# Patient Record
Sex: Male | Born: 1940
Health system: Southern US, Community
[De-identification: ages and names within clinical notes are randomized; demographics above are authoritative.]

## PROBLEM LIST (undated history)

## (undated) ENCOUNTER — Emergency Department (HOSPITAL_COMMUNITY): Admission: EM | Payer: Medicare HMO | Source: Home / Self Care

## (undated) DIAGNOSIS — I2510 Atherosclerotic heart disease of native coronary artery without angina pectoris: Secondary | ICD-10-CM

## (undated) DIAGNOSIS — I4821 Permanent atrial fibrillation: Secondary | ICD-10-CM

## (undated) DIAGNOSIS — I428 Other cardiomyopathies: Secondary | ICD-10-CM

## (undated) DIAGNOSIS — F419 Anxiety disorder, unspecified: Secondary | ICD-10-CM

## (undated) DIAGNOSIS — I340 Nonrheumatic mitral (valve) insufficiency: Secondary | ICD-10-CM

## (undated) DIAGNOSIS — G4730 Sleep apnea, unspecified: Secondary | ICD-10-CM

## (undated) DIAGNOSIS — Z720 Tobacco use: Secondary | ICD-10-CM

## (undated) DIAGNOSIS — Z87442 Personal history of urinary calculi: Secondary | ICD-10-CM

## (undated) DIAGNOSIS — J45909 Unspecified asthma, uncomplicated: Secondary | ICD-10-CM

## (undated) DIAGNOSIS — R001 Bradycardia, unspecified: Secondary | ICD-10-CM

## (undated) DIAGNOSIS — Z8679 Personal history of other diseases of the circulatory system: Secondary | ICD-10-CM

## (undated) DIAGNOSIS — E079 Disorder of thyroid, unspecified: Secondary | ICD-10-CM

## (undated) DIAGNOSIS — N200 Calculus of kidney: Secondary | ICD-10-CM

## (undated) DIAGNOSIS — Z9889 Other specified postprocedural states: Secondary | ICD-10-CM

## (undated) DIAGNOSIS — R51 Headache: Secondary | ICD-10-CM

## (undated) DIAGNOSIS — I10 Essential (primary) hypertension: Secondary | ICD-10-CM

## (undated) DIAGNOSIS — Z86718 Personal history of other venous thrombosis and embolism: Secondary | ICD-10-CM

## (undated) DIAGNOSIS — I77819 Aortic ectasia, unspecified site: Secondary | ICD-10-CM

## (undated) DIAGNOSIS — Z8719 Personal history of other diseases of the digestive system: Secondary | ICD-10-CM

## (undated) DIAGNOSIS — I509 Heart failure, unspecified: Secondary | ICD-10-CM

## (undated) DIAGNOSIS — M1990 Unspecified osteoarthritis, unspecified site: Secondary | ICD-10-CM

## (undated) DIAGNOSIS — IMO0002 Reserved for concepts with insufficient information to code with codable children: Secondary | ICD-10-CM

## (undated) DIAGNOSIS — R519 Headache, unspecified: Secondary | ICD-10-CM

## (undated) DIAGNOSIS — I712 Thoracic aortic aneurysm, without rupture: Secondary | ICD-10-CM

## (undated) DIAGNOSIS — I7121 Aneurysm of the ascending aorta, without rupture: Secondary | ICD-10-CM

## (undated) DIAGNOSIS — I639 Cerebral infarction, unspecified: Secondary | ICD-10-CM

## (undated) DIAGNOSIS — G459 Transient cerebral ischemic attack, unspecified: Secondary | ICD-10-CM

## (undated) DIAGNOSIS — M797 Fibromyalgia: Secondary | ICD-10-CM

## (undated) DIAGNOSIS — J449 Chronic obstructive pulmonary disease, unspecified: Secondary | ICD-10-CM

## (undated) DIAGNOSIS — K219 Gastro-esophageal reflux disease without esophagitis: Secondary | ICD-10-CM

## (undated) DIAGNOSIS — I5022 Chronic systolic (congestive) heart failure: Secondary | ICD-10-CM

## (undated) DIAGNOSIS — I480 Paroxysmal atrial fibrillation: Secondary | ICD-10-CM

## (undated) HISTORY — DX: Other cardiomyopathies: I42.8

## (undated) HISTORY — DX: Personal history of other venous thrombosis and embolism: Z86.718

## (undated) HISTORY — DX: Thoracic aortic aneurysm, without rupture: I71.2

## (undated) HISTORY — DX: Unspecified asthma, uncomplicated: J45.909

## (undated) HISTORY — DX: Sleep apnea, unspecified: G47.30

## (undated) HISTORY — DX: Disorder of thyroid, unspecified: E07.9

## (undated) HISTORY — DX: Chronic obstructive pulmonary disease, unspecified: J44.9

## (undated) HISTORY — DX: Paroxysmal atrial fibrillation: I48.0

## (undated) HISTORY — DX: Reserved for concepts with insufficient information to code with codable children: IMO0002

## (undated) HISTORY — DX: Permanent atrial fibrillation: I48.21

## (undated) HISTORY — PX: COLONOSCOPY: SHX5424

## (undated) HISTORY — DX: Transient cerebral ischemic attack, unspecified: G45.9

## (undated) HISTORY — PX: ANKLE SURGERY: SHX546

## (undated) HISTORY — DX: Nonrheumatic mitral (valve) insufficiency: I34.0

## (undated) HISTORY — DX: Atherosclerotic heart disease of native coronary artery without angina pectoris: I25.10

## (undated) HISTORY — DX: Headache: R51

## (undated) HISTORY — DX: Aneurysm of the ascending aorta, without rupture: I71.21

## (undated) HISTORY — DX: Chronic systolic (congestive) heart failure: I50.22

## (undated) HISTORY — PX: KNEE SURGERY: SHX244

## (undated) HISTORY — PX: SINUS EXPLORATION: SHX5214

## (undated) HISTORY — DX: Bradycardia, unspecified: R00.1

## (undated) HISTORY — DX: Headache, unspecified: R51.9

## (undated) HISTORY — DX: Tobacco use: Z72.0

## (undated) HISTORY — DX: Aortic ectasia, unspecified site: I77.819

---

## 2004-12-01 ENCOUNTER — Ambulatory Visit: Payer: Self-pay | Admitting: Pain Medicine

## 2005-02-18 ENCOUNTER — Ambulatory Visit: Payer: Self-pay | Admitting: Pain Medicine

## 2005-03-10 ENCOUNTER — Inpatient Hospital Stay: Payer: Self-pay | Admitting: General Surgery

## 2005-04-13 ENCOUNTER — Ambulatory Visit: Payer: Self-pay | Admitting: Pain Medicine

## 2005-07-06 ENCOUNTER — Ambulatory Visit: Payer: Self-pay | Admitting: Pain Medicine

## 2005-09-28 ENCOUNTER — Ambulatory Visit: Payer: Self-pay | Admitting: Pain Medicine

## 2006-01-13 ENCOUNTER — Ambulatory Visit: Payer: Self-pay | Admitting: Pain Medicine

## 2007-04-13 ENCOUNTER — Ambulatory Visit: Payer: Self-pay | Admitting: Specialist

## 2009-10-26 ENCOUNTER — Emergency Department: Payer: Self-pay | Admitting: Emergency Medicine

## 2010-06-06 ENCOUNTER — Inpatient Hospital Stay: Payer: Self-pay | Admitting: Internal Medicine

## 2010-12-27 ENCOUNTER — Emergency Department: Payer: Self-pay | Admitting: Emergency Medicine

## 2011-01-02 ENCOUNTER — Inpatient Hospital Stay: Payer: Self-pay | Admitting: Internal Medicine

## 2011-01-03 ENCOUNTER — Encounter: Payer: Self-pay | Admitting: Cardiovascular Disease

## 2011-01-04 ENCOUNTER — Encounter: Payer: Self-pay | Admitting: Cardiovascular Disease

## 2011-01-04 DIAGNOSIS — R079 Chest pain, unspecified: Secondary | ICD-10-CM | POA: Insufficient documentation

## 2011-01-04 DIAGNOSIS — R0789 Other chest pain: Secondary | ICD-10-CM | POA: Insufficient documentation

## 2011-01-05 ENCOUNTER — Encounter: Payer: Self-pay | Admitting: Cardiovascular Disease

## 2011-01-07 ENCOUNTER — Ambulatory Visit: Admit: 2011-01-07 | Payer: Self-pay

## 2011-01-07 ENCOUNTER — Ambulatory Visit: Admit: 2011-01-07 | Payer: Self-pay | Admitting: Physician Assistant

## 2011-01-07 ENCOUNTER — Ambulatory Visit: Admit: 2011-01-07 | Payer: Self-pay | Admitting: Cardiovascular Disease

## 2011-01-14 ENCOUNTER — Ambulatory Visit: Admit: 2011-01-14 | Payer: Self-pay

## 2011-01-21 NOTE — Miscellaneous (Signed)
  Clinical Lists Changes  Problems: Added new problem of CHEST PAIN UNSPECIFIED (ICD-786.50) Orders: Added new Referral order of Treadmill (Treadmill) - Signed

## 2011-01-21 NOTE — Miscellaneous (Addendum)
Summary: Myoview order  Clinical Lists Changes  Orders: Added new Referral order of Nuclear Stress Test (Nuc Stress Test) - Signed  Appended Document: Myoview order pt cancelled myoview.

## 2011-02-07 ENCOUNTER — Emergency Department: Payer: Self-pay | Admitting: Emergency Medicine

## 2011-02-09 ENCOUNTER — Ambulatory Visit: Payer: Self-pay | Admitting: Emergency Medicine

## 2011-02-11 LAB — PATHOLOGY REPORT

## 2011-04-21 ENCOUNTER — Encounter: Payer: Self-pay | Admitting: Cardiovascular Disease

## 2011-04-21 ENCOUNTER — Ambulatory Visit: Payer: Self-pay | Admitting: Cardiovascular Disease

## 2011-05-05 ENCOUNTER — Ambulatory Visit (INDEPENDENT_AMBULATORY_CARE_PROVIDER_SITE_OTHER): Payer: Medicare Other | Admitting: Cardiovascular Disease

## 2011-05-05 ENCOUNTER — Encounter: Payer: Self-pay | Admitting: Cardiovascular Disease

## 2011-05-05 DIAGNOSIS — I2789 Other specified pulmonary heart diseases: Secondary | ICD-10-CM

## 2011-05-05 DIAGNOSIS — I272 Pulmonary hypertension, unspecified: Secondary | ICD-10-CM | POA: Insufficient documentation

## 2011-05-05 DIAGNOSIS — R079 Chest pain, unspecified: Secondary | ICD-10-CM

## 2011-05-05 DIAGNOSIS — I340 Nonrheumatic mitral (valve) insufficiency: Secondary | ICD-10-CM | POA: Insufficient documentation

## 2011-05-05 DIAGNOSIS — I059 Rheumatic mitral valve disease, unspecified: Secondary | ICD-10-CM

## 2011-05-05 DIAGNOSIS — R0602 Shortness of breath: Secondary | ICD-10-CM | POA: Insufficient documentation

## 2011-05-05 DIAGNOSIS — I341 Nonrheumatic mitral (valve) prolapse: Secondary | ICD-10-CM | POA: Insufficient documentation

## 2011-05-05 DIAGNOSIS — J449 Chronic obstructive pulmonary disease, unspecified: Secondary | ICD-10-CM

## 2011-05-05 MED ORDER — ISOSORBIDE MONONITRATE ER 30 MG PO TB24: 15.0000 mg | ORAL_TABLET | Freq: Every day | ORAL | Status: DC

## 2011-05-05 MED ORDER — FUROSEMIDE 20 MG PO TABS: 20.0000 mg | ORAL_TABLET | Freq: Two times a day (BID) | ORAL | Status: DC

## 2011-05-05 MED ORDER — METOPROLOL TARTRATE 12.5 MG HALF TABLET: 12.5000 mg | ORAL_TABLET | Freq: Two times a day (BID) | ORAL | Status: DC

## 2011-05-05 NOTE — Assessment & Plan Note (Addendum)
He has had several episodesof severe SOB consistent. These are concerning for flash pulmonary edema from his mitral regurg. He is not currently on lasix or any medical management. We will start lasix BID with metoprolol and low dose imdur. We have suggested we check a BMP next week.

## 2011-05-05 NOTE — Assessment & Plan Note (Signed)
His echo shows at least moderate pulmonary HTN, if not moderate to severely elevated RVSP. This could be from his COPD or underlying valve disease. He will start lasix as detailed.

## 2011-05-05 NOTE — Progress Notes (Signed)
Patient ID: Michael Delacruz, male    DOB: 08-12-1941, 70 y.o.   MRN: [id number]  HPI Comments: Michael Delacruz is a 70 year old gentleman with a reported history of rheumatic fever as a child in his late teenage years, a murmur for several decades, long smoking history for 50 years with underlying COPD who is not on medications currently, with worsening shortness of breath this year, severe mitral valve regurgitation, pulmonary hypertension on echocardiogram who presents for second opinion.  He states that he is scheduled for surgery in several days time at [facility]. He is very nervous about the procedure as the details of the surgery were very nonspecific. He was told by the surgical team that he was uncertain whether he minimal surgical approach could be used versus a sternotomy. He would like a more definitive answer for proceeding with surgery.  He's had several episodes of severe shortness of breath. One was in April and the other several weeks prior to that. He has had other episodes of more moderate shortness of breath. These episodes have been worse over the past year. He reports having very mild lower extremity edema. No significant cough. He stopped smoking some time ago.  He had a cardiac catheterization by Dr. Park Breed that showed severe mitral valve regurgitation, 30% mid and 30% proximal LAD disease, 50% mid circumflex disease ejection fraction 50%.  Echocardiogram read by myself detailed ejection fraction 55%, severe acentric mitral valve regurgitation with mitral valve prolapse, elevated right ventricular systolic pressures concerning for moderate pulmonary hypertension ( low tricuspid valve regurgitation jet was not well measured)  EKG shows normal sinus rhythm with rate 66 beats per minute with LVH     Review of Systems  Constitutional: Negative.   HENT: Negative.   Eyes: Negative.   Respiratory: Positive for shortness of breath.   Cardiovascular: Positive for leg swelling.    Gastrointestinal: Negative.   Musculoskeletal: Negative.   Skin: Negative.   Neurological: Negative.   Hematological: Negative.   Psychiatric/Behavioral: Negative.   All other systems reviewed and are negative.   BP 122/90  Pulse 63  Ht 6' (1.829 m)  Wt 206 lb (93.441 kg)  BMI 27.94 kg/m2   Physical Exam  Nursing note and vitals reviewed. Constitutional: He is oriented to person, place, and time. He appears well-developed and well-nourished.  HENT:  Head: Normocephalic.  Nose: Nose normal.  Mouth/Throat: Oropharynx is clear and moist.  Eyes: Conjunctivae are normal. Pupils are equal, round, and reactive to light.  Neck: Normal range of motion. Neck supple. No JVD present.  Cardiovascular: Normal rate, regular rhythm, S1 normal, S2 normal and intact distal pulses.  Exam reveals no gallop and no friction rub.   Murmur heard.  Systolic murmur is present with a grade of 3/6  Pulmonary/Chest: Effort normal and breath sounds normal. No respiratory distress. He has no wheezes. He has no rales. He exhibits no tenderness.  Abdominal: Soft. Bowel sounds are normal. He exhibits no distension. There is no tenderness.  Musculoskeletal: Normal range of motion. He exhibits no edema and no tenderness.  Lymphadenopathy:    He has no cervical adenopathy.  Neurological: He is alert and oriented to person, place, and time. Coordination normal.  Skin: Skin is warm and dry. No rash noted. No erythema.  Psychiatric: He has a normal mood and affect. His behavior is normal. Judgment and thought content normal.           Assessment and Plan

## 2011-05-05 NOTE — Assessment & Plan Note (Addendum)
He does have severe COPD, has smoked for 50 years. He is not currently on any inhalers though could benefit from additional workup.

## 2011-05-05 NOTE — Patient Instructions (Signed)
START Metoprolol 12.5 mg twice daily. START Lasix 20mg  twice daily. DECREASE Fluid intake. START eating potassium rich foods (Bananas, citrus fruits) START Isosorbide 30mg  1/2 tablet daily. Your physician recommends that you return for lab work in: 1 week (BMP) Your physician recommends that you schedule a follow-up appointment in: 2 weeks

## 2011-05-05 NOTE — Assessment & Plan Note (Signed)
Echo in January suggested severe MR. Cardiac cath by Dr. Park Breed suggest severe MR. I have suggested as he is reluctant at this time to proceed with surgery without knowing the pathology of the valve and which surgical approach will be used, that he have a TEE. This could be done at [facility] or [facility] I have asked him to talk with Dr. Silvestre Mesi. In the meantime, I will try to minimize his episodes of SOB by medical management.

## 2011-05-12 ENCOUNTER — Other Ambulatory Visit (INDEPENDENT_AMBULATORY_CARE_PROVIDER_SITE_OTHER): Payer: Medicare Other | Admitting: *Deleted

## 2011-05-12 DIAGNOSIS — R079 Chest pain, unspecified: Secondary | ICD-10-CM

## 2011-05-12 LAB — BASIC METABOLIC PANEL
BUN: 14 mg/dL (ref 6–23)
CO2: 21 mEq/L (ref 19–32)
Calcium: 9.8 mg/dL (ref 8.4–10.5)
Chloride: 104 mEq/L (ref 96–112)
Creat: 1.19 mg/dL (ref 0.40–1.50)
Glucose, Bld: 130 mg/dL — ABNORMAL HIGH (ref 70–99)
Potassium: 3.9 mEq/L (ref 3.5–5.3)
Sodium: 139 mEq/L (ref 135–145)

## 2011-05-18 ENCOUNTER — Encounter: Payer: Self-pay | Admitting: Cardiovascular Disease

## 2011-05-19 ENCOUNTER — Encounter: Payer: Self-pay | Admitting: Cardiovascular Disease

## 2011-05-19 ENCOUNTER — Ambulatory Visit (INDEPENDENT_AMBULATORY_CARE_PROVIDER_SITE_OTHER): Payer: Medicare Other | Admitting: Cardiovascular Disease

## 2011-05-19 DIAGNOSIS — R0602 Shortness of breath: Secondary | ICD-10-CM

## 2011-05-19 DIAGNOSIS — J449 Chronic obstructive pulmonary disease, unspecified: Secondary | ICD-10-CM

## 2011-05-19 DIAGNOSIS — I059 Rheumatic mitral valve disease, unspecified: Secondary | ICD-10-CM

## 2011-05-19 DIAGNOSIS — I272 Pulmonary hypertension, unspecified: Secondary | ICD-10-CM

## 2011-05-19 DIAGNOSIS — I2789 Other specified pulmonary heart diseases: Secondary | ICD-10-CM

## 2011-05-19 DIAGNOSIS — I340 Nonrheumatic mitral (valve) insufficiency: Secondary | ICD-10-CM

## 2011-05-19 DIAGNOSIS — R42 Dizziness and giddiness: Secondary | ICD-10-CM

## 2011-05-19 DIAGNOSIS — J4489 Other specified chronic obstructive pulmonary disease: Secondary | ICD-10-CM

## 2011-05-19 DIAGNOSIS — R079 Chest pain, unspecified: Secondary | ICD-10-CM

## 2011-05-19 DIAGNOSIS — I341 Nonrheumatic mitral (valve) prolapse: Secondary | ICD-10-CM

## 2011-05-19 NOTE — Patient Instructions (Signed)
You are doing well. Hold your isosorbide for now Monitor your weight. If you get to 195 pounds, call to check your kidney function Please call us if you have new issues that need to be addressed before your next appt.  We will call you for a follow up Appt. In two weeks

## 2011-05-19 NOTE — Progress Notes (Signed)
   Patient ID: Michael Delacruz, male    DOB: [birth year], 70 y.o.   MRN: [id number]  HPI Comments: Michael Delacruz is a 70 year old gentleman with a reported history of rheumatic fever as a child in his late teenage years, a murmur for several decades, long smoking history for 50 years with underlying COPD who is not on medications currently, with worsening shortness of breath this year, severe mitral valve regurgitation, pulmonary hypertension on echocardiogram who presents for Routine followup.  On his last clinic visit, we started medical management for his significant mitral valve regurgitation, COPD and pulmonary hypertension. He was having episodes of what sounded like flash pulmonary edema. Currently, after Lasix, metoprolol, Imdur, he feels better with no further episodes of significant shortness of breath. He reports walking on a regular basis, sometimes over 2 miles with no significant problems. He does have occasional episodes of profound fatigue and occasional dizziness in the morning after he takes his medications. Sometimes he takes Lasix b.i.d. And sometimes t.i.d. Depending on his breathing.   No significant cough. He stopped smoking some time ago.  He had a cardiac catheterization  that showed severe mitral valve regurgitation, 30% mid and 30% proximal LAD disease, 50% mid circumflex disease ejection fraction 50%.  Echocardiogram  with ejection fraction 55%, severe eccentric mitral valve regurgitation with mitral valve prolapse, elevated right ventricular systolic pressures concerning for moderate pulmonary hypertension ( low tricuspid valve regurgitation jet was not well measured)  Old EKG shows normal sinus rhythm with rate 66 beats per minute with LVH      Review of Systems  Constitutional: Positive for fatigue.  HENT: Negative.   Eyes: Negative.   Respiratory: Positive for shortness of breath.   Cardiovascular: Negative.   Gastrointestinal: Negative.   Musculoskeletal: Negative.    Skin: Negative.   Neurological: Positive for dizziness and light-headedness.  Hematological: Negative.   Psychiatric/Behavioral: Negative.   All other systems reviewed and are negative.    BP 98/68  Pulse 76  Ht 6\' 1"  (1.854 m)  Wt 199 lb (90.266 kg)  BMI 26.25 kg/m2   Physical Exam  Nursing note and vitals reviewed. Constitutional: He is oriented to person, place, and time. He appears well-developed and well-nourished.  HENT:  Head: Normocephalic.  Nose: Nose normal.  Mouth/Throat: Oropharynx is clear and moist.  Eyes: Conjunctivae are normal. Pupils are equal, round, and reactive to light.  Neck: Normal range of motion. Neck supple. No JVD present.  Cardiovascular: Normal rate, regular rhythm, S1 normal, S2 normal and intact distal pulses.  Exam reveals no gallop and no friction rub.   Murmur heard.  Crescendo systolic murmur is present  Pulmonary/Chest: Effort normal and breath sounds normal. No respiratory distress. He has no wheezes. He has no rales. He exhibits no tenderness.  Abdominal: Soft. Bowel sounds are normal. He exhibits no distension. There is no tenderness.  Musculoskeletal: Normal range of motion. He exhibits no edema and no tenderness.  Lymphadenopathy:    He has no cervical adenopathy.  Neurological: He is alert and oriented to person, place, and time. Coordination normal.  Skin: Skin is warm and dry. No rash noted. No erythema.  Psychiatric: He has a normal mood and affect. His behavior is normal. Judgment and thought content normal.           Assessment and Plan

## 2011-05-19 NOTE — Assessment & Plan Note (Signed)
He does have a long history of smoking. His breathing has improved with Lasix. We will hold on any further management of his COPD at this time.

## 2011-05-19 NOTE — Assessment & Plan Note (Signed)
Recent episodes of dizziness after taking his medications in the morning is likely secondary to hypotension. He will hold his isosorbide for now and contact us next week by phone to let us know if his symptoms have improved.

## 2011-05-19 NOTE — Assessment & Plan Note (Signed)
Symptoms of shortness of breath and edema have improved on Lasix and beta blocker. We have suggested he continue on b.i.d. Or t.i.d. Lasix depending on his breathing. He will watch his weight. If he drops his weight to 195 pounds, we will check a basic panel.

## 2011-05-19 NOTE — Assessment & Plan Note (Signed)
Etiology of pulmonary hypertension likely multifactorial. He has fluid overload from his valve regurgitation and underlying COPD.

## 2011-05-31 ENCOUNTER — Ambulatory Visit (INDEPENDENT_AMBULATORY_CARE_PROVIDER_SITE_OTHER): Payer: Medicare Other | Admitting: Cardiovascular Disease

## 2011-05-31 ENCOUNTER — Encounter: Payer: Self-pay | Admitting: Cardiovascular Disease

## 2011-05-31 DIAGNOSIS — I272 Pulmonary hypertension, unspecified: Secondary | ICD-10-CM

## 2011-05-31 DIAGNOSIS — I059 Rheumatic mitral valve disease, unspecified: Secondary | ICD-10-CM

## 2011-05-31 DIAGNOSIS — R0602 Shortness of breath: Secondary | ICD-10-CM

## 2011-05-31 DIAGNOSIS — I341 Nonrheumatic mitral (valve) prolapse: Secondary | ICD-10-CM

## 2011-05-31 DIAGNOSIS — I2789 Other specified pulmonary heart diseases: Secondary | ICD-10-CM

## 2011-05-31 DIAGNOSIS — J449 Chronic obstructive pulmonary disease, unspecified: Secondary | ICD-10-CM

## 2011-05-31 DIAGNOSIS — R079 Chest pain, unspecified: Secondary | ICD-10-CM

## 2011-05-31 DIAGNOSIS — R42 Dizziness and giddiness: Secondary | ICD-10-CM

## 2011-05-31 DIAGNOSIS — I340 Nonrheumatic mitral (valve) insufficiency: Secondary | ICD-10-CM

## 2011-05-31 LAB — BASIC METABOLIC PANEL
BUN: 13 mg/dL (ref 6–23)
CO2: 26 mEq/L (ref 19–32)
Calcium: 10 mg/dL (ref 8.4–10.5)
Chloride: 104 mEq/L (ref 96–112)
Creat: 1.1 mg/dL (ref 0.50–1.35)
Glucose, Bld: 102 mg/dL — ABNORMAL HIGH (ref 70–99)
Potassium: 4.2 mEq/L (ref 3.5–5.3)
Sodium: 142 mEq/L (ref 135–145)

## 2011-05-31 MED ORDER — ENALAPRIL MALEATE 5 MG PO TABS: 5.0000 mg | ORAL_TABLET | Freq: Every day | ORAL | Status: DC

## 2011-05-31 NOTE — Patient Instructions (Addendum)
You are doing well. Please start enalapril 2.5 daily, increase to 5 mg daily after two week.  Please call us if you have new issues that need to be addressed before your next appt.  We will call you for a follow up Appt. In 3 to 4 weeks

## 2011-05-31 NOTE — Assessment & Plan Note (Signed)
50 year smoking hx, currently not on inhalers.

## 2011-05-31 NOTE — Progress Notes (Signed)
   Patient ID: Michael Delacruz, male    DOB: Feb 01, 1941, 70 y.o.   MRN: [id number]  HPI Comments: [name] is a 70 year old gentleman with a reported history of rheumatic fever as a child in his late teenage years, a murmur for several decades, long smoking history for 50 years with underlying COPD, with worsening shortness of breath this year, severe mitral valve regurgitation, pulmonary hypertension on echocardiogram who presents for Routine followup.  When he first presented to the clinic, he was having episodes of what sounded like flash pulmonary edema. Currently, after Lasix, metoprolol, he feels better with no further episodes of significant shortness of breath. His blood pressure dropped on imdur and he had fatigue. He reports walking on a regular basis, sometimes over 3 miles with no significant problems.  Sometimes he takes Lasix b.i.d. And sometimes t.i.d. Depending on his breathing. He has rare episodes of chest pain that he believes in a hiatal hernia. He has been lifting weights and walking several times a week.  No significant cough. He stopped smoking some time ago.  He had a cardiac catheterization  that showed severe mitral valve regurgitation, 30% mid and 30% proximal LAD disease, 50% mid circumflex disease ejection fraction 50%.  Echocardiogram  with ejection fraction 55%, severe eccentric mitral valve regurgitation with mitral valve prolapse, elevated right ventricular systolic pressures concerning for moderate pulmonary hypertension (  tricuspid valve regurgitation jet was not well measured)  Old EKG shows normal sinus rhythm with rate 66 beats per minute with LVH      Review of Systems  Constitutional: Negative.   HENT: Negative.   Eyes: Negative.   Respiratory: Positive for shortness of breath.   Cardiovascular: Positive for chest pain.  Gastrointestinal: Negative.   Musculoskeletal: Negative.   Skin: Negative.   Neurological: Negative.   Hematological: Negative.     Psychiatric/Behavioral: Negative.   All other systems reviewed and are negative.    BP 119/76  Pulse 52  Ht 6\' 1"  (1.854 m)  Wt 201 lb (91.173 kg)  BMI 26.52 kg/m2   Physical Exam  Nursing note and vitals reviewed. Constitutional: He is oriented to person, place, and time. He appears well-developed and well-nourished.  HENT:  Head: Normocephalic.  Nose: Nose normal.  Mouth/Throat: Oropharynx is clear and moist.  Eyes: Conjunctivae are normal. Pupils are equal, round, and reactive to light.  Neck: Normal range of motion. Neck supple. No JVD present.  Cardiovascular: Normal rate, regular rhythm, S1 normal, S2 normal and intact distal pulses.  Exam reveals no gallop and no friction rub.   Murmur heard.  Crescendo systolic murmur is present with a grade of 3/6  Pulmonary/Chest: Effort normal and breath sounds normal. No respiratory distress. He has no wheezes. He has no rales. He exhibits no tenderness.  Abdominal: Soft. Bowel sounds are normal. He exhibits no distension. There is no tenderness.  Musculoskeletal: Normal range of motion. He exhibits no edema and no tenderness.  Lymphadenopathy:    He has no cervical adenopathy.  Neurological: He is alert and oriented to person, place, and time. Coordination normal.  Skin: Skin is warm and dry. No rash noted. No erythema.  Psychiatric: He has a normal mood and affect. His behavior is normal. Judgment and thought content normal.           Assessment and Plan

## 2011-05-31 NOTE — Assessment & Plan Note (Signed)
He is feeling much better on lasix and metoprolol. He is exercising on a regular basis. He has a script for enalapril from Dr. Welton Flakes. We have suggested that he could start this, with 2.5 mg for a week or two before titrating to 5 mg total.

## 2011-05-31 NOTE — Assessment & Plan Note (Signed)
Chest pain episodes are likely noncardiac as he has had a recent cardiac cath with noncritical disease. He reports having a hiatal hernia. Symptoms come on at rest.

## 2011-05-31 NOTE — Assessment & Plan Note (Signed)
SOB is likely multifactorial, including COPD and underlying mitral valve regurgitation. Symptoms are mild. He does not want inhalers.

## 2011-06-02 ENCOUNTER — Ambulatory Visit: Payer: Medicare Other | Admitting: Cardiovascular Disease

## 2011-06-03 ENCOUNTER — Encounter: Payer: Self-pay | Admitting: *Deleted

## 2011-06-07 ENCOUNTER — Encounter: Payer: Self-pay | Admitting: Cardiovascular Disease

## 2011-06-17 ENCOUNTER — Other Ambulatory Visit: Payer: Self-pay | Admitting: Cardiovascular Disease

## 2011-06-17 MED ORDER — FUROSEMIDE 20 MG PO TABS: 20.0000 mg | ORAL_TABLET | Freq: Two times a day (BID) | ORAL | Status: DC

## 2011-06-17 MED ORDER — METOPROLOL TARTRATE 12.5 MG HALF TABLET: 12.5000 mg | ORAL_TABLET | Freq: Two times a day (BID) | ORAL | Status: DC

## 2011-06-17 MED ORDER — ENALAPRIL MALEATE 5 MG PO TABS: 5.0000 mg | ORAL_TABLET | Freq: Every day | ORAL | Status: DC

## 2011-06-17 NOTE — Telephone Encounter (Signed)
Only one Metoprolol tablet left.

## 2011-06-21 ENCOUNTER — Ambulatory Visit: Payer: Medicare Other | Admitting: Cardiovascular Disease

## 2011-07-02 ENCOUNTER — Encounter: Payer: Self-pay | Admitting: Cardiovascular Disease

## 2011-07-02 ENCOUNTER — Ambulatory Visit (INDEPENDENT_AMBULATORY_CARE_PROVIDER_SITE_OTHER): Payer: Medicare Other | Admitting: Cardiovascular Disease

## 2011-07-02 DIAGNOSIS — R079 Chest pain, unspecified: Secondary | ICD-10-CM

## 2011-07-02 DIAGNOSIS — R0602 Shortness of breath: Secondary | ICD-10-CM

## 2011-07-02 DIAGNOSIS — I059 Rheumatic mitral valve disease, unspecified: Secondary | ICD-10-CM

## 2011-07-02 DIAGNOSIS — J449 Chronic obstructive pulmonary disease, unspecified: Secondary | ICD-10-CM

## 2011-07-02 DIAGNOSIS — J4489 Other specified chronic obstructive pulmonary disease: Secondary | ICD-10-CM

## 2011-07-02 DIAGNOSIS — I341 Nonrheumatic mitral (valve) prolapse: Secondary | ICD-10-CM

## 2011-07-02 DIAGNOSIS — I340 Nonrheumatic mitral (valve) insufficiency: Secondary | ICD-10-CM

## 2011-07-02 NOTE — Progress Notes (Signed)
Patient ID: Michael Delacruz, male    DOB: Aug 18, 1941, 70 y.o.   MRN: [id number]  HPI Comments: Michael Delacruz is a 70 year old gentleman with a reported history of rheumatic fever as a child in his late teenage years, a murmur for several decades, long smoking history for 50 years with underlying COPD, with worsening shortness of breath this year, severe mitral valve regurgitation, pulmonary hypertension on echocardiogram who presents for Routine followup.  He reports that he is doing well. He denies any significant shortness of breath. He has been walking up to 3-4 miles at a time typically in the nighttime. He walks at a moderate pace. He continues to have occasional chest pain radiating to his left arm which he has had for years. He attributes this to hiatal hernia. Symptoms are typically resolved after drinking water. Overall he is very happy with his decision to delay surgery.  He has decreased his Lasix to 40 mg in the morning only from b.i.d.   He had a cardiac catheterization  that showed severe mitral valve regurgitation, 30% mid and 30% proximal LAD disease, 50% mid circumflex disease ejection fraction 50%.  Echocardiogram  with ejection fraction 55%, severe eccentric mitral valve regurgitation with mitral valve prolapse, elevated right ventricular systolic pressures concerning for moderate pulmonary hypertension (  tricuspid valve regurgitation jet was not well measured)  Old EKG shows normal sinus rhythm with rate 66 beats per minute with LVH   Outpatient Encounter Prescriptions as of 07/02/2011  Medication Sig Dispense Refill  . ALPRAZolam (XANAX) 0.25 MG tablet Take 0.25 mg by mouth 2 (two) times daily.       [name] aspirin 81 MG EC tablet Take 81 mg by mouth daily.        . clobetasol (TEMOVATE) 0.05 % cream as needed.      . enalapril (VASOTEC) 5 MG tablet Take 1 tablet (5 mg total) by mouth daily.  30 tablet  6  . furosemide (LASIX) 20 MG tablet Take 40 mg by mouth daily.        .  metoprolol tartrate (LOPRESSOR) 12.5 mg TABS Take 12.5 mg by mouth at bedtime.        [name] omeprazole (PRILOSEC) 20 MG capsule Take 20 mg by mouth every other day.           Review of Systems  Constitutional: Negative.   HENT: Negative.   Eyes: Negative.   Respiratory: Negative.   Cardiovascular: Negative.   Gastrointestinal: Negative.   Musculoskeletal: Negative.   Skin: Negative.   Neurological: Negative.   Hematological: Negative.   Psychiatric/Behavioral: Negative.   All other systems reviewed and are negative.    BP 101/67  Pulse 57  Ht 6\' 1"  (1.854 m)  Wt 200 lb (90.719 kg)  BMI 26.39 kg/m2   Physical Exam  Nursing note and vitals reviewed. Constitutional: He is oriented to person, place, and time. He appears well-developed and well-nourished.  HENT:  Head: Normocephalic.  Nose: Nose normal.  Mouth/Throat: Oropharynx is clear and moist.  Eyes: Conjunctivae are normal. Pupils are equal, round, and reactive to light.  Neck: Normal range of motion. Neck supple. No JVD present.  Cardiovascular: Normal rate, regular rhythm, S1 normal, S2 normal and intact distal pulses.  Exam reveals no gallop and no friction rub.   Murmur heard.  Crescendo systolic murmur is present with a grade of 2/6  Pulmonary/Chest: Effort normal and breath sounds normal. No respiratory distress. He has no wheezes. He has no rales.  He exhibits no tenderness.  Abdominal: Soft. Bowel sounds are normal. He exhibits no distension. There is no tenderness.  Musculoskeletal: Normal range of motion. He exhibits no edema and no tenderness.  Lymphadenopathy:    He has no cervical adenopathy.  Neurological: He is alert and oriented to person, place, and time. Coordination normal.  Skin: Skin is warm and dry. No rash noted. No erythema.  Psychiatric: He has a normal mood and affect. His behavior is normal. Judgment and thought content normal.           Assessment and Plan

## 2011-07-02 NOTE — Patient Instructions (Signed)
You are doing well. No medication changes were made. Please call us if you have new issues that need to be addressed before your next appt.  We will call you for a follow up Appt. In 6 months  

## 2011-07-03 NOTE — Assessment & Plan Note (Signed)
Shortness of breath has improved with diuresis and with his regular exercise. He may have underlying COPD.  Symptoms are mild. He does not want inhalers.

## 2011-07-03 NOTE — Assessment & Plan Note (Signed)
He is feeling much better on lasix and metoprolol. He is exercising on a regular basis.

## 2011-07-03 NOTE — Assessment & Plan Note (Signed)
Chest pain episodes are  noncardiac as he has had a recent cardiac cath with noncritical disease. He reports having a hiatal hernia. Symptoms come on at rest. Relieved with drinking water.

## 2011-07-03 NOTE — Assessment & Plan Note (Signed)
50 year smoking hx, currently not on inhalers.  

## 2011-07-26 ENCOUNTER — Telehealth: Payer: Self-pay | Admitting: *Deleted

## 2011-07-27 ENCOUNTER — Ambulatory Visit (INDEPENDENT_AMBULATORY_CARE_PROVIDER_SITE_OTHER): Payer: Medicare Other | Admitting: *Deleted

## 2011-07-27 ENCOUNTER — Encounter: Payer: Self-pay | Admitting: *Deleted

## 2011-07-27 DIAGNOSIS — I341 Nonrheumatic mitral (valve) prolapse: Secondary | ICD-10-CM

## 2011-07-27 DIAGNOSIS — I059 Rheumatic mitral valve disease, unspecified: Secondary | ICD-10-CM

## 2011-07-27 DIAGNOSIS — R079 Chest pain, unspecified: Secondary | ICD-10-CM

## 2011-07-27 NOTE — Progress Notes (Signed)
Pt in today for EKG, please refer to phone note 07/26/11. Pt was concerned with "heart stopping," after listening with stethoscope at home and heart stopped for 2 sec. Explained to pt that this sounds like a sinus irregularity or could be PAC or PVC. Pt does have h/o MR. Pt has remained asymptomatic, just concerned with what his rhythm showed. Notified pt that his EKG shows NSR, unchanged from previous EKG. Advised he monitor frequency of episodes and call if he feels he wants a holter monitor. He states he does not at this time, but will call if symptoms occur or if "extra beats" occur more frequently.

## 2011-07-29 NOTE — Telephone Encounter (Signed)
Opened in error

## 2011-08-10 ENCOUNTER — Telehealth: Payer: Self-pay | Admitting: *Deleted

## 2011-08-10 ENCOUNTER — Encounter (INDEPENDENT_AMBULATORY_CARE_PROVIDER_SITE_OTHER): Payer: Medicare Other | Admitting: *Deleted

## 2011-08-10 DIAGNOSIS — R002 Palpitations: Secondary | ICD-10-CM

## 2011-08-10 DIAGNOSIS — I341 Nonrheumatic mitral (valve) prolapse: Secondary | ICD-10-CM

## 2011-08-10 DIAGNOSIS — R079 Chest pain, unspecified: Secondary | ICD-10-CM

## 2011-08-10 NOTE — Telephone Encounter (Signed)
Pt called stating this AM he felt a "punch in the chest, about 3 times," and at the time he was sitting at a desk at rest. Pt denies any changes in meds/activity, other than washing his car yesterday afternoon and became a "little stressed afterwards but that is it." Pt is asymptomatic at this time, other than a little tired. Pt in recently for nurse visit for feelings of irregular HR, ekg normal at the time. During visit Dr. Mariah Milling stated if symptoms continued we could order a holter monitor. Pt will come in today for 48 hr holter monitor, and in the meantime if develops worsening symptoms or feels unstable will call 911 or have someone take him to ER.

## 2011-08-13 ENCOUNTER — Telehealth: Payer: Self-pay | Admitting: *Deleted

## 2011-08-13 NOTE — Telephone Encounter (Signed)
Received Michael Delacruz's Holter report, showing multiple ectopic beats, SVT, 5 beat run of NSVT, via fax after Windell Moulding called with abnormal findings. Discussed with Dr. Mariah Milling. Per MD, will have Michael Delacruz incr metoprolol to 12.5 in AM as well as his normal dose of 12.5 in PM. Michael Delacruz's HR normally in low 60s high 50s, so not much incr. Michael Delacruz will try this throughout weekend, but if unsuccessful, will f/u with EP. I have already scheduled Michael Delacruz to see Dr. Ladona Ridgel 9/14 first available. Told Michael Delacruz if he needs to be seen sooner, we can schedule in [place]. Michael Delacruz ok with this. Also advised Michael Delacruz to cut out any stimulants, coffee/caffeine/stress if possible to see if symptoms improve. Michael Delacruz will call me back early next week with update.

## 2011-09-03 ENCOUNTER — Ambulatory Visit (INDEPENDENT_AMBULATORY_CARE_PROVIDER_SITE_OTHER): Payer: Medicare Other | Admitting: Internal Medicine

## 2011-09-03 ENCOUNTER — Encounter: Payer: Self-pay | Admitting: Internal Medicine

## 2011-09-03 DIAGNOSIS — I340 Nonrheumatic mitral (valve) insufficiency: Secondary | ICD-10-CM

## 2011-09-03 DIAGNOSIS — I341 Nonrheumatic mitral (valve) prolapse: Secondary | ICD-10-CM

## 2011-09-03 DIAGNOSIS — R002 Palpitations: Secondary | ICD-10-CM

## 2011-09-03 DIAGNOSIS — R0602 Shortness of breath: Secondary | ICD-10-CM

## 2011-09-03 DIAGNOSIS — I059 Rheumatic mitral valve disease, unspecified: Secondary | ICD-10-CM

## 2011-09-03 DIAGNOSIS — R42 Dizziness and giddiness: Secondary | ICD-10-CM

## 2011-09-03 NOTE — Progress Notes (Signed)
HPI [name] is referred today for followup. He is a pleasant 70 yo man with a h/o palpitations, mild CHF, pulmonary HTN, and mitral regurgitation. He is able to walk without stopping. He has mild palpitations and on cardiac monitoring, he has had NSVT and NSSVT. He has never had syncope. He describes sudden vision loss in his left eye which has partially improved though much of his central vision is lost.  Allergies  Allergen Reactions  . Atropine   . Combigan (Brimonidine Tartrate-Timolol)      Current Outpatient Prescriptions  Medication Sig Dispense Refill  . ALPRAZolam (XANAX) 0.25 MG tablet Take 0.25 mg by mouth 2 (two) times daily.       [name] aspirin 81 MG EC tablet Take 81 mg by mouth daily.        . clobetasol (TEMOVATE) 0.05 % cream as needed.      . enalapril (VASOTEC) 5 MG tablet Take 1 tablet (5 mg total) by mouth daily.  30 tablet  6  . furosemide (LASIX) 20 MG tablet Take 40 mg by mouth daily.       . metoprolol tartrate (LOPRESSOR) 12.5 mg TABS Take 12.5 mg by mouth at bedtime.        [name] omeprazole (PRILOSEC) 20 MG capsule Take 20 mg by mouth every other day.          Past Medical History  Diagnosis Date  . COPD (chronic obstructive pulmonary disease)   . History of blood clots     eye     ROS:   All systems reviewed and negative except as noted in the HPI.   Past Surgical History  Procedure Date  . Ankle surgery      History reviewed. No pertinent family history.   History   Social History  . Marital Status: Married    Spouse Name: N/A    Number of Children: N/A  . Years of Education: N/A   Occupational History  . Not on file.   Social History Main Topics  . Smoking status: Current Some Day Smoker -- 1.0 packs/day for 52 years    Types: Cigarettes  . Smokeless tobacco: Not on file  . Alcohol Use: No  . Drug Use: No  . Sexually Active: Not on file   Other Topics Concern  . Not on file   Social History Narrative  . No narrative on file      BP 118/74  Pulse 65  Ht 6\' 1"  (1.854 m)  Wt 195 lb (88.451 kg)  BMI 25.73 kg/m2  Physical Exam:  Well appearing NAD HEENT: Unremarkable Neck:  No JVD, no thyromegally Lymphatics:  No adenopathy Back:  No CVA tenderness Lungs:  Clear wit no wheezes. HEART:  Regular rate rhythm, 4/6 systolic murmur at left lower sternal border. Abd:  soft, positive bowel sounds, no organomegally, no rebound, no guarding Ext:  2 plus pulses, no edema, no cyanosis, no clubbing Skin:  No rashes no nodules Neuro:  CN II through XII intact, motor grossly intact  EKG NSR with LVH  Assess/Plan:

## 2011-09-03 NOTE — Assessment & Plan Note (Signed)
I discussed the etiology of his symptoms. I have recommended a period of watchful waiting.

## 2011-09-03 NOTE — Assessment & Plan Note (Signed)
His symptoms are mild but pulmonary HTN and sob suggest that he may well need surgery sooner than later. He notes that Dr. Mariah Milling has recommended a TEE and I would concur.

## 2011-10-26 ENCOUNTER — Other Ambulatory Visit (INDEPENDENT_AMBULATORY_CARE_PROVIDER_SITE_OTHER): Payer: Medicare Other | Admitting: *Deleted

## 2011-10-26 DIAGNOSIS — R079 Chest pain, unspecified: Secondary | ICD-10-CM

## 2011-10-26 DIAGNOSIS — I340 Nonrheumatic mitral (valve) insufficiency: Secondary | ICD-10-CM

## 2011-10-26 DIAGNOSIS — I059 Rheumatic mitral valve disease, unspecified: Secondary | ICD-10-CM

## 2011-10-28 ENCOUNTER — Encounter: Payer: Self-pay | Admitting: Cardiovascular Disease

## 2011-10-28 ENCOUNTER — Ambulatory Visit (INDEPENDENT_AMBULATORY_CARE_PROVIDER_SITE_OTHER): Payer: Medicare Other | Admitting: Cardiovascular Disease

## 2011-10-28 DIAGNOSIS — R0602 Shortness of breath: Secondary | ICD-10-CM

## 2011-10-28 DIAGNOSIS — I340 Nonrheumatic mitral (valve) insufficiency: Secondary | ICD-10-CM

## 2011-10-28 DIAGNOSIS — I059 Rheumatic mitral valve disease, unspecified: Secondary | ICD-10-CM

## 2011-10-28 DIAGNOSIS — R002 Palpitations: Secondary | ICD-10-CM

## 2011-10-28 DIAGNOSIS — J449 Chronic obstructive pulmonary disease, unspecified: Secondary | ICD-10-CM

## 2011-10-28 MED ORDER — FUROSEMIDE 40 MG PO TABS: 40.0000 mg | ORAL_TABLET | Freq: Every day | ORAL | Status: DC

## 2011-10-28 NOTE — Assessment & Plan Note (Signed)
He continues to smoke after he had stopped for a period of time. We have encouraged him to try hard for smoking cessation. His son smokes with him.

## 2011-10-28 NOTE — Assessment & Plan Note (Signed)
Symptoms are significantly improved on Lasix and beta blockers.

## 2011-10-28 NOTE — Progress Notes (Signed)
Patient ID: Michael Delacruz, male    DOB: 1941/11/07, 70 y.o.   MRN: [id number]  HPI Comments: Mr. Iiams is a 70 year old gentleman with a reported history of rheumatic fever as a child in his late teenage years, a murmur for several decades, long smoking history for 50 years with underlying COPD, with worsening shortness of breath this year, severe mitral valve regurgitation, pulmonary hypertension on echocardiogram who presents for Routine followup.  We have pushed aggressive diuretic regimen, beta blockers and he has been amazingly asymptomatic. He currently walks 6 miles per day and feels well with no complaints of shortness of breath or cough or lower extremity edema. He has started to smoke again.  Repeat echocardiogram shows mildly dilated left ventricle and left atrium, normal LV function estimated at 55-60%, moderate valve prolapse of the posterior leaflet with moderate to severe mitral valve regurgitation that is the centric and directed towards the septum, normal right ventricular systolic pressures  He had a cardiac catheterization  that showed severe mitral valve regurgitation, 30% mid and 30% proximal LAD disease, 50% mid circumflex disease ejection fraction 50%.  Old EKG shows normal sinus rhythm with rate 66 beats per minute with LVH   Outpatient Encounter Prescriptions as of 10/28/2011  Medication Sig Dispense Refill  . ALPRAZolam (XANAX) 0.25 MG tablet Take 0.25 mg by mouth 2 (two) times daily.       [name] aspirin 81 MG EC tablet Take 81 mg by mouth daily.        . clobetasol (TEMOVATE) 0.05 % cream as needed.      . enalapril (VASOTEC) 5 MG tablet Take 1 tablet (5 mg total) by mouth daily.  30 tablet  6  . furosemide (LASIX) 40 MG tablet Take 1 tablet (40 mg total) by mouth daily.  90 tablet  4  . metoprolol tartrate (LOPRESSOR) 12.5 mg TABS Take 12.5 mg by mouth 2 (two) times daily.       [name] omeprazole (PRILOSEC) 20 MG capsule Take 20 mg by mouth every other day.           Review of Systems  Constitutional: Negative.   HENT: Negative.   Eyes: Negative.   Respiratory: Negative.   Cardiovascular: Negative.   Gastrointestinal: Negative.   Musculoskeletal: Negative.   Skin: Negative.   Neurological: Negative.   Hematological: Negative.   Psychiatric/Behavioral: Negative.   All other systems reviewed and are negative.    BP 108/72  Pulse 62  Ht 6\' 1"  (1.854 m)  Wt 192 lb (87.091 kg)  BMI 25.33 kg/m2   Physical Exam  Nursing note and vitals reviewed. Constitutional: He is oriented to person, place, and time. He appears well-developed and well-nourished.  HENT:  Head: Normocephalic.  Nose: Nose normal.  Mouth/Throat: Oropharynx is clear and moist.  Eyes: Conjunctivae are normal. Pupils are equal, round, and reactive to light.  Neck: Normal range of motion. Neck supple. No JVD present.  Cardiovascular: Normal rate, regular rhythm, S1 normal, S2 normal and intact distal pulses.  Exam reveals no gallop and no friction rub.   Murmur heard.  Crescendo systolic murmur is present with a grade of 2/6  Pulmonary/Chest: Effort normal and breath sounds normal. No respiratory distress. He has no wheezes. He has no rales. He exhibits no tenderness.  Abdominal: Soft. Bowel sounds are normal. He exhibits no distension. There is no tenderness.  Musculoskeletal: Normal range of motion. He exhibits no edema and no tenderness.  Lymphadenopathy:    He has  no cervical adenopathy.  Neurological: He is alert and oriented to person, place, and time. Coordination normal.  Skin: Skin is warm and dry. No rash noted. No erythema.  Psychiatric: He has a normal mood and affect. His behavior is normal. Judgment and thought content normal.           Assessment and Plan

## 2011-10-28 NOTE — Assessment & Plan Note (Signed)
Heart palpitations, nonsustained VT have improved on beta blockers

## 2011-10-28 NOTE — Patient Instructions (Signed)
You are doing well. No medication changes were made.  Please call us if you have new issues that need to be addressed before your next appt.  The office will contact you for a follow up Appt. In 6 months We will need an echo in one year for mitral valve regurgitation

## 2011-10-28 NOTE — Assessment & Plan Note (Signed)
I suggested we continue aggressive medical management with repeat echocardiogram on an annual basis. We will be watching the left ventricular dilatation in size the left atrium, his ejection fraction and right ventricular systolic pressures.  We have mentioned to him that at any point, he could meet with cardiovascular surgery to discuss various treatment options for his mitral valve repair. He will call us in the beginning of the year to possibly set this up.

## 2011-11-22 ENCOUNTER — Other Ambulatory Visit: Payer: Self-pay | Admitting: Cardiovascular Disease

## 2012-01-22 ENCOUNTER — Other Ambulatory Visit: Payer: Self-pay | Admitting: Cardiovascular Disease

## 2012-04-26 ENCOUNTER — Ambulatory Visit: Payer: Medicare Other | Admitting: Cardiovascular Disease

## 2012-04-26 ENCOUNTER — Other Ambulatory Visit: Payer: Self-pay

## 2012-04-27 ENCOUNTER — Encounter: Payer: Self-pay | Admitting: Cardiovascular Disease

## 2012-04-27 ENCOUNTER — Ambulatory Visit (INDEPENDENT_AMBULATORY_CARE_PROVIDER_SITE_OTHER): Payer: Medicare Other | Admitting: Cardiovascular Disease

## 2012-04-27 VITALS — BP 116/73 | HR 61 | Ht 73.0 in | Wt 186.0 lb

## 2012-04-27 DIAGNOSIS — R079 Chest pain, unspecified: Secondary | ICD-10-CM

## 2012-04-27 DIAGNOSIS — F17200 Nicotine dependence, unspecified, uncomplicated: Secondary | ICD-10-CM

## 2012-04-27 DIAGNOSIS — I340 Nonrheumatic mitral (valve) insufficiency: Secondary | ICD-10-CM

## 2012-04-27 DIAGNOSIS — E785 Hyperlipidemia, unspecified: Secondary | ICD-10-CM

## 2012-04-27 DIAGNOSIS — R0602 Shortness of breath: Secondary | ICD-10-CM

## 2012-04-27 DIAGNOSIS — I2510 Atherosclerotic heart disease of native coronary artery without angina pectoris: Secondary | ICD-10-CM

## 2012-04-27 DIAGNOSIS — I059 Rheumatic mitral valve disease, unspecified: Secondary | ICD-10-CM

## 2012-04-27 MED ORDER — METOPROLOL TARTRATE 12.5 MG HALF TABLET: 12.5000 mg | ORAL_TABLET | Freq: Two times a day (BID) | ORAL | Status: DC

## 2012-04-27 MED ORDER — ENALAPRIL MALEATE 5 MG PO TABS: 5.0000 mg | ORAL_TABLET | Freq: Every day | ORAL | Status: DC

## 2012-04-27 MED ORDER — FUROSEMIDE 40 MG PO TABS: 40.0000 mg | ORAL_TABLET | Freq: Every day | ORAL | Status: DC

## 2012-04-27 NOTE — Assessment & Plan Note (Signed)
Appears relatively stable from symptomatic perspective. Repeat echocardiogram at the end of the year, prior to his next visit.

## 2012-04-27 NOTE — Patient Instructions (Signed)
You are doing well. No medication changes were made.  Please call us if you have new issues that need to be addressed before your next appt.  Your physician wants you to follow-up in: 6 months.  You will receive a reminder letter in the mail two months in advance. If you don't receive a letter, please call our office to schedule the follow-up appointment.   

## 2012-04-27 NOTE — Progress Notes (Signed)
Patient ID: Michael Delacruz, male    DOB: 1941/07/05, 71 y.o.   MRN: [id number]  HPI Comments: Mr. Baratta is a 71 year old gentleman with a reported history of rheumatic fever as a child in his late teenage years, a murmur for several decades, long smoking history for 50 years with underlying COPD, with worsening shortness of breath this year, severe mitral valve regurgitation, pulmonary hypertension on echocardiogram who presents for Routine followup.  He has continued on his diuretic regimen, beta blockers and he has been  relatively asymptomatic. He currently walks several miles per day and feels well with no complaints of shortness of breath or cough or lower extremity edema. He has started to smoke again and we continued to talk to him about this. He does have significant stress at home as both his son and daughter are living with him and his wife. They have one bathroom. He does report having mild shortness of breath in the morning which typically goes away as the day progresses. No difficulty lying flat, no orthopnea or PND.  Repeat echocardiogram 2012 shows mildly dilated left ventricle and left atrium, normal LV function estimated at 55-60%, moderate valve prolapse of the posterior leaflet with moderate to severe mitral valve regurgitation that is the centric and directed towards the septum, normal right ventricular systolic pressures  He had a cardiac catheterization  that showed severe mitral valve regurgitation, 30% mid and 30% proximal LAD disease, 50% mid circumflex disease ejection fraction 50%.  EKG shows normal sinus rhythm with rate 61 beats per minute with LVH and mild strain pattern   Outpatient Encounter Prescriptions as of 04/27/2012  Medication Sig Dispense Refill  . ALPRAZolam (XANAX) 0.25 MG tablet Take 0.25 mg by mouth 2 (two) times daily.       [name] aspirin 81 MG EC tablet Take 81 mg by mouth daily.        . clobetasol (TEMOVATE) 0.05 % cream as needed.      . enalapril  (VASOTEC) 5 MG tablet Take 1 tablet (5 mg total) by mouth daily.  90 tablet  3  . furosemide (LASIX) 40 MG tablet Take 1 tablet (40 mg total) by mouth daily.  90 tablet  3  . metoprolol tartrate (LOPRESSOR) 12.5 mg TABS Take 0.5 tablets (12.5 mg total) by mouth 2 (two) times daily.  180 tablet  3  . omeprazole (PRILOSEC) 20 MG capsule Take 20 mg by mouth every other day.        Review of Systems  Constitutional: Negative.   HENT: Negative.   Eyes: Negative.   Respiratory: Negative.   Cardiovascular: Negative.   Gastrointestinal: Negative.   Musculoskeletal: Negative.   Skin: Negative.   Neurological: Negative.   Hematological: Negative.   Psychiatric/Behavioral: The patient is nervous/anxious.   All other systems reviewed and are negative.    BP 116/73  Pulse 61  Ht 6\' 1"  (1.854 m)  Wt 186 lb (84.369 kg)  BMI 24.54 kg/m2  Physical Exam  Nursing note and vitals reviewed. Constitutional: He is oriented to person, place, and time. He appears well-developed and well-nourished.  HENT:  Head: Normocephalic.  Nose: Nose normal.  Mouth/Throat: Oropharynx is clear and moist.  Eyes: Conjunctivae are normal. Pupils are equal, round, and reactive to light.  Neck: Normal range of motion. Neck supple. No JVD present.  Cardiovascular: Normal rate, regular rhythm, S1 normal, S2 normal and intact distal pulses.  Exam reveals no gallop and no friction rub.   Murmur heard.  Crescendo systolic murmur is present with a grade of 2/6  Pulmonary/Chest: Effort normal and breath sounds normal. No respiratory distress. He has no wheezes. He has no rales. He exhibits no tenderness.  Abdominal: Soft. Bowel sounds are normal. He exhibits no distension. There is no tenderness.  Musculoskeletal: Normal range of motion. He exhibits no edema and no tenderness.  Lymphadenopathy:    He has no cervical adenopathy.  Neurological: He is alert and oriented to person, place, and time. Coordination normal.  Skin:  Skin is warm and dry. No rash noted. No erythema.  Psychiatric: He has a normal mood and affect. His behavior is normal. Judgment and thought content normal.           Assessment and Plan

## 2012-04-27 NOTE — Assessment & Plan Note (Signed)
We have encouraged him to continue to work on weaning his cigarettes and smoking cessation. He will continue to work on this and does not want any assistance with chantix.  

## 2012-04-27 NOTE — Assessment & Plan Note (Signed)
Mild coronary artery disease seen on previous cardiac catheterization. We will encourage smoking cessation and discuss cholesterol medication with him on his next visit.

## 2012-10-24 ENCOUNTER — Other Ambulatory Visit: Payer: Self-pay | Admitting: Cardiology

## 2012-10-24 DIAGNOSIS — I340 Nonrheumatic mitral (valve) insufficiency: Secondary | ICD-10-CM

## 2012-10-31 ENCOUNTER — Other Ambulatory Visit (INDEPENDENT_AMBULATORY_CARE_PROVIDER_SITE_OTHER): Payer: 59

## 2012-10-31 ENCOUNTER — Other Ambulatory Visit: Payer: Self-pay

## 2012-10-31 DIAGNOSIS — I340 Nonrheumatic mitral (valve) insufficiency: Secondary | ICD-10-CM

## 2012-10-31 DIAGNOSIS — I059 Rheumatic mitral valve disease, unspecified: Secondary | ICD-10-CM

## 2012-10-31 DIAGNOSIS — R0602 Shortness of breath: Secondary | ICD-10-CM

## 2012-11-02 ENCOUNTER — Ambulatory Visit (INDEPENDENT_AMBULATORY_CARE_PROVIDER_SITE_OTHER): Payer: Medicare Other | Admitting: Cardiovascular Disease

## 2012-11-02 ENCOUNTER — Encounter: Payer: Self-pay | Admitting: Cardiovascular Disease

## 2012-11-02 VITALS — BP 90/60 | HR 55 | Ht 73.0 in | Wt 186.5 lb

## 2012-11-02 DIAGNOSIS — R002 Palpitations: Secondary | ICD-10-CM

## 2012-11-02 DIAGNOSIS — Z639 Problem related to primary support group, unspecified: Secondary | ICD-10-CM

## 2012-11-02 DIAGNOSIS — I2510 Atherosclerotic heart disease of native coronary artery without angina pectoris: Secondary | ICD-10-CM

## 2012-11-02 DIAGNOSIS — M79609 Pain in unspecified limb: Secondary | ICD-10-CM

## 2012-11-02 DIAGNOSIS — I340 Nonrheumatic mitral (valve) insufficiency: Secondary | ICD-10-CM

## 2012-11-02 DIAGNOSIS — M79606 Pain in leg, unspecified: Secondary | ICD-10-CM | POA: Insufficient documentation

## 2012-11-02 DIAGNOSIS — I272 Pulmonary hypertension, unspecified: Secondary | ICD-10-CM

## 2012-11-02 DIAGNOSIS — R079 Chest pain, unspecified: Secondary | ICD-10-CM

## 2012-11-02 DIAGNOSIS — I739 Peripheral vascular disease, unspecified: Secondary | ICD-10-CM

## 2012-11-02 DIAGNOSIS — F439 Reaction to severe stress, unspecified: Secondary | ICD-10-CM

## 2012-11-02 DIAGNOSIS — I2789 Other specified pulmonary heart diseases: Secondary | ICD-10-CM

## 2012-11-02 DIAGNOSIS — I959 Hypotension, unspecified: Secondary | ICD-10-CM

## 2012-11-02 DIAGNOSIS — I059 Rheumatic mitral valve disease, unspecified: Secondary | ICD-10-CM

## 2012-11-02 NOTE — Assessment & Plan Note (Signed)
Right ventricular systolic pressures estimated at 50 mm mercury. We have suggested he decrease his fluid intake, take extra Lasix.

## 2012-11-02 NOTE — Progress Notes (Signed)
Patient ID: Michael Delacruz, male    DOB: [birth year], 71 y.o.   MRN: [id number]  HPI Comments: Mr. Boehle is a 71 year old gentleman with a reported history of rheumatic fever as a child in his late teenage years, a murmur for several decades, long smoking history for 50 years with underlying COPD, with worsening shortness of breath when he was first seen in clinic, severe mitral valve regurgitation on echocardiogram, moderate pulmonary hypertension on echocardiogram who presents for Routine followup.  He has continued on his diuretic regimen, beta blockers (in the Am secondary to bradycardia ) and he has been  relatively asymptomatic. He currently walks several miles per day and feels well with no complaints of shortness of breath or cough or lower extremity edema.He does have significant stress at home as both his son and daughter are living with him and his wife. They have one bathroom. He does report having mild shortness of breath in the morning which typically goes away as the day progresses. No difficulty lying flat, no orthopnea or PND.  Recent dramatic improvement in his weight and diet with drop of his cholesterol from 240 to 170  Repeat echocardiogram 2013 shows mildly dilated left ventricle at end systole, left ventricular size is less than 4 cm in diastole,   normal LV function estimated at >60%, moderate valve prolapse of the posterior leaflet with moderate to severe mitral valve regurgitation that is the centric and directed towards the septum, right ventricular systolic pressure estimated at 50 mm mercury  He had a cardiac catheterization  that showed severe mitral valve regurgitation, 30% mid and 30% proximal LAD disease, 50% mid circumflex disease ejection fraction 50%.  EKG shows normal sinus rhythm  with LVH and mild strain pattern, rate in the 50s   Outpatient Encounter Prescriptions as of 11/02/2012  Medication Sig Dispense Refill  . ALPRAZolam (XANAX) 0.25 MG tablet Take  0.25 mg by mouth 3 (three) times daily as needed.       [name] aspirin 81 MG EC tablet Take 81 mg by mouth daily.        . clobetasol (TEMOVATE) 0.05 % cream as needed.      . enalapril (VASOTEC) 5 MG tablet Take 1 tablet (5 mg total) by mouth daily.  90 tablet  3  . furosemide (LASIX) 40 MG tablet Take 1 tablet (40 mg total) by mouth daily.  90 tablet  3  . metoprolol tartrate (LOPRESSOR) 12.5 mg TABS Take 12.5 mg by mouth every morning.      [name] omeprazole (PRILOSEC) 20 MG capsule Take 20 mg by mouth every other day.         Review of Systems  Constitutional: Negative.   HENT: Negative.   Eyes: Negative.   Respiratory: Negative.   Cardiovascular: Negative.   Gastrointestinal: Negative.   Musculoskeletal: Negative.   Skin: Negative.   Neurological: Negative.   Hematological: Negative.   Psychiatric/Behavioral: The patient is nervous/anxious.   All other systems reviewed and are negative.    BP 90/60  Pulse 55  Ht 6\' 1"  (1.854 m)  Wt 186 lb 8 oz (84.596 kg)  BMI 24.61 kg/m2  Physical Exam  Nursing note and vitals reviewed. Constitutional: He is oriented to person, place, and time. He appears well-developed and well-nourished.  HENT:  Head: Normocephalic.  Nose: Nose normal.  Mouth/Throat: Oropharynx is clear and moist.  Eyes: Conjunctivae normal are normal. Pupils are equal, round, and reactive to light.  Neck: Normal range of motion.  Neck supple. No JVD present.  Cardiovascular: Normal rate, regular rhythm, S1 normal, S2 normal and intact distal pulses.  Exam reveals no gallop and no friction rub.   Murmur heard.  Crescendo systolic murmur is present with a grade of 2/6  Pulmonary/Chest: Effort normal and breath sounds normal. No respiratory distress. He has no wheezes. He has no rales. He exhibits no tenderness.  Abdominal: Soft. Bowel sounds are normal. He exhibits no distension. There is no tenderness.  Musculoskeletal: Normal range of motion. He exhibits no edema and no  tenderness.  Lymphadenopathy:    He has no cervical adenopathy.  Neurological: He is alert and oriented to person, place, and time. Coordination normal.  Skin: Skin is warm and dry. No rash noted. No erythema.  Psychiatric: He has a normal mood and affect. His behavior is normal. Judgment and thought content normal.           Assessment and Plan

## 2012-11-02 NOTE — Assessment & Plan Note (Signed)
Recent echocardiogram confirming moderate to severe/severe mitral valve regurgitation. No left ventricular end-diastolic dilation, ejection fraction greater than 60%, no symptoms of shortness of breath. He is walking up to 6 miles at a time. He does not want surgery at this time. We will repeat echocardiogram in one year.

## 2012-11-02 NOTE — Assessment & Plan Note (Signed)
He does have some atypical type leg pain. We will order ABIs. Unable to exclude claudication.

## 2012-11-02 NOTE — Assessment & Plan Note (Signed)
Blood pressure is running low today. He does report occasional dizzy episodes. We will hold his enalapril. If shortness of breath gets worse, we will restart enalapril

## 2012-11-02 NOTE — Assessment & Plan Note (Signed)
Currently with no symptoms of angina. No further workup at this time. Continue current medication regimen. 

## 2012-11-02 NOTE — Assessment & Plan Note (Signed)
We had a long discussion with him about his stress at home. Daughter and son are living with him causing significant stress.

## 2012-11-02 NOTE — Patient Instructions (Addendum)
You are doing well. No medication changes were made.  We will set you up for ABIs in [place] for your foot pain Repeat echo in one year, mitral valve regurgitation  Hold enalapril, blood pressure is low  Please call us if you have new issues that need to be addressed before your next appt.  Your physician wants you to follow-up in: 6 months.  You will receive a reminder letter in the mail two months in advance. If you don't receive a letter, please call our office to schedule the follow-up appointment.

## 2012-11-09 ENCOUNTER — Encounter (INDEPENDENT_AMBULATORY_CARE_PROVIDER_SITE_OTHER): Payer: 59

## 2012-11-09 DIAGNOSIS — I2510 Atherosclerotic heart disease of native coronary artery without angina pectoris: Secondary | ICD-10-CM

## 2012-11-09 DIAGNOSIS — I739 Peripheral vascular disease, unspecified: Secondary | ICD-10-CM

## 2013-02-13 ENCOUNTER — Telehealth: Payer: Self-pay

## 2013-02-13 NOTE — Telephone Encounter (Signed)
See below

## 2013-02-13 NOTE — Telephone Encounter (Signed)
Pt called and wanted Dr. Mariah Milling to call him, regarding a medication. He would not give any more information. I informed him Dr. Mariah Milling was not in the office, and he asked that Dr. Mariah Milling call him tomorrow before he starts seeing pts.

## 2013-02-16 NOTE — Telephone Encounter (Signed)
I tried to call that was unsuccessful and left phone messages Perhaps he can call and ask [place] his question about medications and I can answer it from out of town?

## 2013-02-19 NOTE — Telephone Encounter (Signed)
Pt says he was prescribed xanax 0.25 mg by PCP. Was recently told by PCP he needs to "cut back" on dose d/t it being a "controlled substance". Pt says he is out of med and is asking Dr. Mariah Milling for his help with this. I explained Dr. Mariah Milling does not usually prescribe these meds and may want to follow up with PC. Pt states, "Dr. Mariah Milling told me to call him if I needed anything". I told him I would pass this info along to Dr. Mariah Milling and will get back in touch with pt.

## 2013-02-27 NOTE — Telephone Encounter (Signed)
He is not on a very big dose, 0.25 As his primary care physician is making a recommendation, it is difficult for me to prescribe the medication and go above the care of his PMD.  Has he thought of changing primary care physicians? Several very good doctors around the area. I'm limited in my ability  to prescribe benzos.  We have done it in the past only as a bridge until the patient can get back to their primary care physician for a full prescription.

## 2013-02-28 NOTE — Telephone Encounter (Signed)
pts wife informed Understanding verb 

## 2013-04-23 ENCOUNTER — Encounter: Payer: Self-pay | Admitting: Cardiovascular Disease

## 2013-04-23 ENCOUNTER — Ambulatory Visit (INDEPENDENT_AMBULATORY_CARE_PROVIDER_SITE_OTHER): Payer: Medicare Other | Admitting: Cardiovascular Disease

## 2013-04-23 VITALS — BP 110/70 | HR 69 | Ht 66.0 in | Wt 181.0 lb

## 2013-04-23 DIAGNOSIS — R079 Chest pain, unspecified: Secondary | ICD-10-CM

## 2013-04-23 DIAGNOSIS — I059 Rheumatic mitral valve disease, unspecified: Secondary | ICD-10-CM

## 2013-04-23 DIAGNOSIS — I2789 Other specified pulmonary heart diseases: Secondary | ICD-10-CM

## 2013-04-23 DIAGNOSIS — R0602 Shortness of breath: Secondary | ICD-10-CM

## 2013-04-23 DIAGNOSIS — I272 Pulmonary hypertension, unspecified: Secondary | ICD-10-CM

## 2013-04-23 DIAGNOSIS — I340 Nonrheumatic mitral (valve) insufficiency: Secondary | ICD-10-CM

## 2013-04-23 DIAGNOSIS — I2510 Atherosclerotic heart disease of native coronary artery without angina pectoris: Secondary | ICD-10-CM

## 2013-04-23 DIAGNOSIS — E785 Hyperlipidemia, unspecified: Secondary | ICD-10-CM

## 2013-04-23 MED ORDER — DILTIAZEM HCL 30 MG PO TABS: 30.0000 mg | ORAL_TABLET | Freq: Four times a day (QID) | ORAL | Status: DC | PRN

## 2013-04-23 NOTE — Progress Notes (Signed)
Patient ID: Michael Delacruz, male    DOB: Aug 23, 1941, 72 y.o.   MRN: [id number]  HPI Comments: Michael Delacruz is a 72 year old gentleman with a reported history of rheumatic fever as a child in his late teenage years, a murmur for several decades, long smoking history for 50 years with underlying COPD, with worsening shortness of breath when he was first seen in clinic, severe mitral valve regurgitation on echocardiogram, moderate pulmonary hypertension on echocardiogram who presents for Routine followup.  He has continued on his diuretic regimen, beta blockers (in the Am secondary to bradycardia ) and he has been  relatively asymptomatic. He currently walks several miles per day and feels well with no complaints of shortness of breath or cough or lower extremity edema. Rare episodes of chest pain lasting several seconds. This has been going on for several years. He does report one episode of tachycardia several months ago lasting 1.5 hours. Resolved without intervention. Heart rate too fast to count. Also with episode of bronchitis, resolved with antibiotics. Pulse reports having several days of abdominal pain which also resolved.  He does report having mild shortness of breath in the morning which typically goes away as the day progresses. No difficulty lying flat, no orthopnea or PND.  Recent dramatic improvement in his weight and diet with drop of his cholesterol from 240 to 170. Cholesterol now back up to 260. He refused cholesterol pill  Repeat echocardiogram 2013 shows mildly dilated left ventricle at end systole, left ventricular size is less than 4 cm in diastole,   normal LV function estimated at >60%, moderate valve prolapse of the posterior leaflet with moderate to severe mitral valve regurgitation that is the centric and directed towards the septum, right ventricular systolic pressure estimated at 50 mm mercury  He had a cardiac catheterization  that showed severe mitral valve  regurgitation, 30% mid and 30% proximal LAD disease, 50% mid circumflex disease ejection fraction 50%.  EKG shows normal sinus rhythm  with rate 62 beats per minute with LVH and mild strain pattern   Outpatient Encounter Prescriptions as of 04/23/2013  Medication Sig Dispense Refill  . ALPRAZolam (XANAX) 0.25 MG tablet Take 0.25 mg by mouth 2 (two) times daily as needed.       . clobetasol (TEMOVATE) 0.05 % cream as needed.      . furosemide (LASIX) 40 MG tablet Take 1 tablet (40 mg total) by mouth daily.  90 tablet  3  . metoprolol tartrate (LOPRESSOR) 12.5 mg TABS Take 12.5 mg by mouth every morning.      . diltiazem (CARDIZEM) 30 MG tablet Take 1 tablet (30 mg total) by mouth 4 (four) times daily as needed.  90 tablet  3  . [DISCONTINUED] aspirin 81 MG EC tablet Take 81 mg by mouth daily.        . [DISCONTINUED] enalapril (VASOTEC) 5 MG tablet Take 1 tablet (5 mg total) by mouth daily.  90 tablet  3  . [DISCONTINUED] omeprazole (PRILOSEC) 20 MG capsule Take 20 mg by mouth every other day.        No facility-administered encounter medications on file as of 04/23/2013.    Review of Systems  Constitutional: Negative.   HENT: Negative.   Eyes: Negative.   Respiratory: Negative.   Cardiovascular: Negative.   Gastrointestinal: Negative.   Musculoskeletal: Negative.   Skin: Negative.   Neurological: Negative.   Psychiatric/Behavioral: The patient is nervous/anxious.   All other systems reviewed and are negative.  BP 110/70  Pulse 69  Ht 5\' 6"  (1.676 m)  Wt 181 lb (82.101 kg)  BMI 29.23 kg/m2  Physical Exam  Nursing note and vitals reviewed. Constitutional: He is oriented to person, place, and time. He appears well-developed and well-nourished.  HENT:  Head: Normocephalic.  Nose: Nose normal.  Mouth/Throat: Oropharynx is clear and moist.  Eyes: Conjunctivae are normal. Pupils are equal, round, and reactive to light.  Neck: Normal range of motion. Neck supple. No JVD present.   Cardiovascular: Normal rate, regular rhythm, S1 normal, S2 normal and intact distal pulses.  Exam reveals no gallop and no friction rub.   Murmur heard.  Crescendo systolic murmur is present with a grade of 2/6  Pulmonary/Chest: Effort normal and breath sounds normal. No respiratory distress. He has no wheezes. He has no rales. He exhibits no tenderness.  Abdominal: Soft. Bowel sounds are normal. He exhibits no distension. There is no tenderness.  Musculoskeletal: Normal range of motion. He exhibits no edema and no tenderness.  Lymphadenopathy:    He has no cervical adenopathy.  Neurological: He is alert and oriented to person, place, and time. Coordination normal.  Skin: Skin is warm and dry. No rash noted. No erythema.  Psychiatric: He has a normal mood and affect. His behavior is normal. Judgment and thought content normal.      Assessment and Plan

## 2013-04-23 NOTE — Assessment & Plan Note (Signed)
Last echocardiogram 6 months ago. Severe MR, dilated left atrium. Normal EF. Currently asymptomatic. He prefers to continue medical management

## 2013-04-23 NOTE — Assessment & Plan Note (Signed)
Currently with no symptoms of angina. No further workup at this time. Continue current medication regimen. 

## 2013-04-23 NOTE — Assessment & Plan Note (Signed)
Encouraged him to stay on his Lasix, take extra Lasix for any lower extremity edema or worsening shortness of breath.

## 2013-04-23 NOTE — Assessment & Plan Note (Signed)
Long smoking history and COPD causing mild symptoms

## 2013-04-23 NOTE — Patient Instructions (Addendum)
You are doing well. No medication changes were made.  Please take diltiazem with metoprolol as needed for tachycardia  Please call us if you have new issues that need to be addressed before your next appt.  Your physician wants you to follow-up in: 6 months.  You will receive a reminder letter in the mail two months in advance. If you don't receive a letter, please call our office to schedule the follow-up appointment.

## 2013-04-23 NOTE — Assessment & Plan Note (Signed)
Cholesterol seems to be higher with recent weight gain. He is reluctant to take cholesterol medication. We'll look for recheck in the next several months to determine if he needs cholesterol medication. My suspicion is that he will need a statin.

## 2013-05-25 ENCOUNTER — Other Ambulatory Visit: Payer: Self-pay | Admitting: Cardiovascular Disease

## 2013-05-25 NOTE — Telephone Encounter (Signed)
Refilled Metoprolol and Furosemide sent to [facility] pharmacy.

## 2014-06-27 ENCOUNTER — Other Ambulatory Visit: Payer: Self-pay | Admitting: Cardiovascular Disease

## 2014-12-04 ENCOUNTER — Emergency Department: Payer: Self-pay | Admitting: Emergency Medicine

## 2014-12-04 LAB — CBC
HCT: 44.7 % (ref 40.0–52.0)
HGB: 14.7 g/dL (ref 13.0–18.0)
MCH: 30.4 pg (ref 26.0–34.0)
MCHC: 32.9 g/dL (ref 32.0–36.0)
MCV: 92 fL (ref 80–100)
Platelet: 142 10*3/uL — ABNORMAL LOW (ref 150–440)
RBC: 4.83 10*6/uL (ref 4.40–5.90)
RDW: 13.5 % (ref 11.5–14.5)
WBC: 4.4 10*3/uL (ref 3.8–10.6)

## 2014-12-04 LAB — COMPREHENSIVE METABOLIC PANEL
Albumin: 3.3 g/dL — ABNORMAL LOW (ref 3.4–5.0)
Alkaline Phosphatase: 95 U/L
Anion Gap: 6 — ABNORMAL LOW (ref 7–16)
BUN: 6 mg/dL — ABNORMAL LOW (ref 7–18)
Bilirubin,Total: 0.5 mg/dL (ref 0.2–1.0)
Calcium, Total: 8.8 mg/dL (ref 8.5–10.1)
Chloride: 108 mmol/L — ABNORMAL HIGH (ref 98–107)
Co2: 27 mmol/L (ref 21–32)
Creatinine: 1.08 mg/dL (ref 0.60–1.30)
EGFR (African American): >60
EGFR (Non-African Amer.): >60
Glucose: 87 mg/dL (ref 65–99)
Osmolality: 278 (ref 275–301)
Potassium: 3.7 mmol/L (ref 3.5–5.1)
SGOT(AST): 19 U/L (ref 15–37)
SGPT (ALT): 15 U/L
Sodium: 141 mmol/L (ref 136–145)
Total Protein: 7.4 g/dL (ref 6.4–8.2)

## 2014-12-04 LAB — URINALYSIS, COMPLETE
Bilirubin,UR: NEGATIVE
Blood: NEGATIVE
Glucose,UR: NEGATIVE mg/dL (ref 0–75)
Nitrite: POSITIVE
Ph: 5 (ref 4.5–8.0)
Protein: 100
RBC,UR: 116 /HPF (ref 0–5)
Specific Gravity: 1.02 (ref 1.003–1.030)
Squamous Epithelial: NONE SEEN
Transitional Epi: 6
WBC UR: 5887 /HPF (ref 0–5)

## 2014-12-06 LAB — URINE CULTURE

## 2014-12-09 LAB — CULTURE, BLOOD (SINGLE)

## 2015-01-20 DIAGNOSIS — IMO0002 Reserved for concepts with insufficient information to code with codable children: Secondary | ICD-10-CM

## 2015-01-20 HISTORY — DX: Reserved for concepts with insufficient information to code with codable children: IMO0002

## 2015-01-24 ENCOUNTER — Emergency Department: Payer: Self-pay | Admitting: Emergency Medicine

## 2015-01-24 ENCOUNTER — Ambulatory Visit: Payer: Self-pay | Admitting: Registered Nurse

## 2015-01-24 LAB — COMPREHENSIVE METABOLIC PANEL
Albumin: 3.7 g/dL (ref 3.4–5.0)
Alkaline Phosphatase: 83 U/L (ref 46–116)
Anion Gap: 10 (ref 7–16)
BUN: 12 mg/dL (ref 7–18)
Bilirubin,Total: 0.4 mg/dL (ref 0.2–1.0)
Calcium, Total: 10.2 mg/dL — ABNORMAL HIGH (ref 8.5–10.1)
Chloride: 104 mmol/L (ref 98–107)
Co2: 28 mmol/L (ref 21–32)
Creatinine: 1.12 mg/dL (ref 0.60–1.30)
EGFR (African American): >60
EGFR (Non-African Amer.): >60
Glucose: 106 mg/dL — ABNORMAL HIGH (ref 65–99)
Osmolality: 283 (ref 275–301)
Potassium: 4.2 mmol/L (ref 3.5–5.1)
SGOT(AST): 13 U/L — ABNORMAL LOW (ref 15–37)
SGPT (ALT): 19 U/L (ref 14–63)
Sodium: 142 mmol/L (ref 136–145)
Total Protein: 7.6 g/dL (ref 6.4–8.2)

## 2015-01-24 LAB — URINALYSIS, COMPLETE
Bilirubin,UR: NEGATIVE
Glucose,UR: NEGATIVE
Ketone: NEGATIVE
Nitrite: NEGATIVE
Ph: 5.5 (ref 5.0–8.0)
Protein: NEGATIVE
Specific Gravity: 1.025 (ref 1.000–1.030)

## 2015-01-24 LAB — CBC WITH DIFFERENTIAL/PLATELET
Basophil #: 0 10*3/uL (ref 0.0–0.1)
Basophil %: 0.3 %
Eosinophil #: 0.2 10*3/uL (ref 0.0–0.7)
Eosinophil %: 2.5 %
HCT: 42.9 % (ref 40.0–52.0)
HGB: 14.6 g/dL (ref 13.0–18.0)
Lymphocyte #: 1.6 10*3/uL (ref 1.0–3.6)
Lymphocyte %: 23 %
MCH: 30.4 pg (ref 26.0–34.0)
MCHC: 34 g/dL (ref 32.0–36.0)
MCV: 89 fL (ref 80–100)
Monocyte #: 0.6 x10 3/mm (ref 0.2–1.0)
Monocyte %: 9 %
Neutrophil #: 4.5 10*3/uL (ref 1.4–6.5)
Neutrophil %: 65.2 %
Platelet: 150 10*3/uL (ref 150–440)
RBC: 4.81 10*6/uL (ref 4.40–5.90)
RDW: 14 % (ref 11.5–14.5)
WBC: 6.9 10*3/uL (ref 3.8–10.6)

## 2015-02-03 ENCOUNTER — Ambulatory Visit: Payer: Medicare Other | Admitting: Cardiovascular Disease

## 2015-02-05 ENCOUNTER — Encounter: Payer: Self-pay | Admitting: Cardiovascular Disease

## 2015-02-05 ENCOUNTER — Ambulatory Visit (INDEPENDENT_AMBULATORY_CARE_PROVIDER_SITE_OTHER): Payer: Medicare PPO | Admitting: Cardiovascular Disease

## 2015-02-05 VITALS — BP 120/72 | HR 71 | Ht 73.0 in | Wt 191.5 lb

## 2015-02-05 DIAGNOSIS — J449 Chronic obstructive pulmonary disease, unspecified: Secondary | ICD-10-CM

## 2015-02-05 DIAGNOSIS — I341 Nonrheumatic mitral (valve) prolapse: Secondary | ICD-10-CM

## 2015-02-05 DIAGNOSIS — I340 Nonrheumatic mitral (valve) insufficiency: Secondary | ICD-10-CM

## 2015-02-05 DIAGNOSIS — I2510 Atherosclerotic heart disease of native coronary artery without angina pectoris: Secondary | ICD-10-CM

## 2015-02-05 DIAGNOSIS — R0602 Shortness of breath: Secondary | ICD-10-CM

## 2015-02-05 MED ORDER — FUROSEMIDE 40 MG PO TABS: 40.0000 mg | ORAL_TABLET | Freq: Two times a day (BID) | ORAL | Status: DC | PRN

## 2015-02-05 NOTE — Progress Notes (Signed)
Patient ID: Michael Delacruz, male    DOB: 01/11/1941, 74 y.o.   MRN: [id number]  HPI Comments: Michael Delacruz is a 74 year old gentleman with a reported history of rheumatic fever as a child in his late teenage years, a murmur for several decades, long smoking history for 50 years with underlying COPD, with worsening shortness of breath when he was first seen in clinic, severe mitral valve regurgitation on echocardiogram, moderate pulmonary hypertension on echocardiogram who presents for Routine followup of his severe MR  In follow-up today, he reports that he has stopped all of his medications except for Lasix. He is been living with a elderly family member in [place], Currently reports having no significant symptoms of lower extremity edema, chest tightness. Does have occasional shortness of breath and cough. He has been skipping some of his Lasix doses. He is not tracking his weight. No recent echocardiogram. No significant tachycardia  Previously had bradycardia on metoprolol was taking 12.5 mg in the morning  EKG on today's visit shows normal sinus rhythm with rate 71 bpm, LVH with repolarization abnormality  Other past medical history previousdramatic improvement in his weight and diet with drop of his cholesterol from 240 to 170. Cholesterol now back up to 260. He refused cholesterol pill  Repeat echocardiogram 2013 shows mildly dilated left ventricle at end systole, left ventricular size is less than 4 cm in diastole,   normal LV function estimated at >60%, moderate valve prolapse of the posterior leaflet with moderate to severe mitral valve regurgitation that is the centric and directed towards the septum, right ventricular systolic pressure estimated at 50 mm mercury  He had a cardiac catheterization  that showed severe mitral valve regurgitation, 30% mid and 30% proximal LAD disease, 50% mid circumflex disease ejection fraction 50%.   Allergies  Allergen Reactions  .  Atropine   . Combigan [Brimonidine Tartrate-Timolol]     Outpatient Encounter Prescriptions as of 02/05/2015  Medication Sig  . furosemide (LASIX) 40 MG tablet Take 1 tablet (40 mg total) by mouth 2 (two) times daily as needed.  . [DISCONTINUED] furosemide (LASIX) 40 MG tablet TAKE 1 TABLET BY MOUTH EVERY DAY  . [DISCONTINUED] ALPRAZolam (XANAX) 0.25 MG tablet Take 0.25 mg by mouth 2 (two) times daily as needed.   . [DISCONTINUED] clobetasol (TEMOVATE) 0.05 % cream as needed.  . [DISCONTINUED] diltiazem (CARDIZEM) 30 MG tablet Take 1 tablet (30 mg total) by mouth 4 (four) times daily as needed. (Patient not taking: Reported on 02/05/2015)  . [DISCONTINUED] metoprolol tartrate (LOPRESSOR) 12.5 mg TABS Take 12.5 mg by mouth every morning.  . [DISCONTINUED] metoprolol tartrate (LOPRESSOR) 25 MG tablet TAKE 1/2 TABLET BY MOUTH TWICE A DAY (Patient not taking: Reported on 02/05/2015)    Past Medical History  Diagnosis Date  . COPD (chronic obstructive pulmonary disease)   . History of blood clots     eye   . Dilation of intestine 01/2015  . Asthma     Past Surgical History  Procedure Laterality Date  . Ankle surgery      Social History  reports that he has been smoking Cigarettes.  He has a 26 pack-year smoking history. He does not have any smokeless tobacco history on file. He reports that he does not drink alcohol or use illicit drugs.  Family History Family history is unknown by patient.  Review of Systems  Constitutional: Negative.   Respiratory: Positive for cough and shortness of breath.   Cardiovascular: Negative.  Gastrointestinal: Negative.   Musculoskeletal: Negative.   Neurological: Negative.   Psychiatric/Behavioral: Negative.   All other systems reviewed and are negative.   BP 120/72 mmHg  Pulse 71  Ht 6\' 1"  (1.854 m)  Wt 191 lb 8 oz (86.864 kg)  BMI 25.27 kg/m2  Physical Exam  Constitutional: He is oriented to person, place, and time. He appears  well-developed and well-nourished.  HENT:  Head: Normocephalic.  Nose: Nose normal.  Mouth/Throat: Oropharynx is clear and moist.  Eyes: Conjunctivae are normal. Pupils are equal, round, and reactive to light.  Neck: Normal range of motion. Neck supple. No JVD present.  Cardiovascular: Normal rate, regular rhythm, S1 normal, S2 normal and intact distal pulses.  Exam reveals no gallop and no friction rub.   Murmur heard.  Crescendo systolic murmur is present with a grade of 3/6  Pulmonary/Chest: Effort normal and breath sounds normal. No respiratory distress. He has no wheezes. He has no rales. He exhibits no tenderness.  Abdominal: Soft. Bowel sounds are normal. He exhibits no distension. There is no tenderness.  Musculoskeletal: Normal range of motion. He exhibits no edema or tenderness.  Lymphadenopathy:    He has no cervical adenopathy.  Neurological: He is alert and oriented to person, place, and time. Coordination normal.  Skin: Skin is warm and dry. No rash noted. No erythema.  Psychiatric: He has a normal mood and affect. His behavior is normal. Judgment and thought content normal.      Assessment and Plan   Nursing note and vitals reviewed.

## 2015-02-05 NOTE — Patient Instructions (Addendum)
You are doing well. No medication changes were made.  For shortness of breath or cough, take extra lasix after lunch  We will schedule an echocardiogram for mitral regurgitation  Please call us if you have new issues that need to be addressed before your next appt.  Your physician wants you to follow-up in: 6 months.  You will receive a reminder letter in the mail two months in advance. If you don't receive a letter, please call our office to schedule the follow-up appointment.

## 2015-02-05 NOTE — Assessment & Plan Note (Signed)
Severe MR in 2013. Recommended repeat echocardiogram. As he feels well, he states he is not particularly interested in surgical repair Recommended he be compliant with his Lasix, and take extra doses for cough and shortness of breath, PND

## 2015-02-05 NOTE — Assessment & Plan Note (Signed)
Recent shortness of breath symptoms likely from Lasix noncompliance. Recommended he take Lasix daily, extra doses after lunch until his cough and shortness of breath improves. Symptoms worse when supine

## 2015-02-05 NOTE — Assessment & Plan Note (Signed)
Long history of smoking. Mild chronic shortness of breath with exertion. Stable

## 2015-02-05 NOTE — Assessment & Plan Note (Signed)
Currently with no symptoms of angina. No further workup at this time. Continue current medication regimen. 

## 2015-02-18 ENCOUNTER — Other Ambulatory Visit (INDEPENDENT_AMBULATORY_CARE_PROVIDER_SITE_OTHER): Payer: Medicare PPO

## 2015-02-18 ENCOUNTER — Other Ambulatory Visit: Payer: Self-pay

## 2015-02-18 DIAGNOSIS — I341 Nonrheumatic mitral (valve) prolapse: Secondary | ICD-10-CM | POA: Diagnosis not present

## 2015-02-18 DIAGNOSIS — I340 Nonrheumatic mitral (valve) insufficiency: Secondary | ICD-10-CM | POA: Diagnosis not present

## 2015-02-18 DIAGNOSIS — I2510 Atherosclerotic heart disease of native coronary artery without angina pectoris: Secondary | ICD-10-CM | POA: Diagnosis not present

## 2015-02-18 DIAGNOSIS — R0602 Shortness of breath: Secondary | ICD-10-CM

## 2015-07-24 ENCOUNTER — Encounter: Payer: Self-pay | Admitting: Emergency Medicine

## 2015-07-24 ENCOUNTER — Emergency Department: Payer: Medicare HMO

## 2015-07-24 ENCOUNTER — Emergency Department
Admission: EM | Admit: 2015-07-24 | Discharge: 2015-07-24 | Disposition: A | Discharge status: Home / Self Care | Payer: Medicare HMO | Attending: Emergency Medicine | Admitting: Emergency Medicine

## 2015-07-24 DIAGNOSIS — J441 Chronic obstructive pulmonary disease with (acute) exacerbation: Secondary | ICD-10-CM | POA: Diagnosis not present

## 2015-07-24 DIAGNOSIS — Z720 Tobacco use: Secondary | ICD-10-CM | POA: Insufficient documentation

## 2015-07-24 DIAGNOSIS — R079 Chest pain, unspecified: Secondary | ICD-10-CM | POA: Insufficient documentation

## 2015-07-24 LAB — BASIC METABOLIC PANEL
Anion gap: 7 (ref 5–15)
BUN: 10 mg/dL (ref 6–20)
CO2: 25 mmol/L (ref 22–32)
Calcium: 10.2 mg/dL (ref 8.9–10.3)
Chloride: 108 mmol/L (ref 101–111)
Creatinine, Ser: 1.03 mg/dL (ref 0.61–1.24)
GFR calc Af Amer: >60 mL/min (ref >60)
GFR calc non Af Amer: >60 mL/min (ref >60)
Glucose, Bld: 129 mg/dL — ABNORMAL HIGH (ref 65–99)
Potassium: 3.5 mmol/L (ref 3.5–5.1)
Sodium: 140 mmol/L (ref 135–145)

## 2015-07-24 LAB — CBC
HCT: 41.4 % (ref 40.0–52.0)
Hemoglobin: 14.3 g/dL (ref 13.0–18.0)
MCH: 31.7 pg (ref 26.0–34.0)
MCHC: 34.6 g/dL (ref 32.0–36.0)
MCV: 91.5 fL (ref 80.0–100.0)
Platelets: 126 10*3/uL — ABNORMAL LOW (ref 150–440)
RBC: 4.52 MIL/uL (ref 4.40–5.90)
RDW: 13.2 % (ref 11.5–14.5)
WBC: 6.4 10*3/uL (ref 3.8–10.6)

## 2015-07-24 LAB — FIBRIN DERIVATIVES D-DIMER (ARMC ONLY): Fibrin derivatives D-dimer (ARMC): 309.94 (ref 0–499)

## 2015-07-24 LAB — TROPONIN I
Troponin I: <0.03 ng/mL (ref <0.031)
Troponin I: <0.03 ng/mL (ref <0.031)

## 2015-07-24 NOTE — ED Notes (Signed)
Second troponin sent to lab.

## 2015-07-24 NOTE — ED Provider Notes (Signed)
-----------------------------------------   10:19 AM on 07/24/2015 -----------------------------------------   Blood pressure 133/79, pulse 59, temperature 97.9 F (36.6 C), temperature source Oral, resp. rate 18, height 6\' 1"  (1.854 m), weight 196 lb (88.905 kg), SpO2 98 %.  Assuming care from Dr. ED physician.  In short, Fahd Galea is a 74 y.o. male with a chief complaint of Chest Pain .  Refer to the original H&P for additional details.  The current plan of care is to discharge patient home with follow-up to his cardiologist Dr. [name]. The patient's laboratory work was reviewed along with EKG and he remained pain-free during his stay here in emergency department. There was no abnormalities noted.   Daymon Larsen, MD 07/24/15 1021

## 2015-07-24 NOTE — ED Notes (Signed)
Patient present to ED via [facility] with c/o left chest pain radiating to neck and down left arm. Patient reports he was on the computer, leaned back on the chair to stretch and began to have chest pain. Reports felt nauseous for about 5-10 minutes, states hurts to take a deep breath in. EMS reports fire department bp 190/110, EMS gave 324 mg Aspirin. Patient reports is pain free at this time. Patient alert and oriented x 4, respirations even and unlabored.

## 2015-07-24 NOTE — Discharge Instructions (Signed)

## 2015-07-24 NOTE — ED Provider Notes (Signed)
[facility] Emergency Department Provider Note  Time seen: 6:33 AM  I have reviewed the triage vital signs and the nursing notes.   HISTORY  Chief Complaint Chest Pain    HPI Michael Delacruz is a 74 y.o. male with a past medical history of COPD, asthma, leaky mitral valve, presents the emergency department with sudden onset left-sided chest pain. According to the patient he was on his computer when he leaned back and stretched and took a deep breath in and experienced sudden sharp left-sided chest pain. He states the chest pain became a 10/10, worse with deep breath. Radiating somewhat to his left arm. He became nauseated. EMS arrived and brought the patient to the emergency department. EMS give the patient 324 mg of aspirin in route. Patient denies any chest pain upon arrival to the emergency department. Patient able to take deep breaths without any discomfort. Denies any recent leg pain or swelling. Denies cough/congestion, fever. Patient describes his chest pain at maximum 10/10, currently 0. It was worse with deep breathing.     Past Medical History  Diagnosis Date  . COPD (chronic obstructive pulmonary disease)   . History of blood clots     eye   . Dilation of intestine 01/2015  . Asthma   . Mitral valve disorder mitral valve leak    Patient Active Problem List   Diagnosis Date Noted  . Hypotension 11/02/2012  . Stress at home 11/02/2012  . Leg pain 11/02/2012  . Smoking 04/27/2012  . Hyperlipidemia 04/27/2012  . CAD (coronary artery disease) 04/27/2012  . Heart palpitations 09/03/2011  . Dizziness 05/19/2011  . SOB (shortness of breath) 05/05/2011  . MVP (mitral valve prolapse) 05/05/2011  . Mitral valve regurgitation 05/05/2011  . COPD (chronic obstructive pulmonary disease) 05/05/2011  . Pulmonary HTN 05/05/2011  . CHEST PAIN UNSPECIFIED 01/04/2011    Past Surgical History  Procedure Laterality Date  . Ankle surgery       Current Outpatient Rx  Name  Route  Sig  Dispense  Refill  . furosemide (LASIX) 40 MG tablet   Oral   Take 1 tablet (40 mg total) by mouth 2 (two) times daily as needed.   180 tablet   3     Allergies Atropine; Combigan; and Morphine and related  Family History  Problem Relation Age of Onset  . Family history unknown: Yes    Social History History  Substance Use Topics  . Smoking status: Current Every Day Smoker -- 0.50 packs/day for 52 years    Types: Cigarettes  . Smokeless tobacco: Not on file  . Alcohol Use: 0.5 oz/week    1 Standard drinks or equivalent per week    Review of Systems Constitutional: Negative for fever. Cardiovascular: Positive for chest pain. Respiratory: Positive for shortness of breath. Gastrointestinal: Negative for abdominal pain Neurological: Negative for headache 10-point ROS otherwise negative.  ____________________________________________   PHYSICAL EXAM:  VITAL SIGNS: ED Triage Vitals  Enc Vitals Group     BP 07/24/15 0614 145/80 mmHg     Pulse Rate 07/24/15 0614 60     Resp 07/24/15 0614 18     Temp 07/24/15 0614 97.9 F (36.6 C)     Temp Source 07/24/15 0614 Oral     SpO2 07/24/15 0614 97 %     Weight 07/24/15 0614 196 lb (88.905 kg)     Height 07/24/15 0614 6\' 1"  (1.854 m)     Head Cir --  Peak Flow --      Pain Score --      Pain Loc --      Pain Edu? --      Excl. in [place]? --     Constitutional: Alert and oriented. Well appearing and in no distress. Eyes: Normal exam ENT   Head: Normocephalic and atraumatic. Cardiovascular: Normal rate, regular rhythm. 2/6 systolic murmur Respiratory: Normal respiratory effort without tachypnea nor retractions. Breath sounds are clear and equal bilaterally. No wheezes/rales/rhonchi. No tenderness to chest wall palpation. Gastrointestinal: Soft and nontender. No distention.  There is no CVA tenderness. Musculoskeletal: Nontender with normal range of motion in all  extremities. No lower extremity tenderness or edema. Neurologic:  Normal speech and language. No gross focal neurologic deficits Skin:  Skin is warm, dry and intact.  Psychiatric: Mood and affect are normal. Speech and behavior are normal.   ____________________________________________    EKG  EKG reviewed and interpreted by myself shows sinus rhythm at 61 bpm, narrow QRS, normal axis, prolonged PR interval consistent with first-degree AV block, nonspecific ST changes are present. No ST elevations noted.  ____________________________________________    RADIOLOGY  Interstitial thickening, no acute disease.  ____________________________________________   INITIAL IMPRESSION / ASSESSMENT AND PLAN / ED COURSE  Pertinent labs & imaging results that were available during my care of the patient were reviewed by me and considered in my medical decision making (see chart for details).  Patient with acute onset of chest pain approximately one hour ago. 10/10 chest pain. It is now completely resolved. It was pleuritic initially, but again no pleuritic chest pain at this time. We will send labs including troponin, as well as a d-dimer. Obtain chest x-ray, and closely monitor in the emergency department. If the patient remains pain-free, I anticipate likely 2 sets of cardiac enzymes. The patient has cardiology follow-up with Dr.Golan.    Labs have resulted in largely normal range. Currently awaiting d-dimer result. Chest x-ray does not show any acute abnormality. We'll obtain a second cardiac enzyme at 9 AM, if normal the patient will follow-up with his cardiologist Dr. Candis Musa. The patient is agreeable to this plan.  ____________________________________________   FINAL CLINICAL IMPRESSION(S) / ED DIAGNOSES  Chest pain   Harvest Dark, MD 07/24/15 [phone]

## 2015-07-24 NOTE — ED Notes (Signed)
Patient transported to X-ray 

## 2015-07-24 NOTE — ED Notes (Addendum)
Dr. Paduchowski at bedside.  

## 2015-08-07 ENCOUNTER — Telehealth: Payer: Self-pay | Admitting: Cardiovascular Disease

## 2015-08-07 NOTE — Telephone Encounter (Signed)
Spoke with pt. He was in ED on July 24, 2015 with chest pain. Blood pressure reading below was at home prior to being transported to hospital. He has not checked since. Pt reports he has had a couple anxiety attacks since ED visit. Also a few brief "jolts in chest" which he states is normal for him. No pain like what took him to ED.  Pt aware of appt with Dr. [name] and plans on being at appt.  I told him to call if he has problems prior to this appt.

## 2015-08-07 NOTE — Telephone Encounter (Signed)
Patient in ed  Recently for chest pain .  Elevated bp 190/110.  Under stress and called ambulance due to chest pain L arm travelled to jaw shoulder and center of chest .  Moved appt up to Aug 29 at 8:40

## 2015-08-15 ENCOUNTER — Inpatient Hospital Stay
Admission: EM | Admit: 2015-08-15 | Discharge: 2015-08-18 | DRG: 308 | Disposition: A | Discharge status: Home / Self Care | Payer: Medicare HMO | Attending: Internal Medicine | Admitting: Internal Medicine

## 2015-08-15 ENCOUNTER — Encounter: Payer: Self-pay | Admitting: Emergency Medicine

## 2015-08-15 ENCOUNTER — Emergency Department: Payer: Medicare HMO

## 2015-08-15 DIAGNOSIS — I4891 Unspecified atrial fibrillation: Principal | ICD-10-CM | POA: Diagnosis present

## 2015-08-15 DIAGNOSIS — J432 Centrilobular emphysema: Secondary | ICD-10-CM | POA: Insufficient documentation

## 2015-08-15 DIAGNOSIS — I2510 Atherosclerotic heart disease of native coronary artery without angina pectoris: Secondary | ICD-10-CM | POA: Diagnosis present

## 2015-08-15 DIAGNOSIS — I272 Other secondary pulmonary hypertension: Secondary | ICD-10-CM | POA: Diagnosis present

## 2015-08-15 DIAGNOSIS — R739 Hyperglycemia, unspecified: Secondary | ICD-10-CM | POA: Diagnosis present

## 2015-08-15 DIAGNOSIS — J45909 Unspecified asthma, uncomplicated: Secondary | ICD-10-CM | POA: Diagnosis present

## 2015-08-15 DIAGNOSIS — R0602 Shortness of breath: Secondary | ICD-10-CM | POA: Diagnosis not present

## 2015-08-15 DIAGNOSIS — Z716 Tobacco abuse counseling: Secondary | ICD-10-CM | POA: Diagnosis present

## 2015-08-15 DIAGNOSIS — Z885 Allergy status to narcotic agent status: Secondary | ICD-10-CM | POA: Diagnosis not present

## 2015-08-15 DIAGNOSIS — Z8249 Family history of ischemic heart disease and other diseases of the circulatory system: Secondary | ICD-10-CM

## 2015-08-15 DIAGNOSIS — Z888 Allergy status to other drugs, medicaments and biological substances status: Secondary | ICD-10-CM

## 2015-08-15 DIAGNOSIS — J449 Chronic obstructive pulmonary disease, unspecified: Secondary | ICD-10-CM | POA: Diagnosis present

## 2015-08-15 DIAGNOSIS — E876 Hypokalemia: Secondary | ICD-10-CM | POA: Diagnosis present

## 2015-08-15 DIAGNOSIS — Z8673 Personal history of transient ischemic attack (TIA), and cerebral infarction without residual deficits: Secondary | ICD-10-CM

## 2015-08-15 DIAGNOSIS — F17210 Nicotine dependence, cigarettes, uncomplicated: Secondary | ICD-10-CM | POA: Diagnosis present

## 2015-08-15 DIAGNOSIS — R002 Palpitations: Secondary | ICD-10-CM | POA: Diagnosis present

## 2015-08-15 DIAGNOSIS — Z9889 Other specified postprocedural states: Secondary | ICD-10-CM | POA: Diagnosis not present

## 2015-08-15 DIAGNOSIS — R011 Cardiac murmur, unspecified: Secondary | ICD-10-CM | POA: Diagnosis present

## 2015-08-15 DIAGNOSIS — I5033 Acute on chronic diastolic (congestive) heart failure: Secondary | ICD-10-CM | POA: Diagnosis present

## 2015-08-15 DIAGNOSIS — I059 Rheumatic mitral valve disease, unspecified: Secondary | ICD-10-CM | POA: Diagnosis present

## 2015-08-15 DIAGNOSIS — I340 Nonrheumatic mitral (valve) insufficiency: Secondary | ICD-10-CM

## 2015-08-15 DIAGNOSIS — R55 Syncope and collapse: Secondary | ICD-10-CM

## 2015-08-15 HISTORY — DX: Personal history of other diseases of the circulatory system: Z86.79

## 2015-08-15 LAB — CBC WITH DIFFERENTIAL/PLATELET
Basophils Absolute: 0.1 10*3/uL (ref 0–0.1)
Basophils Relative: 1 %
Eosinophils Absolute: 0.1 10*3/uL (ref 0–0.7)
Eosinophils Relative: 1 %
HCT: 49 % (ref 40.0–52.0)
Hemoglobin: 16.8 g/dL (ref 13.0–18.0)
Lymphocytes Relative: 25 %
Lymphs Abs: 2.2 10*3/uL (ref 1.0–3.6)
MCH: 31.5 pg (ref 26.0–34.0)
MCHC: 34.2 g/dL (ref 32.0–36.0)
MCV: 92.1 fL (ref 80.0–100.0)
Monocytes Absolute: 0.7 10*3/uL (ref 0.2–1.0)
Monocytes Relative: 8 %
Neutro Abs: 5.9 10*3/uL (ref 1.4–6.5)
Neutrophils Relative %: 65 %
Platelets: 180 10*3/uL (ref 150–440)
RBC: 5.33 MIL/uL (ref 4.40–5.90)
RDW: 13.2 % (ref 11.5–14.5)
WBC: 9 10*3/uL (ref 3.8–10.6)

## 2015-08-15 LAB — BASIC METABOLIC PANEL
Anion gap: 11 (ref 5–15)
BUN: 19 mg/dL (ref 6–20)
CO2: 22 mmol/L (ref 22–32)
Calcium: 9.2 mg/dL (ref 8.9–10.3)
Chloride: 106 mmol/L (ref 101–111)
Creatinine, Ser: 1.43 mg/dL — ABNORMAL HIGH (ref 0.61–1.24)
GFR calc Af Amer: 55 mL/min — ABNORMAL LOW (ref >60)
GFR calc non Af Amer: 47 mL/min — ABNORMAL LOW (ref >60)
Glucose, Bld: 156 mg/dL — ABNORMAL HIGH (ref 65–99)
Potassium: 3.8 mmol/L (ref 3.5–5.1)
Sodium: 139 mmol/L (ref 135–145)

## 2015-08-15 LAB — MRSA PCR SCREENING: MRSA by PCR: NEGATIVE

## 2015-08-15 LAB — TSH
TSH: 4.63 u[IU]/mL — ABNORMAL HIGH (ref 0.350–4.500)
TSH: 3.078 u[IU]/mL (ref 0.350–4.500)

## 2015-08-15 LAB — APTT: aPTT: 34 seconds (ref 24–36)

## 2015-08-15 LAB — BRAIN NATRIURETIC PEPTIDE: B Natriuretic Peptide: 555 pg/mL — ABNORMAL HIGH (ref 0.0–100.0)

## 2015-08-15 LAB — TROPONIN I
Troponin I: <0.03 ng/mL (ref <0.031)
Troponin I: <0.03 ng/mL (ref <0.031)
Troponin I: <0.03 ng/mL (ref <0.031)

## 2015-08-15 LAB — PROTIME-INR
INR: 0.95
Prothrombin Time: 12.9 seconds (ref 11.4–15.0)

## 2015-08-15 MED ORDER — DILTIAZEM HCL 25 MG/5ML IV SOLN
20.0000 mg | Freq: Once | INTRAVENOUS | Status: AC
  Administered 2015-08-15: 20 mg via INTRAVENOUS

## 2015-08-15 MED ORDER — NICOTINE 21 MG/24HR TD PT24
21.0000 mg | MEDICATED_PATCH | Freq: Every day | TRANSDERMAL | Status: DC
  Filled 2015-08-15 (×4): qty 1

## 2015-08-15 MED ORDER — PNEUMOCOCCAL VAC POLYVALENT 25 MCG/0.5ML IJ INJ
0.5000 mL | INJECTION | INTRAMUSCULAR | Status: DC
  Filled 2015-08-15: qty 0.5

## 2015-08-15 MED ORDER — ONDANSETRON HCL 4 MG/2ML IJ SOLN: 4.0000 mg | Freq: Four times a day (QID) | INTRAMUSCULAR | Status: DC | PRN

## 2015-08-15 MED ORDER — ALUM & MAG HYDROXIDE-SIMETH 200-200-20 MG/5ML PO SUSP
30.0000 mL | Freq: Four times a day (QID) | ORAL | Status: DC | PRN
Start: 2015-08-15 — End: 2015-08-18
  Administered 2015-08-17 (×2): 30 mL via ORAL
  Filled 2015-08-15 (×2): qty 30

## 2015-08-15 MED ORDER — ASPIRIN 81 MG PO CHEW
324.0000 mg | CHEWABLE_TABLET | Freq: Once | ORAL | Status: AC
  Administered 2015-08-15: 324 mg via ORAL
  Filled 2015-08-15: qty 4

## 2015-08-15 MED ORDER — SENNOSIDES-DOCUSATE SODIUM 8.6-50 MG PO TABS: 1.0000 | ORAL_TABLET | Freq: Every evening | ORAL | Status: DC | PRN

## 2015-08-15 MED ORDER — METOPROLOL TARTRATE 25 MG PO TABS
12.5000 mg | ORAL_TABLET | Freq: Four times a day (QID) | ORAL | Status: DC
  Administered 2015-08-15 – 2015-08-16 (×6): 12.5 mg via ORAL
  Filled 2015-08-15 (×6): qty 1

## 2015-08-15 MED ORDER — HEPARIN (PORCINE) IN NACL 100-0.45 UNIT/ML-% IJ SOLN
1050.0000 [IU]/h | Freq: Once | INTRAMUSCULAR | Status: DC
  Filled 2015-08-15: qty 250

## 2015-08-15 MED ORDER — ACETAMINOPHEN 650 MG RE SUPP: 650.0000 mg | Freq: Four times a day (QID) | RECTAL | Status: DC | PRN

## 2015-08-15 MED ORDER — APIXABAN 5 MG PO TABS
5.0000 mg | ORAL_TABLET | Freq: Two times a day (BID) | ORAL | Status: DC
  Administered 2015-08-15 – 2015-08-18 (×6): 5 mg via ORAL
  Filled 2015-08-15 (×6): qty 1

## 2015-08-15 MED ORDER — DILTIAZEM HCL 100 MG IV SOLR
5.0000 mg/h | Freq: Once | INTRAVENOUS | Status: AC
  Administered 2015-08-15: 5 mg/h via INTRAVENOUS
  Filled 2015-08-15: qty 100

## 2015-08-15 MED ORDER — SODIUM CHLORIDE 0.9 % IJ SOLN
3.0000 mL | Freq: Two times a day (BID) | INTRAMUSCULAR | Status: DC
  Administered 2015-08-15 – 2015-08-17 (×4): 3 mL via INTRAVENOUS

## 2015-08-15 MED ORDER — DILTIAZEM HCL 100 MG IV SOLR
5.0000 mg/h | INTRAVENOUS | Status: DC
  Filled 2015-08-15: qty 100

## 2015-08-15 MED ORDER — DILTIAZEM HCL 25 MG/5ML IV SOLN
INTRAVENOUS | Status: AC
  Administered 2015-08-15: 20 mg via INTRAVENOUS
  Filled 2015-08-15: qty 5

## 2015-08-15 MED ORDER — FUROSEMIDE 10 MG/ML IJ SOLN
20.0000 mg | Freq: Every day | INTRAMUSCULAR | Status: DC
  Administered 2015-08-15 – 2015-08-18 (×2): 20 mg via INTRAVENOUS
  Filled 2015-08-15 (×4): qty 2

## 2015-08-15 MED ORDER — ONDANSETRON HCL 4 MG PO TABS: 4.0000 mg | ORAL_TABLET | Freq: Four times a day (QID) | ORAL | Status: DC | PRN

## 2015-08-15 MED ORDER — ACETAMINOPHEN 325 MG PO TABS: 650.0000 mg | ORAL_TABLET | Freq: Four times a day (QID) | ORAL | Status: DC | PRN

## 2015-08-15 MED ORDER — DILTIAZEM HCL 30 MG PO TABS
30.0000 mg | ORAL_TABLET | Freq: Once | ORAL | Status: AC
  Administered 2015-08-15: 30 mg via ORAL
  Filled 2015-08-15: qty 1

## 2015-08-15 NOTE — ED Notes (Signed)
Attempted to call report - ccu will call me back

## 2015-08-15 NOTE — Consult Note (Signed)
Cardiology Consultation Note  Patient ID: Michael Delacruz, MRN: [id number], DOB/AGE: [birth year] 74 y.o. Admit date: 08/15/2015   Date of Consult: 08/15/2015 Primary Physician: [name], MD Primary Cardiologist: Dr. [name], MD  Chief Complaint: Palpitations Reason for Consult: New onset Afib with RVR  HPI: 74 y.o. male with h/o rheumatic fever in his teenage years, severe mitral regurgitation, history of palpitations, NSVT and NSSVT, pulmonary HTN, COPD with long smoking history of 50 years, retinal arterial occlusion of the left eye, and asthma who presented to [facility] on 8/26 with at least 1 week of increased palpitations and was found to be in new onset Afib with RVR with heart rates into the 150s.  He was previously scheduled for for mitral valve repair back in 2012 at [facility]. Pre-op cardiac cath showed severe mitral regurgitation, 30% mid and 30% proximal LAD stenosis, 50% mid LCx stenosis, and EF 50%. He presented to [facility] for second option as he was extremely nervous about this procedure. It was suggested he have a TEE to further evaluate the pathology of the mitral valve at that time and his episodes of SOB were medically managed. Unfortunately, he never followed through with a TEE. He continued to feel better with medical therapy. Follow up TTE on 10/2012 showed EF 60-65%, ascending aorta was mildly dilated, severe mitral prolapse involving the posterior leaflet. Severe regurgitation directed anteriorly, LA was moderately to severely dilated, PASP was moderately increased at 50 mm Hg. He has been fairly acitve, able to walk several miles daily without issues. In 2014 he reported an episode of tachycardia that lasted 1.5 hours and resolved without intervention. Heart rate was too fast to count. He was having rare episodes of chest pains at that time as well that would last for a few seconds at a time, this had been going on for years. In follow up on 01/2015 he reported stopping all of his  medications except for his Lasix. No recent tachycardia or palpitations. Repeat echo in 02/2015 showed EF 60-65%, GR2DD, aortic root measured at 44 mm, severe mitral prolapse involving posterior leaflet, there was moderate to severe regurgitation directed eccentrically and anteriorly. Left atrium was severely dilated at 46 mm.  He was seen in the [facility] ED on 8/4 with complaints of 10/10 chest pain. EKG NSR, 1st degree AVB, nonspecific st/t changes. Troponin negative x 2, D-dimer negative, CXR non acute. He was discharged from the ED with planned outpatient follow up.   He returned to the [facility] ED on 8/26 with complaints of 1 week history of palpitations, worse on the evening of 8/25 that were described as "pounding." He was found to be in new onset Afib with RVR with heart rate in the 150s in the ED. He was also noted to be diaphoretic. No associated chest pain, nausea, vomiting, presyncope, or syncope. No recent illnesses, infections, or increased pain. When he would try and ambulate at home he would become quite SOB and note his HR would become quite tachycardic. He was started on Cardizem gtt at 5 mg/hr with improvement in HR to the low 100s to upper 90s. There is documented HR of 31 in Epic, though he was not on tele when this occurred, as I have reviewed tele to evaluate this and cannot find it. I suspect this is not his true ventricular rate when looking at telemetry heart rates and comparing them to documented pulse rates. When he is moved in the bed his HR will increase to the 120s. It  is Labs showed unremarkable CBC, SCr 1.43, K+ 3.8, BNP 555, troponin <0.03 x 1, TSH 4.630, CXR with COPD with mild cardiomegaly and mild pulmonary interstitial prominence which has improved from prior study.    Past Medical History  Diagnosis Date  . COPD (chronic obstructive pulmonary disease)   . History of blood clots     eye   . Dilation of intestine 01/2015  . Asthma   . Mitral regurgitation mitral valve leak     a. echo 02/2015: EF 60-65%, G2DD, dilated aortic root @ 44 mm, severe aortic prolapse involving posterior leaflet w/ mod to severe regurg directed eccentrically and anteriorly, LA dilated at 46 mm  . A-fib 07/2015    a. new onset during admission to [facility] 07/2015; b. on Eliquis  . History of rheumatic fever       Most Recent Cardiac Studies: Echo 02/2015  Study Conclusions  - Left ventricle: The cavity size was normal. Wall thickness was normal. Systolic function was normal. The estimated ejection fraction was in the range of 60% to 65%. Features are consistent with a pseudonormal left ventricular filling pattern, with concomitant abnormal relaxation and increased filling pressure (grade 2 diastolic dysfunction). - Aorta: Aortic root dimension: 44 mm (ED). - Aortic root: The aortic root was mildly dilated. - Mitral valve: Severe prolapse, involving the posterior leaflet. There was moderate to severe regurgitation directed eccentrically and anteriorly. Severe MR can not be excluded given the jet is very eccentric. - Left atrium: The atrium was severely dilated at 46 mm.  Echo 10/2012:  Study Conclusions  - Left ventricle: The cavity size was mildly dilated. Wall thickness was increased in a pattern of moderate LVH. Systolic function was normal. The estimated ejection fraction was in the range of 60% to 65%. Wall motion was normal; there were no regional wall motion abnormalities. - Aorta: Aortic root dimension: 32mm (ED). - Ascending aorta: The ascending aorta was mildly dilated. - Mitral valve: Severe prolapse, involving the posterior leaflet. Severe regurgitation directed anteriorly. - Left atrium: The atrium was moderately to severely dilated. - Pulmonary arteries: Systolic pressure was moderately increased, estimated to be 3mm Hg.  Cardiac cath 2012:    Right dominant system, ostial LM with minor irregs, prox LAD 30%, mid LAD 30%, mid LCx 50%,  mid RCA minor irregs, LVEF 50%, mitral valve with severe regurgitation.   Surgical History:  Past Surgical History  Procedure Laterality Date  . Ankle surgery       Home Meds: Prior to Admission medications   Medication Sig Start Date End Date Taking? Authorizing Provider  diphenhydrAMINE (BENADRYL) 25 MG tablet Take 50 mg by mouth every 6 (six) hours as needed for sleep.   Yes Historical Provider, MD  furosemide (LASIX) 40 MG tablet Take 1 tablet (40 mg total) by mouth 2 (two) times daily as needed. Patient taking differently: Take 40 mg by mouth 2 (two) times daily as needed.  02/05/15  Yes [name], MD    Inpatient Medications:  . apixaban  5 mg Oral BID  . nicotine  21 mg Transdermal Daily  . [START ON 08/16/2015] pneumococcal 23 valent vaccine  0.5 mL Intramuscular Tomorrow-1000  . sodium chloride  3 mL Intravenous Q12H      Allergies:  Allergies  Allergen Reactions  . Macrodantin [Nitrofurantoin Macrocrystal] Rash  . Morphine And Related Rash    Social History   Social History  . Marital Status: Married    Spouse Name: N/A  .  Number of Children: N/A  . Years of Education: N/A   Occupational History  . Not on file.   Social History Main Topics  . Smoking status: Current Every Day Smoker -- 0.50 packs/day for 52 years    Types: Cigarettes  . Smokeless tobacco: Not on file  . Alcohol Use: 0.5 oz/week    1 Standard drinks or equivalent per week  . Drug Use: No  . Sexual Activity: Not on file   Other Topics Concern  . Not on file   Social History Narrative     Family History  Problem Relation Age of Onset  . Family history unknown: Yes     Review of Systems: Review of Systems  Constitutional: Positive for malaise/fatigue. Negative for fever, chills, weight loss and diaphoresis.  HENT: Negative for congestion.   Eyes: Negative for discharge and redness.  Respiratory: Negative for cough, hemoptysis, sputum production, shortness of breath and  wheezing.   Cardiovascular: Positive for palpitations. Negative for chest pain, orthopnea, claudication, leg swelling and PND.  Gastrointestinal: Negative for heartburn, nausea and vomiting.  Musculoskeletal: Negative for myalgias and falls.  Skin: Negative for rash.  Neurological: Positive for weakness. Negative for sensory change, speech change and focal weakness.  Endo/Heme/Allergies: Does not bruise/bleed easily.  Psychiatric/Behavioral: The patient is not nervous/anxious.      Labs:  Recent Labs  08/15/15 1311  TROPONINI <0.03   Lab Results  Component Value Date   WBC 9.0 08/15/2015   HGB 16.8 08/15/2015   HCT 49.0 08/15/2015   MCV 92.1 08/15/2015   PLT 180 08/15/2015     Recent Labs Lab 08/15/15 1311  NA 139  K 3.8  CL 106  CO2 22  BUN 19  CREATININE 1.43*  CALCIUM 9.2  GLUCOSE 156*   No results found for: CHOL, HDL, LDLCALC, TRIG No results found for: DDIMER  Radiology/Studies:  Dg Chest 2 View  07/24/2015   CLINICAL DATA:  Left chest pain radiating to the neck and left arm.  EXAM: CHEST  2 VIEW  COMPARISON:  02/07/2011  FINDINGS: There is mild interstitial coarsening which could represent fibrosis or interstitial fluid. This is worsened from 02/07/2011. There is no alveolar opacity. There is no effusion. Heart size is upper normal an unchanged. Hilar and mediastinal contours are unremarkable.  IMPRESSION: Interstitial thickening due to fluid or fibrosis. No confluent airspace consolidation. No effusion.   Electronically Signed   By: Andreas Newport M.D.   On: 07/24/2015 06:49   Dg Chest Port 1 View  08/15/2015   CLINICAL DATA:  Tachycardia and shortness of breath onset 2 days ago, history COPD, coronary artery disease, and mitral regurgitation.  EXAM: PORTABLE CHEST - 1 VIEW  COMPARISON:  PA and lateral chest x-ray of July 24, 2015  FINDINGS: The lungs are well-expanded. There is no focal infiltrate. There is no pleural effusion. The interstitial markings are  coarse but stable. The cardiac silhouette is enlarged. The pulmonary vascularity is not engorged. There is no pleural effusion. External pacemaker defibrillator pads are present. A cardiac rhythm recording device is present to the right of the mid thoracic spine.  IMPRESSION: COPD with mild cardiomegaly and mild pulmonary interstitial prominence. The interstitial prominence has improved since the previous study. There is no alveolar pneumonia.   Electronically Signed   By: David  Martinique M.D.   On: 08/15/2015 14:03    EKG: Afib with RVR, 149 bpm, LVH, rare PVC, lateral st/t changes   Weights: [employer]  08/15/15 1307  Weight: 195 lb (88.451 kg)     Physical Exam: Blood pressure 123/108, pulse 71, temperature 98 F (36.7 C), resp. rate 20, height 6\' 1"  (1.854 m), weight 195 lb (88.451 kg), SpO2 93 %. Body mass index is 25.73 kg/(m^2). General: Well developed, well nourished, in no acute distress. Head: Normocephalic, atraumatic, sclera non-icteric, no xanthomas, nares are without discharge.  Neck: Negative for carotid bruits. JVD not elevated. Lungs: Clear bilaterally to auscultation without wheezes, rales, or rhonchi. Breathing is unlabored. Heart: Irregularly-irregular, tachycardic, with S1 S2. III/VI systolic murmurs at the apex. No rubs or gallops appreciated. Abdomen: Soft, non-tender, non-distended with normoactive bowel sounds. No hepatomegaly. No rebound/guarding. No obvious abdominal masses. Msk:  Strength and tone appear normal for age. Extremities: No clubbing or cyanosis. No edema.  Distal pedal pulses are 2+ and equal bilaterally. Neuro: Alert and oriented X 3. No facial asymmetry. No focal deficit. Moves all extremities spontaneously. Psych:  Responds to questions appropriately with a normal affect.    Assessment and Plan:  74 y.o. male with h/o rheumatic fever in his teenage years, severe mitral regurgitation, history of palpitations, NSVT and NSSVT, diastolic  CHF/pulmonary HTN, COPD with long smoking history of 50 years, retinal arterial occlusion of the left eye, and asthma who presented to [facility] on 8/26 with at least 1 week of increased palpitations and was found to be in new onset Afib with RVR with heart rates into the 150s.  1. New onset Afib with RVR: -Presented with heart rates in the 150s, improved with IV Cardizem gtt at 5 mg/hr -Suspect the bradycardic rates documented in vitals are not true ventricular rates, as upon reviewing telemetry he has not bradycardic episodes -Could likely discontinue pads -Add PO metoprolol 12.5 mg q 6 hours, with plan to possibly consolidate on 8/27 -Continue Cardizem gtt at this time with plans to transition to PO Cardizem on 8/27 -Agree with Eliquis 5 mg bid, risks of anticoagulation discussed with patient in detail  -CHADSVASc at least 4 (CHF, age x 1, stroke) giving him an estimated annual stroke risk of 4.0% -Recent echo 02/2015 as above, no need to repeat -Has a long history of tachy-palpitations -Last ischemic evaluation was in 2012 as part of his pre-op evaluation for possible mitral valve repair/replacement  -Could plan for outpatient nuclear stress testing  2. Severe mitral regurgitation: -Asymptomatic  -Would need TEE to evaluate pathology at later date to assess for possible surgery, along with updating right and left cardiac cath -Continue current medications  3. Diastolic CHF/pulmonary HTN: -Euvolemic on exam today -Add Lopressor as above -Continue home medications  4. COPD: -Per IM   Signed, Christell Faith, PA-C Pager: [phone] 08/15/2015, 4:57 PM

## 2015-08-15 NOTE — H&P (Addendum)
[facility] NAME: Michael Delacruz    MR#:  [id number]  DATE OF BIRTH:  1941/11/10  DATE OF ADMISSION:  08/15/2015  PRIMARY CARE PHYSICIAN: [name], MD   REQUESTING/REFERRING PHYSICIAN: Dr Jimmye Norman  CHIEF COMPLAINT:  Palpitations HISTORY OF PRESENT ILLNESS:  Michael Delacruz  is a 74 y.o. male with a known history of valvular disorder who presents with above complaint. Patient reports since Wednesday he's had palpitations. He presented to emergency room in A. fib with RVR. He received diltiazem and will be started on IV diltiazem drip. He reports that he has had a history of atrial fibrillation in the past. He is currently denying chest pain or shortness of breath.  PAST MEDICAL HISTORY:   Past Medical History  Diagnosis Date  . COPD (chronic obstructive pulmonary disease)   . History of blood clots     eye   . Dilation of intestine 01/2015  . Asthma   . Mitral valve disorder mitral valve leak    PAST SURGICAL HISTORY:   Past Surgical History  Procedure Laterality Date  . Ankle surgery      SOCIAL HISTORY:   Social History  Substance Use Topics  . Smoking status: Current Every Day Smoker -- 0.50 packs/day for 52 years    Types: Cigarettes  . Smokeless tobacco: Not on file  . Alcohol Use: 0.5 oz/week    1 Standard drinks or equivalent per week    FAMILY HISTORY:  CAD  DRUG ALLERGIES:   Allergies  Allergen Reactions  . Macrodantin [Nitrofurantoin Macrocrystal] Other (See Comments)    Unknown  . Morphine And Related Rash     REVIEW OF SYSTEMS:  CONSTITUTIONAL: No fever, fatigue or weakness.  EYES: No blurred or double vision.  EARS, NOSE, AND THROAT: No tinnitus or ear pain.  RESPIRATORY: No cough, shortness of breath, wheezing or hemoptysis.  CARDIOVASCULAR: No chest pain, orthopnea, edema.  positive palpitations  GASTROINTESTINAL: No nausea, vomiting, diarrhea or abdominal pain.   GENITOURINARY: No dysuria, hematuria.  ENDOCRINE: No polyuria, nocturia,  HEMATOLOGY: No anemia, easy bruising or bleeding SKIN: No rash or lesion. MUSCULOSKELETAL: No joint pain or arthritis.   NEUROLOGIC: No tingling, numbness, weakness.  PSYCHIATRY: No anxiety or depression.   MEDICATIONS AT HOME:   Prior to Admission medications   Medication Sig Start Date End Date Taking? Authorizing Provider  furosemide (LASIX) 40 MG tablet Take 1 tablet (40 mg total) by mouth 2 (two) times daily as needed. 02/05/15   [name], MD      VITAL SIGNS:  Blood pressure 121/88, pulse 49, temperature 98 F (36.7 C), resp. rate 21, height 6\' 1"  (1.854 m), weight 88.451 kg (195 lb), SpO2 95 %.  PHYSICAL EXAMINATION:  GENERAL:  74 y.o.-year-old patient lying in the bed with no acute distress.  EYES: Pupils equal, round, reactive to light and accommodation. No scleral icterus. Extraocular muscles intact.  HEENT: Head atraumatic, normocephalic. Oropharynx and nasopharynx clear.  NECK:  Supple, no jugular venous distention. No thyroid enlargement, no tenderness.  LUNGS: Normal breath sounds bilaterally, no wheezing, rales,rhonchi or crepitation. No use of accessory muscles of respiration.  CARDIOVASCULAR: irregular, irregular tachycardia with 3/6 systolic ejection murmur no rubs  or gallops.  ABDOMEN: Soft, nontender, nondistended. Bowel sounds present. No organomegaly or mass.  EXTREMITIES: No pedal edema, cyanosis, or clubbing.  NEUROLOGIC: Cranial nerves II through XII are grossly intact. No focal deficits. PSYCHIATRIC: The patient is alert  and oriented x 3.  SKIN: No obvious rash, lesion, or ulcer.   LABORATORY PANEL:   CBC  Recent Labs Lab 08/15/15 1311  WBC 9.0  HGB 16.8  HCT 49.0  PLT 180   ------------------------------------------------------------------------------------------------------------------  Chemistries   Recent Labs Lab 08/15/15 1311  NA 139  K 3.8  CL 106   CO2 22  GLUCOSE 156*  BUN 19  CREATININE 1.43*  CALCIUM 9.2   ------------------------------------------------------------------------------------------------------------------  Cardiac Enzymes  Recent Labs Lab 08/15/15 1311  TROPONINI <0.03   ------------------------------------------------------------------------------------------------------------------  RADIOLOGY:  Dg Chest Port 1 View  08/15/2015   CLINICAL DATA:  Tachycardia and shortness of breath onset 2 days ago, history COPD, coronary artery disease, and mitral regurgitation.  EXAM: PORTABLE CHEST - 1 VIEW  COMPARISON:  PA and lateral chest x-ray of July 24, 2015  FINDINGS: The lungs are well-expanded. There is no focal infiltrate. There is no pleural effusion. The interstitial markings are coarse but stable. The cardiac silhouette is enlarged. The pulmonary vascularity is not engorged. There is no pleural effusion. External pacemaker defibrillator pads are present. A cardiac rhythm recording device is present to the right of the mid thoracic spine.  IMPRESSION: COPD with mild cardiomegaly and mild pulmonary interstitial prominence. The interstitial prominence has improved since the previous study. There is no alveolar pneumonia.   Electronically Signed   By: David  Martinique M.D.   On: 08/15/2015 14:03    EKG:   atrial fibrillation heart rate 149  IMPRESSION AND PLAN:   THIS IS A 47-YEAR-OLD MALE WITH MITRAL VALVE DISORDER WHO PRESENTS WITH ATRIAL FIBRILLATION RVR.   1. Atrial fibrillation and RVR: Patient will be placed in Ceftin unit on diltiazem drip. Cardiology will be consulted. I will not order an echocardiogram until further evaluation by cardiology. I will order TSH. Chads score is 2. He would benefit from anticoagulation. However due to mitral valve disease I am reluctant to start anticoagulation until cartilages his patient consultation.   2. Mitral valve heart murmur: As per cardiology.  3. Elevated blood sugar:  Repeat BMP in a.m. if still elevated then check hemoglobin A1c to evaluate for diabetes.  4. Tobacco dependence: Patient was counseled for 3 minutes regarding stopping smoking. He does want a nicotine patch. Order has been placed   All the records are reviewed and case discussed with ED provider. Management plans discussed with the patient and he is in agreement.  CODE STATUS: FULL  CRITICAL CARE TOTAL TIME TAKING CARE OF THIS PATIENT: 55 minutes.    Uziel Covault M.D on 08/15/2015 at 2:37 PM  Between 7am to 6pm - Pager - [phone] After 6pm go to [email or web address] - password [email or web address] [facility]  Office  [phone]  CC: Primary care physician; [name], MD

## 2015-08-15 NOTE — ED Provider Notes (Signed)
[facility] Emergency Department Provider Note     Time seen: ----------------------------------------- 1:17 PM on 08/15/2015 -----------------------------------------    I have reviewed the triage vital signs and the nursing notes.   HISTORY  Chief Complaint Palpitations    HPI Michael Delacruz is a 74 y.o. male who presents ER for palpitations on and off for 2 days. Patient brought in diaphoretic noted to be mildly short of breath. She states he was seen here recently for chest pain, has not had a history of arrhythmia that he is aware of. He is brought in his heart rate very fast in the 150s, also complains of chest pain is like a shock that is intermittent.   Past Medical History  Diagnosis Date  . COPD (chronic obstructive pulmonary disease)   . History of blood clots     eye   . Dilation of intestine 01/2015  . Asthma   . Mitral valve disorder mitral valve leak    Patient Active Problem List   Diagnosis Date Noted  . Hypotension 11/02/2012  . Stress at home 11/02/2012  . Leg pain 11/02/2012  . Smoking 04/27/2012  . Hyperlipidemia 04/27/2012  . CAD (coronary artery disease) 04/27/2012  . Heart palpitations 09/03/2011  . Dizziness 05/19/2011  . SOB (shortness of breath) 05/05/2011  . MVP (mitral valve prolapse) 05/05/2011  . Mitral valve regurgitation 05/05/2011  . COPD (chronic obstructive pulmonary disease) 05/05/2011  . Pulmonary HTN 05/05/2011  . CHEST PAIN UNSPECIFIED 01/04/2011    Past Surgical History  Procedure Laterality Date  . Ankle surgery      Allergies Macrodantin and Morphine and related  Social History Social History  Substance Use Topics  . Smoking status: Current Every Day Smoker -- 0.50 packs/day for 52 years    Types: Cigarettes  . Smokeless tobacco: None  . Alcohol Use: 0.5 oz/week    1 Standard drinks or equivalent per week    Review of Systems Constitutional: Negative for fever. Eyes:  Negative for visual changes. ENT: Negative for sore throat. Cardiovascular: Positive for chest pain, palpitations Respiratory: Negative for shortness of breath. Gastrointestinal: Negative for abdominal pain, vomiting and diarrhea. Genitourinary: Negative for dysuria. Musculoskeletal: Negative for back pain. Skin: Positive for diaphoresis Neurological: Negative for headaches, focal weakness or numbness.  10-point ROS otherwise negative.  ____________________________________________   PHYSICAL EXAM:  VITAL SIGNS: ED Triage Vitals  Enc Vitals Group     BP 08/15/15 1307 115/72 mmHg     Pulse Rate 08/15/15 1307 150     Resp 08/15/15 1307 20     Temp 08/15/15 1307 98 F (36.7 C)     Temp src --      SpO2 08/15/15 1307 98 %     Weight 08/15/15 1307 195 lb (88.451 kg)     Height 08/15/15 1307 6\' 1"  (1.854 m)     Head Cir --      Peak Flow --      Pain Score 08/15/15 1309 3     Pain Loc --      Pain Edu? --      Excl. in [place]? --     Constitutional: Alert and oriented. Well appearing, mild distress Eyes: Conjunctivae are normal. PERRL. Normal extraocular movements. ENT   Head: Normocephalic and atraumatic.   Nose: No congestion/rhinnorhea.   Mouth/Throat: Mucous membranes are moist.   Neck: No stridor. Cardiovascular: Rapid rate, irregular rhythm.. Normal and symmetric distal pulses are present in all extremities. No murmurs,  rubs, or gallops. Respiratory: Normal respiratory effort without tachypnea nor retractions. Breath sounds are clear and equal bilaterally. No wheezes/rales/rhonchi. Gastrointestinal: Soft and nontender. No distention. No abdominal bruits.  Musculoskeletal: Nontender with normal range of motion in all extremities. No joint effusions.  No lower extremity tenderness nor edema. Neurologic:  Normal speech and language. No gross focal neurologic deficits are appreciated. Speech is normal. No gait instability. Skin:  Mild diaphoresis Psychiatric: Mood  and affect are normal. Speech and behavior are normal. Patient exhibits appropriate insight and judgment. ____________________________________________  EKG: Interpreted by me. Atrial fibrillation with a rapid ventricular response or rate of 149 bpm, and normal QRS with, normal QT interval. Nonspecific ST and T-wave changes.  ____________________________________________  ED COURSE:  Pertinent labs & imaging results that were available during my care of the patient were reviewed by me and considered in my medical decision making (see chart for details). Patient will need IV Cardizem for rate control., We'll check cardiac labs and reevaluate. ____________________________________________    LABS (pertinent positives/negatives)  Labs Reviewed  BASIC METABOLIC PANEL - Abnormal; Notable for the following:    Glucose, Bld 156 (*)    Creatinine, Ser 1.43 (*)    GFR calc non Af Amer 47 (*)    GFR calc Af Amer 55 (*)    All other components within normal limits  CBC WITH DIFFERENTIAL/PLATELET  TROPONIN I  BRAIN NATRIURETIC PEPTIDE    RADIOLOGY  Chest x-ray  IMPRESSION: COPD with mild cardiomegaly and mild pulmonary interstitial prominence. The interstitial prominence has improved since the previous study. There is no alveolar pneumonia. ____________________________________________  FINAL ASSESSMENT AND PLAN  Atrial fibrillation with a rapid ventricular response  Plan: Patient with labs and imaging as dictated above. Patient with new onset atrial fibrillation, currently mostly asymptomatic. Still in atrial fibrillation after IV and oral Cardizem. Will need admission and cardiology evaluation. He is currently a patient Dr. Minerva Areola, Algis Liming, MD   Earleen Newport, MD 08/15/15 [phone]

## 2015-08-15 NOTE — ED Notes (Signed)
Pt states palpations since Wednesday, states he called his cardioligist and was encouraged to come in but never did, arrives today feeling like his heart is racing, pt hr 140s-180s upon arrival, pt awake and alert, denies pain, Dr. Jimmye Norman at bedside, pt on code cart pads, pt states hx of palpatations

## 2015-08-15 NOTE — ED Notes (Signed)
Reports palpitations off and on for 2 days.  Clammy on arrival.  A&O

## 2015-08-15 NOTE — Progress Notes (Signed)
ANTICOAGULATION CONSULT NOTE - Initial Consult  Pharmacy Consult for Apixaban  Indication: atrial fibrillation   Allergies  Allergen Reactions  . Macrodantin [Nitrofurantoin Macrocrystal] Rash  . Morphine And Related Rash    Patient Measurements: Height: 6\' 1"  (185.4 cm) Weight: 195 lb (88.451 kg) IBW/kg (Calculated) : 79.9  Vital Signs: Temp: 98 F (36.7 C) (08/26 1307) BP: 123/108 mmHg (08/26 1530) Pulse Rate: 71 (08/26 1530)  Labs:  Recent Labs  08/15/15 1311  HGB 16.8  HCT 49.0  PLT 180  APTT 34  LABPROT 12.9  INR 0.95  CREATININE 1.43*  TROPONINI <0.03    Estimated Creatinine Clearance: 52.8 mL/min (by C-G formula based on Cr of 1.43).   Medical History: Past Medical History  Diagnosis Date  . COPD (chronic obstructive pulmonary disease)   . History of blood clots     eye   . Dilation of intestine 01/2015  . Asthma   . Mitral valve disorder mitral valve leak    Medications:  Scheduled:  . apixaban  5 mg Oral BID  . nicotine  21 mg Transdermal Daily  . [START ON 08/16/2015] pneumococcal 23 valent vaccine  0.5 mL Intramuscular Tomorrow-1000  . sodium chloride  3 mL Intravenous Q12H   Infusions:    Assessment: Pt is a 74 yo male admitted for palpitations and Afib w/ RVR. Pt is currently on diltiazem drip. Pt also has mitral valve disease.   Plan:  Spoke with Dr. Benjie Karvonen about the extent of the patient's valvular involvement, as apixaban is only approved for nonvalvular afib. Per Dr. Benjie Karvonen cardiology is consulted and will provide their input on anticoagulation.  As for now, continue apixaban 5mg  PO BID for anticoagulation.  Will continue to monitor.   Vena Rua 08/15/2015,4:32 PM

## 2015-08-16 ENCOUNTER — Inpatient Hospital Stay: Payer: Medicare HMO

## 2015-08-16 ENCOUNTER — Inpatient Hospital Stay: Admit: 2015-08-16 | Payer: Medicare HMO

## 2015-08-16 ENCOUNTER — Inpatient Hospital Stay
Admit: 2015-08-16 | Discharge: 2015-08-16 | Disposition: A | Discharge status: Home / Self Care | Payer: Medicare HMO | Attending: Internal Medicine | Admitting: Internal Medicine

## 2015-08-16 LAB — BASIC METABOLIC PANEL
Anion gap: 6 (ref 5–15)
BUN: 16 mg/dL (ref 6–20)
CO2: 22 mmol/L (ref 22–32)
Calcium: 7.8 mg/dL — ABNORMAL LOW (ref 8.9–10.3)
Chloride: 110 mmol/L (ref 101–111)
Creatinine, Ser: 1.06 mg/dL (ref 0.61–1.24)
GFR calc Af Amer: >60 mL/min (ref >60)
GFR calc non Af Amer: >60 mL/min (ref >60)
Glucose, Bld: 112 mg/dL — ABNORMAL HIGH (ref 65–99)
Potassium: 3 mmol/L — ABNORMAL LOW (ref 3.5–5.1)
Sodium: 138 mmol/L (ref 135–145)

## 2015-08-16 LAB — TROPONIN I: Troponin I: <0.03 ng/mL (ref <0.031)

## 2015-08-16 LAB — CBC
HCT: 42.2 % (ref 40.0–52.0)
Hemoglobin: 14.3 g/dL (ref 13.0–18.0)
MCH: 31.1 pg (ref 26.0–34.0)
MCHC: 33.9 g/dL (ref 32.0–36.0)
MCV: 91.8 fL (ref 80.0–100.0)
Platelets: 139 10*3/uL — ABNORMAL LOW (ref 150–440)
RBC: 4.59 MIL/uL (ref 4.40–5.90)
RDW: 13.1 % (ref 11.5–14.5)
WBC: 6.4 10*3/uL (ref 3.8–10.6)

## 2015-08-16 MED ORDER — SODIUM CHLORIDE 0.9 % IV BOLUS (SEPSIS)
1000.0000 mL | Freq: Once | INTRAVENOUS | Status: AC
  Administered 2015-08-16: 1000 mL via INTRAVENOUS

## 2015-08-16 MED ORDER — DILTIAZEM HCL 60 MG PO TABS
60.0000 mg | ORAL_TABLET | Freq: Three times a day (TID) | ORAL | Status: DC
  Administered 2015-08-16 – 2015-08-18 (×6): 60 mg via ORAL
  Filled 2015-08-16 (×7): qty 1

## 2015-08-16 MED ORDER — DILTIAZEM HCL 25 MG/5ML IV SOLN: 10.0000 mg | Freq: Four times a day (QID) | INTRAVENOUS | Status: DC | PRN

## 2015-08-16 MED ORDER — POTASSIUM CHLORIDE CRYS ER 20 MEQ PO TBCR: 40.0000 meq | EXTENDED_RELEASE_TABLET | Freq: Once | ORAL | Status: DC

## 2015-08-16 MED ORDER — POTASSIUM CHLORIDE CRYS ER 20 MEQ PO TBCR
40.0000 meq | EXTENDED_RELEASE_TABLET | Freq: Two times a day (BID) | ORAL | Status: AC
  Administered 2015-08-16 (×2): 40 meq via ORAL
  Filled 2015-08-16 (×2): qty 2

## 2015-08-16 NOTE — Progress Notes (Signed)
Spoke with Dr. Lavetta Nielsen about Pt's BP 89/67. An order for 1L NS bolus given. Will continue to monitor.

## 2015-08-16 NOTE — Progress Notes (Signed)
SUBJECTIVE: Still mildly SOB.     PHYSICAL EXAM Filed Vitals:   08/16/15 0700 08/16/15 0800 08/16/15 0900 08/16/15 1037  BP: 111/77 120/71 108/85 111/100  Pulse: 77 120 77 116  Temp:      TempSrc:      Resp: 25 18 16    Height:      Weight:      SpO2: 97% 93% 96%    General:  No acute distress Lungs:  Decreased breath sounds Heart: Irregular, 3/6 holosystolic murmur.   Abdomen:  Positive bowel sounds, no rebound no guarding Extremities:  No edema  Neuro:  Nonfocal  LABS: Lab Results  Component Value Date   TROPONINI <0.03 08/16/2015   Results for orders placed or performed during the hospital encounter of 08/15/15 (from the past 24 hour(s))  CBC with Differential     Status: None   Collection Time: 08/15/15  1:11 PM  Result Value Ref Range   WBC 9.0 3.8 - 10.6 K/uL   RBC 5.33 4.40 - 5.90 MIL/uL   Hemoglobin 16.8 13.0 - 18.0 g/dL   HCT 49.0 40.0 - 52.0 %   MCV 92.1 80.0 - 100.0 fL   MCH 31.5 26.0 - 34.0 pg   MCHC 34.2 32.0 - 36.0 g/dL   RDW 13.2 11.5 - 14.5 %   Platelets 180 150 - 440 K/uL   Neutrophils Relative % 65 %   Neutro Abs 5.9 1.4 - 6.5 K/uL   Lymphocytes Relative 25 %   Lymphs Abs 2.2 1.0 - 3.6 K/uL   Monocytes Relative 8 %   Monocytes Absolute 0.7 0.2 - 1.0 K/uL   Eosinophils Relative 1 %   Eosinophils Absolute 0.1 0 - 0.7 K/uL   Basophils Relative 1 %   Basophils Absolute 0.1 0 - 0.1 K/uL  Basic metabolic panel     Status: Abnormal   Collection Time: 08/15/15  1:11 PM  Result Value Ref Range   Sodium 139 135 - 145 mmol/L   Potassium 3.8 3.5 - 5.1 mmol/L   Chloride 106 101 - 111 mmol/L   CO2 22 22 - 32 mmol/L   Glucose, Bld 156 (H) 65 - 99 mg/dL   BUN 19 6 - 20 mg/dL   Creatinine, Ser 1.43 (H) 0.61 - 1.24 mg/dL   Calcium 9.2 8.9 - 10.3 mg/dL   GFR calc non Af Amer 47 (L) >60 mL/min   GFR calc Af Amer 55 (L) >60 mL/min   Anion gap 11 5 - 15  Brain natriuretic peptide     Status: Abnormal   Collection Time: 08/15/15  1:11 PM  Result Value  Ref Range   B Natriuretic Peptide 555.0 (H) 0.0 - 100.0 pg/mL  Troponin I     Status: None   Collection Time: 08/15/15  1:11 PM  Result Value Ref Range   Troponin I <0.03 <0.031 ng/mL  APTT     Status: None   Collection Time: 08/15/15  1:11 PM  Result Value Ref Range   aPTT 34 24 - 36 seconds  Protime-INR     Status: None   Collection Time: 08/15/15  1:11 PM  Result Value Ref Range   Prothrombin Time 12.9 11.4 - 15.0 seconds   INR 0.95   TSH     Status: Abnormal   Collection Time: 08/15/15  1:11 PM  Result Value Ref Range   TSH 4.630 (H) 0.350 - 4.500 uIU/mL  MRSA PCR Screening     Status: None  Collection Time: 08/15/15  4:07 PM  Result Value Ref Range   MRSA by PCR NEGATIVE NEGATIVE  Troponin I     Status: None   Collection Time: 08/15/15  6:19 PM  Result Value Ref Range   Troponin I <0.03 <0.031 ng/mL  TSH     Status: None   Collection Time: 08/15/15  6:19 PM  Result Value Ref Range   TSH 3.078 0.350 - 4.500 uIU/mL  Troponin I     Status: None   Collection Time: 08/15/15 10:58 PM  Result Value Ref Range   Troponin I <0.03 <0.031 ng/mL  Troponin I     Status: None   Collection Time: 08/16/15  4:09 AM  Result Value Ref Range   Troponin I <0.03 <0.031 ng/mL  Basic metabolic panel     Status: Abnormal   Collection Time: 08/16/15  4:09 AM  Result Value Ref Range   Sodium 138 135 - 145 mmol/L   Potassium 3.0 (L) 3.5 - 5.1 mmol/L   Chloride 110 101 - 111 mmol/L   CO2 22 22 - 32 mmol/L   Glucose, Bld 112 (H) 65 - 99 mg/dL   BUN 16 6 - 20 mg/dL   Creatinine, Ser 1.06 0.61 - 1.24 mg/dL   Calcium 7.8 (L) 8.9 - 10.3 mg/dL   GFR calc non Af Amer >60 >60 mL/min   GFR calc Af Amer >60 >60 mL/min   Anion gap 6 5 - 15  CBC     Status: Abnormal   Collection Time: 08/16/15  4:09 AM  Result Value Ref Range   WBC 6.4 3.8 - 10.6 K/uL   RBC 4.59 4.40 - 5.90 MIL/uL   Hemoglobin 14.3 13.0 - 18.0 g/dL   HCT 42.2 40.0 - 52.0 %   MCV 91.8 80.0 - 100.0 fL   MCH 31.1 26.0 - 34.0 pg    MCHC 33.9 32.0 - 36.0 g/dL   RDW 13.1 11.5 - 14.5 %   Platelets 139 (L) 150 - 440 K/uL    Intake/Output Summary (Last 24 hours) at 08/16/15 1253 Last data filed at 08/16/15 0700  Gross per 24 hour  Intake  51.97 ml  Output   1525 ml  Net -1473.03 ml     ASSESSMENT AND PLAN:  ATRIAL FIB WITH RVR:  On Eliquis.  He did not want to have the AM dose of Lasix.  His rate is OK but not as controlled as we would like.  He had low BP so med titration is difficult.  He will likely need TEE DCCV on Monday.  Continue current therapy for now.  I discusses afib and MR with him.  Of note this is valvular atrial fib with his history of RF.  However, the patient does not want to take warfarin and would likely not be compliant with warfarin follow up.  We will need to make it clear to him that NOACs are not approved for valvular atrial fib.  I will supplement the potassium.   MR:  This has been moderate to severe.  He will need to have ongoing discussion with Dr. [name] about mitral surgery.   TTE done and results pending.    DYSPNEA:   Better but not at baseline.      Minus Breeding 08/16/2015 12:53 PM

## 2015-08-16 NOTE — Progress Notes (Signed)
2nd skin assessment RN - Juliann Pulse

## 2015-08-16 NOTE — Progress Notes (Signed)
*  PRELIMINARY RESULTS* Echocardiogram 2D Echocardiogram has been performed.  Michael Delacruz 08/16/2015, 2:05 PM

## 2015-08-16 NOTE — Plan of Care (Signed)
Problem: Consults Goal: Tobacco Cessation referral if indicated Outcome: Completed/Met Date Met:  08/16/15 Pt refuses

## 2015-08-16 NOTE — Progress Notes (Signed)
Dr. [name] was in with Pt. Cardizem drip infusing at 5mg /hr. Verbal orders to titrate Cardizem by 1mg  and to hold if Pt's HR remains low. Will continue to monitor.

## 2015-08-16 NOTE — Progress Notes (Signed)
[place] at [facility]                                                                                                                                                                                            Patient Demographics   Michael Delacruz, is a 74 y.o. male, DOB - 1941/03/13, GGY:[id number]  Admit date - 08/15/2015   Admitting Physician Bettey Costa, MD  Outpatient Primary MD for the patient is [name], MD   LOS - 1  Subjective: Patient's heart rate continues to be very labile. Cardizem drip was on hold. He has a history of severe mitral valve disorder and was recommended to have a repair in 2012 and had seen a CT surgeon but refused intervention.     Review of Systems:   CONSTITUTIONAL: No documented fever. No fatigue, weakness. No weight gain, no weight loss.  EYES: No blurry or double vision.  ENT: No tinnitus. No postnasal drip. No redness of the oropharynx.  RESPIRATORY: No cough, no wheeze, no hemoptysis. No dyspnea.  CARDIOVASCULAR: No chest pain. No orthopnea. Positive palpitations. Positive syncope.  GASTROINTESTINAL: No nausea, no vomiting or diarrhea. No abdominal pain. No melena or hematochezia.  GENITOURINARY: No dysuria or hematuria.  ENDOCRINE: No polyuria or nocturia. No heat or cold intolerance.  HEMATOLOGY: No anemia. No bruising. No bleeding.  INTEGUMENTARY: No rashes. No lesions.  MUSCULOSKELETAL: No arthritis. No swelling. No gout.  NEUROLOGIC: No numbness, tingling, or ataxia. No seizure-type activity.  PSYCHIATRIC: No anxiety. No insomnia. No ADD.    Vitals:   Filed Vitals:   08/16/15 0700 08/16/15 0800 08/16/15 0900 08/16/15 1037  BP: 111/77 120/71 108/85 111/100  Pulse: 77 120 77 116  Temp:      TempSrc:      Resp: 25 18 16    Height:      Weight:      SpO2: 97% 93% 96%     Wt Readings from Last 3 Encounters:  08/15/15 81 kg (178 lb 9.2 oz)  07/24/15 88.905 kg (196 lb)  02/05/15 86.864 kg (191  lb 8 oz)     Intake/Output Summary (Last 24 hours) at 08/16/15 1324 Last data filed at 08/16/15 0700  Gross per 24 hour  Intake  51.97 ml  Output   1525 ml  Net -1473.03 ml    Physical Exam:   GENERAL: Pleasant-appearing in no apparent distress.  HEAD, EYES, EARS, NOSE AND THROAT: Atraumatic, normocephalic. Extraocular muscles are intact. Pupils equal and reactive to light. Sclerae anicteric. No conjunctival injection. No oro-pharyngeal erythema.  NECK: Supple. There is no jugular venous distention.  No bruits, no lymphadenopathy, no thyromegaly.  HEART: Regular rate and rhythm,. Positive holosystolic murmur at the apex no rubs, no clicks.  LUNGS: Clear to auscultation bilaterally. No rales or rhonchi. No wheezes.  ABDOMEN: Soft, flat, nontender, nondistended. Has good bowel sounds. No hepatosplenomegaly appreciated.  EXTREMITIES: No evidence of any cyanosis, clubbing, or peripheral edema.  +2 pedal and radial pulses bilaterally.  NEUROLOGIC: The patient is alert, awake, and oriented x3 with no focal motor or sensory deficits appreciated bilaterally.  SKIN: Moist and warm with no rashes appreciated.  Psych: Not anxious, depressed LN: No inguinal LN enlargement    Antibiotics   Anti-infectives    None      Medications   Scheduled Meds: . apixaban  5 mg Oral BID  . diltiazem  60 mg Oral 3 times per day  . furosemide  20 mg Intravenous Daily  . metoprolol tartrate  12.5 mg Oral QID  . nicotine  21 mg Transdermal Daily  . pneumococcal 23 valent vaccine  0.5 mL Intramuscular Tomorrow-1000  . sodium chloride  3 mL Intravenous Q12H   Continuous Infusions:  PRN Meds:.acetaminophen **OR** acetaminophen, alum & mag hydroxide-simeth, diltiazem, ondansetron **OR** ondansetron (ZOFRAN) IV, senna-docusate   Data Review:   Micro Results Recent Results (from the past 240 hour(s))  MRSA PCR Screening     Status: None   Collection Time: 08/15/15  4:07 PM  Result Value Ref Range  Status   MRSA by PCR NEGATIVE NEGATIVE Final    Comment:        The GeneXpert MRSA Assay (FDA approved for NASAL specimens only), is one component of a comprehensive MRSA colonization surveillance program. It is not intended to diagnose MRSA infection nor to guide or monitor treatment for MRSA infections.     Radiology Reports Dg Chest 2 View  07/24/2015   CLINICAL DATA:  Left chest pain radiating to the neck and left arm.  EXAM: CHEST  2 VIEW  COMPARISON:  02/07/2011  FINDINGS: There is mild interstitial coarsening which could represent fibrosis or interstitial fluid. This is worsened from 02/07/2011. There is no alveolar opacity. There is no effusion. Heart size is upper normal an unchanged. Hilar and mediastinal contours are unremarkable.  IMPRESSION: Interstitial thickening due to fluid or fibrosis. No confluent airspace consolidation. No effusion.   Electronically Signed   By: Andreas Newport M.D.   On: 07/24/2015 06:49   Dg Chest Port 1 View  08/15/2015   CLINICAL DATA:  Tachycardia and shortness of breath onset 2 days ago, history COPD, coronary artery disease, and mitral regurgitation.  EXAM: PORTABLE CHEST - 1 VIEW  COMPARISON:  PA and lateral chest x-ray of July 24, 2015  FINDINGS: The lungs are well-expanded. There is no focal infiltrate. There is no pleural effusion. The interstitial markings are coarse but stable. The cardiac silhouette is enlarged. The pulmonary vascularity is not engorged. There is no pleural effusion. External pacemaker defibrillator pads are present. A cardiac rhythm recording device is present to the right of the mid thoracic spine.  IMPRESSION: COPD with mild cardiomegaly and mild pulmonary interstitial prominence. The interstitial prominence has improved since the previous study. There is no alveolar pneumonia.   Electronically Signed   By: David  Martinique M.D.   On: 08/15/2015 14:03     CBC  Recent Labs Lab 08/15/15 1311 08/16/15 0409  WBC 9.0 6.4   HGB 16.8 14.3  HCT 49.0 42.2  PLT 180 139*  MCV 92.1 91.8  MCH 31.5  31.1  MCHC 34.2 33.9  RDW 13.2 13.1  LYMPHSABS 2.2  --   MONOABS 0.7  --   EOSABS 0.1  --   BASOSABS 0.1  --     Chemistries   Recent Labs Lab 08/15/15 1311 08/16/15 0409  NA 139 138  K 3.8 3.0*  CL 106 110  CO2 22 22  GLUCOSE 156* 112*  BUN 19 16  CREATININE 1.43* 1.06  CALCIUM 9.2 7.8*   ------------------------------------------------------------------------------------------------------------------ estimated creatinine clearance is 71.2 mL/min (by C-G formula based on Cr of 1.06). ------------------------------------------------------------------------------------------------------------------ No results for input(s): HGBA1C in the last 72 hours. ------------------------------------------------------------------------------------------------------------------ No results for input(s): CHOL, HDL, LDLCALC, TRIG, CHOLHDL, LDLDIRECT in the last 72 hours. ------------------------------------------------------------------------------------------------------------------  Recent Labs  08/15/15 1819  TSH 3.078   ------------------------------------------------------------------------------------------------------------------ No results for input(s): VITAMINB12, FOLATE, FERRITIN, TIBC, IRON, RETICCTPCT in the last 72 hours.  Coagulation profile  Recent Labs Lab 08/15/15 1311  INR 0.95    No results for input(s): DDIMER in the last 72 hours.  Cardiac Enzymes  Recent Labs Lab 08/15/15 1819 08/15/15 2258 08/16/15 0409  TROPONINI <0.03 <0.03 <0.03   ------------------------------------------------------------------------------------------------------------------ Invalid input(s): POCBNP    Assessment & Plan   1. Atrial fibrillation and RVR: Likely due to mitral valve regurg start on Cardizem orally. Continue metoprolol. Currently on Eliquis will await cardiology evaluation since this  likely valvular A. fib will need to discontinue this.   2. Mitral valve heart murmur: Await echo to evaluate severity   3. Elevated blood sugar: Likely reactive follow blood sugar  4. Tobacco dependence: Patient was recommended to stop  5. Hypokalemia replace potassium     Code Status Orders        Start     Ordered   08/15/15 1617  Full code   Continuous     08/15/15 1616     Past for her to telemetry floor      Consults  cardiac DVT Prophylaxis  Eliquis  Lab Results  Component Value Date   PLT 139* 08/16/2015     Time Spent in minutes  35  Greater than 50% of time spent in care coordination and counseling.   Dustin Flock M.D on 08/16/2015 at 1:24 PM  Between 7am to 6pm - Pager - [phone]  After 6pm go to [email or web address] - password [email or web address] [facility]   Office  [phone]

## 2015-08-17 LAB — BASIC METABOLIC PANEL WITH GFR
Anion gap: 6 (ref 5–15)
BUN: 16 mg/dL (ref 6–20)
CO2: 22 mmol/L (ref 22–32)
Calcium: 8.6 mg/dL — ABNORMAL LOW (ref 8.9–10.3)
Chloride: 112 mmol/L — ABNORMAL HIGH (ref 101–111)
Creatinine, Ser: 1.18 mg/dL (ref 0.61–1.24)
GFR calc Af Amer: >60 mL/min
GFR calc non Af Amer: 60 mL/min — ABNORMAL LOW
Glucose, Bld: 114 mg/dL — ABNORMAL HIGH (ref 65–99)
Potassium: 4.1 mmol/L (ref 3.5–5.1)
Sodium: 140 mmol/L (ref 135–145)

## 2015-08-17 MED ORDER — METOPROLOL TARTRATE 25 MG PO TABS
25.0000 mg | ORAL_TABLET | Freq: Once | ORAL | Status: AC
  Administered 2015-08-17: 25 mg via ORAL
  Filled 2015-08-17: qty 1

## 2015-08-17 NOTE — Plan of Care (Signed)
Problem: Phase II Progression Outcomes Goal: Anticoagulation Therapy per MD order Outcome: Completed/Met Date Met:  08/17/15 On Eliquis

## 2015-08-17 NOTE — Progress Notes (Addendum)
Patient complaining of chest discomfort stating his "chest and neck are tingling all over" his "hands are shaking uncontrollably" and "something is not right". Heart rate is jumping from the 90's to 130's, not sustaining, but patient is symptomatic. Called Dr. Percival Spanish and orders received. Will give 25mg  metoprolol once.

## 2015-08-17 NOTE — Progress Notes (Signed)
[place] at [facility]                                                                                                                                                                                            Patient Demographics   Michael Delacruz, is a 74 y.o. male, DOB - 1941-02-22, ZOX:[id number]  Admit date - 08/15/2015   Admitting Physician Bettey Costa, MD  Outpatient Primary MD for the patient is [name], MD   LOS - 2  Subjective: Heart rate improved, however overnight has had multiple pauses. I ambulated him in the hall heart rate stays below 110s with activity. Patient refused Coumadin therapy.   Review of Systems:   CONSTITUTIONAL: No documented fever. No fatigue, weakness. No weight gain, no weight loss.  EYES: No blurry or double vision.  ENT: No tinnitus. No postnasal drip. No redness of the oropharynx.  RESPIRATORY: No cough, no wheeze, no hemoptysis. No dyspnea.  CARDIOVASCULAR: No chest pain. No orthopnea. Positive palpitations. Positive syncope.  GASTROINTESTINAL: No nausea, no vomiting or diarrhea. No abdominal pain. No melena or hematochezia.  GENITOURINARY: No dysuria or hematuria.  ENDOCRINE: No polyuria or nocturia. No heat or cold intolerance.  HEMATOLOGY: No anemia. No bruising. No bleeding.  INTEGUMENTARY: No rashes. No lesions.  MUSCULOSKELETAL: No arthritis. No swelling. No gout.  NEUROLOGIC: No numbness, tingling, or ataxia. No seizure-type activity.  PSYCHIATRIC: No anxiety. No insomnia. No ADD.    Vitals:   Filed Vitals:   08/16/15 2013 08/17/15 0532 08/17/15 1126 08/17/15 1128  BP: 106/90 112/60 134/74 115/74  Pulse: 70 70 71 73  Temp: 97.8 F (36.6 C) 97.9 F (36.6 C) 98 F (36.7 C)   TempSrc: Oral  Oral   Resp: 18 21 20    Height:      Weight:      SpO2: 99% 98% 99% 99%    Wt Readings from Last 3 Encounters:  08/15/15 81 kg (178 lb 9.2 oz)  07/24/15 88.905 kg (196 lb)  02/05/15 86.864 kg (191  lb 8 oz)     Intake/Output Summary (Last 24 hours) at 08/17/15 1206 Last data filed at 08/17/15 1022  Gross per 24 hour  Intake    123 ml  Output   1125 ml  Net  -1002 ml    Physical Exam:   GENERAL: Pleasant-appearing in no apparent distress.  HEAD, EYES, EARS, NOSE AND THROAT: Atraumatic, normocephalic. Extraocular muscles are intact. Pupils equal and reactive to light. Sclerae anicteric. No conjunctival injection. No oro-pharyngeal erythema.  NECK: Supple. There is no jugular venous distention. No bruits, no lymphadenopathy,  no thyromegaly.  HEART: Regular rate and rhythm,. Positive holosystolic murmur at the apex no rubs, no clicks.  LUNGS: Clear to auscultation bilaterally. No rales or rhonchi. No wheezes.  ABDOMEN: Soft, flat, nontender, nondistended. Has good bowel sounds. No hepatosplenomegaly appreciated.  EXTREMITIES: No evidence of any cyanosis, clubbing, or peripheral edema.  +2 pedal and radial pulses bilaterally.  NEUROLOGIC: The patient is alert, awake, and oriented x3 with no focal motor or sensory deficits appreciated bilaterally.  SKIN: Moist and warm with no rashes appreciated.  Psych: Not anxious, depressed LN: No inguinal LN enlargement    Antibiotics   Anti-infectives    None      Medications   Scheduled Meds: . apixaban  5 mg Oral BID  . diltiazem  60 mg Oral 3 times per day  . furosemide  20 mg Intravenous Daily  . nicotine  21 mg Transdermal Daily  . pneumococcal 23 valent vaccine  0.5 mL Intramuscular Tomorrow-1000  . sodium chloride  3 mL Intravenous Q12H   Continuous Infusions:  PRN Meds:.acetaminophen **OR** acetaminophen, alum & mag hydroxide-simeth, diltiazem, ondansetron **OR** ondansetron (ZOFRAN) IV, senna-docusate   Data Review:   Micro Results Recent Results (from the past 240 hour(s))  MRSA PCR Screening     Status: None   Collection Time: 08/15/15  4:07 PM  Result Value Ref Range Status   MRSA by PCR NEGATIVE NEGATIVE Final     Comment:        The GeneXpert MRSA Assay (FDA approved for NASAL specimens only), is one component of a comprehensive MRSA colonization surveillance program. It is not intended to diagnose MRSA infection nor to guide or monitor treatment for MRSA infections.     Radiology Reports Dg Chest 2 View  07/24/2015   CLINICAL DATA:  Left chest pain radiating to the neck and left arm.  EXAM: CHEST  2 VIEW  COMPARISON:  02/07/2011  FINDINGS: There is mild interstitial coarsening which could represent fibrosis or interstitial fluid. This is worsened from 02/07/2011. There is no alveolar opacity. There is no effusion. Heart size is upper normal an unchanged. Hilar and mediastinal contours are unremarkable.  IMPRESSION: Interstitial thickening due to fluid or fibrosis. No confluent airspace consolidation. No effusion.   Electronically Signed   By: Andreas Newport M.D.   On: 07/24/2015 06:49   Ct Head Wo Contrast  08/16/2015   CLINICAL DATA:  74 year old male with dizziness today. Prior history of stroke.  EXAM: CT HEAD WITHOUT CONTRAST  TECHNIQUE: Contiguous axial images were obtained from the base of the skull through the vertex without intravenous contrast.  COMPARISON:  Head CT 06/06/2010.  Brain MRI 06/07/2010.  FINDINGS: Physiologic calcifications in the basal ganglia bilaterally. Patchy and confluent areas of decreased attenuation are noted throughout the deep and periventricular white matter of the cerebral hemispheres bilaterally, compatible with chronic microvascular ischemic disease. No acute intracranial abnormalities. Specifically, no evidence of acute intracranial hemorrhage, no definite findings of acute/subacute cerebral ischemia, no mass, mass effect, hydrocephalus or abnormal intra or extra-axial fluid collections. Visualized paranasal sinuses and mastoids are well pneumatized. No acute displaced skull fractures are identified.  IMPRESSION: 1. No acute intracranial abnormalities. 2. Mild  chronic microvascular ischemic changes in the cerebral white matter, as above.   Electronically Signed   By: Vinnie Langton M.D.   On: 08/16/2015 14:09   Dg Chest Port 1 View  08/15/2015   CLINICAL DATA:  Tachycardia and shortness of breath onset 2 days ago, history COPD, coronary  artery disease, and mitral regurgitation.  EXAM: PORTABLE CHEST - 1 VIEW  COMPARISON:  PA and lateral chest x-ray of July 24, 2015  FINDINGS: The lungs are well-expanded. There is no focal infiltrate. There is no pleural effusion. The interstitial markings are coarse but stable. The cardiac silhouette is enlarged. The pulmonary vascularity is not engorged. There is no pleural effusion. External pacemaker defibrillator pads are present. A cardiac rhythm recording device is present to the right of the mid thoracic spine.  IMPRESSION: COPD with mild cardiomegaly and mild pulmonary interstitial prominence. The interstitial prominence has improved since the previous study. There is no alveolar pneumonia.   Electronically Signed   By: David  Martinique M.D.   On: 08/15/2015 14:03     CBC  Recent Labs Lab 08/15/15 1311 08/16/15 0409  WBC 9.0 6.4  HGB 16.8 14.3  HCT 49.0 42.2  PLT 180 139*  MCV 92.1 91.8  MCH 31.5 31.1  MCHC 34.2 33.9  RDW 13.2 13.1  LYMPHSABS 2.2  --   MONOABS 0.7  --   EOSABS 0.1  --   BASOSABS 0.1  --     Chemistries   Recent Labs Lab 08/15/15 1311 08/16/15 0409 08/17/15 0453  NA 139 138 140  K 3.8 3.0* 4.1  CL 106 110 112*  CO2 22 22 22   GLUCOSE 156* 112* 114*  BUN 19 16 16   CREATININE 1.43* 1.06 1.18  CALCIUM 9.2 7.8* 8.6*   ------------------------------------------------------------------------------------------------------------------ estimated creatinine clearance is 64 mL/min (by C-G formula based on Cr of 1.18). ------------------------------------------------------------------------------------------------------------------ No results for input(s): HGBA1C in the last 72  hours. ------------------------------------------------------------------------------------------------------------------ No results for input(s): CHOL, HDL, LDLCALC, TRIG, CHOLHDL, LDLDIRECT in the last 72 hours. ------------------------------------------------------------------------------------------------------------------  Recent Labs  08/15/15 1819  TSH 3.078   ------------------------------------------------------------------------------------------------------------------ No results for input(s): VITAMINB12, FOLATE, FERRITIN, TIBC, IRON, RETICCTPCT in the last 72 hours.  Coagulation profile  Recent Labs Lab 08/15/15 1311  INR 0.95    No results for input(s): DDIMER in the last 72 hours.  Cardiac Enzymes  Recent Labs Lab 08/15/15 1819 08/15/15 2258 08/16/15 0409  TROPONINI <0.03 <0.03 <0.03   ------------------------------------------------------------------------------------------------------------------ Invalid input(s): POCBNP    Assessment & Plan   1. Atrial fibrillation and RVR:due to mitral valve regurg continue Cardizem , disc Continue metoprolo due to pauses l. Currently on Eliquis cardiology discussed this with the patient he is refusing Coumadin therapy.  2. Mitral valve heart murmur: Moderate to severe regurg patient willing to have reevaluate CT surgery  3. Elevated blood sugar: Likely reactive follow blood sugar  4. Tobacco dependence: Patient was recommended to stop  5. Hypokalemia replace potassium     Code Status Orders        Start     Ordered   08/15/15 1617  Full code   Continuous     08/15/15 1616     Past for her to telemetry floor      Consults  cardiac DVT Prophylaxis  Eliquis  Lab Results  Component Value Date   PLT 139* 08/16/2015     Time Spent in minutes  35  Greater than 50% of time spent in care coordination and counseling.   Dustin Flock M.D on 08/17/2015 at 12:06 PM  Between 7am to 6pm - Pager -  [phone]  After 6pm go to [email or web address] - password [email or web address] [facility]   Office  [phone]

## 2015-08-17 NOTE — Progress Notes (Signed)
    SUBJECTIVE: Still mildly SOB.     PHYSICAL EXAM Filed Vitals:   08/16/15 2013 08/17/15 0532 08/17/15 1126 08/17/15 1128  BP: 106/90 112/60 134/74 115/74  Pulse: 70 70 71 73  Temp: 97.8 F (36.6 C) 97.9 F (36.6 C) 98 F (36.7 C)   TempSrc: Oral  Oral   Resp: 18 21 20    Height:      Weight:      SpO2: 99% 98% 99% 99%   General:  No acute distress Lungs:  Decreased breath sounds Heart: Irregular, 3/6 holosystolic murmur.   Abdomen:  Positive bowel sounds, no rebound no guarding Extremities:  No edema  Neuro:  Nonfocal  LABS: Lab Results  Component Value Date   TROPONINI <0.03 08/16/2015   Results for orders placed or performed during the hospital encounter of 08/15/15 (from the past 24 hour(s))  Basic metabolic panel     Status: Abnormal   Collection Time: 08/17/15  4:53 AM  Result Value Ref Range   Sodium 140 135 - 145 mmol/L   Potassium 4.1 3.5 - 5.1 mmol/L   Chloride 112 (H) 101 - 111 mmol/L   CO2 22 22 - 32 mmol/L   Glucose, Bld 114 (H) 65 - 99 mg/dL   BUN 16 6 - 20 mg/dL   Creatinine, Ser 1.18 0.61 - 1.24 mg/dL   Calcium 8.6 (L) 8.9 - 10.3 mg/dL   GFR calc non Af Amer 60 (L) >60 mL/min   GFR calc Af Amer >60 >60 mL/min   Anion gap 6 5 - 15    Intake/Output Summary (Last 24 hours) at 08/17/15 1412 Last data filed at 08/17/15 1300  Gross per 24 hour  Intake    123 ml  Output   1125 ml  Net  -1002 ml     ASSESSMENT AND PLAN:  ATRIAL FIB WITH RVR:  On Eliquis.  He has been taking this since admission.  He has not wanted further Lasix since initial dose on admission.  Rate is slower and in fact the beta blocker has been discontinued.  f Lasix.  This is valvular atrial fib with his history of RF.  However, the patient does not want to take warfarin and would likely not be compliant with warfarin follow up.  I had a long discussion with the patient and his wife that this is an "off label" use of NOAC.  He will need TEE/DCCV.  I will make NPO after MN and  defer further plans to Dr. [name] in the AM.    MR:  This has been moderate to severe.  He will need to have ongoing discussion with Dr. [name] about mitral surgery.   Moderately severe MR. Unchanged.    DYSPNEA:   Better but still not at baseline.      Minus Breeding 08/17/2015 2:12 PM

## 2015-08-17 NOTE — Progress Notes (Signed)
Patient's heart rate is still jumping around going up to the 130's for a couple of seconds and coming back down to low 100's, after taking 25 of PO metoprolol. Patient can still feel it, but it has improved per the patient. He is not as nervous and shaky at this time. Will continue to monitor and pass on in report to night shift.

## 2015-08-18 ENCOUNTER — Inpatient Hospital Stay: Payer: Medicare HMO | Admitting: *Deleted

## 2015-08-18 ENCOUNTER — Telehealth: Payer: Self-pay

## 2015-08-18 ENCOUNTER — Encounter
Admission: EM | Disposition: A | Discharge status: Home / Self Care | Payer: Self-pay | Source: Home / Self Care | Attending: Internal Medicine

## 2015-08-18 ENCOUNTER — Ambulatory Visit: Payer: Medicare PPO | Admitting: Cardiovascular Disease

## 2015-08-18 ENCOUNTER — Encounter: Payer: Self-pay | Admitting: *Deleted

## 2015-08-18 ENCOUNTER — Inpatient Hospital Stay (HOSPITAL_COMMUNITY)
Admit: 2015-08-18 | Discharge: 2015-08-18 | Disposition: A | Discharge status: Home / Self Care | Payer: Medicare HMO | Attending: Physician Assistant | Admitting: Physician Assistant

## 2015-08-18 DIAGNOSIS — I4891 Unspecified atrial fibrillation: Secondary | ICD-10-CM

## 2015-08-18 HISTORY — PX: ELECTROPHYSIOLOGIC STUDY: SHX172A

## 2015-08-18 HISTORY — PX: TEE WITHOUT CARDIOVERSION: SHX5443

## 2015-08-18 SURGERY — ECHOCARDIOGRAM, TRANSESOPHAGEAL
Anesthesia: General

## 2015-08-18 MED ORDER — SODIUM CHLORIDE 0.9 % IJ SOLN: 3.0000 mL | INTRAMUSCULAR | Status: DC | PRN

## 2015-08-18 MED ORDER — FENTANYL CITRATE (PF) 100 MCG/2ML IJ SOLN
INTRAMUSCULAR | Status: DC | PRN
  Administered 2015-08-18: 50 ug via INTRAVENOUS

## 2015-08-18 MED ORDER — SODIUM CHLORIDE 0.9 % IV SOLN
INTRAVENOUS | Status: DC
  Administered 2015-08-18: 12:00:00 via INTRAVENOUS

## 2015-08-18 MED ORDER — MIDAZOLAM HCL 2 MG/2ML IJ SOLN
INTRAMUSCULAR | Status: DC | PRN
  Administered 2015-08-18: 1 mg via INTRAVENOUS

## 2015-08-18 MED ORDER — PROPOFOL 10 MG/ML IV BOLUS
INTRAVENOUS | Status: DC | PRN
  Administered 2015-08-18: 40 mg via INTRAVENOUS
  Administered 2015-08-18: 20 mg via INTRAVENOUS
  Administered 2015-08-18 (×2): 40 mg via INTRAVENOUS
  Administered 2015-08-18: 20 mg via INTRAVENOUS

## 2015-08-18 MED ORDER — BUTAMBEN-TETRACAINE-BENZOCAINE 2-2-14 % EX AERO
INHALATION_SPRAY | CUTANEOUS | Status: AC
  Filled 2015-08-18: qty 20

## 2015-08-18 MED ORDER — APIXABAN 5 MG PO TABS: 5.0000 mg | ORAL_TABLET | Freq: Two times a day (BID) | ORAL | Status: DC

## 2015-08-18 MED ORDER — LIDOCAINE VISCOUS 2 % MT SOLN
OROMUCOSAL | Status: AC
  Filled 2015-08-18: qty 15

## 2015-08-18 MED ORDER — SODIUM CHLORIDE 0.9 % IJ SOLN: 3.0000 mL | Freq: Two times a day (BID) | INTRAMUSCULAR | Status: DC

## 2015-08-18 MED ORDER — SODIUM CHLORIDE 0.9 % IV SOLN: 250.0000 mL | INTRAVENOUS | Status: DC

## 2015-08-18 MED ORDER — DILTIAZEM HCL ER COATED BEADS 120 MG PO CP24: 120.0000 mg | ORAL_CAPSULE | Freq: Every day | ORAL | Status: DC

## 2015-08-18 MED ORDER — PREDNISONE 20 MG PO TABS
40.0000 mg | ORAL_TABLET | Freq: Once | ORAL | Status: DC
  Filled 2015-08-18: qty 2

## 2015-08-18 NOTE — Telephone Encounter (Signed)
-----   Message from Blain Pais sent at 08/18/2015  8:46 AM EDT ----- Regarding: tcm/ph 09/12/2015 2:30 Christell Faith, PA

## 2015-08-18 NOTE — Anesthesia Preprocedure Evaluation (Addendum)
Anesthesia Evaluation  Patient identified by MRN, date of birth, ID band Patient awake    Reviewed: Allergy & Precautions, NPO status , Patient's Chart, lab work & pertinent test results  Airway Mallampati: II  TM Distance: >3 FB Neck ROM: Full    Dental  (+) Upper Dentures, Lower Dentures   Pulmonary shortness of breath and with exertion, asthma , COPD COPD inhaler, Current Smoker,  breath sounds clear to auscultation        Cardiovascular Exercise Tolerance: Good + angina at rest + CAD and + DOE + dysrhythmias Atrial Fibrillation + Valvular Problems/Murmurs MVP Rhythm:Irregular Rate:Tachycardia     Neuro/Psych    GI/Hepatic   Endo/Other    Renal/GU      Musculoskeletal   Abdominal (+)  Abdomen: soft.    Peds  Hematology   Anesthesia Other Findings   Reproductive/Obstetrics                            Anesthesia Physical Anesthesia Plan  ASA: III  Anesthesia Plan: General   Post-op Pain Management:    Induction: Intravenous  Airway Management Planned: Mask  Additional Equipment:   Intra-op Plan:   Post-operative Plan:   Informed Consent: I have reviewed the patients History and Physical, chart, labs and discussed the procedure including the risks, benefits and alternatives for the proposed anesthesia with the patient or authorized representative who has indicated his/her understanding and acceptance.     Plan Discussed with: CRNA  Anesthesia Plan Comments: (On elequis.)        Anesthesia Quick Evaluation

## 2015-08-18 NOTE — Discharge Instructions (Signed)

## 2015-08-18 NOTE — Progress Notes (Signed)
    Spoke with patient this morning prior to his TEE/DCCV. He had several questions regarding his mitral valve disease and the evaluation leading up to his impending repair in the future. Explained to him in detail the evaluation process. He is agreeable to move forward in the future. He also had concerns regarding our concern over his tobacco abuse. He reports it should be his business if he wants to smoke or not and not ours. He was upset we were giving him tobacco abuse counseling this morning. No further questions or concerns.    Christell Faith, PA-C 08/18/2015 1:12 PM

## 2015-08-18 NOTE — Progress Notes (Signed)
Patient: Michael Delacruz / Admit Date: 08/15/2015 / Date of Encounter: 08/18/2015, 8:11 AM   Subjective: Still SOB with ambulation this morning. He is for TEE/DCCV at noon today. No palpitations, chest pain, diaphoresis, presyncope, or syncope. Echo showed EF 60-65%, mild LVH, no RWMA, aortic root was mildly dilated, moderately calcified mitral annulus, moderate to severe mitral regurgitation, severely dilated LA.   Review of Systems: Review of Systems  Constitutional: Positive for malaise/fatigue. Negative for fever, chills, weight loss and diaphoresis.  HENT: Negative for congestion.   Eyes: Negative for discharge and redness.  Respiratory: Positive for shortness of breath. Negative for cough, hemoptysis, sputum production and wheezing.        SOB with ambulation  Cardiovascular: Negative for chest pain, palpitations, orthopnea, claudication, leg swelling and PND.  Gastrointestinal: Negative for heartburn, nausea, vomiting, blood in stool and melena.  Genitourinary: Negative for hematuria.  Musculoskeletal: Negative for myalgias and falls.  Skin: Negative for rash.  Neurological: Positive for weakness. Negative for sensory change, speech change and focal weakness.  Endo/Heme/Allergies: Does not bruise/bleed easily.  Psychiatric/Behavioral: Positive for substance abuse. Negative for depression and suicidal ideas. The patient is not nervous/anxious.        Ongoing tobacco abuse    Objective: Telemetry: Afib, rates in the 40's to 140's, rare ventricular bigeminy  Physical Exam: Blood pressure 113/68, pulse 54, temperature 98.3 F (36.8 C), temperature source Oral, resp. rate 18, height 6\' 1"  (1.854 m), weight 178 lb 9.2 oz (81 kg), SpO2 98 %. Body mass index is 23.56 kg/(m^2). General: Well developed, well nourished, in no acute distress. Head: Normocephalic, atraumatic, sclera non-icteric, no xanthomas, nares are without discharge. Neck: Negative for carotid bruits. JVP not  elevated. Lungs: Clear bilaterally to auscultation without wheezes, rales, or rhonchi. Breathing is unlabored. Heart: Irregularly-irregular, S1 S2, III/VI systolic murmurs at apex. No rubs, or gallops.  Abdomen: Soft, non-tender, non-distended with normoactive bowel sounds. No rebound/guarding. Extremities: No clubbing or cyanosis. No edema. Distal pedal pulses are 2+ and equal bilaterally. Neuro: Alert and oriented X 3. Moves all extremities spontaneously. Psych:  Responds to questions appropriately with a normal affect.   Intake/Output Summary (Last 24 hours) at 08/18/15 0811 Last data filed at 08/18/15 0755  Gross per 24 hour  Intake    483 ml  Output   1025 ml  Net   -542 ml    Inpatient Medications:  . apixaban  5 mg Oral BID  . diltiazem  60 mg Oral 3 times per day  . furosemide  20 mg Intravenous Daily  . nicotine  21 mg Transdermal Daily  . pneumococcal 23 valent vaccine  0.5 mL Intramuscular Tomorrow-1000  . sodium chloride  3 mL Intravenous Q12H   Infusions:    Labs:  Recent Labs  08/16/15 0409 08/17/15 0453  NA 138 140  K 3.0* 4.1  CL 110 112*  CO2 22 22  GLUCOSE 112* 114*  BUN 16 16  CREATININE 1.06 1.18  CALCIUM 7.8* 8.6*   No results for input(s): AST, ALT, ALKPHOS, BILITOT, PROT, ALBUMIN in the last 72 hours.  Recent Labs  08/15/15 1311 08/16/15 0409  WBC 9.0 6.4  NEUTROABS 5.9  --   HGB 16.8 14.3  HCT 49.0 42.2  MCV 92.1 91.8  PLT 180 139*    Recent Labs  08/15/15 1311 08/15/15 1819 08/15/15 2258 08/16/15 0409  TROPONINI <0.03 <0.03 <0.03 <0.03   Invalid input(s): POCBNP No results for input(s): HGBA1C in the  last 72 hours.   Weights: Filed Weights   08/15/15 1307 08/15/15 1625  Weight: 195 lb (88.451 kg) 178 lb 9.2 oz (81 kg)     Radiology/Studies:  Dg Chest 2 View  07/24/2015   CLINICAL DATA:  Left chest pain radiating to the neck and left arm.  EXAM: CHEST  2 VIEW  COMPARISON:  02/07/2011  FINDINGS: There is mild  interstitial coarsening which could represent fibrosis or interstitial fluid. This is worsened from 02/07/2011. There is no alveolar opacity. There is no effusion. Heart size is upper normal an unchanged. Hilar and mediastinal contours are unremarkable.  IMPRESSION: Interstitial thickening due to fluid or fibrosis. No confluent airspace consolidation. No effusion.   Electronically Signed   By: Andreas Newport M.D.   On: 07/24/2015 06:49   Ct Head Wo Contrast  08/16/2015   CLINICAL DATA:  74 year old male with dizziness today. Prior history of stroke.  EXAM: CT HEAD WITHOUT CONTRAST  TECHNIQUE: Contiguous axial images were obtained from the base of the skull through the vertex without intravenous contrast.  COMPARISON:  Head CT 06/06/2010.  Brain MRI 06/07/2010.  FINDINGS: Physiologic calcifications in the basal ganglia bilaterally. Patchy and confluent areas of decreased attenuation are noted throughout the deep and periventricular white matter of the cerebral hemispheres bilaterally, compatible with chronic microvascular ischemic disease. No acute intracranial abnormalities. Specifically, no evidence of acute intracranial hemorrhage, no definite findings of acute/subacute cerebral ischemia, no mass, mass effect, hydrocephalus or abnormal intra or extra-axial fluid collections. Visualized paranasal sinuses and mastoids are well pneumatized. No acute displaced skull fractures are identified.  IMPRESSION: 1. No acute intracranial abnormalities. 2. Mild chronic microvascular ischemic changes in the cerebral white matter, as above.   Electronically Signed   By: Vinnie Langton M.D.   On: 08/16/2015 14:09   Dg Chest Port 1 View  08/15/2015   CLINICAL DATA:  Tachycardia and shortness of breath onset 2 days ago, history COPD, coronary artery disease, and mitral regurgitation.  EXAM: PORTABLE CHEST - 1 VIEW  COMPARISON:  PA and lateral chest x-ray of July 24, 2015  FINDINGS: The lungs are well-expanded. There is  no focal infiltrate. There is no pleural effusion. The interstitial markings are coarse but stable. The cardiac silhouette is enlarged. The pulmonary vascularity is not engorged. There is no pleural effusion. External pacemaker defibrillator pads are present. A cardiac rhythm recording device is present to the right of the mid thoracic spine.  IMPRESSION: COPD with mild cardiomegaly and mild pulmonary interstitial prominence. The interstitial prominence has improved since the previous study. There is no alveolar pneumonia.   Electronically Signed   By: David  Martinique M.D.   On: 08/15/2015 14:03     Assessment and Plan  74 y.o. 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  1. New onset Afib with RVR: -Currently rate controlled in the 70's at rest, with minimal ambulation rates will become tachycardic into the 1-teens to 130's -Continued symptoms of dyspnea with exertion -He is for TEE/DCCV today -Continue diltiazem 60 mg q 8 hours -Continue metoprolol prn -Agree with Eliquis 5 mg bid, risks of anticoagulation discussed with  patient in detail, as he does not want to take Coumadin at all, off label usage with valvular Afib -CHADSVASc at least 4 (CHF, age x 1, stroke) giving him an estimated annual stroke risk of 4.0% -Echo as above -Has a long history of tachy-palpitations -Last ischemic evaluation was in 2012 as part of his pre-op evaluation for possible mitral valve repair/replacement  -Could plan for outpatient nuclear stress testing  2. Moderate to severe mitral regurgitation: -TEE today to assess valvular pathology  -Has been having  ongoing discussions with Dr. [name] regarding surgery -Will also need updating right and left cardiac cath when surgery is indicated -Continue current medications   Signed, Christell Faith, PA-C Pager: [phone] 08/18/2015, 8:11 AM

## 2015-08-18 NOTE — Care Management Note (Signed)
Case Management Note  Patient Details  Name: Michael Delacruz MRN: [id number] Date of Birth: 07-Jul-1941  Subjective/Objective:       No home health needs identified. Provided [name] with a 30 day free Eliquis coupon and explained to him how to use it.              Action/Plan:   Expected Discharge Date:                  Expected Discharge Plan:     In-House Referral:     Discharge planning Services     Post Acute Care Choice:    Choice offered to:     DME Arranged:    DME Agency:     HH Arranged:    [place] Agency:     Status of Service:     Medicare Important Message Given:  Yes-second notification given Date Medicare IM Given:    Medicare IM give by:    Date Additional Medicare IM Given:    Additional Medicare Important Message give by:     If discussed at [place] of Stay Meetings, dates discussed:    Additional Comments:  Magan Winnett A, RN 08/18/2015, 3:53 PM

## 2015-08-18 NOTE — Telephone Encounter (Signed)
Pt is still in the hospital. 

## 2015-08-18 NOTE — Anesthesia Postprocedure Evaluation (Signed)
  Anesthesia Post-op Note  Patient: Michael Delacruz  Procedure(s) Performed: Procedure(s): TRANSESOPHAGEAL ECHOCARDIOGRAM (TEE) (N/A) CARDIOVERSION (N/A)  Anesthesia type:General  Patient location: PACU  Post pain: Pain level controlled  Post assessment: Post-op Vital signs reviewed, Patient's Cardiovascular Status Stable, Respiratory Function Stable, Patent Airway and No signs of Nausea or vomiting  Post vital signs: Reviewed and stable  Last Vitals:  Filed Vitals:   08/18/15 1241  BP: 105/77  Pulse:   Temp: 37 C  Resp: 17    Level of consciousness: awake, alert  and patient cooperative  Complications: No apparent anesthesia complications

## 2015-08-18 NOTE — Telephone Encounter (Signed)
Dr. [name] asks that I make Christell Faith, PA aware, as Dr. [name] is in clinic this am.  Thurmond Butts will stop by pt's room and speak w/ him.

## 2015-08-18 NOTE — Telephone Encounter (Signed)
Pt states he is getting ready to get his heart shocked. States he would like to talk with Dr. [name] before this procedure. He would not tell me what it was regarding. States it is just between him and Dr. [name].

## 2015-08-18 NOTE — Care Management Important Message (Signed)
Important Message  Patient Details  Name: Kosta Schnitzler MRN: [id number] Date of Birth: 1941/04/30   Medicare Important Message Given:  Yes-second notification given    Juliann Pulse A Allmond 08/18/2015, 10:09 AM

## 2015-08-18 NOTE — Transfer of Care (Signed)
Immediate Anesthesia Transfer of Care Note  Patient: Michael Delacruz  Procedure(s) Performed: Procedure(s): TRANSESOPHAGEAL ECHOCARDIOGRAM (TEE) (N/A) CARDIOVERSION (N/A)  Patient Location: PACU  Anesthesia Type:General  Level of Consciousness: sedated  Airway & Oxygen Therapy: Patient Spontanous Breathing and Patient connected to nasal cannula oxygen  Post-op Assessment: Report given to RN and Post -op Vital signs reviewed and stable  Post vital signs: Reviewed and stable  Last Vitals:  Filed Vitals:   08/18/15 1241  BP: 105/77  Pulse:   Temp: 37 C  Resp: 17    Complications: No apparent anesthesia complications

## 2015-08-18 NOTE — CV Procedure (Signed)
Cardioversion note: A standard informed consent was obtained. Timeout was performed. The pads were placed in the anterior posterior fashion. The patient was given propofol by the anesthesia team. TEE was performed and showed no evidence of thrombus.  Successful cardioversion was performed with a 200 J. The patient converted to sinus rhythm. Pre-and post EKGs were reviewed. The patient tolerated the procedure with no immediate complications.  Recommendations: Continue anticoagulation. If the patient continues to be in sinus rhythm, he can be discharged home on diltiazem extended release 120 mg once daily and Eliquis 5 mg twice daily. He should follow-up with Dr.Gollan within 2 weeks. Recommend evaluation for mitral valve repair.

## 2015-08-18 NOTE — Progress Notes (Signed)
Pt. Discharged home with spouse IV removed pt. Tolerated well. Discharge teaching done with teach back

## 2015-08-18 NOTE — Progress Notes (Signed)
*  PRELIMINARY RESULTS* Echocardiogram 2D Echocardiogram has been performed.  Michael Delacruz 08/18/2015, 12:39 PM

## 2015-08-19 NOTE — Discharge Summary (Signed)
Michael Delacruz [place], 74 y.o., DOB 08/25/1941, MRN [id number]. Admission date: 08/15/2015 Discharge Date 08/19/2015 Primary MD [name], MD Admitting Physician Bettey Costa, MD  Admission Diagnosis  Atrial fibrillation with RVR [I48.91]  Discharge Diagnosis   Active Problems:   Atrial fibrillation   Atrial fibrillation with RVR   Centrilobular emphysema   Shortness of breath    Moderate to severe mitral regurg Nicotine addiction        Michael Delacruz is a 74 y.o. male with a known history of valvular disorder who presents with palpitations and shortness of breath.[name]  He presented to emergency room in [place] with RVR. He started on IV Cardizem drip. Patient has a history of mitral valve regurg that he was supposed to have replaced but he refused that in 2012. Patient had a echo repeated which showed moderate to severe mitral regurg. He was seen by cardiology. They recommended doing Coumadin for anticoagulation however patient refused Coumadin so he was treated with Eliquis. And explained that this is not optimal and nonlabaed use. He he understands this and still wants this to be used. His heart rate is improved. Patient underwent a TEE with cardioversion he underwent in a sinus rhythm. He was recommended to continue diltiazem and Eliquis.          Consults  cardiology  Significant Tests:  See full reports for all details    Dg Chest 2 View  07/24/2015   CLINICAL DATA:  Left chest pain radiating to the neck and left arm.  EXAM: CHEST  2 VIEW  COMPARISON:  02/07/2011  FINDINGS: There is mild interstitial coarsening which could represent fibrosis or interstitial fluid. This is worsened from 02/07/2011. There is no alveolar opacity. There is no effusion. Heart size is upper normal an unchanged. Hilar and mediastinal contours are unremarkable.  IMPRESSION: Interstitial thickening due to fluid or fibrosis. No confluent airspace consolidation. No effusion.    Electronically Signed   By: Andreas Newport M.D.   On: 07/24/2015 06:49   Ct Head Wo Contrast  08/16/2015   CLINICAL DATA:  74 year old male with dizziness today. Prior history of stroke.  EXAM: CT HEAD WITHOUT CONTRAST  TECHNIQUE: Contiguous axial images were obtained from the base of the skull through the vertex without intravenous contrast.  COMPARISON:  Head CT 06/06/2010.  Brain MRI 06/07/2010.  FINDINGS: Physiologic calcifications in the basal ganglia bilaterally. Patchy and confluent areas of decreased attenuation are noted throughout the deep and periventricular white matter of the cerebral hemispheres bilaterally, compatible with chronic microvascular ischemic disease. No acute intracranial abnormalities. Specifically, no evidence of acute intracranial hemorrhage, no definite findings of acute/subacute cerebral ischemia, no mass, mass effect, hydrocephalus or abnormal intra or extra-axial fluid collections. Visualized paranasal sinuses and mastoids are well pneumatized. No acute displaced skull fractures are identified.  IMPRESSION: 1. No acute intracranial abnormalities. 2. Mild chronic microvascular ischemic changes in the cerebral white matter, as above.   Electronically Signed   By: Vinnie Langton M.D.   On: 08/16/2015 14:09   Dg Chest Michael 1 View  08/15/2015   CLINICAL DATA:  Tachycardia and shortness of breath onset 2 days ago, history COPD, coronary artery disease, and mitral regurgitation.  EXAM: PORTABLE CHEST - 1 VIEW  COMPARISON:  PA and lateral chest x-ray of July 24, 2015  FINDINGS: The lungs are well-expanded. There is no focal infiltrate. There is no pleural effusion. The interstitial markings are coarse but stable. The cardiac silhouette is enlarged. The  pulmonary vascularity is not engorged. There is no pleural effusion. External pacemaker defibrillator pads are present. A cardiac rhythm recording device is present to the right of the mid thoracic spine.  IMPRESSION: COPD with  mild cardiomegaly and mild pulmonary interstitial prominence. The interstitial prominence has improved since the previous study. There is no alveolar pneumonia.   Electronically Signed   By: David  Martinique M.D.   On: 08/15/2015 14:03       Today   Subjective:   Michael Delacruz  patient is very nervous about his TEE later today continues to have some shortness of breath with activity heart rate better  Objective:   Blood pressure 127/83, pulse 84, temperature 98.6 F (37 C), temperature source Oral, resp. rate 17, height 6\' 1"  (1.854 m), weight 81 kg (178 lb 9.2 oz), SpO2 97 %.  .  Intake/Output Summary (Last 24 hours) at 08/19/15 1245 Last data filed at 08/18/15 1400  Gross per 24 hour  Intake      0 ml  Output      0 ml  Net      0 ml    Exam VITAL SIGNS: Blood pressure 127/83, pulse 84, temperature 98.6 F (37 C), temperature source Oral, resp. rate 17, height 6\' 1"  (1.854 m), weight 81 kg (178 lb 9.2 oz), SpO2 97 %.  GENERAL:  74 y.o.-year-old patient lying in the bed with no acute distress.  EYES: Pupils equal, round, reactive to light and accommodation. No scleral icterus. Extraocular muscles intact.  HEENT: Head atraumatic, normocephalic. Oropharynx and nasopharynx clear.  NECK:  Supple, no jugular venous distention. No thyroid enlargement, no tenderness.  LUNGS: Normal breath sounds bilaterally, no wheezing, rales,rhonchi or crepitation. No use of accessory muscles of respiration.  CARDIOVASCULAR: Irregularly irregular heart rhythm ABDOMEN: Soft, nontender, nondistended. Bowel sounds present. No organomegaly or mass.  EXTREMITIES: No pedal edema, cyanosis, or clubbing.  NEUROLOGIC: Cranial nerves II through XII are intact. Muscle strength 5/5 in all extremities. Sensation intact. Gait not checked.  PSYCHIATRIC: The patient is alert and oriented x 3.  SKIN: No obvious rash, lesion, or ulcer.   Data Review     CBC w Diff: Lab Results  Component Value Date   WBC 6.4  08/16/2015   WBC 6.9 01/24/2015   HGB 14.3 08/16/2015   HGB 14.6 01/24/2015   HCT 42.2 08/16/2015   HCT 42.9 01/24/2015   PLT 139* 08/16/2015   PLT 150 01/24/2015   LYMPHOPCT 25 08/15/2015   LYMPHOPCT 23.0 01/24/2015   MONOPCT 8 08/15/2015   MONOPCT 9.0 01/24/2015   EOSPCT 1 08/15/2015   EOSPCT 2.5 01/24/2015   BASOPCT 1 08/15/2015   BASOPCT 0.3 01/24/2015   CMP: Lab Results  Component Value Date   NA 140 08/17/2015   NA 142 01/24/2015   K 4.1 08/17/2015   K 4.2 01/24/2015   CL 112* 08/17/2015   CL 104 01/24/2015   CO2 22 08/17/2015   CO2 28 01/24/2015   BUN 16 08/17/2015   BUN 12 01/24/2015   CREATININE 1.18 08/17/2015   CREATININE 1.12 01/24/2015   CREATININE 1.10 05/31/2011   PROT 7.6 01/24/2015   ALBUMIN 3.7 01/24/2015   BILITOT 0.4 01/24/2015   ALKPHOS 83 01/24/2015   AST 13* 01/24/2015   ALT 19 01/24/2015  .  Micro Results Recent Results (from the past 240 hour(s))  MRSA PCR Screening     Status: None   Collection Time: 08/15/15  4:07 PM  Result Value Ref Range  Status   MRSA by PCR NEGATIVE NEGATIVE Final    Comment:        The GeneXpert MRSA Assay (FDA approved for NASAL specimens only), is one component of a comprehensive MRSA colonization surveillance program. It is not intended to diagnose MRSA infection nor to guide or monitor treatment for MRSA infections.            Follow-up Information    Follow up with Ida Rogue, MD In 4 days.   Specialty:  Cardiology   Why:  Friday, September 23rd at 230pm,  the office will call you with an appointment date closer, ccs   Contact information:   [place] [address] [phone]       Discharge Medications     Medication List    TAKE these medications        apixaban 5 MG Tabs tablet  Commonly known as:  ELIQUIS  Take 1 tablet (5 mg total) by mouth 2 (two) times daily.     diltiazem 120 MG 24 hr capsule  Commonly known as:  CARDIZEM CD  Take 1  capsule (120 mg total) by mouth daily.     diphenhydrAMINE 25 MG tablet  Commonly known as:  BENADRYL  Take 50 mg by mouth every 6 (six) hours as needed for sleep.     furosemide 40 MG tablet  Commonly known as:  LASIX  Take 1 tablet (40 mg total) by mouth 2 (two) times daily as needed.           Total Time in preparing paper work, data evaluation and todays exam - 35 minutes  Dustin Flock M.D on 08/19/2015 at 12:45 PM  [facility]   Office  [phone]

## 2015-08-28 ENCOUNTER — Ambulatory Visit: Payer: Medicare PPO | Admitting: Cardiovascular Disease

## 2015-08-29 ENCOUNTER — Encounter: Payer: Self-pay | Admitting: Cardiovascular Disease

## 2015-09-12 ENCOUNTER — Ambulatory Visit: Payer: Medicare PPO | Admitting: Physician Assistant

## 2015-09-12 ENCOUNTER — Encounter: Payer: Self-pay | Admitting: Physician Assistant

## 2015-09-15 ENCOUNTER — Ambulatory Visit (INDEPENDENT_AMBULATORY_CARE_PROVIDER_SITE_OTHER): Payer: Medicare HMO | Admitting: Cardiovascular Disease

## 2015-09-15 ENCOUNTER — Telehealth: Payer: Self-pay

## 2015-09-15 ENCOUNTER — Encounter: Payer: Self-pay | Admitting: Cardiovascular Disease

## 2015-09-15 VITALS — BP 128/62 | HR 60 | Ht 73.0 in | Wt 195.8 lb

## 2015-09-15 DIAGNOSIS — I341 Nonrheumatic mitral (valve) prolapse: Secondary | ICD-10-CM

## 2015-09-15 DIAGNOSIS — F17200 Nicotine dependence, unspecified, uncomplicated: Secondary | ICD-10-CM

## 2015-09-15 DIAGNOSIS — I340 Nonrheumatic mitral (valve) insufficiency: Secondary | ICD-10-CM | POA: Diagnosis not present

## 2015-09-15 DIAGNOSIS — Z720 Tobacco use: Secondary | ICD-10-CM

## 2015-09-15 DIAGNOSIS — I4891 Unspecified atrial fibrillation: Secondary | ICD-10-CM | POA: Diagnosis not present

## 2015-09-15 DIAGNOSIS — I2510 Atherosclerotic heart disease of native coronary artery without angina pectoris: Secondary | ICD-10-CM | POA: Diagnosis not present

## 2015-09-15 DIAGNOSIS — IMO0001 Reserved for inherently not codable concepts without codable children: Secondary | ICD-10-CM

## 2015-09-15 DIAGNOSIS — E785 Hyperlipidemia, unspecified: Secondary | ICD-10-CM

## 2015-09-15 MED ORDER — POTASSIUM CHLORIDE ER 20 MEQ PO TBCR: 20.0000 meq | EXTENDED_RELEASE_TABLET | Freq: Two times a day (BID) | ORAL | Status: DC | PRN

## 2015-09-15 MED ORDER — DILTIAZEM HCL ER COATED BEADS 120 MG PO CP24: 120.0000 mg | ORAL_CAPSULE | Freq: Every day | ORAL | Status: DC

## 2015-09-15 MED ORDER — DILTIAZEM HCL 30 MG PO TABS: 30.0000 mg | ORAL_TABLET | Freq: Three times a day (TID) | ORAL | Status: DC | PRN

## 2015-09-15 MED ORDER — APIXABAN 5 MG PO TABS
5.0000 mg | ORAL_TABLET | Freq: Two times a day (BID) | ORAL | Status: DC
Start: 2015-09-15 — End: 2015-10-29

## 2015-09-15 NOTE — Assessment & Plan Note (Signed)
We have encouraged him to continue to work on weaning his cigarettes and smoking cessation. He will continue to work on this and does not want any assistance with chantix.  

## 2015-09-15 NOTE — Telephone Encounter (Signed)
Clarified rx that was sent in today.

## 2015-09-15 NOTE — Assessment & Plan Note (Signed)
We'll discuss his cholesterol with him in follow-up. Ideally should be on a statin with goal LDL less than 70

## 2015-09-15 NOTE — Telephone Encounter (Signed)
Pharmacist called regarding Dilltiazem please call.

## 2015-09-15 NOTE — Progress Notes (Signed)
Patient ID: Michael Delacruz, male    DOB: 09/18/1941, 74 y.o.   MRN: [id number]  HPI Comments: Michael Delacruz is a 74 year old gentleman with a reported history of rheumatic fever as a child in his late teenage years, a murmur for several decades, long smoking history for 50 years with underlying COPD, with worsening shortness of breath when he was first seen in clinic, severe mitral valve regurgitation on echocardiogram, prolapse of posterior leaflet, moderate pulmonary hypertension on initial echocardiogram who presents for Routine followup of his severe MR. Last cardiac catheterization 2012 with results below  Recent hospitalization at [facility] for paroxysmal atrial fibrillation. After rate control, he had TEE cardioversion and was discharged on diltiazem, and coagulation. In follow up today, he is feeling better. Continues to have headaches, neuropathy in his feet. Does not sleep well in general, chronic issue. Fatigue during the daytime. Some issues with anxiety/stress. Rare palpitations since his discharge. Currently taking Lasix once, sometimes twice a day for shortness of breath  EKG on today's visit showing normal sinus rhythm with rate 60 bpm, nonspecific ST abnormality secondary to LVH  Other past medical history Previously had bradycardia on metoprolol was taking 12.5 mg in the morning  previousdramatic improvement in his weight and diet with drop of his cholesterol from 240 to 170. Cholesterol now back up to 260. He refused cholesterol pill  Repeat echocardiogram 2013 shows mildly dilated left ventricle at end systole, left ventricular size is less than 4 cm in diastole,   normal LV function estimated at >60%, moderate valve prolapse of the posterior leaflet with moderate to severe mitral valve regurgitation that is the centric and directed towards the septum, right ventricular systolic pressure estimated at 50 mm mercury  He had a cardiac catheterization  that showed severe  mitral valve regurgitation, 30% mid and 30% proximal LAD disease, 50% mid circumflex disease ejection fraction 50%.   Allergies  Allergen Reactions  . Macrodantin [Nitrofurantoin Macrocrystal] Rash  . Morphine And Related Rash    Outpatient Encounter Prescriptions as of 09/15/2015  Medication Sig  . apixaban (ELIQUIS) 5 MG TABS tablet Take 1 tablet (5 mg total) by mouth 2 (two) times daily.  [name] diltiazem (CARDIZEM CD) 120 MG 24 hr capsule Take 1 capsule (120 mg total) by mouth daily.  . furosemide (LASIX) 40 MG tablet Take 1 tablet (40 mg total) by mouth 2 (two) times daily as needed. (Patient taking differently: Take 40 mg by mouth 2 (two) times daily as needed. )  . [DISCONTINUED] apixaban (ELIQUIS) 5 MG TABS tablet Take 1 tablet (5 mg total) by mouth 2 (two) times daily.  . [DISCONTINUED] diltiazem (CARDIZEM CD) 120 MG 24 hr capsule Take 1 capsule (120 mg total) by mouth daily.  [name] diltiazem (CARDIZEM) 30 MG tablet Take 1 tablet (30 mg total) by mouth 3 (three) times daily as needed.  . potassium chloride 20 MEQ TBCR Take 20 mEq by mouth 2 (two) times daily as needed.  . [DISCONTINUED] diphenhydrAMINE (BENADRYL) 25 MG tablet Take 50 mg by mouth every 6 (six) hours as needed for sleep.   No facility-administered encounter medications on file as of 09/15/2015.    Past Medical History  Diagnosis Date  . COPD (chronic obstructive pulmonary disease)   . History of blood clots     eye   . Dilation of intestine 01/2015  . Asthma   . Severe mitral regurgitation mitral valve leak    a. echo 02/2015: EF 60-65%, G2DD, dilated aortic  root @ 44 mm, severe aortic prolapse involving posterior leaflet w/ mod to severe regurg directed eccentrically and anteriorly, LA dilated at 46 mm  . PAF (paroxysmal atrial fibrillation) 07/2015    a. new onset during admission to [facility] 07/2015; b. on Eliquis; c. s/p successful TEE/DCCV 07/2015  . History of rheumatic fever   . Tobacco abuse     a. ongoing tobacco  abuse, no thoughts on cessation     Past Surgical History  Procedure Laterality Date  . Ankle surgery    . Tee without cardioversion N/A 08/18/2015    Procedure: TRANSESOPHAGEAL ECHOCARDIOGRAM (TEE);  Surgeon: Wellington Hampshire, MD;  Location: [facility] ORS;  Service: Cardiovascular;  Laterality: N/A;  . Electrophysiologic study N/A 08/18/2015    Procedure: CARDIOVERSION;  Surgeon: Wellington Hampshire, MD;  Location: [facility] ORS;  Service: Cardiovascular;  Laterality: N/A;    Social History  reports that he has been smoking Cigarettes.  He has a 26 pack-year smoking history. He does not have any smokeless tobacco history on file. He reports that he drinks about 0.5 oz of alcohol per week. He reports that he does not use illicit drugs.  Family History Family history is unknown by patient.  Review of Systems  Constitutional: Negative.   Respiratory: Positive for cough and shortness of breath.   Cardiovascular: Negative.   Gastrointestinal: Negative.   Musculoskeletal: Negative.   Neurological: Negative.   Psychiatric/Behavioral: Negative.   All other systems reviewed and are negative.   BP 128/62 mmHg  Pulse 60  Ht 6\' 1"  (1.854 m)  Wt 195 lb 12 oz (88.792 kg)  BMI 25.83 kg/m2  Physical Exam  Constitutional: He is oriented to person, place, and time. He appears well-developed and well-nourished.  HENT:  Head: Normocephalic.  Nose: Nose normal.  Mouth/Throat: Oropharynx is clear and moist.  Eyes: Conjunctivae are normal. Pupils are equal, round, and reactive to light.  Neck: Normal range of motion. Neck supple. No JVD present.  Cardiovascular: Normal rate, regular rhythm, S1 normal, S2 normal and intact distal pulses.  Exam reveals no gallop and no friction rub.   Murmur heard.  Crescendo systolic murmur is present with a grade of 3/6  Pulmonary/Chest: Effort normal and breath sounds normal. No respiratory distress. He has no wheezes. He has no rales. He exhibits no tenderness.   Abdominal: Soft. Bowel sounds are normal. He exhibits no distension. There is no tenderness.  Musculoskeletal: Normal range of motion. He exhibits no edema or tenderness.  Lymphadenopathy:    He has no cervical adenopathy.  Neurological: He is alert and oriented to person, place, and time. Coordination normal.  Skin: Skin is warm and dry. No rash noted. No erythema.  Psychiatric: He has a normal mood and affect. His behavior is normal. Judgment and thought content normal.      Assessment and Plan   Nursing note and vitals reviewed.

## 2015-09-15 NOTE — Assessment & Plan Note (Addendum)
Severe MR with posterior leaflet prolapse After long discussion, recommended he talk with cardiothoracic surgery in [place]. Unclear. Would be a candidate for mitral valve repair through lateral approach. He has indicated today as well as in the past he prefers no mediastinal incision. Lastly would be a mitral clip option though unclear if he would be a candidate as he is not a poor surgical candidate for traditional repair

## 2015-09-15 NOTE — Patient Instructions (Addendum)
You are doing well.  We will set up an appt with Dr. Cyndia Bent in [facility] for mitral valve evaluation A referral has been sent to his office They will contact you with an appointment  We will check labs today: BMP, PSA  For palpitations, take an extra diltiazem 30 mg pill  Take potassium with your lasix  Please call us if you have new issues that need to be addressed before your next appt.  Your physician wants you to follow-up in: 6 months.  You will receive a reminder letter in the mail two months in advance. If you don't receive a letter, please call our office to schedule the follow-up appointment.  Mitral Valve Prolapse The mitral valve is located between the top and bottom parts of the heart on the left side. A mitral valve prolapse (MVP) is an abnormal bulging of 1 or both of the 2 mitral leaflets. The valve bulges into the top chamber (atrium) of the heart when the bottom chamber (ventricle) squeezes or contracts. MVP is more common in females. It is an inherited problem and is usually not found until adolescence. It is not harmful and rarely needs other treatment. PROBLEMS MAY INCLUDE:  Chest pain.  Palpitations.  Anxiety.  Panic attacks.  Stroke, rarely. HOME CARE INSTRUCTIONS   Taking antibiotics before a dental or other medical procedure is no longer routine. Consult with your caregiver.  Exercise as your caregiver instructs.  Discuss cardiac risk factors associated with MVP with your caregiver. SEEK IMMEDIATE MEDICAL CARE IF:   You develop frequent episodes of chest pain or an irregular heartbeat.  You faint or pass out.  You have severe chest pain or shortness of breath.  You develop palpitations with weakness or dizziness.  You have difficulty with vision or swallowing or weakness or numbness on one side of your body. MAKE SURE YOU:   Understand these instructions.  Will watch your condition.  Will get help right away if you are not doing well or get  worse. Document Released: 12/03/2000 Document Revised: 02/28/2012 Document Reviewed: 02/02/2008 [facility] Patient Information 2015 [place], [place]. This information is not intended to replace advice given to you by your health care provider. Make sure you discuss any questions you have with your health care provider.

## 2015-09-15 NOTE — Assessment & Plan Note (Signed)
Long history of smoking, previously not interested in statins. Cardiac catheterization in 2012. If he does proceed with mitral valve surgery, would potentially repeat cardiac catheterization prior to surgery

## 2015-09-15 NOTE — Assessment & Plan Note (Signed)
Recent episode of paroxysmal atrial fibrillation. Recommended he stay on the Cardizem. Previously did not tolerate beta blocker secondary to bradycardia. Diltiazem 30 mg pills provided if palpitations get worse to take on an as-needed basis. High risk of recurrent arrhythmia given his MR and severely dilated left atrium

## 2015-09-16 LAB — BASIC METABOLIC PANEL
BUN/Creatinine Ratio: 7 — ABNORMAL LOW (ref 10–22)
BUN: 8 mg/dL (ref 8–27)
CO2: 20 mmol/L (ref 18–29)
Calcium: 9.8 mg/dL (ref 8.6–10.2)
Chloride: 102 mmol/L (ref 97–108)
Creatinine, Ser: 1.18 mg/dL (ref 0.76–1.27)
GFR calc Af Amer: 71 mL/min/{1.73_m2} (ref >59)
GFR calc non Af Amer: 61 mL/min/{1.73_m2} (ref >59)
Glucose: 100 mg/dL — ABNORMAL HIGH (ref 65–99)
Potassium: 4.7 mmol/L (ref 3.5–5.2)
Sodium: 143 mmol/L (ref 134–144)

## 2015-09-16 LAB — PSA: Prostate Specific Ag, Serum: 3.6 ng/mL (ref 0.0–4.0)

## 2015-09-17 ENCOUNTER — Encounter: Payer: Medicare HMO | Admitting: Surgery

## 2015-09-19 ENCOUNTER — Institutional Professional Consult (permissible substitution) (INDEPENDENT_AMBULATORY_CARE_PROVIDER_SITE_OTHER): Payer: Medicare HMO | Admitting: Thoracic Surgery (Cardiothoracic Vascular Surgery)

## 2015-09-19 ENCOUNTER — Encounter: Payer: Self-pay | Admitting: Thoracic Surgery (Cardiothoracic Vascular Surgery)

## 2015-09-19 ENCOUNTER — Other Ambulatory Visit: Payer: Self-pay | Admitting: *Deleted

## 2015-09-19 VITALS — BP 132/77 | HR 64 | Resp 20 | Ht 73.0 in | Wt 195.0 lb

## 2015-09-19 DIAGNOSIS — I4891 Unspecified atrial fibrillation: Secondary | ICD-10-CM

## 2015-09-19 DIAGNOSIS — I340 Nonrheumatic mitral (valve) insufficiency: Secondary | ICD-10-CM | POA: Diagnosis not present

## 2015-09-19 DIAGNOSIS — I7409 Other arterial embolism and thrombosis of abdominal aorta: Secondary | ICD-10-CM

## 2015-09-19 DIAGNOSIS — I341 Nonrheumatic mitral (valve) prolapse: Secondary | ICD-10-CM | POA: Diagnosis not present

## 2015-09-19 DIAGNOSIS — I5022 Chronic systolic (congestive) heart failure: Secondary | ICD-10-CM | POA: Insufficient documentation

## 2015-09-19 DIAGNOSIS — I719 Aortic aneurysm of unspecified site, without rupture: Secondary | ICD-10-CM

## 2015-09-19 NOTE — Progress Notes (Signed)
Michael Delacruz       Michael Delacruz,Michael Delacruz             [phone]     CARDIOTHORACIC SURGERY CONSULTATION REPORT  Referring Provider is Michael Delacruz, Michael Delacruz, Michael Delacruz PCP is No PCP Per Patient  Chief Complaint  Patient presents with  . Mitral Regurgitation    Surgical eval, TEE 08/18/15, last Cardiac Cath 04/21/2011  . Mitral Valve Prolapse  . Atrial Fibrillation    HPI:  Patient is a 74 year old male with known history of mitral valve prolapse and severe mitral regurgitation, long-standing tobacco abuse with COPD, chronic dyspnea on exertion, and recently diagnosed persistent atrial fibrillation for which he underwent DC cardioversion this been referred for surgical consultation to discuss treatment options for management of mitral valve prolapse with mitral regurgitation and atrial fibrillation. The patient states that he was told he had rheumatic fever as a child and he has had a heart murmur for most of his adult life. He has been followed for the last several years by Michael Delacruz. Michael Delacruz with known history of mitral valve prolapse and severe mitral regurgitation. In 2012 he was referred to [facility] for possible elective mitral valve repair, but the patient declined recommendations to proceed with elective surgery at that time. Over the past year he has developed further progression in symptoms of exertional shortness of breath, palpitations, and atypical chest pain. He was hospitalized at [facility] on 08/15/2015 with new onset persistent atrial fibrillation. Follow-up transthoracic echocardiogram confirmed the presence of mitral valve prolapse with severe mitral regurgitation and preserved left ventricular systolic function. He was started on Eliquis for anticoagulation and underwent TEE guided DC cardioversion on 08/18/2015.  He was seen in follow-up by Michael Delacruz. Michael Delacruz on 09/15/2015 at which time he was maintaining sinus rhythm.  The patient was referred for surgical  consultation.  The patient is married and lives locally in [place] with his wife. He has been disabled since 1985 because of chronic pain in his lower back that dates back to an injury he sustained an on-the-job in the distant past.  The patient states that his back pain waxes and wanes in severity and limits his activities to some degree, but he remains entirely functionally independent. 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 He describes a long history of exertional shortness of breath that has progressed substantially over the past year. He states that his exercise tolerance and breathing waxes and wanes in severity, but at times he gets short of breath with very mild activity. He denies any history of resting shortness of breath, although his wife states that occasionally he gets up in the middle night because of dyspnea. He denies history of orthopnea or lower extremity edema. He describes intermittent tightness across his chest that also waxes and wanes in severity and is not necessarily related to physical activity. He has a chronic cough that is productive of whitish sputum.  He continues to smoke cigarettes  Past Medical History  Diagnosis Date  . COPD (chronic obstructive pulmonary disease)   . History of blood clots     eye   . Dilation of intestine 01/2015  . Asthma   . Severe mitral regurgitation   . Atrial fibrillation, persistent 07/2015  . History of rheumatic fever   . Tobacco abuse   . Chronic diastolic congestive heart failure     Past Surgical History  Procedure  Laterality Date  . Ankle surgery    . Tee without cardioversion N/A 08/18/2015    Procedure: TRANSESOPHAGEAL ECHOCARDIOGRAM (TEE);  Surgeon: Michael Hampshire, Michael Delacruz;  Location: [facility] ORS;  Service: Cardiovascular;  Laterality: N/A;  . Electrophysiologic study N/A 08/18/2015    Procedure: CARDIOVERSION;  Surgeon: Michael Hampshire, Michael Delacruz;  Location: [facility] ORS;  Service:  Cardiovascular;  Laterality: N/A;    Family History  Problem Relation Age of Onset  . Family history unknown: Yes    Social History   Social History  . Marital Status: Married    Spouse Name: N/A  . Number of Children: N/A  . Years of Education: N/A   Occupational History  . Not on file.   Social History Main Topics  . Smoking status: Current Every Day Smoker -- 0.50 packs/day for 52 years    Types: Cigarettes  . Smokeless tobacco: Not on file  . Alcohol Use: 0.5 oz/week    1 Standard drinks or equivalent per week  . Drug Use: No  . Sexual Activity: Not on file   Other Topics Concern  . Not on file   Social History Narrative    Current Outpatient Prescriptions  Medication Sig Dispense Refill  . apixaban (ELIQUIS) 5 MG TABS tablet Take 1 tablet (5 mg total) by mouth 2 (two) times daily. 60 tablet 11  . diltiazem (CARDIZEM CD) 120 MG 24 hr capsule Take 1 capsule (120 mg total) by mouth daily. 30 capsule 11  . diltiazem (CARDIZEM) 30 MG tablet Take 1 tablet (30 mg total) by mouth 3 (three) times daily as needed. 90 tablet 6  . furosemide (LASIX) 40 MG tablet Take 1 tablet (40 mg total) by mouth 2 (two) times daily as needed. (Patient taking differently: Take 40 mg by mouth 2 (two) times daily as needed. ) 180 tablet 3  . potassium chloride 20 MEQ TBCR Take 20 mEq by mouth 2 (two) times daily as needed. 60 tablet 6   No current facility-administered medications for this visit.    Allergies  Allergen Reactions  . Macrodantin [Nitrofurantoin Macrocrystal] Rash  . Morphine And Related Rash      Review of Systems:   General:  decreased appetite, decreased energy, no weight gain, no weight loss, no fever  Cardiac:  + chest pain with exertion, + chest pain at rest, + SOB with exertion, no resting SOB, no PND, no orthopnea, + palpitations, + arrhythmia, + atrial fibrillation, no LE edema, no dizzy spells, no syncope  Respiratory:  + shortness of breath, no home oxygen, +  productive cough, + dry cough, no bronchitis, no wheezing, no hemoptysis, no asthma, no pain with inspiration or cough, no sleep apnea, no CPAP at night  GI:   no difficulty swallowing, + reflux, no frequent heartburn, + hiatal hernia, + abdominal pain, + constipation, no diarrhea, no hematochezia, no hematemesis, no melena  GU:   no dysuria,  no frequency, recent urinary tract infection, no hematuria, no enlarged prostate, no kidney stones, no kidney disease  Vascular:  no pain suggestive of claudication, no pain in feet, no leg cramps, no varicose veins, no DVT, no non-healing foot ulcer  Neuro:   + ocular stroke, no TIA's, no seizures, + headaches, + temporary blindness one eye,  no slurred speech, no peripheral neuropathy, + chronic pain, no instability of gait, no memory/cognitive dysfunction  Musculoskeletal: + arthritis, no joint swelling, no myalgias, no difficulty walking, normal mobility   Skin:   no rash,  no itching, no skin infections, no pressure sores or ulcerations  Psych:   no anxiety, no depression, no nervousness, no unusual recent stress  Eyes:   no blurry vision, + floaters, no recent vision changes, + wears glasses for reading  ENT:   no hearing loss, no loose or painful teeth, full set dentures, last saw dentist many years ago  Hematologic:  + easy bruising, no abnormal bleeding, no clotting disorder, no frequent epistaxis  Endocrine:  no diabetes, does not check CBG's at home     Physical Exam:   BP 132/77 mmHg  Pulse 64  Resp 20  Ht 6\' 1"  (1.854 m)  Wt 195 lb (88.451 kg)  BMI 25.73 kg/m2  SpO2 98%  General:   Well-appearing  HEENT:  Unremarkable   Neck:   no JVD, no bruits, no adenopathy   Chest:   clear to auscultation, symmetrical breath sounds, no wheezes, no rhonchi   CV:   RRR, grade IV/VI holosystolic murmur   Abdomen:  soft, non-tender, no masses   Extremities:  warm, well-perfused, pulses palpable but diminished, no LE  edema  Rectal/GU  Deferred  Neuro:   Grossly non-focal and symmetrical throughout  Skin:   Clean and dry, no rashes, no breakdown   Diagnostic Tests:  Transthoracic Echocardiography  Patient:  Michael Delacruz, Michael Delacruz MR #:    [id number] Study Date: 08/16/2015 Gender:   M Age:    74 Height:   185.4 cm Weight:   81 kg BSA:    2.04 m^2 Pt. Status: Room:    [address]A  Elgie [place] [id number] ATTENDING  Michael Delacruz, Michael Delacruz, Michael REFERRING  Michael Delacruz SONOGRAPHER Arville Go Delacruz PERFORMING  [facility]  cc:  ------------------------------------------------------------------- LV EF: 60% -  65%  ------------------------------------------------------------------- Indications:   Mitral Valve Disorder.  ------------------------------------------------------------------- Study Conclusions  - Left ventricle: The cavity size was normal. Wall thickness was increased in a pattern of mild LVH. Systolic function was normal. The estimated ejection fraction was in the range of 60% to 65%. Wall motion was normal; there were no regional wall motion abnormalities. - Aortic root: The aortic root was mildly dilated. - Mitral valve: Moderately calcified annulus. Severe , involving the posterior leaflet. There was moderate to severe regurgitation directed anteriorly and toward the septum. Valve area by pressure half-time: 2.22 cm^2. - Left atrium: The atrium was severely dilated.  Transthoracic echocardiography. M-mode, complete 2D, spectral Doppler, and color Doppler. Birthdate: Patient birthdate: April 15, 1941. Age: Patient is 74 yr old. Sex: Gender: male. BMI: 23.6 kg/m^2. Patient status: Inpatient. Study date: Study date: 08/16/2015. Study time: 01:29  PM.  -------------------------------------------------------------------  ------------------------------------------------------------------- Left ventricle: The cavity size was normal. Wall thickness was increased in a pattern of mild LVH. Systolic function was normal. The estimated ejection fraction was in the range of 60% to 65%. Wall motion was normal; there were no regional wall motion abnormalities.  ------------------------------------------------------------------- Aortic valve:  Trileaflet; normal thickness leaflets. Mobility was not restricted. Doppler: Transvalvular velocity was within the normal range. There was no stenosis. There was no regurgitation.  ------------------------------------------------------------------- Aorta: Aortic root: The aortic root was mildly dilated.  ------------------------------------------------------------------- Mitral valve:  Moderately calcified annulus. Mobility was not restricted. Severe , involving the posterior leaflet. Doppler: Transvalvular velocity was within the normal range. There was no evidence for stenosis. There was moderate to severe regurgitation directed anteriorly and toward the septum.  Valve area by pressure half-time: 2.22 cm^2. Indexed valve area by pressure half-time: 1.09  cm^2/m^2.  Mean gradient (D): 4 mm Hg. Peak gradient (D): 8 mm Hg.  ------------------------------------------------------------------- Left atrium: The atrium was severely dilated.  ------------------------------------------------------------------- Right ventricle: The cavity size was normal. Wall thickness was normal. Systolic function was normal.  ------------------------------------------------------------------- Pulmonic valve:  Doppler: Transvalvular velocity was within the normal range. There was no evidence for stenosis.  ------------------------------------------------------------------- Tricuspid valve:   Structurally normal valve.  Doppler: Transvalvular velocity was within the normal range. There was no regurgitation.  ------------------------------------------------------------------- Pulmonary artery:  The main pulmonary artery was normal-sized.  ------------------------------------------------------------------- Right atrium: The atrium was normal in size.  ------------------------------------------------------------------- Pericardium: There was no pericardial effusion.  ------------------------------------------------------------------- Systemic veins: Inferior vena cava: The vessel was normal in size.  ------------------------------------------------------------------- Measurements  Left ventricle             Value     Reference LV ID, ED, PLAX chordal    (H)   56.2 mm    43 - 52 LV ID, ES, PLAX chordal        36.8 mm    23 - 38 LV fx shortening, PLAX chordal     35  %    >=29 LV PW thickness, ED          12.3 mm    --------- IVS/LV PW ratio, ED          0.9      <=1.3  Ventricular septum           Value     Reference IVS thickness, ED           11.1 mm    ---------  LVOT                  Value     Reference LVOT ID, S               24  mm    --------- LVOT area               4.52 cm^2   --------- LVOT peak velocity, S         108  cm/s   ---------  Aorta                 Value     Reference Aortic root ID, ED           40  mm    --------- Ascending aorta ID, A-P, S       41  mm    ---------  Left atrium              Value     Reference LA ID, A-P, ES             64  mm    --------- LA ID/bsa, A-P         (H)   3.13 cm/m^2  <=2.2 LA volume, S               101  ml    --------- LA volume/bsa, S            49.4 ml/m^2  --------- LA volume, ES, 1-p A4C         91.4 ml    --------- LA volume/bsa, ES, 1-p A4C       44.7 ml/m^2  --------- LA volume, ES, 1-p A2C         110  ml    --------- LA volume/bsa, ES, 1-p A2C  53.8 ml/m^2  ---------  Mitral valve              Value     Reference Mitral E-wave peak velocity      144  cm/s   --------- Mitral mean velocity, D        92.9 cm/s   --------- Mitral pressure half-time       99  ms    --------- Mitral mean gradient, D        4   mm Hg  --------- Mitral peak gradient, D        8   mm Hg  --------- Mitral valve area, PHT, DP       2.22 cm^2   --------- Mitral valve area/bsa, PHT, DP     1.09 cm^2/m^2 --------- Mitral annulus VTI, D         37.5 cm    ---------  Legend: (L) and (H) mark values outside specified reference range.  ------------------------------------------------------------------- Prepared and Electronically Authenticated by  Ezzard Standing, Michael Delacruz FACC 2016-08-27T16:29:38         *[facility]*           [address]            [place], [place] [address]              [phone]  ------------------------------------------------------------------- Transthoracic Echocardiography  (Report amended )  Patient:  Michael Delacruz, Michael Delacruz MR #:    [id number] Study Date: 08/16/2015 Gender:   M Age:    74 Height:   185.4 cm Weight:   81 kg BSA:    2.04 m^2 Pt. Status: Room:    [address]A  Elgie Congo [id number] ATTENDING  Michael Delacruz, Michael Delacruz, Michael REFERRING  Michael Delacruz SONOGRAPHER Arville Go Delacruz PERFORMING  [facility]  cc:  ------------------------------------------------------------------- LV EF: 60% -  65%  ------------------------------------------------------------------- Indications:   Mitral Valve Disorder.  ------------------------------------------------------------------- Study Conclusions  - Left ventricle: The cavity size was normal. Wall thickness was increased in a pattern of mild LVH. Systolic function was normal. The estimated ejection fraction was in the range of 60% to 65%. Wall motion was normal; there were no regional wall motion abnormalities. - Aortic root: The aortic root was mildly dilated. - Mitral valve: Moderately calcified annulus. Severe, holosystolicprolapse, involving the posterior leaflet. There was moderate to severe regurgitation directed anteriorly and toward the septum. Valve area by pressure half-time: 2.22 cm^2. - Left atrium: The atrium was severely dilated.  Transthoracic echocardiography. M-mode, complete 2D, spectral Doppler, and color Doppler. Birthdate: Patient birthdate: 1941-05-24. Age: Patient is 74 yr old. Sex: Gender: male. BMI: 23.6 kg/m^2. Patient status: Inpatient. Study date: Study date: 08/16/2015. Study time: 01:29 PM.  -------------------------------------------------------------------  ------------------------------------------------------------------- Left ventricle: The cavity size was normal. Wall thickness was increased in a pattern of mild LVH. Systolic function was normal. The estimated ejection fraction was in the range of 60% to 65%. Wall motion was normal; there were no regional wall motion abnormalities.  ------------------------------------------------------------------- Aortic valve:  Trileaflet; normal thickness leaflets. Mobility was not restricted. Doppler: Transvalvular velocity was within the normal range. There was no stenosis. There was no  regurgitation.  ------------------------------------------------------------------- Aorta: Aortic root: The aortic root was mildly dilated.  ------------------------------------------------------------------- Mitral valve:  Moderately calcified annulus. Mobility was not restricted. Severe, holosystolicprolapse, involving the posterior leaflet. Doppler: Transvalvular velocity was within the normal range. There was no evidence for stenosis. There was moderate to severe regurgitation directed  anteriorly and toward the septum. Valve area by pressure half-time: 2.22 cm^2. Indexed valve area by pressure half-time: 1.09 cm^2/m^2.  Mean gradient (D): 4 mm Hg. Peak gradient (D): 8 mm Hg.  ------------------------------------------------------------------- Left atrium: The atrium was severely dilated.  ------------------------------------------------------------------- Right ventricle: The cavity size was normal. Wall thickness was normal. Systolic function was normal.  ------------------------------------------------------------------- Pulmonic valve:  Doppler: Transvalvular velocity was within the normal range. There was no evidence for stenosis.  ------------------------------------------------------------------- Tricuspid valve:  Structurally normal valve.  Doppler: Transvalvular velocity was within the normal range. There was no regurgitation.  ------------------------------------------------------------------- Pulmonary artery:  The main pulmonary artery was normal-sized.  ------------------------------------------------------------------- Right atrium: The atrium was normal in size.  ------------------------------------------------------------------- Pericardium: There was no pericardial effusion.  ------------------------------------------------------------------- Systemic veins: Inferior vena cava: The vessel was normal in  size.  ------------------------------------------------------------------- Measurements  Left ventricle             Value     Reference LV ID, ED, PLAX chordal    (H)   56.2 mm    43 - 52 LV ID, ES, PLAX chordal        36.8 mm    23 - 38 LV fx shortening, PLAX chordal     35  %    >=29 LV PW thickness, ED          12.3 mm    --------- IVS/LV PW ratio, ED          0.9      <=1.3  Ventricular septum           Value     Reference IVS thickness, ED           11.1 mm    ---------  LVOT                  Value     Reference LVOT ID, S               24  mm    --------- LVOT area               4.52 cm^2   --------- LVOT peak velocity, S         108  cm/s   ---------  Aorta                 Value     Reference Aortic root ID, ED           40  mm    --------- Ascending aorta ID, A-P, S       41  mm    ---------  Left atrium              Value     Reference LA ID, A-P, ES             64  mm    --------- LA ID/bsa, A-P         (H)   3.13 cm/m^2  <=2.2 LA volume, S              101  ml    --------- LA volume/bsa, S            49.4 ml/m^2  --------- LA volume, ES, 1-p A4C         91.4 ml    --------- LA volume/bsa, ES, 1-p A4C       44.7 ml/m^2  --------- LA volume, ES, 1-p A2C  110  ml    --------- LA volume/bsa, ES, 1-p A2C       53.8 ml/m^2  ---------  Mitral valve              Value     Reference Mitral E-wave peak velocity      144  cm/s   --------- Mitral mean velocity, D        92.9 cm/s   --------- Mitral pressure half-time       99  ms    --------- Mitral  mean gradient, D        4   mm Hg  --------- Mitral peak gradient, D        8   mm Hg  --------- Mitral valve area, PHT, DP       2.22 cm^2   --------- Mitral valve area/bsa, PHT, DP     1.09 cm^2/m^2 --------- Mitral annulus VTI, D         37.5 cm    ---------  Legend: (L) and (H) mark values outside specified reference range.  ------------------------------------------------------------------- Willa Rough, Michael Delacruz [facility] 2016-08-27T16:30:24    Transesophageal Echocardiography with Cardioversion  Patient:  Michael Delacruz, Michael Delacruz MR #:    [id number] Study Date: 08/18/2015 Gender:   M Age:    68 Height: Weight: BSA: Pt. Status: Room:    Michael Delacruz [id number] Michael Delacruz, Michael Delacruz ATTENDING  Michael Delacruz SONOGRAPHER Michael Delacruz PERFORMING  [facility]  cc:  ------------------------------------------------------------------- LV EF: 55% -  60%  ------------------------------------------------------------------- History:  PMH:  Atrial fibrillation.  ------------------------------------------------------------------- Study Conclusions  - Left ventricle: The cavity size was normal. Wall thickness was normal. Systolic function was normal. The estimated ejection fraction was in the range of 55% to 60%. Wall motion was normal; there were no regional wall motion abnormalities. No evidence of thrombus. - Mitral valve: Severe prolapse, involving the posterior leaflet. There was severe regurgitation directed anteriorly. - Left atrium: The atrium was moderately to severely dilated. No evidence of thrombus in the atrial cavity or appendage. No evidence of thrombus in the appendage. - Right atrium: No evidence of thrombus in the atrial cavity or appendage. - Atrial septum: No defect or patent foramen ovale was  identified.  Impressions:  - Successful cardioversion. No cardiac source of emboli was indentified.  Transesophageal echocardiography with cardioversion. 2D and intravenous contrast injection. Birthdate: Patient birthdate: [birth year]. Age: Patient is 74 yr old. Sex: Gender: male. Study date: Study date: 08/18/2015. Study time: 12:00 PM.  -------------------------------------------------------------------  ------------------------------------------------------------------- Left ventricle: The cavity size was normal. Wall thickness was normal. Systolic function was normal. The estimated ejection fraction was in the range of 55% to 60%. Wall motion was normal; there were no regional wall motion abnormalities. No evidence of thrombus.  ------------------------------------------------------------------- Aortic valve:  Structurally normal valve.  Cusp separation was normal. No evidence of vegetation. Doppler: There was no regurgitation.  ------------------------------------------------------------------- Mitral valve:  Mild myxomatous degeneration.  Leaflet separation was normal. Severe prolapse, involving the posterior leaflet. No evidence of vegetation. Doppler:  There was no evidence for stenosis.  There was severe regurgitation directed anteriorly.  ------------------------------------------------------------------- Left atrium: The atrium was moderately to severely dilated. No evidence of thrombus in the atrial cavity or appendage. No evidence of thrombus in the appendage.  ------------------------------------------------------------------- Atrial septum: No defect or patent foramen ovale was identified.  ------------------------------------------------------------------- Right ventricle: The cavity size was normal. Wall thickness was normal. Systolic function was  normal.  ------------------------------------------------------------------- Pulmonic valve:  Structurally normal valve.  Cusp separation was normal. No evidence of vegetation.  ------------------------------------------------------------------- Tricuspid valve: Poorly visualized.  ------------------------------------------------------------------- Pulmonary artery:  The main pulmonary artery was normal-sized.  ------------------------------------------------------------------- Right atrium: The atrium was normal in size. No evidence of thrombus in the atrial cavity or appendage.  ------------------------------------------------------------------- Prepared and Electronically Authenticated by  Kathlyn Sacramento, Michael Delacruz 2016-08-29T17:03:27  Impression:  Patient has stage D severe symptomatic primary mitral regurgitation and recent onset persistent atrial fibrillation.  He describes a long history of symptoms of exertional shortness of breath that has progressed over the past year consistent with chronic diastolic congestive heart failure. Symptoms seemed to wax and wane in severity to some degree and are likely affected by the presence of underlying COPD.  The patient also has a long history of palpitations and atypical chest pain, and recently he developed new onset persistent atrial fibrillation.  He has been maintaining sinus rhythm since he underwent cardioversion 4 weeks ago. I have personally reviewed the patient's recent transthoracic and transesophageal echocardiograms. 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 There is severe left atrial enlargement. Left ventricular systolic function remains normal.  I agree that the patient needs elective mitral valve repair. He might benefit from concomitant Maze procedure. He has history of moderate nonobstructive coronary artery disease  on diagnostic cardiac catheterization performed in 2012. Repeat diagnostic cardiac catheterization will need to be performed prior to elective mitral valve repair. In the absence of significant coronary artery disease, the patient may be a good candidate for minimally invasive approach for surgery.   Plan:  The patient and his wife were counseled at length regarding the indications, risks and potential benefits of mitral valve repair.  The rationale for elective surgery has been explained, including a comparison between surgery and continued medical therapy with close follow-up.  The likelihood of successful and durable valve repair has been discussed with particular reference to the findings of their recent echocardiogram.  Based upon these findings and previous experience, I have quoted them a greater than 95 percent likelihood of successful valve repair.  Alternative surgical approaches have been discussed including a comparison between conventional sternotomy and minimally-invasive techniques.  The relative risks and benefits of each have been reviewed as they pertain to the patient's specific circumstances, and all of their questions have been addressed.  The potential benefits of concomitant maze procedure has been discussed. Expectations for the patient's postoperative convalescence have been reviewed. All of his questions have been answered. The patient is interested in proceeding with elective surgery in the near future. We will contact Michael Delacruz. Donivan Scull office so that arrangements for left and right heart catheterization can be made. We will obtain CT angiogram of the aorta and iliac vessels to evaluate the feasibility of peripheral cannulation for surgery. Finally, we will obtain formal pulmonary function tests. The patient will return in 2 weeks to review the results of these tests and make final plans for surgery.   I spent in excess of 90 minutes during the conduct of this office consultation and  >50% of this time involved direct face-to-face encounter with the patient for counseling and/or coordination of their care.    Valentina Gu. Roxy Manns, Michael Delacruz 09/19/2015 2:53 PM

## 2015-09-19 NOTE — Patient Instructions (Signed)
Continue all previous medications without any changes at this time  

## 2015-09-22 ENCOUNTER — Encounter: Payer: Self-pay | Admitting: *Deleted

## 2015-09-23 ENCOUNTER — Telehealth: Payer: Self-pay

## 2015-09-23 DIAGNOSIS — I059 Rheumatic mitral valve disease, unspecified: Secondary | ICD-10-CM

## 2015-09-23 DIAGNOSIS — Z01812 Encounter for preprocedural laboratory examination: Secondary | ICD-10-CM

## 2015-09-23 DIAGNOSIS — R079 Chest pain, unspecified: Secondary | ICD-10-CM

## 2015-09-23 DIAGNOSIS — Z01818 Encounter for other preprocedural examination: Secondary | ICD-10-CM

## 2015-09-23 NOTE — Telephone Encounter (Signed)
Per Dr. [name], pt needs to set up pre-surgical cath. Left message for pt to call back to see about setting this up for next Friday, 10/14.

## 2015-09-24 ENCOUNTER — Other Ambulatory Visit (INDEPENDENT_AMBULATORY_CARE_PROVIDER_SITE_OTHER): Payer: Medicare HMO

## 2015-09-24 DIAGNOSIS — Z01818 Encounter for other preprocedural examination: Secondary | ICD-10-CM

## 2015-09-24 DIAGNOSIS — R079 Chest pain, unspecified: Secondary | ICD-10-CM

## 2015-09-24 DIAGNOSIS — Z01812 Encounter for preprocedural laboratory examination: Secondary | ICD-10-CM

## 2015-09-24 DIAGNOSIS — I059 Rheumatic mitral valve disease, unspecified: Secondary | ICD-10-CM

## 2015-09-24 NOTE — Telephone Encounter (Signed)
Spoke w/ pt.  He is agreeable to coming in for cath 10/03/15. He is sched to come in today at 3:15 for pre-cath labs and go over instructions.

## 2015-09-25 LAB — CBC WITH DIFFERENTIAL/PLATELET
Basophils Absolute: 0 10*3/uL (ref 0.0–0.2)
Basos: 1 %
EOS (ABSOLUTE): 0.2 10*3/uL (ref 0.0–0.4)
Eos: 3 %
Hematocrit: 40.9 % (ref 37.5–51.0)
Hemoglobin: 14.6 g/dL (ref 12.6–17.7)
Immature Grans (Abs): 0 10*3/uL (ref 0.0–0.1)
Immature Granulocytes: 0 %
Lymphocytes Absolute: 1.7 10*3/uL (ref 0.7–3.1)
Lymphs: 32 %
MCH: 31.9 pg (ref 26.6–33.0)
MCHC: 35.7 g/dL (ref 31.5–35.7)
MCV: 89 fL (ref 79–97)
Monocytes Absolute: 0.5 10*3/uL (ref 0.1–0.9)
Monocytes: 9 %
Neutrophils Absolute: 3 10*3/uL (ref 1.4–7.0)
Neutrophils: 55 %
Platelets: 150 10*3/uL (ref 150–379)
RBC: 4.58 x10E6/uL (ref 4.14–5.80)
RDW: 12.6 % (ref 12.3–15.4)
WBC: 5.4 10*3/uL (ref 3.4–10.8)

## 2015-09-25 LAB — BASIC METABOLIC PANEL
BUN/Creatinine Ratio: 8 — ABNORMAL LOW (ref 10–22)
BUN: 8 mg/dL (ref 8–27)
CO2: 22 mmol/L (ref 18–29)
Calcium: 9.4 mg/dL (ref 8.6–10.2)
Chloride: 101 mmol/L (ref 97–108)
Creatinine, Ser: 1.04 mg/dL (ref 0.76–1.27)
GFR calc Af Amer: 83 mL/min/{1.73_m2} (ref >59)
GFR calc non Af Amer: 71 mL/min/{1.73_m2} (ref >59)
Glucose: 100 mg/dL — ABNORMAL HIGH (ref 65–99)
Potassium: 4.2 mmol/L (ref 3.5–5.2)
Sodium: 140 mmol/L (ref 134–144)

## 2015-09-25 LAB — PROTIME-INR
INR: 1 (ref 0.8–1.2)
Prothrombin Time: 10 s (ref 9.1–12.0)

## 2015-10-01 ENCOUNTER — Ambulatory Visit
Admission: RE | Admit: 2015-10-01 | Discharge: 2015-10-01 | Disposition: A | Discharge status: Home / Self Care | Payer: Medicare HMO | Source: Ambulatory Visit | Attending: Thoracic Surgery (Cardiothoracic Vascular Surgery) | Admitting: Thoracic Surgery (Cardiothoracic Vascular Surgery)

## 2015-10-01 ENCOUNTER — Telehealth: Payer: Self-pay

## 2015-10-01 ENCOUNTER — Ambulatory Visit (HOSPITAL_COMMUNITY)
Admission: RE | Admit: 2015-10-01 | Discharge: 2015-10-01 | Disposition: A | Discharge status: Home / Self Care | Payer: Medicare HMO | Source: Ambulatory Visit | Attending: Thoracic Surgery (Cardiothoracic Vascular Surgery) | Admitting: Thoracic Surgery (Cardiothoracic Vascular Surgery)

## 2015-10-01 DIAGNOSIS — I719 Aortic aneurysm of unspecified site, without rupture: Secondary | ICD-10-CM

## 2015-10-01 DIAGNOSIS — I7409 Other arterial embolism and thrombosis of abdominal aorta: Secondary | ICD-10-CM

## 2015-10-01 LAB — PULMONARY FUNCTION TEST
FEF 25-75 Pre: 2.1 L/sec
FEF2575-%Pred-Pre: 79 %
FEV1-%Pred-Pre: 87 %
FEV1-Pre: 3.1 L
FEV1FVC-%Pred-Pre: 98 %
FEV6-%Pred-Pre: 92 %
FEV6-Pre: 4.27 L
FEV6FVC-%Pred-Pre: 104 %
FVC-%Pred-Pre: 89 %
FVC-Pre: 4.32 L
Pre FEV1/FVC ratio: 72 %
Pre FEV6/FVC Ratio: 99 %

## 2015-10-01 MED ORDER — IOPAMIDOL (ISOVUE-370) INJECTION 76%
75.0000 mL | Freq: Once | INTRAVENOUS | Status: AC | PRN
  Administered 2015-10-01: 75 mL via INTRAVENOUS

## 2015-10-01 NOTE — Telephone Encounter (Signed)
Pt would like to know what a aortic aneurysm is and wants to know why he was not aware he had this . Please call and advise

## 2015-10-01 NOTE — Telephone Encounter (Signed)
Spoke w/ pt.  He states that he had a CT angio today that was ordered by Dr. Roxy Manns during consult on 9/30. He states that he was told that the test was ordered w/ the diagnosis of "history of abdominal aortic aneurysm", but pt has never been told of this diagnosis.  Advised pt to contact Dr. Guy Sandifer office, as they ordered the test, but he states that he trusts Dr. [name] and would like for him to review his chart. He asks that I call him back and let him know if he indeed had this dx before the test.

## 2015-10-01 NOTE — Telephone Encounter (Signed)
I can discuss the details on Friday with him as I believed he is scheduled for cardiac catheterization on the 14th CT scan has documented a mild-to-moderate ascending aorta aneurysm, Typically this is very difficult to see on other studies, CT can show it well It is not critical in size, typically can be monitored at this time, no surgery needed We'll explain in detail later this week

## 2015-10-02 ENCOUNTER — Other Ambulatory Visit: Payer: Self-pay | Admitting: Cardiovascular Disease

## 2015-10-02 DIAGNOSIS — I209 Angina pectoris, unspecified: Secondary | ICD-10-CM

## 2015-10-02 NOTE — Telephone Encounter (Signed)
Spoke w/ pt.  Advised him of Dr. Donivan Scull recommendation.  He is appreciative of the call and will keep appt for cath tomorrow.

## 2015-10-03 ENCOUNTER — Encounter: Payer: Self-pay | Admitting: *Deleted

## 2015-10-03 ENCOUNTER — Encounter
Admission: RE | Disposition: A | Discharge status: Home / Self Care | Payer: Self-pay | Source: Ambulatory Visit | Attending: Cardiovascular Disease

## 2015-10-03 ENCOUNTER — Ambulatory Visit
Admission: RE | Admit: 2015-10-03 | Discharge: 2015-10-03 | Disposition: A | Discharge status: Home / Self Care | Payer: Medicare HMO | Source: Ambulatory Visit | Attending: Cardiovascular Disease | Admitting: Cardiovascular Disease

## 2015-10-03 DIAGNOSIS — Z881 Allergy status to other antibiotic agents status: Secondary | ICD-10-CM | POA: Insufficient documentation

## 2015-10-03 DIAGNOSIS — Z7901 Long term (current) use of anticoagulants: Secondary | ICD-10-CM | POA: Insufficient documentation

## 2015-10-03 DIAGNOSIS — Z79899 Other long term (current) drug therapy: Secondary | ICD-10-CM | POA: Diagnosis not present

## 2015-10-03 DIAGNOSIS — I341 Nonrheumatic mitral (valve) prolapse: Secondary | ICD-10-CM | POA: Diagnosis not present

## 2015-10-03 DIAGNOSIS — Z885 Allergy status to narcotic agent status: Secondary | ICD-10-CM | POA: Insufficient documentation

## 2015-10-03 DIAGNOSIS — I480 Paroxysmal atrial fibrillation: Secondary | ICD-10-CM | POA: Diagnosis not present

## 2015-10-03 DIAGNOSIS — I340 Nonrheumatic mitral (valve) insufficiency: Secondary | ICD-10-CM | POA: Diagnosis not present

## 2015-10-03 DIAGNOSIS — I209 Angina pectoris, unspecified: Secondary | ICD-10-CM

## 2015-10-03 DIAGNOSIS — F17210 Nicotine dependence, cigarettes, uncomplicated: Secondary | ICD-10-CM | POA: Diagnosis not present

## 2015-10-03 DIAGNOSIS — R0602 Shortness of breath: Secondary | ICD-10-CM | POA: Diagnosis present

## 2015-10-03 DIAGNOSIS — I259 Chronic ischemic heart disease, unspecified: Secondary | ICD-10-CM | POA: Diagnosis not present

## 2015-10-03 DIAGNOSIS — E785 Hyperlipidemia, unspecified: Secondary | ICD-10-CM | POA: Diagnosis not present

## 2015-10-03 DIAGNOSIS — I272 Other secondary pulmonary hypertension: Secondary | ICD-10-CM | POA: Diagnosis not present

## 2015-10-03 DIAGNOSIS — I25119 Atherosclerotic heart disease of native coronary artery with unspecified angina pectoris: Secondary | ICD-10-CM | POA: Insufficient documentation

## 2015-10-03 DIAGNOSIS — I2510 Atherosclerotic heart disease of native coronary artery without angina pectoris: Secondary | ICD-10-CM | POA: Diagnosis not present

## 2015-10-03 DIAGNOSIS — J449 Chronic obstructive pulmonary disease, unspecified: Secondary | ICD-10-CM | POA: Diagnosis not present

## 2015-10-03 DIAGNOSIS — J45909 Unspecified asthma, uncomplicated: Secondary | ICD-10-CM | POA: Insufficient documentation

## 2015-10-03 DIAGNOSIS — I4891 Unspecified atrial fibrillation: Secondary | ICD-10-CM | POA: Diagnosis present

## 2015-10-03 DIAGNOSIS — R079 Chest pain, unspecified: Secondary | ICD-10-CM | POA: Diagnosis present

## 2015-10-03 HISTORY — PX: CARDIAC CATHETERIZATION: SHX172

## 2015-10-03 SURGERY — RIGHT AND LEFT HEART CATH
Anesthesia: Moderate Sedation

## 2015-10-03 SURGERY — RIGHT AND LEFT HEART CATH
Anesthesia: Moderate Sedation | Laterality: Bilateral

## 2015-10-03 MED ORDER — FENTANYL CITRATE (PF) 100 MCG/2ML IJ SOLN
INTRAMUSCULAR | Status: AC
  Filled 2015-10-03: qty 2

## 2015-10-03 MED ORDER — IOHEXOL 300 MG/ML  SOLN
INTRAMUSCULAR | Status: DC | PRN
  Administered 2015-10-03: 110 mL via INTRA_ARTERIAL

## 2015-10-03 MED ORDER — MIDAZOLAM HCL 2 MG/2ML IJ SOLN
INTRAMUSCULAR | Status: DC | PRN
  Administered 2015-10-03 (×2): 1 mg via INTRAVENOUS

## 2015-10-03 MED ORDER — FENTANYL CITRATE (PF) 100 MCG/2ML IJ SOLN
INTRAMUSCULAR | Status: DC | PRN
  Administered 2015-10-03 (×2): 50 ug via INTRAVENOUS

## 2015-10-03 MED ORDER — SODIUM CHLORIDE 0.9 % IV SOLN
INTRAVENOUS | Status: DC
  Administered 2015-10-03: 07:00:00 via INTRAVENOUS

## 2015-10-03 MED ORDER — HEPARIN (PORCINE) IN NACL 2-0.9 UNIT/ML-% IJ SOLN
INTRAMUSCULAR | Status: AC
  Filled 2015-10-03: qty 1000

## 2015-10-03 MED ORDER — MIDAZOLAM HCL 2 MG/2ML IJ SOLN
INTRAMUSCULAR | Status: AC
  Filled 2015-10-03: qty 2

## 2015-10-03 MED ORDER — ASPIRIN 81 MG PO CHEW: 81.0000 mg | CHEWABLE_TABLET | ORAL | Status: DC

## 2015-10-03 SURGICAL SUPPLY — 13 items
CATH INFINITI 5FR ANG PIGTAIL (CATHETERS) ×2 IMPLANT
CATH INFINITI 5FR JL4 (CATHETERS) ×2 IMPLANT
CATH INFINITI 5FR JL5 (CATHETERS) ×2 IMPLANT
CATH INFINITI JR4 5F (CATHETERS) ×2 IMPLANT
CATH SWANZ 7F THERMO (CATHETERS) ×2 IMPLANT
DEVICE CLOSURE MYNXGRIP 5F (Vascular Products) ×2 IMPLANT
KIT MANI 3VAL PERCEP (MISCELLANEOUS) ×2 IMPLANT
KIT RIGHT HEART (MISCELLANEOUS) ×2 IMPLANT
NEEDLE PERC 18GX7CM (NEEDLE) ×2 IMPLANT
PACK CARDIAC CATH (CUSTOM PROCEDURE TRAY) ×2 IMPLANT
SHEATH AVANTI 5FR X 11CM (SHEATH) ×2 IMPLANT
SHEATH PINNACLE 7F 10CM (SHEATH) ×2 IMPLANT
WIRE EMERALD 3MM-J .035X150CM (WIRE) ×2 IMPLANT

## 2015-10-03 NOTE — Discharge Instructions (Signed)
Groin Insertion Instructions-If you lose feeling or develop tingling or pain in your leg or foot after the procedure, please walk around first.  If the discomfort does not improve , contact your physician and proceed to the nearest emergency room.  Loss of feeling in your leg might mean that a blockage has formed in the artery and this can be appropriately treated.  Limit your activity for the next two days after your procedure.  Avoid stooping, bending, heavy lifting or exertion as this may put pressure on the insertion site.  Resume normal activities in 48 hours.  You may shower after 24 hours but avoid excessive warm water and do not scrub the site.  Remove clear dressing in 48 hours.  If you have had a closure device inserted, do not soak in a tub bath or a hot tub for at least one week.  No driving for 48 hours after discharge.  After the procedure, check the insertion site occasionally.  If any oozing occurs or there is apparent swelling, firm pressure over the site will prevent a bruise from forming.  You can not hurt anything by pressing directly on the site.  The pressure stops the bleeding by allowing a small clot to form.  If the bleeding continues after the pressure has been applied for more than 15 minutes, call 911 or go to the nearest emergency room.    The x-ray dye causes you to pass a considerate amount of urine.  For this reason, you will be asked to drink plenty of liquids after the procedure to prevent dehydration.  You may resume you regular diet.  Avoid caffeine products.    For pain at the site of your procedure, take non-aspirin medicines such as Tylenol.  Medications: A. Hold Metformin for 48 hours if applicable.  B. Continue taking all your present medications at home unless your doctor prescribes any changes.  APPOINTMENT ALREADY MADE WITH DR. OWEN THIS COMING Monday AS SCHEDULED.

## 2015-10-06 ENCOUNTER — Encounter: Payer: Self-pay | Admitting: Thoracic Surgery (Cardiothoracic Vascular Surgery)

## 2015-10-06 ENCOUNTER — Other Ambulatory Visit: Payer: Self-pay | Admitting: *Deleted

## 2015-10-06 ENCOUNTER — Ambulatory Visit (INDEPENDENT_AMBULATORY_CARE_PROVIDER_SITE_OTHER): Payer: Medicare HMO | Admitting: Thoracic Surgery (Cardiothoracic Vascular Surgery)

## 2015-10-06 VITALS — BP 130/82 | HR 64 | Resp 20 | Ht 73.0 in | Wt 202.0 lb

## 2015-10-06 DIAGNOSIS — I480 Paroxysmal atrial fibrillation: Secondary | ICD-10-CM | POA: Diagnosis not present

## 2015-10-06 DIAGNOSIS — I4891 Unspecified atrial fibrillation: Secondary | ICD-10-CM | POA: Diagnosis not present

## 2015-10-06 DIAGNOSIS — I340 Nonrheumatic mitral (valve) insufficiency: Secondary | ICD-10-CM | POA: Diagnosis not present

## 2015-10-06 DIAGNOSIS — I341 Nonrheumatic mitral (valve) prolapse: Secondary | ICD-10-CM

## 2015-10-06 MED ORDER — AMIODARONE HCL 200 MG PO TABS: 200.0000 mg | ORAL_TABLET | Freq: Two times a day (BID) | ORAL | Status: DC

## 2015-10-06 NOTE — Progress Notes (Signed)
[address]       [place] [address]             [phone]     CARDIOTHORACIC SURGERY OFFICE NOTE  Referring Provider is [name], Kathlene November, MD PCP is No PCP Per Patient   HPI:  Patient returns for follow-up Stage D severe symptomatic primary mitral regurgitation and atrial fibrillation. He was originally seen in consultation on 09/19/2015. Since then he underwent left and right heart catheterization by Dr. [name] on 10/03/2015. He was found to have mild nonobstructive coronary artery disease. Pulmonary artery pressures were mildly elevated.  He also underwent pulmonary function testing and CT angiography. He returns to our office today to review the results of these tests and discussed treatment options further. He reports no new problems or complaints for the last few weeks. Unfortunately, he has not been able to quit smoking. He remains very anxious.  He continues to experience significant exertional shortness of breath.  He also has experienced intermittent chest pains that seem to be exacerbated by stress.   Current Outpatient Prescriptions  Medication Sig Dispense Refill  . diltiazem (CARDIZEM CD) 120 MG 24 hr capsule Take 1 capsule (120 mg total) by mouth daily. 30 capsule 11  . diltiazem (CARDIZEM) 30 MG tablet Take 1 tablet (30 mg total) by mouth 3 (three) times daily as needed. 90 tablet 6  . furosemide (LASIX) 40 MG tablet Take 1 tablet (40 mg total) by mouth 2 (two) times daily as needed. (Patient taking differently: Take 40 mg by mouth 2 (two) times daily as needed. ) 180 tablet 3  . potassium chloride 20 MEQ TBCR Take 20 mEq by mouth 2 (two) times daily as needed. 60 tablet 6  . apixaban (ELIQUIS) 5 MG TABS tablet Take 1 tablet (5 mg total) by mouth 2 (two) times daily. (Patient not taking: Reported on 10/06/2015) 60 tablet 11   No current facility-administered medications for this visit.      Physical Exam:   BP 130/82 mmHg  Pulse 64  Resp 20   Ht 6\' 1"  (1.854 m)  Wt 202 lb (91.627 kg)  BMI 26.66 kg/m2  SpO2 98%  General:  Well-appearing  Chest:   Clear  CV:   Regular rate and rhythm with prominent holosystolic murmur  Incisions:  Right groin incision is tender and mildly swollen, no pulsatile mass  Abdomen:  Soft and nontender  Extremities:  Warm and well-perfused  Diagnostic Tests:  CARDIAC CATHETERIZATION  Procedures    Right and Left Heart Cath and Coronary Angiography    Conclusion     Prox Cx lesion, 40% stenosed.  Mid RCA lesion, 35% stenosed.  The left ventricular systolic function is normal.     Technique and Indications    Cardiac Catheterization Procedure Note  Name: Braelon Sprung MRN: [id number] DOB: 72-16-1942  Procedure: Left Heart Cath, Selective Coronary Angiography, LV angiography  Indication:  74 year old gentleman with prior cardiac catheterization showing nonobstructive coronary artery disease, history of severe mitral valve regurgitation/prolapse, now with angina pectoris, shortness of breath on exertion. Recently seen by Dr. Roxy Manns for consideration of mitral valve surgery  Procedural details: The right groin was prepped, draped, and anesthetized with 1% lidocaine. Using modified Seldinger technique, a 5 French sheath was introduced into the right femoral artery. 7 French sheath introduced into the right femoral vein for right heart pressures. Swan-Ganz catheter advanced to pulmonary artery, wedge position and pressures obtained. Standard Judkins catheters  were used to coronary angiography and left ventriculography. JL 5 catheter used to engage the left main . Catheter exchanges were performed over a guidewire. There were no immediate procedural complications. The patient was transferred to the post catheterization recovery area for further monitoring. Closure device placed  Procedural Findings: Right heart catheter pressures; please see numbers attached   Coronary  angiography:  Coronary dominance: Right  Left mainstem: Large vessel that bifurcates into the LAD and left circumflex. No significant disease noted  Left anterior descending (LAD): Large vessel that extends around the apical region, mild luminal irregularities.  Left circumflex (LCx): Moderate to large size vessel with mild proximal circumflex disease prior to the bifurcation. Does not appear to have any change compared to prior catheterization  Right coronary artery (RCA): Large dominant vessel with mild mid RCA disease.  Left ventriculography: Left ventricular systolic function is normal, LVEF is estimated at 55-65%, there is severe mitral regurgitation   Final Conclusions:  Nonobstructive coronary artery disease, normal ejection fraction, severe MR. Etiology of his chest pain does not appear to be secondary to underlying coronary anatomy. Severe MR likely contributing to shortness of breath. History of underlying anxiety  Recommendations: He has appointment with Dr. Roxy Manns for further discussion concerning his severe MR, prolapse   Ida Rogue 10/03/2015, 8:52 AM  Estimated blood loss <50 mL. There were no immediate complications during the procedure.    Coronary Findings    Dominance: Right   Left Circumflex   . Prox Cx lesion, 40% stenosed.     Right Coronary Artery   . Mid RCA lesion, 35% stenosed.       Right Heart Pressures Hemodynamic findings consistent with mitral valve regurgitation. Right atrial pressure 4/10/6 RV pressure 37/5/9 PA pressure 42/16/28 Pulmonary capillary wedge pressure mean of 17, A wave 15, V-wave 30 LV pressure 109/5, end pressure 14 AO pressure 123/64, mean 88  Cardiac output 9.7, cardiac index 4.5    Wall Motion                 Left Heart    Left Ventricle The left ventricle is enlarged. The left ventricular systolic function is normal. The left ventricular ejection fraction is greater tha 65% by visual estimate. There  are no wall motion abnormalities in the left ventricle.   Mitral Valve There is no mitral valve stenosis, severe (4+) mitral regurgitation and mitral valve prolapse present. The annulus is calcified.   Aortic Valve There is no aortic valve stenosis. No calcification found in the aortic valve. There is normal aortic valve motion.   Aorta The ascending aorta is dilated. The aorta is aneurysmal.    Coronary Diagrams    Diagnostic Diagram            Implants    Name ID Temporary Type Supply   DEVICE CLOSURE MYNXGRIP 37F - [id number] No Vascular Products DEVICE CLOSURE MYNXGRIP 37F    PACS Images    Show images for Cardiac catheterization     Link to Procedure Log    Procedure Log      Hemo Data    AO Systolic Cath Pressure AO Diastolic Cath Pressure AO Mean Cath Pressure LV Systolic Cath Pressure LV End Diastolic PA Systolic Cath Pressure PA Diastolic Cath Pressure PA Mean Cath Pressure RA Wedge A Wave RA Wedge V Wave RV Systolic Cath Pressure RV Diastolic Cath Pressure RV End Diastolic PCW A Wave PCW V Wave PCW Mean AO O2 Sat PA O2 Sat AO  O2 Sat Fick C.O. Fick C.I.   -- -- -- -- -- -- -- -- 10 mmHg 6 mmHg -- -- -- 15 mmHg 30 mmHg 17 mmHg -- -- -- 9.7 L/min 4.5 L/min/m2   -- -- -- -- -- 41 mmHg 17 mmHg 28 mmHg -- -- -- -- -- -- -- -- -- -- -- -- --   -- -- -- -- -- 42 mmHg 16 mmHg 28 mmHg -- -- -- -- -- -- -- -- -- -- -- -- --   -- -- -- -- -- -- -- -- -- -- 38 mmHg 4 mmHg 8 mmHg -- -- -- -- -- -- -- --   -- -- -- -- -- -- -- -- -- -- 37 mmHg 5 mmHg 9 mmHg -- -- -- -- -- -- -- --   112 63 mmHg 83 mmHg -- -- -- -- -- -- -- -- -- -- -- -- -- -- -- -- -- --   120 70 mmHg 87 mmHg -- -- -- -- -- -- -- -- -- -- -- -- -- -- -- -- -- --   -- -- -- 110 mmHg 8 mmHg -- -- -- -- -- -- -- -- -- -- -- -- -- -- -- --   -- -- -- 111 mmHg 8 mmHg -- -- -- -- -- -- -- -- -- -- -- -- -- -- -- --   -- -- -- 109 mmHg 14 mmHg -- -- -- -- -- -- -- -- -- -- -- -- -- -- -- --    123 64 mmHg 88 mmHg -- -- -- -- -- -- -- -- -- -- -- -- -- -- -- -- -- --   -- -- -- -- -- -- -- -- -- -- -- -- -- -- -- -- -- MV -- -- --   -- -- -- -- -- -- -- -- -- -- -- -- -- -- -- -- 94.6 % -- SA -- --     CT ANGIOGRAPHY CHEST, ABDOMEN AND PELVIS  TECHNIQUE: Multidetector CT imaging through the chest, abdomen and pelvis was performed using the standard protocol during bolus administration of intravenous contrast. Multiplanar reconstructed images and MIPs were obtained and reviewed to evaluate the vascular anatomy.  CONTRAST: 75 mL of Omnipaque 370 intravenously.  COMPARISON: CT scan of March 12, 2005.  FINDINGS: CTA CHEST FINDINGS  No pneumothorax or pleural effusion is noted. Mild emphysematous changes noted in both upper lobes. No acute pulmonary disease is noted. Atherosclerosis of thoracic aorta is noted without dissection. Great vessels are widely patent without significant stenosis. 4.1 cm ascending thoracic aortic aneurysm is noted. No significant mediastinal mass or adenopathy is noted. Coronary artery calcifications are noted suggesting coronary artery disease. Transverse aortic arch measures 3.3 cm in diameter. Distal descending thoracic aorta measures 3.2 cm in diameter. No significant osseous abnormality is noted in the chest.  Review of the MIP images confirms the above findings.  CTA ABDOMEN AND PELVIS FINDINGS  No gallstones are noted. Stable hepatic cysts are noted. Spleen and pancreas appear normal. Adrenal glands and kidneys are unremarkable. No hydronephrosis or renal obstruction is noted. Atherosclerosis of abdominal aorta is noted without aneurysm or dissection. Iliac arteries are widely patent without significant stenosis. Mesenteric and renal arteries are widely patent without significant stenosis. There is no evidence of bowel obstruction. The appendix appears normal. No abnormal fluid collection is noted. Urinary bladder appears  normal. No significant adenopathy is noted.  Review of the MIP images  confirms the above findings.  IMPRESSION: Atherosclerosis of thoracic and abdominal aorta is noted without dissection.  4.1 cm ascending thoracic aortic aneurysm is noted. Recommend annual imaging followup by CTA or MRA. This recommendation follows 2010 ACCF/AHA/AATS/ACR/ASA/SCA/SCAI/SIR/STS/SVM Guidelines for the Diagnosis and Management of Patients with Thoracic Aortic Disease. Circulation. 2010; 121: B147-W295.  Mild emphysematous changes are noted in the upper lobes of both lungs.  Coronary artery calcifications are noted suggesting coronary artery disease.   Electronically Signed  By: Marijo Conception, M.D.  On: 10/01/2015 12:01   Pulmonary Function Tests        FVC  4.32 L  (89% predicted)  FEV1  3.10 L  (87% predicted)  FEF25-75 2.10 L  (79% predicted)    Impression:  Patient has stage D severe symptomatic primary mitral regurgitation and recent onset persistent atrial fibrillation. He describes a long history of symptoms of exertional shortness of breath that has progressed over the past year consistent with chronic diastolic congestive heart failure. Symptoms seemed to wax and wane in severity to some degree and are likely affected by the presence of underlying COPD. The patient also has a long history of palpitations and atypical chest pain, and recently he developed new onset persistent atrial fibrillation. He has been maintaining sinus rhythm since he underwent cardioversion 4 weeks ago. I have personally reviewed the patient's recent transthoracic and transesophageal echocardiograms. 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 There is severe left atrial enlargement. Left ventricular systolic function remains normal. I agree that the patient needs elective mitral valve repair. He  might benefit from concomitant Maze procedure. Diagnostic cardiac catheterization is notable for the absence of significant flow limiting coronary artery disease. CT angiography demonstrates very mild aneurysmal enlargement of the ascending thoracic aorta with maximum transverse diameter reported 4.1 cm. I have personally reviewed the patient's recent cath and CT scan.  The patient appears to be a reasonably good candidate for minimally invasive approach for surgery.   Plan:  The patient and his wife were again counseled at length regarding the indications, risks and potential benefits of mitral valve repair.  The rationale for elective surgery has been explained, including a comparison between surgery and continued medical therapy with close follow-up.  The likelihood of successful and durable valve repair has been discussed with particular reference to the findings of their recent echocardiogram.  Based upon these findings and previous experience, I have quoted them a greater than 95 percent likelihood of successful valve repair.  In the unlikely event that their valve cannot be successfully repaired, we discussed the possibility of replacing the mitral valve using a mechanical prosthesis with the attendant need for long-term anticoagulation versus the alternative of replacing it using a bioprosthetic tissue valve with its potential for late structural valve deterioration and failure, depending upon the patient's longevity.   The patient understands and accepts all potential risks of surgery including but not limited to risk of death, stroke or other neurologic complication, myocardial infarction, congestive heart failure, respiratory failure, renal failure, bleeding requiring transfusion and/or reexploration, arrhythmia, infection or other wound complications, pneumonia, pleural and/or pericardial effusion, pulmonary embolus, aortic dissection or other major vascular complication, or delayed complications  related to valve repair or replacement including but not limited to structural valve deterioration and failure, thrombosis, embolization, endocarditis, or paravalvular leak.  Alternative surgical approaches have been discussed including a comparison between conventional sternotomy and minimally-invasive  techniques.  The relative risks and benefits of each have been reviewed as they pertain to the patient's specific circumstances, and all of their questions have been addressed.  Specific risks potentially related to the minimally-invasive approach were discussed at length, including but not limited to risk of conversion to full or partial sternotomy, aortic dissection or other major vascular complication, unilateral acute lung injury or pulmonary edema, phrenic nerve dysfunction or paralysis, rib fracture, chronic pain, lung hernia, or lymphocele. All of their questions have been answered.  We tentatively plan to proceed with surgery on Wednesday, 10/22/2015. The patient has been given a prescription for amiodarone to begin 7 days prior to surgery. He will remain off of Eliquis between now and the time of surgery. The patient will return for follow-up and preoperative testing on Monday, 10/20/2015.    I spent in excess of 30 minutes during the conduct of this office consultation and >50% of this time involved direct face-to-face encounter with the patient for counseling and/or coordination of their care.    Valentina Gu. Roxy Manns, MD 10/06/2015 12:28 PM

## 2015-10-06 NOTE — Patient Instructions (Signed)
Begin taking amiodarone 7 days before your surgery  Patient has been instructed not to take Eliquis  Patient should continue taking all other medications without change through the day before surgery.  Patient should have nothing to eat or drink after midnight the night before surgery.  On the morning of surgery patient should take only Cardizem CD with a sip of water.

## 2015-10-20 ENCOUNTER — Encounter (HOSPITAL_COMMUNITY)
Admission: RE | Admit: 2015-10-20 | Discharge: 2015-10-20 | Disposition: A | Discharge status: Home / Self Care | Payer: Medicare HMO | Source: Ambulatory Visit | Attending: Thoracic Surgery (Cardiothoracic Vascular Surgery) | Admitting: Thoracic Surgery (Cardiothoracic Vascular Surgery)

## 2015-10-20 ENCOUNTER — Ambulatory Visit (HOSPITAL_COMMUNITY)
Admission: RE | Admit: 2015-10-20 | Discharge: 2015-10-20 | Disposition: A | Discharge status: Home / Self Care | Payer: Medicare HMO | Source: Ambulatory Visit | Attending: Thoracic Surgery (Cardiothoracic Vascular Surgery) | Admitting: Thoracic Surgery (Cardiothoracic Vascular Surgery)

## 2015-10-20 ENCOUNTER — Other Ambulatory Visit (HOSPITAL_COMMUNITY): Payer: Medicare HMO

## 2015-10-20 ENCOUNTER — Encounter (HOSPITAL_COMMUNITY): Payer: Self-pay

## 2015-10-20 ENCOUNTER — Ambulatory Visit (HOSPITAL_BASED_OUTPATIENT_CLINIC_OR_DEPARTMENT_OTHER)
Admission: RE | Admit: 2015-10-20 | Discharge: 2015-10-20 | Disposition: A | Discharge status: Home / Self Care | Payer: Medicare HMO | Source: Ambulatory Visit | Attending: Thoracic Surgery (Cardiothoracic Vascular Surgery) | Admitting: Thoracic Surgery (Cardiothoracic Vascular Surgery)

## 2015-10-20 ENCOUNTER — Ambulatory Visit (INDEPENDENT_AMBULATORY_CARE_PROVIDER_SITE_OTHER): Payer: Medicare HMO | Admitting: Thoracic Surgery (Cardiothoracic Vascular Surgery)

## 2015-10-20 ENCOUNTER — Encounter (HOSPITAL_COMMUNITY): Payer: Self-pay | Admitting: Thoracic Surgery (Cardiothoracic Vascular Surgery)

## 2015-10-20 ENCOUNTER — Encounter: Payer: Self-pay | Admitting: Thoracic Surgery (Cardiothoracic Vascular Surgery)

## 2015-10-20 VITALS — BP 126/60 | HR 64 | Temp 97.7°F | Resp 20 | Ht 73.0 in | Wt 202.8 lb

## 2015-10-20 DIAGNOSIS — I4891 Unspecified atrial fibrillation: Secondary | ICD-10-CM

## 2015-10-20 DIAGNOSIS — I340 Nonrheumatic mitral (valve) insufficiency: Secondary | ICD-10-CM

## 2015-10-20 DIAGNOSIS — I341 Nonrheumatic mitral (valve) prolapse: Secondary | ICD-10-CM

## 2015-10-20 HISTORY — DX: Gastro-esophageal reflux disease without esophagitis: K21.9

## 2015-10-20 HISTORY — DX: Calculus of kidney: N20.0

## 2015-10-20 HISTORY — DX: Unspecified osteoarthritis, unspecified site: M19.90

## 2015-10-20 HISTORY — DX: Personal history of other diseases of the digestive system: Z87.19

## 2015-10-20 HISTORY — DX: Fibromyalgia: M79.7

## 2015-10-20 HISTORY — DX: Cerebral infarction, unspecified: I63.9

## 2015-10-20 HISTORY — DX: Essential (primary) hypertension: I10

## 2015-10-20 LAB — URINALYSIS, ROUTINE W REFLEX MICROSCOPIC
Glucose, UA: NEGATIVE mg/dL
Hgb urine dipstick: NEGATIVE
Ketones, ur: 15 mg/dL — AB
Nitrite: NEGATIVE
Protein, ur: NEGATIVE mg/dL
Specific Gravity, Urine: 1.028 (ref 1.005–1.030)
Urobilinogen, UA: 1 mg/dL (ref 0.0–1.0)
pH: 5.5 (ref 5.0–8.0)

## 2015-10-20 LAB — COMPREHENSIVE METABOLIC PANEL
ALT: 18 U/L (ref 17–63)
AST: 21 U/L (ref 15–41)
Albumin: 3.6 g/dL (ref 3.5–5.0)
Alkaline Phosphatase: 72 U/L (ref 38–126)
Anion gap: 10 (ref 5–15)
BUN: 10 mg/dL (ref 6–20)
CO2: 23 mmol/L (ref 22–32)
Calcium: 9.4 mg/dL (ref 8.9–10.3)
Chloride: 108 mmol/L (ref 101–111)
Creatinine, Ser: 1.15 mg/dL (ref 0.61–1.24)
GFR calc Af Amer: >60 mL/min (ref >60)
GFR calc non Af Amer: >60 mL/min (ref >60)
Glucose, Bld: 122 mg/dL — ABNORMAL HIGH (ref 65–99)
Potassium: 3.7 mmol/L (ref 3.5–5.1)
Sodium: 141 mmol/L (ref 135–145)
Total Bilirubin: 0.9 mg/dL (ref 0.3–1.2)
Total Protein: 6.4 g/dL — ABNORMAL LOW (ref 6.5–8.1)

## 2015-10-20 LAB — CBC
HCT: 41.3 % (ref 39.0–52.0)
Hemoglobin: 14.3 g/dL (ref 13.0–17.0)
MCH: 32 pg (ref 26.0–34.0)
MCHC: 34.6 g/dL (ref 30.0–36.0)
MCV: 92.4 fL (ref 78.0–100.0)
Platelets: 144 10*3/uL — ABNORMAL LOW (ref 150–400)
RBC: 4.47 MIL/uL (ref 4.22–5.81)
RDW: 13.5 % (ref 11.5–15.5)
WBC: 5.8 10*3/uL (ref 4.0–10.5)

## 2015-10-20 LAB — BLOOD GAS, ARTERIAL
Acid-base deficit: 0.4 mmol/L (ref 0.0–2.0)
Bicarbonate: 23 mEq/L (ref 20.0–24.0)
Drawn by: 421801
FIO2: 0.21
O2 Saturation: 98.5 %
Patient temperature: 98.6
TCO2: 24 mmol/L (ref 0–100)
pCO2 arterial: 33.3 mmHg — ABNORMAL LOW (ref 35.0–45.0)
pH, Arterial: 7.453 — ABNORMAL HIGH (ref 7.350–7.450)
pO2, Arterial: 103 mmHg — ABNORMAL HIGH (ref 80.0–100.0)

## 2015-10-20 LAB — URINE MICROSCOPIC-ADD ON

## 2015-10-20 LAB — SURGICAL PCR SCREEN
MRSA, PCR: NEGATIVE
Staphylococcus aureus: NEGATIVE

## 2015-10-20 LAB — PROTIME-INR
INR: 1.09 (ref 0.00–1.49)
Prothrombin Time: 14.3 seconds (ref 11.6–15.2)

## 2015-10-20 LAB — APTT: aPTT: 39 seconds — ABNORMAL HIGH (ref 24–37)

## 2015-10-20 LAB — TYPE AND SCREEN
ABO/RH(D): O POS
Antibody Screen: NEGATIVE

## 2015-10-20 LAB — ABO/RH: ABO/RH(D): O POS

## 2015-10-20 NOTE — Progress Notes (Signed)
Denies having a PCP Cardiologist is Dr. Esmond Plants Echo noted in epic from 08-16-15 Card cath noted in epic from 10-03-15 States he last had Eliquis around Aug 25th. Sleep study done many years ago, does not wear cpap now, and his wife reports he does snore at times, but not so loud to hear him behind closed doors.

## 2015-10-20 NOTE — Progress Notes (Signed)
Call placed to [facility] at Dr Ricard Dillon office and informed of pt c/o chest pain yesterday, none today. Also informed her of ekg report done today,and that pt has been having some flutter like feeling in his chest which is new.

## 2015-10-20 NOTE — Patient Instructions (Signed)
   Patient should continue taking all medications without change through the day before surgery.  Patient should have nothing to eat or drink after midnight the night before surgery.

## 2015-10-20 NOTE — H&P (Signed)
[address]       [place], [address]             [phone]          CARDIOTHORACIC SURGERY HISTORY AND PHYSICAL EXAM  Referring Provider is [name], Kathlene November, MD PCP is No PCP Per Patient  Chief Complaint  Patient presents with  . Mitral Regurgitation    Surgical eval, TEE 08/18/15, last Cardiac Cath 04/21/2011  . Mitral Valve Prolapse  . Atrial Fibrillation    HPI:  Patient is a 74 year old male with known history of mitral valve prolapse and severe mitral regurgitation, long-standing tobacco abuse with COPD, chronic dyspnea on exertion, and recently diagnosed persistent atrial fibrillation for which he underwent DC cardioversion this been referred for surgical consultation to discuss treatment options for management of mitral valve prolapse with mitral regurgitation and atrial fibrillation. The patient states that he was told he had rheumatic fever as a child and he has had a heart murmur for most of his adult life. He has been followed for the last several years by Dr. [name] with known history of mitral valve prolapse and severe mitral regurgitation. In 2012 he was referred to [facility] for possible elective mitral valve repair, but the patient declined recommendations to proceed with elective surgery at that time. Over the past year he has developed further progression in symptoms of exertional shortness of breath, palpitations, and atypical chest pain. He was hospitalized at [facility] on 08/15/2015 with new onset persistent atrial fibrillation. Follow-up transthoracic echocardiogram confirmed the presence of mitral valve prolapse with severe mitral regurgitation and preserved left ventricular systolic function. He was started on Eliquis for anticoagulation and underwent TEE guided DC cardioversion on 08/18/2015. He was seen in follow-up by Dr. [name] on 09/15/2015 at which time he was maintaining sinus rhythm. The patient was  referred for surgical consultation.  The patient is married and lives locally in [place] with his wife. He has been disabled since 1985 because of chronic pain in his lower back that dates back to an injury he sustained an on-the-job in the distant past. The patient states that his back pain waxes and wanes in severity and limits his activities to some degree, but he remains entirely functionally independent. 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 He describes a long history of exertional shortness of breath that has progressed substantially over the past year. He states that his exercise tolerance and breathing waxes and wanes in severity, but at times he gets short of breath with very mild activity. He denies any history of resting shortness of breath, although his wife states that occasionally he gets up in the middle night because of dyspnea. He denies history of orthopnea or lower extremity edema. He describes intermittent tightness across his chest that also waxes and wanes in severity and is not necessarily related to physical activity. He has a chronic cough that is productive of whitish sputum. He continues to smoke cigarettes   Patient returns for follow-up of stage D severe symptomatically primary mitral regurgitation and atrial fibrillation. He was originally seen in consultation on 09/19/2015 and more recently on 10/06/2015. He returns to the office today with tentative plans to proceed with surgery on Wednesday, 10/22/2015. He reports no new problems or complaints over the last few weeks. Unfortunately he has not been able to quit smoking. He reports a persistent cough that is  occasionally productive of small amount of yellow sputum. He denies any fevers or chills. He states that he has had some mild abdominal discomfort associated with amiodarone therapy but he has been able to tolerate it without any difficulty.             Past  Medical History  Diagnosis Date  . COPD (chronic obstructive pulmonary disease) ([place])   . History of blood clots     eye   . Dilation of intestine 01/2015  . Asthma   . Severe mitral regurgitation   . Atrial fibrillation, persistent ([place]) 07/2015  . History of rheumatic fever   . Tobacco abuse   . Chronic diastolic congestive heart failure ([place])   . Anginal pain ([place])   . Hypertension   . Dysrhythmia   . Heart murmur   . Stroke ([place])   . Kidney stone   . History of hiatal hernia   . GERD (gastroesophageal reflux disease)   . Headache   . Arthritis   . Fibromyalgia     Past Surgical History  Procedure Laterality Date  . Ankle surgery    . Tee without cardioversion N/A 08/18/2015    Procedure: TRANSESOPHAGEAL ECHOCARDIOGRAM (TEE);  Surgeon: Wellington Hampshire, MD;  Location: [facility] ORS;  Service: Cardiovascular;  Laterality: N/A;  . Electrophysiologic study N/A 08/18/2015    Procedure: CARDIOVERSION;  Surgeon: Wellington Hampshire, MD;  Location: [facility] ORS;  Service: Cardiovascular;  Laterality: N/A;  . Cardiac catheterization N/A 10/03/2015    Procedure: Right and Left Heart Cath and Coronary Angiography;  Surgeon: [name], MD;  Location: [facility];  Service: Cardiovascular;  Laterality: N/A;  . Colonoscopy    . Sinus exploration      Family History  Problem Relation Age of Onset  . Family history unknown: Yes    Social History Social History  Substance Use Topics  . Smoking status: Current Every Day Smoker -- 0.50 packs/day for 52 years    Types: Cigarettes  . Smokeless tobacco: None  . Alcohol Use: 0.5 oz/week    1 Standard drinks or equivalent per week     Comment: rare    Prior to Admission medications   Medication Sig Start Date End Date Taking? Authorizing Provider  amiodarone (PACERONE) 200 MG tablet Take 1 tablet (200 mg total) by mouth 2 (two) times daily with a meal. 10/06/15  Yes Rexene Alberts, MD  diltiazem (CARDIZEM CD) 120 MG 24 hr  capsule Take 1 capsule (120 mg total) by mouth daily. 09/15/15  Yes [name], MD  diltiazem (CARDIZEM) 30 MG tablet Take 1 tablet (30 mg total) by mouth 3 (three) times daily as needed. Patient taking differently: Take 30 mg by mouth 3 (three) times daily as needed (pain).  09/15/15  Yes [name], MD  furosemide (LASIX) 40 MG tablet Take 1 tablet (40 mg total) by mouth 2 (two) times daily as needed. Patient taking differently: Take 40 mg by mouth every other day.  02/05/15  Yes [name], MD  potassium chloride 20 MEQ TBCR Take 20 mEq by mouth 2 (two) times daily as needed. Patient taking differently: Take 20 mEq by mouth 3 (three) times a week.  09/15/15  Yes [name], MD  apixaban (ELIQUIS) 5 MG TABS tablet Take 1 tablet (5 mg total) by mouth 2 (two) times daily. Patient not taking: Reported on 10/06/2015 09/15/15   [name], MD    Allergies  Allergen Reactions  . Macrodantin [Nitrofurantoin Macrocrystal] Rash  . Morphine And Related Rash      Review of Systems:  General:decreased appetite, decreased energy, no weight gain, no weight loss, no fever Cardiac:+ chest pain with exertion, + chest pain at rest, + SOB with exertion, no resting SOB, no PND, no orthopnea, + palpitations, + arrhythmia, + atrial fibrillation, no LE edema, no dizzy spells, no syncope Respiratory:+ shortness of breath, no home oxygen, + productive cough, + dry cough, no bronchitis, no wheezing, no hemoptysis, no asthma, no pain with inspiration or cough, no sleep apnea, no CPAP at night GI:no difficulty swallowing, + reflux, no frequent heartburn, + hiatal hernia, + abdominal pain, + constipation, no diarrhea, no hematochezia, no hematemesis, no melena GU:no dysuria, no frequency, recent urinary tract  infection, no hematuria, no enlarged prostate, no kidney stones, no kidney disease Vascular:no pain suggestive of claudication, no pain in feet, no leg cramps, no varicose veins, no DVT, no non-healing foot ulcer Neuro:+ ocular stroke, no TIA's, no seizures, + headaches, + temporary blindness one eye, no slurred speech, no peripheral neuropathy, + chronic pain, no instability of gait, no memory/cognitive dysfunction Musculoskeletal:+ arthritis, no joint swelling, no myalgias, no difficulty walking, normal mobility  Skin:no rash, no itching, no skin infections, no pressure sores or ulcerations Psych:no anxiety, no depression, no nervousness, no unusual recent stress Eyes:no blurry vision, + floaters, no recent vision changes, + wears glasses for reading ENT:no hearing loss, no loose or painful teeth, full set dentures, last saw dentist many years ago Hematologic:+ easy bruising, no abnormal bleeding, no clotting disorder, no frequent epistaxis Endocrine:no diabetes, does not check CBG's at home   Physical Exam:  BP 132/77 mmHg  Pulse 64  Resp 20  Ht 6\' 1"  (1.854 m)  Wt 195 lb (88.451 kg)  BMI 25.73 kg/m2  SpO2 98% General: Well-appearing HEENT:Unremarkable  Neck:no JVD, no bruits, no adenopathy  Chest:clear to auscultation, symmetrical breath sounds, no wheezes, no rhonchi  CV:RRR, grade IV/VI holosystolic murmur  Abdomen:soft,  non-tender, no masses  Extremities:warm, well-perfused, pulses palpable but diminished, no LE edema Rectal/GUDeferred Neuro:Grossly non-focal and symmetrical throughout Skin:Clean and dry, no rashes, no breakdown   Diagnostic Tests:  Transthoracic Echocardiography  Patient:  Trevaun, Rendleman MR #:    [id number] Study Date: 08/16/2015 Gender:   M Age:    74 Height:   185.4 cm Weight:   81 kg BSA:    2.04 m^2 Pt. Status: Room:    240A  Elgie Congo [id number] ATTENDING  Posey Pronto, Shreyang Laverda Page, Shreyang REFERRING  Dustin Flock SONOGRAPHER Arville Go RDCS PERFORMING  [facility]  cc:  ------------------------------------------------------------------- LV EF: 60% -  65%  ------------------------------------------------------------------- Indications:   Mitral Valve Disorder.  ------------------------------------------------------------------- Study Conclusions  - Left ventricle: The cavity size was normal. Wall thickness was increased in a pattern of mild LVH. Systolic function was normal. The estimated ejection fraction was in the range of 60% to 65%. Wall motion was normal; there were no regional wall motion abnormalities. - Aortic root: The aortic root was mildly dilated. - Mitral valve: Moderately calcified annulus. Severe , involving the posterior leaflet. There was moderate to severe regurgitation directed anteriorly and toward the septum. Valve area by pressure half-time: 2.22 cm^2. - Left atrium: The atrium was severely dilated.  Transthoracic echocardiography. M-mode, complete 2D, spectral Doppler, and color Doppler. Birthdate: Patient birthdate: 02-05-1941. Age: Patient is 74 yr old. Sex: Gender: male. BMI: 23.6 kg/m^2. Patient  status: Inpatient. Study date: Study date: 08/16/2015. Study time: 01:29 PM.  -------------------------------------------------------------------  ------------------------------------------------------------------- Left ventricle: The cavity size was normal. Wall thickness was increased in a pattern of mild LVH. Systolic function was normal. The estimated ejection fraction was in the range of 60% to 65%. Wall motion was normal; there were no regional wall motion abnormalities.  ------------------------------------------------------------------- Aortic valve:  Trileaflet; normal thickness leaflets. Mobility was not restricted. Doppler: Transvalvular velocity was within the normal range. There was no stenosis. There was no regurgitation.  ------------------------------------------------------------------- Aorta: Aortic root: The aortic root was mildly dilated.  ------------------------------------------------------------------- Mitral valve:  Moderately calcified annulus. Mobility was not restricted. Severe , involving the posterior leaflet. Doppler: Transvalvular velocity was within the normal range. There was no evidence for stenosis. There was moderate to severe regurgitation directed anteriorly and toward the septum.  Valve area by pressure half-time: 2.22 cm^2. Indexed valve area by pressure half-time: 1.09 cm^2/m^2.  Mean gradient (D): 4 mm Hg. Peak gradient (D): 8 mm Hg.  ------------------------------------------------------------------- Left atrium: The atrium was severely dilated.  ------------------------------------------------------------------- Right ventricle: The cavity size was normal. Wall thickness was normal. Systolic function was normal.  ------------------------------------------------------------------- Pulmonic valve:  Doppler: Transvalvular velocity was within the normal range. There was no evidence for  stenosis.  ------------------------------------------------------------------- Tricuspid valve:  Structurally normal valve.  Doppler: Transvalvular velocity was within the normal range. There was no regurgitation.  ------------------------------------------------------------------- Pulmonary artery:  The main pulmonary artery was normal-sized.  ------------------------------------------------------------------- Right atrium: The atrium was normal in size.  ------------------------------------------------------------------- Pericardium: There was no pericardial effusion.  ------------------------------------------------------------------- Systemic veins: Inferior vena cava: The vessel was normal in size.  ------------------------------------------------------------------- Measurements  Left ventricle             Value     Reference LV ID, ED, PLAX chordal    (H)   56.2 mm    43 - 52 LV ID, ES, PLAX chordal        36.8 mm    23 - 38 LV fx shortening, PLAX chordal     35  %    >=29 LV PW thickness, ED          12.3 mm    --------- IVS/LV PW ratio, ED          0.9      <=1.3  Ventricular septum           Value     Reference IVS thickness, ED           11.1 mm    ---------  LVOT                  Value     Reference LVOT ID, S               24  mm    --------- LVOT area               4.52 cm^2   --------- LVOT peak velocity, S         108  cm/s   ---------  Aorta                 Value     Reference Aortic root ID, ED           40  mm    --------- Ascending aorta ID, A-P, S       41  mm    ---------  Left atrium  Value     Reference LA ID, A-P, ES             64  mm    --------- LA  ID/bsa, A-P         (H)   3.13 cm/m^2  <=2.2 LA volume, S              101  ml    --------- LA volume/bsa, S            49.4 ml/m^2  --------- LA volume, ES, 1-p A4C         91.4 ml    --------- LA volume/bsa, ES, 1-p A4C       44.7 ml/m^2  --------- LA volume, ES, 1-p A2C         110  ml    --------- LA volume/bsa, ES, 1-p A2C       53.8 ml/m^2  ---------  Mitral valve              Value     Reference Mitral E-wave peak velocity      144  cm/s   --------- Mitral mean velocity, D        92.9 cm/s   --------- Mitral pressure half-time       99  ms    --------- Mitral mean gradient, D        4   mm Hg  --------- Mitral peak gradient, D        8   mm Hg  --------- Mitral valve area, PHT, DP       2.22 cm^2   --------- Mitral valve area/bsa, PHT, DP     1.09 cm^2/m^2 --------- Mitral annulus VTI, D         37.5 cm    ---------  Legend: (L) and (H) mark values outside specified reference range.  ------------------------------------------------------------------- Prepared and Electronically Authenticated by  Ezzard Standing, MD FACC 2016-08-27T16:29:38         *[facility]*           [address]            [place], [place] [address]              [phone]  ------------------------------------------------------------------- Transthoracic Echocardiography  (Report amended )  Patient:  Zebedee, Segundo MR #:    [id number] Study Date: 08/16/2015 Gender:   M Age:    74 Height:   185.4 cm Weight:   81 kg BSA:    2.04 m^2 Pt. Status: Room:    240A  Elgie Congo [id number] ATTENDING  Posey Pronto, Shreyang Laverda Page, Shreyang REFERRING  Dustin Flock SONOGRAPHER Arville Go RDCS PERFORMING  [facility]  cc:  ------------------------------------------------------------------- LV EF: 60% -  65%  ------------------------------------------------------------------- Indications:   Mitral Valve Disorder.  ------------------------------------------------------------------- Study Conclusions  - Left ventricle: The cavity size was normal. Wall thickness was increased in a pattern of mild LVH. Systolic function was normal. The estimated ejection fraction was in the range of 60% to 65%. Wall motion was normal; there were no regional wall motion abnormalities. - Aortic root: The aortic root was mildly dilated. - Mitral valve: Moderately calcified annulus. Severe, holosystolicprolapse, involving the posterior leaflet. There was moderate to severe regurgitation directed anteriorly and toward the septum. Valve area by pressure half-time: 2.22 cm^2. - Left atrium: The atrium was severely dilated.  Transthoracic echocardiography. M-mode, complete 2D, spectral Doppler, and color Doppler. Birthdate: Patient  birthdate: 1941/11/25. Age: Patient is 74 yr old. Sex: Gender: male. BMI: 23.6 kg/m^2. Patient status: Inpatient. Study date: Study date: 08/16/2015. Study time: 01:29 PM.  -------------------------------------------------------------------  ------------------------------------------------------------------- Left ventricle: The cavity size was normal. Wall thickness was increased in a pattern of mild LVH. Systolic function was normal. The estimated ejection fraction was in the range of 60% to 65%. Wall motion was normal; there were no regional wall motion abnormalities.  ------------------------------------------------------------------- Aortic valve:  Trileaflet; normal thickness leaflets. Mobility was not restricted. Doppler: Transvalvular velocity was within the normal range. There  was no stenosis. There was no regurgitation.  ------------------------------------------------------------------- Aorta: Aortic root: The aortic root was mildly dilated.  ------------------------------------------------------------------- Mitral valve:  Moderately calcified annulus. Mobility was not restricted. Severe, holosystolicprolapse, involving the posterior leaflet. Doppler: Transvalvular velocity was within the normal range. There was no evidence for stenosis. There was moderate to severe regurgitation directed anteriorly and toward the septum. Valve area by pressure half-time: 2.22 cm^2. Indexed valve area by pressure half-time: 1.09 cm^2/m^2.  Mean gradient (D): 4 mm Hg. Peak gradient (D): 8 mm Hg.  ------------------------------------------------------------------- Left atrium: The atrium was severely dilated.  ------------------------------------------------------------------- Right ventricle: The cavity size was normal. Wall thickness was normal. Systolic function was normal.  ------------------------------------------------------------------- Pulmonic valve:  Doppler: Transvalvular velocity was within the normal range. There was no evidence for stenosis.  ------------------------------------------------------------------- Tricuspid valve:  Structurally normal valve.  Doppler: Transvalvular velocity was within the normal range. There was no regurgitation.  ------------------------------------------------------------------- Pulmonary artery:  The main pulmonary artery was normal-sized.  ------------------------------------------------------------------- Right atrium: The atrium was normal in size.  ------------------------------------------------------------------- Pericardium: There was no pericardial effusion.  ------------------------------------------------------------------- Systemic veins: Inferior vena cava: The vessel was normal in  size.  ------------------------------------------------------------------- Measurements  Left ventricle             Value     Reference LV ID, ED, PLAX chordal    (H)   56.2 mm    43 - 52 LV ID, ES, PLAX chordal        36.8 mm    23 - 38 LV fx shortening, PLAX chordal     35  %    >=29 LV PW thickness, ED          12.3 mm    --------- IVS/LV PW ratio, ED          0.9      <=1.3  Ventricular septum           Value     Reference IVS thickness, ED           11.1 mm    ---------  LVOT                  Value     Reference LVOT ID, S               24  mm    --------- LVOT area               4.52 cm^2   --------- LVOT peak velocity, S         108  cm/s   ---------  Aorta                 Value     Reference Aortic root ID, ED           40  mm    --------- Ascending aorta ID, A-P, S       41  mm    ---------  Left atrium              Value     Reference LA ID, A-P, ES             64  mm    --------- LA ID/bsa, A-P         (H)   3.13 cm/m^2  <=2.2 LA volume, S              101  ml    --------- LA volume/bsa, S            49.4 ml/m^2  --------- LA volume, ES, 1-p A4C         91.4 ml    --------- LA volume/bsa, ES, 1-p A4C       44.7 ml/m^2  --------- LA volume, ES, 1-p A2C         110  ml    --------- LA volume/bsa, ES, 1-p A2C       53.8 ml/m^2  ---------  Mitral valve              Value     Reference Mitral E-wave peak velocity      144  cm/s   --------- Mitral mean velocity, D        92.9 cm/s   --------- Mitral pressure half-time       99  ms    --------- Mitral  mean gradient, D        4   mm Hg  --------- Mitral peak gradient, D        8   mm Hg  --------- Mitral valve area, PHT, DP       2.22 cm^2   --------- Mitral valve area/bsa, PHT, DP     1.09 cm^2/m^2 --------- Mitral annulus VTI, D         37.5 cm    ---------  Legend: (L) and (H) mark values outside specified reference range.  ------------------------------------------------------------------- Willa Rough, MD [facility] 2016-08-27T16:30:24    Transesophageal Echocardiography with Cardioversion  Patient:  Archibald, Marchetta MR #:    [id number] Study Date: 08/18/2015 Gender:   M Age:    67 Height: Weight: BSA: Pt. Status: Room:    Ashley Mariner [id number] Delano Metz, Areta Haber ATTENDING  Dustin Flock SONOGRAPHER Sherrie Sport RDCS PERFORMING  [facility]  cc:  ------------------------------------------------------------------- LV EF: 55% -  60%  ------------------------------------------------------------------- History:  PMH:  Atrial fibrillation.  ------------------------------------------------------------------- Study Conclusions  - Left ventricle: The cavity size was normal. Wall thickness was normal. Systolic function was normal. The estimated ejection fraction was in the range of 55% to 60%. Wall motion was normal; there were no regional wall motion abnormalities. No evidence of thrombus. - Mitral valve: Severe prolapse, involving the posterior leaflet. There was severe regurgitation directed anteriorly. - Left atrium: The atrium was moderately to severely dilated. No evidence of thrombus in the atrial cavity or appendage. No evidence of thrombus in the appendage. - Right atrium: No evidence of thrombus in the atrial cavity or appendage. - Atrial septum: No defect or patent foramen ovale was  identified.  Impressions:  - Successful cardioversion. No cardiac source of emboli was indentified.  Transesophageal echocardiography with cardioversion. 2D and intravenous contrast injection. Birthdate: Patient birthdate: 1941-03-18. Age: Patient is 74 yr old. Sex: Gender: male. Study date: Study date: 08/18/2015. Study time: 12:00 PM.  -------------------------------------------------------------------  ------------------------------------------------------------------- Left ventricle: The cavity size was normal. Wall  thickness was normal. Systolic function was normal. The estimated ejection fraction was in the range of 55% to 60%. Wall motion was normal; there were no regional wall motion abnormalities. No evidence of thrombus.  ------------------------------------------------------------------- Aortic valve:  Structurally normal valve.  Cusp separation was normal. No evidence of vegetation. Doppler: There was no regurgitation.  ------------------------------------------------------------------- Mitral valve:  Mild myxomatous degeneration.  Leaflet separation was normal. Severe prolapse, involving the posterior leaflet. No evidence of vegetation. Doppler:  There was no evidence for stenosis.  There was severe regurgitation directed anteriorly.  ------------------------------------------------------------------- Left atrium: The atrium was moderately to severely dilated. No evidence of thrombus in the atrial cavity or appendage. No evidence of thrombus in the appendage.  ------------------------------------------------------------------- Atrial septum: No defect or patent foramen ovale was identified.  ------------------------------------------------------------------- Right ventricle: The cavity size was normal. Wall thickness was normal. Systolic function was  normal.  ------------------------------------------------------------------- Pulmonic valve:  Structurally normal valve.  Cusp separation was normal. No evidence of vegetation.  ------------------------------------------------------------------- Tricuspid valve: Poorly visualized.  ------------------------------------------------------------------- Pulmonary artery:  The main pulmonary artery was normal-sized.  ------------------------------------------------------------------- Right atrium: The atrium was normal in size. No evidence of thrombus in the atrial cavity or appendage.  ------------------------------------------------------------------- Prepared and Electronically Authenticated by  Kathlyn Sacramento, MD 2016-08-29T17:03:27    CARDIAC CATHETERIZATION  Procedures    Right and Left Heart Cath and Coronary Angiography    Conclusion     Prox Cx lesion, 40% stenosed.  Mid RCA lesion, 35% stenosed.  The left ventricular systolic function is normal.     Technique and Indications    Cardiac Catheterization Procedure Note  Name: Deral Schellenberg MRN: [id number] DOB: Jul 07, 1941  Procedure: Left Heart Cath, Selective Coronary Angiography, LV angiography  Indication:  74 year old gentleman with prior cardiac catheterization showing nonobstructive coronary artery disease, history of severe mitral valve regurgitation/prolapse, now with angina pectoris, shortness of breath on exertion. Recently seen by Dr. Roxy Manns for consideration of mitral valve surgery  Procedural details: The right groin was prepped, draped, and anesthetized with 1% lidocaine. Using modified Seldinger technique, a 5 French sheath was introduced into the right femoral artery. 7 French sheath introduced into the right femoral vein for right heart pressures. Swan-Ganz catheter advanced to pulmonary artery, wedge position and pressures obtained. Standard Judkins catheters were  used to coronary angiography and left ventriculography. JL 5 catheter used to engage the left main . Catheter exchanges were performed over a guidewire. There were no immediate procedural complications. The patient was transferred to the post catheterization recovery area for further monitoring. Closure device placed  Procedural Findings: Right heart catheter pressures; please see numbers attached   Coronary angiography:  Coronary dominance: Right  Left mainstem: Large vessel that bifurcates into the LAD and left circumflex. No significant disease noted  Left anterior descending (LAD): Large vessel that extends around the apical region, mild luminal irregularities.  Left circumflex (LCx): Moderate to large size vessel with mild proximal circumflex disease prior to the bifurcation. Does not appear to have any change compared to prior catheterization  Right coronary artery (RCA): Large dominant vessel with mild mid RCA disease.  Left ventriculography: Left ventricular systolic function is normal, LVEF is estimated at 55-65%, there is severe mitral regurgitation   Final Conclusions:  Nonobstructive coronary artery disease, normal ejection fraction, severe MR. Etiology of his chest pain does not appear to be secondary to underlying coronary anatomy. Severe MR likely contributing to shortness of breath. History of underlying anxiety  Recommendations: He has appointment with  Dr. Roxy Manns for further discussion concerning his severe MR, prolapse   Ida Rogue 10/03/2015, 8:52 AM  Estimated blood loss <50 mL. There were no immediate complications during the procedure.    Coronary Findings    Dominance: Right   Left Circumflex   . Prox Cx lesion, 40% stenosed.     Right Coronary Artery   . Mid RCA lesion, 35% stenosed.       Right Heart Pressures Hemodynamic findings consistent with mitral valve regurgitation. Right atrial pressure 4/10/6 RV pressure  37/5/9 PA pressure 42/16/28 Pulmonary capillary wedge pressure mean of 17, A wave 15, V-wave 30 LV pressure 109/5, end pressure 14 AO pressure 123/64, mean 88  Cardiac output 9.7, cardiac index 4.5    Wall Motion                 Left Heart    Left Ventricle The left ventricle is enlarged. The left ventricular systolic function is normal. The left ventricular ejection fraction is greater tha 65% by visual estimate. There are no wall motion abnormalities in the left ventricle.   Mitral Valve There is no mitral valve stenosis, severe (4+) mitral regurgitation and mitral valve prolapse present. The annulus is calcified.   Aortic Valve There is no aortic valve stenosis. No calcification found in the aortic valve. There is normal aortic valve motion.   Aorta The ascending aorta is dilated. The aorta is aneurysmal.    Coronary Diagrams    Diagnostic Diagram            Implants    Name ID Temporary Type Supply   DEVICE CLOSURE MYNXGRIP 20F - [id number] No Vascular Products DEVICE CLOSURE MYNXGRIP 20F    PACS Images    Show images for Cardiac catheterization     Link to Procedure Log    Procedure Log      Hemo Data    AO Systolic Cath Pressure AO Diastolic Cath Pressure AO Mean Cath Pressure LV Systolic Cath Pressure LV End Diastolic PA Systolic Cath Pressure PA Diastolic Cath Pressure PA Mean Cath Pressure RA Wedge A Wave RA Wedge V Wave RV Systolic Cath Pressure RV Diastolic Cath Pressure RV End Diastolic PCW A Wave PCW V Wave PCW Mean AO O2 Sat PA O2 Sat AO O2 Sat Fick C.O. Fick C.I.   -- -- -- -- -- -- -- -- 10 mmHg 6 mmHg -- -- -- 15 mmHg 30 mmHg 17 mmHg -- -- -- 9.7 L/min 4.5 L/min/m2   -- -- -- -- -- 41 mmHg 17 mmHg 28 mmHg -- -- -- -- -- -- -- -- -- -- -- -- --   -- -- -- -- -- 42 mmHg 16 mmHg 28 mmHg -- -- -- --  -- -- -- -- -- -- -- -- --   -- -- -- -- -- -- -- -- -- -- 38 mmHg 4 mmHg 8 mmHg -- -- -- -- -- -- -- --   -- -- -- -- -- -- -- -- -- -- 37 mmHg 5 mmHg 9 mmHg -- -- -- -- -- -- -- --   112 63 mmHg 83 mmHg -- -- -- -- -- -- -- -- -- -- -- -- -- -- -- -- -- --   120 70 mmHg 87 mmHg -- -- -- -- -- -- -- -- -- -- -- -- -- -- -- -- -- --   -- -- -- 110 mmHg 8 mmHg -- -- -- -- -- -- -- -- -- -- -- -- -- -- -- --   -- -- --  111 mmHg 8 mmHg -- -- -- -- -- -- -- -- -- -- -- -- -- -- -- --   -- -- -- 109 mmHg 14 mmHg -- -- -- -- -- -- -- -- -- -- -- -- -- -- -- --   123 64 mmHg 88 mmHg -- -- -- -- -- -- -- -- -- -- -- -- -- -- -- -- -- --   -- -- -- -- -- -- -- -- -- -- -- -- -- -- -- -- -- MV -- -- --   -- -- -- -- -- -- -- -- -- -- -- -- -- -- -- -- 94.6 % -- SA -- --     CT ANGIOGRAPHY CHEST, ABDOMEN AND PELVIS  TECHNIQUE: Multidetector CT imaging through the chest, abdomen and pelvis was performed using the standard protocol during bolus administration of intravenous contrast. Multiplanar reconstructed images and MIPs were obtained and reviewed to evaluate the vascular anatomy.  CONTRAST: 75 mL of Omnipaque 370 intravenously.  COMPARISON: CT scan of March 12, 2005.  FINDINGS: CTA CHEST FINDINGS  No pneumothorax or pleural effusion is noted. Mild emphysematous changes noted in both upper lobes. No acute pulmonary disease is noted. Atherosclerosis of thoracic aorta is noted without dissection. Great vessels are widely patent without significant stenosis. 4.1 cm ascending thoracic aortic aneurysm is noted. No significant mediastinal mass or adenopathy is noted. Coronary artery calcifications are noted suggesting coronary artery disease. Transverse  aortic arch measures 3.3 cm in diameter. Distal descending thoracic aorta measures 3.2 cm in diameter. No significant osseous abnormality is noted in the chest.  Review of the MIP images confirms the above findings.  CTA ABDOMEN AND PELVIS FINDINGS  No gallstones are noted. Stable hepatic cysts are noted. Spleen and pancreas appear normal. Adrenal glands and kidneys are unremarkable. No hydronephrosis or renal obstruction is noted. Atherosclerosis of abdominal aorta is noted without aneurysm or dissection. Iliac arteries are widely patent without significant stenosis. Mesenteric and renal arteries are widely patent without significant stenosis. There is no evidence of bowel obstruction. The appendix appears normal. No abnormal fluid collection is noted. Urinary bladder appears normal. No significant adenopathy is noted.  Review of the MIP images confirms the above findings.  IMPRESSION: Atherosclerosis of thoracic and abdominal aorta is noted without dissection.  4.1 cm ascending thoracic aortic aneurysm is noted. Recommend annual imaging followup by CTA or MRA. This recommendation follows 2010 ACCF/AHA/AATS/ACR/ASA/SCA/SCAI/SIR/STS/SVM Guidelines for the Diagnosis and Management of Patients with Thoracic Aortic Disease. Circulation. 2010; 121: [id number].  Mild emphysematous changes are noted in the upper lobes of both lungs.  Coronary artery calcifications are noted suggesting coronary artery disease.   Electronically Signed  By: Marijo Conception, M.D.  On: 10/01/2015 12:01   Pulmonary Function Tests  FVC4.32 L (89% predicted) FEV13.10 L (87% predicted) FEF25-752.10 L (79% predicted)          Impression:  Patient has stage D severe symptomatic primary mitral regurgitation and recent onset persistent atrial  fibrillation. He describes a long history of symptoms of exertional shortness of breath that has progressed over the past year consistent with chronic diastolic congestive heart failure. Symptoms seemed to wax and wane in severity to some degree and are likely affected by the presence of underlying COPD. The patient also has a long history of palpitations and atypical chest pain, and recently he developed new onset persistent atrial fibrillation. He has been maintaining sinus rhythm since he underwent cardioversion 6 weeks ago. I have personally  reviewed the patient's recent transthoracic and transesophageal echocardiograms. 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 There is severe left atrial enlargement. Left ventricular systolic function remains normal. I agree that the patient needs elective mitral valve repair. He might benefit from concomitant Maze procedure. Diagnostic cardiac catheterization is notable for the absence of significant flow limiting coronary artery disease. CT angiography demonstrates very mild aneurysmal enlargement of the ascending thoracic aorta with maximum transverse diameter reported 4.1 cm. I have personally reviewed the patient's recent cath and CT scan. The patient appears to be a reasonably good candidate for minimally invasive approach for surgery.  Plan:  The patient and his wife were again counseled at length regarding the indications, risks and potential benefits of mitral valve repair. The rationale for elective surgery has been explained, including a comparison between surgery and continued medical therapy with close follow-up. The likelihood of successful and durable valve repair has been discussed with particular reference to the findings of their recent echocardiogram. Based upon these findings and previous experience, I have quoted them a greater than 95  percent likelihood of successful valve repair. In the unlikely event that their valve cannot be successfully repaired, we discussed the possibility of replacing the mitral valve using a mechanical prosthesis with the attendant need for long-term anticoagulation versus the alternative of replacing it using a bioprosthetic tissue valve with its potential for late structural valve deterioration and failure, depending upon the patient's longevity. The patient understands and accepts all potential risks of surgery including but not limited to risk of death, stroke or other neurologic complication, myocardial infarction, congestive heart failure, respiratory failure, renal failure, bleeding requiring transfusion and/or reexploration, arrhythmia, infection or other wound complications, pneumonia, pleural and/or pericardial effusion, pulmonary embolus, aortic dissection or other major vascular complication, or delayed complications related to valve repair or replacement including but not limited to structural valve deterioration and failure, thrombosis, embolization, endocarditis, or paravalvular leak. Alternative surgical approaches have been discussed including a comparison between conventional sternotomy and minimally-invasive techniques. The relative risks and benefits of each have been reviewed as they pertain to the patient's specific circumstances, and all of their questions have been addressed. Specific risks potentially related to the minimally-invasive approach were discussed at length, including but not limited to risk of conversion to full or partial sternotomy, aortic dissection or other major vascular complication, unilateral acute lung injury or pulmonary edema, phrenic nerve dysfunction or paralysis, rib fracture, chronic pain, lung hernia, or lymphocele. All of their questions have been answered. We plan to proceed with surgery on Wednesday, 10/22/2015.    I spent in excess of 15 minutes during  the conduct of this office consultation and >50% of this time involved direct face-to-face encounter with the patient for counseling and/or coordination of their care.    Valentina Gu. Roxy Manns, MD 10/20/2015 3:13 PM

## 2015-10-20 NOTE — Progress Notes (Signed)
[address]       [place] [address]             [phone]     CARDIOTHORACIC SURGERY OFFICE NOTE  Referring Provider is [name], Kathlene November, MD PCP is No PCP Per Patient   HPI:  Patient returns for follow-up of stage D severe symptomatically primary mitral regurgitation and atrial fibrillation. He was originally seen in consultation on 09/19/2015 and more recently on 10/06/2015. He returns to the office today with tentative plans to proceed with surgery on Wednesday, 10/22/2015. He reports no new problems or complaints over the last few weeks. Unfortunately he has not been able to quit smoking.  He reports a persistent cough that is occasionally productive of small amount of yellow sputum. He denies any fevers or chills.  He states that he has had some mild abdominal discomfort associated with amiodarone therapy but he has been able to tolerate it without any difficulty.    Current Outpatient Prescriptions  Medication Sig Dispense Refill  . amiodarone (PACERONE) 200 MG tablet Take 1 tablet (200 mg total) by mouth 2 (two) times daily with a meal. 60 tablet 0  . diltiazem (CARDIZEM CD) 120 MG 24 hr capsule Take 1 capsule (120 mg total) by mouth daily. 30 capsule 11  . diltiazem (CARDIZEM) 30 MG tablet Take 1 tablet (30 mg total) by mouth 3 (three) times daily as needed. (Patient taking differently: Take 30 mg by mouth 3 (three) times daily as needed (pain). ) 90 tablet 6  . furosemide (LASIX) 40 MG tablet Take 1 tablet (40 mg total) by mouth 2 (two) times daily as needed. (Patient taking differently: Take 40 mg by mouth every other day. ) 180 tablet 3  . potassium chloride 20 MEQ TBCR Take 20 mEq by mouth 2 (two) times daily as needed. (Patient taking differently: Take 20 mEq by mouth 3 (three) times a week. ) 60 tablet 6  . apixaban (ELIQUIS) 5 MG TABS tablet Take 1 tablet (5 mg total) by mouth 2 (two) times daily. (Patient not taking: Reported on 10/06/2015) 60  tablet 11   No current facility-administered medications for this visit.      Physical Exam:   BP 125/75 mmHg  Pulse 60  Resp 20  Ht 6\' 1"  (1.854 m)  Wt 202 lb (91.627 kg)  BMI 26.66 kg/m2  SpO2 97%  General:  Well-appearing  Chest:   Clear  CV:   Regular rate and rhythm with prominent holosystolic murmur  Incisions:  n/a  Abdomen:  Soft and nontender  Extremities:  Warm and well-perfused  Diagnostic Tests:  CHEST 2 VIEW  COMPARISON: Portable chest x-ray dated August 15, 2015  FINDINGS: The lungs are well-expanded. The interstitial markings are mildly increased bilaterally. The heart is top-normal in size. The pulmonary vascularity is mildly prominent centrally, but not greatly changed from the previous study. There is no pleural effusion or pneumothorax. The mediastinum is normal in width. There is mild compression of the body of T8 which is not new.  IMPRESSION: Mild interstitial prominence is consistent with low-grade interstitial edema. There is no alveolar pneumonia nor pleural effusion.   Electronically Signed  By: David Martinique M.D.  On: 10/20/2015 13:48   Impression:  Patient has stage D severe symptomatic primary mitral regurgitation and recent onset persistent atrial fibrillation. He describes a long history of symptoms of exertional shortness of breath that has progressed over the past year consistent with chronic  diastolic congestive heart failure. Symptoms seemed to wax and wane in severity to some degree and are likely affected by the presence of underlying COPD. The patient also has a long history of palpitations and atypical chest pain, and recently he developed new onset persistent atrial fibrillation. He has been maintaining sinus rhythm since he underwent cardioversion 6 weeks ago. I have personally reviewed the patient's recent transthoracic and transesophageal echocardiograms. The patient has myxomatous degenerative disease of the  mitral valve with a large flail segment of the posterior leaflet and severe mitral regurgitation. There is severe left atrial enlargement. Left ventricular systolic function remains normal. I agree that the patient needs elective mitral valve repair. He might benefit from concomitant Maze procedure. Diagnostic cardiac catheterization is notable for the absence of significant flow limiting coronary artery disease. CT angiography demonstrates very mild aneurysmal enlargement of the ascending thoracic aorta with maximum transverse diameter reported 4.1 cm. I have personally reviewed the patient's recent cath and CT scan. The patient appears to be a reasonably good candidate for minimally invasive approach for surgery.  Plan:  The patient and his wife were again counseled at length regarding the indications, risks and potential benefits of mitral valve repair. The rationale for elective surgery has been explained, including a comparison between surgery and continued medical therapy with close follow-up. The likelihood of successful and durable valve repair has been discussed with particular reference to the findings of their recent echocardiogram. Based upon these findings and previous experience, I have quoted them a greater than 95 percent likelihood of successful valve repair. In the unlikely event that their valve cannot be successfully repaired, we discussed the possibility of replacing the mitral valve using a mechanical prosthesis with the attendant need for long-term anticoagulation versus the alternative of replacing it using a bioprosthetic tissue valve with its potential for late structural valve deterioration and failure, depending upon the patient's longevity. The patient understands and accepts all potential risks of surgery including but not limited to risk of death, stroke or other neurologic complication, myocardial infarction, congestive heart failure, respiratory failure, renal failure,  bleeding requiring transfusion and/or reexploration, arrhythmia, infection or other wound complications, pneumonia, pleural and/or pericardial effusion, pulmonary embolus, aortic dissection or other major vascular complication, or delayed complications related to valve repair or replacement including but not limited to structural valve deterioration and failure, thrombosis, embolization, endocarditis, or paravalvular leak. Alternative surgical approaches have been discussed including a comparison between conventional sternotomy and minimally-invasive techniques. The relative risks and benefits of each have been reviewed as they pertain to the patient's specific circumstances, and all of their questions have been addressed. Specific risks potentially related to the minimally-invasive approach were discussed at length, including but not limited to risk of conversion to full or partial sternotomy, aortic dissection or other major vascular complication, unilateral acute lung injury or pulmonary edema, phrenic nerve dysfunction or paralysis, rib fracture, chronic pain, lung hernia, or lymphocele. All of their questions have been answered. We plan to proceed with surgery on Wednesday, 10/22/2015.    I spent in excess of 15 minutes during the conduct of this office consultation and >50% of this time involved direct face-to-face encounter with the patient for counseling and/or coordination of their care.    Valentina Gu. Roxy Manns, MD 10/20/2015 3:13 PM

## 2015-10-20 NOTE — Progress Notes (Signed)
EKG reviewed by Willeen Cass, FNP Pt reports he has had chest pain for many years, none today. He reports he had chest pain yesterday, but was no different. Chest pain comes and goes at different severity and different locations of the chest. He is scheduled to see Dr Roxy Manns today, encouraged him to let Dr Roxy Manns know about his pain. His wife states that they told Dr Roxy Manns last time he saw him, and that the only difference is that he has had what feels like a flutter for the past 3-4 days, but no difference in the chest pain.

## 2015-10-20 NOTE — Pre-Procedure Instructions (Signed)
Michael Delacruz  10/20/2015      [id number] - [place],  - [address] [place] [address] Phone: [phone] Fax: [phone]    Your procedure is scheduled on Nov 2  Report to [facility] at 630 A.M.  Call this number if you have problems the morning of surgery:  [phone]   Remember:  Do not eat food or drink liquids after midnight.  Take these medicines the morning of surgery with A SIP OF WATER amiodarone(Pacerone),  Diltiazem (Cardizem) Stop taking aspirin, Ibuprofen, Herbal medications, Fish Oil, BC's, Goody's, Aleve   Do not wear jewelry, make-up or nail polish.  Do not wear lotions, powders, or perfumes.  You may wear deodorant.  Do not shave 48 hours prior to surgery.  Men may shave face and neck.  Do not bring valuables to the hospital.  [facility] is not responsible for any belongings or valuables.  Contacts, dentures or bridgework may not be worn into surgery.  Leave your suitcase in the car.  After surgery it may be brought to your room.  For patients admitted to the hospital, discharge time will be determined by your treatment team.  Patients discharged the day of surgery will not be allowed to drive home.    Special instructions:  [place] - Preparing for Surgery  Before surgery, you can play an important role.  Because skin is not sterile, your skin needs to be as free of germs as possible.  You can reduce the number of germs on you skin by washing with CHG (chlorahexidine gluconate) soap before surgery.  CHG is an antiseptic cleaner which kills germs and bonds with the skin to continue killing germs even after washing.  Please DO NOT use if you have an allergy to CHG or antibacterial soaps.  If your skin becomes reddened/irritated stop using the CHG and inform your nurse when you arrive at Short Stay.  Do not shave (including legs and underarms) for at least 48 hours prior to the first CHG shower.  You  may shave your face.  Please follow these instructions carefully:   1.  Shower with CHG Soap the night before surgery and the  morning of Surgery.  2.  If you choose to wash your hair, wash your hair first as usual with your  normal shampoo.  3.  After you shampoo, rinse your hair and body thoroughly to remove the Shampoo.  4.  Use CHG as you would any other liquid soap.  You can apply chg directly  to the skin and wash gently with scrungie or a clean washcloth.  5.  Apply the CHG Soap to your body ONLY FROM THE NECK DOWN.    Do not use on open wounds or open sores.  Avoid contact with your eyes,  ears, mouth and genitals (private parts).  Wash genitals (private parts)   with your normal soap.  6.  Wash thoroughly, paying special attention to the area where your surgery   will be performed.  7.  Thoroughly rinse your body with warm water from the neck down.  8.  DO NOT shower/wash with your normal soap after using and rinsing off the CHG Soap.  9.  Pat yourself dry with a clean towel.            10.  Wear clean pajamas.            11.  Place clean  sheets on your bed the night of your first shower and do not  sleep with pets.  Day of Surgery  Do not apply any lotions/deoderants the morning of surgery.  Please wear clean clothes to the hospital/surgery center.     Please read over the following fact sheets that you were given. Pain Booklet, Coughing and Deep Breathing, Blood Transfusion Information, Open Heart Packet, MRSA Information and Surgical Site Infection Prevention

## 2015-10-20 NOTE — Progress Notes (Addendum)
VASCULAR LAB PRELIMINARY  PRELIMINARY  PRELIMINARY  PRELIMINARY  Pre-op Cardiac Surgery  Carotid Findings:   Mild plaque with a 1-39% reduction.  Upper Extremity Right Left  Brachial Pressures 126 130  Radial Waveforms Normal Normal  Ulnar Waveforms Normal Normal  Palmar Arch (Allen's Test) WNL Abnormal   Findings:  Right Doppler waveforms remain normal with radial and ulnar compression. Left ulnar obliterate with compression.    Alla German, RVT 10/20/2015, 2:19 PM

## 2015-10-21 LAB — HEMOGLOBIN A1C
Hgb A1c MFr Bld: 5.6 % (ref 4.8–5.6)
Mean Plasma Glucose: 114 mg/dL

## 2015-10-21 MED ORDER — CHLORHEXIDINE GLUCONATE 4 % EX LIQD: 30.0000 mL | CUTANEOUS | Status: DC

## 2015-10-21 MED ORDER — INSULIN REGULAR HUMAN 100 UNIT/ML IJ SOLN
INTRAMUSCULAR | Status: AC
  Administered 2015-10-22: 1.4 [IU]/h via INTRAVENOUS
  Filled 2015-10-21: qty 2.5

## 2015-10-21 MED ORDER — NITROGLYCERIN IN D5W 200-5 MCG/ML-% IV SOLN
2.0000 ug/min | INTRAVENOUS | Status: AC
Start: 2015-10-22 — End: 2015-10-22
  Administered 2015-10-22: 5 ug/min via INTRAVENOUS
  Filled 2015-10-21: qty 250

## 2015-10-21 MED ORDER — AMINOCAPROIC ACID 250 MG/ML IV SOLN
INTRAVENOUS | Status: AC
  Administered 2015-10-22: 14 mL/h via INTRAVENOUS
  Filled 2015-10-21: qty 40

## 2015-10-21 MED ORDER — PHENYLEPHRINE HCL 10 MG/ML IJ SOLN
30.0000 ug/min | INTRAVENOUS | Status: AC
  Administered 2015-10-22: 50 ug/min via INTRAVENOUS
  Filled 2015-10-21: qty 2

## 2015-10-21 MED ORDER — VANCOMYCIN HCL 1000 MG IV SOLR
INTRAVENOUS | Status: AC
  Filled 2015-10-21: qty 1000

## 2015-10-21 MED ORDER — SODIUM CHLORIDE 0.9 % IV SOLN
INTRAVENOUS | Status: DC
  Filled 2015-10-21: qty 30

## 2015-10-21 MED ORDER — DEXTROSE 5 % IV SOLN
1.5000 g | INTRAVENOUS | Status: AC
  Administered 2015-10-22: 1.5 g via INTRAVENOUS
  Administered 2015-10-22: .75 g via INTRAVENOUS
  Filled 2015-10-21 (×2): qty 1.5

## 2015-10-21 MED ORDER — METOPROLOL TARTRATE 12.5 MG HALF TABLET: 12.5000 mg | ORAL_TABLET | Freq: Once | ORAL | Status: DC

## 2015-10-21 MED ORDER — EPINEPHRINE HCL 1 MG/ML IJ SOLN
0.0000 ug/min | INTRAVENOUS | Status: DC
  Filled 2015-10-21: qty 4

## 2015-10-21 MED ORDER — DOPAMINE-DEXTROSE 3.2-5 MG/ML-% IV SOLN
0.0000 ug/kg/min | INTRAVENOUS | Status: DC
  Filled 2015-10-21 (×2): qty 250

## 2015-10-21 MED ORDER — PLASMA-LYTE 148 IV SOLN
INTRAVENOUS | Status: DC
  Filled 2015-10-21: qty 2.5

## 2015-10-21 MED ORDER — VANCOMYCIN HCL 10 G IV SOLR
1500.0000 mg | INTRAVENOUS | Status: AC
  Administered 2015-10-22: 1500 mg via INTRAVENOUS
  Filled 2015-10-21: qty 1500

## 2015-10-21 MED ORDER — MAGNESIUM SULFATE 50 % IJ SOLN
40.0000 meq | INTRAMUSCULAR | Status: DC
  Filled 2015-10-21: qty 10

## 2015-10-21 MED ORDER — CHLORHEXIDINE GLUCONATE 0.12 % MT SOLN
15.0000 mL | Freq: Once | OROMUCOSAL | Status: DC
  Filled 2015-10-21: qty 15

## 2015-10-21 MED ORDER — DEXMEDETOMIDINE HCL IN NACL 400 MCG/100ML IV SOLN
0.1000 ug/kg/h | INTRAVENOUS | Status: AC
  Administered 2015-10-22: .2 ug/kg/h via INTRAVENOUS
  Filled 2015-10-21: qty 100

## 2015-10-21 MED ORDER — POTASSIUM CHLORIDE 2 MEQ/ML IV SOLN
80.0000 meq | INTRAVENOUS | Status: DC
  Filled 2015-10-21: qty 40

## 2015-10-21 MED ORDER — DEXTROSE 5 % IV SOLN
750.0000 mg | INTRAVENOUS | Status: DC
  Filled 2015-10-21: qty 750

## 2015-10-21 MED ORDER — GLUTARALDEHYDE 0.625% SOAKING SOLUTION
TOPICAL | Status: DC | PRN
  Filled 2015-10-21: qty 50

## 2015-10-21 NOTE — Anesthesia Preprocedure Evaluation (Addendum)
Anesthesia Evaluation  Patient identified by MRN, date of birth, ID band Patient awake    Reviewed: Allergy & Precautions, NPO status , Patient's Chart, lab work & pertinent test results  History of Anesthesia Complications Negative for: history of anesthetic complications  Airway Mallampati: II  TM Distance: >3 FB Neck ROM: Full    Dental  (+) Edentulous Upper, Edentulous Lower   Pulmonary asthma , COPD, Current Smoker,    breath sounds clear to auscultation       Cardiovascular hypertension, Pt. on medications (-) angina+ CAD ('16 cath: non-obstructive RCA and Cx )  + dysrhythmias Atrial Fibrillation + Valvular Problems/Murmurs MR  Rhythm:Irregular Rate:Normal  8/16 ECHO: EF 55-60%, severe MR with posterior leaflet prolapse   Neuro/Psych CVA, No Residual Symptoms    GI/Hepatic Neg liver ROS, GERD  Controlled,  Endo/Other  negative endocrine ROS  Renal/GU negative Renal ROS     Musculoskeletal  (+) Fibromyalgia -  Abdominal   Peds  Hematology negative hematology ROS (+)   Anesthesia Other Findings   Reproductive/Obstetrics                           Anesthesia Physical Anesthesia Plan  ASA: IV  Anesthesia Plan: General   Post-op Pain Management:    Induction: Intravenous  Airway Management Planned: Oral ETT and Double Lumen EBT  Additional Equipment: Arterial line, CVP, PA Cath, 3D TEE and Ultrasound Guidance Line Placement  Intra-op Plan:   Post-operative Plan: Post-operative intubation/ventilation  Informed Consent: I have reviewed the patients History and Physical, chart, labs and discussed the procedure including the risks, benefits and alternatives for the proposed anesthesia with the patient or authorized representative who has indicated his/her understanding and acceptance.     Plan Discussed with: Surgeon and CRNA  Anesthesia Plan Comments: (Plan routine monitors, A  line, PA cath, GETA with TEE and post op ventilation)        Anesthesia Quick Evaluation

## 2015-10-22 ENCOUNTER — Inpatient Hospital Stay (HOSPITAL_COMMUNITY): Payer: Medicare HMO

## 2015-10-22 ENCOUNTER — Encounter (HOSPITAL_COMMUNITY): Payer: Self-pay | Admitting: *Deleted

## 2015-10-22 ENCOUNTER — Inpatient Hospital Stay (HOSPITAL_COMMUNITY): Payer: Medicare HMO | Admitting: Emergency Medicine

## 2015-10-22 ENCOUNTER — Inpatient Hospital Stay (HOSPITAL_COMMUNITY)
Admission: RE | Admit: 2015-10-22 | Discharge: 2015-10-29 | DRG: 220 | Disposition: A | Discharge status: Home / Self Care | Payer: Medicare HMO | Source: Ambulatory Visit | Attending: Thoracic Surgery (Cardiothoracic Vascular Surgery) | Admitting: Thoracic Surgery (Cardiothoracic Vascular Surgery)

## 2015-10-22 ENCOUNTER — Encounter (HOSPITAL_COMMUNITY)
Admission: RE | Disposition: A | Discharge status: Home / Self Care | Payer: Medicare HMO | Source: Ambulatory Visit | Attending: Thoracic Surgery (Cardiothoracic Vascular Surgery)

## 2015-10-22 ENCOUNTER — Inpatient Hospital Stay (HOSPITAL_COMMUNITY): Payer: Medicare HMO | Admitting: Certified Registered"

## 2015-10-22 DIAGNOSIS — I340 Nonrheumatic mitral (valve) insufficiency: Secondary | ICD-10-CM | POA: Diagnosis present

## 2015-10-22 DIAGNOSIS — F419 Anxiety disorder, unspecified: Secondary | ICD-10-CM | POA: Diagnosis present

## 2015-10-22 DIAGNOSIS — I341 Nonrheumatic mitral (valve) prolapse: Secondary | ICD-10-CM | POA: Diagnosis present

## 2015-10-22 DIAGNOSIS — J45909 Unspecified asthma, uncomplicated: Secondary | ICD-10-CM | POA: Diagnosis present

## 2015-10-22 DIAGNOSIS — D62 Acute posthemorrhagic anemia: Secondary | ICD-10-CM | POA: Diagnosis not present

## 2015-10-22 DIAGNOSIS — E54 Ascorbic acid deficiency: Secondary | ICD-10-CM | POA: Diagnosis present

## 2015-10-22 DIAGNOSIS — I209 Angina pectoris, unspecified: Secondary | ICD-10-CM | POA: Diagnosis present

## 2015-10-22 DIAGNOSIS — I481 Persistent atrial fibrillation: Secondary | ICD-10-CM | POA: Diagnosis not present

## 2015-10-22 DIAGNOSIS — I5032 Chronic diastolic (congestive) heart failure: Secondary | ICD-10-CM | POA: Diagnosis present

## 2015-10-22 DIAGNOSIS — R442 Other hallucinations: Secondary | ICD-10-CM | POA: Diagnosis not present

## 2015-10-22 DIAGNOSIS — K219 Gastro-esophageal reflux disease without esophagitis: Secondary | ICD-10-CM | POA: Diagnosis present

## 2015-10-22 DIAGNOSIS — E785 Hyperlipidemia, unspecified: Secondary | ICD-10-CM | POA: Diagnosis present

## 2015-10-22 DIAGNOSIS — I4891 Unspecified atrial fibrillation: Secondary | ICD-10-CM | POA: Diagnosis present

## 2015-10-22 DIAGNOSIS — I5022 Chronic systolic (congestive) heart failure: Secondary | ICD-10-CM | POA: Diagnosis present

## 2015-10-22 DIAGNOSIS — E876 Hypokalemia: Secondary | ICD-10-CM | POA: Diagnosis not present

## 2015-10-22 DIAGNOSIS — M797 Fibromyalgia: Secondary | ICD-10-CM | POA: Diagnosis present

## 2015-10-22 DIAGNOSIS — I272 Other secondary pulmonary hypertension: Secondary | ICD-10-CM | POA: Diagnosis present

## 2015-10-22 DIAGNOSIS — I2510 Atherosclerotic heart disease of native coronary artery without angina pectoris: Secondary | ICD-10-CM | POA: Diagnosis present

## 2015-10-22 DIAGNOSIS — J9811 Atelectasis: Secondary | ICD-10-CM | POA: Diagnosis not present

## 2015-10-22 DIAGNOSIS — J449 Chronic obstructive pulmonary disease, unspecified: Secondary | ICD-10-CM | POA: Diagnosis present

## 2015-10-22 DIAGNOSIS — H538 Other visual disturbances: Secondary | ICD-10-CM | POA: Diagnosis present

## 2015-10-22 DIAGNOSIS — D6959 Other secondary thrombocytopenia: Secondary | ICD-10-CM | POA: Diagnosis present

## 2015-10-22 DIAGNOSIS — R001 Bradycardia, unspecified: Secondary | ICD-10-CM | POA: Diagnosis not present

## 2015-10-22 DIAGNOSIS — I69398 Other sequelae of cerebral infarction: Secondary | ICD-10-CM

## 2015-10-22 DIAGNOSIS — I058 Other rheumatic mitral valve diseases: Principal | ICD-10-CM | POA: Diagnosis present

## 2015-10-22 DIAGNOSIS — Z8679 Personal history of other diseases of the circulatory system: Secondary | ICD-10-CM

## 2015-10-22 DIAGNOSIS — T402X5A Adverse effect of other opioids, initial encounter: Secondary | ICD-10-CM | POA: Diagnosis not present

## 2015-10-22 DIAGNOSIS — Z885 Allergy status to narcotic agent status: Secondary | ICD-10-CM

## 2015-10-22 DIAGNOSIS — F17210 Nicotine dependence, cigarettes, uncomplicated: Secondary | ICD-10-CM | POA: Diagnosis present

## 2015-10-22 DIAGNOSIS — I110 Hypertensive heart disease with heart failure: Secondary | ICD-10-CM | POA: Diagnosis present

## 2015-10-22 DIAGNOSIS — Z881 Allergy status to other antibiotic agents status: Secondary | ICD-10-CM

## 2015-10-22 DIAGNOSIS — I509 Heart failure, unspecified: Secondary | ICD-10-CM

## 2015-10-22 DIAGNOSIS — Z8619 Personal history of other infectious and parasitic diseases: Secondary | ICD-10-CM

## 2015-10-22 DIAGNOSIS — R0602 Shortness of breath: Secondary | ICD-10-CM | POA: Diagnosis present

## 2015-10-22 DIAGNOSIS — Z7901 Long term (current) use of anticoagulants: Secondary | ICD-10-CM | POA: Diagnosis not present

## 2015-10-22 DIAGNOSIS — Z9889 Other specified postprocedural states: Secondary | ICD-10-CM

## 2015-10-22 HISTORY — PX: TEE WITHOUT CARDIOVERSION: SHX5443

## 2015-10-22 HISTORY — PX: MINIMALLY INVASIVE MAZE PROCEDURE: SHX6244

## 2015-10-22 HISTORY — DX: Personal history of other diseases of the circulatory system: Z86.79

## 2015-10-22 HISTORY — DX: Other specified postprocedural states: Z98.890

## 2015-10-22 HISTORY — PX: MITRAL VALVE REPAIR: SHX2039

## 2015-10-22 LAB — POCT I-STAT, CHEM 8
BUN: 7 mg/dL (ref 6–20)
BUN: 6 mg/dL (ref 6–20)
BUN: 7 mg/dL (ref 6–20)
BUN: 7 mg/dL (ref 6–20)
BUN: 7 mg/dL (ref 6–20)
BUN: 7 mg/dL (ref 6–20)
BUN: 7 mg/dL (ref 6–20)
BUN: 7 mg/dL (ref 6–20)
Calcium, Ion: 1.29 mmol/L (ref 1.13–1.30)
Calcium, Ion: 1.12 mmol/L — ABNORMAL LOW (ref 1.13–1.30)
Calcium, Ion: 1.31 mmol/L — ABNORMAL HIGH (ref 1.13–1.30)
Calcium, Ion: 1.16 mmol/L (ref 1.13–1.30)
Calcium, Ion: 1.15 mmol/L (ref 1.13–1.30)
Calcium, Ion: 1.16 mmol/L (ref 1.13–1.30)
Calcium, Ion: 1.13 mmol/L (ref 1.13–1.30)
Calcium, Ion: 1.14 mmol/L (ref 1.13–1.30)
Chloride: 104 mmol/L (ref 101–111)
Chloride: 103 mmol/L (ref 101–111)
Chloride: 104 mmol/L (ref 101–111)
Chloride: 104 mmol/L (ref 101–111)
Chloride: 102 mmol/L (ref 101–111)
Chloride: 105 mmol/L (ref 101–111)
Chloride: 106 mmol/L (ref 101–111)
Chloride: 105 mmol/L (ref 101–111)
Creatinine, Ser: 0.9 mg/dL (ref 0.61–1.24)
Creatinine, Ser: 0.8 mg/dL (ref 0.61–1.24)
Creatinine, Ser: 1 mg/dL (ref 0.61–1.24)
Creatinine, Ser: 0.8 mg/dL (ref 0.61–1.24)
Creatinine, Ser: 0.8 mg/dL (ref 0.61–1.24)
Creatinine, Ser: 0.8 mg/dL (ref 0.61–1.24)
Creatinine, Ser: 0.7 mg/dL (ref 0.61–1.24)
Creatinine, Ser: 0.9 mg/dL (ref 0.61–1.24)
Glucose, Bld: 128 mg/dL — ABNORMAL HIGH (ref 65–99)
Glucose, Bld: 115 mg/dL — ABNORMAL HIGH (ref 65–99)
Glucose, Bld: 121 mg/dL — ABNORMAL HIGH (ref 65–99)
Glucose, Bld: 155 mg/dL — ABNORMAL HIGH (ref 65–99)
Glucose, Bld: 157 mg/dL — ABNORMAL HIGH (ref 65–99)
Glucose, Bld: 143 mg/dL — ABNORMAL HIGH (ref 65–99)
Glucose, Bld: 125 mg/dL — ABNORMAL HIGH (ref 65–99)
Glucose, Bld: 107 mg/dL — ABNORMAL HIGH (ref 65–99)
HCT: 37 % — ABNORMAL LOW (ref 39.0–52.0)
HCT: 31 % — ABNORMAL LOW (ref 39.0–52.0)
HCT: 37 % — ABNORMAL LOW (ref 39.0–52.0)
HCT: 31 % — ABNORMAL LOW (ref 39.0–52.0)
HCT: 27 % — ABNORMAL LOW (ref 39.0–52.0)
HCT: 27 % — ABNORMAL LOW (ref 39.0–52.0)
HCT: 27 % — ABNORMAL LOW (ref 39.0–52.0)
HCT: 29 % — ABNORMAL LOW (ref 39.0–52.0)
Hemoglobin: 12.6 g/dL — ABNORMAL LOW (ref 13.0–17.0)
Hemoglobin: 10.5 g/dL — ABNORMAL LOW (ref 13.0–17.0)
Hemoglobin: 12.6 g/dL — ABNORMAL LOW (ref 13.0–17.0)
Hemoglobin: 10.5 g/dL — ABNORMAL LOW (ref 13.0–17.0)
Hemoglobin: 9.2 g/dL — ABNORMAL LOW (ref 13.0–17.0)
Hemoglobin: 9.2 g/dL — ABNORMAL LOW (ref 13.0–17.0)
Hemoglobin: 9.2 g/dL — ABNORMAL LOW (ref 13.0–17.0)
Hemoglobin: 9.9 g/dL — ABNORMAL LOW (ref 13.0–17.0)
Potassium: 3.8 mmol/L (ref 3.5–5.1)
Potassium: 3.9 mmol/L (ref 3.5–5.1)
Potassium: 4.1 mmol/L (ref 3.5–5.1)
Potassium: 4.3 mmol/L (ref 3.5–5.1)
Potassium: 3.8 mmol/L (ref 3.5–5.1)
Potassium: 4.2 mmol/L (ref 3.5–5.1)
Potassium: 5.3 mmol/L — ABNORMAL HIGH (ref 3.5–5.1)
Potassium: 4.2 mmol/L (ref 3.5–5.1)
Sodium: 139 mmol/L (ref 135–145)
Sodium: 138 mmol/L (ref 135–145)
Sodium: 140 mmol/L (ref 135–145)
Sodium: 139 mmol/L (ref 135–145)
Sodium: 138 mmol/L (ref 135–145)
Sodium: 138 mmol/L (ref 135–145)
Sodium: 138 mmol/L (ref 135–145)
Sodium: 141 mmol/L (ref 135–145)
TCO2: 26 mmol/L (ref 0–100)
TCO2: 23 mmol/L (ref 0–100)
TCO2: 26 mmol/L (ref 0–100)
TCO2: 26 mmol/L (ref 0–100)
TCO2: 25 mmol/L (ref 0–100)
TCO2: 24 mmol/L (ref 0–100)
TCO2: 24 mmol/L (ref 0–100)
TCO2: 23 mmol/L (ref 0–100)

## 2015-10-22 LAB — POCT I-STAT 3, ART BLOOD GAS (G3+)
Acid-base deficit: 1 mmol/L (ref 0.0–2.0)
Acid-base deficit: 5 mmol/L — ABNORMAL HIGH (ref 0.0–2.0)
Acid-base deficit: 4 mmol/L — ABNORMAL HIGH (ref 0.0–2.0)
Acid-base deficit: 5 mmol/L — ABNORMAL HIGH (ref 0.0–2.0)
Bicarbonate: 25.4 mEq/L — ABNORMAL HIGH (ref 20.0–24.0)
Bicarbonate: 23.3 mEq/L (ref 20.0–24.0)
Bicarbonate: 23.3 mEq/L (ref 20.0–24.0)
Bicarbonate: 20.9 mEq/L (ref 20.0–24.0)
O2 Saturation: 100 %
O2 Saturation: 97 %
O2 Saturation: 100 %
O2 Saturation: 86 %
Patient temperature: 35.5
Patient temperature: 35.3
TCO2: 27 mmol/L (ref 0–100)
TCO2: 25 mmol/L (ref 0–100)
TCO2: 25 mmol/L (ref 0–100)
TCO2: 22 mmol/L (ref 0–100)
pCO2 arterial: 52.3 mmHg — ABNORMAL HIGH (ref 35.0–45.0)
pCO2 arterial: 56.3 mmHg — ABNORMAL HIGH (ref 35.0–45.0)
pCO2 arterial: 50.9 mmHg — ABNORMAL HIGH (ref 35.0–45.0)
pCO2 arterial: 38.3 mmHg (ref 35.0–45.0)
pH, Arterial: 7.295 — ABNORMAL LOW (ref 7.350–7.450)
pH, Arterial: 7.224 — ABNORMAL LOW (ref 7.350–7.450)
pH, Arterial: 7.261 — ABNORMAL LOW (ref 7.350–7.450)
pH, Arterial: 7.337 — ABNORMAL LOW (ref 7.350–7.450)
pO2, Arterial: 508 mmHg — ABNORMAL HIGH (ref 80.0–100.0)
pO2, Arterial: 115 mmHg — ABNORMAL HIGH (ref 80.0–100.0)
pO2, Arterial: 202 mmHg — ABNORMAL HIGH (ref 80.0–100.0)
pO2, Arterial: 50 mmHg — ABNORMAL LOW (ref 80.0–100.0)

## 2015-10-22 LAB — POCT I-STAT GLUCOSE
Glucose, Bld: 101 mg/dL — ABNORMAL HIGH (ref 65–99)
Operator id: 3293

## 2015-10-22 LAB — PLATELET COUNT: Platelets: 92 10*3/uL — ABNORMAL LOW (ref 150–400)

## 2015-10-22 LAB — PROTIME-INR
INR: 1.27 (ref 0.00–1.49)
Prothrombin Time: 16.1 seconds — ABNORMAL HIGH (ref 11.6–15.2)

## 2015-10-22 LAB — POCT I-STAT 4, (NA,K, GLUC, HGB,HCT)
Glucose, Bld: 93 mg/dL (ref 65–99)
HCT: 34 % — ABNORMAL LOW (ref 39.0–52.0)
Hemoglobin: 11.6 g/dL — ABNORMAL LOW (ref 13.0–17.0)
Potassium: 4.2 mmol/L (ref 3.5–5.1)
Sodium: 140 mmol/L (ref 135–145)

## 2015-10-22 LAB — CBC
HCT: 35.7 % — ABNORMAL LOW (ref 39.0–52.0)
Hemoglobin: 12 g/dL — ABNORMAL LOW (ref 13.0–17.0)
MCH: 31.1 pg (ref 26.0–34.0)
MCHC: 33.6 g/dL (ref 30.0–36.0)
MCV: 92.5 fL (ref 78.0–100.0)
Platelets: 99 10*3/uL — ABNORMAL LOW (ref 150–400)
RBC: 3.86 MIL/uL — ABNORMAL LOW (ref 4.22–5.81)
RDW: 13.4 % (ref 11.5–15.5)
WBC: 11.6 10*3/uL — ABNORMAL HIGH (ref 4.0–10.5)

## 2015-10-22 LAB — GLUCOSE, CAPILLARY
Glucose-Capillary: 85 mg/dL (ref 65–99)
Glucose-Capillary: 84 mg/dL (ref 65–99)
Glucose-Capillary: 105 mg/dL — ABNORMAL HIGH (ref 65–99)

## 2015-10-22 LAB — MAGNESIUM: Magnesium: 2.1 mg/dL (ref 1.7–2.4)

## 2015-10-22 LAB — HEMOGLOBIN AND HEMATOCRIT, BLOOD
HCT: 28 % — ABNORMAL LOW (ref 39.0–52.0)
Hemoglobin: 9.4 g/dL — ABNORMAL LOW (ref 13.0–17.0)

## 2015-10-22 LAB — APTT: aPTT: 40 seconds — ABNORMAL HIGH (ref 24–37)

## 2015-10-22 SURGERY — REPAIR, MITRAL VALVE, MINIMALLY INVASIVE
Anesthesia: General | Site: Chest | Laterality: Right

## 2015-10-22 MED ORDER — ONDANSETRON HCL 4 MG/2ML IJ SOLN
INTRAMUSCULAR | Status: AC
  Filled 2015-10-22: qty 2

## 2015-10-22 MED ORDER — MAGNESIUM SULFATE 4 GM/100ML IV SOLN
4.0000 g | Freq: Once | INTRAVENOUS | Status: AC
  Administered 2015-10-22: 4 g via INTRAVENOUS
  Filled 2015-10-22: qty 100

## 2015-10-22 MED ORDER — MILRINONE IN DEXTROSE 20 MG/100ML IV SOLN
INTRAVENOUS | Status: DC | PRN
  Administered 2015-10-22: .2 ug/kg/min via INTRAVENOUS

## 2015-10-22 MED ORDER — IPRATROPIUM-ALBUTEROL 0.5-2.5 (3) MG/3ML IN SOLN
3.0000 mL | Freq: Four times a day (QID) | RESPIRATORY_TRACT | Status: DC
  Administered 2015-10-22 – 2015-10-24 (×7): 3 mL via RESPIRATORY_TRACT
  Filled 2015-10-22 (×7): qty 3

## 2015-10-22 MED ORDER — FENTANYL CITRATE (PF) 250 MCG/5ML IJ SOLN
INTRAMUSCULAR | Status: AC
  Filled 2015-10-22: qty 5

## 2015-10-22 MED ORDER — LIDOCAINE HCL (CARDIAC) 20 MG/ML IV SOLN
INTRAVENOUS | Status: AC
  Filled 2015-10-22: qty 5

## 2015-10-22 MED ORDER — MIDAZOLAM HCL 10 MG/2ML IJ SOLN
INTRAMUSCULAR | Status: AC
  Filled 2015-10-22: qty 4

## 2015-10-22 MED ORDER — BISACODYL 5 MG PO TBEC
10.0000 mg | DELAYED_RELEASE_TABLET | Freq: Every day | ORAL | Status: DC
  Administered 2015-10-23 – 2015-10-27 (×3): 10 mg via ORAL
  Filled 2015-10-22 (×4): qty 2

## 2015-10-22 MED ORDER — CHLORHEXIDINE GLUCONATE 0.12 % MT SOLN
15.0000 mL | OROMUCOSAL | Status: AC
  Administered 2015-10-22: 15 mL via OROMUCOSAL
  Filled 2015-10-22: qty 15

## 2015-10-22 MED ORDER — MIDAZOLAM HCL 2 MG/2ML IJ SOLN: 2.0000 mg | INTRAMUSCULAR | Status: DC | PRN

## 2015-10-22 MED ORDER — TRAMADOL HCL 50 MG PO TABS
50.0000 mg | ORAL_TABLET | ORAL | Status: DC | PRN
  Administered 2015-10-23 – 2015-10-25 (×6): 100 mg via ORAL
  Administered 2015-10-26: 50 mg via ORAL
  Administered 2015-10-26 – 2015-10-28 (×6): 100 mg via ORAL
  Filled 2015-10-22 (×13): qty 2

## 2015-10-22 MED ORDER — NITROGLYCERIN IN D5W 200-5 MCG/ML-% IV SOLN: 0.0000 ug/min | INTRAVENOUS | Status: DC

## 2015-10-22 MED ORDER — BISACODYL 10 MG RE SUPP: 10.0000 mg | Freq: Every day | RECTAL | Status: DC

## 2015-10-22 MED ORDER — MILRINONE IN DEXTROSE 20 MG/100ML IV SOLN
0.1250 ug/kg/min | INTRAVENOUS | Status: DC
  Filled 2015-10-22: qty 100

## 2015-10-22 MED ORDER — VECURONIUM BROMIDE 10 MG IV SOLR: INTRAVENOUS | Status: DC | PRN

## 2015-10-22 MED ORDER — DOCUSATE SODIUM 100 MG PO CAPS
200.0000 mg | ORAL_CAPSULE | Freq: Every day | ORAL | Status: DC
  Administered 2015-10-23 – 2015-10-27 (×4): 200 mg via ORAL
  Filled 2015-10-22 (×5): qty 2

## 2015-10-22 MED ORDER — SODIUM CHLORIDE 0.9 % IV SOLN: INTRAVENOUS | Status: AC

## 2015-10-22 MED ORDER — ACETAMINOPHEN 650 MG RE SUPP
650.0000 mg | Freq: Once | RECTAL | Status: AC
  Administered 2015-10-22: 650 mg via RECTAL

## 2015-10-22 MED ORDER — METOPROLOL TARTRATE 1 MG/ML IV SOLN: 2.5000 mg | INTRAVENOUS | Status: DC | PRN

## 2015-10-22 MED ORDER — SODIUM CHLORIDE 0.9 % IR SOLN
Status: DC | PRN
  Administered 2015-10-22 (×2): 3000 mL

## 2015-10-22 MED ORDER — PANTOPRAZOLE SODIUM 40 MG PO TBEC
40.0000 mg | DELAYED_RELEASE_TABLET | Freq: Every day | ORAL | Status: DC
  Administered 2015-10-24 – 2015-10-29 (×6): 40 mg via ORAL
  Filled 2015-10-22 (×7): qty 1

## 2015-10-22 MED ORDER — ACETAMINOPHEN 160 MG/5ML PO SOLN: 650.0000 mg | Freq: Once | ORAL | Status: AC

## 2015-10-22 MED ORDER — POTASSIUM CHLORIDE 10 MEQ/50ML IV SOLN: 10.0000 meq | INTRAVENOUS | Status: AC

## 2015-10-22 MED ORDER — ASPIRIN EC 325 MG PO TBEC
325.0000 mg | DELAYED_RELEASE_TABLET | Freq: Every day | ORAL | Status: DC
  Administered 2015-10-23: 325 mg via ORAL
  Filled 2015-10-22 (×3): qty 1

## 2015-10-22 MED ORDER — LACTATED RINGERS IV SOLN
INTRAVENOUS | Status: DC | PRN
  Administered 2015-10-22: 08:00:00 via INTRAVENOUS

## 2015-10-22 MED ORDER — SODIUM CHLORIDE 0.9 % IV SOLN: 250.0000 mL | INTRAVENOUS | Status: DC

## 2015-10-22 MED ORDER — 0.9 % SODIUM CHLORIDE (POUR BTL) OPTIME
TOPICAL | Status: DC | PRN
  Administered 2015-10-22: 5000 mL

## 2015-10-22 MED ORDER — DEXTROSE 5 % IV SOLN
0.0000 ug/min | INTRAVENOUS | Status: DC
  Administered 2015-10-22: 20 ug/min via INTRAVENOUS
  Filled 2015-10-22 (×2): qty 2

## 2015-10-22 MED ORDER — MILRINONE IN DEXTROSE 20 MG/100ML IV SOLN
0.3000 ug/kg/min | INTRAVENOUS | Status: DC
Start: 2015-10-22 — End: 2015-10-23
  Administered 2015-10-23: 0.2 ug/kg/min via INTRAVENOUS
  Filled 2015-10-22: qty 100

## 2015-10-22 MED ORDER — ALBUTEROL SULFATE HFA 108 (90 BASE) MCG/ACT IN AERS
INHALATION_SPRAY | RESPIRATORY_TRACT | Status: DC | PRN
  Administered 2015-10-22: 4 via RESPIRATORY_TRACT

## 2015-10-22 MED ORDER — SODIUM CHLORIDE 0.9 % IJ SOLN
3.0000 mL | Freq: Two times a day (BID) | INTRAMUSCULAR | Status: DC
  Administered 2015-10-23 – 2015-10-28 (×4): 3 mL via INTRAVENOUS

## 2015-10-22 MED ORDER — VECURONIUM BROMIDE 10 MG IV SOLR
INTRAVENOUS | Status: DC | PRN
  Administered 2015-10-22: 5 mg via INTRAVENOUS
  Administered 2015-10-22: 3 mg via INTRAVENOUS
  Administered 2015-10-22: 2 mg via INTRAVENOUS
  Administered 2015-10-22: 10 mg via INTRAVENOUS

## 2015-10-22 MED ORDER — DEXAMETHASONE SODIUM PHOSPHATE 4 MG/ML IJ SOLN
INTRAMUSCULAR | Status: AC
  Filled 2015-10-22: qty 2

## 2015-10-22 MED ORDER — SODIUM CHLORIDE 0.9 % IJ SOLN
3.0000 mL | INTRAMUSCULAR | Status: DC | PRN
Start: 2015-10-23 — End: 2015-10-29

## 2015-10-22 MED ORDER — MIDAZOLAM HCL 5 MG/5ML IJ SOLN
INTRAMUSCULAR | Status: DC | PRN
Start: 2015-10-22 — End: 2015-10-22
  Administered 2015-10-22: 3 mg via INTRAVENOUS
  Administered 2015-10-22: 2 mg via INTRAVENOUS
  Administered 2015-10-22: 3 mg via INTRAVENOUS
  Administered 2015-10-22: 2 mg via INTRAVENOUS

## 2015-10-22 MED ORDER — INSULIN ASPART 100 UNIT/ML ~~LOC~~ SOLN
0.0000 [IU] | SUBCUTANEOUS | Status: DC
  Administered 2015-10-23 (×3): 2 [IU] via SUBCUTANEOUS
  Administered 2015-10-23: 4 [IU] via SUBCUTANEOUS

## 2015-10-22 MED ORDER — LACTATED RINGERS IV SOLN: INTRAVENOUS | Status: DC

## 2015-10-22 MED ORDER — ACETAMINOPHEN 500 MG PO TABS
1000.0000 mg | ORAL_TABLET | Freq: Four times a day (QID) | ORAL | Status: AC
  Administered 2015-10-23 – 2015-10-27 (×15): 1000 mg via ORAL
  Filled 2015-10-22 (×22): qty 2

## 2015-10-22 MED ORDER — ARTIFICIAL TEARS OP OINT
TOPICAL_OINTMENT | OPHTHALMIC | Status: DC | PRN
  Administered 2015-10-22: 1 via OPHTHALMIC

## 2015-10-22 MED ORDER — INSULIN REGULAR BOLUS VIA INFUSION
0.0000 [IU] | Freq: Three times a day (TID) | INTRAVENOUS | Status: DC
Start: 2015-10-23 — End: 2015-10-23
  Filled 2015-10-22: qty 10

## 2015-10-22 MED ORDER — METOPROLOL TARTRATE 25 MG/10 ML ORAL SUSPENSION
12.5000 mg | Freq: Two times a day (BID) | ORAL | Status: DC
  Filled 2015-10-22 (×2): qty 5

## 2015-10-22 MED ORDER — LACTATED RINGERS IV SOLN: 500.0000 mL | Freq: Once | INTRAVENOUS | Status: DC | PRN

## 2015-10-22 MED ORDER — ROCURONIUM BROMIDE 50 MG/5ML IV SOLN
INTRAVENOUS | Status: AC
  Filled 2015-10-22: qty 1

## 2015-10-22 MED ORDER — PROPOFOL 10 MG/ML IV BOLUS
INTRAVENOUS | Status: AC
  Filled 2015-10-22: qty 20

## 2015-10-22 MED ORDER — METOPROLOL TARTRATE 12.5 MG HALF TABLET
12.5000 mg | ORAL_TABLET | Freq: Two times a day (BID) | ORAL | Status: DC
  Filled 2015-10-22 (×2): qty 1

## 2015-10-22 MED ORDER — HEPARIN SODIUM (PORCINE) 1000 UNIT/ML IJ SOLN
INTRAMUSCULAR | Status: DC | PRN
  Administered 2015-10-22: 30000 [IU] via INTRAVENOUS

## 2015-10-22 MED ORDER — PROTAMINE SULFATE 10 MG/ML IV SOLN
INTRAVENOUS | Status: DC | PRN
  Administered 2015-10-22: 220 mg via INTRAVENOUS
  Administered 2015-10-22: 30 mg via INTRAVENOUS

## 2015-10-22 MED ORDER — ACETAMINOPHEN 160 MG/5ML PO SOLN
1000.0000 mg | Freq: Four times a day (QID) | ORAL | Status: DC
  Administered 2015-10-22 – 2015-10-23 (×2): 1000 mg
  Filled 2015-10-22 (×2): qty 40.6

## 2015-10-22 MED ORDER — ONDANSETRON HCL 4 MG/2ML IJ SOLN: 4.0000 mg | Freq: Four times a day (QID) | INTRAMUSCULAR | Status: DC | PRN

## 2015-10-22 MED ORDER — FAMOTIDINE IN NACL 20-0.9 MG/50ML-% IV SOLN
20.0000 mg | Freq: Two times a day (BID) | INTRAVENOUS | Status: AC
  Administered 2015-10-22 – 2015-10-23 (×2): 20 mg via INTRAVENOUS
  Filled 2015-10-22: qty 50

## 2015-10-22 MED ORDER — SODIUM CHLORIDE 0.45 % IV SOLN
INTRAVENOUS | Status: DC | PRN
  Administered 2015-10-22: 20 mL/h via INTRAVENOUS

## 2015-10-22 MED ORDER — AMINOCAPROIC ACID 250 MG/ML IV SOLN
INTRAVENOUS | Status: DC | PRN
  Administered 2015-10-22: 5 g via INTRAVENOUS

## 2015-10-22 MED ORDER — OXYCODONE HCL 5 MG PO TABS
5.0000 mg | ORAL_TABLET | ORAL | Status: DC | PRN
  Administered 2015-10-23 – 2015-10-25 (×8): 10 mg via ORAL
  Filled 2015-10-22 (×8): qty 2

## 2015-10-22 MED ORDER — DEXTROSE 5 % IV SOLN
1.5000 g | Freq: Two times a day (BID) | INTRAVENOUS | Status: AC
  Administered 2015-10-23 – 2015-10-24 (×4): 1.5 g via INTRAVENOUS
  Filled 2015-10-22 (×4): qty 1.5

## 2015-10-22 MED ORDER — FENTANYL CITRATE (PF) 100 MCG/2ML IJ SOLN
50.0000 ug | INTRAMUSCULAR | Status: DC | PRN
  Administered 2015-10-22: 100 ug via INTRAVENOUS
  Administered 2015-10-23: 50 ug via INTRAVENOUS
  Administered 2015-10-23: 100 ug via INTRAVENOUS
  Administered 2015-10-23: 50 ug via INTRAVENOUS
  Administered 2015-10-24: 100 ug via INTRAVENOUS
  Filled 2015-10-22 (×5): qty 2

## 2015-10-22 MED ORDER — ALBUMIN HUMAN 5 % IV SOLN
250.0000 mL | INTRAVENOUS | Status: AC | PRN
  Administered 2015-10-22 – 2015-10-23 (×4): 250 mL via INTRAVENOUS
  Filled 2015-10-22 (×2): qty 250

## 2015-10-22 MED ORDER — FENTANYL CITRATE (PF) 100 MCG/2ML IJ SOLN
INTRAMUSCULAR | Status: DC | PRN
  Administered 2015-10-22: 200 ug via INTRAVENOUS
  Administered 2015-10-22: 700 ug via INTRAVENOUS
  Administered 2015-10-22: 200 ug via INTRAVENOUS
  Administered 2015-10-22: 50 ug via INTRAVENOUS
  Administered 2015-10-22: 100 ug via INTRAVENOUS

## 2015-10-22 MED ORDER — SODIUM CHLORIDE 0.9 % IV SOLN
INTRAVENOUS | Status: DC
  Administered 2015-10-22: 0.5 [IU]/h via INTRAVENOUS
  Filled 2015-10-22 (×2): qty 2.5

## 2015-10-22 MED ORDER — VANCOMYCIN HCL IN DEXTROSE 1-5 GM/200ML-% IV SOLN
INTRAVENOUS | Status: AC
  Filled 2015-10-22: qty 200

## 2015-10-22 MED ORDER — SODIUM CHLORIDE 0.9 % IV SOLN: INTRAVENOUS | Status: DC

## 2015-10-22 MED ORDER — SODIUM CHLORIDE 0.9 % IV SOLN
0.5000 g/h | Freq: Once | INTRAVENOUS | Status: DC
  Administered 2015-10-22: 0.5 g/h via INTRAVENOUS
  Filled 2015-10-22: qty 20

## 2015-10-22 MED ORDER — ROCURONIUM BROMIDE 100 MG/10ML IV SOLN
INTRAVENOUS | Status: DC | PRN
  Administered 2015-10-22 (×2): 50 mg via INTRAVENOUS

## 2015-10-22 MED ORDER — DEXMEDETOMIDINE HCL IN NACL 400 MCG/100ML IV SOLN
0.0000 ug/kg/h | INTRAVENOUS | Status: DC
  Administered 2015-10-22 – 2015-10-23 (×3): 0.7 ug/kg/h via INTRAVENOUS
  Filled 2015-10-22: qty 50
  Filled 2015-10-22 (×2): qty 100

## 2015-10-22 MED ORDER — ASPIRIN 81 MG PO CHEW: 324.0000 mg | CHEWABLE_TABLET | Freq: Every day | ORAL | Status: DC

## 2015-10-22 MED ORDER — PROPOFOL 10 MG/ML IV BOLUS
INTRAVENOUS | Status: DC | PRN
  Administered 2015-10-22: 20 mg via INTRAVENOUS

## 2015-10-22 MED ORDER — VANCOMYCIN HCL IN DEXTROSE 1-5 GM/200ML-% IV SOLN
1000.0000 mg | Freq: Once | INTRAVENOUS | Status: AC
  Administered 2015-10-23: 1000 mg via INTRAVENOUS
  Filled 2015-10-22: qty 200

## 2015-10-22 MED FILL — Magnesium Sulfate Inj 50%: INTRAMUSCULAR | Qty: 10 | Status: AC

## 2015-10-22 MED FILL — Heparin Sodium (Porcine) Inj 1000 Unit/ML: INTRAMUSCULAR | Qty: 30 | Status: AC

## 2015-10-22 MED FILL — Potassium Chloride Inj 2 mEq/ML: INTRAVENOUS | Qty: 40 | Status: AC

## 2015-10-22 SURGICAL SUPPLY — 115 items
ADAPTER CARDIO PERF ANTE/RETRO (ADAPTER) ×3 IMPLANT
ARTICLIP LAA PROCLIP II 45 (Clip) ×3 IMPLANT
BAG DECANTER FOR FLEXI CONT (MISCELLANEOUS) ×6 IMPLANT
BLADE SAW STERNAL (BLADE) ×3 IMPLANT
BLADE SURG 11 STRL SS (BLADE) ×6 IMPLANT
BOOT SUTURE AID YELLOW STND (SUTURE) ×3 IMPLANT
CANISTER SUCTION 2500CC (MISCELLANEOUS) ×6 IMPLANT
CANNULA FEM VENOUS REMOTE 22FR (CANNULA) ×3 IMPLANT
CANNULA FEMORAL ART 14 SM (MISCELLANEOUS) ×6 IMPLANT
CANNULA GUNDRY RCSP 15FR (MISCELLANEOUS) ×3 IMPLANT
CANNULA OPTISITE PERFUSION 16F (CANNULA) IMPLANT
CANNULA OPTISITE PERFUSION 18F (CANNULA) ×3 IMPLANT
CANNULA SUMP PERICARDIAL (CANNULA) ×12 IMPLANT
CARDIOBLATE CARDIAC ABLATION (MISCELLANEOUS)
CATH KIT ON Q 5IN SLV (PAIN MANAGEMENT) IMPLANT
CELLS DAT CNTRL 66122 CELL SVR (MISCELLANEOUS) ×2 IMPLANT
CLAMP ISOLATOR SYNERGY LG (MISCELLANEOUS) ×3 IMPLANT
CONN ST 1/4X3/8  BEN (MISCELLANEOUS) ×3
CONN ST 1/4X3/8 BEN (MISCELLANEOUS) ×6 IMPLANT
CONNECTOR 1/2X3/8X1/2 3 WAY (MISCELLANEOUS) ×1
CONNECTOR 1/2X3/8X1/2 3WAY (MISCELLANEOUS) ×2 IMPLANT
CONT SPEC 4OZ CLIKSEAL STRL BL (MISCELLANEOUS) ×3 IMPLANT
CONT SPEC STER OR (MISCELLANEOUS) ×3 IMPLANT
COVER BACK TABLE 24X17X13 BIG (DRAPES) ×3 IMPLANT
COVER PROBE W GEL 5X96 (DRAPES) ×3 IMPLANT
CRADLE DONUT ADULT HEAD (MISCELLANEOUS) ×3 IMPLANT
DERMABOND ADVANCED (GAUZE/BANDAGES/DRESSINGS) ×1
DERMABOND ADVANCED .7 DNX12 (GAUZE/BANDAGES/DRESSINGS) ×2 IMPLANT
DEVICE ATRICLIP LAA PRCLPII 45 (Clip) ×2 IMPLANT
DEVICE CARDIOBLATE CARDIAC ABL (MISCELLANEOUS) IMPLANT
DEVICE PMI PUNCTURE CLOSURE (MISCELLANEOUS) ×3 IMPLANT
DEVICE SUT CK QUICK LOAD INDV (Prosthesis & Implant Heart) ×12 IMPLANT
DEVICE SUT CK QUICK LOAD MINI (Prosthesis & Implant Heart) ×6 IMPLANT
DEVICE TROCAR PUNCTURE CLOSURE (ENDOMECHANICALS) ×3 IMPLANT
DRAIN CHANNEL 28F RND 3/8 FF (WOUND CARE) ×6 IMPLANT
DRAPE BILATERAL SPLIT (DRAPES) ×3 IMPLANT
DRAPE C-ARM 42X72 X-RAY (DRAPES) ×3 IMPLANT
DRAPE CV SPLIT W-CLR ANES SCRN (DRAPES) ×3 IMPLANT
DRAPE INCISE IOBAN 66X45 STRL (DRAPES) ×12 IMPLANT
DRAPE SLUSH/WARMER DISC (DRAPES) ×3 IMPLANT
DRSG COVADERM 4X8 (GAUZE/BANDAGES/DRESSINGS) ×3 IMPLANT
ELECT BLADE 6.5 EXT (BLADE) ×3 IMPLANT
ELECT REM PT RETURN 9FT ADLT (ELECTROSURGICAL) ×6
ELECTRODE REM PT RTRN 9FT ADLT (ELECTROSURGICAL) ×4 IMPLANT
FEMORAL VENOUS CANN RAP (CANNULA) IMPLANT
GAUZE SPONGE 4X4 12PLY STRL (GAUZE/BANDAGES/DRESSINGS) ×3 IMPLANT
GLOVE ORTHO TXT STRL SZ7.5 (GLOVE) ×9 IMPLANT
GOWN STRL REUS W/ TWL LRG LVL3 (GOWN DISPOSABLE) ×8 IMPLANT
GOWN STRL REUS W/TWL LRG LVL3 (GOWN DISPOSABLE) ×4
KIT BASIN OR (CUSTOM PROCEDURE TRAY) ×3 IMPLANT
KIT DILATOR VASC 18G NDL (KITS) ×6 IMPLANT
KIT DRAINAGE VACCUM ASSIST (KITS) ×3 IMPLANT
KIT ROOM TURNOVER OR (KITS) ×3 IMPLANT
KIT SUCTION CATH 14FR (SUCTIONS) ×3 IMPLANT
KIT SUT CK MINI COMBO 4X17 (Prosthesis & Implant Heart) ×3 IMPLANT
LEAD PACING MYOCARDI (MISCELLANEOUS) ×3 IMPLANT
LINE VENT (MISCELLANEOUS) ×3 IMPLANT
NEEDLE AORTIC ROOT 14G 7F (CATHETERS) ×9 IMPLANT
NS IRRIG 1000ML POUR BTL (IV SOLUTION) ×15 IMPLANT
PACK OPEN HEART (CUSTOM PROCEDURE TRAY) ×3 IMPLANT
PAD ARMBOARD 7.5X6 YLW CONV (MISCELLANEOUS) ×6 IMPLANT
PAD ELECT DEFIB RADIOL ZOLL (MISCELLANEOUS) ×3 IMPLANT
PATCH CORMATRIX 4CMX7CM (Prosthesis & Implant Heart) ×3 IMPLANT
PROBE CRYO2-ABLATION MALLABLE (MISCELLANEOUS) ×3 IMPLANT
RETRACTOR TRL SOFT TISSUE LG (INSTRUMENTS) IMPLANT
RETRACTOR TRM SOFT TISSUE 7.5 (INSTRUMENTS) IMPLANT
RING HOLDER ANNULOPLASTY (MISCELLANEOUS) ×3 IMPLANT
RING RECHORD MEMO 3D 38MM (Vascular Products) ×3 IMPLANT
RTRCTR WOUND ALEXIS 18CM MED (MISCELLANEOUS) ×3
SET CANNULATION TOURNIQUET (MISCELLANEOUS) ×3 IMPLANT
SET CARDIOPLEGIA MPS 5001102 (MISCELLANEOUS) ×3 IMPLANT
SET IRRIG TUBING LAPAROSCOPIC (IRRIGATION / IRRIGATOR) ×3 IMPLANT
SOLUTION ANTI FOG 6CC (MISCELLANEOUS) ×3 IMPLANT
SPONGE GAUZE 4X4 12PLY STER LF (GAUZE/BANDAGES/DRESSINGS) ×3 IMPLANT
SPONGE LAP 4X18 X RAY DECT (DISPOSABLE) ×6 IMPLANT
SUT BONE WAX W31G (SUTURE) ×3 IMPLANT
SUT E-PACK MINIMALLY INVASIVE (SUTURE) ×3 IMPLANT
SUT ETHIBOND (SUTURE) ×6 IMPLANT
SUT ETHIBOND 2 0 SH (SUTURE) ×9 IMPLANT
SUT ETHIBOND 2 0 V4 (SUTURE) IMPLANT
SUT ETHIBOND 2 0V4 GREEN (SUTURE) IMPLANT
SUT ETHIBOND 2-0 RB-1 WHT (SUTURE) ×6 IMPLANT
SUT ETHIBOND 4 0 TF (SUTURE) IMPLANT
SUT ETHIBOND 5 0 C 1 30 (SUTURE) IMPLANT
SUT ETHIBOND NAB MH 2-0 36IN (SUTURE) IMPLANT
SUT ETHIBOND X763 2 0 SH 1 (SUTURE) ×3 IMPLANT
SUT GORETEX 6.0 TH-9 30 IN (SUTURE) IMPLANT
SUT GORETEX CV 4 TH 22 36 (SUTURE) ×6 IMPLANT
SUT GORETEX CV-5THC-13 36IN (SUTURE) ×33 IMPLANT
SUT GORETEX CV4 TH-18 (SUTURE) ×6 IMPLANT
SUT GORETEX TH-18 36 INCH (SUTURE) IMPLANT
SUT PROLENE 3 0 SH1 36 (SUTURE) ×30 IMPLANT
SUT PROLENE 4 0 RB 1 (SUTURE) ×3
SUT PROLENE 4-0 RB1 .5 CRCL 36 (SUTURE) ×6 IMPLANT
SUT PROLENE 6 0 C 1 30 (SUTURE) ×3 IMPLANT
SUT PTFE CHORD X 24MM (SUTURE) ×3 IMPLANT
SUT SILK 2 0 SH CR/8 (SUTURE) IMPLANT
SUT SILK 3 0 SH CR/8 (SUTURE) IMPLANT
SUT VIC AB 2-0 CTX 36 (SUTURE) IMPLANT
SUT VIC AB 3-0 SH 8-18 (SUTURE) ×3 IMPLANT
SUT VICRYL 2 TP 1 (SUTURE) IMPLANT
SYRINGE 10CC LL (SYRINGE) ×3 IMPLANT
SYSTEM SAHARA CHEST DRAIN ATS (WOUND CARE) ×6 IMPLANT
TAPE CLOTH SURG 4X10 WHT LF (GAUZE/BANDAGES/DRESSINGS) ×3 IMPLANT
TOWEL OR 17X24 6PK STRL BLUE (TOWEL DISPOSABLE) ×6 IMPLANT
TOWEL OR 17X26 10 PK STRL BLUE (TOWEL DISPOSABLE) ×6 IMPLANT
TRAY FOLEY IC TEMP SENS 16FR (CATHETERS) ×3 IMPLANT
TROCAR XCEL BLADELESS 5X75MML (TROCAR) ×3 IMPLANT
TROCAR XCEL NON-BLD 11X100MML (ENDOMECHANICALS) ×6 IMPLANT
TUBE SUCT INTRACARD DLP 20F (MISCELLANEOUS) ×3 IMPLANT
TUNNELER SHEATH ON-Q 11GX8 DSP (PAIN MANAGEMENT) IMPLANT
UNDERPAD 30X30 INCONTINENT (UNDERPADS AND DIAPERS) ×3 IMPLANT
WATER STERILE IRR 1000ML POUR (IV SOLUTION) ×6 IMPLANT
WIRE BENTSON .035X145CM (WIRE) ×6 IMPLANT
YANKAUER SUCT BULB TIP NO VENT (SUCTIONS) ×3 IMPLANT

## 2015-10-22 NOTE — Interval H&P Note (Signed)
History and Physical Interval Note:  10/22/2015 8:33 AM  Colin Benton  has presented today for surgery, with the diagnosis of MR AFIB  The various methods of treatment have been discussed with the patient and family. After consideration of risks, benefits and other options for treatment, the patient has consented to  Procedure(s): MINIMALLY INVASIVE MITRAL VALVE REPAIR (MVR) (Right) MINIMALLY INVASIVE MAZE PROCEDURE (N/A) TRANSESOPHAGEAL ECHOCARDIOGRAM (TEE) (N/A) as a surgical intervention .  The patient's history has been reviewed, patient examined, no change in status, stable for surgery.  I have reviewed the patient's chart and labs.  Questions were answered to the patient's satisfaction.     Rexene Alberts

## 2015-10-22 NOTE — Brief Op Note (Addendum)
10/22/2015  2:59 PM  PATIENT:  Michael Delacruz  74 y.o. male  PRE-OPERATIVE DIAGNOSIS:  Severe MR, Persistent AFIB  POST-OPERATIVE DIAGNOSIS:  Severe MR, Persistent AFIB  PROCEDURE:   MINIMALLY INVASIVE MITRAL VALVE REPAIR   Triangular resection of posterior leaflet with sliding leafletplasty  Goretex neochords x 6  38 mm Sorin Memo 3D Rechord annuloplasty ring MINIMALLY INVASIVE MAZE PROCEDURE  Complete biatrial lesion sets  Cryothermy and radiofrequency ablation  Placement of 45 mm Atricure ProClip left atrial clip  SURGEON:    Rexene Alberts, MD  ASSISTANTS:  Suzzanne Cloud, PA-C  ANESTHESIA:   Annye Asa, MD  CROSSCLAMP TIME:   210'  CARDIOPULMONARY BYPASS TIME: 282'  FINDINGS:  Forme fruste variant of Barlow's disease  Degenerative mitral valve disease  Severe prolapse of the posterior leaflet with elongated and ruptured chordae tendinae  Type II dysfunction with severe mitral regurgitation  Mild LV systolic dysfunction with EF estimated 50%  Moderate pulmonary hypertension  Dilated left atrium  No residual mitral regurgitation after successful valve repair   Mitral Valve Etiology  MV Insufficiency: Severe  MV Disease: Yes.  MV Stenosis: No mitral valve stenosis.  MV Disease Functional Class: MV Disease Functional Class: Type II.  Etiology (Choose at least one and up to five): Degenerative.  MV Lesions (Choose at least one): Leaflet prolapse, posterior. and Elongated/ruptured chords(s).    Mitral Valve Procedure  Mitral Valve Procedure Performed:  Repair: Annuloplasty., Leaflet Resection. Resection Type:Triangular. Mitral Leaflet Resection Location: Posterior., Sliding Plasty. and Neochrods. Number of Neochords Inserted: 6.  Implant: Annuloplasty Device: Implant model number [id number], Size 38, Unique Device Identifier [id number].  Maze Procedure  Surgical Approach: Right mini thoracotomy  Cut-and-sew:  No.  Cryo: Yes  Cryo Lesions  (select all that apply):        2   Box Lesion,     4  Posterior Mirtal Annular Line,     6  Mitral Valve Cryo Lesion,     9   Intercaval Line to Tricuspid Annulus ("T" Lesion) and    10  Tricuspid Cryo Lesion, Medial   16  Other - epicardial posterior AV groove and coronary sinus    Radiofrequency:  Yes.  Bipolar: Yes.  RF Lesions (select all that apply):     9   Intercaval Line to Tricuspid Annulus ("T" Lesion),    11  Intercaval Line and   15b  RAA Lateral Wall to "T" Lesion     Left Atrial Appendage Treatment:    Yes -  epicardial clip   COMPLICATIONS: None  BASELINE WEIGHT: 92 kg  PATIENT DISPOSITION:   TO SICU IN STABLE CONDITION  Rexene Alberts, MD 10/22/2015 5:15 PM

## 2015-10-22 NOTE — Progress Notes (Addendum)
[facility] DAILY ICU PROGRESS NOTE                   [address]            [place] [address]          [phone]   Day of Surgery Procedure(s) (LRB): MINIMALLY INVASIVE MITRAL VALVE REPAIR (MVR) (Right) MINIMALLY INVASIVE MAZE PROCEDURE (N/A) TRANSESOPHAGEAL ECHOCARDIOGRAM (TEE) (N/A)  Total Length of Stay:  LOS: 0 days   Subjective: Patient sedated, still intubated  Objective: Vital signs in last 24 hours: Temp:  [95.4 F (35.2 C)-97.5 F (36.4 C)] 96.4 F (35.8 C) (11/02 2115) Pulse Rate:  [51-80] 80 (11/02 2115) Cardiac Rhythm:  [-] Atrial paced (11/02 2000) Resp:  [10-23] 18 (11/02 2115) BP: (92-138)/(68-75) 95/71 mmHg (11/02 2100) SpO2:  [93 %-100 %] 97 % (11/02 2115) Arterial Line BP: (76-115)/(52-70) 107/66 mmHg (11/02 2115) FiO2 (%):  [50 %-60 %] 60 % (11/02 2000) Weight:  [202 lb (91.627 kg)] 202 lb (91.627 kg) (11/02 0726)  Filed Weights   10/22/15 0726  Weight: 202 lb (91.627 kg)     Hemodynamic parameters for last 24 hours: PAP: (18-59)/(2-27) 33/23 mmHg CO:  [4.3 L/min-4.8 L/min] 4.8 L/min CI:  [2 L/min/m2-2.2 L/min/m2] 2.2 L/min/m2     Intake/Output this shift: Total I/O In: 424.3 [I.V.:394.3; NG/GT:30] Out: 255 [Urine:185; Chest Tube:70]  Current Meds: Scheduled Meds: . [START ON 10/23/2015] acetaminophen  1,000 mg Oral 4 times per day   Or  . [START ON 10/23/2015] acetaminophen (TYLENOL) oral liquid 160 mg/5 mL  1,000 mg Per Tube 4 times per day  . [START ON 10/23/2015] aspirin EC  325 mg Oral Daily   Or  . [START ON 10/23/2015] aspirin  324 mg Per Tube Daily  . [START ON 10/23/2015] bisacodyl  10 mg Oral Daily   Or  . [START ON 10/23/2015] bisacodyl  10 mg Rectal Daily  . [START ON 10/23/2015] cefUROXime (ZINACEF)  IV  1.5 g Intravenous Q12H  . [START ON 10/23/2015] docusate sodium  200 mg Oral Daily  . famotidine (PEPCID) IV  20 mg Intravenous Q12H  . [START ON 10/23/2015] insulin regular  0-10 Units Intravenous TID WC  .  ipratropium-albuterol  3 mL Nebulization Q6H  . magnesium sulfate  4 g Intravenous Once  . metoprolol tartrate  12.5 mg Oral BID   Or  . metoprolol tartrate  12.5 mg Per Tube BID  . [START ON 10/24/2015] pantoprazole  40 mg Oral Daily  . [START ON 10/23/2015] sodium chloride  3 mL Intravenous Q12H  . [START ON 10/23/2015] vancomycin  1,000 mg Intravenous Once   Continuous Infusions: . sodium chloride 20 mL/hr at 10/22/15 2000  . [START ON 10/23/2015] sodium chloride    . sodium chloride 20 mL/hr at 10/22/15 2000  . sodium chloride 100 mL/hr at 10/22/15 2000  . dexmedetomidine 0.7 mcg/kg/hr (10/22/15 2000)  . insulin (NOVOLIN-R) infusion 0.9 mL/hr at 10/22/15 2100  . lactated ringers    . lactated ringers 20 mL/hr at 10/22/15 2000  . milrinone 0.2 mcg/kg/min (10/22/15 2000)  . nitroGLYCERIN Stopped (10/22/15 1820)  . phenylephrine (NEO-SYNEPHRINE) Adult infusion 20 mcg/min (10/22/15 2000)   PRN Meds:.sodium chloride, albumin human, fentaNYL (SUBLIMAZE) injection, lactated ringers, metoprolol, midazolam, ondansetron (ZOFRAN) IV, oxyCODONE, [START ON 10/23/2015] sodium chloride, traMADol  General appearance: Sedated, intubated Heart: Paced, RRR Lungs: Coarse breath sounds bilaterally Extremities: Trace LE edema Wound: Dressing is clean and dry  Lab Results: CBC:  Recent Labs  10/20/15 1322  10/22/15 1420  10/22/15 1810 10/22/15 1811  WBC 5.8  --   --   --  11.6*  --   HGB 14.3  < > 9.4*  < > 12.0* 11.6*  HCT 41.3  < > 28.0*  < > 35.7* 34.0*  PLT 144*  --  92*  --  99*  --   < > = values in this interval not displayed. BMET:  Recent Labs  10/20/15 1322  10/22/15 1506 10/22/15 1604 10/22/15 1653 10/22/15 1811  NA 141  < > 138 141  --  140  K 3.7  < > 5.3* 4.2  --  4.2  CL 108  < > 106 105  --   --   CO2 23  --   --   --   --   --   GLUCOSE 122*  < > 125* 107* 101* 93  BUN 10  < > 7 7  --   --   CREATININE 1.15  < > 0.70 0.90  --   --   CALCIUM 9.4  --   --   --   --    --   < > = values in this interval not displayed.  PT/INR:  Recent Labs  10/22/15 1810  LABPROT 16.1*  INR 1.27   Radiology: Dg Chest Port 1 View  10/22/2015  CLINICAL DATA:  Postop from mitral valve replacement. COPD. Pulmonary hypertension. EXAM: PORTABLE CHEST 1 VIEW COMPARISON:  10/20/2015 FINDINGS: Endotracheal tube and nasogastric tube are seen in appropriate position. Swan-Ganz catheter is seen with tip in the right pulmonary artery. Two right-sided chest tubes are seen in place and no pneumothorax visualized. Mild to moderate cardiomegaly is noted. Diffuse interstitial edema pattern is demonstrated. No focal area pulmonary consolidation demonstrated. IMPRESSION: Postop chest. Diffuse pulmonary interstitial edema. No pneumothorax visualized. Electronically Signed   By: Earle Gell M.D.   On: 10/22/2015 18:25    Assessment/Plan: S/P Procedure(s) (LRB): MINIMALLY INVASIVE MITRAL VALVE REPAIR (MVR) (Right) MINIMALLY INVASIVE MAZE PROCEDURE (N/A) TRANSESOPHAGEAL ECHOCARDIOGRAM (TEE) (N/A)  1.CV-Paced at 38. On Milrinone and Phenylephrine drips  .CO 4.8 and CI 2.2 2. Pulmonary-Chest tubes with 150 cc since surgery. Hopefully, will gradually wean to extubate 3. ABL anemia-H and H 11.6 and 34 4. Continue routine post op management    Arnoldo Lenis 10/22/2015 9:29 PM  Will keep intubated due to oxygenation issues postop patient examined and medical record reviewed,agree with above note. Tharon Aquas Trigt III 10/23/2015

## 2015-10-22 NOTE — Op Note (Addendum)
CARDIOTHORACIC SURGERY OPERATIVE NOTE  Date of Procedure:   10/22/2015  Preoperative Diagnosis:    Severe Mitral Regurgitation  Persistent Atrial Fibrillation  Postoperative Diagnosis: Same  Procedure:    Minimally-Invasive Mitral Valve Repair  Complex valvuloplasty including triangular resection of posterior leaflet  Artificial Gore-tex neochord placement x6  Sorin Memo 3D Rechord Ring Annuloplasty (size 87mm, catalog [id number], serial [id number])   Minimally-Invasive Maze Procedure  Complete bilateral atrial lesion set using cryothermy and bipolar radiofrequency ablation  Clipping of Left Atrial Appendage (Atricure left atrial clip, size 45 mm)  Surgeon: Valentina Gu. Roxy Manns, MD  Assistant: Arlyn Leak. Theda Sers, PA-C  Anesthesia: Midge Minium, MD  Operative Findings:  Forme fruste variant of Barlow's disease  Degenerative mitral valve disease  Severe prolapse of the posterior leaflet with elongated and ruptured chordae tendinae  Type II dysfunction with severe mitral regurgitation  Mild LV systolic dysfunction with EF estimated 50%  Moderate pulmonary hypertension  Dilated left atrium  No residual mitral regurgitation after successful valve repair                        BRIEF CLINICAL NOTE AND INDICATIONS FOR SURGERY  Patient is a 74 year old male with known history of mitral valve prolapse and severe mitral regurgitation, long-standing tobacco abuse with COPD, chronic dyspnea on exertion, and recently diagnosed persistent atrial fibrillation for which he underwent DC cardioversion this been referred for surgical consultation to discuss treatment options for management of mitral valve prolapse with mitral regurgitation and atrial fibrillation. The patient states that he was told he had rheumatic fever as a child and he has had a heart murmur for most of his adult life. He has been followed for the last several years by Dr. [name] with known history  of mitral valve prolapse and severe mitral regurgitation. In 2012 he was referred to [facility] for possible elective mitral valve repair, but the patient declined recommendations to proceed with elective surgery at that time. Over the past year he has developed further progression in symptoms of exertional shortness of breath, palpitations, and atypical chest pain. He was hospitalized at [facility] on 08/15/2015 with new onset persistent atrial fibrillation. Follow-up transthoracic echocardiogram confirmed the presence of mitral valve prolapse with severe mitral regurgitation and preserved left ventricular systolic function. He was started on Eliquis for anticoagulation and underwent TEE guided DC cardioversion on 08/18/2015. He was seen in follow-up by Dr. [name] on 09/15/2015 at which time he was maintaining sinus rhythm. The patient was referred for surgical consultation.  The patient has been seen in consultation and counseled at length regarding the indications, risks and potential benefits of surgery.  All questions have been answered, and the patient provides full informed consent for the operation as described.    DETAILS OF THE OPERATIVE PROCEDURE  Preparation:  The patient is brought to the operating room on the above mentioned date and central monitoring was established by the anesthesia team including placement of Swan-Ganz catheter through the left internal jugular vein.  There was moderate pulmonary hypertension. A radial arterial line is placed. The patient is placed in the supine position on the operating table.  Intravenous antibiotics are administered. General endotracheal anesthesia is induced uneventfully. The patient is initially intubated using a dual lumen endotracheal tube.  A Foley catheter is placed.  Baseline transesophageal echocardiogram was performed.  Findings were notable for Degenerative mitral valve disease with severe prolapse involving a large  redundant middle scallop (P2)  of the posterior leaflet with ruptured chordae tendineae. There was a large jet of regurgitation coursing anteriorly around the left atrium with severe (4+)  Mitral regurgitation. The left atrium was dilated. There was mild left ventricular systolic dysfunction with ejection fraction estimated 50%. Right ventricular size and function was normal. There was trivial tricuspid regurgitation.  A soft roll is placed behind the patient's left scapula and the neck gently extended and turned to the left.   The patient's right neck, chest, abdomen, both groins, and both lower extremities are prepared and draped in a sterile manner. A time out procedure is performed.   Surgical Approach:  A right miniature anterolateral thoracotomy incision is performed. The incision is placed just lateral to and superior to the right nipple. The pectoralis major muscle is retracted medially and completely preserved. The right pleural space is entered through the 3rd intercostal space. A soft tissue retractor is placed.  Two 11 mm ports are placed through separate stab incisions inferiorly. The right pleural space is insufflated continuously with carbon dioxide gas through the posterior port during the remainder of the operation.  A pledgeted sutures placed through the dome of the right hemidiaphragm and retracted inferiorly to facilitate exposure.  A longitudinal incision is made in the pericardium 3 cm anterior to the phrenic nerve and silk traction sutures are placed on either side of the incision for exposure.   Extracorporeal Cardiopulmonary Bypass and Myocardial Protection:  A small incision is made in the right inguinal crease and the anterior surface of the right common femoral artery and right common femoral vein are identified.  The patient is placed in Trendelenburg position. The right internal jugular vein is cannulated with Seldinger technique and a guidewire advanced into the right  atrium. The patient is heparinized systemically. The right internal jugular vein is cannulated with a 14 [place] pediatric femoral venous cannula. Pursestring sutures are placed on the anterior surface of the right common femoral vein and right common femoral artery. The right common femoral vein is cannulated with the Seldinger technique and a guidewire is advanced under transesophageal echocardiogram guidance through the right atrium. The femoral vein is cannulated with a long 22 French femoral venous cannula. The right common femoral artery is cannulated with Seldinger technique and a flexible guidewire is advanced until it can be appreciated intraluminally in the descending thoracic aorta on transesophageal echocardiogram. The femoral artery is cannulated with an 18 French femoral arterial cannula.  Adequate heparinization is verified.      The entire pre-bypass portion of the operation was notable for stable hemodynamics.  Cardiopulmonary bypass was begun.  Vacuum assist venous drainage is utilized. The incision in the pericardium is extended in both directions. Venous drainage and exposure are notably excellent.   A retrograde cardioplegia cannula is placed through the right atrium into the coronary sinus using transesophageal echocardiogram guidance.  An antegrade cardioplegia cannula is placed in the ascending aorta.  The patient is cooled to 28C systemic temperature.  The aortic cross clamp is applied and cold blood cardioplegia is delivered initially in an antegrade fashion through the aortic root.   Supplemental cardioplegia is given retrograde through the coronary sinus catheter. The initial cardioplegic arrest is rapid with early diastolic arrest.  Repeat doses of cardioplegia are administered intermittently every 20 to 30 minutes throughout the entire cross clamp portion of the operation through the aortic root and through the coronary sinus catheter in order to maintain completely flat  electrocardiogram.  Myocardial protection was felt  to be excellent.   Maze Procedure (left atrial lesion set):  The left atrial appendage is obliterated using an Atricure left atrial appendage clip (Atriclip, size 45 mm).  The clip is applied under thoracoscopic visualization posterior to the aorta and pulmonary artery through the oblique sinus.  The clip was applied prior to application of the aortic crossclamp, with transesophageal echocardiographic confirmation that the clip satisfactorily obliterates the appendage.  Following placement of the aortic crossclamp and the administration of the initial arresting dose of cardioplegia, a left atriotomy incision was performed through the interatrial groove and extended partially across the back wall of the left atrium after opening the oblique sinus inferiorly.  The mitral valve and floor of the left atrium are exposed using a self-retaining retractor.    The Atricure CryoICE nitrous oxide cryothermy system is utilized for all cryothermy ablation lesions.  The left atrial lesion set of the Cox cryomaze procedure is now performed using 3 minute duration for all cryothermy lesions.  Initially a lesion is placed along the endocardial surface of the left atrium from the caudad apex of the atriotomy incision across the posterior wall of the left atrium onto the posterior mitral annulus.  A mirror image lesion along the epicardial surface is then performed with the probe posterior to the left atrium, crossing over the coronary sinus.  Two lesions are then performed to create a box isolating all of the pulmonary veins from the remainder of the left atrium.  The first lesion is placed from the cephalad apex of the atriotomy incision across the dome of the left atrium to just anterior to the left sided pulmonary veins.  The second lesion completes the box from the caudad apex of the atriotomy incision across the back wall of the left atrium to connect with the previous  lesion just anterior to the left sided pulmonary veins.     Mitral Valve Repair:  The mitral valve was inspected and notable for forme fruste variant of Barlow's disease.  The mitral valve was large with a very large redundant middle scallop of the posterior leaflet (P2). There was a single ruptured primary chordae tendinae along the free margin of P2. All of the other chordae tendinae to P2 were elongated.  There was severe calcification throughout the entire posterior mitral annulus, particularly along P1 and P2. Despite this there was normal leaflet mobility of P1 and P3. There was somewhat restricted leaflet mobility of P2. The anterior mitral leaflet was mildly sclerotic but otherwise normal.  Interrupted 2-0 Ethibond horizontal mattress sutures are placed circumferentially around the entire mitral valve annulus. The sutures will ultimately be utilized for ring annuloplasty, and at this juncture there are utilized to suspend the valve symmetrically.  The height of the chordae tendineae to the free margin of the posterior leaflet were measured.  A pledgeted multistrand Chord-X CV 4 Gore-Tex suture is placed through the head of the posterior papillary muscle in a horizontal mattress fashion and the sutures tied. The 3 premeasured loops of this suture are gathered in organized fashion for later retrieval to be utilized for artificial Gore-Tex neo-chord placement.  The calcified midportion of the flail segment of P2 is repaired using a simple triangular resection. The intervening vertical defect in P2 was closed using interrupted everting simple CV 5 Gore-Tex suture. The 3 premeasured Gore-Tex loops are now reimplanted into the free margin of P2 to resuspend the entire middle scallop using 6 artificial Gore-Tex neochords.  The valve is tested with saline  and appears reasonably competent even prior to ring annuloplasty.  The indentation between P1 and P2 is closed using a pair of everting 4-0 Prolene  sutures.  The valve is sized to accept a 38 mm annuloplasty ring based upon the distance between the left and right commissures, the height and the surface area of the anterior leaflet.  A Sorin Memo 3D Rechord annuloplasty ring (size 74mm, catalog # [id number], serial # [id number]) is implanted uneventfully.  All ring sutures were secured using a Cor-knot device.  The valve is again tested with saline and appears to be perfectly competent with a broad symmetrical line of coaptation of the anterior and posterior leaflet. There is no residual leak. Rewarming is begun.  The atriotomy was closed using a 2-layer closure of running 3-0 Prolene suture after placing a sump drain across the mitral valve to serve as a left ventricular vent.  One final dose of warm retrograde "hot shot" cardioplegia was administered retrograde through the coronary sinus catheter while all air was evacuated through the aortic root.  The aortic cross clamp was removed after a total cross clamp time of 210 minutes.   Maze Procedure (right atrial lesion set):  The retrograde cardioplegia cannula was removed and the small hole in the right atrium extended a short distance.  The AtriCure Synergy bipolar radiofrequency ablation clamp is utilized to create a series of linear lesions in the right atrium, each with one limb of the clamp along the endocardial surface and the other along the epicardial surface. The first lesion is placed from the posterior apex of the atriotomy incision and along the lateral wall of the right atrium to reach the lateral aspect of the superior vena cava. A second lesion is placed in the opposite direction from the posterior apex of the atriotomy incision along the lateral wall to reach the lateral aspect of the inferior vena cava. A third lesion is placed from the midportion of the atriotomy incision extending at a right angle to reach the tip of the right atrial appendage. A fourth lesion is placed from the anterior  apex of the atriotomy incision in an anterior and inferior direction to reach the acute margin of the heart. Finally, the cryotherapy probe is utilized to complete the right atrial lesion set by placing the probe along the endocardial surface of the right atrium from the anterior apex of the atriotomy incision to reach the tricuspid annulus at the 2:00 position. The atriotomy incision is closed with a 2 layer closure of running 4-0 Prolene suture.   Procedure Completion:  Epicardial pacing wires are fixed to the inferior wall of the right ventricule and to the right atrial appendage. The patient is rewarmed to 37C temperature. The left ventricular vent is removed.  The patient is ventilated and flow volumes turndown while the mitral valve repair is inspected using transesophageal echocardiogram. The valve repair appears intact with no residual leak. The antegrade cardioplegia cannula is now removed. The patient is weaned and disconnected from cardiopulmonary bypass.  The patient's rhythm at separation from bypass was AV paced.  The patient was weaned from bypass on low dose milrinone infusion. Total cardiopulmonary bypass time for the operation was 282 minutes.  Followup transesophageal echocardiogram performed after separation from bypass revealed  a well-seated annuloplasty ring in the mitral position with a normal functioning mitral valve. There was no residual leak.  Left ventricular function was unchanged from preoperatively.  The femoral arterial and venous cannulae were removed uneventfully. There was  a palpable pulse in the distal right common femoral artery after removal of the cannula. Protamine was administered to reverse the anticoagulation. The right internal jugular cannula was removed and manual pressure held on the neck for 15 minutes.  Single lung ventilation was begun. The atriotomy closure was inspected for hemostasis. The pericardial sac was drained using a 28 French Bard drain placed  through the anterior port incision.  The pericardium was closed using a patch of core matrix bovine submucosal tissue patch. The right pleural space is irrigated with saline solution and inspected for hemostasis. The right pleural space was drained using a 28 French Bard drain placed through the posterior port incision. The miniature thoracotomy incision was closed in multiple layers in routine fashion. The right groin incision was inspected for hemostasis and closed in multiple layers in routine fashion.  The post-bypass portion of the operation was notable for stable rhythm and hemodynamics.  No blood products were administered during the operation.   Disposition:  The patient tolerated the procedure well.  The patient was reintubated using a single lumen endotracheal tube and subsequently transported to the surgical intensive care unit in stable condition. There were no intraoperative complications. All sponge instrument and needle counts are verified correct at completion of the operation.    Valentina Gu. Roxy Manns MD 10/22/2015 5:24 PM

## 2015-10-22 NOTE — OR Nursing (Signed)
On-way call to SICU at 1752

## 2015-10-22 NOTE — OR Nursing (Signed)
Off-pump call to SICU at 1540. Skin closure call to SICU at 1705.

## 2015-10-22 NOTE — Anesthesia Procedure Notes (Signed)
Anesthesia Procedure Note Central line insertion note. Skin prepped and draped in sterile fashion. Patent vessel identified on u/s using linear probe. Needle advanced under live u/s guidance with aspiration of blood upon entry into vessel. Catheter passed easily over finder needle. Wire passed easily through catheter and location confirmed with u/s. Image saved in chart. Introducer catheter advanced over wire, with aspiration of blood through all ports for confirmation. Line sutured and dressing applied. Pt tolerated well with no immediate complications.  R. Zowie Lundahl, MD     

## 2015-10-22 NOTE — Transfer of Care (Signed)
Immediate Anesthesia Transfer of Care Note  Patient: Michael Delacruz  Procedure(s) Performed: Procedure(s): MINIMALLY INVASIVE MITRAL VALVE REPAIR (MVR) (Right) MINIMALLY INVASIVE MAZE PROCEDURE (N/A) TRANSESOPHAGEAL ECHOCARDIOGRAM (TEE) (N/A)  Patient Location: SICU  Anesthesia Type:General  Level of Consciousness: sedated, unresponsive and Patient remains intubated per anesthesia plan  Airway & Oxygen Therapy: Patient remains intubated per anesthesia plan and Patient placed on Ventilator (see vital sign flow sheet for setting)  Post-op Assessment: Report given to RN and Post -op Vital signs reviewed and stable  Post vital signs: Reviewed and stable  Last Vitals:  Filed Vitals:   10/22/15 1802  BP: 92/68  Pulse: 80  Temp:   Resp: 12    Complications: No apparent anesthesia complications

## 2015-10-22 NOTE — Progress Notes (Signed)
  Echocardiogram Echocardiogram Transesophageal has been performed.  Michael Delacruz 10/22/2015, 9:52 AM

## 2015-10-22 NOTE — Progress Notes (Signed)
Beta blocker held this am. 138/69, pulse 54.  Pt. stated he has chest pain last night and lasted 5 min. States it comes and goes. No chest pain today.

## 2015-10-22 NOTE — Anesthesia Postprocedure Evaluation (Signed)
  Anesthesia Post-op Note  Patient: Michael Delacruz  Procedure(s) Performed: Procedure(s): MINIMALLY INVASIVE MITRAL VALVE REPAIR (MVR) (Right) MINIMALLY INVASIVE MAZE PROCEDURE (N/A) TRANSESOPHAGEAL ECHOCARDIOGRAM (TEE) (N/A)  Patient Location: SICU  Anesthesia Type:General  Level of Consciousness: sedated, unresponsive and Patient remains intubated per anesthesia plan  Airway and Oxygen Therapy: Patient remains intubated per anesthesia plan and Patient placed on Ventilator (see vital sign flow sheet for setting)  Post-op Pain: none  Post-op Assessment: Post-op Vital signs reviewed, Patient's Cardiovascular Status Stable, Respiratory Function Stable, Patent Airway, No signs of Nausea or vomiting, Adequate PO intake, Pain level controlled, No headache and No backache              Post-op Vital Signs: Reviewed and stable  Last Vitals:  Filed Vitals:   10/22/15 1802  BP: 92/68  Pulse: 80  Temp:   Resp: 12    Complications: No apparent anesthesia complications

## 2015-10-23 ENCOUNTER — Encounter (HOSPITAL_COMMUNITY): Payer: Self-pay | Admitting: Thoracic Surgery (Cardiothoracic Vascular Surgery)

## 2015-10-23 ENCOUNTER — Inpatient Hospital Stay (HOSPITAL_COMMUNITY): Payer: Medicare HMO

## 2015-10-23 LAB — CBC
HCT: 34.1 % — ABNORMAL LOW (ref 39.0–52.0)
HCT: 32.6 % — ABNORMAL LOW (ref 39.0–52.0)
HCT: 32.9 % — ABNORMAL LOW (ref 39.0–52.0)
Hemoglobin: 11.8 g/dL — ABNORMAL LOW (ref 13.0–17.0)
Hemoglobin: 11 g/dL — ABNORMAL LOW (ref 13.0–17.0)
Hemoglobin: 11 g/dL — ABNORMAL LOW (ref 13.0–17.0)
MCH: 32 pg (ref 26.0–34.0)
MCH: 30.9 pg (ref 26.0–34.0)
MCH: 31.1 pg (ref 26.0–34.0)
MCHC: 34.6 g/dL (ref 30.0–36.0)
MCHC: 33.7 g/dL (ref 30.0–36.0)
MCHC: 33.4 g/dL (ref 30.0–36.0)
MCV: 92.4 fL (ref 78.0–100.0)
MCV: 91.6 fL (ref 78.0–100.0)
MCV: 92.9 fL (ref 78.0–100.0)
Platelets: 84 10*3/uL — ABNORMAL LOW (ref 150–400)
Platelets: 80 10*3/uL — ABNORMAL LOW (ref 150–400)
Platelets: 81 10*3/uL — ABNORMAL LOW (ref 150–400)
RBC: 3.69 MIL/uL — ABNORMAL LOW (ref 4.22–5.81)
RBC: 3.56 MIL/uL — ABNORMAL LOW (ref 4.22–5.81)
RBC: 3.54 MIL/uL — ABNORMAL LOW (ref 4.22–5.81)
RDW: 13.3 % (ref 11.5–15.5)
RDW: 13.2 % (ref 11.5–15.5)
RDW: 13.7 % (ref 11.5–15.5)
WBC: 13.6 10*3/uL — ABNORMAL HIGH (ref 4.0–10.5)
WBC: 11.7 10*3/uL — ABNORMAL HIGH (ref 4.0–10.5)
WBC: 11.2 10*3/uL — ABNORMAL HIGH (ref 4.0–10.5)

## 2015-10-23 LAB — POCT I-STAT 3, ART BLOOD GAS (G3+)
Acid-base deficit: 6 mmol/L — ABNORMAL HIGH (ref 0.0–2.0)
Acid-base deficit: 7 mmol/L — ABNORMAL HIGH (ref 0.0–2.0)
Acid-base deficit: 7 mmol/L — ABNORMAL HIGH (ref 0.0–2.0)
Acid-base deficit: 7 mmol/L — ABNORMAL HIGH (ref 0.0–2.0)
Bicarbonate: 17.8 mEq/L — ABNORMAL LOW (ref 20.0–24.0)
Bicarbonate: 18.2 mEq/L — ABNORMAL LOW (ref 20.0–24.0)
Bicarbonate: 18.8 mEq/L — ABNORMAL LOW (ref 20.0–24.0)
Bicarbonate: 18.9 mEq/L — ABNORMAL LOW (ref 20.0–24.0)
O2 Saturation: 83 %
O2 Saturation: 89 %
O2 Saturation: 92 %
O2 Saturation: 96 %
Patient temperature: 36.1
Patient temperature: 36.1
Patient temperature: 36.3
Patient temperature: 36.4
TCO2: 19 mmol/L (ref 0–100)
TCO2: 19 mmol/L (ref 0–100)
TCO2: 20 mmol/L (ref 0–100)
TCO2: 20 mmol/L (ref 0–100)
pCO2 arterial: 32.2 mmHg — ABNORMAL LOW (ref 35.0–45.0)
pCO2 arterial: 34.6 mmHg — ABNORMAL LOW (ref 35.0–45.0)
pCO2 arterial: 34.6 mmHg — ABNORMAL LOW (ref 35.0–45.0)
pCO2 arterial: 34.7 mmHg — ABNORMAL LOW (ref 35.0–45.0)
pH, Arterial: 7.324 — ABNORMAL LOW (ref 7.350–7.450)
pH, Arterial: 7.338 — ABNORMAL LOW (ref 7.350–7.450)
pH, Arterial: 7.342 — ABNORMAL LOW (ref 7.350–7.450)
pH, Arterial: 7.346 — ABNORMAL LOW (ref 7.350–7.450)
pO2, Arterial: 48 mmHg — ABNORMAL LOW (ref 80.0–100.0)
pO2, Arterial: 56 mmHg — ABNORMAL LOW (ref 80.0–100.0)
pO2, Arterial: 65 mmHg — ABNORMAL LOW (ref 80.0–100.0)
pO2, Arterial: 83 mmHg (ref 80.0–100.0)

## 2015-10-23 LAB — GLUCOSE, CAPILLARY
Glucose-Capillary: 105 mg/dL — ABNORMAL HIGH (ref 65–99)
Glucose-Capillary: 106 mg/dL — ABNORMAL HIGH (ref 65–99)
Glucose-Capillary: 119 mg/dL — ABNORMAL HIGH (ref 65–99)
Glucose-Capillary: 121 mg/dL — ABNORMAL HIGH (ref 65–99)
Glucose-Capillary: 134 mg/dL — ABNORMAL HIGH (ref 65–99)
Glucose-Capillary: 145 mg/dL — ABNORMAL HIGH (ref 65–99)
Glucose-Capillary: 164 mg/dL — ABNORMAL HIGH (ref 65–99)

## 2015-10-23 LAB — POCT I-STAT, CHEM 8
BUN: 11 mg/dL (ref 6–20)
BUN: 7 mg/dL (ref 6–20)
Calcium, Ion: 1.1 mmol/L — ABNORMAL LOW (ref 1.13–1.30)
Calcium, Ion: 1.12 mmol/L — ABNORMAL LOW (ref 1.13–1.30)
Chloride: 106 mmol/L (ref 101–111)
Chloride: 107 mmol/L (ref 101–111)
Creatinine, Ser: 0.8 mg/dL (ref 0.61–1.24)
Creatinine, Ser: 1 mg/dL (ref 0.61–1.24)
Glucose, Bld: 108 mg/dL — ABNORMAL HIGH (ref 65–99)
Glucose, Bld: 151 mg/dL — ABNORMAL HIGH (ref 65–99)
HCT: 33 % — ABNORMAL LOW (ref 39.0–52.0)
HCT: 34 % — ABNORMAL LOW (ref 39.0–52.0)
Hemoglobin: 11.2 g/dL — ABNORMAL LOW (ref 13.0–17.0)
Hemoglobin: 11.6 g/dL — ABNORMAL LOW (ref 13.0–17.0)
Potassium: 3.8 mmol/L (ref 3.5–5.1)
Potassium: 3.9 mmol/L (ref 3.5–5.1)
Sodium: 138 mmol/L (ref 135–145)
Sodium: 141 mmol/L (ref 135–145)
TCO2: 18 mmol/L (ref 0–100)
TCO2: 18 mmol/L (ref 0–100)

## 2015-10-23 LAB — MAGNESIUM
Magnesium: 2.7 mg/dL — ABNORMAL HIGH (ref 1.7–2.4)
Magnesium: 2.5 mg/dL — ABNORMAL HIGH (ref 1.7–2.4)
Magnesium: 2.4 mg/dL (ref 1.7–2.4)

## 2015-10-23 LAB — CREATININE, SERUM
Creatinine, Ser: 0.91 mg/dL (ref 0.61–1.24)
Creatinine, Ser: 1.08 mg/dL (ref 0.61–1.24)
GFR calc Af Amer: >60 mL/min (ref >60)
GFR calc Af Amer: >60 mL/min (ref >60)
GFR calc non Af Amer: >60 mL/min (ref >60)
GFR calc non Af Amer: >60 mL/min (ref >60)

## 2015-10-23 LAB — BASIC METABOLIC PANEL
Anion gap: 5 (ref 5–15)
BUN: 9 mg/dL (ref 6–20)
CO2: 19 mmol/L — ABNORMAL LOW (ref 22–32)
Calcium: 7.3 mg/dL — ABNORMAL LOW (ref 8.9–10.3)
Chloride: 114 mmol/L — ABNORMAL HIGH (ref 101–111)
Creatinine, Ser: 0.99 mg/dL (ref 0.61–1.24)
GFR calc Af Amer: >60 mL/min (ref >60)
GFR calc non Af Amer: >60 mL/min (ref >60)
Glucose, Bld: 144 mg/dL — ABNORMAL HIGH (ref 65–99)
Potassium: 3.7 mmol/L (ref 3.5–5.1)
Sodium: 138 mmol/L (ref 135–145)

## 2015-10-23 MED ORDER — WARFARIN - PHYSICIAN DOSING INPATIENT
Freq: Every day | Status: DC
  Administered 2015-10-28: 19:00:00

## 2015-10-23 MED ORDER — CHLORHEXIDINE GLUCONATE 0.12% ORAL RINSE (MEDLINE KIT)
15.0000 mL | Freq: Two times a day (BID) | OROMUCOSAL | Status: DC
  Administered 2015-10-23: 15 mL via OROMUCOSAL

## 2015-10-23 MED ORDER — MILRINONE IN DEXTROSE 20 MG/100ML IV SOLN: 0.0000 ug/kg/min | INTRAVENOUS | Status: DC

## 2015-10-23 MED ORDER — FUROSEMIDE 10 MG/ML IJ SOLN
20.0000 mg | Freq: Four times a day (QID) | INTRAMUSCULAR | Status: AC
  Administered 2015-10-23 (×3): 20 mg via INTRAVENOUS
  Filled 2015-10-23 (×3): qty 2

## 2015-10-23 MED ORDER — POTASSIUM CHLORIDE 10 MEQ/50ML IV SOLN
10.0000 meq | INTRAVENOUS | Status: AC
  Administered 2015-10-23 (×3): 10 meq via INTRAVENOUS

## 2015-10-23 MED ORDER — POTASSIUM CHLORIDE 10 MEQ/50ML IV SOLN
10.0000 meq | INTRAVENOUS | Status: AC
  Administered 2015-10-23 (×2): 10 meq via INTRAVENOUS

## 2015-10-23 MED ORDER — SODIUM BICARBONATE 8.4 % IV SOLN
50.0000 meq | Freq: Once | INTRAVENOUS | Status: AC
  Administered 2015-10-23: 50 meq via INTRAVENOUS

## 2015-10-23 MED ORDER — WARFARIN SODIUM 2.5 MG PO TABS
2.5000 mg | ORAL_TABLET | Freq: Every day | ORAL | Status: DC
  Administered 2015-10-23 – 2015-10-25 (×3): 2.5 mg via ORAL
  Filled 2015-10-23 (×5): qty 1

## 2015-10-23 MED ORDER — SODIUM BICARBONATE 4.2 % IV SOLN: 50.0000 meq | Freq: Once | INTRAVENOUS | Status: DC

## 2015-10-23 MED ORDER — ANTISEPTIC ORAL RINSE SOLUTION (CORINZ)
7.0000 mL | Freq: Four times a day (QID) | OROMUCOSAL | Status: DC
  Administered 2015-10-23 (×2): 7 mL via OROMUCOSAL

## 2015-10-23 MED ORDER — CETYLPYRIDINIUM CHLORIDE 0.05 % MT LIQD
7.0000 mL | Freq: Two times a day (BID) | OROMUCOSAL | Status: DC
  Administered 2015-10-23 – 2015-10-28 (×8): 7 mL via OROMUCOSAL

## 2015-10-23 MED FILL — Heparin Sodium (Porcine) Inj 1000 Unit/ML: INTRAMUSCULAR | Qty: 20 | Status: AC

## 2015-10-23 MED FILL — Sodium Bicarbonate IV Soln 8.4%: INTRAVENOUS | Qty: 50 | Status: AC

## 2015-10-23 MED FILL — Mannitol IV Soln 20%: INTRAVENOUS | Qty: 500 | Status: AC

## 2015-10-23 MED FILL — Electrolyte-R (PH 7.4) Solution: INTRAVENOUS | Qty: 3000 | Status: AC

## 2015-10-23 MED FILL — Lidocaine HCl IV Inj 20 MG/ML: INTRAVENOUS | Qty: 5 | Status: AC

## 2015-10-23 MED FILL — Sodium Chloride IV Soln 0.9%: INTRAVENOUS | Qty: 2000 | Status: AC

## 2015-10-23 NOTE — Progress Notes (Signed)
Patient ID: Michael Delacruz, male   DOB: 09-19-1941, 74 y.o.   MRN: [id number] EVENING ROUNDS NOTE :     [address]       [place] [address]             [phone]                 1 Day Post-Op Procedure(s) (LRB): MINIMALLY INVASIVE MITRAL VALVE REPAIR (MVR) (Right) MINIMALLY INVASIVE MAZE PROCEDURE (N/A) TRANSESOPHAGEAL ECHOCARDIOGRAM (TEE) (N/A)  Total Length of Stay:  LOS: 1 day  BP 124/74 mmHg  Pulse 80  Temp(Src) 98.4 F (36.9 C) (Oral)  Resp 16  Ht 6\' 1"  (1.854 m)  Wt 224 lb 3.3 oz (101.7 kg)  BMI 29.59 kg/m2  SpO2 98%  .Intake/Output      11/03 0701 - 11/04 0700   P.O. 360   I.V. (mL/kg) 158.6 (1.6)   Blood    NG/GT 30   IV Piggyback 300   Total Intake(mL/kg) 848.6 (8.3)   Urine (mL/kg/hr) 2070 (1.6)   Emesis/NG output 0 (0)   Blood    Chest Tube 320 (0.2)   Total Output 2390   Net -1541.4         . sodium chloride Stopped (10/23/15 0528)  . dexmedetomidine Stopped (10/23/15 0850)  . milrinone Stopped (10/23/15 1000)  . phenylephrine (NEO-SYNEPHRINE) Adult infusion Stopped (10/23/15 1200)     Lab Results  Component Value Date   WBC 11.2* 10/23/2015   HGB 11.6* 10/23/2015   HCT 34.0* 10/23/2015   PLT 81* 10/23/2015   GLUCOSE 151* 10/23/2015   ALT 18 10/20/2015   AST 21 10/20/2015   NA 138 10/23/2015   K 3.8 10/23/2015   CL 106 10/23/2015   CREATININE 1.00 10/23/2015   BUN 11 10/23/2015   CO2 19* 10/23/2015   TSH 3.078 08/15/2015   PSA 3.6 09/15/2015   INR 1.27 10/22/2015   HGBA1C 5.6 10/20/2015   Patient stable today, remains paced   Grace Isaac MD  Beeper [phone] Office [phone] 10/23/2015 7:41 PM

## 2015-10-23 NOTE — Procedures (Signed)
Extubation Procedure Note  Patient Details:   Name: Michael Delacruz DOB: 1941-01-12 MRN: [id number]   Airway Documentation:  Airway (Active)     Airway (Active)    Evaluation  O2 sats: stable throughout Complications: No apparent complications Patient did tolerate procedure well. Bilateral Breath Sounds: Clear Suctioning: Airway Yes   Pt. Was extubated to a 4L [facility] without any complications, dyspnea or stridor noted. Pt. Achieved -30 on NF & 1.5L on VC. Pt. Was instructed on IS X 5, highest goal was 773mL.   [name], [name] 10/23/2015, 8:40 AM

## 2015-10-23 NOTE — Progress Notes (Addendum)
[address]       [place] [address]             [phone]        CARDIOTHORACIC SURGERY PROGRESS NOTE   R1 Day Post-Op Procedure(s) (LRB): MINIMALLY INVASIVE MITRAL VALVE REPAIR (MVR) (Right) MINIMALLY INVASIVE MAZE PROCEDURE (N/A) TRANSESOPHAGEAL ECHOCARDIOGRAM (TEE) (N/A)  Subjective: Awake and alert on vent.  Denies pain.  Wants ET tube out.  Objective: Vital signs: BP Readings from Last 1 Encounters:  10/23/15 129/64   Pulse Readings from Last 1 Encounters:  10/23/15 80   Resp Readings from Last 1 Encounters:  10/23/15 18   Temp Readings from Last 1 Encounters:  10/23/15 97.2 F (36.2 C)     Hemodynamics: PAP: (18-59)/(2-27) 39/27 mmHg CO:  [4.3 L/min-8 L/min] 8 L/min CI:  [2 L/min/m2-3.7 L/min/m2] 3.7 L/min/m2  Physical Exam:  Rhythm:   Junctional 50's - AAI paced  Breath sounds: Coarse but clear  Heart sounds:  RRR w/out murmur  Incisions:  Dressings dry, intact  Abdomen:  Soft, non-distended, non-tender  Extremities:  Warm, well-perfused  Chest tubes:  Low volume thin serosanguinous output, no air leak    Intake/Output from previous day: 11/02 0701 - 11/03 0700 In: 7997 [I.V.:5331; Blood:836; NG/GT:280; IV Piggyback:1550] Out: 2774 [Urine:1505; Emesis/NG output:50; Blood:1750; Chest Tube:310] Intake/Output this shift: Total I/O In: 25.4 [I.V.:25.4] Out: -   Lab Results:  CBC: Recent Labs  10/23/15 10/23/15 0002 10/23/15 0410  WBC 13.6*  --  11.7*  HGB 11.8* 11.2* 11.0*  HCT 34.1* 33.0* 32.6*  PLT 84*  --  80*    BMET:  Recent Labs  10/20/15 1322  10/23/15 0002 10/23/15 0410  NA 141  < > 141 138  K 3.7  < > 3.9 3.7  CL 108  < > 107 114*  CO2 23  --   --  19*  GLUCOSE 122*  < > 108* 144*  BUN 10  < > 7 9  CREATININE 1.15  < > 0.80 0.99  CALCIUM 9.4  --   --  7.3*  < > = values in this interval not displayed.   PT/INR:   Recent Labs  10/22/15 1810  LABPROT 16.1*  INR 1.27    CBG (last 3)    Recent Labs  10/22/15 2043 10/22/15 2158 10/23/15 0401  GLUCAP 105* 106* 134*    ABG    Component Value Date/Time   PHART 7.324* 10/23/2015 0631   PCO2ART 34.6* 10/23/2015 0631   PO2ART 83.0 10/23/2015 0631   HCO3 18.2* 10/23/2015 0631   TCO2 19 10/23/2015 0631   ACIDBASEDEF 7.0* 10/23/2015 0631   O2SAT 96.0 10/23/2015 0631    CXR: PORTABLE CHEST 1 VIEW  COMPARISON: Portable chest x-ray of October 22, 2015.  FINDINGS: The lungs are adequately inflated. The interstitial markings remain increased. There is no pleural effusion or pneumothorax.There is retrocardiac subsegmental atelectasis. The cardiac silhouette remains enlarged. The pulmonary vascularity remains engorged.  The endotracheal tube tip projects 4.4 cm above the carina. The esophagogastric tube tip projects below the inferior margin of the image. The the right-sided chest tubes are unchanged in position. Swan-Ganz catheter tip projects over the proximal right main pulmonary artery. The left atrial appendage clip is unchanged in position. The mitral valve ring is only faintly visible.  IMPRESSION: Stable appearance of the chest with persistent pulmonary interstitial edema. There is no pneumothorax or significant pleural effusion. The support tubes are in reasonable  position.   Electronically Signed  By: David Martinique M.D.  On: 10/23/2015 07:29  Assessment/Plan: S/P Procedure(s) (LRB): MINIMALLY INVASIVE MITRAL VALVE REPAIR (MVR) (Right) MINIMALLY INVASIVE MAZE PROCEDURE (N/A) TRANSESOPHAGEAL ECHOCARDIOGRAM (TEE) (N/A)  Doing well POD1 Wide awake and alert on vent, looks ready for extubation No issues with oxygenation or gas exchange over last 6 hours, O2 sats 95-100% on 40% FiO2, CXR looks clear Maintaining AAI paced rhythm w/ stable hemodynamics on low dose milrinone Expected post op acute blood loss anemia, mild, stable Chronic diastolic CHF with expected post-op volume excess Post op  thrombocytopenia, platelet count stable 80k this morning COPD w/ long-standing tobacco abuse   Proceed with acute vent wean and extubation  Mobilize post extubation  Diuresis  Wean milrinone off  Continue AAI pacing and hold beta blockers for now  Duonebs and incentive spirometry  D/C Swan-Ganz and Aline  Leave chest tubes in place  Start coumadin slowly   Rexene Alberts, MD 10/23/2015 7:48 AM

## 2015-10-24 ENCOUNTER — Inpatient Hospital Stay (HOSPITAL_COMMUNITY): Payer: Medicare HMO

## 2015-10-24 LAB — BASIC METABOLIC PANEL
Anion gap: 6 (ref 5–15)
BUN: 12 mg/dL (ref 6–20)
CO2: 24 mmol/L (ref 22–32)
Calcium: 7.7 mg/dL — ABNORMAL LOW (ref 8.9–10.3)
Chloride: 106 mmol/L (ref 101–111)
Creatinine, Ser: 1.14 mg/dL (ref 0.61–1.24)
GFR calc Af Amer: >60 mL/min (ref >60)
GFR calc non Af Amer: >60 mL/min (ref >60)
Glucose, Bld: 112 mg/dL — ABNORMAL HIGH (ref 65–99)
Potassium: 4.1 mmol/L (ref 3.5–5.1)
Sodium: 136 mmol/L (ref 135–145)

## 2015-10-24 LAB — CBC
HCT: 32.1 % — ABNORMAL LOW (ref 39.0–52.0)
Hemoglobin: 10.9 g/dL — ABNORMAL LOW (ref 13.0–17.0)
MCH: 32.1 pg (ref 26.0–34.0)
MCHC: 34 g/dL (ref 30.0–36.0)
MCV: 94.4 fL (ref 78.0–100.0)
Platelets: 73 10*3/uL — ABNORMAL LOW (ref 150–400)
RBC: 3.4 MIL/uL — ABNORMAL LOW (ref 4.22–5.81)
RDW: 13.9 % (ref 11.5–15.5)
WBC: 7.8 10*3/uL (ref 4.0–10.5)

## 2015-10-24 LAB — GLUCOSE, CAPILLARY
Glucose-Capillary: 104 mg/dL — ABNORMAL HIGH (ref 65–99)
Glucose-Capillary: 128 mg/dL — ABNORMAL HIGH (ref 65–99)

## 2015-10-24 LAB — PROTIME-INR
INR: 1.21 (ref 0.00–1.49)
Prothrombin Time: 15.5 seconds — ABNORMAL HIGH (ref 11.6–15.2)

## 2015-10-24 MED ORDER — FUROSEMIDE 40 MG PO TABS
40.0000 mg | ORAL_TABLET | Freq: Two times a day (BID) | ORAL | Status: DC
  Administered 2015-10-25 – 2015-10-28 (×6): 40 mg via ORAL
  Filled 2015-10-24 (×7): qty 1

## 2015-10-24 MED ORDER — SODIUM CHLORIDE 0.9 % IJ SOLN: 3.0000 mL | INTRAMUSCULAR | Status: DC | PRN

## 2015-10-24 MED ORDER — IPRATROPIUM-ALBUTEROL 0.5-2.5 (3) MG/3ML IN SOLN: 3.0000 mL | Freq: Four times a day (QID) | RESPIRATORY_TRACT | Status: DC | PRN

## 2015-10-24 MED ORDER — SODIUM CHLORIDE 0.9 % IV SOLN: 250.0000 mL | INTRAVENOUS | Status: DC | PRN

## 2015-10-24 MED ORDER — KETOROLAC TROMETHAMINE 15 MG/ML IJ SOLN
15.0000 mg | Freq: Four times a day (QID) | INTRAMUSCULAR | Status: AC
  Administered 2015-10-24 – 2015-10-25 (×4): 15 mg via INTRAVENOUS
  Filled 2015-10-24 (×4): qty 1

## 2015-10-24 MED ORDER — FUROSEMIDE 10 MG/ML IJ SOLN
40.0000 mg | Freq: Two times a day (BID) | INTRAMUSCULAR | Status: AC
  Administered 2015-10-24 (×2): 40 mg via INTRAVENOUS
  Filled 2015-10-24 (×2): qty 4

## 2015-10-24 MED ORDER — ASPIRIN EC 81 MG PO TBEC
81.0000 mg | DELAYED_RELEASE_TABLET | Freq: Every day | ORAL | Status: DC
  Administered 2015-10-24 – 2015-10-28 (×5): 81 mg via ORAL
  Filled 2015-10-24 (×6): qty 1

## 2015-10-24 MED ORDER — SODIUM CHLORIDE 0.9 % IJ SOLN
3.0000 mL | Freq: Two times a day (BID) | INTRAMUSCULAR | Status: DC
  Administered 2015-10-24 – 2015-10-28 (×10): 3 mL via INTRAVENOUS

## 2015-10-24 MED ORDER — POTASSIUM CHLORIDE CRYS ER 20 MEQ PO TBCR
20.0000 meq | EXTENDED_RELEASE_TABLET | Freq: Two times a day (BID) | ORAL | Status: DC
  Administered 2015-10-25 – 2015-10-27 (×6): 20 meq via ORAL
  Filled 2015-10-24 (×6): qty 1

## 2015-10-24 MED ORDER — MOVING RIGHT ALONG BOOK
Freq: Once | Status: AC
  Administered 2015-10-24: 11:00:00
  Filled 2015-10-24: qty 1

## 2015-10-24 MED ORDER — AMIODARONE HCL 200 MG PO TABS
200.0000 mg | ORAL_TABLET | Freq: Two times a day (BID) | ORAL | Status: DC
  Administered 2015-10-24 – 2015-10-27 (×9): 200 mg via ORAL
  Filled 2015-10-24 (×8): qty 1

## 2015-10-24 NOTE — Progress Notes (Signed)
Pt transferred to 2W23 with belongings. Report given to receiving RN and all questions answered. VSS during transfer.  Pt assisted to chair in new room. CT hooked to suction.  RN and NT in room to accept pt. Family updated on patient's location.

## 2015-10-24 NOTE — Progress Notes (Signed)
Call placed to Dr. Prescott Gum.  Pt arrived to 2W in junctional rhythm with HR in the 60's.  Pt denies SOB or chest pain.  Per Dr. Prescott Gum, reattach pacing wires at rate of 60 VVI.  Also notified Dr. Prescott Gum that Pt was confused about receiving warfarin, per Pt he thought he would no longer be on warfarin and he normally takes Eliquis at home.  Per Dr. Prescott Gum, after surgery warfarin is used short term before Pt can resume Eliquis.  Education provided to Pt that warfarin is a short term strategy and Pt will resume Eliquis in a few weeks if continued anticoagulation is necessary.  Pt indicates understanding and agrees to take prescribed warfarin.  Pacing box connected as advised.  Pt stable, no s/s of distress.

## 2015-10-24 NOTE — Progress Notes (Signed)
[address]       [place] [address]             [phone]        CARDIOTHORACIC SURGERY PROGRESS NOTE   R2 Days Post-Op Procedure(s) (LRB): MINIMALLY INVASIVE MITRAL VALVE REPAIR (MVR) (Right) MINIMALLY INVASIVE MAZE PROCEDURE (N/A) TRANSESOPHAGEAL ECHOCARDIOGRAM (TEE) (N/A)  Subjective: Looks good.  Mild soreness in chest.  Objective: Vital signs: BP Readings from Last 1 Encounters:  10/24/15 129/82   Pulse Readings from Last 1 Encounters:  10/24/15 80   Resp Readings from Last 1 Encounters:  10/24/15 21   Temp Readings from Last 1 Encounters:  10/24/15 98.3 F (36.8 C) Oral    Hemodynamics: PAP: (31-44)/(19-29) 32/19 mmHg CO:  [6 L/min-8.8 L/min] 6 L/min CI:  [2.8 L/min/m2-4.1 L/min/m2] 2.8 L/min/m2  Physical Exam:  Rhythm:   Sinus 60's  Breath sounds: clear  Heart sounds:  RRR w/out murmur  Incisions:  Clean and dry  Abdomen:  Soft, non-distended, non-tender  Extremities:  Warm, well-perfused  Chest tubes:  Decreased volume thin serosanguinous output but still draining, no air leak    Intake/Output from previous day: 11/03 0701 - 11/04 0700 In: 1248.6 [P.O.:660; I.V.:158.6; NG/GT:30; IV Piggyback:400] Out: 3485 [Urine:2665; Chest Tube:820] Intake/Output this shift:    Lab Results:  CBC: Recent Labs  10/23/15 1630 10/23/15 1637 10/24/15 0336  WBC 11.2*  --  7.8  HGB 11.0* 11.6* 10.9*  HCT 32.9* 34.0* 32.1*  PLT 81*  --  73*    BMET:  Recent Labs  10/23/15 0410  10/23/15 1637 10/24/15 0336  NA 138  --  138 136  K 3.7  --  3.8 4.1  CL 114*  --  106 106  CO2 19*  --   --  24  GLUCOSE 144*  --  151* 112*  BUN 9  --  11 12  CREATININE 0.99  < > 1.00 1.14  CALCIUM 7.3*  --   --  7.7*  < > = values in this interval not displayed.   PT/INR:   Recent Labs  10/24/15 0336  LABPROT 15.5*  INR 1.21    CBG (last 3)   Recent Labs  10/23/15 1914 10/23/15 2341 10/24/15 0407  GLUCAP 121* 105* 104*     ABG    Component Value Date/Time   PHART 7.342* 10/23/2015 0948   PCO2ART 34.6* 10/23/2015 0948   PO2ART 48.0* 10/23/2015 0948   HCO3 18.9* 10/23/2015 0948   TCO2 18 10/23/2015 1637   ACIDBASEDEF 6.0* 10/23/2015 0948   O2SAT 83.0 10/23/2015 0948    CXR: PORTABLE CHEST 1 VIEW  COMPARISON: Portable chest x-ray of October 22, 2012  FINDINGS: There has been interval extubation of the trachea and esophagus. The right lung is slightly less well inflated and exhibits persistently increased interstitial densities. The right hemidiaphragm is less well demonstrated. The right-sided chest tubes are unchanged in position. There is no significant pleural effusion and no pneumothorax. The interstitial markings on the left have improved further. The cardiac silhouette remains enlarged. The left atrial appendage clip is again demonstrated. The mitral valve ring is faintly visible. The Swan-Ganz catheter is been removed. The left internal jugular Cordis sheath remains.  IMPRESSION: Interval extubation of the trachea. Slight increased density in the mid and lower lung consistent with interstitial edema and subsegmental atelectasis. Improving interstitial edema on the left. Minimal subsegmental retrocardiac atelectasis. Stable cardiomegaly with mild central pulmonary vascular congestion. The remaining  support tubes are in reasonable position.   Electronically Signed  By: David Martinique M.D.  On: 10/24/2015 07:28  Assessment/Plan: S/P Procedure(s) (LRB): MINIMALLY INVASIVE MITRAL VALVE REPAIR (MVR) (Right) MINIMALLY INVASIVE MAZE PROCEDURE (N/A) TRANSESOPHAGEAL ECHOCARDIOGRAM (TEE) (N/A)  Doing well POD2 Maintaining NSR w/ stable BP Resp status stable w/ O2 sats 92-93% on 3 L/min Expected post op acute blood loss anemia, mild, stable Chronic diastolic CHF with expected post-op volume excess, diuresing some Expected post op atelectasis, mild, R>L Post op thrombocytopenia,  stable, platelet count down slightly 73k COPD w/ long-standing tobacco abuse   Mobilize  Leave pacer off  Leave chest tubes in place until drainage resolves  Diuresis  Duonebs and incentive spirometry  D/C foley and central line  Coumadin  Transfer stepdown   Rexene Alberts, MD 10/24/2015 7:57 AM

## 2015-10-24 NOTE — Care Management Important Message (Signed)
Important Message  Patient Details  Name: Michael Delacruz MRN: [id number] Date of Birth: 11/19/1941   Medicare Important Message Given:  Yes-second notification given    Nathen May 10/24/2015, 10:27 AM

## 2015-10-25 ENCOUNTER — Inpatient Hospital Stay (HOSPITAL_COMMUNITY): Payer: Medicare HMO

## 2015-10-25 LAB — BASIC METABOLIC PANEL
Anion gap: 10 (ref 5–15)
BUN: 19 mg/dL (ref 6–20)
CO2: 26 mmol/L (ref 22–32)
Calcium: 8 mg/dL — ABNORMAL LOW (ref 8.9–10.3)
Chloride: 100 mmol/L — ABNORMAL LOW (ref 101–111)
Creatinine, Ser: 1.32 mg/dL — ABNORMAL HIGH (ref 0.61–1.24)
GFR calc Af Amer: >60 mL/min (ref >60)
GFR calc non Af Amer: 52 mL/min — ABNORMAL LOW (ref >60)
Glucose, Bld: 130 mg/dL — ABNORMAL HIGH (ref 65–99)
Potassium: 3.9 mmol/L (ref 3.5–5.1)
Sodium: 136 mmol/L (ref 135–145)

## 2015-10-25 LAB — CBC
HCT: 32 % — ABNORMAL LOW (ref 39.0–52.0)
Hemoglobin: 10.5 g/dL — ABNORMAL LOW (ref 13.0–17.0)
MCH: 31 pg (ref 26.0–34.0)
MCHC: 32.8 g/dL (ref 30.0–36.0)
MCV: 94.4 fL (ref 78.0–100.0)
Platelets: 85 10*3/uL — ABNORMAL LOW (ref 150–400)
RBC: 3.39 MIL/uL — ABNORMAL LOW (ref 4.22–5.81)
RDW: 13.8 % (ref 11.5–15.5)
WBC: 7.4 10*3/uL (ref 4.0–10.5)

## 2015-10-25 LAB — PROTIME-INR
INR: 1.24 (ref 0.00–1.49)
Prothrombin Time: 15.7 seconds — ABNORMAL HIGH (ref 11.6–15.2)

## 2015-10-25 MED ORDER — COUMADIN BOOK
Freq: Once | Status: AC
  Administered 2015-10-25: 15:00:00
  Filled 2015-10-25: qty 1

## 2015-10-25 MED ORDER — AMLODIPINE BESYLATE 5 MG PO TABS
5.0000 mg | ORAL_TABLET | Freq: Every day | ORAL | Status: DC
  Administered 2015-10-25: 5 mg via ORAL
  Filled 2015-10-25 (×2): qty 1

## 2015-10-25 NOTE — Progress Notes (Addendum)
      [address]       [place] [address]             [phone]      3 Days Post-Op Procedure(s) (LRB): MINIMALLY INVASIVE MITRAL VALVE REPAIR (MVR) (Right) MINIMALLY INVASIVE MAZE PROCEDURE (N/A) TRANSESOPHAGEAL ECHOCARDIOGRAM (TEE) (N/A)   Subjective:  [name] has no specific complaints.  He is tolerating a diet.  + ambulation  Objective: Vital signs in last 24 hours: Temp:  [98.1 F (36.7 C)-98.5 F (36.9 C)] 98.3 F (36.8 C) (11/05 0412) Pulse Rate:  [57-67] 57 (11/05 0412) Cardiac Rhythm:  [-] Ventricular paced (11/04 1938) Resp:  [14-25] 20 (11/05 0412) BP: (120-160)/(67-86) 159/72 mmHg (11/05 0412) SpO2:  [91 %-97 %] 96 % (11/05 0412) Weight:  [214 lb 11.2 oz (97.387 kg)] 214 lb 11.2 oz (97.387 kg) (11/05 0412)  Intake/Output from previous day: 11/04 0701 - 11/05 0700 In: 690 [P.O.:600; I.V.:90] Out: 660 [Urine:280; Chest Tube:380]    General appearance: alert, cooperative and no distress Heart: regular rate and rhythm Lungs: clear to auscultation bilaterally Abdomen: soft, non-tender; bowel sounds normal; no masses,  no organomegaly Extremities: edema trace Wound: clean and dry  Lab Results:  Recent Labs  10/24/15 0336 10/25/15 0323  WBC 7.8 7.4  HGB 10.9* 10.5*  HCT 32.1* 32.0*  PLT 73* 85*   BMET:  Recent Labs  10/24/15 0336 10/25/15 0323  NA 136 136  K 4.1 3.9  CL 106 100*  CO2 24 26  GLUCOSE 112* 130*  BUN 12 19  CREATININE 1.14 1.32*  CALCIUM 7.7* 8.0*    PT/INR:  Recent Labs  10/25/15 0323  LABPROT 15.7*  INR 1.24   ABG    Component Value Date/Time   PHART 7.342* 10/23/2015 0948   HCO3 18.9* 10/23/2015 0948   TCO2 18 10/23/2015 1637   ACIDBASEDEF 6.0* 10/23/2015 0948   O2SAT 83.0 10/23/2015 0948   CBG (last 3)   Recent Labs  10/23/15 2341 10/24/15 0407 10/24/15 0845  GLUCAP 105* 104* 128*    Assessment/Plan: S/P Procedure(s) (LRB): MINIMALLY INVASIVE MITRAL VALVE REPAIR (MVR)  (Right) MINIMALLY INVASIVE MAZE PROCEDURE (N/A) TRANSESOPHAGEAL ECHOCARDIOGRAM (TEE) (N/A)  1. CV- Sinus Brady- rate in the 50s- continue to hold Beta Blocker, + hypertension- will start low dose Norvasc 2. Pulm- wean oxygen as tolerated, continue IS 3. Renal- creatinine slowly trending up, remains hypervolemic will continue Lasix 4. Expected Blood Loss Anemia- mild hgb at 10.5 5. Dispo- patient stable, remains bradycardic will hold beta blocker, Norvasc for HTN with rising creatinine, chest tube output was 470 will leave in place today... Continue current care   LOS: 3 days    BARRETT, ERIN 10/25/2015   Follow rhytm patient examined and medical record reviewed,agree with above note. Tharon Aquas Trigt III 10/25/2015

## 2015-10-25 NOTE — Progress Notes (Signed)
CARDIAC REHAB PHASE I   PRE:  Rate/Rhythm: 56    BP: sitting 148/69    SaO2: 99 4L  MODE:  Ambulation: 350 ft   POST:  Rate/Rhythm: 70    BP: sitting 150/79     SaO2: 95 4L, 96 2L with rest after walk  Pt c/o SOB upon entering room and pain. He has been scared of taking pain meds. Convinced pt to take pain meds as needed so he could mobilize. Also c/o feeling anxious but agreeable to walk. Pt able to stand well and walk with RW, min assist. Used 4L O2. Seemed to tolerated fairly well with minimal c/o. Return to recliner after walk. Decreased O2 to 2L (apparently had been increased to 4L earlier when he was SOB going to BR). Encouraged x2 more walks and IS. Pts IS was across the room packed up. Inspiring 700 ml.  [phone]   Josephina Shih [place] CES, ACSM 10/25/2015 1:58 PM

## 2015-10-25 NOTE — Progress Notes (Signed)
10/25/2015 12:04 AM A nurse from [facility] was asked to take a look at the pt's external pacer because it didn't seem to be capturing.  Pt's heart rate was 57 and BP 122/70.  The [facility] stated the pt was stable and doing fine at maintaining his own heart rate while his BP was stable. Also, the pt was tolerating not being V paced. The pacer settings were changed to back up at VVI 50, MA 12, and sensitivity 0.8. Will continue to monitor pt. Lupita Dawn, RN

## 2015-10-25 NOTE — Discharge Instructions (Addendum)
Information on my medicine - Coumadin®   (Warfarin) ° °Why was Coumadin prescribed for you? °Coumadin was prescribed for you because you have a blood clot or a medical condition that can cause an increased risk of forming blood clots. Blood clots can cause serious health problems by blocking the flow of blood to the heart, lung, or brain. Coumadin can prevent harmful blood clots from forming. °As a reminder your indication for Coumadin is:   Stroke Prevention Because Of Atrial Fibrillation ° °What test will check on my response to Coumadin? °While on Coumadin (warfarin) you will need to have an INR test regularly to ensure that your dose is keeping you in the desired range. The INR (international normalized ratio) number is calculated from the result of the laboratory test called prothrombin time (PT). ° °If an INR APPOINTMENT HAS NOT ALREADY BEEN MADE FOR YOU please schedule an appointment to have this lab work done by your health care provider within 7 days. °Your INR goal is usually a number between:  2 to 3 or your provider may give you a more narrow range like 2-2.5.  Ask your health care provider during an office visit what your goal INR is. ° °What  do you need to  know  About  COUMADIN? °Take Coumadin (warfarin) exactly as prescribed by your healthcare provider about the same time each day.  DO NOT stop taking without talking to the doctor who prescribed the medication.  Stopping without other blood clot prevention medication to take the place of Coumadin may increase your risk of developing a new clot or stroke.  Get refills before you run out. ° °What do you do if you miss a dose? °If you miss a dose, take it as soon as you remember on the same day then continue your regularly scheduled regimen the next day.  Do not take two doses of Coumadin at the same time. ° °Important Safety Information °A possible side effect of Coumadin (Warfarin) is an increased risk of bleeding. You should call your healthcare  provider right away if you experience any of the following: °? Bleeding from an injury or your nose that does not stop. °? Unusual colored urine (red or dark brown) or unusual colored stools (red or black). °? Unusual bruising for unknown reasons. °? A serious fall or if you hit your head (even if there is no bleeding). ° °Some foods or medicines interact with Coumadin® (warfarin) and might alter your response to warfarin. To help avoid this: °? Eat a balanced diet, maintaining a consistent amount of Vitamin K. °? Notify your provider about major diet changes you plan to make. °? Avoid alcohol or limit your intake to 1 drink for women and 2 drinks for men per day. °(1 drink is 5 oz. wine, 12 oz. beer, or 1.5 oz. liquor.) ° °Make sure that ANY health care provider who prescribes medication for you knows that you are taking Coumadin (warfarin).  Also make sure the healthcare provider who is monitoring your Coumadin knows when you have started a new medication including herbals and non-prescription products. ° °Coumadin® (Warfarin)  Major Drug Interactions  °Increased Warfarin Effect Decreased Warfarin Effect  °Alcohol (large quantities) °Antibiotics (esp. Septra/Bactrim, Flagyl, Cipro) °Amiodarone (Cordarone) °Aspirin (ASA) °Cimetidine (Tagamet) °Megestrol (Megace) °NSAIDs (ibuprofen, naproxen, etc.) °Piroxicam (Feldene) °Propafenone (Rythmol SR) °Propranolol (Inderal) °Isoniazid (INH) °Posaconazole (Noxafil) Barbiturates (Phenobarbital) °Carbamazepine (Tegretol) °Chlordiazepoxide (Librium) °Cholestyramine (Questran) °Griseofulvin °Oral Contraceptives °Rifampin °Sucralfate (Carafate) °Vitamin K  ° °Coumadin® (Warfarin) Major Herbal   Interactions  Increased Warfarin Effect Decreased Warfarin Effect  Garlic Ginseng Ginkgo biloba Coenzyme Q10 Green tea St. Johns wort    Coumadin (Warfarin) FOOD Interactions  Eat a consistent number of servings per week of foods HIGH in Vitamin K (1 serving =  cup)  Collards  (cooked, or boiled & drained) Kale (cooked, or boiled & drained) Mustard greens (cooked, or boiled & drained) Parsley *serving size only =  cup Spinach (cooked, or boiled & drained) Swiss chard (cooked, or boiled & drained) Turnip greens (cooked, or boiled & drained)  Eat a consistent number of servings per week of foods MEDIUM-HIGH in Vitamin K (1 serving = 1 cup)  Asparagus (cooked, or boiled & drained) Broccoli (cooked, boiled & drained, or raw & chopped) Brussel sprouts (cooked, or boiled & drained) *serving size only =  cup Lettuce, raw (green leaf, endive, romaine) Spinach, raw Turnip greens, raw & chopped   These websites have more information on Coumadin (warfarin):  FailFactory.se; VeganReport.com.au;   Mitral Valve Repair, Care After Refer to this sheet in the next few weeks. These instructions provide you with information on caring for yourself after your procedure. Your health care provider may also give you specific instructions. Your treatment has been planned according to current medical practices, but problems sometimes occur. Call your health care provider if you have any problems or questions after your procedure.  HOME CARE INSTRUCTIONS   Take medicines only as directed by your health care provider.  Take your temperature every morning for the first 7 days after surgery. Write these down.  Weigh yourself every morning for at least 7 days after surgery. Write your weight down.  Wear elastic stockings during the day for at least 2 weeks after surgery or as directed by your health care provider. Use them longer if your ankles are swollen. The stockings help blood flow and help reduce swelling in the legs.  Take frequent naps or rest often throughout the day.  Avoid lifting more than 10 lb (4.5 kg) or pushing or pulling things with your arms for 6-8 weeks or as directed by your health care provider.  Avoid driving or airplane travel for 4-6 weeks  after surgery or as directed. If you are riding in a car for an extended period, stop every 1-2 hours to stretch your legs.  Avoid crossing your legs.  Avoid climbing stairs and using the handrail to pull yourself up for the first 2-3 weeks after surgery.  Do not take baths for 2-4 weeks after surgery. Take showers once your health care provider approves. Pat incisions dry. Do not rub incisions with a washcloth or towel.  Return to work as directed by your health care provider.  Drink enough fluids to keep your urine clear or pale yellow.  Do not strain to have a bowel movement. Eat high-fiber foods if you become constipated. You may also take a medicine to help you have a bowel movement (laxative) as directed by your health care provider.  Resume sexual activity as directed by your health care provider. SEEK MEDICAL CARE IF:   You develop a skin rash.   Your weight is increasing each day over 2-3 days.  Your weight increases by 2 or more pounds (1 kg) in a single day.  You have a fever. SEEK IMMEDIATE MEDICAL CARE IF:   You develop chest pain that is not coming from your incision.  You develop shortness of breath or difficulty breathing.  You have drainage, redness, swelling,  or pain at your incision site.  You have pus coming from your incision.  You develop light-headedness. MAKE SURE YOU:  Understand these directions.  Will watch your condition.  Will get help right away if you are not doing well or get worse.   This information is not intended to replace advice given to you by your health care provider. Make sure you discuss any questions you have with your health care provider.   Document Released: 06/25/2005 Document Revised: 12/27/2014 Document Reviewed: 05/08/2013 Elsevier Interactive Patient Education [employer].

## 2015-10-26 ENCOUNTER — Inpatient Hospital Stay (HOSPITAL_COMMUNITY): Payer: Medicare HMO

## 2015-10-26 LAB — PROTIME-INR
INR: 1.2 (ref 0.00–1.49)
Prothrombin Time: 15.4 seconds — ABNORMAL HIGH (ref 11.6–15.2)

## 2015-10-26 MED ORDER — ALPRAZOLAM 0.5 MG PO TABS
0.5000 mg | ORAL_TABLET | Freq: Three times a day (TID) | ORAL | Status: DC | PRN
  Administered 2015-10-26 – 2015-10-28 (×5): 0.5 mg via ORAL
  Filled 2015-10-26 (×6): qty 1

## 2015-10-26 MED ORDER — AMLODIPINE BESYLATE 10 MG PO TABS
10.0000 mg | ORAL_TABLET | Freq: Every day | ORAL | Status: DC
  Administered 2015-10-26 – 2015-10-27 (×2): 10 mg via ORAL
  Filled 2015-10-26 (×2): qty 1

## 2015-10-26 MED ORDER — WARFARIN SODIUM 5 MG PO TABS
5.0000 mg | ORAL_TABLET | Freq: Every day | ORAL | Status: DC
  Administered 2015-10-26 – 2015-10-28 (×3): 5 mg via ORAL
  Filled 2015-10-26 (×3): qty 1

## 2015-10-26 NOTE — Progress Notes (Addendum)
      [address]       [place] [address]             [phone]      4 Days Post-Op Procedure(s) (LRB): MINIMALLY INVASIVE MITRAL VALVE REPAIR (MVR) (Right) MINIMALLY INVASIVE MAZE PROCEDURE (N/A) TRANSESOPHAGEAL ECHOCARDIOGRAM (TEE) (N/A)   Subjective:  Michael Delacruz states he had a rough night.  He has been unable to sleep.  He states he feels very anxious.  His hallucinations have improved.  + ambulation. + BM  Objective: Vital signs in last 24 hours: Temp:  [98.1 F (36.7 C)-98.4 F (36.9 C)] 98.4 F (36.9 C) (11/06 0516) Pulse Rate:  [62-78] 62 (11/06 0516) Cardiac Rhythm:  [-] Junctional rhythm (11/05 1900) Resp:  [18-19] 18 (11/06 0516) BP: (141-160)/(62-69) 141/62 mmHg (11/06 0516) SpO2:  [94 %-99 %] 94 % (11/06 0858) Weight:  [211 lb 10.3 oz (96 kg)] 211 lb 10.3 oz (96 kg) (11/06 0300)  Intake/Output from previous day: 11/05 0701 - 11/06 0700 In: 720 [P.O.:720] Out: 1150 [Urine:700; Chest Tube:450] Intake/Output this shift: Total I/O In: -  Out: 1225 [Urine:700; Chest Tube:525]  General appearance: alert, cooperative and no distress Heart: regular rate and rhythm Lungs: clear to auscultation bilaterally Abdomen: soft, non-tender; bowel sounds normal; no masses,  no organomegaly Extremities: edema trace Wound: clean and dry  Lab Results:  Recent Labs  10/24/15 0336 10/25/15 0323  WBC 7.8 7.4  HGB 10.9* 10.5*  HCT 32.1* 32.0*  PLT 73* 85*   BMET:  Recent Labs  10/24/15 0336 10/25/15 0323  NA 136 136  K 4.1 3.9  CL 106 100*  CO2 24 26  GLUCOSE 112* 130*  BUN 12 19  CREATININE 1.14 1.32*  CALCIUM 7.7* 8.0*    PT/INR:  Recent Labs  10/26/15 0111  LABPROT 15.4*  INR 1.20   ABG    Component Value Date/Time   PHART 7.342* 10/23/2015 0948   HCO3 18.9* 10/23/2015 0948   TCO2 18 10/23/2015 1637   ACIDBASEDEF 6.0* 10/23/2015 0948   O2SAT 83.0 10/23/2015 0948   CBG (last 3)   Recent Labs  10/23/15 2341  10/24/15 0407 10/24/15 0845  GLUCAP 105* 104* 128*    Assessment/Plan: S/P Procedure(s) (LRB): MINIMALLY INVASIVE MITRAL VALVE REPAIR (MVR) (Right) MINIMALLY INVASIVE MAZE PROCEDURE (N/A) TRANSESOPHAGEAL ECHOCARDIOGRAM (TEE) (N/A)  1. CV- NSR, rate has improved, up to 70s- continue to hold Beta Blocker for now- remains hypertensive, will start low dose Norvasc 2. Pulm- weaning oxygen as tolerated, Chest tubes had 480 cc output- will leave in place today 3. INR- not responding, will increase Coumadin to 5 mg daily 4. Renal- volume status improving, continue Lasix 5. Dispo- patient stable, HR improved into the 70s, will continue to hold Beta Blocker for now, Chest tube with 480 cc output yesterday will leave today, increase Coumadin.... Add Xanax for anxiety, d/c narcotics due to hallucinations   LOS: 4 days    BARRETT, ERIN 10/26/2015  HR now up to 70s Persistent chest tube output Cont lasix and add xanax at HS patient examined and medical record reviewed,agree with above note. Michael Delacruz 10/26/2015

## 2015-10-26 NOTE — Progress Notes (Signed)
Pt. Walked 500 ft. On room air x2 with rolling walker

## 2015-10-27 LAB — CBC
HCT: 32.6 % — ABNORMAL LOW (ref 39.0–52.0)
Hemoglobin: 10.9 g/dL — ABNORMAL LOW (ref 13.0–17.0)
MCH: 30.8 pg (ref 26.0–34.0)
MCHC: 33.4 g/dL (ref 30.0–36.0)
MCV: 92.1 fL (ref 78.0–100.0)
Platelets: 132 10*3/uL — ABNORMAL LOW (ref 150–400)
RBC: 3.54 MIL/uL — ABNORMAL LOW (ref 4.22–5.81)
RDW: 13.3 % (ref 11.5–15.5)
WBC: 5.5 10*3/uL (ref 4.0–10.5)

## 2015-10-27 LAB — BASIC METABOLIC PANEL
Anion gap: 10 (ref 5–15)
BUN: 9 mg/dL (ref 6–20)
CO2: 28 mmol/L (ref 22–32)
Calcium: 8.3 mg/dL — ABNORMAL LOW (ref 8.9–10.3)
Chloride: 99 mmol/L — ABNORMAL LOW (ref 101–111)
Creatinine, Ser: 0.98 mg/dL (ref 0.61–1.24)
GFR calc Af Amer: >60 mL/min (ref >60)
GFR calc non Af Amer: >60 mL/min (ref >60)
Glucose, Bld: 110 mg/dL — ABNORMAL HIGH (ref 65–99)
Potassium: 3.3 mmol/L — ABNORMAL LOW (ref 3.5–5.1)
Sodium: 137 mmol/L (ref 135–145)

## 2015-10-27 LAB — PROTIME-INR
INR: 1.26 (ref 0.00–1.49)
Prothrombin Time: 15.9 seconds — ABNORMAL HIGH (ref 11.6–15.2)

## 2015-10-27 NOTE — Progress Notes (Signed)
Called to room by Pt.  Pt very upset that he is still on a heart healthy diet.  Education provided on why Pt is on a heart healthy diet.  Pt states he has not been able to eat anything and he wants this nurse to call Dr. Roxy Manns to get diet changed.    Call placed to PA and notified of Pt continuing to be upset over diet choices.  Per PA ok to change Pt to general diet.  Order placed.  Pt notified of change.

## 2015-10-27 NOTE — Progress Notes (Signed)
UR Completed. Taneya Conkel, RN, BSN.  [phone] 

## 2015-10-27 NOTE — Progress Notes (Addendum)
      [address]       [place] [address]             [phone]      5 Days Post-Op Procedure(s) (LRB): MINIMALLY INVASIVE MITRAL VALVE REPAIR (MVR) (Right) MINIMALLY INVASIVE MAZE PROCEDURE (N/A) TRANSESOPHAGEAL ECHOCARDIOGRAM (TEE) (N/A)   Subjective:  Mr. Nong states he is feeling a bit better today.  He states the Xanax helped.  Objective: Vital signs in last 24 hours: Temp:  [98.6 F (37 C)] 98.6 F (37 C) (11/07 0432) Pulse Rate:  [68-83] 76 (11/07 0432) Cardiac Rhythm:  [-] Heart block (11/06 1900) Resp:  [18] 18 (11/07 0432) BP: (114-140)/(69-85) 137/69 mmHg (11/07 0432) SpO2:  [94 %-98 %] 97 % (11/07 0432) Weight:  [204 lb (92.534 kg)] 204 lb (92.534 kg) (11/07 0432)  Intake/Output from previous day: 11/06 0701 - 11/07 0700 In: 720 [P.O.:720] Out: 2226 [Urine:1400; Stool:1; Chest Tube:825]  General appearance: alert, cooperative and no distress Heart: regular rate and rhythm Lungs: clear to auscultation bilaterally Abdomen: soft, non-tender; bowel sounds normal; no masses,  no organomegaly Extremities: edema trace Wound: clean and dry  Lab Results:  Recent Labs  10/25/15 0323 10/27/15 0430  WBC 7.4 5.5  HGB 10.5* 10.9*  HCT 32.0* 32.6*  PLT 85* 132*   BMET:  Recent Labs  10/25/15 0323 10/27/15 0430  NA 136 137  K 3.9 3.3*  CL 100* 99*  CO2 26 28  GLUCOSE 130* 110*  BUN 19 9  CREATININE 1.32* 0.98  CALCIUM 8.0* 8.3*    PT/INR:  Recent Labs  10/27/15 0430  LABPROT 15.9*  INR 1.26   ABG    Component Value Date/Time   PHART 7.342* 10/23/2015 0948   HCO3 18.9* 10/23/2015 0948   TCO2 18 10/23/2015 1637   ACIDBASEDEF 6.0* 10/23/2015 0948   O2SAT 83.0 10/23/2015 0948   CBG (last 3)   Recent Labs  10/24/15 0845  GLUCAP 128*    Assessment/Plan: S/P Procedure(s) (LRB): MINIMALLY INVASIVE MITRAL VALVE REPAIR (MVR) (Right) MINIMALLY INVASIVE MAZE PROCEDURE (N/A) TRANSESOPHAGEAL ECHOCARDIOGRAM (TEE)  (N/A)  1. CV- maintaining NSR- rate in the 70s, hold beta blocker, pressure improved on Norvasc at 10 mg daily 2. Pulm- no acute issues, off oxygen 3. Chest tube- 250 cc output yesterday (level currently at 750)- will leave one more day 4. INR 1.26, will continue Coumadin at 5 mg daily 5. Dispo- patient stable, maintaining NSR, will turn off back up pacer, if remains stable will d/c wires tomorrow, chest tubes with 250 cc output yesterday will leave one more day.... Continue current care   LOS: 5 days    BARRETT, ERIN 10/27/2015  Leave chest tube in today , d/c when dry inr 1.26 I have seen and examined Colin Benton and agree with the above assessment  and plan.  Grace Isaac MD [facility] [phone] Office [phone] 10/27/2015 11:46 AM

## 2015-10-27 NOTE — Progress Notes (Signed)
CARDIAC REHAB PHASE I   PRE:  Rate/Rhythm: 79 SR  BP:  Sitting: 130/79        SaO2: 97 RA  MODE:  Ambulation: 490 ft   POST:  Rate/Rhythm: 87 SR  BP:  Sitting: 124/81         SaO2: 94 RA  Pt agreeable to ambulate, requested to go to bathroom, had bowel movement. Pt ambulated 490 ft on RA, rolling walker, chest tube, assist x1, steady gait, small steps, tolerated well. Pt c/o DOE, states this is normal for him, denies pain, dizziness, declined rest stop. Pt declined to sit up in recliner, states "I'm going to do what I want to do." Pt to bed per pt request after walk, call bell within reach. Will follow.   [phone]  Lenna Sciara, RN, BSN 10/27/2015 10:48 AM

## 2015-10-28 LAB — PROTIME-INR
INR: 1.57 — ABNORMAL HIGH (ref 0.00–1.49)
Prothrombin Time: 18.9 seconds — ABNORMAL HIGH (ref 11.6–15.2)

## 2015-10-28 MED ORDER — METOPROLOL TARTRATE 12.5 MG HALF TABLET
12.5000 mg | ORAL_TABLET | Freq: Two times a day (BID) | ORAL | Status: DC
  Administered 2015-10-28 (×2): 12.5 mg via ORAL
  Filled 2015-10-28 (×4): qty 1

## 2015-10-28 MED ORDER — AMIODARONE HCL 200 MG PO TABS
200.0000 mg | ORAL_TABLET | Freq: Every day | ORAL | Status: DC
  Administered 2015-10-28 – 2015-10-29 (×2): 200 mg via ORAL
  Filled 2015-10-28 (×2): qty 1

## 2015-10-28 MED ORDER — POTASSIUM CHLORIDE ER 10 MEQ PO TBCR
20.0000 meq | EXTENDED_RELEASE_TABLET | Freq: Two times a day (BID) | ORAL | Status: DC
  Administered 2015-10-28: 20 meq via ORAL
  Filled 2015-10-28 (×3): qty 2

## 2015-10-28 MED ORDER — FUROSEMIDE 40 MG PO TABS
40.0000 mg | ORAL_TABLET | Freq: Two times a day (BID) | ORAL | Status: DC
  Administered 2015-10-28: 40 mg via ORAL
  Filled 2015-10-28 (×3): qty 1

## 2015-10-28 MED ORDER — POTASSIUM CHLORIDE CRYS ER 20 MEQ PO TBCR
40.0000 meq | EXTENDED_RELEASE_TABLET | Freq: Two times a day (BID) | ORAL | Status: DC
  Administered 2015-10-28 – 2015-10-29 (×3): 40 meq via ORAL
  Filled 2015-10-28 (×3): qty 2

## 2015-10-28 MED ORDER — LISINOPRIL 10 MG PO TABS
10.0000 mg | ORAL_TABLET | Freq: Every day | ORAL | Status: DC
  Administered 2015-10-28 – 2015-10-29 (×2): 10 mg via ORAL
  Filled 2015-10-28 (×2): qty 1

## 2015-10-28 NOTE — Progress Notes (Signed)
Pericardial pacing wires X2 removed without difficulty. Tips intact. VS and rhythm stable. Pt tolerated well. Pt to be on bedrest with VS monitoring for 1 hour. Pt educated.

## 2015-10-28 NOTE — Care Management Important Message (Signed)
Important Message  Patient Details  Name: Michael Delacruz MRN: [id number] Date of Birth: 17-Dec-1941   Medicare Important Message Given:  Yes-third notification given    Nathen May 10/28/2015, 10:22 AM

## 2015-10-28 NOTE — Progress Notes (Addendum)
      [address]       [place] [address]             [phone]      6 Days Post-Op Procedure(s) (LRB): MINIMALLY INVASIVE MITRAL VALVE REPAIR (MVR) (Right) MINIMALLY INVASIVE MAZE PROCEDURE (N/A) TRANSESOPHAGEAL ECHOCARDIOGRAM (TEE) (N/A)   Subjective:  Mr. Popp has no complaints this morning.  He is ambulating without much difficulty.  + BM  Objective: Vital signs in last 24 hours: Temp:  [97.9 F (36.6 C)-98.6 F (37 C)] 98.4 F (36.9 C) (11/08 0515) Pulse Rate:  [74-82] 74 (11/08 0515) Cardiac Rhythm:  [-] Heart block (11/07 1900) Resp:  [20] 20 (11/08 0515) BP: (113-138)/(70-72) 138/71 mmHg (11/08 0515) SpO2:  [93 %-98 %] 93 % (11/08 0515) Weight:  [200 lb 12.8 oz (91.082 kg)] 200 lb 12.8 oz (91.082 kg) (11/08 0515)  Intake/Output from previous day: 11/07 0701 - 11/08 0700 In: 720 [P.O.:720] Out: 1575 [Urine:1325; Chest Tube:250]  General appearance: alert, cooperative and no distress Heart: regular rate and rhythm Lungs: clear to auscultation bilaterally Abdomen: soft, non-tender; bowel sounds normal; no masses,  no organomegaly Extremities: edema trac Wound: clean and dry  Lab Results:  Recent Labs  10/27/15 0430  WBC 5.5  HGB 10.9*  HCT 32.6*  PLT 132*   BMET:  Recent Labs  10/27/15 0430  NA 137  K 3.3*  CL 99*  CO2 28  GLUCOSE 110*  BUN 9  CREATININE 0.98  CALCIUM 8.3*    PT/INR:  Recent Labs  10/28/15 0221  LABPROT 18.9*  INR 1.57*   ABG    Component Value Date/Time   PHART 7.342* 10/23/2015 0948   HCO3 18.9* 10/23/2015 0948   TCO2 18 10/23/2015 1637   ACIDBASEDEF 6.0* 10/23/2015 0948   O2SAT 83.0 10/23/2015 0948   CBG (last 3)  No results for input(s): GLUCAP in the last 72 hours.  Assessment/Plan: S/P Procedure(s) (LRB): MINIMALLY INVASIVE MITRAL VALVE REPAIR (MVR) (Right) MINIMALLY INVASIVE MAZE PROCEDURE (N/A) TRANSESOPHAGEAL ECHOCARDIOGRAM (TEE) (N/A)  1. CV- remains in NSR, rate in the  70s, pressure controlled with Norvasc, will defer starting of Beta Blocker to Dr. Roxy Manns... Will d/c EPW 2. Chest tube- 200 cc output yesterday- can likely remove chest tubes today 3. Renal- Hypokalemic, supplement potassium, weight is almost to baseline, could possibly decrease lasix to once a day dose 4. INR trending up at 1.55, continue Coumadin at 5 mg daily 5. Dispo- patient stable, d/c chest tubes and pacing wires today... If remains stable home in AM   LOS: 6 days    BARRETT, ERIN 10/28/2015  I have seen and examined the patient and agree with the assessment and plan as outlined.  Start low dose beta blocker and ACE-I.  D/C Norvasc.  Possible D/C home tomorrow  Rexene Alberts, MD 10/28/2015

## 2015-10-28 NOTE — Consult Note (Signed)
   [facility] Inpatient Consult   10/28/2015  Michael Delacruz [birth year] [id number]   Family member referral made on patient's behalf for [place] [employer] services. Cross checked for eligibility. However, patient is not a beneficiary currently attributed to one of the [employer].  Membership roster used to verify non- eligible status. Made inpatient RNCM aware.  Marthenia Rolling, MSN-Ed, RN,BSN [facility] Liaison [phone]

## 2015-10-28 NOTE — Progress Notes (Signed)
CARDIAC REHAB PHASE I   PRE:  Rate/Rhythm: 54 SR  BP:  Sitting: 122/77        SaO2: 94 RA  MODE:  Ambulation: 390 ft   POST:  Rate/Rhythm: 69 SR  BP:  Sitting: 121/74         SaO2: 95 RA  Pt ambulated 390 ft on RA, assist x1, rolling walker, steady gait, tolerated well. Pt c/o mild DOE, denies pain, dizziness, brief standing rest x1. Pt sates 93-94 % on RA during ambulation. Encouraged additional ambulation today, pt verbalized understanding. Pt assisted to visit family member in neighboring room after walk, RN aware. Will follow-up tomorrow.  [phone]  Lenna Sciara, RN, BSN 10/28/2015 3:22 PM

## 2015-10-28 NOTE — Discharge Summary (Signed)
Physician Discharge Summary  Patient ID: Michael Delacruz MRN: [id number] DOB/AGE: 72-Jan-1942 72 y.o.  Admit date: 10/22/2015 Discharge date: 10/29/2015   Admission Diagnoses:  Patient Active Problem List   Diagnosis Date Noted  . S/P minimally invasive mitral valve repair + maze procedure 10/22/2015  . S/P Minimally invasive maze operation for atrial fibrillation 10/22/2015  . Atrial fibrillation, unspecified 10/06/2015  . Angina pectoris ([place]) 10/03/2015  . Chronic diastolic congestive heart failure ([place])   . Atrial fibrillation ([place]) 08/15/2015  . Atrial fibrillation with RVR ([place])   . Centrilobular emphysema ([place])   . Shortness of breath   . Hypotension 11/02/2012  . Stress at home 11/02/2012  . Leg pain 11/02/2012  . Smoking 04/27/2012  . Hyperlipidemia 04/27/2012  . CAD (coronary artery disease) 04/27/2012  . Heart palpitations 09/03/2011  . Dizziness 05/19/2011  . SOB (shortness of breath) 05/05/2011  . MVP (mitral valve prolapse) 05/05/2011  . Mitral valve regurgitation 05/05/2011  . COPD (chronic obstructive pulmonary disease) ([place]) 05/05/2011  . Pulmonary HTN ([place]) 05/05/2011  . CHEST PAIN UNSPECIFIED 01/04/2011    Discharge Diagnoses:   Patient Active Problem List   Diagnosis Date Noted  . S/P minimally invasive mitral valve repair + maze procedure 10/22/2015  . S/P Minimally invasive maze operation for atrial fibrillation 10/22/2015  . Atrial fibrillation, unspecified 10/06/2015  . Angina pectoris ([place]) 10/03/2015  . Chronic diastolic congestive heart failure ([place])   . Atrial fibrillation ([place]) 08/15/2015  . Atrial fibrillation with RVR ([place])   . Centrilobular emphysema ([place])   . Shortness of breath   . Hypotension 11/02/2012  . Stress at home 11/02/2012  . Leg pain 11/02/2012  . Smoking 04/27/2012  . Hyperlipidemia 04/27/2012  . CAD (coronary artery disease) 04/27/2012  . Heart palpitations 09/03/2011  . Dizziness 05/19/2011  . SOB (shortness of  breath) 05/05/2011  . MVP (mitral valve prolapse) 05/05/2011  . Mitral valve regurgitation 05/05/2011  . COPD (chronic obstructive pulmonary disease) ([place]) 05/05/2011  . Pulmonary HTN ([place]) 05/05/2011  . CHEST PAIN UNSPECIFIED 01/04/2011   Discharged Condition: good   History of Present Illness:  Michael Delacruz is a 74 yo white male with known history of mitral valve prolapse and severe mitral regurgitation, long-standing tobacco abuse with COPD, chronic dyspnea on exertion, and recently diagnosed persistent atrial fibrillation for which he underwent DC cardioversion.  He developed rheumatic fever as a child and has a murmur for most his of adult life.  He has been routinely followed by Dr. [name], who initially referred him to [facility] in 2012 for elective mitral valve repair which the patient declined.  However, over the past year the patient had noticed him symptoms of exertional shortness of breath, palpitations, and atypical chest pain had been progressing.  He was hospitalized in August for Atrial Fibrillation.  During that time he underwent Echocardiogram which showed Mitral Valve Prolapse with severe Mitral regurgitation.  He was treated with Eliquis and underwent cardioversion at that time.  Follow up with Dr. [name] in September showed the patient to be in Normal Sinus Rhythm.  He again encouraged the patient to seek surgical evaluation which the patient was agreeable to.  He was referred to [facility] and evaluated by Dr. Roxy Delacruz.  He was in agreement the patient should have surgical intervention of his heart valve.  Presurgical workup was completed, the risks and benefits of the procedure were explained to the patient, and the patient was agreeable to proceed with surgery.  Hospital Course:   The patient presented to [facility] on 10/22/2015.  He was taken to the operating room and underwent Minimally Invasive Mitral Valve Repair and MAZE procedure with clipping of Left Atrial Appendage.   The patient tolerated the procedure without difficulty and was taken to the SICU in stable condition.  During his stay in the SICU he was extubated on POD #1.  He was weaned off Milrinone and Phenylephrine drips as tolerated.  He was having issues with Bradycardia and required back up pacing.  He was treated aggressively with Duonebs and pulmonary toilet due to history of COPD.  He was diuresed for hypervolemia.  His arterial lines were removed without difficulty.  He was felt medically stable for transfer to the step down unit on POD #2.  The patient continued to make progress.  He initially continued to be bradycardic.  However, he is currently maintaining sinus rhythm with rates in the 70s.  He has been started on low dose beta blocker.  His chest tube output decreased and tubes were able to be removed on POD #6.  His pacing wires were also removed without difficulty.  He is on Coumadin for stroke prophylaxis post MAZE procedure.  His INR is currently at 1.78 and is trending up.  He will be discharged on 5 mg of Coumadin daily.  He is ambulating without difficulty.  He is tolerating a regular diet.  The patient is medically stable for discharge home on 10/29/2015.    Significant Diagnostic Studies: cardiac graphics: Echocardiogram:   - Left ventricle: The cavity size was normal. Wall thickness was increased in a pattern of mild LVH. Systolic function was normal. The estimated ejection fraction was in the range of 60% to 65%. Wall motion was normal; there were no regional wall motion abnormalities. - Aortic root: The aortic root was mildly dilated. - Mitral valve: Moderately calcified annulus. Severe , involving the posterior leaflet. There was moderate to severe regurgitation directed anteriorly and toward the septum. Valve area by pressure half-time: 2.22 cm^2. - Left atrium: The atrium was severely dilated.   Treatments: surgery:    Minimally-Invasive Mitral Valve  Repair Complex valvuloplasty including triangular resection of posterior leaflet Artificial Gore-tex neochord placement x6 Sorin Memo 3D Rechord Ring Annuloplasty (size 57mm, catalog [id number], serial [id number])   Minimally-Invasive Maze Procedure Complete bilateral atrial lesion set using cryothermy and bipolar radiofrequency ablation Clipping of Left Atrial Appendage (Atricure left atrial clip, size 45 mm)    Disposition: 01-Home or Self Care    Discharge Medications:  The patient has been discharged on:   1.Beta Blocker:  Yes [  ]                              No   [ x  ]                              If No, reason:  Patient intolerant - developed junctional rhythm  2.Ace Inhibitor/ARB: Yes [ x ]                                     No  [   ]  If No, reason:   3.Statin:   Yes [  ]                  No  [  x ]                  If No, reason:  Not clinically indicated  4.[place]:  Yes  [ x  ]                  No   [   ]                  If No, reason:     Medication List    STOP taking these medications        apixaban 5 MG Tabs tablet  Commonly known as:  ELIQUIS     diltiazem 120 MG 24 hr capsule  Commonly known as:  CARDIZEM CD     diltiazem 30 MG tablet  Commonly known as:  CARDIZEM     furosemide 40 MG tablet  Commonly known as:  LASIX     Potassium Chloride ER 20 MEQ Tbcr      TAKE these medications        ALPRAZolam 0.5 MG tablet  Commonly known as:  XANAX  Take 1 tablet (0.5 mg total) by mouth 3 (three) times daily as needed for anxiety.     amiodarone 200 MG tablet  Commonly known as:  PACERONE  Take 1 tablet (200 mg total) by mouth daily.     aspirin 81 MG EC tablet  Take 1 tablet (81 mg total) by mouth daily.     lisinopril 10 MG tablet  Commonly known as:  PRINIVIL,ZESTRIL  Take 1 tablet (10 mg total) by mouth daily.     metoprolol tartrate 25  MG tablet  Commonly known as:  LOPRESSOR  Take 0.5 tablets (12.5 mg total) by mouth 2 (two) times daily.     traMADol 50 MG tablet  Commonly known as:  ULTRAM  Take 1-2 tablets (50-100 mg total) by mouth every 4 (four) hours as needed for moderate pain.     warfarin 5 MG tablet  Commonly known as:  COUMADIN  Take 5 mg po daily or as directed by the Coumadin Clinic        Follow Up: Follow-up Information    Follow up with Rexene Alberts, MD On 11/10/2015.   Specialty:  Cardiothoracic Surgery   Why:  Have a chest x-ray at [place] at 2:30, then see MD at 3:30   Contact information:   [place] [address] [phone]       Follow up with Murray Hodgkins, NP On 11/11/2015.   Specialties:  Nurse Practitioner, Cardiology, Radiology   Why:  Appointment is at 2:30   Contact information:   [address] [place] [address] [phone]       Follow up with [facility] On 10/31/2015.   Specialty:  Cardiology   Why:  Please have bloodwork for Coumadin (PT/INR) drawn at cardiology office at 10:30   Contact information:   [address], [place] [phone]      Signed: Burke Keels 10/29/2015, 8:27 AM  I have seen and examined the patient and agree with the assessment and plan as outlined.  WIll stop metoprolol since the patient has developed accelerated junctional rhythm since it was started  yesterday.    Rexene Alberts, MD 10/29/2015 8:52 AM

## 2015-10-28 NOTE — Progress Notes (Signed)
Pt ambulated back to room from visiting family member in 2W20 with rolling walker and standby assist. He tolerated it well. Assisted to bed per his request, medicated for chest and neck discomfort.

## 2015-10-29 ENCOUNTER — Inpatient Hospital Stay (HOSPITAL_COMMUNITY): Payer: Medicare HMO

## 2015-10-29 LAB — BASIC METABOLIC PANEL
Anion gap: 10 (ref 5–15)
BUN: 8 mg/dL (ref 6–20)
CO2: 28 mmol/L (ref 22–32)
Calcium: 8.9 mg/dL (ref 8.9–10.3)
Chloride: 99 mmol/L — ABNORMAL LOW (ref 101–111)
Creatinine, Ser: 0.85 mg/dL (ref 0.61–1.24)
GFR calc Af Amer: >60 mL/min (ref >60)
GFR calc non Af Amer: >60 mL/min (ref >60)
Glucose, Bld: 124 mg/dL — ABNORMAL HIGH (ref 65–99)
Potassium: 4 mmol/L (ref 3.5–5.1)
Sodium: 137 mmol/L (ref 135–145)

## 2015-10-29 LAB — PROTIME-INR
INR: 1.78 — ABNORMAL HIGH (ref 0.00–1.49)
Prothrombin Time: 20.7 seconds — ABNORMAL HIGH (ref 11.6–15.2)

## 2015-10-29 MED ORDER — ASPIRIN 81 MG PO TBEC: 81.0000 mg | DELAYED_RELEASE_TABLET | Freq: Every day | ORAL | Status: DC

## 2015-10-29 MED ORDER — ALPRAZOLAM 0.5 MG PO TABS: 0.5000 mg | ORAL_TABLET | Freq: Three times a day (TID) | ORAL | Status: DC | PRN

## 2015-10-29 MED ORDER — WARFARIN SODIUM 5 MG PO TABS: ORAL_TABLET | ORAL | Status: DC

## 2015-10-29 MED ORDER — AMIODARONE HCL 200 MG PO TABS: 200.0000 mg | ORAL_TABLET | Freq: Every day | ORAL | Status: DC

## 2015-10-29 MED ORDER — TRAMADOL HCL 50 MG PO TABS: 50.0000 mg | ORAL_TABLET | ORAL | Status: DC | PRN

## 2015-10-29 MED ORDER — LISINOPRIL 10 MG PO TABS: 10.0000 mg | ORAL_TABLET | Freq: Every day | ORAL | Status: DC

## 2015-10-29 MED ORDER — METOPROLOL TARTRATE 25 MG PO TABS: 12.5000 mg | ORAL_TABLET | Freq: Two times a day (BID) | ORAL | Status: DC

## 2015-10-29 NOTE — Care Management Note (Addendum)
Case Management Note  Patient Details  Name: Michael Delacruz MRN: [id number] Date of Birth: 1941-09-21  Subjective/Objective:       Pt admitted is s/p MVR and maze             Action/Plan:  Pt is independent from home with wife.  Pts wife will provide support as required at home.  Pt request RW for home discharge.     Expected Discharge Date:   (pending)               Expected Discharge Plan:  Home/Self Care  In-House Referral:     Discharge planning Services  CM Consult  Post Acute Care Choice:    Choice offered to:     DME Arranged:  Walker rolling DME Agency:  [place]:    [place]:     Status of Service:  Completed, signed off  Medicare Important Message Given:  Yes-third notification given Date Medicare IM Given:    Medicare IM give by:    Date Additional Medicare IM Given:    Additional Medicare Important Message give by:     If discussed at [place] of Stay Meetings, dates discussed:    Additional Comments: CM assessed pt, cardiac rehab at bedside during assessment.  CM offered pt choice for DME, pt chose [facility], agency contacted and referral accepted. Maryclare Labrador, RN 10/29/2015, 10:16 AM

## 2015-10-29 NOTE — Progress Notes (Signed)
CARDIAC REHAB PHASE I   PRE:  Rate/Rhythm: 61  BP:  Sitting: 166/68        SaO2: 96 RA  MODE:  Ambulation: 490 ft   POST:  Rate/Rhythm: 67  BP:  Sitting: 124/81         SaO2: 94 RA  Pt ambulated 490 ft on RA, rolling walker, hand held assist, steady gait, tolerated well. Pt continues to have DOE, denies pain, dizziness, declined rest stop. Post-surgery discharge education completed. Reviewed risk factors, IS, activity restrictions, activity progression, exercise, heart healthy diet, sodium restrictions, and hase 2 cardiac rehab. Pt verbalized understanding, states he does not want to change. Pt agrees to phase 2 cardiac rehab referral, will send to [facility]. Pt to bed per pt request after walk, call bell within reach.   [phone]  Lenna Sciara, RN, BSN 10/29/2015 10:40 AM

## 2015-10-29 NOTE — Progress Notes (Addendum)
[address]       [place] [address]             [phone]          7 Days Post-Op Procedure(s) (LRB): MINIMALLY INVASIVE MITRAL VALVE REPAIR (MVR) (Right) MINIMALLY INVASIVE MAZE PROCEDURE (N/A) TRANSESOPHAGEAL ECHOCARDIOGRAM (TEE) (N/A)  Subjective: Feels well, no complaints.   Objective: Vital signs in last 24 hours: Patient Vitals for the past 24 hrs:  BP Temp Temp src Pulse Resp SpO2 Weight  10/29/15 0650 125/69 mmHg 97.5 F (36.4 C) Oral 63 18 97 % 198 lb 1.6 oz (89.858 kg)  10/28/15 1944 115/66 mmHg 98 F (36.7 C) Oral 73 18 91 % -  10/28/15 1300 122/77 mmHg - - 66 18 95 % -  10/28/15 1150 127/70 mmHg - - 68 18 96 % -  10/28/15 1120 119/72 mmHg - - 67 - - -  10/28/15 1105 126/82 mmHg - - 71 - - -  10/28/15 1050 120/77 mmHg - - 70 - - -  10/28/15 1035 120/81 mmHg - - 73 - - -  10/28/15 1017 116/79 mmHg - - 73 20 95 % -  10/28/15 1000 137/79 mmHg - - 77 20 96 % -   Current Weight  10/29/15 198 lb 1.6 oz (89.858 kg)  BASELINE WEIGHT:92 kg   Intake/Output from previous day: 11/08 0701 - 11/09 0700 In: 720 [P.O.:720] Out: 825 [Urine:825]    PHYSICAL EXAM:  Heart: RRR Lungs: Clear Wound: Clean and dry Extremities: No significant LE edema    Lab Results: CBC: Recent Labs  10/27/15 0430  WBC 5.5  HGB 10.9*  HCT 32.6*  PLT 132*   BMET:  Recent Labs  10/27/15 0430 10/29/15 0350  NA 137 137  K 3.3* 4.0  CL 99* 99*  CO2 28 28  GLUCOSE 110* 124*  BUN 9 8  CREATININE 0.98 0.85  CALCIUM 8.3* 8.9    PT/INR:  Recent Labs  10/29/15 0350  LABPROT 20.7*  INR 1.78*   CXR: FINDINGS: Interim removal of right chest tubes. Tiny right apical pneumothorax. Mediastinum and hilar structures are normal. Prior cardiac valve replacement. Left atrial appendage clip. Cardiomegaly with normal pulmonary vascularity. Low lung volumes with mild right base subsegmental atelectasis. No significant pleural effusion. Mild right  chest wall subcutaneous emphysema .  IMPRESSION: 1. Interim removal of right chest tubes. Tiny right apical pneumothorax noted. 2. Cardiac valve replacement. Left atrial appendage clip noted. Stable cardiomegaly. No pulmonary venous congestion. 3. Low lung volumes with mild right base atelectasis. 4. Mild right chest wall subcutaneous emphysema. Critical Value/emergent results were called by telephone at the time of interpretation on 10/29/2015 at 7:38 am to nurse [facility], who verbally acknowledged these results.   Assessment/Plan: S/P Procedure(s) (LRB): MINIMALLY INVASIVE MITRAL VALVE REPAIR (MVR) (Right) MINIMALLY INVASIVE MAZE PROCEDURE (N/A) TRANSESOPHAGEAL ECHOCARDIOGRAM (TEE) (N/A)  CV- Had some PVCs and bigeminy yesterday, but is SR/Acc junctional this am. BPs stable. Continue Amio, Lopressor, Lisinopril, Coumadin.  Hypokalemia- resolved.  CXR stable.  Disp- plan d/c home today, instructions reviewed with patient.   LOS: 7 days    COLLINS,GINA H 10/29/2015  I have seen and examined the patient and agree with the assessment and plan as outlined.  D/C home today.  Will stop metoprolol since the patient developed junctional rhythm since it was started.  Weight now below baseline and no signs of fluid overload on exam.  Will stop lasix and  potassium.  I have instructed patient to record his weight every day and watch for signs of fluid retention so that a diuretic could be resumed if necessary.  Rexene Alberts, MD 10/29/2015 8:39 AM

## 2015-10-31 ENCOUNTER — Encounter (INDEPENDENT_AMBULATORY_CARE_PROVIDER_SITE_OTHER): Payer: Self-pay

## 2015-10-31 ENCOUNTER — Ambulatory Visit (INDEPENDENT_AMBULATORY_CARE_PROVIDER_SITE_OTHER): Payer: Medicare HMO | Admitting: *Deleted

## 2015-10-31 DIAGNOSIS — Z9889 Other specified postprocedural states: Secondary | ICD-10-CM

## 2015-10-31 DIAGNOSIS — Z8679 Personal history of other diseases of the circulatory system: Secondary | ICD-10-CM

## 2015-10-31 DIAGNOSIS — I4891 Unspecified atrial fibrillation: Secondary | ICD-10-CM

## 2015-10-31 DIAGNOSIS — Z7901 Long term (current) use of anticoagulants: Secondary | ICD-10-CM

## 2015-10-31 LAB — POCT INR: INR: 2.6

## 2015-10-31 NOTE — Patient Instructions (Signed)

## 2015-11-05 ENCOUNTER — Encounter: Payer: Self-pay | Admitting: Emergency Medicine

## 2015-11-05 ENCOUNTER — Ambulatory Visit (INDEPENDENT_AMBULATORY_CARE_PROVIDER_SITE_OTHER): Payer: Medicare HMO

## 2015-11-05 ENCOUNTER — Emergency Department
Admission: EM | Admit: 2015-11-05 | Discharge: 2015-11-05 | Disposition: A | Discharge status: Home / Self Care | Payer: Medicare HMO | Attending: Emergency Medicine | Admitting: Emergency Medicine

## 2015-11-05 ENCOUNTER — Emergency Department: Payer: Medicare HMO

## 2015-11-05 DIAGNOSIS — I4891 Unspecified atrial fibrillation: Secondary | ICD-10-CM

## 2015-11-05 DIAGNOSIS — Z7901 Long term (current) use of anticoagulants: Secondary | ICD-10-CM

## 2015-11-05 DIAGNOSIS — Z7982 Long term (current) use of aspirin: Secondary | ICD-10-CM | POA: Insufficient documentation

## 2015-11-05 DIAGNOSIS — H539 Unspecified visual disturbance: Secondary | ICD-10-CM

## 2015-11-05 DIAGNOSIS — I10 Essential (primary) hypertension: Secondary | ICD-10-CM | POA: Diagnosis not present

## 2015-11-05 DIAGNOSIS — Z8679 Personal history of other diseases of the circulatory system: Secondary | ICD-10-CM | POA: Diagnosis not present

## 2015-11-05 DIAGNOSIS — Z9889 Other specified postprocedural states: Secondary | ICD-10-CM

## 2015-11-05 DIAGNOSIS — Z79899 Other long term (current) drug therapy: Secondary | ICD-10-CM | POA: Insufficient documentation

## 2015-11-05 DIAGNOSIS — F17210 Nicotine dependence, cigarettes, uncomplicated: Secondary | ICD-10-CM | POA: Insufficient documentation

## 2015-11-05 DIAGNOSIS — H538 Other visual disturbances: Secondary | ICD-10-CM | POA: Diagnosis not present

## 2015-11-05 DIAGNOSIS — H547 Unspecified visual loss: Secondary | ICD-10-CM | POA: Diagnosis present

## 2015-11-05 LAB — POCT INR: INR: 6

## 2015-11-05 MED ORDER — TETRACAINE HCL 0.5 % OP SOLN
2.0000 [drp] | Freq: Once | OPHTHALMIC | Status: AC
  Administered 2015-11-05: 2 [drp] via OPHTHALMIC

## 2015-11-05 MED ORDER — FLUORESCEIN SODIUM 1 MG OP STRP
ORAL_STRIP | OPHTHALMIC | Status: AC
  Administered 2015-11-05: 1 via OPHTHALMIC
  Filled 2015-11-05: qty 1

## 2015-11-05 MED ORDER — TETRACAINE HCL 0.5 % OP SOLN
OPHTHALMIC | Status: AC
  Administered 2015-11-05: 2 [drp] via OPHTHALMIC
  Filled 2015-11-05: qty 2

## 2015-11-05 MED ORDER — FLUORESCEIN SODIUM 1 MG OP STRP
1.0000 | ORAL_STRIP | Freq: Once | OPHTHALMIC | Status: AC
  Administered 2015-11-05: 1 via OPHTHALMIC

## 2015-11-05 NOTE — ED Provider Notes (Signed)
[facility] Emergency Department Provider Note    ____________________________________________  Time seen: 1915  I have reviewed the triage vital signs and the nursing notes.   HISTORY  Chief Complaint Loss of Vision   History limited by: Not Limited   HPI Michael Delacruz is a 74 y.o. male who comes to the emergency department today because of concerns for right eye vision change. The patient states that the symptoms started roughly 4 hours ago. The patient was at his INR check appointment when he noticed his right vision becoming blurry. He states that he does have baseline right vision problems secondary to a retinal artery occlusion that occurred 4 years ago. He states that right when the pain started he had a couple brief episodes of sharp discomfort however now denies any pain in his eye. He states that the blurriness has been constant. Of note the patient's INR was 6 today. He was instructed to hold today and tomorrow's dose and will go on a reduced dose following the medication hold.   Past Medical History  Diagnosis Date  . COPD (chronic obstructive pulmonary disease) ([place])   . History of blood clots     eye   . Dilation of intestine 01/2015  . Asthma   . Severe mitral regurgitation   . Atrial fibrillation, persistent ([place]) 07/2015  . History of rheumatic fever   . Tobacco abuse   . Chronic diastolic congestive heart failure ([place])   . Anginal pain ([place])   . Hypertension   . Dysrhythmia   . Heart murmur   . Stroke ([place])   . Kidney stone   . History of hiatal hernia   . GERD (gastroesophageal reflux disease)   . Headache   . Arthritis   . Fibromyalgia   . S/P minimally invasive mitral valve repair 10/22/2015    Complex valvuloplasty including triangular resection of posterior leaflet, artificial Gore-tex neochord placement x6 and 38 mm Sorin Memo 3D Rechord ring annuloplasty via right minithoracotomy approach  . S/P Minimally invasive maze  operation for atrial fibrillation 10/22/2015    Complete bilateral atrial lesion set using cryothermy and bipolar radiofrequency ablation with clipping of LA appendage via right mini thoracotomy approach    Patient Active Problem List   Diagnosis Date Noted  . Long term (current) use of anticoagulants [Z79.01] 10/31/2015  . S/P minimally invasive mitral valve repair + maze procedure 10/22/2015  . S/P Minimally invasive maze operation for atrial fibrillation 10/22/2015  . Atrial fibrillation, unspecified 10/06/2015  . Angina pectoris ([place]) 10/03/2015  . Chronic diastolic congestive heart failure ([place])   . Atrial fibrillation ([place]) 08/15/2015  . Atrial fibrillation with RVR ([place])   . Centrilobular emphysema ([place])   . Shortness of breath   . Hypotension 11/02/2012  . Stress at home 11/02/2012  . Leg pain 11/02/2012  . Smoking 04/27/2012  . Hyperlipidemia 04/27/2012  . CAD (coronary artery disease) 04/27/2012  . Heart palpitations 09/03/2011  . Dizziness 05/19/2011  . SOB (shortness of breath) 05/05/2011  . MVP (mitral valve prolapse) 05/05/2011  . Mitral valve regurgitation 05/05/2011  . COPD (chronic obstructive pulmonary disease) ([place]) 05/05/2011  . Pulmonary HTN ([place]) 05/05/2011  . CHEST PAIN UNSPECIFIED 01/04/2011    Past Surgical History  Procedure Laterality Date  . Ankle surgery    . Tee without cardioversion N/A 08/18/2015    Procedure: TRANSESOPHAGEAL ECHOCARDIOGRAM (TEE);  Surgeon: Wellington Hampshire, MD;  Location: [facility] ORS;  Service: Cardiovascular;  Laterality: N/A;  .  Electrophysiologic study N/A 08/18/2015    Procedure: CARDIOVERSION;  Surgeon: Wellington Hampshire, MD;  Location: [facility] ORS;  Service: Cardiovascular;  Laterality: N/A;  . Cardiac catheterization N/A 10/03/2015    Procedure: Right and Left Heart Cath and Coronary Angiography;  Surgeon: [name], MD;  Location: [place] [facility] CV LAB;  Service: Cardiovascular;  Laterality: N/A;  . Colonoscopy    . Sinus  exploration    . Mitral valve repair Right 10/22/2015    Procedure: MINIMALLY INVASIVE MITRAL VALVE REPAIR (MVR);  Surgeon: Rexene Alberts, MD;  Location: [place];  Service: Open Heart Surgery;  Laterality: Right;  . Minimally invasive maze procedure N/A 10/22/2015    Procedure: MINIMALLY INVASIVE MAZE PROCEDURE;  Surgeon: Rexene Alberts, MD;  Location: [place];  Service: Open Heart Surgery;  Laterality: N/A;  . Tee without cardioversion N/A 10/22/2015    Procedure: TRANSESOPHAGEAL ECHOCARDIOGRAM (TEE);  Surgeon: Rexene Alberts, MD;  Location: [place];  Service: Open Heart Surgery;  Laterality: N/A;    Current Outpatient Rx  Name  Route  Sig  Dispense  Refill  . ALPRAZolam (XANAX) 0.5 MG tablet   Oral   Take 1 tablet (0.5 mg total) by mouth 3 (three) times daily as needed for anxiety.   30 tablet   0   . amiodarone (PACERONE) 200 MG tablet   Oral   Take 1 tablet (200 mg total) by mouth daily.   30 tablet   0   . aspirin EC 81 MG EC tablet   Oral   Take 1 tablet (81 mg total) by mouth daily.         [name] lisinopril (PRINIVIL,ZESTRIL) 10 MG tablet   Oral   Take 1 tablet (10 mg total) by mouth daily.   30 tablet   1   . traMADol (ULTRAM) 50 MG tablet   Oral   Take 1-2 tablets (50-100 mg total) by mouth every 4 (four) hours as needed for moderate pain.   30 tablet   0   . warfarin (COUMADIN) 5 MG tablet      Take 5 mg po daily or as directed by the [facility]in Clinic   60 tablet   1     Allergies Codeine; Macrodantin; and Morphine and related  Family History  Problem Relation Age of Onset  . Family history unknown: Yes    Social History Social History  Substance Use Topics  . Smoking status: Current Every Day Smoker -- 0.50 packs/day for 52 years    Types: Cigarettes  . Smokeless tobacco: None  . Alcohol Use: 0.5 oz/week    1 Standard drinks or equivalent per week     Comment: rare    Review of Systems  Constitutional: Negative for fever. Cardiovascular:  Negative for chest pain. Respiratory: Negative for shortness of breath. Gastrointestinal: Negative for abdominal pain, vomiting and diarrhea. Neurological: Negative for headaches, focal weakness or numbness. Right vision change.  10-point ROS otherwise negative.  ____________________________________________   PHYSICAL EXAM:  VITAL SIGNS: ED Triage Vitals  Enc Vitals Group     BP 11/05/15 1641 119/73 mmHg     Pulse Rate 11/05/15 1641 71     Resp 11/05/15 1641 18     Temp 11/05/15 1641 98.3 F (36.8 C)     Temp src --      SpO2 11/05/15 1641 96 %     Weight 11/05/15 1641 195 lb (88.451 kg)     Height 11/05/15 1641 6'  1" (1.854 m)     Head Cir --      Peak Flow --      Pain Score 11/05/15 1641 0   Constitutional: Alert and oriented. Well appearing and in no distress. Eyes: Conjunctivae are normal. PERRL. Normal extraocular movements. No corneal abrasions on fluorescein testing. Somewhat decreased red reflex in the right eye. Pressures ranging between 26-31 in the right eye. Left eye pressures of 16. Funduscopic exam with clear veins and arteries and sharp optic disc margin on the left eye. Right eye I was unable to focus on the arteries and veins. Patient was able to distinguish my facial features at roughly arm length distance. ENT   Head: Normocephalic and atraumatic.   Nose: No congestion/rhinnorhea.   Mouth/Throat: Mucous membranes are moist.   Neck: No stridor. Hematological/Lymphatic/Immunilogical: No cervical lymphadenopathy. Cardiovascular: Normal rate, regular rhythm.  No murmurs, rubs, or gallops. Respiratory: Normal respiratory effort without tachypnea nor retractions. Breath sounds are clear and equal bilaterally. No wheezes/rales/rhonchi. Musculoskeletal: Normal range of motion in all extremities. No joint effusions.  No lower extremity tenderness nor edema. Neurologic:  Normal speech and language. No gross focal neurologic deficits are appreciated.   Skin:  Skin is warm, dry and intact. No rash noted. Psychiatric: Mood and affect are normal. Speech and behavior are normal. Patient exhibits appropriate insight and judgment.  ____________________________________________    LABS (pertinent positives/negatives)  None  ____________________________________________   EKG  None  ____________________________________________    RADIOLOGY  CT head IMPRESSION: Mild atrophic changes without acute abnormality.   ____________________________________________   PROCEDURES  Procedure(s) performed: None  Critical Care performed: No  ____________________________________________   INITIAL IMPRESSION / ASSESSMENT AND PLAN / ED COURSE  Pertinent labs & imaging results that were available during my care of the patient were reviewed by me and considered in my medical decision making (see chart for details).  Patient presents to the emergency department today with concerns for right eye vision Lorena's which is a change. Exam does show mildly increased pressure of that right eye. Decreased red reflex. In conjunction patient's INR is quite elevated today. I think likely patient suffering from a vitreous hemorrhage. I doubt retinal artery occlusion given painless exam. I discussed with Dr. Oval Linsey of ophthalmology who will see patient in clinic tomorrow morning.  ____________________________________________   FINAL CLINICAL IMPRESSION(S) / ED DIAGNOSES  Final diagnoses:  Vision changes     Nance Pear, MD 11/05/15 2027

## 2015-11-05 NOTE — ED Notes (Signed)
Pt to ER states he was getting out of car at PCP and lost vision in right eye. Pt states previous CVA with same type symptoms.

## 2015-11-05 NOTE — Discharge Instructions (Signed)
Please seek medical attention for any high fevers, chest pain, shortness of breath, change in behavior, persistent vomiting, bloody stool or any other new or concerning symptoms.  

## 2015-11-07 ENCOUNTER — Telehealth: Payer: Self-pay

## 2015-11-07 ENCOUNTER — Other Ambulatory Visit: Payer: Self-pay | Admitting: Thoracic Surgery (Cardiothoracic Vascular Surgery)

## 2015-11-07 DIAGNOSIS — I272 Pulmonary hypertension, unspecified: Secondary | ICD-10-CM

## 2015-11-07 NOTE — Telephone Encounter (Signed)
Spoke with pt's wife, she states pt did go to the ED on 11/05/15 as I instructed at [facility] appt for the vision disturbance in his L eye.  She states pt was referred to [facility] for a next day appt, eye was examined on 11/06/15 and pt had blood behind his eye and occular pressure was 31 vs pressure of 13 in the other eye.  Pt was started on 3 eye drops to treat.  Pt's INR was 6.0 at Noonday on 11/05/15, pt has held Coumadin x 2 dosages, concerned about restarting today secondary to blood behind eye.  She has a call into Dr Ricard Dillon the surgeons office, but he is out of state at a meeting.  Advised pt wife OK to hold Coumadin x 1 more dosage, but do recommend he resume Coumadin tomorrow to prevent a clot or stroke, unless contraindicated by opthalmologist today at [place] to reassess eye.  Call back with further questions or problems.

## 2015-11-10 ENCOUNTER — Ambulatory Visit
Admission: RE | Admit: 2015-11-10 | Discharge: 2015-11-10 | Disposition: A | Discharge status: Home / Self Care | Payer: Medicare HMO | Source: Ambulatory Visit | Attending: Thoracic Surgery (Cardiothoracic Vascular Surgery) | Admitting: Thoracic Surgery (Cardiothoracic Vascular Surgery)

## 2015-11-10 ENCOUNTER — Ambulatory Visit (INDEPENDENT_AMBULATORY_CARE_PROVIDER_SITE_OTHER): Payer: Self-pay | Admitting: Physician Assistant

## 2015-11-10 VITALS — BP 108/71 | HR 68 | Resp 16 | Ht 73.0 in | Wt 196.0 lb

## 2015-11-10 DIAGNOSIS — Z9889 Other specified postprocedural states: Secondary | ICD-10-CM

## 2015-11-10 DIAGNOSIS — Z8679 Personal history of other diseases of the circulatory system: Secondary | ICD-10-CM

## 2015-11-10 DIAGNOSIS — I4891 Unspecified atrial fibrillation: Secondary | ICD-10-CM

## 2015-11-10 DIAGNOSIS — I272 Pulmonary hypertension, unspecified: Secondary | ICD-10-CM

## 2015-11-10 DIAGNOSIS — I340 Nonrheumatic mitral (valve) insufficiency: Secondary | ICD-10-CM

## 2015-11-10 MED ORDER — ALPRAZOLAM 0.5 MG PO TABS: 0.5000 mg | ORAL_TABLET | Freq: Three times a day (TID) | ORAL | Status: DC | PRN

## 2015-11-10 NOTE — Progress Notes (Signed)
[address]       [place] [address]             [phone]          HPI: Patient returns for routine postoperative follow-up having undergone minimally invasive mitral valve repair, Maze procedure and left atrial clip by Dr. Roxy Manns on 10/22/2015. The patient's postoperative course was notable for bradycardia, which resolved over time, although he was not discharged home on a beta blocker due to accelerated junctional rhythm. He did not have any postop atrial fibrillation.  He remained stable and was able to be discharged home on 10/29/2015 in good condition.  Initially post-discharge, the patient was progressing well. He was having little pain and walking his dog daily.  On 11/16, he developed acute onset right eye pain with loss of vision. The patient was seen in the ER at [facility], where he was noted to have a supratherapeutic INR at 6.0.  CT of the head was negative. Coumadin was held and he was referred to [facility] for evaluation. He was told that he had "bleeding behind his eye" with elevated intraocular pressure and was started on 3 types of eye drops.  Since that episode, the patient states he has become progressively weaker.  He continues to have very blurry vision on the right.  He loses his balance easily and becomes dizzy with activity.  His appetite is poor and he is not eating, drinking or walking much. He has been taking Xanax to help him sleep.  He denies chest pain, shortness of breath or lower extremity edema.  Coumadin and aspirin were restarted yesterday at 2.5 mg, to alternate with 5 mg every other day.  He has cardiology follow up, an INR check and ophthalmologist visit tomorrow.     Current Outpatient Prescriptions  Medication Sig Dispense Refill  . ALPRAZolam (XANAX) 0.5 MG tablet Take 1 tablet (0.5 mg total) by mouth 3 (three) times daily as needed for anxiety or sleep. 30 tablet 0  . amiodarone (PACERONE) 200 MG tablet Take 1 tablet  (200 mg total) by mouth daily. 30 tablet 0  . aspirin EC 81 MG EC tablet Take 1 tablet (81 mg total) by mouth daily.    [name] lisinopril (PRINIVIL,ZESTRIL) 10 MG tablet Take 1 tablet (10 mg total) by mouth daily. 30 tablet 1  . traMADol (ULTRAM) 50 MG tablet Take 1-2 tablets (50-100 mg total) by mouth every 4 (four) hours as needed for moderate pain. 30 tablet 0  . warfarin (COUMADIN) 5 MG tablet Take 5 mg po daily or as directed by the Coumadin Clinic 60 tablet 1   No current facility-administered medications for this visit.     Physical Exam: BP 108/71 HR 68 Resp 16 Wounds: Right chest and left groin wounds are healing well with no erythema or drainage Heart: Regular rate and rhythm with no murmurs Lungs: Clear to auscultation Extremities: No edema   Diagnostic Tests: Chest xray: Dg Chest 2 View  11/10/2015  CLINICAL DATA:  Chest pain. Recent valve replacement. Hypertension. EXAM: CHEST  2 VIEW COMPARISON:  October 29, 2015 FINDINGS: There is mild bibasilar lung scarring. There is no frank edema or consolidation. The heart is borderline prominent with pulmonary vascularity within normal limits. Patient is status post mitral valve replacement. There is a left atrial appendage clamp. There is evidence of prior trauma involving the right seventh rib posteriorly. No apparent pneumothorax. IMPRESSION: Areas of mild scarring.  No frank edema or consolidation. No pneumothorax. No change in cardiac silhouette. Electronically Signed   By: Lowella Grip III M.D.   On: 11/10/2015 15:40       Assessment/Plan: Status post minimally invasive MV repair/Maze/Left atrial clip-  The patient's rhythm strip today shows sinus rhythm with occasional PACs.  Blood pressure is low normal.  Most concerning is his recent retinal hemorrhage with supratherapeutic INR. This was discussed with Dr. Roxy Manns, who also saw the patient today.  This is a difficult dilemma in light of his history of stroke with a-fib.  The  patient is asked to hold the aspirin and continue Coumadin as directed and follow up for INR check as scheduled tomorrow.  We have also discontinued his Lisinopril in light of his orthostatic type symptoms and low normal BP.  He has a cardiology follow up tomorrow and may need an echo to rule out pericardial effusion. Dr. Roxy Manns plans to discuss our findings with Dr. [name]. The patient also has a follow up with his eye doctor tomorrow to recheck his retinal bleeding. He is encouraged to ambulate daily to improve his strength and mobility and increase his po intake. I have also refilled his Xanax, which he is taking for sleep.  We will see him back in 3 weeks for a recheck.

## 2015-11-11 ENCOUNTER — Encounter: Payer: Medicare HMO | Admitting: Nurse Practitioner

## 2015-11-11 ENCOUNTER — Ambulatory Visit (INDEPENDENT_AMBULATORY_CARE_PROVIDER_SITE_OTHER): Payer: Medicare HMO

## 2015-11-11 ENCOUNTER — Encounter: Payer: Self-pay | Admitting: Cardiovascular Disease

## 2015-11-11 ENCOUNTER — Ambulatory Visit (INDEPENDENT_AMBULATORY_CARE_PROVIDER_SITE_OTHER): Payer: Medicare HMO | Admitting: Cardiovascular Disease

## 2015-11-11 ENCOUNTER — Ambulatory Visit: Payer: Medicare HMO | Admitting: Cardiovascular Disease

## 2015-11-11 VITALS — BP 100/62 | HR 67 | Ht 73.0 in | Wt 193.8 lb

## 2015-11-11 DIAGNOSIS — I341 Nonrheumatic mitral (valve) prolapse: Secondary | ICD-10-CM

## 2015-11-11 DIAGNOSIS — Z8679 Personal history of other diseases of the circulatory system: Secondary | ICD-10-CM | POA: Diagnosis not present

## 2015-11-11 DIAGNOSIS — Z7901 Long term (current) use of anticoagulants: Secondary | ICD-10-CM | POA: Diagnosis not present

## 2015-11-11 DIAGNOSIS — I209 Angina pectoris, unspecified: Secondary | ICD-10-CM | POA: Diagnosis not present

## 2015-11-11 DIAGNOSIS — I4891 Unspecified atrial fibrillation: Secondary | ICD-10-CM

## 2015-11-11 DIAGNOSIS — Z720 Tobacco use: Secondary | ICD-10-CM

## 2015-11-11 DIAGNOSIS — Z9889 Other specified postprocedural states: Secondary | ICD-10-CM

## 2015-11-11 DIAGNOSIS — J449 Chronic obstructive pulmonary disease, unspecified: Secondary | ICD-10-CM

## 2015-11-11 DIAGNOSIS — F17200 Nicotine dependence, unspecified, uncomplicated: Secondary | ICD-10-CM

## 2015-11-11 LAB — POCT INR: INR: 1.7

## 2015-11-11 MED ORDER — CLOPIDOGREL BISULFATE 75 MG PO TABS: 75.0000 mg | ORAL_TABLET | Freq: Every day | ORAL | Status: DC

## 2015-11-11 MED ORDER — ASPIRIN EC 81 MG PO TBEC: 81.0000 mg | DELAYED_RELEASE_TABLET | Freq: Every day | ORAL | Status: DC

## 2015-11-11 NOTE — Assessment & Plan Note (Signed)
Currently denies any chest pain concerning for angina

## 2015-11-11 NOTE — Assessment & Plan Note (Signed)
Long history of mitral valve prolapse, severe MR, recent repair Baseline echo ordered

## 2015-11-11 NOTE — Assessment & Plan Note (Signed)
We have encouraged him to continue to work on weaning his cigarettes and smoking cessation. He will continue to work on this and does not want any assistance with chantix.  

## 2015-11-11 NOTE — Assessment & Plan Note (Signed)
Doing well after his surgery,  General malaise concerning for underlying depression No changes to his medications, blood pressure continues to run low Encouraged him to increase his fluid and food intake We have ordered baseline echocardiogram following his surgery

## 2015-11-11 NOTE — Assessment & Plan Note (Signed)
Stable breathing No recent COPD exacerbation

## 2015-11-11 NOTE — Progress Notes (Signed)
Patient ID: Michael Delacruz, male    DOB: [birth year], 74 y.o.   MRN: [id number]  HPI Comments: Michael Delacruz is a 74 year old gentleman with a reported history of rheumatic fever as a child in his late teenage years, a murmur for several decades, long smoking history for 50 years with underlying COPD, with worsening shortness of breath when he was first seen in clinic, severe mitral valve regurgitation on echocardiogram, prolapse of posterior leaflet, moderate pulmonary hypertension on initial echocardiogram who presents for Routine followup of his severe MR, s/p successful MR repair by right thoracotomy by Michael Delacruz one month ago, November 2016, no significant postoperative complications  In follow-up today, he reports having bleeding in his right eye, INR was greater than 5 Seen by ophthalmology, symptoms slowly improving, INR today 1.7 Still with significant blurry vision in the right He does report having baseline peripheral vision problems in the right prior to any surgery  He reports his energy is poor, has not been exercising very much, general malaise, tired Family reports he has not been eating or drinking much No significant pain control issues, just does not feel like doing anything Wife reports he is emotional, tearful at times Lisinopril recently held for low blood pressure  He has expressed his desire to stop warfarin  EKG on today's visit shows no sinus rhythm with rate 67 bpm, diffuse  ST and T wave abnormality V3 through V6, 1 and aVL  Other past medical history Hospitalizationt 2016 at [facility] for paroxysmal atrial fibrillation. After rate control, he had TEE cardioversion and was discharged on diltiazem, and coagulation.  Previously had bradycardia on metoprolol was taking 12.5 mg in the morning  previousdramatic improvement in his weight and diet with drop of his cholesterol from 240 to 170. Cholesterol now back up to 260. He refused cholesterol pill  Repeat echocardiogram  2013 shows mildly dilated left ventricle at end systole, left ventricular size is less than 4 cm in diastole,   normal LV function estimated at >60%, moderate valve prolapse of the posterior leaflet with moderate to severe mitral valve regurgitation that is the centric and directed towards the septum, right ventricular systolic pressure estimated at 50 mm mercury  He had a cardiac catheterization  that showed severe mitral valve regurgitation, 30% mid and 30% proximal LAD disease, 50% mid circumflex disease ejection fraction 50%.   Allergies  Allergen Reactions  . Codeine Nausea Only  . Macrodantin [Nitrofurantoin Macrocrystal] Rash  . Morphine And Related Rash    Outpatient Encounter Prescriptions as of 11/11/2015  Medication Sig  . ALPRAZolam (XANAX) 0.5 MG tablet Take 1 tablet (0.5 mg total) by mouth 3 (three) times daily as needed for anxiety or sleep.  [name] amiodarone (PACERONE) 200 MG tablet Take 1 tablet (200 mg total) by mouth daily.  . bimatoprost (LUMIGAN) 0.01 % SOLN Place 1 drop into the right eye at bedtime.  . brimonidine (ALPHAGAN P) 0.1 % SOLN 1 drop.  . Difluprednate (DUREZOL) 0.05 % EMUL Apply to eye daily at 12 noon.  . traMADol (ULTRAM) 50 MG tablet Take 1-2 tablets (50-100 mg total) by mouth every 4 (four) hours as needed for moderate pain.  . [DISCONTINUED] warfarin (COUMADIN) 5 MG tablet Take 5 mg po daily or as directed by the [facility]  . aspirin EC 81 MG tablet Take 1 tablet (81 mg total) by mouth daily.  . clopidogrel (PLAVIX) 75 MG tablet Take 1 tablet (75 mg total) by mouth daily.  . [  DISCONTINUED] aspirin EC 81 MG EC tablet Take 1 tablet (81 mg total) by mouth daily. (Patient not taking: Reported on 11/11/2015)  . [DISCONTINUED] lisinopril (PRINIVIL,ZESTRIL) 10 MG tablet Take 1 tablet (10 mg total) by mouth daily. (Patient not taking: Reported on 11/11/2015)   No facility-administered encounter medications on file as of 11/11/2015.    Past Medical History   Diagnosis Date  . COPD (chronic obstructive pulmonary disease) ([place])   . History of blood clots     eye   . Dilation of intestine 01/2015  . Asthma   . Severe mitral regurgitation   . Atrial fibrillation, persistent ([place]) 07/2015  . History of rheumatic fever   . Tobacco abuse   . Chronic diastolic congestive heart failure ([place])   . Anginal pain ([place])   . Hypertension   . Dysrhythmia   . Heart murmur   . Stroke ([place])   . Kidney stone   . History of hiatal hernia   . GERD (gastroesophageal reflux disease)   . Headache   . Arthritis   . Fibromyalgia   . S/P minimally invasive mitral valve repair 10/22/2015    Complex valvuloplasty including triangular resection of posterior leaflet, artificial Gore-tex neochord placement x6 and 38 mm Sorin Memo 3D Rechord ring annuloplasty via right minithoracotomy approach  . S/P Minimally invasive maze operation for atrial fibrillation 10/22/2015    Complete bilateral atrial lesion set using cryothermy and bipolar radiofrequency ablation with clipping of LA appendage via right mini thoracotomy approach    Past Surgical History  Procedure Laterality Date  . Ankle surgery    . Tee without cardioversion N/A 08/18/2015    Procedure: TRANSESOPHAGEAL ECHOCARDIOGRAM (TEE);  Surgeon: Wellington Hampshire, MD;  Location: [facility] ORS;  Service: Cardiovascular;  Laterality: N/A;  . Electrophysiologic study N/A 08/18/2015    Procedure: CARDIOVERSION;  Surgeon: Wellington Hampshire, MD;  Location: [facility] ORS;  Service: Cardiovascular;  Laterality: N/A;  . Cardiac catheterization N/A 10/03/2015    Procedure: Right and Left Heart Cath and Coronary Angiography;  Surgeon: [name], MD;  Location: [place] [facility];  Service: Cardiovascular;  Laterality: N/A;  . Colonoscopy    . Sinus exploration    . Mitral valve repair Right 10/22/2015    Procedure: MINIMALLY INVASIVE MITRAL VALVE REPAIR (MVR);  Surgeon: Rexene Alberts, MD;  Location: [place];  Service: Open Heart  Surgery;  Laterality: Right;  . Minimally invasive maze procedure N/A 10/22/2015    Procedure: MINIMALLY INVASIVE MAZE PROCEDURE;  Surgeon: Rexene Alberts, MD;  Location: [place];  Service: Open Heart Surgery;  Laterality: N/A;  . Tee without cardioversion N/A 10/22/2015    Procedure: TRANSESOPHAGEAL ECHOCARDIOGRAM (TEE);  Surgeon: Rexene Alberts, MD;  Location: [place];  Service: Open Heart Surgery;  Laterality: N/A;    Social History  reports that he has been smoking Cigarettes.  He has a 26 pack-year smoking history. He does not have any smokeless tobacco history on file. He reports that he drinks about 0.5 oz of alcohol per week. He reports that he does not use illicit drugs.  Family History Family history is unknown by patient.  Review of Systems  Constitutional: Positive for fatigue.  Eyes: Positive for visual disturbance.  Cardiovascular: Negative.   Gastrointestinal: Negative.   Musculoskeletal: Negative.   Neurological: Positive for weakness.  Psychiatric/Behavioral: Positive for dysphoric mood.  All other systems reviewed and are negative.   BP 100/62 mmHg  Pulse 67  Ht 6\' 1"  (  1.854 m)  Wt 193 lb 12 oz (87.884 kg)  BMI 25.57 kg/m2  Physical Exam  Constitutional: He is oriented to person, place, and time. He appears well-developed and well-nourished.  HENT:  Head: Normocephalic.  Nose: Nose normal.  Mouth/Throat: Oropharynx is clear and moist.  Eyes: Conjunctivae are normal. Pupils are equal, round, and reactive to light.  Neck: Normal range of motion. Neck supple. No JVD present.  Cardiovascular: Normal rate, regular rhythm, S1 normal, S2 normal and intact distal pulses.  Exam reveals no gallop and no friction rub.   Pulmonary/Chest: Effort normal and breath sounds normal. No respiratory distress. He has no wheezes. He has no rales. He exhibits no tenderness.  Abdominal: Soft. Bowel sounds are normal. He exhibits no distension. There is no tenderness.  Musculoskeletal:  Normal range of motion. He exhibits no edema or tenderness.  Lymphadenopathy:    He has no cervical adenopathy.  Neurological: He is alert and oriented to person, place, and time. Coordination normal.  Skin: Skin is warm and dry. No rash noted. No erythema.  Psychiatric: He has a normal mood and affect. His behavior is normal. Judgment and thought content normal.      Assessment and Plan   Nursing note and vitals reviewed.

## 2015-11-11 NOTE — Assessment & Plan Note (Signed)
Maintaining normal sinus rhythm Last episode of atrial fibrillation August 2016, cardioversion at that time Complications on warfarin with bleeding in his eye, loss of vision on the right After long discussion, he prefers to hold the warfarin. He does appreciate the risk of stopping Coumadin but prefers to take aspirin and Plavix

## 2015-11-11 NOTE — Patient Instructions (Addendum)
You are doing well.  We will schedule an echocardiogram for mitral valve repair  Date & time:__________________________________  Increase fluid intake miralex and citracel for contipation  Hold the warfarin Start aspirin tomorrow With plavix  Cardiac rehab next week  Please call us if you have new issues that need to be addressed before your next appt.  Your physician wants you to follow-up in: 1 month.   Echocardiogram An echocardiogram, or echocardiography, uses sound waves (ultrasound) to produce an image of your heart. The echocardiogram is simple, painless, obtained within a short period of time, and offers valuable information to your health care provider. The images from an echocardiogram can provide information such as:  Evidence of coronary artery disease (CAD).  Heart size.  Heart muscle function.  Heart valve function.  Aneurysm detection.  Evidence of a past heart attack.  Fluid buildup around the heart.  Heart muscle thickening.  Assess heart valve function. LET [facility] CARE PROVIDER KNOW ABOUT:  Any allergies you have.  All medicines you are taking, including vitamins, herbs, eye drops, creams, and over-the-counter medicines.  Previous problems you or members of your family have had with the use of anesthetics.  Any blood disorders you have.  Previous surgeries you have had.  Medical conditions you have.  Possibility of pregnancy, if this applies. BEFORE THE PROCEDURE  No special preparation is needed. Eat and drink normally.  PROCEDURE   In order to produce an image of your heart, gel will be applied to your chest and a wand-like tool (transducer) will be moved over your chest. The gel will help transmit the sound waves from the transducer. The sound waves will harmlessly bounce off your heart to allow the heart images to be captured in real-time motion. These images will then be recorded.  You may need an IV to receive a medicine that  improves the quality of the pictures. AFTER THE PROCEDURE You may return to your normal schedule including diet, activities, and medicines, unless your health care provider tells you otherwise.   This information is not intended to replace advice given to you by your health care provider. Make sure you discuss any questions you have with your health care provider.   Document Released: 12/03/2000 Document Revised: 12/27/2014 Document Reviewed: 08/13/2013 Elsevier Interactive Patient Education 2016 [place] Cardiac rehabilitation is a medically supervised program that helps improve the health and well-being of people with heart problems. Cardiac rehabilitation includes exercise training, education, and counseling to help you get stronger and return to an active lifestyle. People who participate in cardiac rehabilitation programs get better faster and reduce future hospital stays. Cardiac rehabilitation programs can help when you have had the following conditions:  Heart attack.  Heart failure.  Peripheral artery disease.  Coronary artery disease.  Angina.  Lung or breathing problems. Cardiac rehabilitation programs are also used when you have the following procedures:  Coronary artery bypass graft surgery.  Heart valve replacement.  Heart stent placement.  Heart transplant.  Aneurysm repair. CARDIAC REHABILITATION MAY HELP YOU:  Reduce problems like chest pain and trouble breathing.  Change risk factors that contribute to heart disease, such as:  Smoking.  High blood pressure.  High cholesterol.  Diabetes.  Being out of shape or not active.  Weighing more than 30% over your ideal weight.  Diet.  Improve your mental outlook so you feel:  Less depressed or "blue."  More hopeful.  Better about yourself.  More confident about taking care  of yourself.  Get support from health experts as well as other people with similar  problems.  Learn how to manage and understand your medicines.  Teach your family about your condition and how to participate in your recovery. WHAT HAPPENS IN CARDIAC REHABILITATION? You will be assessed by a cardiac rehabilitation team. They will check your health history and do a physical exam. You may need blood tests, stress tests, and other evaluations. You may not start a cardiac rehabilitation program if:  You develop angina with exercise or while at rest.  You have severe heart failure that limits your activity.  You have an abnormal heart rhythm at rest.  You develop heart rhythm problems during exercise.  You have high blood pressure that is not controlled. The cardiac rehabilitation team works with you to make a plan based on your health and goals. Everyone is unique, so each program is customized and your program may change as you progress. Members of a typical cardiac rehabilitation team may include such health professionals as:  Doctors.  Nurses.  Dietitians.  Psychologists.  Exercise specialists.  Physical and occupational therapists. A typical cardiac rehabilitation program is divided into phases. You advance from one phase to the next. Most cardiac rehabilitation sessions last for 60 minutes, 3 times a week.  Phase One starts while you are still in the hospital. You may start by walking in your room and then in the hall. You may start some simple exercises with a therapist. Health care team members will give you information and ask you many questions. You may not be able to remember details, so have a family member or an advocate with you to help keep track of information.  Phase Two begins when you go home or to another facility. This phase may last 8 to 12 weeks. You will travel to a cardiac rehabilitation center or a place where it is offered. Typically, you gradually increase your activity while being closely watched by a nurse or therapist. Exercises may be a  combination of strength or resistance training and "cardio" or aerobic movement on a treadmill or other machines. Your condition will determine how often and how long these sessions will last.  In phase two, you may learn how to cook healthy meals, control your blood sugar, and manage your medicines. You may need help with scheduling or planning how and when to take your medicines. Use a timer, divided pill box, or follow a form to make taking your medicines easier. Use the method that works best for you. Some medicines should not be taken with certain foods. If you take more than one blood pressure medicine, you may need to stagger the times you take them. Taking all your blood pressure medicine at the same time may lower your blood pressure too much. If you have questions about your medicines, ask your health care provider questions until you understand.  Phase Three continues for the rest of your life. There will be less supervision. You may still participate in cardiac rehabilitation activities or become part of a group in your community. You may benefit from talking to other people about your experience if they are facing similar challenges. How soon you drive, have sex, or return to work will depend on your condition. These decisions should be made by you and your health care provider. If you need help, ask for it. Find out where you can get the help you need. Ask questions until you get answers and understand. SEEK IMMEDIATE  MEDICAL CARE IF:  Get medical help at once if you experience any of the following symptoms:  Severe chest discomfort, especially if the pain is crushing or pressure-like and spreads to the arms, back, neck, or jaw. Do not wait to see if the pain will go away.  Weakness or numbness in your face, arms, or legs, especially on one side of the body; slurred speech; confusion; sudden severe headache or loss of vision (all symptoms of stroke).  You have shortness of breath.  You are  sweating and feel sick to your stomach (nausea).  You feel dizzy or faint.  You experience profound tiredness (fatigue). Call your local emergency service (911 in the [place] Do not drive yourself to the hospital.   This information is not intended to replace advice given to you by your health care provider. Make sure you discuss any questions you have with your health care provider.   Document Released: 09/14/2008 Document Revised: 12/27/2014 Document Reviewed: 03/12/2011 Elsevier Interactive Patient Education [employer].

## 2015-11-19 ENCOUNTER — Other Ambulatory Visit: Payer: Self-pay | Admitting: *Deleted

## 2015-11-19 DIAGNOSIS — G8918 Other acute postprocedural pain: Secondary | ICD-10-CM

## 2015-11-19 MED ORDER — TRAMADOL HCL 50 MG PO TABS: 50.0000 mg | ORAL_TABLET | Freq: Four times a day (QID) | ORAL | Status: DC | PRN

## 2015-11-19 NOTE — Progress Notes (Signed)
Per his request, a new Tramadol script was faxed to his pharmacy this pm.

## 2015-11-26 ENCOUNTER — Ambulatory Visit (INDEPENDENT_AMBULATORY_CARE_PROVIDER_SITE_OTHER): Payer: Medicare HMO

## 2015-11-26 ENCOUNTER — Other Ambulatory Visit: Payer: Self-pay

## 2015-11-26 DIAGNOSIS — I341 Nonrheumatic mitral (valve) prolapse: Secondary | ICD-10-CM | POA: Diagnosis not present

## 2015-11-26 DIAGNOSIS — I4891 Unspecified atrial fibrillation: Secondary | ICD-10-CM | POA: Diagnosis not present

## 2015-11-27 ENCOUNTER — Telehealth: Payer: Self-pay | Admitting: Cardiovascular Disease

## 2015-11-27 NOTE — Telephone Encounter (Signed)
°*  STAT* If patient is at the pharmacy, call can be transferred to refill team.   1. Which medications need to be refilled? (please list name of each medication and dose if known) Xanax 0.5 mg 1 tablet po 3 times daily prn anxiety and sleep   2. Which pharmacy/location (including street and city if local pharmacy) is medication to be sent to?  [facility]    3. Do they need a 30 day or 90 day supply? 30 or 90  PATIENT DOES NOT HAVE PCP AND WAS HOPING DOCTOR GOLLAN WOULD CONSIDER FILLING

## 2015-11-27 NOTE — Telephone Encounter (Signed)
We can't fill Xanax.  Looks like he had this written on 11/10/15, he should contact the prescribing MD for refills.

## 2015-11-28 ENCOUNTER — Other Ambulatory Visit: Payer: Self-pay | Admitting: Physician Assistant

## 2015-11-28 NOTE — Telephone Encounter (Signed)
Called patient to let him know.   He will call Dr. Roxy Manns office bc Suzzanne Cloud Wrote Rx.  Gave contact info.

## 2015-12-01 ENCOUNTER — Encounter: Payer: Self-pay | Admitting: Thoracic Surgery (Cardiothoracic Vascular Surgery)

## 2015-12-01 ENCOUNTER — Ambulatory Visit (INDEPENDENT_AMBULATORY_CARE_PROVIDER_SITE_OTHER): Payer: Self-pay | Admitting: Thoracic Surgery (Cardiothoracic Vascular Surgery)

## 2015-12-01 VITALS — BP 142/83 | HR 76 | Resp 16 | Ht 72.0 in | Wt 193.0 lb

## 2015-12-01 DIAGNOSIS — I340 Nonrheumatic mitral (valve) insufficiency: Secondary | ICD-10-CM

## 2015-12-01 DIAGNOSIS — Z9889 Other specified postprocedural states: Secondary | ICD-10-CM

## 2015-12-01 DIAGNOSIS — I480 Paroxysmal atrial fibrillation: Secondary | ICD-10-CM

## 2015-12-01 DIAGNOSIS — Z8679 Personal history of other diseases of the circulatory system: Secondary | ICD-10-CM

## 2015-12-01 MED ORDER — ALPRAZOLAM 0.5 MG PO TABS: 0.5000 mg | ORAL_TABLET | Freq: Three times a day (TID) | ORAL | Status: DC | PRN

## 2015-12-01 NOTE — Patient Instructions (Signed)
Stop taking amiodarone  You may continue to gradually increase your physical activity as tolerated.  Refrain from any heavy lifting or strenuous use of your arms and shoulders until at least 8 weeks from the time of your surgery, and avoid activities that cause increased pain in your chest on the side of your surgical incision.  Otherwise you may continue to increase activities without any particular limitations.  Increase the intensity and duration of physical activity gradually.  The patient is encouraged to enroll and participate in the outpatient cardiac rehab program beginning as soon as practical.  You may return to driving an automobile once you are cleared by your opthalmologist as long as you are no longer requiring oral narcotic pain relievers during the daytime.  It would be wise to start driving only short distances during the daylight and gradually increase from there as you feel comfortable.

## 2015-12-01 NOTE — Progress Notes (Signed)
[address]       [place] [address]             [phone]     CARDIOTHORACIC SURGERY OFFICE NOTE  Referring Provider is [name], MD PCP is No PCP Per Patient   HPI:  Patient returns to the office today for follow-up status post minimally invasive mitral valve repair and maze procedure on 10/22/2015 for severe symptomatic primary mitral regurgitation with persistent atrial fibrillation.  His postoperative recovery in the hospital was notable for sinus node dysfunction and bradycardia, but his underlying heart rate recovered and he was discharged from the hospital on the seventh postoperative day. He was discharged from the hospital on warfarin for anticoagulation.  He was evaluated in the emergency department at [facility] on 11/05/2015 with blurry vision in the right eye.  Brain CT was negative but his INR was supratherapeutic at 6.0. He was referred to the [facility] the following day and told that he had "bleeding behind his eye" with elevated intraocular pressure. His warfarin dose was adjusted, but he later stopped taking it all together because of concerns regarding the possibility of further bleeding. He was seen in follow-up by Dr. [name] 11/11/2015 and started on dual antiplatelet therapy using low-dose aspirin and Plavix.  At that time he was noted to be in an accelerated junctional rhythm. He has not had any known recurrence of atrial fibrillation since he underwent surgery.  He underwent routine follow-up echocardiogram 11/26/2015. This revealed intact mitral valve repair with no significant mitral regurgitation and normal left ventricular systolic function. The patient returns to our office for routine follow-up today.  The patient states that he feels like he still doesn't have much energy. He has occasional fleeting pains across his chest that are transient and seem to be exacerbated by movement or coughing. He has not been using much pain  medicine. He has been having problems sleeping and he thinks he feels as though he may be anxious or depressed. He ran out of Xanax, and he states that since then he hasn't been able to sleep. Appetite is marginal. Food just doesn't seem to taste right.  He denies any shortness of breath. He has not been walking much. He has not had palpitations.  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  For the last week or so he has had intermittent episodes of blurry vision in his right eye but he has not had any complete loss of vision such as that which reportedly occurred on 11/05/2015.  Current Outpatient Prescriptions  Medication Sig Dispense Refill  . ALPRAZolam (XANAX) 0.5 MG tablet Take 1 tablet (0.5 mg total) by mouth 3 (three) times daily as needed for anxiety or sleep. 30 tablet 0  . amiodarone (PACERONE) 200 MG tablet Take 1 tablet (200 mg total) by mouth daily. 30 tablet 0  . aspirin EC 81 MG tablet Take 1 tablet (81 mg total) by mouth daily. 90 tablet 3  . clopidogrel (PLAVIX) 75 MG tablet Take 1 tablet (75 mg total) by mouth daily. 90 tablet 3  . traMADol (ULTRAM) 50 MG tablet Take 1-2 tablets (50-100 mg total) by mouth every 6 (six) hours as needed for moderate pain. 40 tablet 0  No current facility-administered medications for this visit.      Physical Exam:   BP 142/83 mmHg  Pulse 76  Resp 16  Ht 6' (1.829 m)  Wt 193 lb (87.544 kg)  BMI 26.17 kg/m2  SpO2 97%  General:  Well-appearing  Chest:   Clear to auscultation  CV:   Regular rate and rhythm without murmur  Incisions:  Healing nicely  Abdomen:  Soft and nontender  Extremities:  Warm and well-perfused  Diagnostic Tests:  Transthoracic Echocardiography  Patient:  Michael Delacruz, [place] MR #:     [id number] Study Date: 11/26/2015 Gender:   M Age:    89 Height:   185.4 cm Weight:   87.5 kg BSA:    2.13 m^2 Pt. Status: Room:  SONOGRAPHER [facility] ATTENDING  [place], Tim Dannielle Karvonen, Tim REFERRING  Gollan, Tim PERFORMING  [facility],   cc:  ------------------------------------------------------------------- LV EF: 55% -  60%  ------------------------------------------------------------------- History:  PMH:  Fatigue and weakness. Atrial fibrillation. Mitral valve disease. Stroke. Chronic obstructive pulmonary disease. Risk factors: Current tobacco use. Hypertension. Dyslipidemia.  ------------------------------------------------------------------- Study Conclusions  - Left ventricle: The cavity size was normal. Systolic function was normal. The estimated ejection fraction was in the range of 55% to 60%. Wall motion was normal; there were no regional wall motion abnormalities. Left ventricular diastolic function parameters were normal. - Aorta: Aortic root dimension: 44 mm (ED). - Aortic root: The aortic root was moderately dilated. - Mitral valve: There was mild regurgitation. - Left atrium: The atrium was mildly dilated. - Right ventricle: Systolic function was normal. - Pulmonary arteries: Systolic pressure was within the normal range.  ------------------------------------------------------------------- Labs, prior tests, procedures, and surgery: Echocardiography (August 2016).   EF was 65%.  Catheterization (November 2016).  Mitral valvuloplasty was performed. EF was 45%. s/p MAZE procedure. Transthoracic echocardiography. M-mode, complete 2D, spectral Doppler, and color Doppler. Birthdate: Patient birthdate: [birth year]. Age: Patient is 74 yr old. Sex: Gender: male. BMI: 25.5 kg/m^2. Blood pressure:   100/62 Patient status: Outpatient. Study date: Study date: 11/26/2015. Study  time: 10:32 AM.  -------------------------------------------------------------------  ------------------------------------------------------------------- Left ventricle: The cavity size was normal. Systolic function was normal. The estimated ejection fraction was in the range of 55% to 60%. Wall motion was normal; there were no regional wall motion abnormalities. The transmitral flow pattern was normal. The deceleration time of the early transmitral flow velocity was normal. The pulmonary vein flow pattern was normal. The tissue Doppler parameters were normal. Left ventricular diastolic function parameters were normal.  ------------------------------------------------------------------- Aortic valve:  Trileaflet; normal thickness, mildly calcified leaflets. Mobility was not restricted. Doppler: Transvalvular velocity was within the normal range. There was no stenosis. There was no regurgitation.  ------------------------------------------------------------------- Aorta: Aortic root: The aortic root was moderately dilated.  ------------------------------------------------------------------- Mitral valve: S/p mitral valvuloplasty, normal leaflets. Mobility was not restricted. Doppler: Transvalvular velocity was within the normal range. There was no evidence for stenosis. There was mild regurgitation.  Peak gradient (D): 9 mm Hg.  ------------------------------------------------------------------- Left atrium: The atrium was mildly dilated.  ------------------------------------------------------------------- Right ventricle: The cavity size was normal. Wall thickness was normal. Systolic function was normal.  ------------------------------------------------------------------- Pulmonic valve:  Structurally normal valve.  Cusp separation was normal. Doppler: Transvalvular velocity was within the normal range. There was no evidence for stenosis. There was  trivial regurgitation.  ------------------------------------------------------------------- Tricuspid valve:  Structurally normal valve.  Doppler: Transvalvular velocity was within the normal range. There was mild regurgitation.  ------------------------------------------------------------------- Pulmonary artery:  The main  pulmonary artery was normal-sized. Systolic pressure was within the normal range.  ------------------------------------------------------------------- Right atrium: The atrium was normal in size.  ------------------------------------------------------------------- Pericardium: There was no pericardial effusion.  ------------------------------------------------------------------- Systemic veins: Inferior vena cava: The vessel was normal in size.  ------------------------------------------------------------------- Measurements  Left ventricle               Value    Reference LV ID, ED, PLAX chordal       (L)   41.8 mm   43 - 52 LV ID, ES, PLAX chordal       (L)   20.8 mm   23 - 38 LV fx shortening, PLAX chordal       50  %   >=29 LV PW thickness, ED             11.8 mm   --------- IVS/LV PW ratio, ED             1.2     <=1.3 Stroke volume, 2D              120  ml   --------- Stroke volume/bsa, 2D            56  ml/m^2 --------- LV e&', lateral               7.7  cm/s  --------- LV E/e&', lateral              18.96    --------- LV e&', medial                7.31 cm/s  --------- LV E/e&', medial               19.97    --------- LV e&', average               7.51 cm/s  --------- LV E/e&', average              19.45    ---------  Ventricular septum             Value    Reference IVS thickness, ED               14.2 mm   ---------  LVOT                    Value    Reference LVOT ID, S                 27  mm   --------- LVOT area                  5.73 cm^2  --------- LVOT ID                   27  mm   --------- LVOT peak velocity, S            104  cm/s  --------- LVOT mean velocity, S            68.1 cm/s  --------- LVOT VTI, S                 21  cm   --------- LVOT peak gradient, S            4   mm Hg --------- Stroke volume (SV), LVOT DP         120.2 ml   --------- Stroke index (SV/bsa), LVOT DP       56.4 ml/m^2 ---------  Aorta  Value    Reference Aortic root ID, ED             44  mm   ---------  Left atrium                 Value    Reference LA ID, A-P, ES               42  mm   --------- LA ID/bsa, A-P               1.97 cm/m^2 <=2.2 LA volume, S                74  ml   --------- LA volume/bsa, S              34.7 ml/m^2 --------- LA volume, ES, 1-p A4C           64  ml   --------- LA volume/bsa, ES, 1-p A4C         30  ml/m^2 --------- LA volume, ES, 1-p A2C           85  ml   --------- LA volume/bsa, ES, 1-p A2C         39.9 ml/m^2 ---------  Mitral valve                Value    Reference Mitral E-wave peak velocity         146  cm/s  --------- Mitral A-wave peak velocity         52.4 cm/s  --------- Mitral deceleration time      (H)   356  ms   150 - 230 Mitral peak gradient, D           9   mm Hg --------- Mitral E/A ratio, peak           2      --------- Mitral maximal regurg velocity,        353  cm/s  --------- PISA  Pulmonary arteries             Value    Reference PA pressure, S, DP             14  mm Hg <=30  Tricuspid valve               Value    Reference Tricuspid regurg peak velocity       166  cm/s  --------- Tricuspid peak RV-RA gradient        11  mm Hg ---------  Systemic veins               Value    Reference Estimated CVP                3   mm Hg ---------  Right ventricle               Value    Reference RV pressure, S, DP             14  mm Hg <=30 RV s&', lateral, S              9.5  cm/s  ---------  Legend: (L) and (H) mark values outside specified reference range.  ------------------------------------------------------------------- Prepared and Electronically Authenticated by  Esmond Plants, MD, [facility] 2016-12-07T18:37:39    Impression:  Patient appears clinically stable approximately 4 weeks status post minimally invasive mitral valve repair and maze procedure. He is now on  low-dose aspirin and Plavix dual antiplatelet therapy.  Warfarin has been discontinued subsequent to an episode of retinal hemorrhage that developed last month when his INR was supratherapeutic.  The patient has not had any clinical signs or symptoms suggestive of recurrence of atrial fibrillation postoperatively. I have personally reviewed his recent follow-up transthoracic echocardiogram. The patient's mitral valve repair looks good with no residual mitral regurgitation and normal left ventricular systolic function.  There was no significant gradient across the left ventricular outflow tract and no pericardial effusion.     Plan:  I have instructed the patient to stop taking amiodarone at this time because of concerns that it could be contributing to his poor appetite and generalized malaise.  We have  discussed the indications for anticoagulation using warfarin early following mitral valve repair, particular given the history of atrial fibrillation. However, given the circumstances of the patient's recent retinal bleeding event the use of dual antiplatelet therapy as a substitute seems reasonable. Nevertheless, there remains a small risk of thromboembolism and stroke which must be weighed against the potential risks of repeat bleeding.  We have not recommended any other changes to his current medications at this time.  I have given him 1 refill prescription for Xanax for a total of 30 tablets at this time. He has been instructed that this prescription will not be refilled again in the future.  I have encouraged the patient to begin increasing his physical activity as tolerated with his only significant limitation remaining that he refrain from heavy lifting or strenuous use of his arms or shoulders for least another 4-6 weeks.  I have encouraged him to enroll and participate in the outpatient cardiac rehabilitation program.  Whether or not the patient should be cleared to return to driving should depend upon his vision. From a surgical standpoint there are no contraindications. The patient will return for follow-up and rhythm check in 2 months. All of his questions have been addressed.   Valentina Gu. Roxy Manns, MD 12/01/2015 4:37 PM

## 2015-12-05 ENCOUNTER — Observation Stay
Admission: EM | Admit: 2015-12-05 | Discharge: 2015-12-06 | Disposition: A | Discharge status: Home / Self Care | Payer: Medicare HMO | Attending: Internal Medicine | Admitting: Internal Medicine

## 2015-12-05 ENCOUNTER — Encounter: Payer: Self-pay | Admitting: *Deleted

## 2015-12-05 ENCOUNTER — Emergency Department: Payer: Medicare HMO

## 2015-12-05 DIAGNOSIS — F17210 Nicotine dependence, cigarettes, uncomplicated: Secondary | ICD-10-CM | POA: Insufficient documentation

## 2015-12-05 DIAGNOSIS — I6523 Occlusion and stenosis of bilateral carotid arteries: Secondary | ICD-10-CM | POA: Diagnosis not present

## 2015-12-05 DIAGNOSIS — M797 Fibromyalgia: Secondary | ICD-10-CM | POA: Diagnosis not present

## 2015-12-05 DIAGNOSIS — R531 Weakness: Secondary | ICD-10-CM | POA: Insufficient documentation

## 2015-12-05 DIAGNOSIS — G459 Transient cerebral ischemic attack, unspecified: Secondary | ICD-10-CM | POA: Diagnosis present

## 2015-12-05 DIAGNOSIS — Z8673 Personal history of transient ischemic attack (TIA), and cerebral infarction without residual deficits: Secondary | ICD-10-CM | POA: Insufficient documentation

## 2015-12-05 DIAGNOSIS — R29898 Other symptoms and signs involving the musculoskeletal system: Secondary | ICD-10-CM

## 2015-12-05 DIAGNOSIS — Z952 Presence of prosthetic heart valve: Secondary | ICD-10-CM | POA: Insufficient documentation

## 2015-12-05 DIAGNOSIS — R200 Anesthesia of skin: Secondary | ICD-10-CM | POA: Insufficient documentation

## 2015-12-05 DIAGNOSIS — J449 Chronic obstructive pulmonary disease, unspecified: Secondary | ICD-10-CM | POA: Diagnosis not present

## 2015-12-05 DIAGNOSIS — I639 Cerebral infarction, unspecified: Principal | ICD-10-CM | POA: Insufficient documentation

## 2015-12-05 DIAGNOSIS — J45909 Unspecified asthma, uncomplicated: Secondary | ICD-10-CM | POA: Diagnosis not present

## 2015-12-05 DIAGNOSIS — I110 Hypertensive heart disease with heart failure: Secondary | ICD-10-CM | POA: Diagnosis not present

## 2015-12-05 DIAGNOSIS — F411 Generalized anxiety disorder: Secondary | ICD-10-CM | POA: Diagnosis not present

## 2015-12-05 DIAGNOSIS — Z7982 Long term (current) use of aspirin: Secondary | ICD-10-CM | POA: Diagnosis not present

## 2015-12-05 DIAGNOSIS — I482 Chronic atrial fibrillation: Secondary | ICD-10-CM | POA: Diagnosis not present

## 2015-12-05 DIAGNOSIS — Z7902 Long term (current) use of antithrombotics/antiplatelets: Secondary | ICD-10-CM | POA: Diagnosis not present

## 2015-12-05 DIAGNOSIS — I5032 Chronic diastolic (congestive) heart failure: Secondary | ICD-10-CM | POA: Diagnosis not present

## 2015-12-05 DIAGNOSIS — Z9889 Other specified postprocedural states: Secondary | ICD-10-CM

## 2015-12-05 DIAGNOSIS — K219 Gastro-esophageal reflux disease without esophagitis: Secondary | ICD-10-CM | POA: Insufficient documentation

## 2015-12-05 DIAGNOSIS — R51 Headache: Secondary | ICD-10-CM | POA: Insufficient documentation

## 2015-12-05 DIAGNOSIS — Z87442 Personal history of urinary calculi: Secondary | ICD-10-CM | POA: Diagnosis not present

## 2015-12-05 NOTE — ED Provider Notes (Signed)
[facility] Emergency Department Provider Note  ____________________________________________  Time seen: Approximately 11:02 PM  I have reviewed the triage vital signs and the nursing notes.   HISTORY  Chief Complaint Numbness    HPI Michael Delacruz is a 74 y.o. male with a history of prior retinal artery occlusion in the right eye about 4 years ago, recent mitral valve replacement who was previously on warfarin but then had bleeding "behind" his right eye, and is now on dual antiplatelet therapy.  He presents to the emergency department several hours after acute onset of weakness in his right hand.  To clarify from the triage note, the patient adamantly denies any numbness or change in sensation.  He says that he had been using his hands all day with an activity and then acutely at approximately 18:30 he suddenly was unable to move the right hand and wrist, but the rest of the right upper extremity had normal motor function.  He had normal sensation.He reports that this episode lasted at least 15 minutes before he started being able to move his fingers again slowly.  He now is almost back to baseline but feels that his grip in his dominant right hand is decreased compared to normal.  He is concerned given his history of prior CVA and the fact that he is guarding on dual antiplatelet therapy.  During the time that his loss of motor function was resident he describes the symptoms as severe, but now they have improved to mild over the last couple of hours.  He denies any recent illnesses, acute chest pain (he always has chronic chest pain after his cardiothoracic surgery), shortness of breath, abdominal pain, nausea/vomiting.  He has approximately 90% vision loss at baseline although he reported to me it is in his right eye, not the left as documented in the triage note; this is unchanged tonight compared to prior.  He does have a headache although he said he frequently has  headaches and it is currently mild and feels similar to prior.  Has had no numbness or weakness in his lower extremities.    Past Medical History  Diagnosis Date  . COPD (chronic obstructive pulmonary disease) ([place])   . History of blood clots     eye   . Dilation of intestine 01/2015  . Asthma   . Severe mitral regurgitation   . Atrial fibrillation, persistent ([place]) 07/2015  . History of rheumatic fever   . Tobacco abuse   . Chronic diastolic congestive heart failure ([place])   . Anginal pain ([place])   . Hypertension   . Dysrhythmia   . Heart murmur   . Stroke ([place])   . Kidney stone   . History of hiatal hernia   . GERD (gastroesophageal reflux disease)   . Headache   . Arthritis   . Fibromyalgia   . S/P minimally invasive mitral valve repair 10/22/2015    Complex valvuloplasty including triangular resection of posterior leaflet, artificial Gore-tex neochord placement x6 and 38 mm Sorin Memo 3D Rechord ring annuloplasty via right minithoracotomy approach  . S/P Minimally invasive maze operation for atrial fibrillation 10/22/2015    Complete bilateral atrial lesion set using cryothermy and bipolar radiofrequency ablation with clipping of LA appendage via right mini thoracotomy approach    Patient Active Problem List   Diagnosis Date Noted  . TIA (transient ischemic attack) 12/06/2015  . S/P MVR (mitral valve repair) 11/11/2015  . Angina pectoris ([place]) 10/03/2015  . Chronic  diastolic congestive heart failure ([place])   . Atrial fibrillation ([place]) 08/15/2015  . Atrial fibrillation with RVR ([place])   . Centrilobular emphysema ([place])   . Shortness of breath   . Hypotension 11/02/2012  . Stress at home 11/02/2012  . Leg pain 11/02/2012  . Smoking 04/27/2012  . Hyperlipidemia 04/27/2012  . CAD (coronary artery disease) 04/27/2012  . Heart palpitations 09/03/2011  . Dizziness 05/19/2011  . SOB (shortness of breath) 05/05/2011  . MVP (mitral valve prolapse) 05/05/2011  . Mitral valve  regurgitation 05/05/2011  . COPD (chronic obstructive pulmonary disease) ([place]) 05/05/2011  . Pulmonary HTN ([place]) 05/05/2011  . CHEST PAIN UNSPECIFIED 01/04/2011    Past Surgical History  Procedure Laterality Date  . Ankle surgery    . Tee without cardioversion N/A 08/18/2015    Procedure: TRANSESOPHAGEAL ECHOCARDIOGRAM (TEE);  Surgeon: Wellington Hampshire, MD;  Location: [facility] ORS;  Service: Cardiovascular;  Laterality: N/A;  . Electrophysiologic study N/A 08/18/2015    Procedure: CARDIOVERSION;  Surgeon: Wellington Hampshire, MD;  Location: [facility] ORS;  Service: Cardiovascular;  Laterality: N/A;  . Cardiac catheterization N/A 10/03/2015    Procedure: Right and Left Heart Cath and Coronary Angiography;  Surgeon: [name], MD;  Location: [facility];  Service: Cardiovascular;  Laterality: N/A;  . Colonoscopy    . Sinus exploration    . Mitral valve repair Right 10/22/2015    Procedure: MINIMALLY INVASIVE MITRAL VALVE REPAIR (MVR);  Surgeon: Rexene Alberts, MD;  Location: [place];  Service: Open Heart Surgery;  Laterality: Right;  . Minimally invasive maze procedure N/A 10/22/2015    Procedure: MINIMALLY INVASIVE MAZE PROCEDURE;  Surgeon: Rexene Alberts, MD;  Location: [place];  Service: Open Heart Surgery;  Laterality: N/A;  . Tee without cardioversion N/A 10/22/2015    Procedure: TRANSESOPHAGEAL ECHOCARDIOGRAM (TEE);  Surgeon: Rexene Alberts, MD;  Location: [place];  Service: Open Heart Surgery;  Laterality: N/A;    No current outpatient prescriptions on file.  Allergies Codeine; Macrodantin; and Morphine and related  Family History  Problem Relation Age of Onset  . Hypertension Other     Social History Social History  Substance Use Topics  . Smoking status: Current Every Day Smoker -- 0.50 packs/day for 52 years    Types: Cigarettes  . Smokeless tobacco: None  . Alcohol Use: 0.5 oz/week    1 Standard drinks or equivalent per week     Comment: rare    Review of  Systems Constitutional: No fever/chills Eyes: No visual changes.  Baseline visual deficit in the right eye from 4-5 years ago. ENT: No sore throat. Cardiovascular: Chronic chest pain, nothing acute Respiratory: Denies shortness of breath. Gastrointestinal: No abdominal pain.  No nausea, no vomiting.  No diarrhea.  No constipation. Genitourinary: Negative for dysuria. Musculoskeletal: Negative for back pain. Skin: Negative for rash. Neurological: Approximately 15 minute episode of inability to move his right hand including the wrist, but the rest of the right upper extremity was spared.  Now nearly back to baseline but still with slightly decreased grip strength in the right hand  10-point ROS otherwise negative.  ____________________________________________   PHYSICAL EXAM:  VITAL SIGNS: ED Triage Vitals  Enc Vitals Group     BP 12/05/15 2035 148/76 mmHg     Pulse Rate 12/05/15 2035 63     Resp 12/05/15 2035 17     Temp 12/05/15 2035 98.3 F (36.8 C)     Temp Source 12/05/15 2035 Oral  SpO2 12/05/15 2035 98 %     Weight 12/05/15 2035 195 lb (88.451 kg)     Height 12/05/15 2035 6\' 1"  (1.854 m)     Head Cir --      Peak Flow --      Pain Score 12/05/15 2102 2     Pain Loc --      Pain Edu? --      Excl. in [place]? --     Constitutional: Alert and oriented. Well appearing and in no acute distress. Eyes: Conjunctivae are normal. PERRL. EOMI. Head: Atraumatic. Nose: No congestion/rhinnorhea. Mouth/Throat: Mucous membranes are moist.  Oropharynx non-erythematous. Neck: No stridor.   Cardiovascular: Normal rate, regular rhythm. Grossly normal heart sounds.  Good peripheral circulation. Respiratory: Normal respiratory effort.  No retractions. Lungs CTAB. Gastrointestinal: Soft and nontender. No distention. No abdominal bruits. No CVA tenderness. Musculoskeletal: No lower extremity tenderness nor edema.  No joint effusions. Neurologic:  The patient has  a very slightly  decreased grip strength in the right hand although with encouragement he is able to give me a little bit more than he did initially.  His flexion and extension of the upper extremities are normal.  His sensation is intact.  His sensation and motor function are intact and normal in his lower extremities.  He has no cranial nerve deficits that are apparent. Skin:  Skin is warm, dry and intact. No rash noted. Psychiatric: Mood and affect are normal. Speech and behavior are normal.  ____________________________________________   LABS (all labs ordered are listed, but only abnormal results are displayed)  Labs Reviewed  CBC WITH DIFFERENTIAL/PLATELET - Abnormal; Notable for the following:    RBC 4.04 (*)    Hemoglobin 11.5 (*)    HCT 35.8 (*)    RDW 15.3 (*)    All other components within normal limits  COMPREHENSIVE METABOLIC PANEL - Abnormal; Notable for the following:    Glucose, Bld 109 (*)    Albumin 3.3 (*)    AST 12 (*)    ALT 10 (*)    All other components within normal limits  APTT - Abnormal; Notable for the following:    aPTT 41 (*)    All other components within normal limits  URINALYSIS COMPLETEWITH MICROSCOPIC ([facility] ONLY) - Abnormal; Notable for the following:    Color, Urine YELLOW (*)    APPearance CLOUDY (*)    Leukocytes, UA TRACE (*)    Bacteria, UA RARE (*)    All other components within normal limits  PROTIME-INR  TSH  HEMOGLOBIN A1C   ____________________________________________  EKG  ED ECG REPORT I, Rebbecca Osuna, the attending physician, personally viewed and interpreted this ECG.   Date: 12/05/2015  EKG Time: 20:40   Rate: 66  Rhythm: accelerated junctional rhythm  Axis: normal  Intervals:no visible P-waves in junctional rhythm, otherwise unremarkable  ST&T Change: inverted T waves in II, aVF, V4-V6. Minimal ST depression in V4-V6.  However, these changes were present in a prior ECG from last month.  No acute changes or evidence of acute  ischemia.  ____________________________________________  RADIOLOGY   Ct Head Wo Contrast  12/05/2015  CLINICAL DATA:  Fall. Sudden onset of right arm and hand numbness. Headache. EXAM: CT HEAD WITHOUT CONTRAST TECHNIQUE: Contiguous axial images were obtained from the base of the skull through the vertex without intravenous contrast. COMPARISON:  Head CT 11/05/2015 FINDINGS: Mild atrophy and chronic small vessel ischemic change, stable from prior exam. No intracranial hemorrhage, mass effect,  or midline shift. No hydrocephalus. The basilar cisterns are patent. No evidence of territorial infarct. No intracranial fluid collection. Atherosclerosis of skullbase vasculature noted. Calvarium is intact. Included paranasal sinuses and mastoid air cells are well aerated. IMPRESSION: Mild atrophy and chronic small vessel ischemic change without acute intracranial abnormality. Electronically Signed   By: Jeb Levering M.D.   On: 12/05/2015 23:56    ____________________________________________   PROCEDURES  Procedure(s) performed: None  Critical Care performed: No ____________________________________________   INITIAL IMPRESSION / ASSESSMENT AND PLAN / ED COURSE  Pertinent labs & imaging results that were available during my care of the patient were reviewed by me and considered in my medical decision making (see chart for details).  The patient's description of right hand and wrist weakness that does not include any of the rest of the right upper extremity is unusual and not completely consistent with a CVA/TIA.  The patient is at high risk compared to the general population given his prior CVA, mitral valve replacement, etc., but he is already on dual antiplatelet therapy.  He is not a candidate for TPA because of the onset of his symptoms exceeding the window but also because of the minor deficit and the rapid improvement.  I will evaluate with a CT scan of his head and basic labs including  coagulation studies.  This may represent a peripheral neuropathy if for example he was compressing the nerve while he was working on his hobby.  I will reassess after results are back.   (Note that documentation was delayed due to multiple ED patients requiring immediate care.)    The patient's CT scan was unremarkable.  On reassessment the patient is neurologically intact with no appreciable decrease of right grip strength.  I called and spoke with the on-call neurologist with [facility] (Dr. Irish Elders was unavailable by pager tonight).  I explained the situation and the patient's history of present illness as well as his past medical history.  Neurologist's pointed out that a peripheral nerve impingement would not have caused the constellation of symptoms seen by the patient earlier tonight, and given the mitral valve surgery and the fact he is on dual antiplatelet therapy but not on anticoagulation but symmetrical high risk for an embolic event.  He recommended that we admit the patient for an echocardiogram and MRIs, social for further stroke/TIA workup.  The patient did have a transthoracic echo performed last week but it is likely appropriate that he have a transesophageal echo.  I discussed this with the patient as wife and then discussed it with the hospitalist.  We will proceed with admission for TIA.   ____________________________________________  FINAL CLINICAL IMPRESSION(S) / ED DIAGNOSES  Final diagnoses:  Transient cerebral ischemia, unspecified transient cerebral ischemia type      NEW MEDICATIONS STARTED DURING THIS VISIT:  Current Discharge Medication List       Hinda Kehr, MD 12/06/15 [phone]

## 2015-12-05 NOTE — ED Notes (Signed)
MD at bedside. 

## 2015-12-05 NOTE — ED Notes (Addendum)
Pt reports sudden onset of right arm and hand numbness at 1830 this evening. Pt was unable to move hand or arm for a reported 10 -15 minutes. Right hand grip is weaker than left at this time but sensation is intact and equal bilaterally. Pt has hx of strokes in which only deficit is reported to be 90% left sided vision loss. Pt reports having a headache that began shortly after 1830 this evening. No other neuro deficits noted at this time. Pt is A&O x 4.

## 2015-12-06 ENCOUNTER — Encounter: Payer: Self-pay | Admitting: Internal Medicine

## 2015-12-06 ENCOUNTER — Observation Stay: Payer: Medicare HMO

## 2015-12-06 DIAGNOSIS — I6349 Cerebral infarction due to embolism of other cerebral artery: Secondary | ICD-10-CM

## 2015-12-06 DIAGNOSIS — G459 Transient cerebral ischemic attack, unspecified: Secondary | ICD-10-CM | POA: Diagnosis present

## 2015-12-06 LAB — LIPID PANEL
Cholesterol: 169 mg/dL (ref 0–200)
HDL: 36 mg/dL — ABNORMAL LOW (ref >40)
LDL Cholesterol: 116 mg/dL — ABNORMAL HIGH (ref 0–99)
Total CHOL/HDL Ratio: 4.7 RATIO
Triglycerides: 85 mg/dL (ref <150)
VLDL: 17 mg/dL (ref 0–40)

## 2015-12-06 LAB — COMPREHENSIVE METABOLIC PANEL
ALT: 10 U/L — ABNORMAL LOW (ref 17–63)
AST: 12 U/L — ABNORMAL LOW (ref 15–41)
Albumin: 3.3 g/dL — ABNORMAL LOW (ref 3.5–5.0)
Alkaline Phosphatase: 75 U/L (ref 38–126)
Anion gap: 5 (ref 5–15)
BUN: 9 mg/dL (ref 6–20)
CO2: 29 mmol/L (ref 22–32)
Calcium: 9.3 mg/dL (ref 8.9–10.3)
Chloride: 107 mmol/L (ref 101–111)
Creatinine, Ser: 0.99 mg/dL (ref 0.61–1.24)
GFR calc Af Amer: >60 mL/min (ref >60)
GFR calc non Af Amer: >60 mL/min (ref >60)
Glucose, Bld: 109 mg/dL — ABNORMAL HIGH (ref 65–99)
Potassium: 3.6 mmol/L (ref 3.5–5.1)
Sodium: 141 mmol/L (ref 135–145)
Total Bilirubin: 0.3 mg/dL (ref 0.3–1.2)
Total Protein: 6.7 g/dL (ref 6.5–8.1)

## 2015-12-06 LAB — URINALYSIS COMPLETE WITH MICROSCOPIC (ARMC ONLY)
Bilirubin Urine: NEGATIVE
Glucose, UA: NEGATIVE mg/dL
Hgb urine dipstick: NEGATIVE
Ketones, ur: NEGATIVE mg/dL
Nitrite: NEGATIVE
Protein, ur: NEGATIVE mg/dL
Specific Gravity, Urine: 1.01 (ref 1.005–1.030)
Squamous Epithelial / LPF: NONE SEEN
pH: 7 (ref 5.0–8.0)

## 2015-12-06 LAB — CBC WITH DIFFERENTIAL/PLATELET
Basophils Absolute: 0.1 10*3/uL (ref 0–0.1)
Basophils Relative: 2 %
Eosinophils Absolute: 0.3 10*3/uL (ref 0–0.7)
Eosinophils Relative: 5 %
HCT: 35.8 % — ABNORMAL LOW (ref 40.0–52.0)
Hemoglobin: 11.5 g/dL — ABNORMAL LOW (ref 13.0–18.0)
Lymphocytes Relative: 23 %
Lymphs Abs: 1.3 10*3/uL (ref 1.0–3.6)
MCH: 28.5 pg (ref 26.0–34.0)
MCHC: 32.2 g/dL (ref 32.0–36.0)
MCV: 88.4 fL (ref 80.0–100.0)
Monocytes Absolute: 0.4 10*3/uL (ref 0.2–1.0)
Monocytes Relative: 7 %
Neutro Abs: 3.5 10*3/uL (ref 1.4–6.5)
Neutrophils Relative %: 63 %
Platelets: 202 10*3/uL (ref 150–440)
RBC: 4.04 MIL/uL — ABNORMAL LOW (ref 4.40–5.90)
RDW: 15.3 % — ABNORMAL HIGH (ref 11.5–14.5)
WBC: 5.4 10*3/uL (ref 3.8–10.6)

## 2015-12-06 LAB — TSH: TSH: 3.162 u[IU]/mL (ref 0.350–4.500)

## 2015-12-06 LAB — HEMOGLOBIN A1C: Hgb A1c MFr Bld: 5.4 % (ref 4.0–6.0)

## 2015-12-06 LAB — APTT: aPTT: 41 seconds — ABNORMAL HIGH (ref 24–36)

## 2015-12-06 LAB — PROTIME-INR
INR: 1
Prothrombin Time: 13.4 seconds (ref 11.4–15.0)

## 2015-12-06 MED ORDER — CLOPIDOGREL BISULFATE 75 MG PO TABS
75.0000 mg | ORAL_TABLET | Freq: Every day | ORAL | Status: DC
  Administered 2015-12-06: 75 mg via ORAL
  Filled 2015-12-06: qty 1

## 2015-12-06 MED ORDER — GADOBENATE DIMEGLUMINE 529 MG/ML IV SOLN
20.0000 mL | Freq: Once | INTRAVENOUS | Status: AC | PRN
  Administered 2015-12-06: 12:00:00 18 mL via INTRAVENOUS

## 2015-12-06 MED ORDER — ATORVASTATIN CALCIUM 40 MG PO TABS: 40.0000 mg | ORAL_TABLET | Freq: Every day | ORAL | Status: DC

## 2015-12-06 MED ORDER — STROKE: EARLY STAGES OF RECOVERY BOOK
Freq: Once | Status: AC
  Administered 2015-12-06: 03:00:00

## 2015-12-06 MED ORDER — APIXABAN 5 MG PO TABS: 5.0000 mg | ORAL_TABLET | Freq: Two times a day (BID) | ORAL | Status: DC

## 2015-12-06 MED ORDER — ALPRAZOLAM 0.5 MG PO TABS
0.5000 mg | ORAL_TABLET | Freq: Three times a day (TID) | ORAL | Status: DC | PRN
Start: 2015-12-06 — End: 2015-12-06
  Administered 2015-12-06 (×2): 0.5 mg via ORAL
  Filled 2015-12-06 (×2): qty 1

## 2015-12-06 MED ORDER — NICOTINE 21 MG/24HR TD PT24
21.0000 mg | MEDICATED_PATCH | Freq: Every day | TRANSDERMAL | Status: DC
  Filled 2015-12-06: qty 1

## 2015-12-06 MED ORDER — TRAMADOL HCL 50 MG PO TABS: 50.0000 mg | ORAL_TABLET | Freq: Four times a day (QID) | ORAL | Status: DC | PRN

## 2015-12-06 MED ORDER — ASPIRIN-DIPYRIDAMOLE ER 25-200 MG PO CP12: 1.0000 | ORAL_CAPSULE | Freq: Two times a day (BID) | ORAL | Status: DC

## 2015-12-06 MED ORDER — ASPIRIN-DIPYRIDAMOLE ER 25-200 MG PO CP12
1.0000 | ORAL_CAPSULE | Freq: Every day | ORAL | Status: DC
  Administered 2015-12-06: 10:00:00 1 via ORAL
  Filled 2015-12-06 (×2): qty 1

## 2015-12-06 MED ORDER — DOCUSATE SODIUM 100 MG PO CAPS
100.0000 mg | ORAL_CAPSULE | Freq: Two times a day (BID) | ORAL | Status: DC
  Administered 2015-12-06: 100 mg via ORAL
  Filled 2015-12-06: qty 1

## 2015-12-06 MED ORDER — CLOPIDOGREL BISULFATE 75 MG PO TABS: 75.0000 mg | ORAL_TABLET | Freq: Every day | ORAL | Status: DC

## 2015-12-06 MED ORDER — ACETAMINOPHEN 650 MG RE SUPP: 650.0000 mg | Freq: Four times a day (QID) | RECTAL | Status: DC | PRN

## 2015-12-06 MED ORDER — HEPARIN SODIUM (PORCINE) 5000 UNIT/ML IJ SOLN
5000.0000 [IU] | Freq: Three times a day (TID) | INTRAMUSCULAR | Status: DC
  Administered 2015-12-06 (×2): 5000 [IU] via SUBCUTANEOUS
  Filled 2015-12-06 (×2): qty 1

## 2015-12-06 MED ORDER — ACETAMINOPHEN 325 MG PO TABS: 650.0000 mg | ORAL_TABLET | Freq: Four times a day (QID) | ORAL | Status: DC | PRN

## 2015-12-06 MED ORDER — ONDANSETRON HCL 4 MG/2ML IJ SOLN: 4.0000 mg | Freq: Four times a day (QID) | INTRAMUSCULAR | Status: DC | PRN

## 2015-12-06 MED ORDER — SODIUM CHLORIDE 0.9 % IJ SOLN
3.0000 mL | Freq: Two times a day (BID) | INTRAMUSCULAR | Status: DC
  Administered 2015-12-06: 3 mL via INTRAVENOUS

## 2015-12-06 MED ORDER — ATORVASTATIN CALCIUM 20 MG PO TABS
40.0000 mg | ORAL_TABLET | Freq: Every day | ORAL | Status: DC
  Administered 2015-12-06: 40 mg via ORAL
  Filled 2015-12-06: qty 2

## 2015-12-06 MED ORDER — NICOTINE 21 MG/24HR TD PT24: 21.0000 mg | MEDICATED_PATCH | Freq: Every day | TRANSDERMAL | Status: DC

## 2015-12-06 MED ORDER — DEXTROSE-NACL 5-0.9 % IV SOLN
INTRAVENOUS | Status: DC
  Administered 2015-12-06: 03:00:00 via INTRAVENOUS

## 2015-12-06 MED ORDER — ALPRAZOLAM 0.5 MG PO TABS: 0.5000 mg | ORAL_TABLET | Freq: Three times a day (TID) | ORAL | Status: DC | PRN

## 2015-12-06 MED ORDER — ONDANSETRON HCL 4 MG PO TABS: 4.0000 mg | ORAL_TABLET | Freq: Four times a day (QID) | ORAL | Status: DC | PRN

## 2015-12-06 NOTE — Progress Notes (Signed)
SLP Cancellation Note  Patient Details Name: Michael Delacruz MRN: [id number] DOB: Jul 06, 1941   Cancelled treatment:       Reason Eval/Treat Not Completed: Patient at procedure or test/unavailable Spoke with nsg and reviewed chart. Pt is currently out of room for ultrasound and MRI. Per chart review and nsg pt is currently not demonstrating any difficulties with current diet (regular with thin) and is also able to communicate without any notable deficits. Will re-attempt evaluation as indicated when pt is available. Nsg in agreement.    [place],Maaran 12/06/2015, 11:47 AM

## 2015-12-06 NOTE — Plan of Care (Signed)
Problem: Education: Goal: Knowledge of disease or condition will improve Outcome: Progressing Pt involved in the plan of care. Stroke recovery booklet given.      Problem: Coping: Goal: Ability to identify strategies to decrease anxiety will improve Outcome: Progressing Xanax 0.5 mg given with relief   Problem: Health Behavior/Discharge Planning: Goal: Ability to manage health-related needs will improve Outcome: Progressing Pt expressed the need for financial assistance with home medications.   Problem: Bowel/Gastric: Goal: Will not experience complications related to bowel motility Outcome: Progressing NIH (0). No complaints of pain. Afib on the monitor. Pt with hx of afib. Up with standby assist.

## 2015-12-06 NOTE — H&P (Signed)
Michael Delacruz is an 74 y.o. male.   Chief Complaint: Weakness HPI: The patient presents emergency department complaining of weakness in his right hand. He states that he had been active today and packaging comic books in his garage after which he laid down to rest but awoke to find his right hand immobile. He states that he had sensation in the hand but could not move his fingers. Gradually motor function returns to his right hand after approximately 1-1/2 hours. Now he states that his hand feels somewhat swollen when he makes a fist but otherwise has no complaints. He denies chest pain, shortness of breath, difficulty swallowing or speaking during the symptoms involving his hand. 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 The patient is status post ablation as well as mitral valve annulus repair. Though his symptoms have resolved, his history of embolic phenomena or an evaluation which brought to the emergency Department to call for admission.  Past Medical History  Diagnosis Date  . COPD (chronic obstructive pulmonary disease) ([place])   . History of blood clots     eye   . Dilation of intestine 01/2015  . Asthma   . Severe mitral regurgitation   . Atrial fibrillation, persistent ([place]) 07/2015  . History of rheumatic fever   . Tobacco abuse   . Chronic diastolic congestive heart failure ([place])   . Anginal pain ([place])   . Hypertension   . Dysrhythmia   . Heart murmur   . Stroke ([place])   . Kidney stone   . History of hiatal hernia   . GERD (gastroesophageal reflux disease)   . Headache   . Arthritis   . Fibromyalgia   . S/P minimally invasive mitral valve repair 10/22/2015    Complex valvuloplasty including triangular resection of posterior leaflet, artificial Gore-tex neochord placement x6 and 38 mm Sorin Memo 3D Rechord ring annuloplasty via right minithoracotomy approach  . S/P Minimally invasive maze operation for atrial fibrillation  10/22/2015    Complete bilateral atrial lesion set using cryothermy and bipolar radiofrequency ablation with clipping of LA appendage via right mini thoracotomy approach    Past Surgical History  Procedure Laterality Date  . Ankle surgery    . Tee without cardioversion N/A 08/18/2015    Procedure: TRANSESOPHAGEAL ECHOCARDIOGRAM (TEE);  Surgeon: Wellington Hampshire, MD;  Location: [facility];  Service: Cardiovascular;  Laterality: N/A;  . Electrophysiologic study N/A 08/18/2015    Procedure: CARDIOVERSION;  Surgeon: Wellington Hampshire, MD;  Location: [facility];  Service: Cardiovascular;  Laterality: N/A;  . Cardiac catheterization N/A 10/03/2015    Procedure: Right and Left Heart Cath and Coronary Angiography;  Surgeon: [name], MD;  Location: [facility];  Service: Cardiovascular;  Laterality: N/A;  . Colonoscopy    . Sinus exploration    . Mitral valve repair Right 10/22/2015    Procedure: MINIMALLY INVASIVE MITRAL VALVE REPAIR (MVR);  Surgeon: Rexene Alberts, MD;  Location: [facility];  Service: Open Heart Surgery;  Laterality: Right;  . Minimally invasive maze procedure N/A 10/22/2015    Procedure: MINIMALLY INVASIVE MAZE PROCEDURE;  Surgeon: Rexene Alberts, MD;  Location: [facility];  Service: Open Heart Surgery;  Laterality: N/A;  . Tee without cardioversion N/A 10/22/2015    Procedure: TRANSESOPHAGEAL ECHOCARDIOGRAM (TEE);  Surgeon: Rexene Alberts, MD;  Location: [place];  Service: Open Heart Surgery;  Laterality: N/A;    Family History  Problem Relation  Age of Onset  . Family history unknown: Yes   Social History:  reports that he has been smoking Cigarettes.  He has a 26 pack-year smoking history. He does not have any smokeless tobacco history on file. He reports that he drinks about 0.5 oz of alcohol per week. He reports that he does not use illicit drugs.  Allergies:  Allergies  Allergen Reactions  . Codeine Nausea Only  . Macrodantin [Nitrofurantoin Macrocrystal] Rash  .  Morphine And Related Rash    Medications Prior to Admission  Medication Sig Dispense Refill  . ALPRAZolam (XANAX) 0.5 MG tablet Take 1 tablet (0.5 mg total) by mouth 3 (three) times daily as needed for anxiety or sleep. 30 tablet 0  . aspirin EC 81 MG tablet Take 1 tablet (81 mg total) by mouth daily. 90 tablet 3  . clopidogrel (PLAVIX) 75 MG tablet Take 1 tablet (75 mg total) by mouth daily. 90 tablet 3  . traMADol (ULTRAM) 50 MG tablet Take 1-2 tablets (50-100 mg total) by mouth every 6 (six) hours as needed for moderate pain. 40 tablet 0    Results for orders placed or performed during the hospital encounter of 12/05/15 (from the past 48 hour(s))  CBC with Differential/Platelet     Status: Abnormal   Collection Time: 12/05/15 11:23 PM  Result Value Ref Range   WBC 5.4 3.8 - 10.6 K/uL   RBC 4.04 (L) 4.40 - 5.90 MIL/uL   Hemoglobin 11.5 (L) 13.0 - 18.0 g/dL   HCT 35.8 (L) 40.0 - 52.0 %   MCV 88.4 80.0 - 100.0 fL   MCH 28.5 26.0 - 34.0 pg   MCHC 32.2 32.0 - 36.0 g/dL   RDW 15.3 (H) 11.5 - 14.5 %   Platelets 202 150 - 440 K/uL   Neutrophils Relative % 63 %   Neutro Abs 3.5 1.4 - 6.5 K/uL   Lymphocytes Relative 23 %   Lymphs Abs 1.3 1.0 - 3.6 K/uL   Monocytes Relative 7 %   Monocytes Absolute 0.4 0.2 - 1.0 K/uL   Eosinophils Relative 5 %   Eosinophils Absolute 0.3 0 - 0.7 K/uL   Basophils Relative 2 %   Basophils Absolute 0.1 0 - 0.1 K/uL  Comprehensive metabolic panel     Status: Abnormal   Collection Time: 12/05/15 11:23 PM  Result Value Ref Range   Sodium 141 135 - 145 mmol/L   Potassium 3.6 3.5 - 5.1 mmol/L   Chloride 107 101 - 111 mmol/L   CO2 29 22 - 32 mmol/L   Glucose, Bld 109 (H) 65 - 99 mg/dL   BUN 9 6 - 20 mg/dL   Creatinine, Ser 0.99 0.61 - 1.24 mg/dL   Calcium 9.3 8.9 - 10.3 mg/dL   Total Protein 6.7 6.5 - 8.1 g/dL   Albumin 3.3 (L) 3.5 - 5.0 g/dL   AST 12 (L) 15 - 41 U/L   ALT 10 (L) 17 - 63 U/L   Alkaline Phosphatase 75 38 - 126 U/L   Total Bilirubin 0.3  0.3 - 1.2 mg/dL   GFR calc non Af Amer >60 >60 mL/min   GFR calc Af Amer >60 >60 mL/min    Comment: (NOTE) The eGFR has been calculated using the CKD EPI equation. This calculation has not been validated in all clinical situations. eGFR's persistently <60 mL/min signify possible Chronic Kidney Disease.    Anion gap 5 5 - 15  Protime-INR     Status: None  Collection Time: 12/05/15 11:23 PM  Result Value Ref Range   Prothrombin Time 13.4 11.4 - 15.0 seconds   INR 1.00   APTT     Status: Abnormal   Collection Time: 12/05/15 11:23 PM  Result Value Ref Range   aPTT 41 (H) 24 - 36 seconds    Comment:        IF BASELINE aPTT IS ELEVATED, SUGGEST PATIENT RISK ASSESSMENT BE USED TO DETERMINE APPROPRIATE ANTICOAGULANT THERAPY.   Urinalysis complete, with microscopic ([facility] only)     Status: Abnormal   Collection Time: 12/06/15  2:09 AM  Result Value Ref Range   Color, Urine YELLOW (A) YELLOW   APPearance CLOUDY (A) CLEAR   Glucose, UA NEGATIVE NEGATIVE mg/dL   Bilirubin Urine NEGATIVE NEGATIVE   Ketones, ur NEGATIVE NEGATIVE mg/dL   Specific Gravity, Urine 1.010 1.005 - 1.030   Hgb urine dipstick NEGATIVE NEGATIVE   pH 7.0 5.0 - 8.0   Protein, ur NEGATIVE NEGATIVE mg/dL   Nitrite NEGATIVE NEGATIVE   Leukocytes, UA TRACE (A) NEGATIVE   RBC / HPF 0-5 0 - 5 RBC/hpf   WBC, UA 0-5 0 - 5 WBC/hpf   Bacteria, UA RARE (A) NONE SEEN   Squamous Epithelial / LPF NONE SEEN NONE SEEN   Mucous PRESENT    Amorphous Crystal PRESENT    Ct Head Wo Contrast  12/05/2015  CLINICAL DATA:  Fall. Sudden onset of right arm and hand numbness. Headache. EXAM: CT HEAD WITHOUT CONTRAST TECHNIQUE: Contiguous axial images were obtained from the base of the skull through the vertex without intravenous contrast. COMPARISON:  Head CT 11/05/2015 FINDINGS: Mild atrophy and chronic small vessel ischemic change, stable from prior exam. No intracranial hemorrhage, mass effect, or midline shift. No hydrocephalus. The  basilar cisterns are patent. No evidence of territorial infarct. No intracranial fluid collection. Atherosclerosis of skullbase vasculature noted. Calvarium is intact. Included paranasal sinuses and mastoid air cells are well aerated. IMPRESSION: Mild atrophy and chronic small vessel ischemic change without acute intracranial abnormality. Electronically Signed   By: Jeb Levering M.D.   On: 12/05/2015 23:56    Review of Systems  Constitutional: Negative for fever and chills.  HENT: Negative for sore throat and tinnitus.   Eyes: Negative for blurred vision and redness.  Respiratory: Negative for cough and shortness of breath.   Cardiovascular: Negative for chest pain, palpitations, orthopnea and PND.  Gastrointestinal: Negative for nausea, vomiting, abdominal pain and diarrhea.  Genitourinary: Negative for dysuria, urgency and frequency.  Musculoskeletal: Negative for myalgias and joint pain.  Skin: Negative for rash.       No lesions  Neurological: Negative for speech change, focal weakness and weakness.  Endo/Heme/Allergies: Does not bruise/bleed easily.       No temperature intolerance  Psychiatric/Behavioral: Negative for depression and suicidal ideas.    Blood pressure 136/85, pulse 61, temperature 97.8 F (36.6 C), temperature source Oral, resp. rate 17, height 6' 1"  (1.854 m), weight 87.136 kg (192 lb 1.6 oz), SpO2 97 %. Physical Exam  Nursing note and vitals reviewed. Constitutional: He is oriented to person, place, and time. He appears well-developed and well-nourished. No distress.  HENT:  Head: Normocephalic and atraumatic.  Mouth/Throat: Oropharynx is clear and moist.  Eyes: Conjunctivae and EOM are normal. Pupils are equal, round, and reactive to light. No scleral icterus.  Neck: Normal range of motion. Neck supple. No JVD present. No tracheal deviation present. No thyromegaly present.  Cardiovascular: Normal rate, regular rhythm and  normal heart sounds.  Exam reveals no  gallop and no friction rub.   No murmur heard. Respiratory: Effort normal and breath sounds normal.  GI: Soft. Bowel sounds are normal. He exhibits no distension. There is no tenderness.  Genitourinary:  Deferred  Musculoskeletal: Normal range of motion. He exhibits no edema.  Lymphadenopathy:    He has no cervical adenopathy.  Neurological: He is alert and oriented to person, place, and time. He has normal reflexes. No cranial nerve deficit. Coordination normal.  Right side grip strength and biceps strength slightly decreased compared to left  Skin: Skin is warm and dry. No rash noted. No erythema.  Psychiatric: He has a normal mood and affect. His behavior is normal. Judgment and thought content normal.     Assessment/Plan This is a 74 year old Caucasian male admitted for TIA symptoms; now resolved. 1. Transient cerebral ischemia: Resolved. Currently no neurologic deficits. Neurology on call suggested MRI/MRA of brain and extra cranial vessels given history of stroke. I have changed his daily aspirin to Aggrenox for now. Neurology to consult later today. 2. Atrial fibrillation: Status post ablation; EKG does not definitively showed sinus rhythm. The patient is not on systemic anticoagulation though he is on Plavix and aspirin. He recently underwent a transthoracic echocardiogram about 10 days ago. He may need TEE to fully rule out cardiac thrombus. I have made the patient nothing by mouth in case he needs to have this procedure. Await recommendations from neurology. 3. Essential hypertension: Controlled 4. CHF: Chronic, diastolic. 5. Tobacco abuse: NicoDerm patch 6. GI prophylaxis: None 7. DVT prophylaxis: Heparin The patient is a full code. Time spent on admission orders and patient care possibly 45 minutes  Harrie Foreman 12/06/2015, 3:11 AM

## 2015-12-06 NOTE — Progress Notes (Signed)
Pt asking about prescription for xanax. Pt reports he has "tried to buy them on the street". "It helps me;my mind." Dr. Margaretmary Eddy in to see pt with pt discharged home. Prescriptions given to pt by MD. Care management has delivered eliquis coupon to pt. Oral and written AVS done with pt. VSS. Neuro checks stable.

## 2015-12-06 NOTE — Discharge Instructions (Signed)
Diet healthy heart Activity as tolerated Start taking Eliquis from December 21 Follow-up with primary care physician in a week Follow-up with cardiology Dr. [name] in 5 days Follow-up with neurology in a month Ischemic Stroke Treated Without Warfarin An ischemic stroke (cerebrovascular accident) is the sudden death of brain tissue. It is a medical emergency. An ischemic stroke can cause permanent loss of brain function. This can cause problems with different parts of your body. CAUSES An ischemic stroke is caused by a decrease of oxygen supply to an area of your brain. It is usually the result of a small blood clot (embolus) or collection of cholesterol or fat (plaque) that blocks blood flow in the brain. An ischemic stroke can also be caused by blocked or damaged carotid arteries. RISK FACTORS  High blood pressure (hypertension).  High cholesterol.  Diabetes mellitus.  Heart disease.  The buildup of plaque in the blood vessels (peripheral artery disease or atherosclerosis).  The buildup of plaque in the blood vessels that provide blood and oxygen to the brain (carotid artery stenosis).  An abnormal heart rhythm (atrial fibrillation).  Obesity.  Smoking cigarettes.  Taking oral contraceptives, especially in combination with using tobacco.  Physical inactivity.  A diet that is high in fats, salt (sodium), and calories.  Excessive alcohol use.  Use of illegal drugs, especially cocaine and methamphetamine.  Being African American.  Being over the age of 94 years.  Family history of stroke.  Previous history of blood clots, stroke, TIA (transient ischemic attack), or heart attack.  Sickle cell disease. SIGNS AND SYMPTOMS These symptoms usually develop suddenly, or you may notice them after waking up from sleep. Symptoms may include sudden:  Weakness or numbness in your face, arm, or leg, especially on one side of your body.  Confusion.  Trouble speaking (aphasia)  or understanding speech.  Trouble seeing with one or both eyes.  Trouble walking or difficulty moving your arms or legs.  Dizziness.  Loss of balance or coordination.  Severe headache with no known cause. The headache is often described as the worst headache ever experienced. DIAGNOSIS Your health care provider can often determine the presence or absence of an ischemic stroke based on your symptoms, history, and physical exam. CT (computed tomography) of the brain is usually performed to confirm the stroke, determine causes, and determine stroke severity. Other tests may be done to find the cause of the stroke. These tests may include:  ECG (electrocardiogram).  Continuous heart monitoring.  Echocardiogram.  Carotid ultrasound.  MRI.  A scan of the brain circulation.  Blood tests. TREATMENT It is very important to seek treatment at the first sign of stroke symptoms. Your health care provider may perform the following treatments within 6 hours of the onset of stroke symptoms:  Medicine to dissolve the blood clot (thrombolytic).  Inserting a device into the affected artery to remove the blood clot. These treatments may not be effective if too much time has passed since your stroke symptoms began. Even if you do not know when your symptoms began, get treatment as soon as possible. There are other treatment options that may be given, such as:  Oxygen.  IV fluids.  Medicines to thin the blood (anticoagulants).  A procedure to widen blocked arteries. Your treatment will depend on how long you have had your symptoms, the severity of your symptoms, and the cause of your symptoms. Your health care provider will take measures to prevent short-term and long-term complications of stroke, such as:  Breathing foreign material into the lungs (aspiration pneumonia).  Blood clots in the legs.  Bedsores.  Falls. Medicines and dietary changes may be used to help treat and manage risk  factors for stroke, such as diabetes and high blood pressure. If any of your body's functions were impaired by stroke, you may work with physical, speech, or occupational therapists to help you recover. HOME CARE INSTRUCTIONS  Take medicines only as directed by your health care provider. Follow the directions carefully. Medicines may be used to control risk factors for a stroke. Be sure that you understand all your medicine instructions.  If swallow studies have determined that your swallowing reflex is present, you should eat healthy foods. Foods may need to be a soft or pureed consistency, or you may need to take small bites in order to avoid aspirating or choking.  Follow physical activity guidelines as directed by your health care team.  Do not use any tobacco products, including cigarettes, chewing tobacco, or electronic cigarettes. If you smoke, quit. If you need help quitting, ask your health care provider.  Limit or stop alcohol use.  A safe home environment is important to reduce the risk of falls. Your health care provider may arrange for specialists to evaluate your home. Having grab bars in the bedroom and bathroom is often important. Your health care provider may arrange for equipment to be used at home, such as raised toilets and a seat for the shower.  Ongoing physical, occupational, and speech therapy may be needed to maximize your recovery after a stroke. If you have been advised to use a walker or a cane, use it at all times. Be sure to keep your therapy appointments.  Keep all follow-up visits with your health care provider. This is very important. This includes any referrals, therapy, rehabilitation, and lab tests. Proper follow-up can prevent another stroke from occurring. PREVENTION The risk of a stroke can be decreased by appropriately treating high blood pressure, high cholesterol, diabetes, heart disease, and obesity. It can also be decreased by quitting smoking, limiting  alcohol, and staying physically active. SEEK IMMEDIATE MEDICAL CARE IF:  You have sudden weakness or numbness in your face, arm, or leg, especially on one side of your body.  You have sudden confusion.  You have sudden trouble speaking (aphasia) or understanding.  You have sudden trouble seeing with one or both eyes.  You have sudden trouble walking or difficulty moving your arms or legs.  You have sudden dizziness.  You have a sudden loss of balance or coordination.  You have a sudden, severe headache with no known cause.  You have a partial or total loss of consciousness. Any of these symptoms may represent a serious problem that is an emergency. Do not wait to see if the symptoms will go away. Get medical help right away. Call your local emergency services (911 in [place] Do not drive yourself to the hospital.   This information is not intended to replace advice given to you by your health care provider. Make sure you discuss any questions you have with your health care provider.   Document Released: 09/20/2014 Document Reviewed: 09/20/2014 Elsevier Interactive Patient Education [employer].

## 2015-12-06 NOTE — Discharge Summary (Signed)
[facility] [place] NAME: Michael Delacruz    MR#:  [id number]  DATE OF BIRTH:  [birth year]  DATE OF ADMISSION:  12/05/2015 ADMITTING PHYSICIAN: Harrie Foreman, MD  DATE OF DISCHARGE: 12/06/2015  PRIMARY CARE PHYSICIAN: No PCP Per Patient    ADMISSION DIAGNOSIS:  Transient cerebral ischemia, unspecified transient cerebral ischemia type [G45.9]  DISCHARGE DIAGNOSIS:   Acute CVA SECONDARY DIAGNOSIS:   Past Medical History  Diagnosis Date  . COPD (chronic obstructive pulmonary disease) ([place])   . History of blood clots     eye   . Dilation of intestine 01/2015  . Asthma   . Severe mitral regurgitation   . Atrial fibrillation, persistent ([place]) 07/2015  . History of rheumatic fever   . Tobacco abuse   . Chronic diastolic congestive heart failure ([place])   . Anginal pain ([place])   . Hypertension   . Dysrhythmia   . Heart murmur   . Stroke ([place])   . Kidney stone   . History of hiatal hernia   . GERD (gastroesophageal reflux disease)   . Headache   . Arthritis   . Fibromyalgia   . S/P minimally invasive mitral valve repair 10/22/2015    Complex valvuloplasty including triangular resection of posterior leaflet, artificial Gore-tex neochord placement x6 and 38 mm Sorin Memo 3D Rechord ring annuloplasty via right minithoracotomy approach  . S/P Minimally invasive maze operation for atrial fibrillation 10/22/2015    Complete bilateral atrial lesion set using cryothermy and bipolar radiofrequency ablation with clipping of LA appendage via right mini thoracotomy approach    HOSPITAL COURSE:   This is a 74 year old Caucasian male admitted for TIA symptoms; now resolved. 1. Acute left precentral gyrus stroke-  back to his baseline Neurology  has recommended to continue aspirin 81 mg by mouth once daily  Patient is to continue Plavix 75 mg for 5 more days until December 21, which need to be discontinued once he starts taking Eliquis  from December 21 Echocardiogram was performed on 11/25/2015 with normal ejection fraction 55-60%. Carotid Dopplers with less than 50% stenosis Past bedside swallow evaluation Patient is started on Lipitor for elevated LDL Evaluated by physical therapy with no PT needs as an outpatient  2. Chronic Atrial fibrillation: Rate controlled, Status post ablation; telemetry with chronic atrial fibrillation  The patient is not on systemic anticoagulation though he is on Plavix and aspirin. He stopped taking Coumadin  He recently underwent a transthoracic echocardiogram about 10 days ago.  We will start the patient on Eliquis  from December 21 as recommended by neurology and [facility] cardiology  Patient is to follow-up with Dr. [name] as an outpatient in 5 days  3. Essential hypertension: Controlled  4. CHF: Chronic, diastolic.  5. Tobacco abuse: Counseled patient to quit smoking for 4-5 min. Plan is to continue NicoDerm patch Generalized anxiety-continue Xanax-refill prescription for 20 6. GI prophylaxis: None 7. DVT prophylaxis: Heparin  DISCHARGE CONDITIONS:   fair  CONSULTS OBTAINED:  Treatment Team:  Leotis Pain, MD   PROCEDURES none  DRUG ALLERGIES:   Allergies  Allergen Reactions  . Codeine Nausea Only  . Macrodantin [Nitrofurantoin Macrocrystal] Rash  . Morphine And Related Rash    DISCHARGE MEDICATIONS:   Current Discharge Medication List    START taking these medications   Details  apixaban (ELIQUIS) 5 MG TABS tablet Take 1 tablet (5 mg total) by mouth 2 (two) times daily. Qty: 60 tablet, Refills:  0    atorvastatin (LIPITOR) 40 MG tablet Take 1 tablet (40 mg total) by mouth daily at 6 PM. Qty: 30 tablet, Refills: 0    nicotine (NICODERM CQ - DOSED IN MG/24 HOURS) 21 mg/24hr patch Place 1 patch (21 mg total) onto the skin daily. Qty: 28 patch, Refills: 0      CONTINUE these medications which have CHANGED   Details  ALPRAZolam (XANAX) 0.5 MG tablet Take  1 tablet (0.5 mg total) by mouth 3 (three) times daily as needed for anxiety or sleep. Qty: 20 tablet, Refills: 0   Associated Diagnoses: S/P MVR (mitral valve repair)    clopidogrel (PLAVIX) 75 MG tablet Take 1 tablet (75 mg total) by mouth daily. Qty: 5 tablet, Refills: 0      CONTINUE these medications which have NOT CHANGED   Details  aspirin EC 81 MG tablet Take 1 tablet (81 mg total) by mouth daily. Qty: 90 tablet, Refills: 3    traMADol (ULTRAM) 50 MG tablet Take 1-2 tablets (50-100 mg total) by mouth every 6 (six) hours as needed for moderate pain. Qty: 40 tablet, Refills: 0   Associated Diagnoses: Post-op pain         DISCHARGE INSTRUCTIONS:   Diet healthy heart Activity as tolerated Start taking Eliquis from December 21 Follow-up with primary care physician in a week Follow-up with cardiology Dr. [name] in 5 days Follow-up with neurology in a month   DIET:  Cardiac diet  DISCHARGE CONDITION:  Fair  ACTIVITY:  Activity as tolerated  OXYGEN:  Home Oxygen: No.   Oxygen Delivery: room air  DISCHARGE LOCATION:  home   If you experience worsening of your admission symptoms, develop shortness of breath, life threatening emergency, suicidal or homicidal thoughts you must seek medical attention immediately by calling 911 or calling your MD immediately  if symptoms less severe.  You Must read complete instructions/literature along with all the possible adverse reactions/side effects for all the Medicines you take and that have been prescribed to you. Take any new Medicines after you have completely understood and accpet all the possible adverse reactions/side effects.   Please note  You were cared for by a hospitalist during your hospital stay. If you have any questions about your discharge medications or the care you received while you were in the hospital after you are discharged, you can call the unit and asked to speak with the hospitalist on call if the  hospitalist that took care of you is not available. Once you are discharged, your primary care physician will handle any further medical issues. Please note that NO REFILLS for any discharge medications will be authorized once you are discharged, as it is imperative that you return to your primary care physician (or establish a relationship with a primary care physician if you do not have one) for your aftercare needs so that they can reassess your need for medications and monitor your lab values.     Today  Chief Complaint  Patient presents with  . Numbness   Patient is feeling fine. Denies any blurry vision or swallowing difficulty. Has chronic intermittent episodes of headache since he had cardiac surgery. Still has some right-sided weakness but ambulating in the hallway without any difficulty. Physical therapy has recommended no PT needs. Patient used to take Coumadin for chronic atrial fibrillation but stopped taking it. Patient sees Dr. Candis Musa as an outpatient. Reporting anxiety and asking for Xanax. Telemetry has revealed chronic atrial fibrillation with heart rate  at 66  ROS:  CONSTITUTIONAL: Denies fevers, chills. Denies any fatigue, weakness.  EYES: Denies blurry vision, double vision, eye pain. EARS, NOSE, THROAT: Denies tinnitus, ear pain, hearing loss. RESPIRATORY: Denies cough, wheeze, shortness of breath.  CARDIOVASCULAR: Denies chest pain, palpitations, edema.  GASTROINTESTINAL: Denies nausea, vomiting, diarrhea, abdominal pain. Denies bright red blood per rectum. GENITOURINARY: Denies dysuria, hematuria. ENDOCRINE: Denies nocturia or thyroid problems. HEMATOLOGIC AND LYMPHATIC: Denies easy bruising or bleeding. SKIN: Denies rash or lesion. MUSCULOSKELETAL: Denies pain in neck, back, shoulder, knees, hips or arthritic symptoms.  NEUROLOGIC: Denies paralysis, paresthesias.  PSYCHIATRIC: Denies anxiety or depressive symptoms.   VITAL SIGNS:  Blood pressure 152/78, pulse 55,  temperature 97.5 F (36.4 C), temperature source Oral, resp. rate 20, height 6\' 1"  (1.854 m), weight 87.136 kg (192 lb 1.6 oz), SpO2 100 %.  I/O:  No intake or output data in the 24 hours ending 12/06/15 1545  PHYSICAL EXAMINATION:  GENERAL:  74 y.o.-year-old patient lying in the bed with no acute distress.  EYES: Pupils equal, round, reactive to light and accommodation. No scleral icterus. Extraocular muscles intact.  HEENT: Head atraumatic, normocephalic. Oropharynx and nasopharynx clear.  NECK:  Supple, no jugular venous distention. No thyroid enlargement, no tenderness.  LUNGS: Normal breath sounds bilaterally, no wheezing, rales,rhonchi or crepitation. No use of accessory muscles of respiration.  CARDIOVASCULAR: S1, S2 normal. No murmurs, rubs, or gallops.  ABDOMEN: Soft, non-tender, non-distended. Bowel sounds present. No organomegaly or mass.  EXTREMITIES: No pedal edema, cyanosis, or clubbing.  NEUROLOGIC: Cranial nerves II through XII are intact. Muscle strength 5/5 in all extremities. Sensation intact. Gait not checked.  PSYCHIATRIC: The patient is alert and oriented x 3.  SKIN: No obvious rash, lesion, or ulcer.   DATA REVIEW:   CBC  Recent Labs Lab 12/05/15 2323  WBC 5.4  HGB 11.5*  HCT 35.8*  PLT 202    Chemistries   Recent Labs Lab 12/05/15 2323  NA 141  K 3.6  CL 107  CO2 29  GLUCOSE 109*  BUN 9  CREATININE 0.99  CALCIUM 9.3  AST 12*  ALT 10*  ALKPHOS 75  BILITOT 0.3    Cardiac Enzymes No results for input(s): TROPONINI in the last 168 hours.  Microbiology Results  Results for orders placed or performed during the hospital encounter of 10/20/15  Surgical pcr screen     Status: None   Collection Time: 10/20/15 12:58 PM  Result Value Ref Range Status   MRSA, PCR NEGATIVE NEGATIVE Final   Staphylococcus aureus NEGATIVE NEGATIVE Final    Comment:        The Xpert SA Assay (FDA approved for NASAL specimens in patients over 21 years of  age), is one component of a comprehensive surveillance program.  Test performance has been validated by [facility] for patients greater than or equal to 69 year old. It is not intended to diagnose infection nor to guide or monitor treatment.     RADIOLOGY:  Ct Head Wo Contrast  12/05/2015  CLINICAL DATA:  Fall. Sudden onset of right arm and hand numbness. Headache. EXAM: CT HEAD WITHOUT CONTRAST TECHNIQUE: Contiguous axial images were obtained from the base of the skull through the vertex without intravenous contrast. COMPARISON:  Head CT 11/05/2015 FINDINGS: Mild atrophy and chronic small vessel ischemic change, stable from prior exam. No intracranial hemorrhage, mass effect, or midline shift. No hydrocephalus. The basilar cisterns are patent. No evidence of territorial infarct. No intracranial fluid collection. Atherosclerosis of  skullbase vasculature noted. Calvarium is intact. Included paranasal sinuses and mastoid air cells are well aerated. IMPRESSION: Mild atrophy and chronic small vessel ischemic change without acute intracranial abnormality. Electronically Signed   By: Jeb Levering M.D.   On: 12/05/2015 23:56   Mr Angiogram Neck W Wo Contrast  12/06/2015  CLINICAL DATA:  New onset weakness in the right hand. Acute infarct. EXAM: MRA NECK WITHOUT AND WITH CONTRAST TECHNIQUE: Multiplanar and multiecho pulse sequences of the neck were obtained without and with intravenous contrast. Angiographic images of the neck were obtained using MRA technique without and with intravenous contrast. CONTRAST:  31mL MULTIHANCE GADOBENATE DIMEGLUMINE 529 MG/ML IV SOLN COMPARISON:  MRI brain from the same day. FINDINGS: The time-of-flight images demonstrate no significant flow disturbance within the carotid bifurcation. Flow is antegrade in the vertebral arteries bilaterally. Postcontrast images demonstrate a 3 vessel arch configuration. There is some signal loss in the proximal great vessels due to  artifact. The right common carotid artery is within normal limits. The bifurcation is unremarkable. There is some tortuosity of the distal cervical right ICA without significant stenosis. The left common carotid artery is within normal limits. Bifurcation is unremarkable. Cervical left ICA is normal The vertebral arteries are codominant. There are no focal stenoses in the neck. IMPRESSION: 1. Negative MRA of the neck. Electronically Signed   By: San Morelle M.D.   On: 12/06/2015 13:43   Mr Brain Wo Contrast  12/06/2015  CLINICAL DATA:  New onset of right hand weakness after waking from a nap. EXAM: MRI HEAD WITHOUT CONTRAST TECHNIQUE: Multiplanar, multiecho pulse sequences of the brain and surrounding structures were obtained without intravenous contrast. COMPARISON:  CT head without contrast fall/ 16/16. FINDINGS: An acute punctate cortical infarct is present within the left precentral gyrus along the primary motor strip. This corresponds with the area of activity for the hand. T2 shine through is present within the white matter the left corona radiata. No other acute infarcts are present. Minimal white matter changes are associated with the acute infarct. Mild to moderate periventricular and scattered subcortical T2 changes are present otherwise. No acute hemorrhage or mass lesion is present. The ventricles are proportionate to the degree of atrophy. No significant extra-axial fluid collection is present. Internal auditory canals are within normal limits bilaterally. Flow is present in the major intracranial arteries. Bilateral lens replacements are noted. The globes and orbits are otherwise intact. The paranasal sinuses and mastoid air cells are clear. Skullbase is within normal limits. Midline and sagittal images are unremarkable. IMPRESSION: 1. Acute punctate non hemorrhagic cortical infarct within left precentral gyrus, along the primary motor cortex. This corresponds with the control area for the  hand. 2. Mild to moderate periventricular and subcortical white matter changes otherwise. These likely reflect the sequela of chronic microvascular ischemia. These results will be called to the ordering clinician or representative by the Radiologist Assistant, and communication documented in the PACS or zVision Dashboard. Electronically Signed   By: San Morelle M.D.   On: 12/06/2015 12:28   US Carotid Bilateral  12/06/2015  CLINICAL DATA:  TIA, history COPD, asthma, atrial fibrillation, chronic diastolic CHF, hypertension, stroke, MVR, smoking EXAM: BILATERAL CAROTID DUPLEX ULTRASOUND TECHNIQUE: Pearline Cables scale imaging, color Doppler and duplex ultrasound were performed of bilateral carotid and vertebral arteries in the neck. COMPARISON:  06/06/2010 FINDINGS: Criteria: Quantification of carotid stenosis is based on velocity parameters that correlate the residual internal carotid diameter with NASCET-based stenosis levels, using the diameter of the distal  internal carotid lumen as the denominator for stenosis measurement. The following velocity measurements were obtained: RIGHT ICA:  86/27 cm/sec CCA:  [id number] cm/sec SYSTOLIC ICA/CCA RATIO:  0.9 DIASTOLIC ICA/CCA RATIO:  1.2 ECA:  80 cm/sec LEFT ICA:  82/30 cm/sec CCA:  [id number] cm/sec SYSTOLIC ICA/CCA RATIO:  0.8 DIASTOLIC ICA/CCA RATIO:  1.3 ECA:  69 cm/sec RIGHT CAROTID ARTERY: Echogenic non shadowing plaque at RIGHT carotid bulb into proximal RIGHT ICA with associated mildly turbulent blood flow. Spectral broadening RIGHT carotid bulb into proximal RIGHT ICA on waveform analysis. Patient arrhythmic. Distal RIGHT ICA tortuous and deep. No high velocity jets. RIGHT VERTEBRAL ARTERY:  Patent, antegrade LEFT CAROTID ARTERY: Echogenic non shadowing plaque distal LEFT carotid bulb into proximal LEFT ICA. Tortuous LEFT carotid system. Mildly turbulent blood flow at bifurcation. Spectral broadening LEFT carotid bulb and proximal LEFT ICA on waveform analysis. No high  velocity jets. Patient arrhythmic. LEFT VERTEBRAL ARTERY:  Patent, antegrade IMPRESSION: Plaque formation at the carotid bifurcations bilaterally. Velocity measurements correspond to less than 50% diameter stenoses bilaterally. Arrhythmia. Electronically Signed   By: Lavonia Dana M.D.   On: 12/06/2015 13:00   Mr Lovenia Kim  12/06/2015  CLINICAL DATA:  Acute onset of right hand weakness. Punctate acute infarct on MRI scan. EXAM: MRA HEAD WITHOUT CONTRAST TECHNIQUE: Angiographic images of the Circle of Willis were obtained using MRA technique without intravenous contrast. COMPARISON:  MRI brain without contrast from the same day. FINDINGS: There is moderate tortuosity of the distal cervical internal carotid arteries bilaterally. Matter as no focal stenosis. The internal carotid arteries are otherwise within normal limits to the ICA termini. The left A1 segment is dominant. Early bifurcation of the left MCA is within normal limits. MCA bifurcations are otherwise within normal limits bilaterally. The ACA and MCA branch vessels are within normal limits. The vertebral arteries are codominant. The PICA origins are visualized and normal. The basilar artery is normal. Both posterior cerebral arteries originate from the basilar tip. The PCA branch vessels are intact. IMPRESSION: Normal variant MRA circle of Willis without evidence for significant proximal stenosis, aneurysm, or branch vessel occlusion. Mild tortuosity of the distal cervical internal carotid arteries without associated stenosis. Electronically Signed   By: San Morelle M.D.   On: 12/06/2015 14:03    EKG:   Orders placed or performed during the hospital encounter of 12/05/15  . EKG 12-Lead  . EKG 12-Lead      Management plans discussed with the patient, family and they are in agreement.  CODE STATUS:     Code Status Orders        Start     Ordered   12/06/15 0238  Full code   Continuous     12/06/15 0237      TOTAL TIME  TAKING CARE OF THIS PATIENT: 45 minutes.    @[facility] @  on 12/06/2015 at 3:45 PM  Between 7am to 6pm - Pager - [phone]  After 6pm go to [email or web address] - password [email or web address] [facility]  [facility]  Office  [phone]  CC: Primary care physician; No PCP Per Patient

## 2015-12-06 NOTE — Consult Note (Signed)
CC: RUE weakness   HPI: Michael Delacruz is an 74 y.o. male with history of A-fib was previously on coumadin, s/p ablation in November presenting with 15-30 minute history of inability to move distal RUE. Symptoms resolved. Pt has L precental gyrus stroke.  Close to baseline now.    Past Medical History  Diagnosis Date  . COPD (chronic obstructive pulmonary disease) ([place])   . History of blood clots     eye   . Dilation of intestine 01/2015  . Asthma   . Severe mitral regurgitation   . Atrial fibrillation, persistent ([place]) 07/2015  . History of rheumatic fever   . Tobacco abuse   . Chronic diastolic congestive heart failure ([place])   . Anginal pain ([place])   . Hypertension   . Dysrhythmia   . Heart murmur   . Stroke ([place])   . Kidney stone   . History of hiatal hernia   . GERD (gastroesophageal reflux disease)   . Headache   . Arthritis   . Fibromyalgia   . S/P minimally invasive mitral valve repair 10/22/2015    Complex valvuloplasty including triangular resection of posterior leaflet, artificial Gore-tex neochord placement x6 and 38 mm Sorin Memo 3D Rechord ring annuloplasty via right minithoracotomy approach  . S/P Minimally invasive maze operation for atrial fibrillation 10/22/2015    Complete bilateral atrial lesion set using cryothermy and bipolar radiofrequency ablation with clipping of LA appendage via right mini thoracotomy approach    Past Surgical History  Procedure Laterality Date  . Ankle surgery    . Tee without cardioversion N/A 08/18/2015    Procedure: TRANSESOPHAGEAL ECHOCARDIOGRAM (TEE);  Surgeon: Wellington Hampshire, MD;  Location: [facility];  Service: Cardiovascular;  Laterality: N/A;  . Electrophysiologic study N/A 08/18/2015    Procedure: CARDIOVERSION;  Surgeon: Wellington Hampshire, MD;  Location: [facility];  Service: Cardiovascular;  Laterality: N/A;  . Cardiac catheterization N/A 10/03/2015    Procedure: Right and Left Heart Cath and Coronary Angiography;  Surgeon:  [name], MD;  Location: [place];  Service: Cardiovascular;  Laterality: N/A;  . Colonoscopy    . Sinus exploration    . Mitral valve repair Right 10/22/2015    Procedure: MINIMALLY INVASIVE MITRAL VALVE REPAIR (MVR);  Surgeon: Rexene Alberts, MD;  Location: [place];  Service: Open Heart Surgery;  Laterality: Right;  . Minimally invasive maze procedure N/A 10/22/2015    Procedure: MINIMALLY INVASIVE MAZE PROCEDURE;  Surgeon: Rexene Alberts, MD;  Location: [place];  Service: Open Heart Surgery;  Laterality: N/A;  . Tee without cardioversion N/A 10/22/2015    Procedure: TRANSESOPHAGEAL ECHOCARDIOGRAM (TEE);  Surgeon: Rexene Alberts, MD;  Location: [place];  Service: Open Heart Surgery;  Laterality: N/A;    Family History  Problem Relation Age of Onset  . Hypertension Other     Social History:  reports that he has been smoking Cigarettes.  He has a 26 pack-year smoking history. He does not have any smokeless tobacco history on file. He reports that he drinks about 0.5 oz of alcohol per week. He reports that he does not use illicit drugs.  Allergies  Allergen Reactions  . Codeine Nausea Only  . Macrodantin [Nitrofurantoin Macrocrystal] Rash  . Morphine And Related Rash    Medications: I have reviewed the patient's current medications.  ROS: History obtained from the patient  General ROS: negative for - chills, fatigue, fever, night sweats, weight gain or weight loss Psychological  ROS: negative for - behavioral disorder, hallucinations, memory difficulties, mood swings or suicidal ideation Ophthalmic ROS: negative for - blurry vision, double vision, eye pain or loss of vision ENT ROS: negative for - epistaxis, nasal discharge, oral lesions, sore throat, tinnitus or vertigo Allergy and Immunology ROS: negative for - hives or itchy/watery eyes Hematological and Lymphatic ROS: negative for - bleeding problems, bruising or swollen lymph nodes Endocrine ROS: negative for -  galactorrhea, hair pattern changes, polydipsia/polyuria or temperature intolerance Respiratory ROS: negative for - cough, hemoptysis, shortness of breath or wheezing Cardiovascular ROS: negative for - chest pain, dyspnea on exertion, edema or irregular heartbeat Gastrointestinal ROS: negative for - abdominal pain, diarrhea, hematemesis, nausea/vomiting or stool incontinence Genito-Urinary ROS: negative for - dysuria, hematuria, incontinence or urinary frequency/urgency Musculoskeletal ROS: negative for - joint swelling or muscular weakness Neurological ROS: as noted in HPI Dermatological ROS: negative for rash and skin lesion changes  Physical Examination: Blood pressure 139/65, pulse 59, temperature 97.7 F (36.5 C), temperature source Oral, resp. rate 20, height 6\' 1"  (1.854 m), weight 192 lb 1.6 oz (87.136 kg), SpO2 98 %.   Neurological Examination Mental Status: Alert, oriented, thought content appropriate.  Speech fluent without evidence of aphasia.  Able to follow 3 step commands without difficulty. Cranial Nerves: II: Discs flat bilaterally; Visual fields grossly normal, pupils equal, round, reactive to light and accommodation III,IV, VI: ptosis not present, extra-ocular motions intact bilaterally V,VII: smile symmetric, facial light touch sensation normal bilaterally VIII: hearing normal bilaterally IX,X: gag reflex present XI: bilateral shoulder shrug XII: midline tongue extension Motor: Right : Upper extremity   5/5    Left:     Upper extremity   5/5  Lower extremity   5/5     Lower extremity   5/5 Tone and bulk:normal tone throughout; no atrophy noted Sensory: Pinprick and light touch intact throughout, bilaterally Deep Tendon Reflexes: 2+ and symmetric throughout Plantars: Right: downgoing   Left: downgoing Cerebellar: normal finger-to-nose, normal rapid alternating movements and normal heel-to-shin test Gait: normal gait and station      Laboratory Studies:    Basic Metabolic Panel:  Recent Labs Lab 12/05/15 2323  NA 141  K 3.6  CL 107  CO2 29  GLUCOSE 109*  BUN 9  CREATININE 0.99  CALCIUM 9.3    Liver Function Tests:  Recent Labs Lab 12/05/15 2323  AST 12*  ALT 10*  ALKPHOS 75  BILITOT 0.3  PROT 6.7  ALBUMIN 3.3*   No results for input(s): LIPASE, AMYLASE in the last 168 hours. No results for input(s): AMMONIA in the last 168 hours.  CBC:  Recent Labs Lab 12/05/15 2323  WBC 5.4  NEUTROABS 3.5  HGB 11.5*  HCT 35.8*  MCV 88.4  PLT 202    Cardiac Enzymes: No results for input(s): CKTOTAL, CKMB, CKMBINDEX, TROPONINI in the last 168 hours.  BNP: Invalid input(s): POCBNP  CBG: No results for input(s): GLUCAP in the last 168 hours.  Microbiology: Results for orders placed or performed during the hospital encounter of 10/20/15  Surgical pcr screen     Status: None   Collection Time: 10/20/15 12:58 PM  Result Value Ref Range Status   MRSA, PCR NEGATIVE NEGATIVE Final   Staphylococcus aureus NEGATIVE NEGATIVE Final    Comment:        The Xpert SA Assay (FDA approved for NASAL specimens in patients over 77 years of age), is one component of a comprehensive surveillance program.  Test performance has  been validated by [facility] for patients greater than or equal to 31 year old. It is not intended to diagnose infection nor to guide or monitor treatment.     Coagulation Studies:  Recent Labs  12/05/15 2323  LABPROT 13.4  INR 1.00    Urinalysis:  Recent Labs Lab 12/06/15 0209  COLORURINE YELLOW*  LABSPEC 1.010  PHURINE 7.0  GLUCOSEU NEGATIVE  HGBUR NEGATIVE  BILIRUBINUR NEGATIVE  KETONESUR NEGATIVE  PROTEINUR NEGATIVE  NITRITE NEGATIVE  LEUKOCYTESUR TRACE*    Lipid Panel:     Component Value Date/Time   CHOL 169 12/06/2015 0453   TRIG 85 12/06/2015 0453   HDL 36* 12/06/2015 0453   CHOLHDL 4.7 12/06/2015 0453   VLDL 17 12/06/2015 0453   LDLCALC 116* 12/06/2015 0453     HgbA1C:  Lab Results  Component Value Date   HGBA1C 5.6 10/20/2015    Urine Drug Screen:  No results found for: LABOPIA, COCAINSCRNUR, LABBENZ, AMPHETMU, THCU, LABBARB  Alcohol Level: No results for input(s): ETH in the last 168 hours.  Other results:   Imaging: Ct Head Wo Contrast  12/05/2015  CLINICAL DATA:  Fall. Sudden onset of right arm and hand numbness. Headache. EXAM: CT HEAD WITHOUT CONTRAST TECHNIQUE: Contiguous axial images were obtained from the base of the skull through the vertex without intravenous contrast. COMPARISON:  Head CT 11/05/2015 FINDINGS: Mild atrophy and chronic small vessel ischemic change, stable from prior exam. No intracranial hemorrhage, mass effect, or midline shift. No hydrocephalus. The basilar cisterns are patent. No evidence of territorial infarct. No intracranial fluid collection. Atherosclerosis of skullbase vasculature noted. Calvarium is intact. Included paranasal sinuses and mastoid air cells are well aerated. IMPRESSION: Mild atrophy and chronic small vessel ischemic change without acute intracranial abnormality. Electronically Signed   By: Jeb Levering M.D.   On: 12/05/2015 23:56   Mr Brain Wo Contrast  12/06/2015  CLINICAL DATA:  New onset of right hand weakness after waking from a nap. EXAM: MRI HEAD WITHOUT CONTRAST TECHNIQUE: Multiplanar, multiecho pulse sequences of the brain and surrounding structures were obtained without intravenous contrast. COMPARISON:  CT head without contrast fall/ 16/16. FINDINGS: An acute punctate cortical infarct is present within the left precentral gyrus along the primary motor strip. This corresponds with the area of activity for the hand. T2 shine through is present within the white matter the left corona radiata. No other acute infarcts are present. Minimal white matter changes are associated with the acute infarct. Mild to moderate periventricular and scattered subcortical T2 changes are present otherwise.  No acute hemorrhage or mass lesion is present. The ventricles are proportionate to the degree of atrophy. No significant extra-axial fluid collection is present. Internal auditory canals are within normal limits bilaterally. Flow is present in the major intracranial arteries. Bilateral lens replacements are noted. The globes and orbits are otherwise intact. The paranasal sinuses and mastoid air cells are clear. Skullbase is within normal limits. Midline and sagittal images are unremarkable. IMPRESSION: 1. Acute punctate non hemorrhagic cortical infarct within left precentral gyrus, along the primary motor cortex. This corresponds with the control area for the hand. 2. Mild to moderate periventricular and subcortical white matter changes otherwise. These likely reflect the sequela of chronic microvascular ischemia. These results will be called to the ordering clinician or representative by the Radiologist Assistant, and communication documented in the PACS or zVision Dashboard. Electronically Signed   By: San Morelle M.D.   On: 12/06/2015 12:28   US Carotid Bilateral  12/06/2015  CLINICAL DATA:  TIA, history COPD, asthma, atrial fibrillation, chronic diastolic CHF, hypertension, stroke, MVR, smoking EXAM: BILATERAL CAROTID DUPLEX ULTRASOUND TECHNIQUE: Pearline Cables scale imaging, color Doppler and duplex ultrasound were performed of bilateral carotid and vertebral arteries in the neck. COMPARISON:  06/06/2010 FINDINGS: Criteria: Quantification of carotid stenosis is based on velocity parameters that correlate the residual internal carotid diameter with NASCET-based stenosis levels, using the diameter of the distal internal carotid lumen as the denominator for stenosis measurement. The following velocity measurements were obtained: RIGHT ICA:  86/27 cm/sec CCA:  123XX123 cm/sec SYSTOLIC ICA/CCA RATIO:  0.9 DIASTOLIC ICA/CCA RATIO:  1.2 ECA:  80 cm/sec LEFT ICA:  82/30 cm/sec CCA:  123456 cm/sec SYSTOLIC ICA/CCA  RATIO:  0.8 DIASTOLIC ICA/CCA RATIO:  1.3 ECA:  69 cm/sec RIGHT CAROTID ARTERY: Echogenic non shadowing plaque at RIGHT carotid bulb into proximal RIGHT ICA with associated mildly turbulent blood flow. Spectral broadening RIGHT carotid bulb into proximal RIGHT ICA on waveform analysis. Patient arrhythmic. Distal RIGHT ICA tortuous and deep. No high velocity jets. RIGHT VERTEBRAL ARTERY:  Patent, antegrade LEFT CAROTID ARTERY: Echogenic non shadowing plaque distal LEFT carotid bulb into proximal LEFT ICA. Tortuous LEFT carotid system. Mildly turbulent blood flow at bifurcation. Spectral broadening LEFT carotid bulb and proximal LEFT ICA on waveform analysis. No high velocity jets. Patient arrhythmic. LEFT VERTEBRAL ARTERY:  Patent, antegrade IMPRESSION: Plaque formation at the carotid bifurcations bilaterally. Velocity measurements correspond to less than 50% diameter stenoses bilaterally. Arrhythmia. Electronically Signed   By: Lavonia Dana M.D.   On: 12/06/2015 13:00     Assessment/Plan:   74 y.o. male with history of A-fib was previously on coumadin, s/p ablation in November presenting with 15-30 minute history of inability to move distal RUE. Symptoms resolved. Pt has L precental gyrus stroke.  Close to baseline now.    Tele monitoring. If there is still evidence of A-fib, would need anticoagulation. Did not do well with coumadin in the past If A-fib present would start Eliquis If no signs of A-fib which I believe I saw briefly then would need TEE and holter monitor Please have cardiology follow this pt as he is s/p ablation If needed anticoagulation would wait 5 days post stroke to reduce hemorrhagic conversion.  Leotis Pain  12/06/2015, 1:10 PM

## 2015-12-06 NOTE — Progress Notes (Signed)
TC verbal report of entire MRI result called to Dr. Margaretmary Eddy. Wants neuro consult modified and to contact neuro to see pt. Dr. Irish Elders advised verbally of + MRI result with Dr. Margaretmary Eddy requesting consult with pt currently in MRI or [place] at this time.

## 2015-12-06 NOTE — Plan of Care (Signed)
Problem: Education: Goal: Knowledge of [place] [facility] Education information/materials will improve Outcome: Completed/Met Date Met:  12/06/15 Education completed. Oral and written Stroke education done. Smoking cessation education done with pt unmotivated to cess smoking with resources given. Refused nicotine patch.  Problem: Health Behavior/Discharge Planning: Goal: Ability to manage health-related needs will improve Outcome: Progressing Education done on complicance and healthy lifestyles. Pt reports having some anxiety with xanax given with partial relief. Offered spiritual consult with pt declining.   Problem: Pain Managment: Goal: General experience of comfort will improve Outcome: Progressing Reports comfortable/denies pain.   Problem: Skin Integrity: Goal: Risk for impaired skin integrity will decrease Outcome: Completed/Met Date Met:  12/06/15 No skin issues requiring care.  Problem: Tissue Perfusion: Goal: Risk factors for ineffective tissue perfusion will decrease Outcome: Progressing Heparin and anticoagulant therapy of aggrenox/plavix continued. Pt verbalized may need change due to stroke. Neuro consult done.  Problem: Activity: Goal: Risk for activity intolerance will decrease Outcome: Progressing Pt completed PT consult with no needs; ambulated in hall independently: up in room and tolerated well. Encouraged pt to rest inbetween care.  Problem: Fluid Volume: Goal: Ability to maintain a balanced intake and output will improve Outcome: Progressing Taking po's well without difficulty. IVF's continued.  Problem: Nutrition: Goal: Adequate nutrition will be maintained Outcome: Progressing Eating well; good appetite. Pt does not agree with cardiac diet with education done; diet choices.  Problem: Bowel/Gastric: Goal: Will not experience complications related to bowel motility Outcome: Completed/Met Date Met:  12/06/15 Normal elimination - no issues

## 2015-12-06 NOTE — Care Management Obs Status (Signed)
[place] NOTIFICATION   Patient Details  Name: PAITYN PHIMMASONE MRN: [id number] Date of Birth: 1941/01/29   Medicare Observation Status Notification Given:  No (In observation status less than 24 hours.)    Ival Bible, RN 12/06/2015, 3:54 PM

## 2015-12-06 NOTE — Evaluation (Signed)
Physical Therapy Evaluation Patient Details Name: Michael Delacruz MRN: [id number] DOB: 12-08-1941 Today's Date: 12/06/2015   History of Present Illness  Pt had some R UE weakness that appears to have resolved, feeling nearly back to his normal.  Clinical Impression  Pt does well with PT session and shows no strength or coordination deficits in the R hand/UE.  He does report that it does not feel 100% normal, but that it is functioning well and that otherwise he feels completely fine and ready to go home.  Pt had no safety concerns with mobility and was able to ambulate at community appropriate speed.  No further needs.    Follow Up Recommendations No PT follow up    Equipment Recommendations       Recommendations for Other Services       Precautions / Restrictions Restrictions Weight Bearing Restrictions: No      Mobility  Bed Mobility Overal bed mobility: Independent                Transfers Overall transfer level: Independent                  Ambulation/Gait Ambulation/Gait assistance: Independent Ambulation Distance (Feet): 250 Feet Assistive device: None       General Gait Details: Pt ambulates with good speed and confidence, has no balance or safety concerns and reports being basically at his baseline.  Stairs Stairs: Yes Stairs assistance: Modified independent (Device/Increase time) Stair Management: One rail Right Number of Stairs: 4 General stair comments: Pt safe and confident on steps  Wheelchair Mobility    Modified Rankin (Stroke Patients Only)       Balance Overall balance assessment: Independent                                           Pertinent Vitals/Pain Pain Assessment: No/denies pain    Home Living Family/patient expects to be discharged to:: Private residence Living Arrangements: Spouse/significant other;Children Available Help at Discharge: Family   Home Access: Stairs to enter               Prior Function Level of Independence: Independent               Hand Dominance   Dominant Hand: Right    Extremity/Trunk Assessment   Upper Extremity Assessment: Overall WFL for tasks assessed (R side does not test weaker than L )           Lower Extremity Assessment: Overall WFL for tasks assessed         Communication   Communication: No difficulties  Cognition Arousal/Alertness: Awake/alert Behavior During Therapy: WFL for tasks assessed/performed Overall Cognitive Status: Within Functional Limits for tasks assessed                      General Comments      Exercises        Assessment/Plan    PT Assessment Patent does not need any further PT services  PT Diagnosis Generalized weakness   PT Problem List    PT Treatment Interventions     PT Goals (Current goals can be found in the Care Plan section) Acute Rehab PT Goals Patient Stated Goal: "Go home"    Frequency     Barriers to discharge        Co-evaluation  End of Session Equipment Utilized During Treatment: Gait belt Activity Tolerance: Patient tolerated treatment well Patient left: in bed Nurse Communication: Mobility status    Functional Assessment Tool Used: clinical judgement Functional Limitation: Mobility: Walking and moving around Mobility: Walking and Moving Around Current Status [phone]): 0 percent impaired, limited or restricted Mobility: Walking and Moving Around Goal Status [phone]): 0 percent impaired, limited or restricted Mobility: Walking and Moving Around Discharge Status [phone]): 0 percent impaired, limited or restricted    Time: 0822-0834 PT Time Calculation (min) (ACUTE ONLY): 12 min   Charges:   PT Evaluation $Initial PT Evaluation Tier I: 1 Procedure     PT G Codes:   PT G-Codes **NOT FOR INPATIENT CLASS** Functional Assessment Tool Used: clinical judgement Functional Limitation: Mobility: Walking and moving around Mobility:  Walking and Moving Around Current Status [phone]): 0 percent impaired, limited or restricted Mobility: Walking and Moving Around Goal Status [phone]): 0 percent impaired, limited or restricted Mobility: Walking and Moving Around Discharge Status [phone]): 0 percent impaired, limited or restricted   Wayne Both, PT, DPT [phone]  Kreg Shropshire 12/06/2015, 11:13 AM

## 2015-12-08 ENCOUNTER — Telehealth: Payer: Self-pay

## 2015-12-08 NOTE — Telephone Encounter (Signed)
Pt states he had a TIA over the weekend, and was in the hospital. Pt needs an appt for this week, but wants to talk with a nurse 1st. Also ok to call [phone], if primary # is not answer

## 2015-12-08 NOTE — Telephone Encounter (Signed)
Pt was advised by ED to f/u w/ cardiology w/in 5 days.  Any suggestions?

## 2015-12-08 NOTE — Telephone Encounter (Signed)
Can we call to see how he is doing, Any residual symptoms? How is his eye, vision?  Warfarin held previously for hemorrhage in the eye and vision changes

## 2015-12-09 NOTE — Telephone Encounter (Signed)
Update on [name] to the emergency room Friday, 12/05/2015, unable to move or use his right hand Symptoms lasted 15 minutes He was kept overnight in the hospital, seen by neurology, suggested he start eliquis   EKG was read wrong By emergency room and hospitalist, was told he was in atrial fibrillation but actually he is in normal sinus rhythm on both EKGs dated December 16.  Anticoagulation decision was based upon his arrhythmia, which he did not have  Currently still taking aspirin and Plavix Was told to change to eliquis on December 21  Question is, where do we go from here. Eye is better, back to his previous baseline. He is scheduled to see ophthalmology on Friday Currently taking aspirin and Plavix, I think my suggestion might be to go back on warfarin Let me know what you think? [place]

## 2015-12-09 NOTE — Telephone Encounter (Signed)
Spoke w/ pt.  Reports that on Friday night, he temporarily lost the use of his right hand, called his cousin in [place] who advised him that he was having a stroke and to proceed to ED. He reports that he went to [facility] ED and "nobody cared about me for a couple of hours". He was advised to stay on Plavix until 12/21 and then switch to Eliquis. He states that coumadin was advised several times to him, but he adamantly refuses to take this, as Dr. Roxy Manns "wants to save my right eye". Pt had hemorrhage behind eye, which is healing, and he does not want to jeopardize his vision any further. He does report some continued balance issues, but no other sx r/t TIA.  He would like for Dr. [name] to review his chart and see if he agreeable to the med changes that were made and if he thinks Eliquis was the right decision.

## 2015-12-10 MED ORDER — WARFARIN SODIUM 5 MG PO TABS: 5.0000 mg | ORAL_TABLET | Freq: Every day | ORAL | Status: DC

## 2015-12-10 NOTE — Telephone Encounter (Signed)
Would call patient and let him know that I have talked with Dr. Roxy Manns  Would start warfarin , restart at his previous dose  Need warfarin clinic next Wednesday or at least a check INR next Wednesday in one week   Would overlap warfarin with aspirin and Plavix  Stop aspirin and Plavix on Saturday

## 2015-12-10 NOTE — Telephone Encounter (Signed)
Spoke w/ pt and his wife.  Advised them of Dr. Donivan Scull recommendation.  Per Candice in [facility], restart pt back on coumadin 5 mg once daily.  Pt's wife verbalizes understanding and sched pt for coumadin check next week.

## 2015-12-17 ENCOUNTER — Ambulatory Visit (INDEPENDENT_AMBULATORY_CARE_PROVIDER_SITE_OTHER): Payer: Medicare HMO | Admitting: Pharmacist

## 2015-12-17 DIAGNOSIS — I4891 Unspecified atrial fibrillation: Secondary | ICD-10-CM | POA: Diagnosis not present

## 2015-12-17 DIAGNOSIS — Z5181 Encounter for therapeutic drug level monitoring: Secondary | ICD-10-CM

## 2015-12-17 DIAGNOSIS — Z8679 Personal history of other diseases of the circulatory system: Secondary | ICD-10-CM | POA: Diagnosis not present

## 2015-12-17 DIAGNOSIS — Z9889 Other specified postprocedural states: Secondary | ICD-10-CM | POA: Diagnosis not present

## 2015-12-17 DIAGNOSIS — Z7901 Long term (current) use of anticoagulants: Secondary | ICD-10-CM | POA: Diagnosis not present

## 2015-12-17 LAB — POCT INR: INR: 1.3

## 2015-12-20 ENCOUNTER — Telehealth: Payer: Self-pay | Admitting: Cardiothoracic Surgery

## 2015-12-24 ENCOUNTER — Telehealth: Payer: Self-pay | Admitting: Cardiovascular Disease

## 2015-12-24 NOTE — Telephone Encounter (Signed)
I have talked with [name] He has stopped warfarin, is taking 81 mg aspirin eye issues have resolved back to his baseline after using his drops  He does not want to retry warfarin He is willing to try eliquis He will start 2.5 mg twice a day  for 1-2 days And if no vision issues will go up 5 mill grams twice a day  He will call us if he develops any vision problems and will stop the medication

## 2015-12-31 ENCOUNTER — Ambulatory Visit (INDEPENDENT_AMBULATORY_CARE_PROVIDER_SITE_OTHER): Payer: Medicare HMO | Admitting: Cardiovascular Disease

## 2015-12-31 ENCOUNTER — Encounter: Payer: Self-pay | Admitting: Cardiovascular Disease

## 2015-12-31 VITALS — BP 114/60 | HR 84 | Ht 73.0 in | Wt 199.5 lb

## 2015-12-31 DIAGNOSIS — H547 Unspecified visual loss: Secondary | ICD-10-CM | POA: Insufficient documentation

## 2015-12-31 DIAGNOSIS — I480 Paroxysmal atrial fibrillation: Secondary | ICD-10-CM

## 2015-12-31 DIAGNOSIS — J449 Chronic obstructive pulmonary disease, unspecified: Secondary | ICD-10-CM

## 2015-12-31 DIAGNOSIS — F17200 Nicotine dependence, unspecified, uncomplicated: Secondary | ICD-10-CM

## 2015-12-31 DIAGNOSIS — I341 Nonrheumatic mitral (valve) prolapse: Secondary | ICD-10-CM

## 2015-12-31 DIAGNOSIS — I209 Angina pectoris, unspecified: Secondary | ICD-10-CM

## 2015-12-31 DIAGNOSIS — R079 Chest pain, unspecified: Secondary | ICD-10-CM

## 2015-12-31 DIAGNOSIS — Z720 Tobacco use: Secondary | ICD-10-CM

## 2015-12-31 DIAGNOSIS — I5032 Chronic diastolic (congestive) heart failure: Secondary | ICD-10-CM | POA: Diagnosis not present

## 2015-12-31 DIAGNOSIS — Z9889 Other specified postprocedural states: Secondary | ICD-10-CM

## 2015-12-31 NOTE — Assessment & Plan Note (Signed)
He has atypical chest pain following the surgery, rare events lasting several seconds at a time. most likely musculoskeletal issues.he is not concerned

## 2015-12-31 NOTE — Assessment & Plan Note (Signed)
Encouraged him to try eliquis  5 mg twice a day if he feels I symptoms are stable  So far has tolerated 2.5 mill grams twice a day and has tolerated 5 mg daily ( the later  Dosing he did on his own).  He is scheduled to see Dr. Roxy Manns in one month.  He is hoping to come off anticoagulations at that time

## 2015-12-31 NOTE — Assessment & Plan Note (Signed)
He has smoked for 60 years, continues to smoke though trying to quit  Chronic baseline shortness of breath on exertion,  mild

## 2015-12-31 NOTE — Patient Instructions (Signed)
You are doing well. No medication changes were made.  Please call us if you have new issues that need to be addressed before your next appt.  Your physician wants you to follow-up in: 3 months You will receive a reminder letter in the mail two months in advance. If you don't receive a letter, please call our office to schedule the follow-up appointment.   

## 2015-12-31 NOTE — Assessment & Plan Note (Signed)
He has done well since his mitral valve repair Follow-up echocardiogram showing valve is intact, minimal MR

## 2015-12-31 NOTE — Assessment & Plan Note (Signed)
Appears relatively euvolemic,  He does not want medications at this time  Recommended he call us if he develops leg edema, shortness of breath on exertion

## 2015-12-31 NOTE — Progress Notes (Signed)
Patient ID: Michael Delacruz, male    DOB: Jun 01, 1941, 75 y.o.   MRN: [id number]  HPI Comments: Michael Delacruz is a 75 year old gentleman with a reported history of rheumatic fever as a child in his late teenage years, a murmur for several decades, long smoking history for 50 years with underlying COPD, with worsening shortness of breath when he was first seen in clinic, severe mitral valve regurgitation on echocardiogram, prolapse of posterior leaflet, moderate pulmonary hypertension on initial echocardiogram who presents for Routine followup of his severe MR, s/p successful MR repair by right thoracotomy by Dr. Roxy Delacruz one month ago, November 2016, no significant postoperative complications   he had bleeding in his right eye  On warfarin, INR was greater than 5 Seen by ophthalmology, symptoms  Improved back to his baseline  started on aspirin and Plavix, had neurologic event with weakness in his hand  Was told in the emergency room he was in atrial fibrillation ( review EKG actually showed normal sinus rhythm)  Warfarin was restarted for TIA symptoms  Had recurrent  High bleedingsymptoms when he restarted warfarin   Long discussion with  him over the phone, he started eliquis  For stroke prevention  On his visit today, he reports taking eliquis  2.5 mill grams twice a day for 2 days then went up to 5 mg daily for the past week  Over the phone we had suggested go to 5 mg twice a day   For the most part vision has been stable though does report a "grayness"  In the right eye today , still able to see relatively well  Energy is improving , exercising more  Currently not taking lisinopril or beta blocker  denies any tachycardia concerning for arrhythmia  overall his wife reports he is doing well  EKG on today's visit shows no sinus rhythm with rate 84 bpm, diffuse  ST and T wave abnormality V5 through V6, 1 and aVL  Other past medical history Hospital[facility]t [facility] for paroxysmal atrial  fibrillation. After rate control, he had TEE cardioversion and was discharged on diltiazem, and coagulation.  Previously had bradycardia on metoprolol was taking 12.5 mg in the morning  previousdramatic improvement in his weight and diet with drop of his cholesterol from 240 to 170. Cholesterol now back up to 260. He refused cholesterol pill  Repeat echocardiogram 2013 shows mildly dilated left ventricle at end systole, left ventricular size is less than 4 cm in diastole,   normal LV function estimated at >60%, moderate valve prolapse of the posterior leaflet with moderate to severe mitral valve regurgitation that is the centric and directed towards the septum, right ventricular systolic pressure estimated at 50 mm mercury  He had a cardiac catheterization  that showed severe mitral valve regurgitation, 30% mid and 30% proximal LAD disease, 50% mid circumflex disease ejection fraction 50%.   Allergies  Allergen Reactions  . Codeine Nausea Only  . Macrodantin [Nitrofurantoin Macrocrystal] Rash  . Morphine And Related Rash    Outpatient Encounter Prescriptions as of 12/31/2015  Medication Sig  . ALPRAZolam (XANAX) 0.5 MG tablet Take 1 tablet (0.5 mg total) by mouth 3 (three) times daily as needed for anxiety or sleep.  [name] apixaban (ELIQUIS) 5 MG TABS tablet Take 5 mg by mouth daily.  . [DISCONTINUED] atorvastatin (LIPITOR) 40 MG tablet Take 1 tablet (40 mg total) by mouth daily at 6 PM. (Patient not taking: Reported on 12/31/2015)  . [DISCONTINUED] nicotine (NICODERM CQ -  DOSED IN MG/24 HOURS) 21 mg/24hr patch Place 1 patch (21 mg total) onto the skin daily. (Patient not taking: Reported on 12/31/2015)  . [DISCONTINUED] traMADol (ULTRAM) 50 MG tablet Take 1-2 tablets (50-100 mg total) by mouth every 6 (six) hours as needed for moderate pain. (Patient not taking: Reported on 12/31/2015)  . [DISCONTINUED] warfarin (COUMADIN) 5 MG tablet Take 1 tablet (5 mg total) by mouth daily. As directed by the  coumadin clinic. (Patient not taking: Reported on 12/31/2015)   No facility-administered encounter medications on file as of 12/31/2015.    Past Medical History  Diagnosis Date  . COPD (chronic obstructive pulmonary disease) ([place])   . History of blood clots     eye   . Dilation of intestine 01/2015  . Asthma   . Severe mitral regurgitation   . Atrial fibrillation, persistent ([place]) 07/2015  . History of rheumatic fever   . Tobacco abuse   . Chronic diastolic congestive heart failure ([place])   . Anginal pain ([place])   . Hypertension   . Dysrhythmia   . Heart murmur   . Stroke ([place])   . Kidney stone   . History of hiatal hernia   . GERD (gastroesophageal reflux disease)   . Headache   . Arthritis   . Fibromyalgia   . S/P minimally invasive mitral valve repair 10/22/2015    Complex valvuloplasty including triangular resection of posterior leaflet, artificial Gore-tex neochord placement x6 and 38 mm Sorin Memo 3D Rechord ring annuloplasty via right minithoracotomy approach  . S/P Minimally invasive maze operation for atrial fibrillation 10/22/2015    Complete bilateral atrial lesion set using cryothermy and bipolar radiofrequency ablation with clipping of LA appendage via right mini thoracotomy approach    Past Surgical History  Procedure Laterality Date  . Ankle surgery    . Tee without cardioversion N/A 08/18/2015    Procedure: TRANSESOPHAGEAL ECHOCARDIOGRAM (TEE);  Surgeon: Wellington Hampshire, MD;  Location: [facility]S;  Service: Cardiovascular;  Laterality: N/A;  . Electrophysiologic study N/A 08/18/2015    Procedure: CARDIOVERSION;  Surgeon: Wellington Hampshire, MD;  Location: [facility];  Service: Cardiovascular;  Laterality: N/A;  . Cardiac catheterization N/A 10/03/2015    Procedure: Right and Left Heart Cath and Coronary Angiography;  Surgeon: [name], MD;  Location: [place] [facility];  Service: Cardiovascular;  Laterality: N/A;  . Colonoscopy    . Sinus exploration    .  Mitral valve repair Right 10/22/2015    Procedure: MINIMALLY INVASIVE MITRAL VALVE REPAIR (MVR);  Surgeon: Rexene Alberts, MD;  Location: [place];  Service: Open Heart Surgery;  Laterality: Right;  . Minimally invasive maze procedure N/A 10/22/2015    Procedure: MINIMALLY INVASIVE MAZE PROCEDURE;  Surgeon: Rexene Alberts, MD;  Location: [place];  Service: Open Heart Surgery;  Laterality: N/A;  . Tee without cardioversion N/A 10/22/2015    Procedure: TRANSESOPHAGEAL ECHOCARDIOGRAM (TEE);  Surgeon: Rexene Alberts, MD;  Location: [place];  Service: Open Heart Surgery;  Laterality: N/A;    Social History  reports that he has been smoking Cigarettes.  He has a 26 pack-year smoking history. He does not have any smokeless tobacco history on file. He reports that he does not drink alcohol or use illicit drugs.  Family History family history includes Hypertension in his other.  Review of Systems  Constitutional: Negative.   Eyes: Positive for visual disturbance.  Cardiovascular: Negative.   Gastrointestinal: Negative.   Musculoskeletal: Negative.   Neurological:  Negative.   Hematological: Negative.   All other systems reviewed and are negative.   BP 114/60 mmHg  Pulse 84  Ht 6\' 1"  (1.854 m)  Wt 199 lb 8 oz (90.493 kg)  BMI 26.33 kg/m2  Physical Exam  Constitutional: He is oriented to person, place, and time. He appears well-developed and well-nourished.  HENT:  Head: Normocephalic.  Nose: Nose normal.  Mouth/Throat: Oropharynx is clear and moist.  Eyes: Conjunctivae are normal. Pupils are equal, round, and reactive to light.  Neck: Normal range of motion. Neck supple. No JVD present.  Cardiovascular: Normal rate, regular rhythm, S1 normal, S2 normal and intact distal pulses.  Exam reveals no gallop and no friction rub.   Pulmonary/Chest: Effort normal and breath sounds normal. No respiratory distress. He has no wheezes. He has no rales. He exhibits no tenderness.  Abdominal: Soft. Bowel  sounds are normal. He exhibits no distension. There is no tenderness.  Musculoskeletal: Normal range of motion. He exhibits no edema or tenderness.  Lymphadenopathy:    He has no cervical adenopathy.  Neurological: He is alert and oriented to person, place, and time. Coordination normal.  Skin: Skin is warm and dry. No rash noted. No erythema.  Psychiatric: He has a normal mood and affect. His behavior is normal. Judgment and thought content normal.      Assessment and Plan   Nursing note and vitals reviewed.

## 2015-12-31 NOTE — Assessment & Plan Note (Signed)
eye bleed in the setting of supratherapeutic INR,  Seems to be tolerating eliquis

## 2015-12-31 NOTE — Assessment & Plan Note (Signed)
We have encouraged him to continue to work on weaning his cigarettes and smoking cessation. He will continue to work on this and does not want any assistance with chantix.  

## 2016-01-01 ENCOUNTER — Encounter (HOSPITAL_COMMUNITY): Payer: Self-pay | Admitting: Emergency Medicine

## 2016-01-01 ENCOUNTER — Telehealth: Payer: Self-pay

## 2016-01-01 ENCOUNTER — Emergency Department (HOSPITAL_COMMUNITY): Payer: Medicare HMO

## 2016-01-01 ENCOUNTER — Emergency Department (HOSPITAL_COMMUNITY)
Admission: EM | Admit: 2016-01-01 | Discharge: 2016-01-01 | Disposition: A | Discharge status: Home / Self Care | Payer: Medicare HMO | Attending: Emergency Medicine | Admitting: Emergency Medicine

## 2016-01-01 DIAGNOSIS — Z8719 Personal history of other diseases of the digestive system: Secondary | ICD-10-CM | POA: Diagnosis not present

## 2016-01-01 DIAGNOSIS — I10 Essential (primary) hypertension: Secondary | ICD-10-CM | POA: Diagnosis not present

## 2016-01-01 DIAGNOSIS — Z8739 Personal history of other diseases of the musculoskeletal system and connective tissue: Secondary | ICD-10-CM | POA: Diagnosis not present

## 2016-01-01 DIAGNOSIS — Z8673 Personal history of transient ischemic attack (TIA), and cerebral infarction without residual deficits: Secondary | ICD-10-CM | POA: Diagnosis not present

## 2016-01-01 DIAGNOSIS — R079 Chest pain, unspecified: Secondary | ICD-10-CM | POA: Diagnosis not present

## 2016-01-01 DIAGNOSIS — Z9889 Other specified postprocedural states: Secondary | ICD-10-CM | POA: Diagnosis not present

## 2016-01-01 DIAGNOSIS — F17210 Nicotine dependence, cigarettes, uncomplicated: Secondary | ICD-10-CM | POA: Insufficient documentation

## 2016-01-01 DIAGNOSIS — I5022 Chronic systolic (congestive) heart failure: Secondary | ICD-10-CM | POA: Diagnosis not present

## 2016-01-01 DIAGNOSIS — Z7902 Long term (current) use of antithrombotics/antiplatelets: Secondary | ICD-10-CM | POA: Diagnosis not present

## 2016-01-01 DIAGNOSIS — J449 Chronic obstructive pulmonary disease, unspecified: Secondary | ICD-10-CM | POA: Diagnosis not present

## 2016-01-01 DIAGNOSIS — Z87442 Personal history of urinary calculi: Secondary | ICD-10-CM | POA: Diagnosis not present

## 2016-01-01 DIAGNOSIS — Z79899 Other long term (current) drug therapy: Secondary | ICD-10-CM | POA: Diagnosis not present

## 2016-01-01 DIAGNOSIS — Z8619 Personal history of other infectious and parasitic diseases: Secondary | ICD-10-CM | POA: Diagnosis not present

## 2016-01-01 DIAGNOSIS — R011 Cardiac murmur, unspecified: Secondary | ICD-10-CM | POA: Diagnosis not present

## 2016-01-01 LAB — CBC
HCT: 41.1 % (ref 39.0–52.0)
Hemoglobin: 13.3 g/dL (ref 13.0–17.0)
MCH: 28.9 pg (ref 26.0–34.0)
MCHC: 32.4 g/dL (ref 30.0–36.0)
MCV: 89.3 fL (ref 78.0–100.0)
Platelets: 169 10*3/uL (ref 150–400)
RBC: 4.6 MIL/uL (ref 4.22–5.81)
RDW: 14.9 % (ref 11.5–15.5)
WBC: 6.9 10*3/uL (ref 4.0–10.5)

## 2016-01-01 LAB — BASIC METABOLIC PANEL
Anion gap: 10 (ref 5–15)
BUN: <5 mg/dL — ABNORMAL LOW (ref 6–20)
CO2: 26 mmol/L (ref 22–32)
Calcium: 9.2 mg/dL (ref 8.9–10.3)
Chloride: 106 mmol/L (ref 101–111)
Creatinine, Ser: 1.03 mg/dL (ref 0.61–1.24)
GFR calc Af Amer: >60 mL/min (ref >60)
GFR calc non Af Amer: >60 mL/min (ref >60)
Glucose, Bld: 114 mg/dL — ABNORMAL HIGH (ref 65–99)
Potassium: 3.9 mmol/L (ref 3.5–5.1)
Sodium: 142 mmol/L (ref 135–145)

## 2016-01-01 LAB — I-STAT TROPONIN, ED
Troponin i, poc: 0.01 ng/mL (ref 0.00–0.08)
Troponin i, poc: 0.01 ng/mL (ref 0.00–0.08)

## 2016-01-01 LAB — BRAIN NATRIURETIC PEPTIDE: B Natriuretic Peptide: 71.5 pg/mL (ref 0.0–100.0)

## 2016-01-01 NOTE — ED Notes (Signed)
Pt states while sitting on the toilet at 1130 he started having sharp left sided chest pain into left neck arm and into his back . Pt states pain lasted for 30 minutes and went away. Pt states now he has ache in his breast bone and left shoulder. Pt states he did become sob but denies any diaphoresis n/v.

## 2016-01-01 NOTE — ED Notes (Signed)
MD at bedside. 

## 2016-01-01 NOTE — Telephone Encounter (Signed)
Spoke w/ pt.  He states that he is calling to let Dr. [name] know that he is going to [facility] ED. He is having considerable chest pain that is radiating to his left arm.  He would like for Dr. [name] to call Dr. Roxy Manns, as "he knows my history and give them a heads up". He states that his wife will be driving him and they are leaving in about 10 minutes.

## 2016-01-01 NOTE — Telephone Encounter (Signed)
Pt states he is having "serious CP", states he is having"jolts" that really hurts. Please call. Pain down in to his left arm.

## 2016-01-01 NOTE — Discharge Instructions (Signed)
Nonspecific Chest Pain  °Chest pain can be caused by many different conditions. There is always a chance that your pain could be related to something serious, such as a heart attack or a blood clot in your lungs. Chest pain can also be caused by conditions that are not life-threatening. If you have chest pain, it is very important to follow up with your health care provider. °CAUSES  °Chest pain can be caused by: °· Heartburn. °· Pneumonia or bronchitis. °· Anxiety or stress. °· Inflammation around your heart (pericarditis) or lung (pleuritis or pleurisy). °· A blood clot in your lung. °· A collapsed lung (pneumothorax). It can develop suddenly on its own (spontaneous pneumothorax) or from trauma to the chest. °· Shingles infection (varicella-zoster virus). °· Heart attack. °· Damage to the bones, muscles, and cartilage that make up your chest wall. This can include: °¨ Bruised bones due to injury. °¨ Strained muscles or cartilage due to frequent or repeated coughing or overwork. °¨ Fracture to one or more ribs. °¨ Sore cartilage due to inflammation (costochondritis). °RISK FACTORS  °Risk factors for chest pain may include: °· Activities that increase your risk for trauma or injury to your chest. °· Respiratory infections or conditions that cause frequent coughing. °· Medical conditions or overeating that can cause heartburn. °· Heart disease or family history of heart disease. °· Conditions or health behaviors that increase your risk of developing a blood clot. °· Having had chicken pox (varicella zoster). °SIGNS AND SYMPTOMS °Chest pain can feel like: °· Burning or tingling on the surface of your chest or deep in your chest. °· Crushing, pressure, aching, or squeezing pain. °· Dull or sharp pain that is worse when you move, cough, or take a deep breath. °· Pain that is also felt in your back, neck, shoulder, or arm, or pain that spreads to any of these areas. °Your chest pain may come and go, or it may stay  constant. °DIAGNOSIS °Lab tests or other studies may be needed to find the cause of your pain. Your health care provider may have you take a test called an ambulatory ECG (electrocardiogram). An ECG records your heartbeat patterns at the time the test is performed. You may also have other tests, such as: °· Transthoracic echocardiogram (TTE). During echocardiography, sound waves are used to create a picture of all of the heart structures and to look at how blood flows through your heart. °· Transesophageal echocardiogram (TEE). This is a more advanced imaging test that obtains images from inside your body. It allows your health care provider to see your heart in finer detail. °· Cardiac monitoring. This allows your health care provider to monitor your heart rate and rhythm in real time. °· Holter monitor. This is a portable device that records your heartbeat and can help to diagnose abnormal heartbeats. It allows your health care provider to track your heart activity for several days, if needed. °· Stress tests. These can be done through exercise or by taking medicine that makes your heart beat more quickly. °· Blood tests. °· Imaging tests. °TREATMENT  °Your treatment depends on what is causing your chest pain. Treatment may include: °· Medicines. These may include: °¨ Acid blockers for heartburn. °¨ Anti-inflammatory medicine. °¨ Pain medicine for inflammatory conditions. °¨ Antibiotic medicine, if an infection is present. °¨ Medicines to dissolve blood clots. °¨ Medicines to treat coronary artery disease. °· Supportive care for conditions that do not require medicines. This may include: °¨ Resting. °¨ Applying heat   or cold packs to injured areas. °¨ Limiting activities until pain decreases. °HOME CARE INSTRUCTIONS °· If you were prescribed an antibiotic medicine, finish it all even if you start to feel better. °· Avoid any activities that bring on chest pain. °· Do not use any tobacco products, including  cigarettes, chewing tobacco, or electronic cigarettes. If you need help quitting, ask your health care provider. °· Do not drink alcohol. °· Take medicines only as directed by your health care provider. °· Keep all follow-up visits as directed by your health care provider. This is important. This includes any further testing if your chest pain does not go away. °· If heartburn is the cause for your chest pain, you may be told to keep your head raised (elevated) while sleeping. This reduces the chance that acid will go from your stomach into your esophagus. °· Make lifestyle changes as directed by your health care provider. These may include: °¨ Getting regular exercise. Ask your health care provider to suggest some activities that are safe for you. °¨ Eating a heart-healthy diet. A registered dietitian can help you to learn healthy eating options. °¨ Maintaining a healthy weight. °¨ Managing diabetes, if necessary. °¨ Reducing stress. °SEEK MEDICAL CARE IF: °· Your chest pain does not go away after treatment. °· You have a rash with blisters on your chest. °· You have a fever. °SEEK IMMEDIATE MEDICAL CARE IF:  °· Your chest pain is worse. °· You have an increasing cough, or you cough up blood. °· You have severe abdominal pain. °· You have severe weakness. °· You faint. °· You have chills. °· You have sudden, unexplained chest discomfort. °· You have sudden, unexplained discomfort in your arms, back, neck, or jaw. °· You have shortness of breath at any time. °· You suddenly start to sweat, or your skin gets clammy. °· You feel nauseous or you vomit. °· You suddenly feel light-headed or dizzy. °· Your heart begins to beat quickly, or it feels like it is skipping beats. °These symptoms may represent a serious problem that is an emergency. Do not wait to see if the symptoms will go away. Get medical help right away. Call your local emergency services (911 in the [place] Do not drive yourself to the hospital. °  °This  information is not intended to replace advice given to you by your health care provider. Make sure you discuss any questions you have with your health care provider. °  °Document Released: 09/15/2005 Document Revised: 12/27/2014 Document Reviewed: 07/12/2014 °[employer]vier Interactive Patient Education ©2016 [employer] ° °

## 2016-01-01 NOTE — ED Provider Notes (Signed)
CSN: [id number]     Arrival date & time 01/01/16  1304 History   First MD Initiated Contact with Patient 01/01/16 1934     Chief Complaint  Patient presents with  . Chest Pain     (Consider location/radiation/quality/duration/timing/severity/associated sxs/prior Treatment) Patient is a 75 y.o. male presenting with chest pain. The history is provided by the patient and the spouse.  Chest Pain Pain location:  L chest Pain quality: sharp   Pain radiates to:  L jaw and L arm Pain radiates to the back: no   Pain severity:  Moderate Onset quality:  Gradual Duration:  2 hours Timing:  Constant Progression:  Resolved Chronicity:  New Context: at rest   Context: no trauma   Context comment:  Pt did shovel snow the day before Relieved by:  Rest Worsened by:  Nothing tried Ineffective treatments:  None tried Associated symptoms: no abdominal pain, no AICD problem, no altered mental status, no anorexia, no anxiety, no back pain, no claudication, no cough, no diaphoresis, no dizziness, no dysphagia, no fatigue, no fever, no headache, no heartburn, no lower extremity edema, no nausea, no near-syncope, no numbness, no orthopnea, no palpitations, no shortness of breath, no syncope, not vomiting and no weakness   Risk factors: male sex   Risk factors comment:  Recent mitral valve repair   Past Medical History  Diagnosis Date  . COPD (chronic obstructive pulmonary disease) ([place])   . History of blood clots     eye   . Dilation of intestine 01/2015  . Asthma   . Severe mitral regurgitation   . Atrial fibrillation, persistent ([place]) 07/2015  . History of rheumatic fever   . Tobacco abuse   . Chronic diastolic congestive heart failure ([place])   . Anginal pain ([place])   . Hypertension   . Dysrhythmia   . Heart murmur   . Stroke ([place])   . Kidney stone   . History of hiatal hernia   . GERD (gastroesophageal reflux disease)   . Headache   . Arthritis   . Fibromyalgia   . S/P minimally invasive  mitral valve repair 10/22/2015    Complex valvuloplasty including triangular resection of posterior leaflet, artificial Gore-tex neochord placement x6 and 38 mm Sorin Memo 3D Rechord ring annuloplasty via right minithoracotomy approach  . S/P Minimally invasive maze operation for atrial fibrillation 10/22/2015    Complete bilateral atrial lesion set using cryothermy and bipolar radiofrequency ablation with clipping of LA appendage via right mini thoracotomy approach   Past Surgical History  Procedure Laterality Date  . Ankle surgery    . Tee without cardioversion N/A 08/18/2015    Procedure: TRANSESOPHAGEAL ECHOCARDIOGRAM (TEE);  Surgeon: Wellington Hampshire, MD;  Location: [facility];  Service: Cardiovascular;  Laterality: N/A;  . Electrophysiologic study N/A 08/18/2015    Procedure: CARDIOVERSION;  Surgeon: Wellington Hampshire, MD;  Location: [facility];  Service: Cardiovascular;  Laterality: N/A;  . Cardiac catheterization N/A 10/03/2015    Procedure: Right and Left Heart Cath and Coronary Angiography;  Surgeon: [name], MD;  Location: [place] [facility];  Service: Cardiovascular;  Laterality: N/A;  . Colonoscopy    . Sinus exploration    . Mitral valve repair Right 10/22/2015    Procedure: MINIMALLY INVASIVE MITRAL VALVE REPAIR (MVR);  Surgeon: Rexene Alberts, MD;  Location: [place];  Service: Open Heart Surgery;  Laterality: Right;  . Minimally invasive maze procedure N/A 10/22/2015    Procedure: MINIMALLY INVASIVE MAZE  PROCEDURE;  Surgeon: Rexene Alberts, MD;  Location: [place];  Service: Open Heart Surgery;  Laterality: N/A;  . Tee without cardioversion N/A 10/22/2015    Procedure: TRANSESOPHAGEAL ECHOCARDIOGRAM (TEE);  Surgeon: Rexene Alberts, MD;  Location: [place];  Service: Open Heart Surgery;  Laterality: N/A;   Family History  Problem Relation Age of Onset  . Hypertension Other    Social History  Substance Use Topics  . Smoking status: Current Every Day Smoker -- 0.50 packs/day for  52 years    Types: Cigarettes  . Smokeless tobacco: None  . Alcohol Use: No     Comment: rare    Review of Systems  Constitutional: Negative for fever, diaphoresis and fatigue.  HENT: Negative for trouble swallowing.   Eyes: Negative for pain and visual disturbance.  Respiratory: Negative for cough and shortness of breath.   Cardiovascular: Positive for chest pain. Negative for palpitations, orthopnea, claudication, syncope and near-syncope.  Gastrointestinal: Negative for heartburn, nausea, vomiting, abdominal pain and anorexia.  Genitourinary: Negative for dysuria.  Musculoskeletal: Negative for back pain.  Skin: Negative for rash.  Neurological: Negative for dizziness, weakness, numbness and headaches.  Psychiatric/Behavioral: Negative for confusion.      Allergies  Codeine; Macrodantin; and Morphine and related  Home Medications   Prior to Admission medications   Medication Sig Start Date End Date Taking? Authorizing Provider  ALPRAZolam Duanne Moron) 0.5 MG tablet Take 1 tablet (0.5 mg total) by mouth 3 (three) times daily as needed for anxiety or sleep. Patient taking differently: Take 0.25 mg by mouth 2 (two) times daily.  12/06/15  Yes Nicholes Mango, MD  apixaban (ELIQUIS) 5 MG TABS tablet Take 5 mg by mouth daily.   Yes Historical Provider, MD   BP 138/86 mmHg  Pulse 79  Temp(Src) 98.4 F (36.9 C) (Oral)  Resp 22  SpO2 99% Physical Exam  Constitutional: He is oriented to person, place, and time. He appears well-developed and well-nourished. No distress.  HENT:  Head: Normocephalic and atraumatic.  Eyes: Conjunctivae and EOM are normal. Pupils are equal, round, and reactive to light.  Neck: Normal range of motion.  Cardiovascular: Normal rate and regular rhythm.  Exam reveals no gallop and no friction rub.   No murmur heard. Pulmonary/Chest: Effort normal and breath sounds normal. No respiratory distress. He has no wheezes. He has no rales. He exhibits no tenderness.   Abdominal: Soft. Bowel sounds are normal. He exhibits no distension and no mass. There is no tenderness. There is no rebound and no guarding.  Musculoskeletal: Normal range of motion.  Neurological: He is alert and oriented to person, place, and time. No cranial nerve deficit.  Skin: Skin is warm and dry. No rash noted. He is not diaphoretic. No erythema.  Psychiatric: His behavior is normal.    ED Course  Procedures (including critical care time) Labs Review Labs Reviewed  BASIC METABOLIC PANEL - Abnormal; Notable for the following:    Glucose, Bld 114 (*)    BUN <5 (*)    All other components within normal limits  CBC  BRAIN NATRIURETIC PEPTIDE  I-STAT TROPOININ, ED  I-STAT TROPOININ, ED  Randolm Idol, ED    Imaging Review Dg Chest 2 View  01/01/2016  CLINICAL DATA:  Chest pain today. History of COPD and mitral valve surgery. EXAM: CHEST  2 VIEW COMPARISON:  Radiographs 11/10/2015 and 10/29/2015. FINDINGS: The heart size and mediastinal contours are stable status post left atrial appendage clamping and mitral valve replacement.  There is mild aortic atherosclerosis. There is interval improved aeration of the lung bases with no residual pleural effusion. There is mild chronic lung disease with basilar scarring and chronic vascular congestion. No pneumothorax. The bones appear unchanged with a lower thoracic compression deformity. IMPRESSION: Interval improved aeration of the lung bases. No acute cardiopulmonary process. Electronically Signed   By: Richardean Sale M.D.   On: 01/01/2016 13:57   I have personally reviewed and evaluated these images and lab results as part of my medical decision-making.   EKG Interpretation   Date/Time:  Thursday January 01 2016 13:08:14 EST Ventricular Rate:  74 PR Interval:    QRS Duration: 92 QT Interval:  414 QTC Calculation: 459 R Axis:   46 Text Interpretation:  Atrial fibrillation with a competing junctional  pacemaker Nonspecific ST  and T wave abnormality Abnormal ECG No  significant change was found Confirmed by Wyvonnia Dusky  MD, STEPHEN [phone]) on  01/01/2016 7:48:37 PM      MDM   Final diagnoses:  Chest pain, unspecified chest pain type    75 year old white male with history of mitral valve repair in November on the coagulation presents in setting of left-sided chest pain. Patient reports approximately 11:30 today he experienced left chest pain which felt like a "punching sensation". Patient reports it radiated to left arm and left neck. He reports pain lasted approximately one to one and a half hours. During this time patient called his cardiologist office who advised him to come to emergency department for further evaluation. Patient reports pain resolved prior to arrival in the emergency department.  On arrival patient hemodynamically stable and afebrile. EKG obtained which revealed no significant abnormality when compared with previous. No signs of acute ischemia at this time. Patient had initial troponin obtained which was not significantly elevated. Patient waited in the waiting room for possibly 7 hours prior to being seen. Patient continued to be pain-free during that time. Laboratory analysis obtained without significant electrolyte abnormalities, anemia, creatinine elevation. Repeat troponin continued to not be elevated. Chest x-ray revealed no acute cardiopulmonary abnormality. Patient did report he was shoveling snow the previous day before pain started. Denies shortness of breath  Patient reported mild shortness of breath during event.  This patient was advised to come in by cardiology cardiologist Dr. Lovena Le evaluate patient. Differential included ACS, muscular skeletal pain, PE, aortic dissection. Cardiology in agreement with my assessment this is likely musculoskeletal pain. Patient will be discharged home with strict return precautions and plan to follow up with Dr. [name] for further management of symptoms.  Patient stable at time of discharge.  Attending has seen and evaluated patient and Dr. Wyvonnia Dusky is in agreement with plan.   Esaw Grandchild, MD 01/02/16 [id number]  Ezequiel Essex, MD 01/02/16 [phone]

## 2016-01-01 NOTE — Telephone Encounter (Signed)
Pt's wife called, stating that pt's pain has subsided after being constant for 45 mins and he no longer wants to go to ED. Advised her to proceed so that he can be assessed.  She is agreeable and will continue to ED.

## 2016-01-01 NOTE — Consult Note (Signed)
Reason for Consult: chest pain Primary cardiologist: Dr. [name] Referring Physician: Dr. Elwyn Michael Delacruz is an 75 y.o. male.  HPI: Michael Delacruz is a 75 yo man with PMH of rheumatic fever, fibromyalgia, COPD and severe MR s/p MR repair via right thoracotomy by Dr. Roxy Delacruz November 2016 who came to the ER shortly after lunch time at urging of Michael Delacruz office for chest pain. He says he had sudden development of chest pain in left upper chest with involvement of arm and shoulder. It was more sharp, not associated with exertion and not pressure sensation. However, it did take his breath away. He walked around a bit without improvement, it lasted upwards of 45 minutes and ultimately resolved with Doterra Lotion and massage from his wife. On recollection, he shovelled snow yesterday morning. He's had no exercise intolerance walking at the track or shovelling snow or doing one of many activities. No weight gain/weight loss. No issues with medications. He actually saw Dr. [name] yesterday. He unfortunately waited in the ER for 7-8 hours but this allowed two separate troponins to be drawn which were both negative. His ECG was also stable from ECG in December and previous. On further discussion, he felt comfortable going home and understood ER precautions. He said the pain was actually similar to other locations as pain he has had with his fibromyalgia.     He h   Past Medical History  Diagnosis Date  . COPD (chronic obstructive pulmonary disease) ([place])   . History of blood clots     eye   . Dilation of intestine 01/2015  . Asthma   . Severe mitral regurgitation   . Atrial fibrillation, persistent ([place]) 07/2015  . History of rheumatic fever   . Tobacco abuse   . Chronic diastolic congestive heart failure ([place])   . Anginal pain ([place])   . Hypertension   . Dysrhythmia   . Heart murmur   . Stroke ([place])   . Kidney stone   . History of hiatal hernia   . GERD (gastroesophageal reflux disease)     . Headache   . Arthritis   . Fibromyalgia   . S/P minimally invasive mitral valve repair 10/22/2015    Complex valvuloplasty including triangular resection of posterior leaflet, artificial Gore-tex neochord placement x6 and 38 mm Sorin Memo 3D Rechord ring annuloplasty via right minithoracotomy approach  . S/P Minimally invasive maze operation for atrial fibrillation 10/22/2015    Complete bilateral atrial lesion set using cryothermy and bipolar radiofrequency ablation with clipping of LA appendage via right mini thoracotomy approach    Past Surgical History  Procedure Laterality Date  . Ankle surgery    . Tee without cardioversion N/A 08/18/2015    Procedure: TRANSESOPHAGEAL ECHOCARDIOGRAM (TEE);  Surgeon: Wellington Hampshire, MD;  Location: [facility];  Service: Cardiovascular;  Laterality: N/A;  . Electrophysiologic study N/A 08/18/2015    Procedure: CARDIOVERSION;  Surgeon: Wellington Hampshire, MD;  Location: [facility];  Service: Cardiovascular;  Laterality: N/A;  . Cardiac catheterization N/A 10/03/2015    Procedure: Right and Left Heart Cath and Coronary Angiography;  Surgeon: [name], MD;  Location: [place] [facility];  Service: Cardiovascular;  Laterality: N/A;  . Colonoscopy    . Sinus exploration    . Mitral valve repair Right 10/22/2015    Procedure: MINIMALLY INVASIVE MITRAL VALVE REPAIR (MVR);  Surgeon: Rexene Alberts, MD;  Location: [place];  Service: Open Heart Surgery;  Laterality: Right;  .  Minimally invasive maze procedure N/A 10/22/2015    Procedure: MINIMALLY INVASIVE MAZE PROCEDURE;  Surgeon: Rexene Alberts, MD;  Location: [place];  Service: Open Heart Surgery;  Laterality: N/A;  . Tee without cardioversion N/A 10/22/2015    Procedure: TRANSESOPHAGEAL ECHOCARDIOGRAM (TEE);  Surgeon: Rexene Alberts, MD;  Location: [place];  Service: Open Heart Surgery;  Laterality: N/A;    Family History  Problem Relation Age of Onset  . Hypertension Other     Social History:  reports  that he has been smoking Cigarettes.  He has a 26 pack-year smoking history. He does not have any smokeless tobacco history on file. He reports that he does not drink alcohol or use illicit drugs.  Allergies:  Allergies  Allergen Reactions  . Codeine Nausea Only  . Macrodantin [Nitrofurantoin Macrocrystal] Rash  . Morphine And Related Rash    Medications: I have reviewed the patient's current medications.  Results for orders placed or performed during the hospital encounter of 01/01/16 (from the past 48 hour(s))  Basic metabolic panel     Status: Abnormal   Collection Time: 01/01/16  1:24 PM  Result Value Ref Range   Sodium 142 135 - 145 mmol/L   Potassium 3.9 3.5 - 5.1 mmol/L   Chloride 106 101 - 111 mmol/L   CO2 26 22 - 32 mmol/L   Glucose, Bld 114 (H) 65 - 99 mg/dL   BUN <5 (L) 6 - 20 mg/dL   Creatinine, Ser 1.03 0.61 - 1.24 mg/dL   Calcium 9.2 8.9 - 10.3 mg/dL   GFR calc non Af Amer >60 >60 mL/min   GFR calc Af Amer >60 >60 mL/min    Comment: (NOTE) The eGFR has been calculated using the CKD EPI equation. This calculation has not been validated in all clinical situations. eGFR's persistently <60 mL/min signify possible Chronic Kidney Disease.    Anion gap 10 5 - 15  CBC     Status: None   Collection Time: 01/01/16  1:24 PM  Result Value Ref Range   WBC 6.9 4.0 - 10.5 K/uL   RBC 4.60 4.22 - 5.81 MIL/uL   Hemoglobin 13.3 13.0 - 17.0 g/dL   HCT 41.1 39.0 - 52.0 %   MCV 89.3 78.0 - 100.0 fL   MCH 28.9 26.0 - 34.0 pg   MCHC 32.4 30.0 - 36.0 g/dL   RDW 14.9 11.5 - 15.5 %   Platelets 169 150 - 400 K/uL  I-stat troponin, ED (not at [facility], [facility])     Status: None   Collection Time: 01/01/16  1:31 PM  Result Value Ref Range   Troponin i, poc 0.01 0.00 - 0.08 ng/mL   Comment 3            Comment: Due to the release kinetics of cTnI, a negative result within the first hours of the onset of symptoms does not rule out myocardial infarction with certainty. If myocardial  infarction is still suspected, repeat the test at appropriate intervals.   Brain natriuretic peptide     Status: None   Collection Time: 01/01/16  7:57 PM  Result Value Ref Range   B Natriuretic Peptide 71.5 0.0 - 100.0 pg/mL  I-Stat Troponin, ED - 0, 3, 6 hours (not at [facility])     Status: None   Collection Time: 01/01/16  8:00 PM  Result Value Ref Range   Troponin i, poc 0.01 0.00 - 0.08 ng/mL   Comment 3  Comment: Due to the release kinetics of cTnI, a negative result within the first hours of the onset of symptoms does not rule out myocardial infarction with certainty. If myocardial infarction is still suspected, repeat the test at appropriate intervals.     Dg Chest 2 View  01/01/2016  CLINICAL DATA:  Chest pain today. History of COPD and mitral valve surgery. EXAM: CHEST  2 VIEW COMPARISON:  Radiographs 11/10/2015 and 10/29/2015. FINDINGS: The heart size and mediastinal contours are stable status post left atrial appendage clamping and mitral valve replacement. There is mild aortic atherosclerosis. There is interval improved aeration of the lung bases with no residual pleural effusion. There is mild chronic lung disease with basilar scarring and chronic vascular congestion. No pneumothorax. The bones appear unchanged with a lower thoracic compression deformity. IMPRESSION: Interval improved aeration of the lung bases. No acute cardiopulmonary process. Electronically Signed   By: Richardean Sale M.D.   On: 01/01/2016 13:57    Review of Systems  Constitutional: Negative for fever, chills and weight loss.  HENT: Negative for ear discharge and ear pain.   Eyes: Negative for pain, discharge and redness.  Respiratory: Negative for cough, hemoptysis and sputum production.   Cardiovascular: Positive for chest pain. Negative for palpitations and orthopnea.  Gastrointestinal: Negative for nausea, vomiting and abdominal pain.  Genitourinary: Negative for dysuria, urgency and  frequency.  Musculoskeletal: Negative for myalgias, back pain and neck pain.  Skin: Negative for rash.  Neurological: Negative for tingling, tremors, sensory change and headaches.  Endo/Heme/Allergies: Negative for polydipsia. Does not bruise/bleed easily.  Psychiatric/Behavioral: Negative for depression, suicidal ideas and substance abuse.   Blood pressure 139/95, pulse 63, temperature 98.4 F (36.9 C), temperature source Oral, resp. rate 16, SpO2 99 %. Physical Exam  Nursing note and vitals reviewed. Constitutional: He is oriented to person, place, and time. He appears well-developed and well-nourished. No distress.  HENT:  Head: Normocephalic and atraumatic.  Nose: Nose normal.  Mouth/Throat: Oropharynx is clear and moist. No oropharyngeal exudate.  Eyes: Conjunctivae and EOM are normal. Pupils are equal, round, and reactive to light. No scleral icterus.  Neck: Normal range of motion. Neck supple. No JVD present. No tracheal deviation present.  Cardiovascular: Normal rate, regular rhythm, normal heart sounds and intact distal pulses.  Exam reveals no gallop.   No murmur heard. Respiratory: Effort normal and breath sounds normal. No respiratory distress. He has no wheezes.  GI: Soft. Bowel sounds are normal. He exhibits no distension. There is no tenderness. There is no rebound.  Musculoskeletal: Normal range of motion. He exhibits no edema or tenderness.  Neurological: He is alert and oriented to person, place, and time. No cranial nerve deficit.  Skin: Skin is warm and dry. No rash noted. He is not diaphoretic. No erythema.  Psychiatric: He has a normal mood and affect. His behavior is normal. Thought content normal.  labs reviewed; ECG SR, V4-V6 ST depression stable from previous in December Trop negative x2  Assessment/Plan: : [name] is a 75 yo man with PMH of rheumatic fever, fibromyalgia, COPD and severe MR s/p MR repair via right thoracotomy by Dr. Roxy Delacruz November 2016 who  came to the ER shortly after lunch time at urging of Michael Delacruz office for chest pain. Differential diagnosis is musculoskeletal pain, esophageal spasm, GERD, pericarditis, ACS/NSTEMI among other etiologies. I favor a diagnosis of atypical chest discomfort given character/nature of discomfort lack of association with exertion, negative troponins despite duration and history of new activity/snow  shoveling yesterday. Given resolved symptoms (no pain actually while in the ER), trended troponins over 8 hours both negative and atypical feature, he will discharge home with routine follow-up. He understands ER precautions and if symptoms recur or are associated with exertion then to call 911.  - no changes to medical therapy currently from 1/11 visit with Dr. Iver Nestle, [place] Pelaez 01/01/2016, 10:47 PM

## 2016-01-02 ENCOUNTER — Telehealth: Payer: Self-pay | Admitting: *Deleted

## 2016-01-02 NOTE — Telephone Encounter (Signed)
Lm for patient to make ED fu appointment

## 2016-01-07 ENCOUNTER — Telehealth: Payer: Self-pay | Admitting: *Deleted

## 2016-01-07 NOTE — Telephone Encounter (Signed)
Spoke w/ pt's wife.  Advised her that I make Dr. [name] aware of pt's sx and he recommends that pt: - Call ophthalmologist - Hold Eliquis until opthalmology appt  - Take Lasix prn for SOB and/or swelling She reports that pt had "grey over his eyes yesterday and is seeing spiderwebs today". Pt is wearing patch over affected eye today. She has not called eye doctor, as pt does not know that she has contacted any of his providers. She has resumed putting drops in pt's eyes that were previously d/c'd by ophthalmologist. She reports that pt c/o pain in his head and shoulders. She will follow Dr. Donivan Scull recommendation and call back if we can be of further assistance.  She asks if pt's appt on Monday w/ Christell Faith, PA can be moved to Dr. Donivan Scull schedule. Advised her that Dr. [name] does not have an opening on that day and Thurmond Butts will take good care of pt.

## 2016-01-07 NOTE — Telephone Encounter (Signed)
Pt wife calling stating pt this morning and throughout the day has been having some SOB  Pt c/o Shortness Of Breath: STAT if SOB developed within the last 24 hours or pt is noticeably SOB on the phone  1. Are you currently SOB (can you hear that pt is SOB on the phone)?   2. How long have you been experiencing SOB? All day  3. Are you SOB when sitting or when up moving around?  Laying in bed.   4. Are you currently experiencing any other symptoms? He did have a headache last night.

## 2016-01-10 ENCOUNTER — Encounter: Payer: Self-pay | Admitting: Physician Assistant

## 2016-01-10 NOTE — Progress Notes (Signed)
Cardiology Office Note Date:  01/12/2016  Patient ID:  Michael Delacruz 1941-11-10, MRN [id number] PCP:  No PCP Per Patient  Cardiologist:  Dr. [name], MD    Chief Complaint: f/u ER visit  History of Present Illness: Michael Delacruz is a 75 y.o. male with history of rheumatic fever in childhood, tobacco abuse, COPD, severe MR s/p repair 10/2015, fibromyalgia, chronic diastolic CHF, TIA, CVA [id number] with right hand weakness now resolved, PAF s/p TEE/DCCV 07/2015 (& MAZE at time of MV repair 10/2015), bradycardia, mild nonobstructive CAD by cath 09/2015 who presents for post-ER f/u. Of note he has history of bleeding in his right eye with associated blindness on warfarin in the setting of supratherapeutic INR >5 and thus his primary cardiologist has changed him to Eliquis 2.5 mg bid followed by titration to Eliquis 5 mg daily, though patient reports taking this 5 mg bid. 2D echo 11/26/15: EF 0000000, LV diastolic function normal, mod dilated aortic root, mild MR, mildly dilated LA. LHC 09/2015: 40% pCx, 35% mRCA.  He was seen in the ER 01/01/16 with chest pain that went to arm and shoulder, sharp, no tassociated with exertion. He had been able to exercise and shovel snow without symptoms. EKG was stable and troponins were negative. The patient felt the pain was similar to other locations as pain he has had with his fibromyalgia. BNP totally normal. CBC/BMET unremarkable. CXR NAD. He called the office on 1/18 with blindness of the right eye with associated headache. He was advised to call his ophthalmologist and stop Eliquis at that time. He has not taken Eliquis since 1/18. He was not seen in the ED at that time. He reports having an eye MD appointment in February. He continues to suffer from loss of vision in the right eye. He has not had any further chest pain or SOB. He does report increase "gas." No tachy-palpitations.       Past Medical History  Diagnosis Date  . COPD (chronic obstructive  pulmonary disease) ([place])   . History of blood clots     eye   . Dilation of intestine 01/2015  . Asthma   . Severe mitral regurgitation     a. s/p MV repair 10/2015.  [name] PAF (paroxysmal atrial fibrillation) ([place])     a. s/p TEE/DCCV 07/2015. b. H/o bleeding on Coumadin when INR >5, changed to Eliquis.  [name] History of rheumatic fever   . Tobacco abuse   . Chronic diastolic congestive heart failure ([place])   . Hypertension   . Stroke ([place])   . Kidney stone   . History of hiatal hernia   . GERD (gastroesophageal reflux disease)   . Arthritis   . Fibromyalgia   . S/P minimally invasive mitral valve repair 10/22/2015    Complex valvuloplasty including triangular resection of posterior leaflet, artificial Gore-tex neochord placement x6 and 38 mm Sorin Memo 3D Rechord ring annuloplasty via right minithoracotomy approach  . S/P Minimally invasive maze operation for atrial fibrillation 10/22/2015    Complete bilateral atrial lesion set using cryothermy and bipolar radiofrequency ablation with clipping of LA appendage via right mini thoracotomy approach  . TIA (transient ischemic attack)   . Bradycardia   . CAD in native artery     a. LHC 09/2015: 40% pCx, 35% mRCA.    Past Surgical History  Procedure Laterality Date  . Ankle surgery    . Tee without cardioversion N/A 08/18/2015    Procedure: TRANSESOPHAGEAL ECHOCARDIOGRAM (TEE);  Surgeon: Wellington Hampshire, MD;  Location: [facility];  Service: Cardiovascular;  Laterality: N/A;  . Electrophysiologic study N/A 08/18/2015    Procedure: CARDIOVERSION;  Surgeon: Wellington Hampshire, MD;  Location: [facility];  Service: Cardiovascular;  Laterality: N/A;  . Cardiac catheterization N/A 10/03/2015    Procedure: Right and Left Heart Cath and Coronary Angiography;  Surgeon: [name], MD;  Location: [facility];  Service: Cardiovascular;  Laterality: N/A;  . Colonoscopy    . Sinus exploration    . Mitral valve repair Right 10/22/2015    Procedure:  MINIMALLY INVASIVE MITRAL VALVE REPAIR (MVR);  Surgeon: Rexene Alberts, MD;  Location: [facility];  Service: Open Heart Surgery;  Laterality: Right;  . Minimally invasive maze procedure N/A 10/22/2015    Procedure: MINIMALLY INVASIVE MAZE PROCEDURE;  Surgeon: Rexene Alberts, MD;  Location: [facility];  Service: Open Heart Surgery;  Laterality: N/A;  . Tee without cardioversion N/A 10/22/2015    Procedure: TRANSESOPHAGEAL ECHOCARDIOGRAM (TEE);  Surgeon: Rexene Alberts, MD;  Location: [facility];  Service: Open Heart Surgery;  Laterality: N/A;    Current Outpatient Prescriptions  Medication Sig Dispense Refill  . ALPRAZolam (XANAX) 0.5 MG tablet Take 1 tablet (0.5 mg total) by mouth 3 (three) times daily as needed for anxiety or sleep. (Patient taking differently: Take 0.25 mg by mouth 2 (two) times daily. ) 20 tablet 0   No current facility-administered medications for this visit.    Allergies:   Codeine; Macrodantin; and Morphine and related   Social History:  The patient  reports that he has been smoking Cigarettes.  He has a 13 pack-year smoking history. He does not have any smokeless tobacco history on file. He reports that he does not drink alcohol or use illicit drugs.   Family History:  The patient's family history includes Hypertension in his other.  ROS:   Review of Systems  Constitutional: Positive for malaise/fatigue. Negative for fever, chills, weight loss and diaphoresis.  HENT: Negative for congestion and nosebleeds.   Eyes: Negative for blurred vision, double vision, photophobia, pain, discharge and redness.       Loss of vision along right eye  Respiratory: Negative for cough, hemoptysis, sputum production, shortness of breath and wheezing.   Cardiovascular: Negative for chest pain, palpitations, orthopnea, claudication, leg swelling and PND.  Gastrointestinal: Negative for heartburn, nausea, vomiting, abdominal pain, diarrhea, constipation, blood in stool and melena.  Musculoskeletal:  Negative for myalgias and falls.  Skin: Negative for rash.  Neurological: Positive for headaches. Negative for dizziness, tingling, tremors, sensory change, speech change, focal weakness, seizures, loss of consciousness and weakness.  Endo/Heme/Allergies: Does not bruise/bleed easily.  Psychiatric/Behavioral: Positive for memory loss. Negative for depression, suicidal ideas, hallucinations and substance abuse. The patient is nervous/anxious. The patient does not have insomnia.   All other systems reviewed and are negative.     PHYSICAL EXAM:  VS:  BP 110/82 mmHg  Pulse 60  Ht 6\' 1"  (1.854 m)  Wt 199 lb (90.266 kg)  BMI 26.26 kg/m2 BMI: Body mass index is 26.26 kg/(m^2). Well nourished, well developed, in no acute distress HEENT: normocephalic, atraumatic Neck: no JVD, carotid bruits or masses Cardiac:  normal S1, S2; RRR; no murmurs, rubs, or gallops Lungs:  clear to auscultation bilaterally, no wheezing, rhonchi or rales Abd: soft, nontender, no hepatomegaly, + BS MS: no deformity or atrophy Ext: no edema Skin: warm and dry, no rash Neuro:  moves all extremities spontaneously, no  focal abnormalities noted, follows commands Psych: euthymic mood, full affect   EKG:  Was ordered today. Shows sinus rhythm, 60 bpm, inferolateral st depression  Recent Labs: 10/23/2015: Magnesium 2.4 12/05/2015: ALT 10* 12/06/2015: TSH 3.162 01/01/2016: B Natriuretic Peptide 71.5; BUN <5*; Creatinine, Ser 1.03; Hemoglobin 13.3; Platelets 169; Potassium 3.9; Sodium 142  12/06/2015: Cholesterol 169; HDL 36*; LDL Cholesterol 116*; Total CHOL/HDL Ratio 4.7; Triglycerides 85; VLDL 17   Estimated Creatinine Clearance: 73.3 mL/min (by C-G formula based on Cr of 1.03).   Wt Readings from Last 3 Encounters:  01/12/16 199 lb (90.266 kg)  12/31/15 199 lb 8 oz (90.493 kg)  12/06/15 192 lb 1.6 oz (87.136 kg)     Other studies reviewed: Additional studies/records reviewed today include: summarized  above  ASSESSMENT AND PLAN:  1. Chest pain, atypical: -Declines further evaluation at this time. I offered nuclear stress testing to evaluate for high risk ischemia, he declined -No further symptoms -Possibly 2/2 his fibromyalgia vs anxiety  -Continue to monitor    2. Mitral regurgitation s/p MV repair: -Stable  3. PAF s/p DCCV and MAZE as above: -Sinus rhythm -Not on any rate controlling medications -He has been off Eliquis since 1/18 given phone call for blindness of right eye -There is minimal evidence to support the usage of NOAC's in valvular Afib. Not currently on long term, full-dose anticoagulation given the above possible bleed along the right eye (has history of bleed posterior to right eye in the setting of supratherapeutic INR). Cannot rule out other etiology such as clot vs retinal detachment, though clot may be less likely as the patient does report taking Eliquis 5 mg bid; however, there is minimal evidence for the medication usage in this patient -His CHADS2VASc calculates to be at least 5 (CHF, HTN, age x 1, stroke) -Should he go back on full dose anticoagulation would use warfarin given the above, will leave this up to his primary cardiologist -He is aware of the increased stroke risk  4.   Blindness of right eye: -Offered patient MRI/CT of the brain/head, he declined -Offered patient ED, he declined  -Office called his ophthalmologist in an effort to get his appointment moved up -Advised patient his blindness may be permanent given the delay in treatment and unknown etiology  5.   Mild CAD: -Not on aspirin as above -Declines stress testing as above -Heart rate precludes usage of beta blocker   6.   Fibroymalgia: -Per prior notes his chest pain symptoms are similar to his fibromyalgia -Cannot rule this out as playing a role in the above  7.   Reflux: -He eats a poor diet -He declines PPI or H2 -Offered GI evaluation, he declined -Advised lifestyle  changes  Disposition: F/u with Dr. [name], MD 3 months  Current medicines are reviewed at length with the patient today.  The patient did not have any concerns regarding medicines.  Addendum: -Patient apparently got dizzy in the lobby after hearing his follow up with Dr. [name], MD was in 3 months. He stated "I'll have separation anxiety." His vitals were stable, including pulse ox of 97%. He never suffered LOC. He was advised to go to the ED for further evaluation of his dizziness as well as his eye blindness. He declined stating his wife had a chiropractor appointment she needed to go to. He left ambulating under his own power in stable condition.   Michael Banker PA-C 01/12/2016 9:52 AM     [place] [id number]  [address] [place], [place] [id number] [phone]

## 2016-01-12 ENCOUNTER — Encounter: Payer: Self-pay | Admitting: Physician Assistant

## 2016-01-12 ENCOUNTER — Ambulatory Visit (INDEPENDENT_AMBULATORY_CARE_PROVIDER_SITE_OTHER): Payer: Medicare HMO | Admitting: Physician Assistant

## 2016-01-12 VITALS — BP 110/82 | HR 60 | Ht 73.0 in | Wt 199.0 lb

## 2016-01-12 DIAGNOSIS — H5441 Blindness, right eye, normal vision left eye: Secondary | ICD-10-CM

## 2016-01-12 DIAGNOSIS — Z9889 Other specified postprocedural states: Secondary | ICD-10-CM | POA: Diagnosis not present

## 2016-01-12 DIAGNOSIS — I480 Paroxysmal atrial fibrillation: Secondary | ICD-10-CM | POA: Diagnosis not present

## 2016-01-12 DIAGNOSIS — H5440 Blindness, one eye, unspecified eye: Secondary | ICD-10-CM

## 2016-01-12 DIAGNOSIS — M797 Fibromyalgia: Secondary | ICD-10-CM

## 2016-01-12 DIAGNOSIS — R0789 Other chest pain: Secondary | ICD-10-CM | POA: Diagnosis not present

## 2016-01-12 DIAGNOSIS — I340 Nonrheumatic mitral (valve) insufficiency: Secondary | ICD-10-CM | POA: Diagnosis not present

## 2016-01-12 DIAGNOSIS — I2510 Atherosclerotic heart disease of native coronary artery without angina pectoris: Secondary | ICD-10-CM

## 2016-01-12 NOTE — Patient Instructions (Signed)
Medication Instructions:  Your physician recommends that you continue on your current medications as directed. Please refer to the Current Medication list given to you today.  Labwork: none  Testing/Procedures: none  Follow-Up: Your physician recommends that you schedule a follow-up appointment in: 3 months with Dr. [name]   Any Other Special Instructions Will Be Listed Below (If Applicable). The nurse at [facility] will call you with a new appointment date.      If you need a refill on your cardiac medications before your next appointment, please call your pharmacy.

## 2016-01-12 NOTE — Patient Instructions (Signed)
Pt had appt w/Ryan Dunn, PA-C, this morning. After leaving with his wife, they stopped on the first floor for wife to use the restroom. While she was in the restroom, pt states he felt like he was going to pass out. States he "quickly ran over to a chair and sat down". Wife called back to our office. Pt transported by wheelchair back to the office.  Vital signs taken BP 118/70, HR 71, oxygen 96% on room air. Pt states he is not diabetic but asks for a piece of candy. Stood up from wheelchair with no problems.  Reviewed information w/Ryan Dunn who advises pt to be seen in the ER for a workup if he feels sx need immediate attention. Pt states wife has a chiropractor appt this morning and when he went to [facility] ER before, he waited 12 hours.  Advised pt to monitor s/s and seek immediate attention if sx worsen. Pt and wife verbalized understanding with no further questions.

## 2016-02-02 ENCOUNTER — Encounter: Payer: Self-pay | Admitting: Thoracic Surgery (Cardiothoracic Vascular Surgery)

## 2016-02-02 ENCOUNTER — Ambulatory Visit (INDEPENDENT_AMBULATORY_CARE_PROVIDER_SITE_OTHER): Payer: Medicare HMO | Admitting: Thoracic Surgery (Cardiothoracic Vascular Surgery)

## 2016-02-02 VITALS — BP 135/86 | HR 57 | Resp 16 | Ht 73.0 in | Wt 199.0 lb

## 2016-02-02 DIAGNOSIS — I480 Paroxysmal atrial fibrillation: Secondary | ICD-10-CM | POA: Diagnosis not present

## 2016-02-02 DIAGNOSIS — Z8679 Personal history of other diseases of the circulatory system: Secondary | ICD-10-CM

## 2016-02-02 DIAGNOSIS — I340 Nonrheumatic mitral (valve) insufficiency: Secondary | ICD-10-CM | POA: Diagnosis not present

## 2016-02-02 DIAGNOSIS — Z9889 Other specified postprocedural states: Secondary | ICD-10-CM

## 2016-02-02 MED ORDER — ASPIRIN EC 81 MG PO TBEC: 81.0000 mg | DELAYED_RELEASE_TABLET | Freq: Every day | ORAL | Status: DC

## 2016-02-02 NOTE — Progress Notes (Signed)
[address]       [place] [address]             [phone]     CARDIOTHORACIC SURGERY OFFICE NOTE  Referring Provider is [name], MD PCP is No PCP Per Patient   HPI:  Patient is a 75 year old male returns to our office today for routine follow-up approximately 3 months status post minimally invasive mitral valve repair and maze procedure on 10/22/2015 for severe symptomatic primary mitral regurgitation with persistent atrial fibrillation. His early postoperative recovery in the hospital was notable for sinus node dysfunction and bradycardia, but this resolved uneventfully and he was discharged from the hospital in sinus rhythm on the seventh postoperative day. He was initially discharged from the hospital on warfarin for anticoagulation. Warfarin was eventually stopped after the patient developed blurry vision in his right eye that was felt to be related to elevated intraocular pressure.  Subsequent to that the patient was started on dual antiplatelet therapy using low-dose aspirin and Plavix. In December the patient suffered a minor TIA with symptoms of severe weakness in his right hand.  Symptoms resolved quickly but a small non-hemorrhagic cortical stroke in the left precentral gyrus was confirmed on MRI.  Warfarin was restarted but the patient's visual symptoms recurred. Eventually he was started on Eliquis as an alternative. However, last month the patient again developed recurrent blurry vision in his right eye. He states that he was told that there was increased intraocular pressure and this was secondary to blood thinner medications. He has been off of all types of blood thinners for the last few weeks. He returns to our office today for follow-up. He states that he intermittently will have transient mild stabbing pains on either side of the chest that seemed to come and go sporadically and are unrelated to physical activity. Overall he otherwise feels  quite good. He has no exertional shortness of breath or chest discomfort. He has not been having palpitations or dizzy spells. He states that he did come down with an upper respiratory tract infection last week. After several days of low-grade fever and feeling poorly he was seen at urgent care and given a prescription for [facility] and a cough suppressant. He states that he is feeling much better and currently doing quite well.  Current Outpatient Prescriptions  Medication Sig Dispense Refill  . benzonatate (TESSALON) 100 MG capsule Take by mouth 3 (three) times daily as needed for cough.    . cefdinir (OMNICEF) 300 MG capsule Take 600 mg by mouth daily.    [name] ALPRAZolam (XANAX) 0.5 MG tablet Take 1 tablet (0.5 mg total) by mouth 3 (three) times daily as needed for anxiety or sleep. (Patient taking differently: Take 0.25 mg by mouth 2 (two) times daily. ) 20 tablet 0   No current facility-administered medications for this visit.      Physical Exam:   BP 135/86 mmHg  Pulse 57  Resp 16  Ht 6\' 1"  (1.854 m)  Wt 199 lb (90.266 kg)  BMI 26.26 kg/m2  SpO2 98%  General:  Well-appearing  Chest:   Clear to auscultation  CV:   Regular rate and rhythm without murmur  Incisions:  Well-healed  Abdomen:  Soft and nontender  Extremities:  Warm and well-perfused  Diagnostic Tests:  2 channel telemetry rhythm strip demonstrates normal sinus rhythm   Transthoracic Echocardiography  Patient:  Joanna, Dorey MR #:    [id number] Study Date:  11/26/2015 Gender:   M Age:    30 Height:   185.4 cm Weight:   87.5 kg BSA:    2.13 m^2 Pt. Status: Room:  SONOGRAPHER [facility] ATTENDING  [place], Tim Dannielle Karvonen, Tim REFERRING  Gollan, Tim PERFORMING  Chmg,   cc:  ------------------------------------------------------------------- LV EF: 55% -  60%  ------------------------------------------------------------------- History:  PMH:   Fatigue and weakness. Atrial fibrillation. Mitral valve disease. Stroke. Chronic obstructive pulmonary disease. Risk factors: Current tobacco use. Hypertension. Dyslipidemia.  ------------------------------------------------------------------- Study Conclusions  - Left ventricle: The cavity size was normal. Systolic function was normal. The estimated ejection fraction was in the range of 55% to 60%. Wall motion was normal; there were no regional wall motion abnormalities. Left ventricular diastolic function parameters were normal. - Aorta: Aortic root dimension: 44 mm (ED). - Aortic root: The aortic root was moderately dilated. - Mitral valve: There was mild regurgitation. - Left atrium: The atrium was mildly dilated. - Right ventricle: Systolic function was normal. - Pulmonary arteries: Systolic pressure was within the normal range.  ------------------------------------------------------------------- Labs, prior tests, procedures, and surgery: Echocardiography (August 2016).   EF was 65%.  Catheterization (November 2016).  Mitral valvuloplasty was performed. EF was 45%. s/p MAZE procedure. Transthoracic echocardiography. M-mode, complete 2D, spectral Doppler, and color Doppler. Birthdate: Patient birthdate: Nov 26, 1941. Age: Patient is 75 yr old. Sex: Gender: male. BMI: 25.5 kg/m^2. Blood pressure:   100/62 Patient status: Outpatient. Study date: Study date: 11/26/2015. Study time: 10:32 AM.  -------------------------------------------------------------------  ------------------------------------------------------------------- Left ventricle: The cavity size was normal. Systolic function was normal. The estimated ejection fraction was in the range of 55% to 60%. Wall motion was normal; there were no regional wall motion abnormalities. The transmitral flow pattern was normal. The deceleration time of the early transmitral flow velocity  was normal. The pulmonary vein flow pattern was normal. The tissue Doppler parameters were normal. Left ventricular diastolic function parameters were normal.  ------------------------------------------------------------------- Aortic valve:  Trileaflet; normal thickness, mildly calcified leaflets. Mobility was not restricted. Doppler: Transvalvular velocity was within the normal range. There was no stenosis. There was no regurgitation.  ------------------------------------------------------------------- Aorta: Aortic root: The aortic root was moderately dilated.  ------------------------------------------------------------------- Mitral valve: S/p mitral valvuloplasty, normal leaflets. Mobility was not restricted. Doppler: Transvalvular velocity was within the normal range. There was no evidence for stenosis. There was mild regurgitation.  Peak gradient (D): 9 mm Hg.  ------------------------------------------------------------------- Left atrium: The atrium was mildly dilated.  ------------------------------------------------------------------- Right ventricle: The cavity size was normal. Wall thickness was normal. Systolic function was normal.  ------------------------------------------------------------------- Pulmonic valve:  Structurally normal valve.  Cusp separation was normal. Doppler: Transvalvular velocity was within the normal range. There was no evidence for stenosis. There was trivial regurgitation.  ------------------------------------------------------------------- Tricuspid valve:  Structurally normal valve.  Doppler: Transvalvular velocity was within the normal range. There was mild regurgitation.  ------------------------------------------------------------------- Pulmonary artery:  The main pulmonary artery was normal-sized. Systolic pressure was within the normal  range.  ------------------------------------------------------------------- Right atrium: The atrium was normal in size.  ------------------------------------------------------------------- Pericardium: There was no pericardial effusion.  ------------------------------------------------------------------- Systemic veins: Inferior vena cava: The vessel was normal in size.  ------------------------------------------------------------------- Measurements  Left ventricle               Value    Reference LV ID, ED, PLAX chordal       (L)   41.8 mm   43 - 52 LV ID, ES, PLAX chordal       (L)  20.8 mm   23 - 38 LV fx shortening, PLAX chordal       50  %   >=29 LV PW thickness, ED             11.8 mm   --------- IVS/LV PW ratio, ED             1.2     <=1.3 Stroke volume, 2D              120  ml   --------- Stroke volume/bsa, 2D            56  ml/m^2 --------- LV e&', lateral               7.7  cm/s  --------- LV E/e&', lateral              18.96    --------- LV e&', medial                7.31 cm/s  --------- LV E/e&', medial               19.97    --------- LV e&', average               7.51 cm/s  --------- LV E/e&', average              19.45    ---------  Ventricular septum             Value    Reference IVS thickness, ED              14.2 mm   ---------  LVOT                    Value    Reference LVOT ID, S                 27  mm   --------- LVOT area                  5.73 cm^2  --------- LVOT ID                   27  mm   --------- LVOT peak velocity, S            104  cm/s  --------- LVOT mean velocity, S             68.1 cm/s  --------- LVOT VTI, S                 21  cm   --------- LVOT peak gradient, S            4   mm Hg --------- Stroke volume (SV), LVOT DP         120.2 ml   --------- Stroke index (SV/bsa), LVOT DP       56.4 ml/m^2 ---------  Aorta                    Value    Reference Aortic root ID, ED             44  mm   ---------  Left atrium                 Value    Reference LA ID, A-P, ES               42  mm   --------- LA ID/bsa, A-P  1.97 cm/m^2 <=2.2 LA volume, S                74  ml   --------- LA volume/bsa, S              34.7 ml/m^2 --------- LA volume, ES, 1-p A4C           64  ml   --------- LA volume/bsa, ES, 1-p A4C         30  ml/m^2 --------- LA volume, ES, 1-p A2C           85  ml   --------- LA volume/bsa, ES, 1-p A2C         39.9 ml/m^2 ---------  Mitral valve                Value    Reference Mitral E-wave peak velocity         146  cm/s  --------- Mitral A-wave peak velocity         52.4 cm/s  --------- Mitral deceleration time      (H)   356  ms   150 - 230 Mitral peak gradient, D           9   mm Hg --------- Mitral E/A ratio, peak           2      --------- Mitral maximal regurg velocity,       353  cm/s  --------- PISA  Pulmonary arteries             Value    Reference PA pressure, S, DP             14  mm Hg <=30  Tricuspid valve               Value    Reference Tricuspid regurg peak velocity       166  cm/s  --------- Tricuspid peak RV-RA gradient        11  mm Hg ---------  Systemic veins                Value    Reference Estimated CVP                3   mm Hg ---------  Right ventricle               Value    Reference RV pressure, S, DP             14  mm Hg <=30 RV s&', lateral, S              9.5  cm/s  ---------  Legend: (L) and (H) mark values outside specified reference range.  ------------------------------------------------------------------- Prepared and Electronically Authenticated by  Esmond Plants, MD, [facility] 2016-12-07T18:37:39   Impression:  Patient is currently doing very well approximately 3 months status post minimally invasive mitral valve repair and Maze procedure. He is maintaining sinus rhythm and clinically doing well.  Follow-up echocardiogram performed in early December revealed intact mitral valve repair with normal left ventricular systolic function.  Despite the fact that he suffered a minor stroke, the patient has been reluctant to take any medications, including a most notably all types of blood thinners.  It is not clear to me whether or not he has ever had any bleeding complications, but he does claim that when he was on warfarin or Eliquis he had decreased vision in his right eye he believes is  related to those medications.  Although he is maintaining sinus rhythm and he underwent Maze procedure with clipping of his left atrial appendage at the time of surgery, he did experience a minor embolic stroke approximately one month after his surgery at which time he was taking only aspirin and Plavix.  His CHADS/VASC score is at least 4.  At this time he is not even taking aspirin.   Plan:  I have recommended that the patient resume taking an aspirin a day as a bare minimum to decrease his risk of thromboembolism and stroke.  We have not recommended any other changes to his current medications at this time. He has been encouraged to continue to increase his physical activity as  tolerated without any particular limitations related to his previous surgery.  Although the patient is edentulous, he has been reminded regarding the importance of hygiene and the lifelong need for antibiotic prophylaxis for invasive procedures. All of his questions have been addressed. The patient will return for routine follow-up next November, approximately 1 year following his original surgery. During the interim period of time he will continue to follow-up with Dr. [name].   Valentina Gu. Roxy Manns, MD 02/02/2016 12:47 PM

## 2016-02-02 NOTE — Patient Instructions (Signed)
You may resume unrestricted physical activity without any particular limitations at this time.  Begin taking aspirin daily now that you are off of all other blood thinner medications  Endocarditis is a potentially serious infection of heart valves or inside lining of the heart.  It occurs more commonly in patients with diseased heart valves (such as patient's with aortic or mitral valve disease) and in patients who have undergone heart valve repair or replacement.  Certain surgical and dental procedures may put you at risk, such as dental cleaning, other dental procedures, or any surgery involving the respiratory, urinary, gastrointestinal tract, gallbladder or prostate gland.   To minimize your chances for develooping endocarditis, maintain good oral health and seek prompt medical attention for any infections involving the mouth, teeth, gums, skin or urinary tract.    Always notify your doctor or dentist about your underlying heart valve condition before having any invasive procedures. You will need to take antibiotics before certain procedures, including all routine dental cleanings or other dental procedures.  Your cardiologist or dentist should prescribe these antibiotics for you to be taken ahead of time.

## 2016-08-25 IMAGING — DX DG CHEST 1V PORT
1 series · 1 of 1 positions shown · non-contrast
Comparison: Yesterday.

CLINICAL DATA: Followup probable bibasilar atelectasis.

EXAM:
PORTABLE CHEST 1 VIEW

[chest ap]
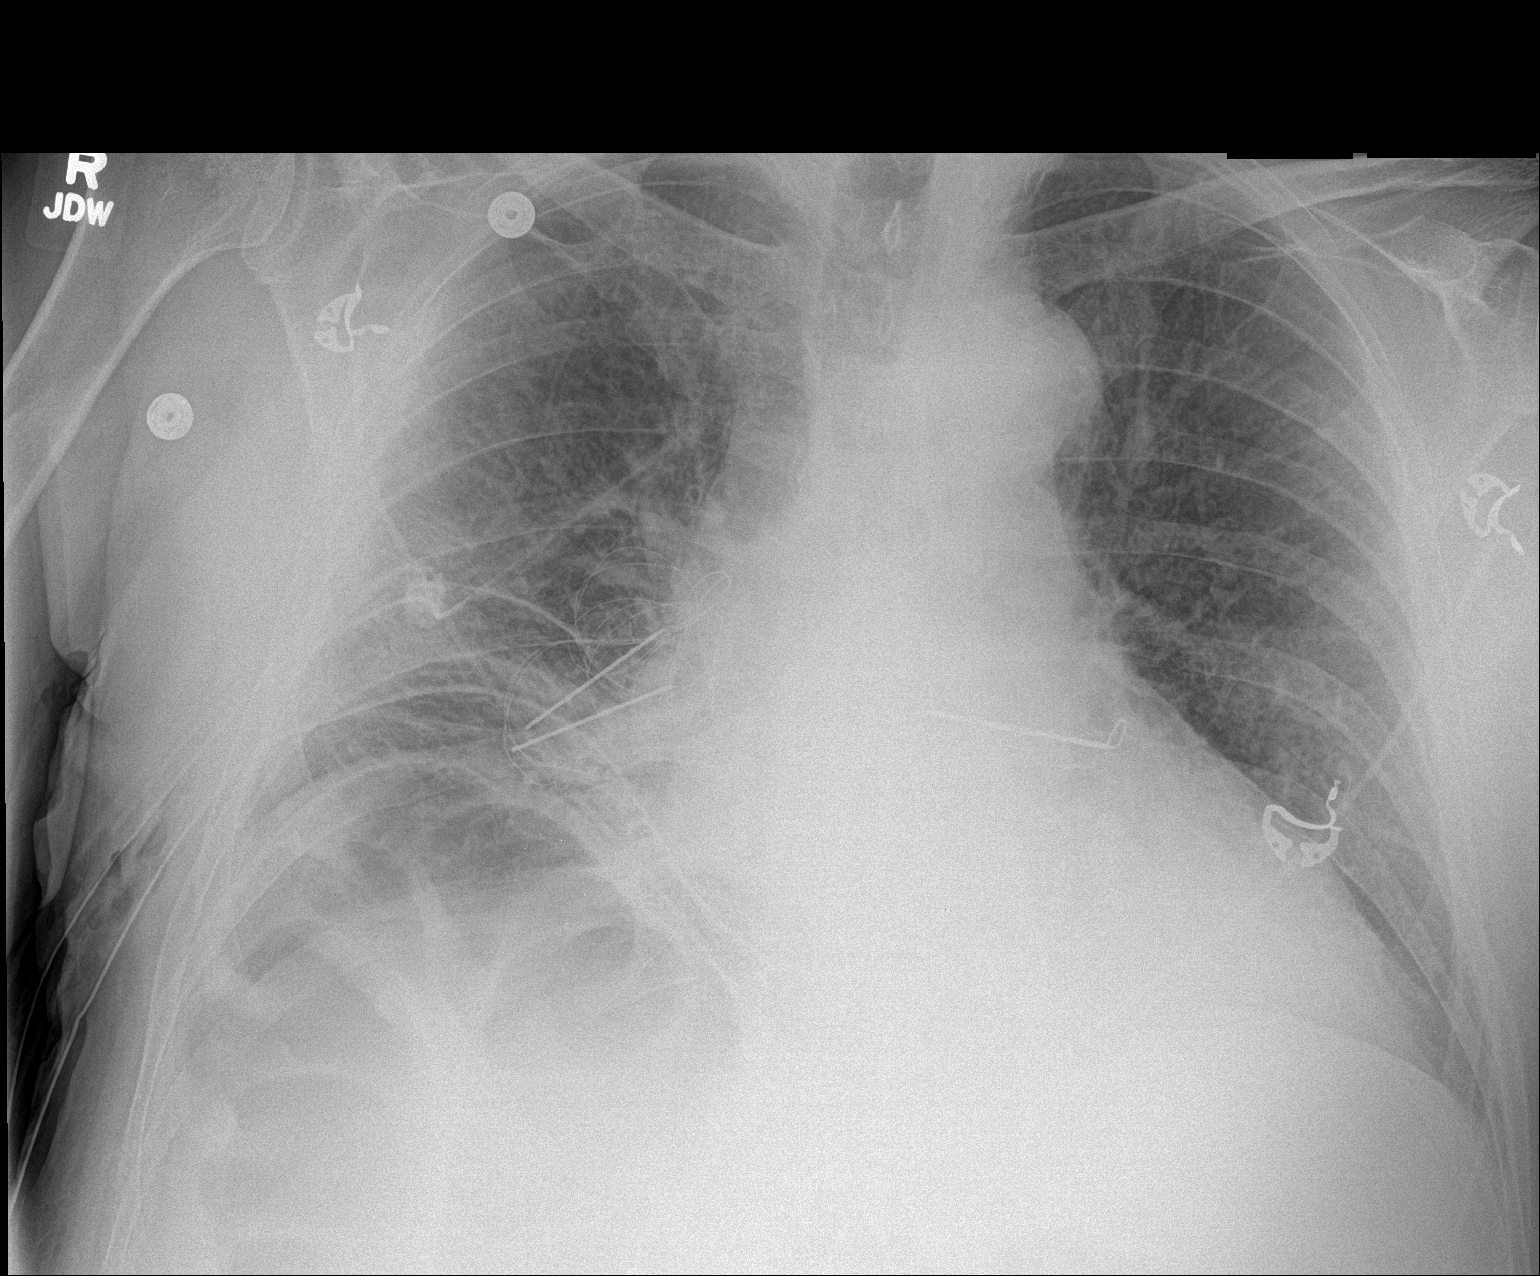

[1 of 1 positions shown; findings below may reference images not displayed]

FINDINGS: Stable enlarged cardiac silhouette. Decreased opacity at both lung
bases. Decreased prominence of the pulmonary vasculature and
interstitial markings. Stable right chest tubes with no
pneumothorax. Unremarkable bones.
IMPRESSION: 1. Decreased bibasilar atelectasis.
2. Stable cardiomegaly with improving pulmonary vascular congestion
and interstitial pulmonary edema.

## 2016-08-28 IMAGING — DX DG CHEST 2V
2 series · 2 of 2 positions shown · non-contrast
Comparison: 10/26/2015.

CLINICAL DATA: Atelectasis.  Cardiac surgery.

EXAM:
CHEST  2 VIEW

[chest pa]
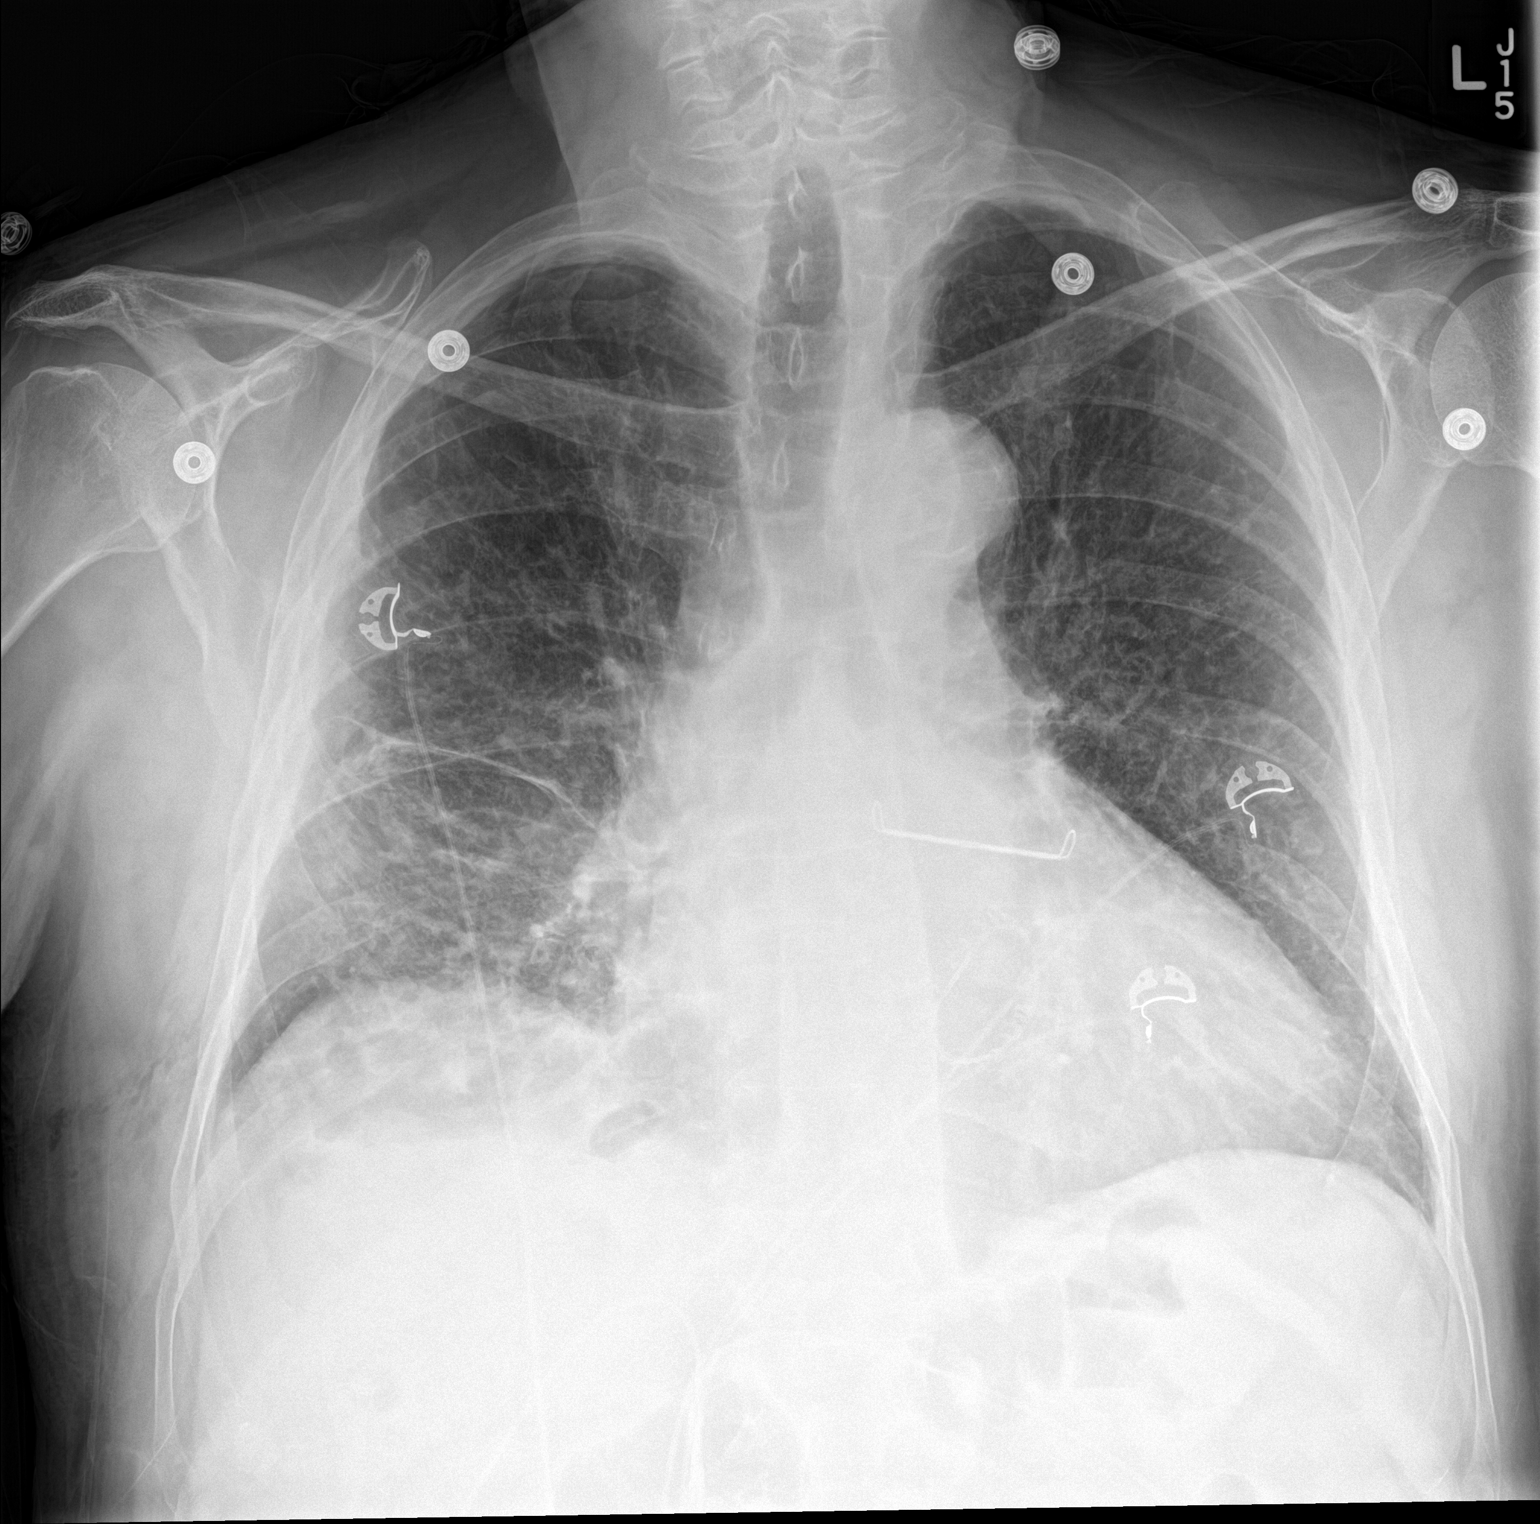

[chest lat]
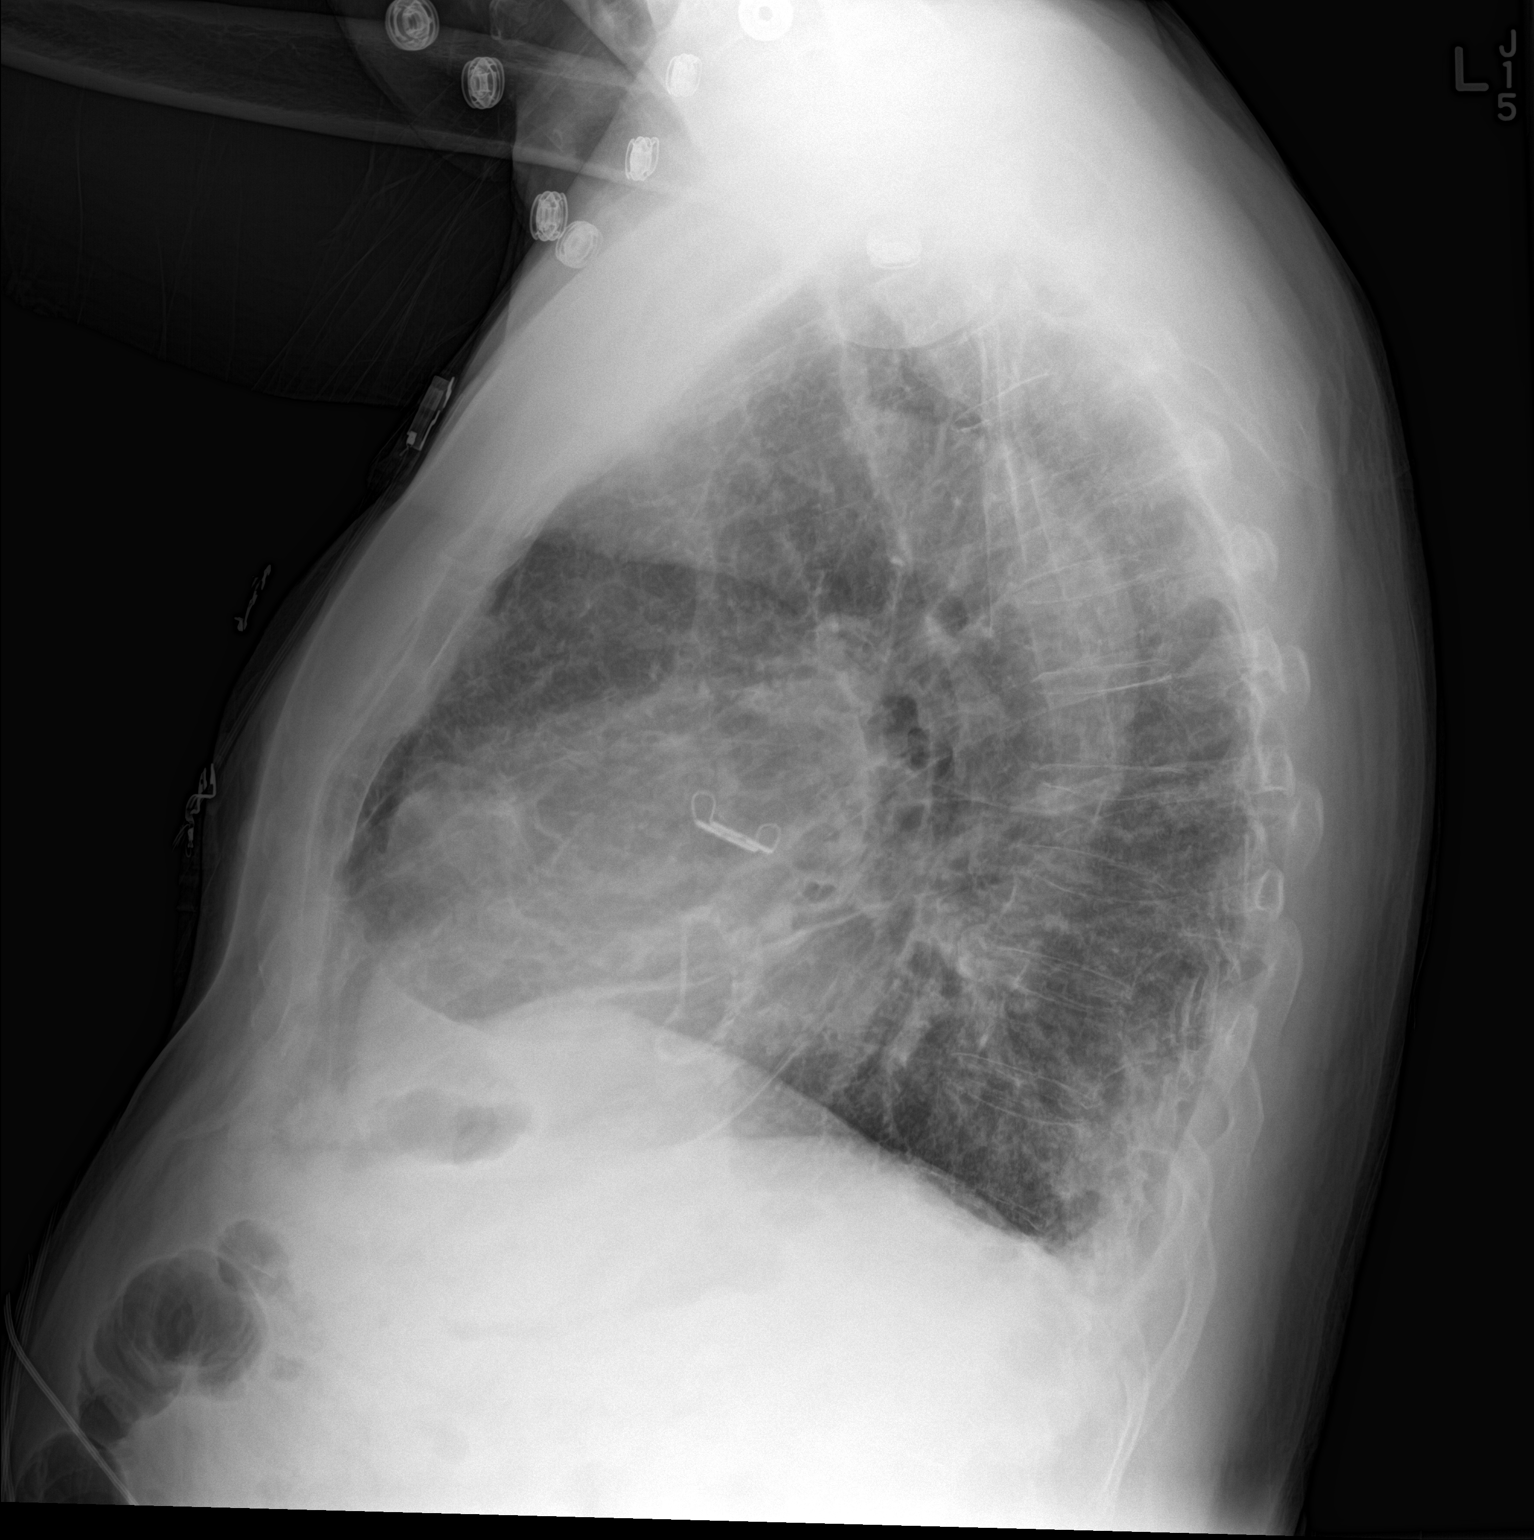

[2 of 2 positions shown; findings below may reference images not displayed]

FINDINGS: Interim removal of right chest tubes. Tiny right apical
pneumothorax. Mediastinum and hilar structures are normal. Prior
cardiac valve replacement. Left atrial appendage clip. Cardiomegaly
with normal pulmonary vascularity. Low lung volumes with mild right
base subsegmental atelectasis. No significant pleural effusion. Mild
right chest wall subcutaneous emphysema .
IMPRESSION: 1. Interim removal of right chest tubes. Tiny right apical
pneumothorax noted.
2. Cardiac valve replacement. Left atrial appendage clip noted.
Stable cardiomegaly. No pulmonary venous congestion.
3. Low lung volumes with mild right base atelectasis.
4. Mild right chest wall subcutaneous emphysema. Critical
Value/emergent results were called by telephone at the time of
interpretation on 10/29/2015 at [DATE] to nurse [REDACTED], who verbally
acknowledged these results.

## 2016-09-13 ENCOUNTER — Telehealth: Payer: Self-pay | Admitting: Cardiovascular Disease

## 2016-09-13 NOTE — Telephone Encounter (Signed)
lmov to schedule an appt .   3 attempts to schedule appt from recall.  Deleting recall.

## 2016-10-01 ENCOUNTER — Encounter: Payer: Self-pay | Admitting: Cardiovascular Disease

## 2016-10-01 ENCOUNTER — Ambulatory Visit (INDEPENDENT_AMBULATORY_CARE_PROVIDER_SITE_OTHER): Payer: Medicare HMO | Admitting: Cardiovascular Disease

## 2016-10-01 VITALS — BP 122/80 | HR 62 | Ht 70.0 in | Wt 190.8 lb

## 2016-10-01 DIAGNOSIS — Z9889 Other specified postprocedural states: Secondary | ICD-10-CM

## 2016-10-01 DIAGNOSIS — I2510 Atherosclerotic heart disease of native coronary artery without angina pectoris: Secondary | ICD-10-CM | POA: Diagnosis not present

## 2016-10-01 DIAGNOSIS — F32A Depression, unspecified: Secondary | ICD-10-CM

## 2016-10-01 DIAGNOSIS — F418 Other specified anxiety disorders: Secondary | ICD-10-CM

## 2016-10-01 DIAGNOSIS — F329 Major depressive disorder, single episode, unspecified: Secondary | ICD-10-CM

## 2016-10-01 DIAGNOSIS — R300 Dysuria: Secondary | ICD-10-CM

## 2016-10-01 DIAGNOSIS — I480 Paroxysmal atrial fibrillation: Secondary | ICD-10-CM | POA: Diagnosis not present

## 2016-10-01 DIAGNOSIS — F419 Anxiety disorder, unspecified: Secondary | ICD-10-CM

## 2016-10-01 DIAGNOSIS — I5032 Chronic diastolic (congestive) heart failure: Secondary | ICD-10-CM | POA: Diagnosis not present

## 2016-10-01 DIAGNOSIS — E7800 Pure hypercholesterolemia, unspecified: Secondary | ICD-10-CM

## 2016-10-01 DIAGNOSIS — J432 Centrilobular emphysema: Secondary | ICD-10-CM

## 2016-10-01 NOTE — Patient Instructions (Addendum)

## 2016-10-01 NOTE — Progress Notes (Signed)
Cardiology Office Note  Date:  10/01/2016   ID:  Michael Delacruz, DOB [birth year], MRN [id number]  PCP:  No PCP Per Patient   Chief Complaint  Patient presents with  . other    3 month follow up. Meds reviewed by the pt. verbally. "doing well."     HPI:  Michael Delacruz is a 75 year old gentleman with a reported history of rheumatic fever as a child in his late teenage years, a murmur for several decades, long smoking history for 50 years with underlying COPD, with worsening shortness of breath when he was first seen in clinic, severe mitral valve regurgitation on echocardiogram, prolapse of posterior leaflet, moderate pulmonary hypertension on initial echocardiogram who presents for Routine followup of his severe MR, s/p successful MR repair by right thoracotomy by Dr. Roxy Manns November 2016 [place] 1 ppd  In follow-up, he reports that he is doing well, some stress at home. Living in a trailer, some financial issues Currently not working. Has passed up on some part-time jobs  Recent episodes of left chest pain me sharp, lasting for many hours at a time, some pain on palpation of his left breast. Denies any heavy lifting. Denies any significant shortness of breath on exertion, no leg swelling, weight gain or edema  Continued vision problems, right eye blurriness, stable  EKG on today's visit shows no sinus rhythm with rate 62 bpm, diffuse  ST and T wave abnormality V5 through V6, 1 and aVL  Other past medical history reviewed History of bleeding in his right eye  On warfarin, INR was greater than 5 Seen by ophthalmology, symptoms  Improved back to his baseline  started on aspirin and Plavix, had neurologic event with weakness in his hand  Was told in the emergency room he was in atrial fibrillation ( review EKG actually showed normal sinus rhythm)  Warfarin was restarted for TIA symptoms  Had recurrent  High bleedingsymptoms when he restarted warfarin   he started eliquis  For stroke  prevention Was taking eliquis  2.5 mill grams twice a day for 2 days then went up to 5 mg daily  Over the phone we had suggested go to 5 mg twice a day   Hospitalizationt [facility] at [facility] for paroxysmal atrial fibrillation. After rate control, he had TEE cardioversion and was discharged on diltiazem, and coagulation.  Previously had bradycardia on metoprolol was taking 12.5 mg in the morning  previousdramatic improvement in his weight and diet with drop of his cholesterol from 240 to 170. Cholesterol now back up to 260. He refused cholesterol pill  Repeat echocardiogram 2013 shows mildly dilated left ventricle at end systole, left ventricular size is less than 4 cm in diastole,   normal LV function estimated at >60%, moderate valve prolapse of the posterior leaflet with moderate to severe mitral valve regurgitation that is the centric and directed towards the septum, right ventricular systolic pressure estimated at 50 mm mercury  He had a cardiac catheterization  that showed severe mitral valve regurgitation, 30% mid and 30% proximal LAD disease, 50% mid circumflex disease ejection fraction 50%.  PMH:   has a past medical history of Arthritis; Asthma; Bradycardia; CAD in native artery; Chronic diastolic congestive heart failure (HCC); COPD (chronic obstructive pulmonary disease) ([place]); Dilation of intestine (01/2015); Fibromyalgia; GERD (gastroesophageal reflux disease); History of blood clots; History of hiatal hernia; History of rheumatic fever; Hypertension; Kidney stone; PAF (paroxysmal atrial fibrillation) ([place]); S/P Minimally invasive maze operation for atrial fibrillation (10/22/2015); S/P minimally  invasive mitral valve repair (10/22/2015); Severe mitral regurgitation; Stroke [facility]); TIA (transient ischemic attack); and Tobacco abuse.  PSH:    Past Surgical History:  Procedure Laterality Date  . ANKLE SURGERY    . CARDIAC CATHETERIZATION N/A 10/03/2015   Procedure: Right and Left Heart  Cath and Coronary Angiography;  Surgeon: [name], MD;  Location: [facility];  Service: Cardiovascular;  Laterality: N/A;  . COLONOSCOPY    . ELECTROPHYSIOLOGIC STUDY N/A 08/18/2015   Procedure: CARDIOVERSION;  Surgeon: Wellington Hampshire, MD;  Location: [facility];  Service: Cardiovascular;  Laterality: N/A;  . MINIMALLY INVASIVE MAZE PROCEDURE N/A 10/22/2015   Procedure: MINIMALLY INVASIVE MAZE PROCEDURE;  Surgeon: Rexene Alberts, MD;  Location: [facility];  Service: Open Heart Surgery;  Laterality: N/A;  . MITRAL VALVE REPAIR Right 10/22/2015   Procedure: MINIMALLY INVASIVE MITRAL VALVE REPAIR (MVR);  Surgeon: Rexene Alberts, MD;  Location: [facility];  Service: Open Heart Surgery;  Laterality: Right;  . SINUS EXPLORATION    . TEE WITHOUT CARDIOVERSION N/A 08/18/2015   Procedure: TRANSESOPHAGEAL ECHOCARDIOGRAM (TEE);  Surgeon: Wellington Hampshire, MD;  Location: [facility];  Service: Cardiovascular;  Laterality: N/A;  . TEE WITHOUT CARDIOVERSION N/A 10/22/2015   Procedure: TRANSESOPHAGEAL ECHOCARDIOGRAM (TEE);  Surgeon: Rexene Alberts, MD;  Location: [facility];  Service: Open Heart Surgery;  Laterality: N/A;    Current Outpatient Prescriptions  Medication Sig Dispense Refill  . ALPRAZolam (XANAX) 0.25 MG tablet one tab by mouth in am and 1/2 tab in pm    . aspirin EC 81 MG tablet Take 1 tablet (81 mg total) by mouth daily.     No current facility-administered medications for this visit.      Allergies:   Codeine; Macrodantin [nitrofurantoin macrocrystal]; and Morphine and related   Social History:  The patient  reports that he has been smoking Cigarettes.  He has a 13.00 pack-year smoking history. He has never used smokeless tobacco. He reports that he does not drink alcohol or use drugs.   Family History:   family history includes Hypertension in his other.    Review of Systems: Review of Systems  Constitutional: Negative.   Respiratory: Negative.   Cardiovascular: Negative.         Left chest pain  Gastrointestinal: Negative.   Genitourinary: Positive for dysuria.  Musculoskeletal: Negative.   Neurological: Negative.   Psychiatric/Behavioral: Negative.   All other systems reviewed and are negative.    PHYSICAL EXAM: VS:  BP 122/80 (BP Location: Left Arm, Patient Position: Sitting, Cuff Size: Normal)   Pulse 62   Ht 5\' 10"  (1.778 m)   Wt 190 lb 12 oz (86.5 kg)   BMI 27.37 kg/m  , BMI Body mass index is 27.37 kg/m. GEN: Well nourished, well developed, in no acute distress  HEENT: normal  Neck: no JVD, carotid bruits, or masses Cardiac: RRR; no murmurs, rubs, or gallops,no edema  Respiratory:  clear to auscultation bilaterally, normal work of breathing GI: soft, nontender, nondistended, + BS MS: no deformity or atrophy  Skin: warm and dry, no rash Neuro:  Strength and sensation are intact Psych: euthymic mood, full affect    Recent Labs: 10/23/2015: Magnesium 2.4 12/05/2015: ALT 10 12/06/2015: TSH 3.162 01/01/2016: B Natriuretic Peptide 71.5; BUN <5; Creatinine, Ser 1.03; Hemoglobin 13.3; Platelets 169; Potassium 3.9; Sodium 142    Lipid Panel Lab Results  Component Value Date   CHOL 169 12/06/2015   HDL 36 (L) 12/06/2015  LDLCALC 116 (H) 12/06/2015   TRIG 85 12/06/2015      Wt Readings from Last 3 Encounters:  10/01/16 190 lb 12 oz (86.5 kg)  02/02/16 199 lb (90.3 kg)  01/12/16 199 lb (90.3 kg)       ASSESSMENT AND PLAN:  Paroxysmal atrial fibrillation (HCC) - Plan: EKG 12-Lead Maintaining normal sinus rhythm. Currently not on anticoagulation  Coronary artery disease involving native coronary artery of native heart without angina pectoris - Plan: EKG 12-Lead Currently with no symptoms of angina. No further workup at this time. He does not want a cholesterol medication  Chronic diastolic congestive heart failure ([place]) - Plan: EKG 12-Lead Appears euvolemic on today's visit, no further testing  Pure hypercholesterolemia Discussed  his high cholesterol with him. He will work on diet, exercise  S/P MVR (mitral valve repair) Doing well following his surgery, on aspirin No further testing at this time  Centrilobular emphysema ([place]) We have encouraged him to continue to work on weaning his cigarettes and smoking cessation. He will continue to work on this and does not want any assistance with chantix.   Depression Living in a trailer, financial issues Recommended a walking program, part-time job  Dysuria He reports having chronic urinary tract infection previously treated by Dr. Hall Busing, requesting antibiotic. We tried to call office of Dr. Hall Busing to obtain previous UA and culture results for management. They are closed. He will call Dr. Hall Busing on Monday   Total encounter time more than 25 minutes  Greater than 50% was spent in counseling and coordination of care with the patient  Disposition:   F/U  12 months   Orders Placed This Encounter  Procedures  . EKG 12-Lead     Signed, Esmond Plants, M.D., Ph.D. 10/01/2016  [place], [place]

## 2016-10-31 IMAGING — DX DG CHEST 2V
2 series · 2 of 2 positions shown · non-contrast
Comparison: Radiographs 11/10/2015 and 10/29/2015.

CLINICAL DATA: Chest pain today. History of COPD and mitral valve
surgery.

EXAM:
CHEST  2 VIEW

[w chest pa]
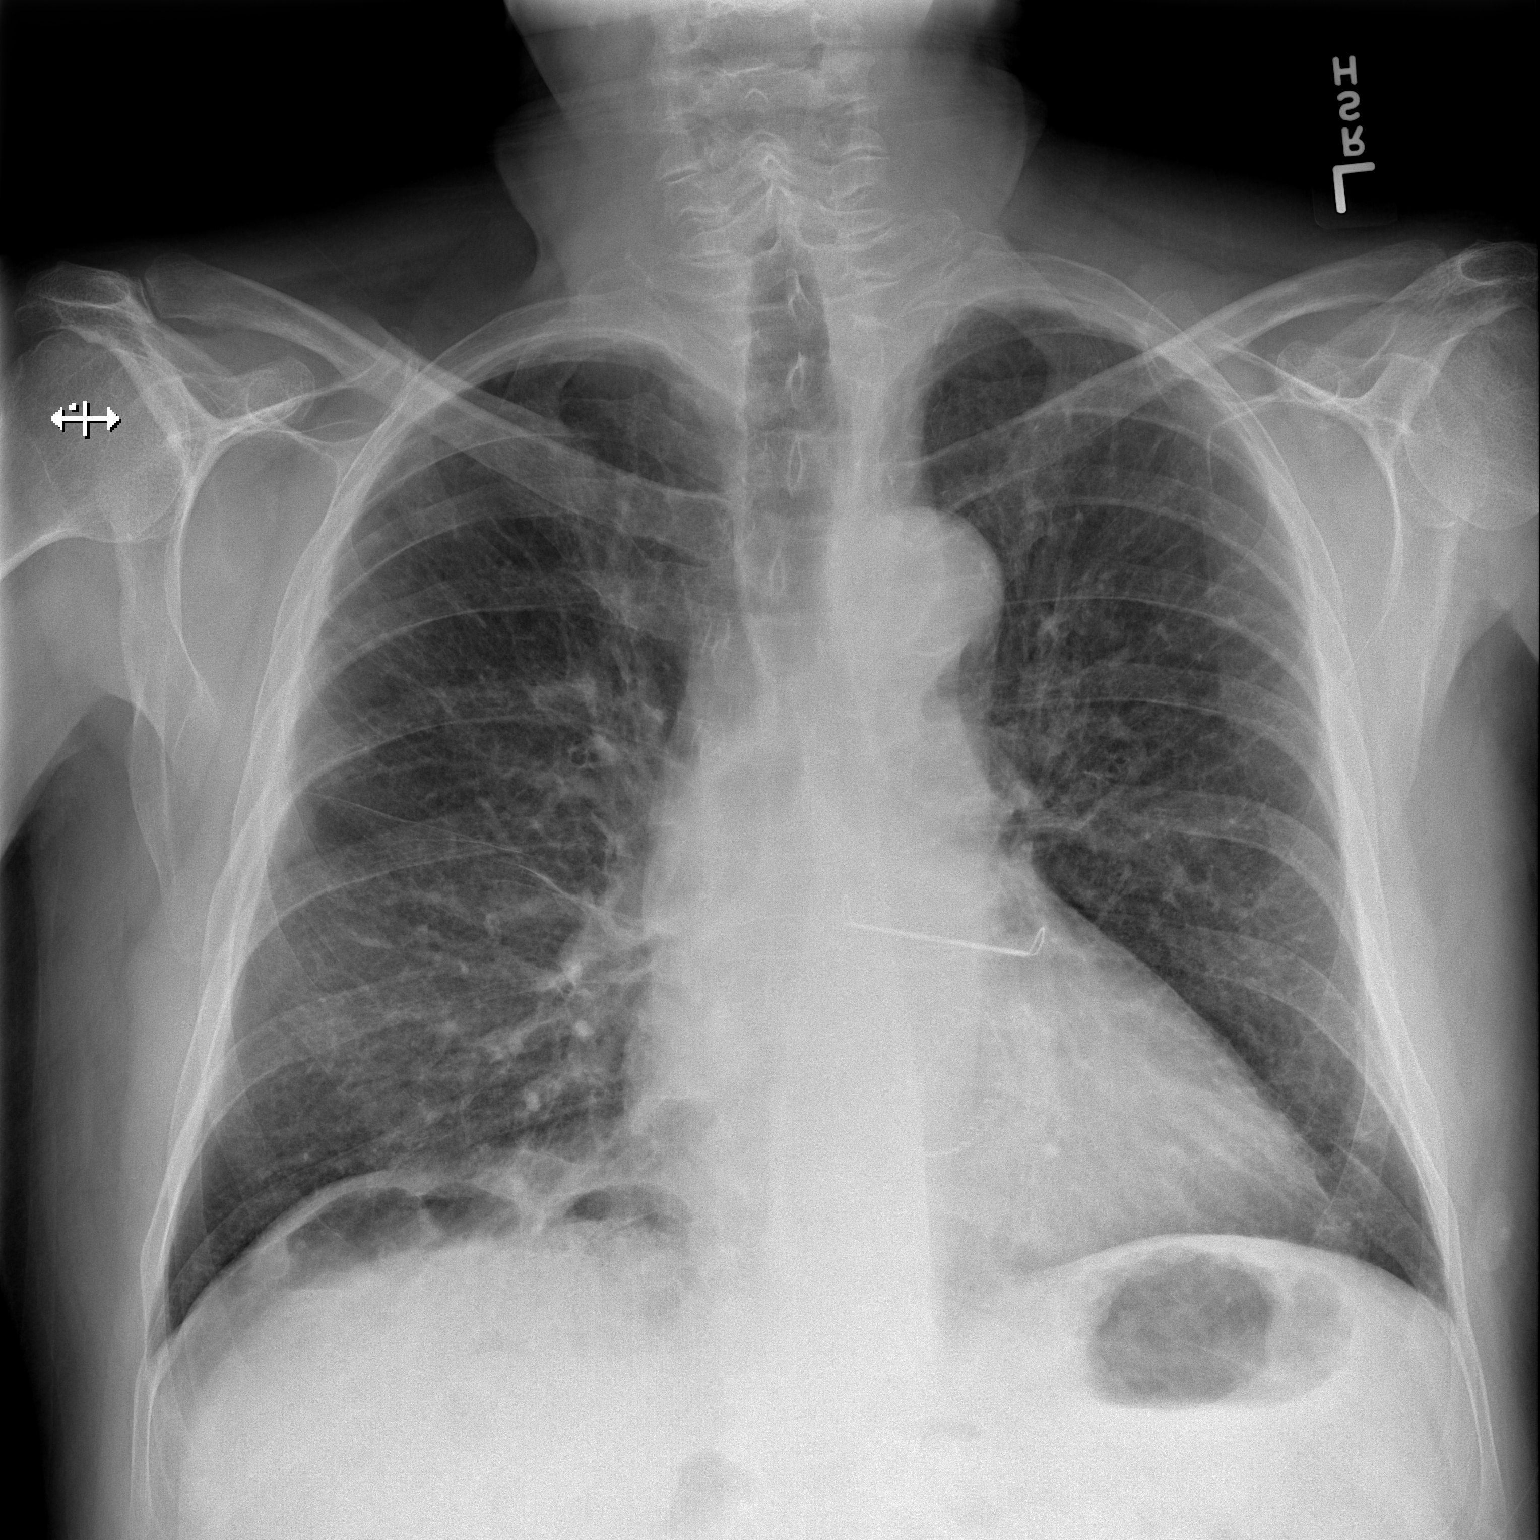

[w chest lat]
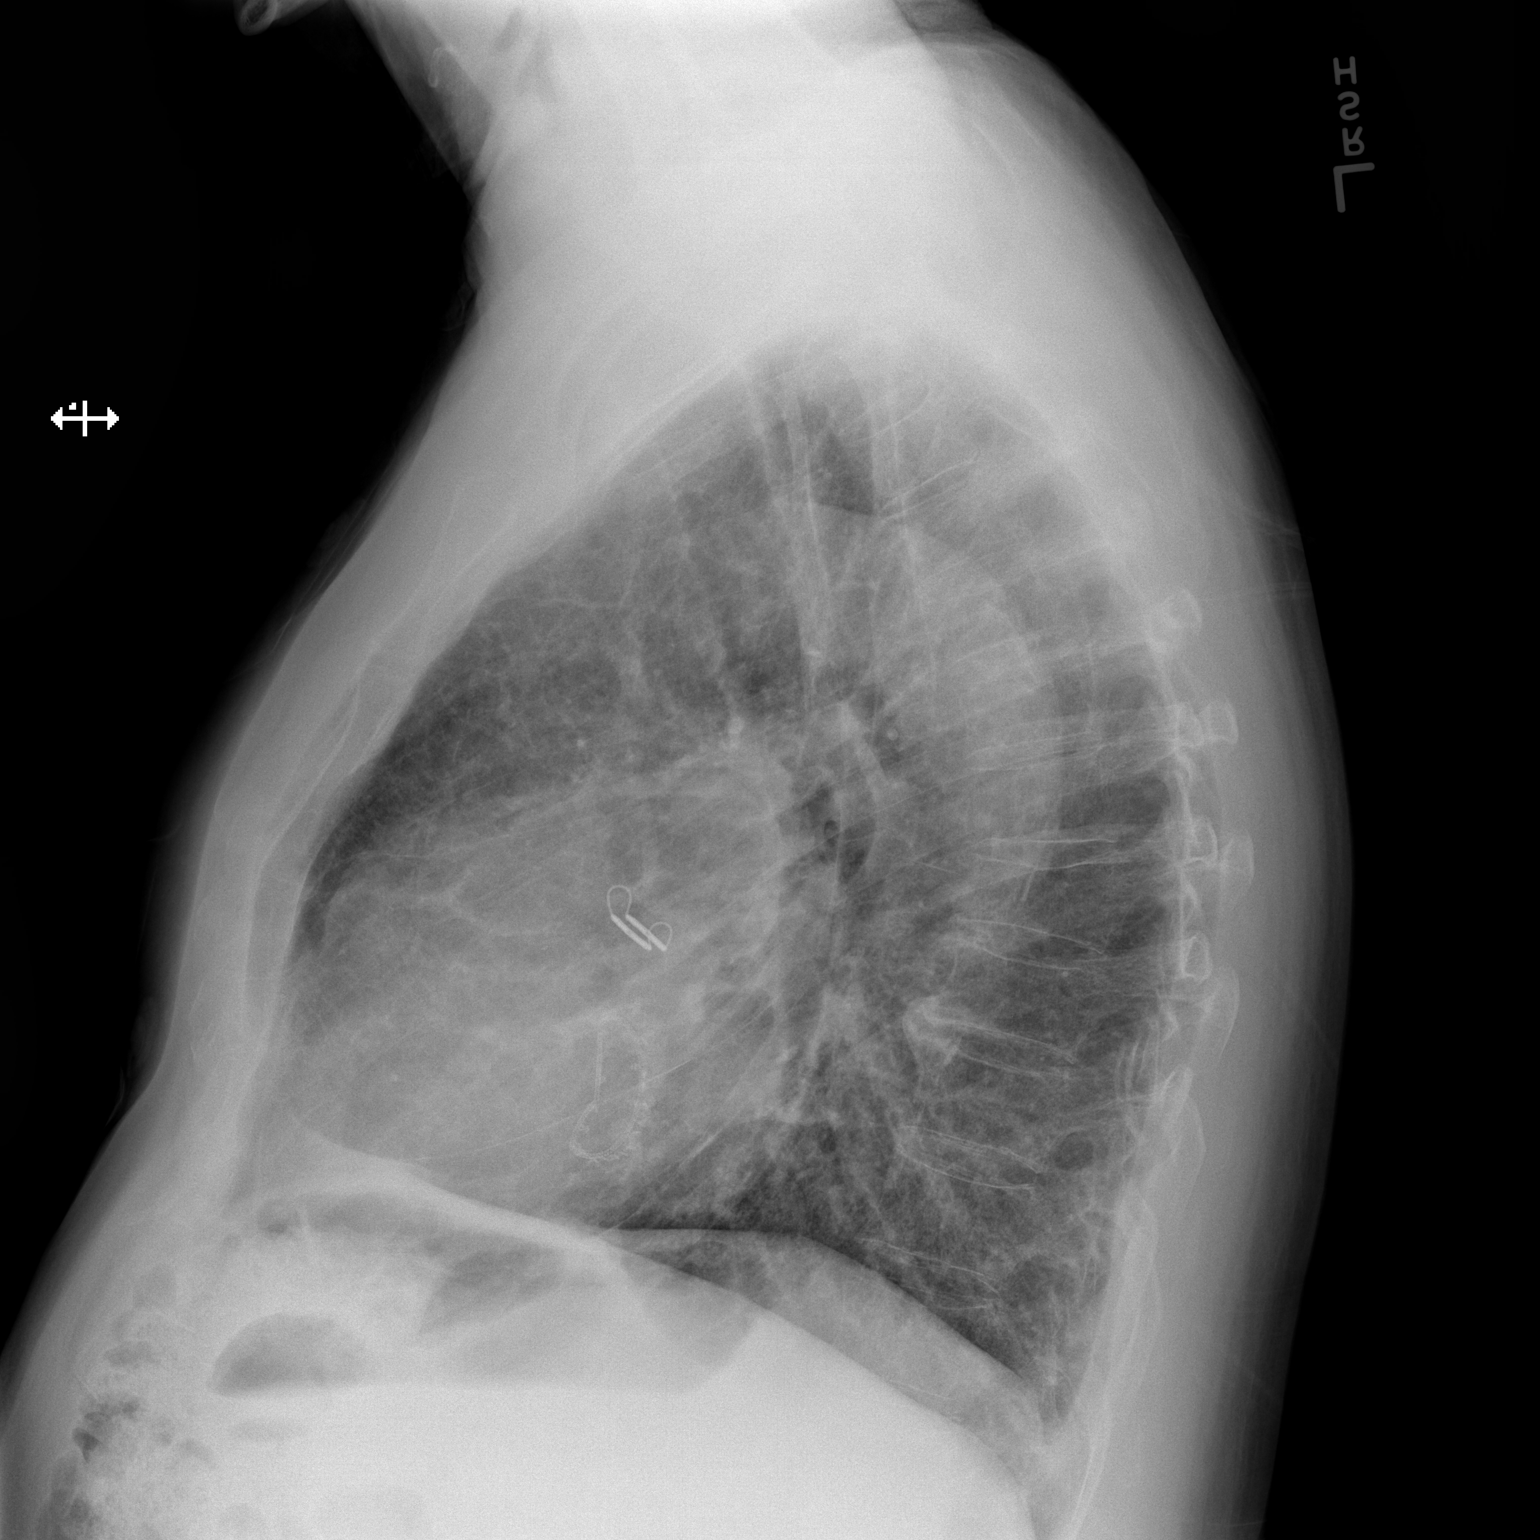

[2 of 2 positions shown; findings below may reference images not displayed]

FINDINGS: The heart size and mediastinal contours are stable status post left
atrial appendage clamping and mitral valve replacement. There is
mild aortic atherosclerosis. There is interval improved aeration of
the lung bases with no residual pleural effusion. There is mild
chronic lung disease with basilar scarring and chronic vascular
congestion. No pneumothorax. The bones appear unchanged with a lower
thoracic compression deformity.
IMPRESSION: Interval improved aeration of the lung bases. No acute
cardiopulmonary process.

## 2016-11-01 ENCOUNTER — Ambulatory Visit (INDEPENDENT_AMBULATORY_CARE_PROVIDER_SITE_OTHER): Payer: Medicare HMO | Admitting: Thoracic Surgery (Cardiothoracic Vascular Surgery)

## 2016-11-01 ENCOUNTER — Encounter: Payer: Self-pay | Admitting: Thoracic Surgery (Cardiothoracic Vascular Surgery)

## 2016-11-01 VITALS — BP 127/78 | HR 60 | Resp 18 | Ht 73.0 in | Wt 195.0 lb

## 2016-11-01 DIAGNOSIS — Z9889 Other specified postprocedural states: Secondary | ICD-10-CM

## 2016-11-01 DIAGNOSIS — Z8679 Personal history of other diseases of the circulatory system: Secondary | ICD-10-CM | POA: Diagnosis not present

## 2016-11-01 NOTE — Patient Instructions (Addendum)
Continue all previous medications without any changes at this time  Stop smoking immediately and permanently.  Endocarditis is a potentially serious infection of heart valves or inside lining of the heart.  It occurs more commonly in patients with diseased heart valves (such as patient's with aortic or mitral valve disease) and in patients who have undergone heart valve repair or replacement.  Certain surgical and dental procedures may put you at risk, such as dental cleaning, other dental procedures, or any surgery involving the respiratory, urinary, gastrointestinal tract, gallbladder or prostate gland.   To minimize your chances for develooping endocarditis, maintain good oral health and seek prompt medical attention for any infections involving the mouth, teeth, gums, skin or urinary tract.    Always notify your doctor or dentist about your underlying heart valve condition before having any invasive procedures. You will need to take antibiotics before certain procedures, including all routine dental cleanings or other dental procedures.  Your cardiologist or dentist should prescribe these antibiotics for you to be taken ahead of time.      

## 2016-11-01 NOTE — Progress Notes (Signed)
[address]       [place] [address]             [phone]     CARDIOTHORACIC SURGERY OFFICE NOTE  Referring Provider is [name], MD PCP is No PCP Per Patient   HPI:  Patient is a 75 year old male with multiple medical problems who returns to the office today for routine follow-up approximately one year status post minimally invasive mitral valve repair and Maze procedure on 10/22/2015 for severe symptomatic primary mitral regurgitation and recurrent persistent atrial fibrillation.  His early postoperative recovery was uneventful. He was initially anticoagulated using warfarin but this was stopped due to blurry vision attributed to increased intraocular pressure. In December 2016 the patient suffered a small nonhemorrhagic cortical stroke confirmed on MRI. Echocardiogram performed at that time revealed normal left ventricular systolic function with intact mitral valve repair and trivial residual mitral regurgitation. He was initially anticoagulated again using warfarin but later switched to Eliquis.  He was last seen in our office on 02/02/2016 at which time he was maintaining sinus rhythm but no longer taking any sort of anticoagulation, not even aspirin.  Since then he has been agreeable with taking low-dose aspirin.  He was seen in follow-up recently by Dr. [name] on 10/01/2016 at which time he was maintaining sinus rhythm and overall doing fairly well. He returns to our office for routine follow-up today.  He states that overall he feels much improved in comparison with how he felt prior to his surgery one year ago. He notes that his breathing in exercise tolerance or much improved. He still gets short of breath with physical exertion, but this occurs only with more strenuous activities and does not limit his day-to-day activities to any significant degree. He denies any history of exertional chest pain or chest tightness. He states that he still occasionally  gets occasional brief "jolts" of pain across his left chest that seemed to come and go sporadically and are not related to physical activity. He has not had any palpitations or dizzy spells. He continues to smoke cigarettes.   Current Outpatient Prescriptions  Medication Sig Dispense Refill  . ALPRAZolam (XANAX) 0.25 MG tablet one tab by mouth in am and 1/2 tab in pm    . aspirin EC 81 MG tablet Take 1 tablet (81 mg total) by mouth daily.     No current facility-administered medications for this visit.       Physical Exam:   BP 127/78 (BP Location: Right Arm, Patient Position: Sitting, Cuff Size: Normal)   Pulse 60   Resp 18   Ht 6\' 1"  (1.854 m)   Wt 195 lb (88.5 kg)   SpO2 96%   BMI 25.73 kg/m   General:  Well-appearing  Chest:   Clear to auscultation  CV:   Regular rate and rhythm without murmur  Incisions:  Completely healed  Abdomen:  soft  Extremities:  Warm, no edema  Diagnostic Tests:  2 channel telemetry rhythm strip demonstrates normal sinus rhythm   Transthoracic Echocardiography  Patient:    Michael Delacruz, Michael Delacruz MR #:       [id number] Study Date: 11/26/2015 Gender:     M Age:        82 Height:     185.4 cm Weight:     87.5 kg BSA:        2.13 m^2 Pt. Status: Room:   SONOGRAPHER  [facility]  ATTENDING  Gollan, Tim  ORDERING     Gollan, Tim  REFERRING    [place], Tim  PERFORMING   Obetz, Calvin  cc:  ------------------------------------------------------------------- LV EF: 55% -   60%  ------------------------------------------------------------------- History:   PMH:   Fatigue and weakness.  Atrial fibrillation. Mitral valve disease.  Stroke.  Chronic obstructive pulmonary disease.  Risk factors:  Current tobacco use. Hypertension. Dyslipidemia.  ------------------------------------------------------------------- Study Conclusions  - Left ventricle: The cavity size was normal. Systolic function was   normal. The estimated  ejection fraction was in the range of 55%   to 60%. Wall motion was normal; there were no regional wall   motion abnormalities. Left ventricular diastolic function   parameters were normal. - Aorta: Aortic root dimension: 44 mm (ED). - Aortic root: The aortic root was moderately dilated. - Mitral valve: There was mild regurgitation. - Left atrium: The atrium was mildly dilated. - Right ventricle: Systolic function was normal. - Pulmonary arteries: Systolic pressure was within the normal   range.  ------------------------------------------------------------------- Labs, prior tests, procedures, and surgery: Echocardiography (August 2016).     EF was 65%.  Catheterization (November 2016).    Mitral valvuloplasty was performed.  EF was 45%. s/p MAZE procedure. Transthoracic echocardiography.  M-mode, complete 2D, spectral Doppler, and color Doppler.  Birthdate:  Patient birthdate: 02/12/1941.  Age:  Patient is 75 yr old.  Sex:  Gender: male. BMI: 25.5 kg/m^2.  Blood pressure:     100/62  Patient status: Outpatient.  Study date:  Study date: 11/26/2015. Study time: 10:32 AM.  -------------------------------------------------------------------  ------------------------------------------------------------------- Left ventricle:  The cavity size was normal. Systolic function was normal. The estimated ejection fraction was in the range of 55% to 60%. Wall motion was normal; there were no regional wall motion abnormalities. The transmitral flow pattern was normal. The deceleration time of the early transmitral flow velocity was normal. The pulmonary vein flow pattern was normal. The tissue Doppler parameters were normal. Left ventricular diastolic function parameters were normal.  ------------------------------------------------------------------- Aortic valve:   Trileaflet; normal thickness, mildly calcified leaflets. Mobility was not restricted.  Doppler:  Transvalvular velocity  was within the normal range. There was no stenosis. There was no regurgitation.  ------------------------------------------------------------------- Aorta:  Aortic root: The aortic root was moderately dilated.  ------------------------------------------------------------------- Mitral valve:  S/p mitral valvuloplasty, normal leaflets. Mobility was not restricted.  Doppler:  Transvalvular velocity was within the normal range. There was no evidence for stenosis. There was mild regurgitation.    Peak gradient (D): 9 mm Hg.  ------------------------------------------------------------------- Left atrium:  The atrium was mildly dilated.  ------------------------------------------------------------------- Right ventricle:  The cavity size was normal. Wall thickness was normal. Systolic function was normal.  ------------------------------------------------------------------- Pulmonic valve:    Structurally normal valve.   Cusp separation was normal.  Doppler:  Transvalvular velocity was within the normal range. There was no evidence for stenosis. There was trivial regurgitation.  ------------------------------------------------------------------- Tricuspid valve:   Structurally normal valve.    Doppler: Transvalvular velocity was within the normal range. There was mild regurgitation.  ------------------------------------------------------------------- Pulmonary artery:   The main pulmonary artery was normal-sized. Systolic pressure was within the normal range.  ------------------------------------------------------------------- Right atrium:  The atrium was normal in size.  ------------------------------------------------------------------- Pericardium:  There was no pericardial effusion.  ------------------------------------------------------------------- Systemic veins: Inferior vena cava: The vessel was normal in  size.  ------------------------------------------------------------------- Measurements   Left ventricle  Value        Reference  LV ID, ED, PLAX chordal             (L)     41.8  mm     43 - 52  LV ID, ES, PLAX chordal             (L)     20.8  mm     23 - 38  LV fx shortening, PLAX chordal              50    %      >=29  LV PW thickness, ED                         11.8  mm     ---------  IVS/LV PW ratio, ED                         1.2          <=1.3  Stroke volume, 2D                           120   ml     ---------  Stroke volume/bsa, 2D                       56    ml/m^2 ---------  LV e&', lateral                              7.7   cm/s   ---------  LV E/e&', lateral                            18.96        ---------  LV e&', medial                               7.31  cm/s   ---------  LV E/e&', medial                             19.97        ---------  LV e&', average                              7.51  cm/s   ---------  LV E/e&', average                            19.45        ---------    Ventricular septum                          Value        Reference  IVS thickness, ED                           14.2  mm     ---------    LVOT  Value        Reference  LVOT ID, S                                  27    mm     ---------  LVOT area                                   5.73  cm^2   ---------  LVOT ID                                     27    mm     ---------  LVOT peak velocity, S                       104   cm/s   ---------  LVOT mean velocity, S                       68.1  cm/s   ---------  LVOT VTI, S                                 21    cm     ---------  LVOT peak gradient, S                       4     mm Hg  ---------  Stroke volume (SV), LVOT DP                 120.2 ml     ---------  Stroke index (SV/bsa), LVOT DP              56.4  ml/m^2 ---------    Aorta                                       Value         Reference  Aortic root ID, ED                          44    mm     ---------    Left atrium                                 Value        Reference  LA ID, A-P, ES                              42    mm     ---------  LA ID/bsa, A-P                              1.97  cm/m^2 <=2.2  LA volume, S  74    ml     ---------  LA volume/bsa, S                            34.7  ml/m^2 ---------  LA volume, ES, 1-p A4C                      64    ml     ---------  LA volume/bsa, ES, 1-p A4C                  30    ml/m^2 ---------  LA volume, ES, 1-p A2C                      85    ml     ---------  LA volume/bsa, ES, 1-p A2C                  39.9  ml/m^2 ---------    Mitral valve                                Value        Reference  Mitral E-wave peak velocity                 146   cm/s   ---------  Mitral A-wave peak velocity                 52.4  cm/s   ---------  Mitral deceleration time            (H)     356   ms     150 - 230  Mitral peak gradient, D                     9     mm Hg  ---------  Mitral E/A ratio, peak                      2            ---------  Mitral maximal regurg velocity,             353   cm/s   ---------  PISA    Pulmonary arteries                          Value        Reference  PA pressure, S, DP                          14    mm Hg  <=30    Tricuspid valve                             Value        Reference  Tricuspid regurg peak velocity              166   cm/s   ---------  Tricuspid peak RV-RA gradient               11    mm Hg  ---------    Systemic veins  Value        Reference  Estimated CVP                               3     mm Hg  ---------    Right ventricle                             Value        Reference  RV pressure, S, DP                          14    mm Hg  <=30  RV s&', lateral, S                           9.5   cm/s   ---------  Legend: (L)  and  (H)  mark values outside specified  reference range.  ------------------------------------------------------------------- Prepared and Electronically Authenticated by  Esmond Plants, MD, [facility] 2016-12-07T18:37:39   Impression:  Patient is doing well approximately one year status post minimally invasive mitral valve repair and Maze procedure. He has been maintaining sinus rhythm. He reports stable symptoms of exertional shortness of breath that occur only with more strenuous physical exertion that may be primarily secondary to COPD but also indicative of stable mild symptoms of chronic diastolic congestive heart failure, [place] Heart Association functional class 1-2.  Unfortunately he continues to smoke cigarettes. He refuses any long-term anticoagulation other than taking low-dose aspirin.  His last follow-up echocardiogram performed in December 2016 looked good with normal left ventricular systolic function and intact mitral valve repair with no significant residual mitral regurgitation. The patient does have mildly dilated aortic root which is being followed using serial echocardiograms by Dr. [name].  Plan:  We have not recommended any changes to the patient's current medications. The relative risks and benefits of long-term anticoagulation because of the patient's history of atrial fibrillation has been again discussed at length. We will refer the patient to the atrial fibrillation clinic for long-term surveillance and follow-up status post Maze procedure. He understands that over time and there remains some risk of recurrence of atrial fibrillation or atrial flutter. We also discussed how important it would be for him to find a way to quit smoking. Many benefits of smoking cessation have been emphasized at length.  Finally, the patient has been reminded regarding the importance of dental hygiene and the lifelong need for antibiotic prophylaxis for all dental cleanings and other related invasive procedures, although the patient is  edentulous.  All of his questions have been addressed.  I spent in excess of 15 minutes during the conduct of this office consultation and >50% of this time involved direct face-to-face encounter with the patient for counseling and/or coordination of their care.   Valentina Gu. Roxy Manns, MD 11/01/2016 10:57 AM

## 2017-07-08 ENCOUNTER — Telehealth: Payer: Self-pay | Admitting: Cardiovascular Disease

## 2017-07-08 NOTE — Telephone Encounter (Signed)
Per verbal from Dr. [name], pt may take 20-40mg  lasix once daily this weekend for fluid retention. Attempted to contact pt.  No answer, no VM on cell. No answer at home number.

## 2017-07-08 NOTE — Telephone Encounter (Signed)
Pt calling stating he is in trouble  He is having stomach pains, never had them before He states he can take air in but he is having issues with talking.  Before surgery he was dx with fluid in chest He is hoping that is what it is now But would like some advise on this Having issues with sleeping, it has been going on a few weeks. Figured it would go away but it has not yet done so  He is having a lot of pain more so in stomach upper rib cage. But also states he's had cp with shoulder pain.

## 2017-07-08 NOTE — Telephone Encounter (Signed)
S/w pt who reports lots of abdominal pain from his navel up to rib cage and breathing issues for the past 3-4 weeks. Abdominal pains improve when he eats 1/2 bowl of grapefruit. He also c/o bilateral shoulder pain, difficulty breathing when talking but he is able to take deep breaths.  States he feels to be "running out of air but not out of breath". Sx worse in AM and improve once he starts moving around. He does not have a pulse ox; no way to check oxygen level.  States it is really difficult to explain what is happening but feels he has fluid in his chest.  Weight fluctuates +/- 3lbs. Weighs a few times a week. Checked VS yesterday  at [facility]:  BP124/89 HR 135 Asked to take pulse manually while on the phone today. Per pt's daughter, HR 63.  Has some chest pain but has had that for over a year. Hx of fibromyalgia and mitral valve repair 2016.  Takes aspirin 81mg  once daily; he discontinued xanax "months ago" States Dr. [name] knows he only calls if something is really wrong and he would like to be seen in clinic. He will continue to monitor s/s and is agreeable to July 23, 11am appt w/Dr. [name]. Will route to MD to make aware and further recommendations if needed.

## 2017-07-09 NOTE — Progress Notes (Signed)
Cardiology Office Note  Date:  07/11/2017   ID:  Michael Delacruz, DOB 1941-10-24, MRN [id number]  PCP:  Patient, No Pcp Per   Chief Complaint  Patient presents with  . other    C/o sob and chest/abd discomfort. Meds reviewed verbally with pt.    HPI:  Michael Delacruz is a 76 year old gentleman with  long smoking history for 50 years with underlying COPD,  severe mitral valve regurgitation on echocardiogram,  prolapse of posterior leaflet,  moderate pulmonary hypertension  s/p successful MR repair by right thoracotomy by Dr. Roxy Manns 11/ 2016 Smokes 1 ppd PAF on warfarin had bleeding, now on asa, s/p Maze Surgical report indicates clipping of left atrial appendage, Atricure left atrial clip, size 45 mm Previous history of retinal bleeding felt exacerbated by warfarin , requiring surgery 2 residual right eye  vision deficits Who presents for follow up of his MR repair and new shortness of breath, tachycardia  In follow-up he reports having severe shortness of breath at least one week, possibly even up to one month. He has not felt well for some time Periodically checks blood pressure and heart rate at [employer], heart rate up to 135 bpm Unable to walk very far without giving out, short of breath Denies any leg swelling, coughing, PND orthopnea Just feels exhausted at home, unable to do his ADLs Feels his stomach is upset to some degree, having headaches.  Does not want blood thinners given history of retinal bleeding  Previously with stress at home  Living in a trailer, some financial issues On his last visit was not working  Denies having significant chest pain Continued vision problems, right eye blurriness, stable. Able to drive  EKG on today's visit shows atrial flutter with rate 133 bpm, rare PVCs  Other past medical history reviewed History of bleeding in his right eye  On warfarin, INR was greater than 5 Seen by ophthalmology, symptoms  Improved back to his baseline  started  on aspirin and Plavix, had neurologic event with weakness in his hand  Was told in the emergency room he was in atrial fibrillation ( review EKG actually showed normal sinus rhythm)  Warfarin was restarted for TIA symptoms  Had recurrent   bleedingsymptoms when he restarted warfarin   he started eliquis  For stroke prevention Was taking eliquis  2.5 mill grams twice a day for 2 days then went up to 5 mg daily  Over the phone we had suggested go to 5 mg twice a day  Hospitalizationt [facility] at [facility] for paroxysmal atrial fibrillation. After rate control, he had TEE cardioversion and was discharged on diltiazem, and coagulation.  Previously had bradycardia on metoprolol was taking 12.5 mg in the morning  Previous dramatic improvement in his weight and diet with drop of his cholesterol from 240 to 170. Cholesterol now back up to 260. He refused cholesterol pill  Repeat echocardiogram 2013 shows mildly dilated left ventricle at end systole, left ventricular size is less than 4 cm in diastole,   normal LV function estimated at >60%, moderate valve prolapse of the posterior leaflet with moderate to severe mitral valve regurgitation that is the centric and directed towards the septum, right ventricular systolic pressure estimated at 50 mm mercury  He had a cardiac catheterization  that showed severe mitral valve regurgitation, 30% mid and 30% proximal LAD disease, 50% mid circumflex disease ejection fraction 50%.  PMH:   has a past medical history of Arthritis; Asthma; Bradycardia; CAD in native  artery; Chronic diastolic congestive heart failure (HCC); COPD (chronic obstructive pulmonary disease) ([place]); Dilation of intestine (01/2015); Fibromyalgia; GERD (gastroesophageal reflux disease); History of blood clots; History of hiatal hernia; History of rheumatic fever; Hypertension; Kidney stone; PAF (paroxysmal atrial fibrillation) ([place]); S/P Minimally invasive maze operation for atrial fibrillation  (10/22/2015); S/P minimally invasive mitral valve repair (10/22/2015); Severe mitral regurgitation; Stroke [facility]); TIA (transient ischemic attack); and Tobacco abuse.  PSH:    Past Surgical History:  Procedure Laterality Date  . ANKLE SURGERY    . CARDIAC CATHETERIZATION N/A 10/03/2015   Procedure: Right and Left Heart Cath and Coronary Angiography;  Surgeon: [name], MD;  Location: [place] [facility];  Service: Cardiovascular;  Laterality: N/A;  . COLONOSCOPY    . ELECTROPHYSIOLOGIC STUDY N/A 08/18/2015   Procedure: CARDIOVERSION;  Surgeon: Wellington Hampshire, MD;  Location: [facility];  Service: Cardiovascular;  Laterality: N/A;  . MINIMALLY INVASIVE MAZE PROCEDURE N/A 10/22/2015   Procedure: MINIMALLY INVASIVE MAZE PROCEDURE;  Surgeon: Rexene Alberts, MD;  Location: [place];  Service: Open Heart Surgery;  Laterality: N/A;  . MITRAL VALVE REPAIR Right 10/22/2015   Procedure: MINIMALLY INVASIVE MITRAL VALVE REPAIR (MVR);  Surgeon: Rexene Alberts, MD;  Location: [place];  Service: Open Heart Surgery;  Laterality: Right;  . SINUS EXPLORATION    . TEE WITHOUT CARDIOVERSION N/A 08/18/2015   Procedure: TRANSESOPHAGEAL ECHOCARDIOGRAM (TEE);  Surgeon: Wellington Hampshire, MD;  Location: [facility];  Service: Cardiovascular;  Laterality: N/A;  . TEE WITHOUT CARDIOVERSION N/A 10/22/2015   Procedure: TRANSESOPHAGEAL ECHOCARDIOGRAM (TEE);  Surgeon: Rexene Alberts, MD;  Location: [place];  Service: Open Heart Surgery;  Laterality: N/A;    Current Outpatient Prescriptions  Medication Sig Dispense Refill  . aspirin EC 81 MG tablet Take 1 tablet (81 mg total) by mouth daily.     No current facility-administered medications for this visit.      Allergies:   Codeine; Macrodantin [nitrofurantoin macrocrystal]; and Morphine and related   Social History:  The patient  reports that he has been smoking Cigarettes.  He has a 13.00 pack-year smoking history. He has never used smokeless tobacco. He reports that he  does not drink alcohol or use drugs.   Family History:   family history includes Hypertension in his other.    Review of Systems: Review of Systems  Constitutional: Negative.   Respiratory: Negative.   Cardiovascular: Negative.        Left chest pain  Gastrointestinal: Negative.   Genitourinary: Positive for dysuria.  Musculoskeletal: Negative.   Neurological: Negative.   Psychiatric/Behavioral: Negative.   All other systems reviewed and are negative.    PHYSICAL EXAM: VS:  BP 108/82 (BP Location: Left Arm, Patient Position: Sitting, Cuff Size: Normal)   Pulse (!) 133   Ht 5\' 10"  (1.778 m)   Wt 199 lb 12 oz (90.6 kg)   BMI 28.66 kg/m  , BMI Body mass index is 28.66 kg/m. GEN: Well nourished, well developed, Mild distress though comfortable  HEENT: normal  Neck: no JVD, carotid bruits, or masses Cardiac:  regular, tachycardic no murmurs, rubs, or gallops,no edema  Respiratory:  clear to auscultation bilaterally, normal work of breathing GI: soft, nontender, nondistended, + BS MS: no deformity or atrophy  Skin: warm and dry, no rash Neuro:  Strength and sensation are intact Psych: euthymic mood, full affect    Recent Labs: No results found for requested labs within last 8760 hours.    Lipid Panel  Lab Results  Component Value Date   CHOL 169 12/06/2015   HDL 36 (L) 12/06/2015   LDLCALC 116 (H) 12/06/2015   TRIG 85 12/06/2015      Wt Readings from Last 3 Encounters:  07/11/17 199 lb 12 oz (90.6 kg)  11/01/16 195 lb (88.5 kg)  10/01/16 190 lb 12 oz (86.5 kg)       ASSESSMENT AND PLAN:  Paroxysmal atrial fibrillation ([place]) - Plan: EKG 12-Lead He is in atrial flutter on today's visit  Atrial flutter New finding, with rapid rate causing symptoms of shortness of breath, general malaise I had started him on digoxin 0.25 mill grams daily with metoprolol 25 mg daily Then Case discussed with Dr. Caryl Comes in detail Options discussed with him   he does have left  atrial appendage clip placed November 2016 Risk of stroke likely reduced following this procedure He has indicated he does not want blood thinners given previous loss of vision, now with mild vision restored allowing him to drive. Given that, we will recommend rate control to start in follow-up with Dr. Caryl Comes Suggested he try digoxin and metoprolol first and if no improvement in heart rate in the next several days, we will add/change to amiodarone for rate control Worse case if unable to control rate we may need to perform TEE cardioversion with several weeks of anticoagulation following the procedure  Coronary artery disease involving native coronary artery of native heart without angina pectoris - Plan: EKG 12-Lead Currently with no symptoms of angina. No further workup at this time. He does not want a cholesterol medication  Chronic diastolic congestive heart failure ([place]) - Plan: EKG 12-Lead Euvolemic, will work on rate control of atrial flutter as above  Pure hypercholesterolemia He does not want cholesterol medication , reports having myalgias in the past   S/P MVR (mitral valve repair) He has been taking aspirin, reluctant to take warfarin or eliqui given history of retinal bleedings   Centrilobular emphysema ([place]) We have encouraged him to continue to work on weaning his cigarettes and smoking cessation. He will continue to work on this and does not want any assistance with chantix.   Depression Living in a trailer, financial issues   Total encounter time more than 45 minutes  Greater than 50% was spent in counseling and coordination of care with the patient  Disposition:   F/U  we'll follow closely given rhythm    No orders of the defined types were placed in this encounter.    Signed, Esmond Plants, M.D., Ph.D. 07/11/2017  [place], [place]

## 2017-07-11 ENCOUNTER — Encounter: Payer: Self-pay | Admitting: Cardiovascular Disease

## 2017-07-11 ENCOUNTER — Ambulatory Visit (INDEPENDENT_AMBULATORY_CARE_PROVIDER_SITE_OTHER): Payer: Medicare HMO | Admitting: Cardiovascular Disease

## 2017-07-11 VITALS — BP 108/82 | HR 133 | Ht 70.0 in | Wt 199.8 lb

## 2017-07-11 DIAGNOSIS — I272 Pulmonary hypertension, unspecified: Secondary | ICD-10-CM

## 2017-07-11 DIAGNOSIS — I5032 Chronic diastolic (congestive) heart failure: Secondary | ICD-10-CM

## 2017-07-11 DIAGNOSIS — I340 Nonrheumatic mitral (valve) insufficiency: Secondary | ICD-10-CM

## 2017-07-11 DIAGNOSIS — I480 Paroxysmal atrial fibrillation: Secondary | ICD-10-CM | POA: Diagnosis not present

## 2017-07-11 DIAGNOSIS — Z9889 Other specified postprocedural states: Secondary | ICD-10-CM

## 2017-07-11 DIAGNOSIS — G459 Transient cerebral ischemic attack, unspecified: Secondary | ICD-10-CM | POA: Diagnosis not present

## 2017-07-11 DIAGNOSIS — J449 Chronic obstructive pulmonary disease, unspecified: Secondary | ICD-10-CM

## 2017-07-11 MED ORDER — DIGOXIN 250 MCG PO TABS: 0.2500 mg | ORAL_TABLET | Freq: Every day | ORAL | 6 refills | Status: DC

## 2017-07-11 MED ORDER — METOPROLOL TARTRATE 25 MG PO TABS: 25.0000 mg | ORAL_TABLET | Freq: Two times a day (BID) | ORAL | 6 refills | Status: DC

## 2017-07-11 NOTE — Telephone Encounter (Signed)
Pt sched to see Dr. [name] today @ 11:00.

## 2017-07-11 NOTE — Patient Instructions (Addendum)
Medication Instructions:   Please start digoxin 2 pills the first day, Then one pill a day after that  Start metoprolol 25 mg twice a day  Labwork:  No new labs needed  Testing/Procedures:  No further testing at this time   Follow-Up: It was a pleasure seeing you in the office today. Please call us if you have new issues that need to be addressed before your next appt.  [phone]  Your physician wants you to follow-up in: Expect a call   If you need a refill on your cardiac medications before your next appointment, please call your pharmacy.

## 2017-07-14 ENCOUNTER — Telehealth: Payer: Self-pay | Admitting: Cardiovascular Disease

## 2017-07-14 MED ORDER — AMIODARONE HCL 200 MG PO TABS: 200.0000 mg | ORAL_TABLET | ORAL | 3 refills | Status: DC

## 2017-07-14 NOTE — Telephone Encounter (Signed)
Spoke with patients wife and she reports that his heart rates last night were 126 & 127 and his blood pressures were 140's over 100's. She states that he is not feeling good at all and has not been himself. He has been laying around and just feeling poorly. Let her know that I would make Dr. [name] aware and we may call back with further recommendations. She was appreciative for the call back and had no further questions at this time.

## 2017-07-14 NOTE — Telephone Encounter (Signed)
Spoke with patients wife and reviewed that Dr. [name] wants him to start taking amiodarone 400 mg twice a day for 5 days and then decrease down to 200 mg twice a day. Verified pharmacy to send medication and she also read back instructions. Instructed her to continue monitoring and give [place] a call if they have any new concerns. She verbalized understanding of our conversation and had no further questions at this time.

## 2017-07-14 NOTE — Telephone Encounter (Signed)
Dr. [name] wanted me to reach out to patient to see how his heart rates have been. Left voicemail message to call back.

## 2017-07-18 NOTE — Telephone Encounter (Signed)
Added patient to wait list Will call if we have any openings

## 2017-07-18 NOTE — Telephone Encounter (Signed)
Pt spouse wanting to give [place] an update on patient BP & HR  She states patient today he is feeling "real weird" headache is real bad she states.  Stated to her that his legs feel weak as well   07/18/17  This morning:  133/70 40  132/88 68  110/79 106   07/17/17  132/100 HR 122 Hr Later in the day: 118/117  Please call back

## 2017-07-18 NOTE — Telephone Encounter (Addendum)
Spoke w/ pt's wife.  She reports that pt's HR has been fluctuating from 40-122. Pt feels "weird" and has a HA. Advised her that I attempted to call him but he did not answer; she is at work and states that pt is probably asleep. Pt's HR this am was 40, 2 rechecks 68; at 8:50 106. The highest his BP has been was 122 yesterday.  Pt has not taken his am meds, as he was concerned that HR was so low when he woke up this am.  Spoke w/ Dr. [name].   As pt is in flutter, his HR will fluctuate and these readings are not alarming, he recommends he continue to monitor and try to get in ASAP to see Dr. Caryl Comes.  Pt is sched to see Dr. Caryl Comes on 07/26/17, but will forward to front desk to see if they have any openings/cancellations this week.  She is appreciative of the call.

## 2017-07-21 ENCOUNTER — Encounter: Payer: Self-pay | Admitting: Cardiovascular Disease

## 2017-07-21 ENCOUNTER — Telehealth: Payer: Self-pay | Admitting: Cardiovascular Disease

## 2017-07-21 NOTE — Telephone Encounter (Signed)
Pt's wife calling to see if Dr. [name] has reviewed this message and if he has any recommendations.  Message was not forwarded to our office this am.

## 2017-07-21 NOTE — Telephone Encounter (Signed)
Need BP and heart rate numbers Needs appt with Dr. Caryl Comes on the books

## 2017-07-21 NOTE — Telephone Encounter (Signed)
Paged by answering service re: wife has medication questions. Called back and spoke to patient's wife. She notes that the patient felt like he couldn't breathe this morning. He hasn't noticed increased swelling, doesn't weigh himself so unsure of weight gain. He feels slightly better now getting up and walking around, but not back to his baseline. Wife is worried this is due to amiodarone as it is a new medication for him.   We discussed that if he is concerned about his breathing, an ER evaluation is the safest option. She understands, but since the patient feels somewhat better, they are comfortable staying at home for now and waiting to speak to Dr. Donivan Scull office for further direction. I recommended holding off on taking any more amiodarone until they speak to the office.  Again counseled them on symptoms that require ER evaluation. Patient's wife understands and will go to ER if things worsen.  Buford Dresser, MD, PhD, overnight cardiology provider

## 2017-07-22 NOTE — Telephone Encounter (Signed)
Spoke w/ pt's wife.  Advised her of Dr. Donivan Scull recommendation.  She states that pt's BP has been 130/70s, HR 68. She reports his breathing very difficult, "almost smothering him". He has not taken amiodarone since conversation w/ Dr. Harrell Gave. Advised her that if sx worsen, to take pt to ED. She is agreeable and will call back w/ any further questions or concerns.

## 2017-07-26 ENCOUNTER — Ambulatory Visit (INDEPENDENT_AMBULATORY_CARE_PROVIDER_SITE_OTHER): Payer: Medicare HMO | Admitting: Internal Medicine

## 2017-07-26 ENCOUNTER — Encounter: Payer: Self-pay | Admitting: Internal Medicine

## 2017-07-26 VITALS — BP 120/80 | HR 123 | Ht 70.0 in | Wt 200.5 lb

## 2017-07-26 DIAGNOSIS — I480 Paroxysmal atrial fibrillation: Secondary | ICD-10-CM | POA: Diagnosis not present

## 2017-07-26 DIAGNOSIS — I5043 Acute on chronic combined systolic (congestive) and diastolic (congestive) heart failure: Secondary | ICD-10-CM | POA: Diagnosis not present

## 2017-07-26 NOTE — Patient Instructions (Signed)
Medication Instructions: - Your physician recommends that you continue on your current medications as directed. Please refer to the Current Medication list given to you today.  Labwork: - Your physician recommends that you have lab work today: CMET/ TSH/ CBC  Procedures/Testing: - Your physician has requested that you have an echocardiogram. Echocardiography is a painless test that uses sound waves to create images of your heart. It provides your doctor with information about the size and shape of your heart and how well your heart's chambers and valves are working. This procedure takes approximately one hour. There are no restrictions for this procedure.  Follow-Up: - pending echo and Dr. Olin Pia discussion with Dr. Roxy Manns  Any Additional Special Instructions Will Be Listed Below (If Applicable).     If you need a refill on your cardiac medications before your next appointment, please call your pharmacy.

## 2017-07-26 NOTE — Progress Notes (Signed)
ELECTROPHYSIOLOGY CONSULT NOTE  Patient ID: Michael Delacruz, MRN: [id number], DOB/AGE: 09/15/1941 76 y.o. Admit date: (Not on file) Date of Consult: 08/03/2017  Primary Physician: Patient, No Pcp Per Primary Cardiologist: TG     Michael Delacruz is a 76 y.o. male who is being seen today for the evaluation of Atrial fib  at the request of TG.    HPI Michael Delacruz is a 76 y.o. male referred for atrial fibrillation    he has a complicated cardiac history. He underwent mitral valve repair 11/16 for mitral regurgitation related to prolapse. He underwent MAZE concomitantly and clipping of his left atrial appendage with AtriClip \  He has hx of post MAZE transient hand weakness concerning for TIA  He has a history of previous retinal bleeding aggravated by warfarin. Currently is not on anticoagulation.  He was recently seen by Dr. [name] for worsening shortness of breath. He had noted increased heart rates of late.  SOB remains significant < 100 ft with some edema and weakness and LH    DATE TEST    8/16    Echo   EF 55-60 % Severe MR 2/2 post leaflet prolapse  12/16    Echo   EF 55-60 % Mild Aortic root dilitation MR mild        Date Cr Hgb/Hct TSH   1/17 1.03 41 3.162             Past Medical History:  Diagnosis Date  . Arthritis   . Asthma   . Bradycardia   . CAD in native artery    a. LHC 09/2015: 40% pCx, 35% mRCA.  [name] Chronic diastolic congestive heart failure ([place])   . COPD (chronic obstructive pulmonary disease) ([place])   . Dilation of intestine 01/2015  . Fibromyalgia   . GERD (gastroesophageal reflux disease)   . History of blood clots    eye   . History of hiatal hernia   . History of rheumatic fever   . Hypertension   . Kidney stone   . PAF (paroxysmal atrial fibrillation) ([place])    a. s/p TEE/DCCV 07/2015. b. H/o bleeding on Coumadin when INR >5, changed to Eliquis.  . S/P Minimally invasive maze operation for atrial fibrillation 10/22/2015   Complete  bilateral atrial lesion set using cryothermy and bipolar radiofrequency ablation with clipping of LA appendage via right mini thoracotomy approach  . S/P minimally invasive mitral valve repair 10/22/2015   Complex valvuloplasty including triangular resection of posterior leaflet, artificial Gore-tex neochord placement x6 and 38 mm Sorin Memo 3D Rechord ring annuloplasty via right minithoracotomy approach  . Severe mitral regurgitation    a. s/p MV repair 10/2015.  . Stroke ([place])   . TIA (transient ischemic attack)   . Tobacco abuse       Surgical History:  Past Surgical History:  Procedure Laterality Date  . ANKLE SURGERY    . CARDIAC CATHETERIZATION N/A 10/03/2015   Procedure: Right and Left Heart Cath and Coronary Angiography;  Surgeon: [name], MD;  Location: [place] [facility];  Service: Cardiovascular;  Laterality: N/A;  . COLONOSCOPY    . ELECTROPHYSIOLOGIC STUDY N/A 08/18/2015   Procedure: CARDIOVERSION;  Surgeon: Wellington Hampshire, MD;  Location: [facility];  Service: Cardiovascular;  Laterality: N/A;  . MINIMALLY INVASIVE MAZE PROCEDURE N/A 10/22/2015   Procedure: MINIMALLY INVASIVE MAZE PROCEDURE;  Surgeon: Rexene Alberts, MD;  Location: [place];  Service: Open Heart Surgery;  Laterality: N/A;  . MITRAL VALVE REPAIR Right 10/22/2015   Procedure: MINIMALLY INVASIVE MITRAL VALVE REPAIR (MVR);  Surgeon: Rexene Alberts, MD;  Location: [place];  Service: Open Heart Surgery;  Laterality: Right;  . SINUS EXPLORATION    . TEE WITHOUT CARDIOVERSION N/A 08/18/2015   Procedure: TRANSESOPHAGEAL ECHOCARDIOGRAM (TEE);  Surgeon: Wellington Hampshire, MD;  Location: [facility];  Service: Cardiovascular;  Laterality: N/A;  . TEE WITHOUT CARDIOVERSION N/A 10/22/2015   Procedure: TRANSESOPHAGEAL ECHOCARDIOGRAM (TEE);  Surgeon: Rexene Alberts, MD;  Location: [place];  Service: Open Heart Surgery;  Laterality: N/A;     Home Meds: Prior to Admission medications   Medication Sig Start Date End Date  Taking? Authorizing Provider  amiodarone (PACERONE) 200 MG tablet Take 1 tablet (200 mg total) by mouth as directed. 2 tablets twice a day for 5 days. Then 1 tablet twice a day. 07/14/17   [name], MD  aspirin EC 81 MG tablet Take 1 tablet (81 mg total) by mouth daily. 02/02/16   Rexene Alberts, MD  digoxin (LANOXIN) 0.25 MG tablet Take 1 tablet (0.25 mg total) by mouth daily. 07/11/17   [name], MD  metoprolol tartrate (LOPRESSOR) 25 MG tablet Take 1 tablet (25 mg total) by mouth 2 (two) times daily. 07/11/17   [name], MD    Allergies:  Allergies  Allergen Reactions  . Codeine Nausea Only  . Macrodantin [Nitrofurantoin Macrocrystal] Rash  . Morphine And Related Rash    Social History   Social History  . Marital status: Married    Spouse name: N/A  . Number of children: N/A  . Years of education: N/A   Occupational History  . Not on file.   Social History Main Topics  . Smoking status: Current Every Day Smoker    Packs/day: 0.25    Years: 52.00    Types: Cigarettes  . Smokeless tobacco: Never Used  . Alcohol use No     Comment: rare  . Drug use: No  . Sexual activity: Not on file   Other Topics Concern  . Not on file   Social History Narrative  . No narrative on file     Family History  Problem Relation Age of Onset  . Hypertension Other      ROS:  Please see the history of present illness.     All other systems reviewed and negative.    Physical Exam:  Blood pressure 120/80, pulse (!) 123, height 5\' 10"  (1.778 m), weight 200 lb 8 oz (90.9 kg). General: Well developed, well nourished male in no acute distress. Head: Normocephalic, atraumatic, sclera non-icteric, no xanthomas, nares are without discharge. EENT: normal  Lymph Nodes:  none Neck: Negative for carotid bruits. JVD not elevated. Back:without scoliosis kyphosis Lungs: Clear bilaterally to auscultation without wheezes, rales, or rhonchi. Breathing is unlabored. Heart:  Irregularly irregular rate and rhythm  2/6 systolic murmur . No rubs, or gallops appreciated. Abdomen: Soft, non-tender, non-distended with normoactive bowel sounds. No hepatomegaly. No rebound/guarding. No obvious abdominal masses. Msk:  Strength and tone appear normal for age. Extremities: No clubbing or cyanosis. 1+2+ edema.  Distal pedal pulses are 2+ and equal bilaterally. Skin: Warm and Dry Neuro: Alert and oriented X 3. CN III-XII intact Grossly normal sensory and motor function . Psych:  Responds to questions appropriately with a normal affect.      Labs: Cardiac Enzymes No results for input(s): CKTOTAL, CKMB, TROPONINI in the last 72  hours. CBC Lab Results  Component Value Date   WBC 8.0 07/26/2017   HGB 14.6 07/26/2017   HCT 43.7 07/26/2017   MCV 93 07/26/2017   PLT 183 07/26/2017   PROTIME: No results for input(s): LABPROT, INR in the last 72 hours. Chemistry No results for input(s): NA, K, CL, CO2, BUN, CREATININE, CALCIUM, PROT, BILITOT, ALKPHOS, ALT, AST, GLUCOSE in the last 168 hours.  Invalid input(s): LABALBU Lipids Lab Results  Component Value Date   CHOL 169 12/06/2015   HDL 36 (L) 12/06/2015   LDLCALC 116 (H) 12/06/2015   TRIG 85 12/06/2015   BNP No results found for: PROBNP Thyroid Function Tests: No results for input(s): TSH, T4TOTAL, T3FREE, THYROIDAB in the last 72 hours.  Invalid input(s): FREET3 Miscellaneous No results found for: DDIMER  Radiology/Studies:  No results found.  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  The former outlined the physiology as retinal venous thrombosis associated with secondary venous hypertension that resulted in a bleed. He isn't having laser surgery. Vision in his right eye is terrible when last seen 2/17. He  does not note any contraindication to anticoagulation. Speaking with Dr. Roxy Manns, especially given the patient's post operative TIA/stroke involving his hand, would not undertake cardioversion or left atrial ablation procedure without anticoagulation. And has no contraindication to anticoagulation from his perspective--the above converstations took place the day of the patient visit but after the pt had gone home  Hence with rapid rates would undertake anticoagulation and TEE -- this will be importat  Also to reassess LV function  Was unable to get up with pt         Virl Axe

## 2017-07-27 ENCOUNTER — Telehealth: Payer: Self-pay | Admitting: *Deleted

## 2017-07-27 LAB — COMPREHENSIVE METABOLIC PANEL
ALT: 12 IU/L (ref 0–44)
AST: 13 IU/L (ref 0–40)
Albumin/Globulin Ratio: 1.5 (ref 1.2–2.2)
Albumin: 4.2 g/dL (ref 3.5–4.8)
Alkaline Phosphatase: 100 IU/L (ref 39–117)
BUN/Creatinine Ratio: 11 (ref 10–24)
BUN: 11 mg/dL (ref 8–27)
Bilirubin Total: 0.7 mg/dL (ref 0.0–1.2)
CO2: 20 mmol/L (ref 20–29)
Calcium: 9.3 mg/dL (ref 8.6–10.2)
Chloride: 101 mmol/L (ref 96–106)
Creatinine, Ser: 1.03 mg/dL (ref 0.76–1.27)
GFR calc Af Amer: 82 mL/min/{1.73_m2} (ref >59)
GFR calc non Af Amer: 71 mL/min/{1.73_m2} (ref >59)
Globulin, Total: 2.8 g/dL (ref 1.5–4.5)
Glucose: 112 mg/dL — ABNORMAL HIGH (ref 65–99)
Potassium: 4.2 mmol/L (ref 3.5–5.2)
Sodium: 140 mmol/L (ref 134–144)
Total Protein: 7 g/dL (ref 6.0–8.5)

## 2017-07-27 LAB — CBC WITH DIFFERENTIAL/PLATELET
Basophils Absolute: 0 10*3/uL (ref 0.0–0.2)
Basos: 1 %
EOS (ABSOLUTE): 0.2 10*3/uL (ref 0.0–0.4)
Eos: 2 %
Hematocrit: 43.7 % (ref 37.5–51.0)
Hemoglobin: 14.6 g/dL (ref 13.0–17.7)
Immature Grans (Abs): 0 10*3/uL (ref 0.0–0.1)
Immature Granulocytes: 0 %
Lymphocytes Absolute: 1.6 10*3/uL (ref 0.7–3.1)
Lymphs: 20 %
MCH: 31.1 pg (ref 26.6–33.0)
MCHC: 33.4 g/dL (ref 31.5–35.7)
MCV: 93 fL (ref 79–97)
Monocytes Absolute: 0.8 10*3/uL (ref 0.1–0.9)
Monocytes: 10 %
Neutrophils Absolute: 5.3 10*3/uL (ref 1.4–7.0)
Neutrophils: 67 %
Platelets: 183 10*3/uL (ref 150–379)
RBC: 4.7 x10E6/uL (ref 4.14–5.80)
RDW: 13.1 % (ref 12.3–15.4)
WBC: 8 10*3/uL (ref 3.4–10.8)

## 2017-07-27 LAB — TSH: TSH: 5.31 u[IU]/mL — ABNORMAL HIGH (ref 0.450–4.500)

## 2017-07-27 NOTE — Telephone Encounter (Signed)
error 

## 2017-08-01 ENCOUNTER — Telehealth: Payer: Self-pay

## 2017-08-01 NOTE — Telephone Encounter (Signed)
lmtcb for lab results

## 2017-08-03 ENCOUNTER — Encounter: Payer: Self-pay | Admitting: Internal Medicine

## 2017-08-03 ENCOUNTER — Ambulatory Visit (INDEPENDENT_AMBULATORY_CARE_PROVIDER_SITE_OTHER): Payer: Medicare HMO | Admitting: *Deleted

## 2017-08-03 VITALS — BP 116/77 | HR 113 | Resp 19 | Ht 70.0 in | Wt 206.5 lb

## 2017-08-03 DIAGNOSIS — I4891 Unspecified atrial fibrillation: Secondary | ICD-10-CM

## 2017-08-03 NOTE — Patient Instructions (Signed)
We will route message and EKG's over to Dr. Caryl Comes and will call you with any recommendations.  It was a pleasure seeing you today here in the office. Please do not hesitate to give [place] a call back if you have any further questions. [place], BSN

## 2017-08-03 NOTE — Progress Notes (Signed)
1.) Reason for visit: EKG  2.) Name of MD requesting visit: Dr. Caryl Comes  3.) H&P: Afib  4.) ROS related to problem: Afib, fluctuating heart rate.  5.) Assessment and plan per MD: Dr. Saunders Revel in office reviewed EKG's which show Afib. Will send this information to Dr. Caryl Comes and will then call patient back with any further recommendations.

## 2017-08-03 NOTE — Progress Notes (Unsigned)
spoek with pt He is feeling better today and his HR is slowed down-- cough also abated --likely CHF To come in for ECG to assess for sinus If so >> amio  Echo  He is not interested in anticoagulation despite discussions with ophthalmology and CV surgery I suggested his CVA risk is about 10-12% yr with CHADSVASc score of >6- He says that is not high enough to risk the residual vision in his R eye

## 2017-08-05 ENCOUNTER — Encounter: Payer: Self-pay | Admitting: Internal Medicine

## 2017-08-05 NOTE — Progress Notes (Unsigned)
When I spoke with the patient the other day his heart rate was much slower. ECG however demonstrated persistent atrial arrhythmias; hence, elected not to begin amiodarone with the hopes of trying to  maintain sinus rhythm. as noted  previously, he is not inclined because of his eyes to take anticoagulation.

## 2017-08-09 ENCOUNTER — Other Ambulatory Visit: Payer: Self-pay

## 2017-08-09 ENCOUNTER — Ambulatory Visit (INDEPENDENT_AMBULATORY_CARE_PROVIDER_SITE_OTHER): Payer: Medicare HMO

## 2017-08-09 DIAGNOSIS — I480 Paroxysmal atrial fibrillation: Secondary | ICD-10-CM | POA: Diagnosis not present

## 2017-08-23 ENCOUNTER — Telehealth: Payer: Self-pay | Admitting: Internal Medicine

## 2017-08-23 DIAGNOSIS — I480 Paroxysmal atrial fibrillation: Secondary | ICD-10-CM

## 2017-08-23 NOTE — Telephone Encounter (Signed)
S/w with wife, ok per DPR. She verbalized understanding of echo results. She understood I did not have additional information at this time and will route to Alvis Lemmings for further information. She was very Patent attorney.

## 2017-08-23 NOTE — Telephone Encounter (Signed)
Pt wife calling to get Echo and Lab results  Please call back

## 2017-08-25 NOTE — Telephone Encounter (Signed)
I attempted to call the patient's wife (DPR) back at her contact # x 2. It sounds as though someone picks up the phone both times, but there is no one answering.  I will try back later.

## 2017-08-26 NOTE — Telephone Encounter (Signed)
I spoke with Mrs. Bocock and advised her again of Mr. Hallquist echo results and Dr. Olin Pia recommendations for a 24 hour holter at this time.  She is agreeable and aware that I will have scheduling in [place] contact her to arrange.   Notes recorded by Deboraha Sprang, MD on 08/19/2017 at 8:31 AM EDT Please Inform Patient Echo showed Worsening 60>>40-45% heart muscle function  We would have to presume that this is related to his rate Could we get a 24 hr holter to look at HR and will have to discuss options with TG but may included AV ablation and pacing as anticoagulation is not for him an issue

## 2017-08-26 NOTE — Telephone Encounter (Signed)
I left a message for Michael Delacruz to call.

## 2017-08-30 ENCOUNTER — Ambulatory Visit (INDEPENDENT_AMBULATORY_CARE_PROVIDER_SITE_OTHER): Payer: Medicare HMO

## 2017-08-30 DIAGNOSIS — I480 Paroxysmal atrial fibrillation: Secondary | ICD-10-CM | POA: Diagnosis not present

## 2017-08-31 ENCOUNTER — Other Ambulatory Visit: Payer: Self-pay

## 2017-08-31 ENCOUNTER — Telehealth: Payer: Self-pay | Admitting: Cardiovascular Disease

## 2017-08-31 ENCOUNTER — Emergency Department: Payer: Medicare HMO

## 2017-08-31 ENCOUNTER — Emergency Department
Admission: EM | Admit: 2017-08-31 | Discharge: 2017-08-31 | Disposition: A | Discharge status: Home / Self Care | Payer: Medicare HMO | Attending: Emergency Medicine | Admitting: Emergency Medicine

## 2017-08-31 ENCOUNTER — Encounter: Payer: Self-pay | Admitting: *Deleted

## 2017-08-31 DIAGNOSIS — I110 Hypertensive heart disease with heart failure: Secondary | ICD-10-CM | POA: Insufficient documentation

## 2017-08-31 DIAGNOSIS — R079 Chest pain, unspecified: Secondary | ICD-10-CM | POA: Diagnosis not present

## 2017-08-31 DIAGNOSIS — Z79899 Other long term (current) drug therapy: Secondary | ICD-10-CM | POA: Diagnosis not present

## 2017-08-31 DIAGNOSIS — J189 Pneumonia, unspecified organism: Secondary | ICD-10-CM

## 2017-08-31 DIAGNOSIS — F17210 Nicotine dependence, cigarettes, uncomplicated: Secondary | ICD-10-CM | POA: Insufficient documentation

## 2017-08-31 DIAGNOSIS — Z952 Presence of prosthetic heart valve: Secondary | ICD-10-CM | POA: Insufficient documentation

## 2017-08-31 DIAGNOSIS — I5032 Chronic diastolic (congestive) heart failure: Secondary | ICD-10-CM | POA: Insufficient documentation

## 2017-08-31 DIAGNOSIS — J181 Lobar pneumonia, unspecified organism: Secondary | ICD-10-CM | POA: Diagnosis not present

## 2017-08-31 DIAGNOSIS — Z7982 Long term (current) use of aspirin: Secondary | ICD-10-CM | POA: Insufficient documentation

## 2017-08-31 DIAGNOSIS — J40 Bronchitis, not specified as acute or chronic: Secondary | ICD-10-CM | POA: Diagnosis not present

## 2017-08-31 DIAGNOSIS — J449 Chronic obstructive pulmonary disease, unspecified: Secondary | ICD-10-CM | POA: Diagnosis not present

## 2017-08-31 DIAGNOSIS — I509 Heart failure, unspecified: Secondary | ICD-10-CM

## 2017-08-31 DIAGNOSIS — I4891 Unspecified atrial fibrillation: Secondary | ICD-10-CM

## 2017-08-31 DIAGNOSIS — I2510 Atherosclerotic heart disease of native coronary artery without angina pectoris: Secondary | ICD-10-CM | POA: Insufficient documentation

## 2017-08-31 LAB — TROPONIN I: Troponin I: <0.03 ng/mL (ref <0.03)

## 2017-08-31 LAB — BASIC METABOLIC PANEL
Anion gap: 8 (ref 5–15)
BUN: 8 mg/dL (ref 6–20)
CO2: 25 mmol/L (ref 22–32)
Calcium: 8.9 mg/dL (ref 8.9–10.3)
Chloride: 107 mmol/L (ref 101–111)
Creatinine, Ser: 1.1 mg/dL (ref 0.61–1.24)
GFR calc Af Amer: >60 mL/min (ref >60)
GFR calc non Af Amer: >60 mL/min (ref >60)
Glucose, Bld: 120 mg/dL — ABNORMAL HIGH (ref 65–99)
Potassium: 4.1 mmol/L (ref 3.5–5.1)
Sodium: 140 mmol/L (ref 135–145)

## 2017-08-31 LAB — CBC
HCT: 42.9 % (ref 40.0–52.0)
Hemoglobin: 14.6 g/dL (ref 13.0–18.0)
MCH: 31.9 pg (ref 26.0–34.0)
MCHC: 34.1 g/dL (ref 32.0–36.0)
MCV: 93.5 fL (ref 80.0–100.0)
Platelets: 151 10*3/uL (ref 150–440)
RBC: 4.58 MIL/uL (ref 4.40–5.90)
RDW: 14 % (ref 11.5–14.5)
WBC: 7.4 10*3/uL (ref 3.8–10.6)

## 2017-08-31 MED ORDER — OXYCODONE-ACETAMINOPHEN 5-325 MG PO TABS
2.0000 | ORAL_TABLET | Freq: Once | ORAL | Status: AC
  Administered 2017-08-31: 2 via ORAL
  Filled 2017-08-31: qty 2

## 2017-08-31 MED ORDER — OXYCODONE-ACETAMINOPHEN 5-325 MG PO TABS: 1.0000 | ORAL_TABLET | Freq: Four times a day (QID) | ORAL | 0 refills | Status: DC | PRN

## 2017-08-31 MED ORDER — IOPAMIDOL (ISOVUE-370) INJECTION 76%
75.0000 mL | Freq: Once | INTRAVENOUS | Status: AC | PRN
  Administered 2017-08-31: 75 mL via INTRAVENOUS

## 2017-08-31 MED ORDER — LEVOFLOXACIN 750 MG PO TABS: 750.0000 mg | ORAL_TABLET | Freq: Every day | ORAL | 0 refills | Status: AC

## 2017-08-31 MED ORDER — FUROSEMIDE 20 MG PO TABS: 20.0000 mg | ORAL_TABLET | Freq: Every day | ORAL | 0 refills | Status: DC

## 2017-08-31 MED ORDER — LEVOFLOXACIN 750 MG PO TABS
750.0000 mg | ORAL_TABLET | Freq: Once | ORAL | Status: AC
  Administered 2017-08-31: 750 mg via ORAL
  Filled 2017-08-31: qty 1

## 2017-08-31 MED ORDER — FENTANYL CITRATE (PF) 100 MCG/2ML IJ SOLN
50.0000 ug | Freq: Once | INTRAMUSCULAR | Status: AC
  Administered 2017-08-31: 50 ug via INTRAVENOUS
  Filled 2017-08-31: qty 2

## 2017-08-31 NOTE — Telephone Encounter (Signed)
Patient presented to the lobby to bring back his heart monitor.  Patient complains of chest pain "5-8" out of 10. The pain radiates down his left arm, neck and jaw. It started last night around 11pm and has been varying in intensity since then with no relief. Patient states it is hard for him to take a deep breath in. Patient taken to ED in a wheelchair and they will evaluate him.

## 2017-08-31 NOTE — Discharge Instructions (Signed)
As we discussed please take your antibiotics as prescribed for their entire course. Please take your pain medication as needed, as written. For the next 5 days please take your furosemide/Lasix once daily in the morning. Please follow-up with your primary care doctor tomorrow for recheck/reevaluation. Follow up with cardiology next week for recheck. Return to the emergency department for any worsening chest pain, any trouble breathing, or any other symptom personally concerning to yourself.  Also as we discussed her CT scan showed a slightly enlarged aorta/blood vessel, please discuss this with your primary care doctor as this will likely require imaging in the future to ensure it does not continue to enlarge. Your CT scan also showed a enlarged pulmonary nodule that will need further imaging likely within 6 months for reevaluation.  a copy of your CT report is below, please bring this to your doctor.  IMPRESSION:  1. No acute pulmonary embolism.  2. RIGHT lower lobe focal consolidation equivocal for pneumonia,  potential scarring.  3. Moderate cardiomegaly.  4. Bronchial wall thickening seen with pulmonary edema, reactive  airway disease and bronchitis. Diffuse hazy ground-glass opacities,  possible pulmonary edema.  5. **An incidental finding of potential clinical significance has  been found. 7 mm sub solid RIGHT middle lobe pulmonary nodule.  Recommend follow-up CT chest at 3-6 months to confirm persistence.**  6. **An incidental finding of potential clinical significance has  been found. 4.2 cm ascending aortic aneurysm, increased from 4 cm.  Recommend annual imaging followup by CTA or MRA. This recommendation  follows 2010 ACCF/AHA/AATS/ACR/ASA/SCA/SCAI/SIR/STS/SVM Guidelines  for the Diagnosis and Management of Patients with Thoracic Aortic  Disease. Circulation. 2010; 121: M629-U765**

## 2017-08-31 NOTE — ED Notes (Signed)
Pt assisted to use urinal.  

## 2017-08-31 NOTE — Telephone Encounter (Signed)
Pt c/o Shortness Of Breath: STAT if SOB developed within the last 24 hours or pt is noticeably SOB on the phone  1. Are you currently SOB (can you hear that pt is SOB on the phone)? Yes   2. How long have you been experiencing SOB? Started at 11 last night   3. Are you SOB when sitting or when up moving around?  All the time   4. Are you currently experiencing any other symptoms?  Pain L side head face neck down L arm

## 2017-08-31 NOTE — ED Notes (Signed)
While wheeling pt to lobby pt reported to this RN that his right leg has started hurting as well. PT reports pain to be cramping in nature. No injury reported. No discoloration. No decreased sensation noted and pulse is equal to left foot and strong at this time. Foot and leg remain warm to the touch.

## 2017-08-31 NOTE — Telephone Encounter (Signed)
Notified Scientist, forensic charge nurse in the ED that patient was having active pain and nurse is bringing him to them for evaluation. She verbalized understanding with no further questions at this time.

## 2017-08-31 NOTE — ED Notes (Signed)
Pt signed paper copy of discharge 

## 2017-08-31 NOTE — ED Notes (Signed)
Pt c/o headache that radiates into the left side of jaw and left shoulder - he also c/o chest pain with inspirations - he reports having shortness of breath over the last few months - he reports cardiac history and hx of strokes

## 2017-08-31 NOTE — ED Notes (Signed)
Date and time results received: 08/31/17 1105 (use smartphrase ".now" to insert current time)  Test: troponin Critical Value:1.95  Name of Provider Notified: Dr Joya Martyr  Orders Received? Or Actions Taken?: pt placed in major bed

## 2017-08-31 NOTE — ED Provider Notes (Signed)
[facility] Emergency Department Provider Note  Time seen: 2:46 PM  I have reviewed the triage vital signs and the nursing notes.   HISTORY  Chief Complaint Chest Pain    HPI KHOLTON COATE is a 76 y.o. male with a past medical history of CAD, COPD, fibromyalgia, gastric reflux, hypertension, paroxysmal atrial fibrillation, CVA who presents to the emergency department for left-sided chest pain. According to the patient since last night he has been experiencing left-sided chest pain which she states is a 10/10 at times, worse with cough or deep inspiration. States the pain location radiate to his left arm and sometimes to his left neck. States mild shortness of breath but states this has been ongoing for many months. Also states mild left headache. Denies any weakness or numbness. 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 Patient states he has been coughing for the past several weeks which has worsened somewhat with white and sometimes yellow sputum production. Denies any fever.  Past Medical History:  Diagnosis Date  . Arthritis   . Asthma   . Bradycardia   . CAD in native artery    a. LHC 09/2015: 40% pCx, 35% mRCA.  [name] Chronic diastolic congestive heart failure ([place])   . COPD (chronic obstructive pulmonary disease) ([place])   . Dilation of intestine 01/2015  . Fibromyalgia   . GERD (gastroesophageal reflux disease)   . History of blood clots    eye   . History of hiatal hernia   . History of rheumatic fever   . Hypertension   . Kidney stone   . PAF (paroxysmal atrial fibrillation) ([place])    a. s/p TEE/DCCV 07/2015. b. H/o bleeding on Coumadin when INR >5, changed to Eliquis.  . S/P Minimally invasive maze operation for atrial fibrillation 10/22/2015   Complete bilateral atrial lesion set using cryothermy and bipolar radiofrequency ablation with clipping of LA appendage via right mini thoracotomy approach  . S/P minimally invasive mitral valve repair  10/22/2015   Complex valvuloplasty including triangular resection of posterior leaflet, artificial Gore-tex neochord placement x6 and 38 mm Sorin Memo 3D Rechord ring annuloplasty via right minithoracotomy approach  . Severe mitral regurgitation    a. s/p MV repair 10/2015.  . Stroke ([place])   . TIA (transient ischemic attack)   . Tobacco abuse     Patient Active Problem List   Diagnosis Date Noted  . Vision loss 12/31/2015  . TIA (transient ischemic attack) 12/06/2015  . S/P MVR (mitral valve repair) 11/11/2015  . S/P Minimally invasive maze operation for atrial fibrillation 10/22/2015  . Angina pectoris ([place]) 10/03/2015  . Chronic diastolic congestive heart failure ([place])   . Atrial fibrillation ([place]) 08/15/2015  . Atrial fibrillation with RVR ([place])   . Centrilobular emphysema ([place])   . Shortness of breath   . Hypotension 11/02/2012  . Stress at home 11/02/2012  . Leg pain 11/02/2012  . Smoking 04/27/2012  . Hyperlipidemia 04/27/2012  . CAD (coronary artery disease) 04/27/2012  . Heart palpitations 09/03/2011  . Dizziness 05/19/2011  . SOB (shortness of breath) 05/05/2011  . MVP (mitral valve prolapse) 05/05/2011  . Mitral valve regurgitation 05/05/2011  . COPD (chronic obstructive pulmonary disease) ([place]) 05/05/2011  . Pulmonary HTN ([place]) 05/05/2011  . CHEST PAIN UNSPECIFIED 01/04/2011    Past Surgical History:  Procedure Laterality Date  . ANKLE SURGERY    . CARDIAC CATHETERIZATION N/A 10/03/2015   Procedure: Right and Left Heart Cath and Coronary Angiography;  Surgeon: [name], MD;  Location: [place] [facility];  Service: Cardiovascular;  Laterality: N/A;  . COLONOSCOPY    . ELECTROPHYSIOLOGIC STUDY N/A 08/18/2015   Procedure: CARDIOVERSION;  Surgeon: Wellington Hampshire, MD;  Location: [facility] OR[facility];  Service: Cardiovascular;  Laterality: N/A;  . MINIMALLY INVASIVE MAZE PROCEDURE N/A 10/22/2015   Procedure: MINIMALLY INVASIVE MAZE PROCEDURE;  Surgeon: Rexene Alberts,  MD;  Location: [place];  Service: Open Heart Surgery;  Laterality: N/A;  . MITRAL VALVE REPAIR Right 10/22/2015   Procedure: MINIMALLY INVASIVE MITRAL VALVE REPAIR (MVR);  Surgeon: Rexene Alberts, MD;  Location: [place];  Service: Open Heart Surgery;  Laterality: Right;  . SINUS EXPLORATION    . TEE WITHOUT CARDIOVERSION N/A 08/18/2015   Procedure: TRANSESOPHAGEAL ECHOCARDIOGRAM (TEE);  Surgeon: Wellington Hampshire, MD;  Location: [facility] OR[facility];  Service: Cardiovascular;  Laterality: N/A;  . TEE WITHOUT CARDIOVERSION N/A 10/22/2015   Procedure: TRANSESOPHAGEAL ECHOCARDIOGRAM (TEE);  Surgeon: Rexene Alberts, MD;  Location: [place];  Service: Open Heart Surgery;  Laterality: N/A;    Prior to Admission medications   Medication Sig Start Date End Date Taking? Authorizing Provider  aspirin EC 81 MG tablet Take 1 tablet (81 mg total) by mouth daily. 02/02/16   Rexene Alberts, MD  digoxin (LANOXIN) 0.25 MG tablet Take 1 tablet (0.25 mg total) by mouth daily. 07/11/17   [name], MD  metoprolol tartrate (LOPRESSOR) 25 MG tablet Take 1 tablet (25 mg total) by mouth 2 (two) times daily. 07/11/17   [name], MD    Allergies  Allergen Reactions  . Codeine Nausea Only  . Macrodantin [Nitrofurantoin Macrocrystal] Rash  . Morphine And Related Rash    Family History  Problem Relation Age of Onset  . Hypertension Other     Social History Social History  Substance Use Topics  . Smoking status: Current Every Day Smoker    Packs/day: 0.50    Years: 52.00    Types: Cigarettes  . Smokeless tobacco: Never Used  . Alcohol use No     Comment: rare    Review of Systems Constitutional: Negative for fever. Cardiovascular: positive for left-sided chest pain worse with deep inspiration Respiratory: mild shortness of breath times months Gastrointestinal: Negative for abdominal pain Musculoskeletal: Negative for back pain. Negative for leg pain or swelling. Neurological: Negative for headache All  other ROS negative  ____________________________________________   PHYSICAL EXAM:  VITAL SIGNS: ED Triage Vitals  Enc Vitals Group     BP 08/31/17 1115 127/87     Pulse Rate 08/31/17 1115 (!) 127     Resp 08/31/17 1115 16     Temp 08/31/17 1115 98.5 F (36.9 C)     Temp Source 08/31/17 1115 Oral     SpO2 08/31/17 1115 97 %     Weight 08/31/17 1113 202 lb (91.6 kg)     Height 08/31/17 1113 5\' 10"  (1.778 m)     Head Circumference --      Peak Flow --      Pain Score 08/31/17 1112 7     Pain Loc --      Pain Edu? --      Excl. in [place]? --     Constitutional: Alert and oriented. Well appearing and in no distress. Eyes: Normal exam ENT   Head: Normocephalic and atraumatic.   Mouth/Throat: Mucous membranes are moist. Cardiovascular: Normal rate, regular rhythm. No murmur Respiratory: Normal respiratory effort without tachypnea nor  retractions. Breath sounds are clear. Moderate left chest tenderness to palpation. Gastrointestinal: Soft and nontender. No distention.   Musculoskeletal: Nontender with normal range of motion in all extremities. No lower extremity tenderness or edema. Neurologic:  Normal speech and language. No gross focal neurologic deficits  Skin:  Skin is warm, dry and intact.  Psychiatric: Mood and affect are normal.   ____________________________________________    EKG  EKG reviewed and interpreted by myself shows sinus tachycardia 127 bpm, narrow QRS, normal axis, normal intervals, nonspecific ST changes.  ____________________________________________    RADIOLOGY  IMPRESSION: 1. No acute pulmonary embolism. 2. RIGHT lower lobe focal consolidation equivocal for pneumonia, potential scarring. 3. Moderate cardiomegaly. 4. Bronchial wall thickening seen with pulmonary edema, reactive airway disease and bronchitis. Diffuse hazy ground-glass opacities, possible pulmonary edema. 5. **An incidental finding of potential clinical significance has been  found. 7 mm sub solid RIGHT middle lobe pulmonary nodule. Recommend follow-up CT chest at 3-6 months to confirm persistence.** 6. **An incidental finding of potential clinical significance has been found. 4.2 cm ascending aortic aneurysm, increased from 4 cm. Recommend annual imaging followup by CTA or MRA. This recommendation follows 2010 ACCF/AHA/AATS/ACR/ASA/SCA/SCAI/SIR/STS/SVM Guidelines for the Diagnosis and Management of Patients with Thoracic Aortic Disease. Circulation. 2010; 121: [id number]**  ____________________________________________   INITIAL IMPRESSION / ASSESSMENT AND PLAN / ED COURSE  Pertinent labs & imaging results that were available during my care of the patient were reviewed by me and considered in my medical decision making (see chart for details).  patient presents the emergency department for left-sided chest pain worse with deep inspiration also tachycardic to 127 bpm. Normal oxygen saturation 97-100% on room air. Patient's chest discomfort is somewhat reproducible on exam. Given the patient's tachycardia with pleuritic chest pain we'll obtain a CT scan of the chest to rule out pulmonary emboli.  CT scan is negative for PE. Does show a possible right lower lobe pneumonia versus scarring. There is also bronchial wall thickening which is seen in pulmonary edema or reactive airway disease or bronchitis. There is also several borderline to large or nodules noted in a 4.2 cm ascending aortic aneurysm. I discussed these findings with the patient as well as follow-up with his doctor regarding these findings. I will copy the CT report onto his discharge paperwork. Patient remains tachycardic around 120 130 bpm. I discussed this with his cardiologist. The patient has been in a flutter with a 2 to one block for quite some time. They're not able to anticoagulate or adequately rate control. They are working on doing this in the office. Cardiology essentially said this is the  patient's current baseline heart rate. Given the possible pneumonia with possible mild edema we'll place the patient on antibiotics as well as a short course of Lasix. As far so patient's chest discomfort his CT is negative for PE. His troponin is negative and I believe the patient is safe for discharge home from this standpoint.patient's oxygen levels currently 100% on room air. He appears well, no distress, lying in bed watching TV. Patient is agreeable to this plan of care.  ____________________________________________   FINAL CLINICAL IMPRESSION(S) / ED DIAGNOSES  left chest pain pneumonia   Harvest Dark, MD 08/31/17 1816

## 2017-08-31 NOTE — ED Triage Notes (Signed)
Pt to ED reporting left sided chest pain beginning last night. Pt reports pani radiates to the left side of his neck and into his left shoulder. PT reports a productive cough x 2 months with intermittent SOB and pain on inhalation but denies fevers and reports clear to yellow sputum. Pt has hx of mitral valve replacement but denies hx of MI.   Pt also reports feeling disoriented and weak this morning while getting dressed but is alert and oriented x 4 upon assessment.

## 2017-09-01 ENCOUNTER — Ambulatory Visit
Admission: RE | Admit: 2017-09-01 | Discharge: 2017-09-01 | Disposition: A | Discharge status: Home / Self Care | Payer: Medicare HMO | Source: Ambulatory Visit | Attending: Internal Medicine | Admitting: Internal Medicine

## 2017-09-01 DIAGNOSIS — I4891 Unspecified atrial fibrillation: Secondary | ICD-10-CM | POA: Insufficient documentation

## 2017-09-01 DIAGNOSIS — R000 Tachycardia, unspecified: Secondary | ICD-10-CM | POA: Insufficient documentation

## 2017-09-02 ENCOUNTER — Telehealth: Payer: Self-pay | Admitting: Cardiovascular Disease

## 2017-09-02 NOTE — Telephone Encounter (Signed)
Patient has a question about CHF (if he has this dx)   Please call

## 2017-09-05 NOTE — Telephone Encounter (Signed)
Patient returning call.  Please call wife .

## 2017-09-05 NOTE — Telephone Encounter (Signed)
Spoke with patients wife per release form and she states that someone was supposed to be calling them with a plan of care for his high heart rates. She reports that his heart rates remain elevated and just wanted to check and see if Dr. [name] and Dr. Caryl Comes have spoken about him yet. Let her know that I would forward this message over to Dr. Olin Pia nurse to see if she is aware about anything and that we would be in touch. She was appreciative for the call and had no further questions at this time.

## 2017-09-05 NOTE — Telephone Encounter (Signed)
Waiting for Dr. Caryl Comes to sign off on the patient's 48 hour holter, although this did not get forwarded to his in-basket for review.   It has been scanned in the patient's chart.

## 2017-09-05 NOTE — Telephone Encounter (Signed)
No voicemail box is set up.

## 2017-09-06 MED ORDER — AMIODARONE HCL 200 MG PO TABS: 400.0000 mg | ORAL_TABLET | Freq: Every day | ORAL | 3 refills | Status: DC

## 2017-09-06 MED ORDER — AMIODARONE HCL 200 MG PO TABS: 400.0000 mg | ORAL_TABLET | ORAL | 0 refills | Status: DC

## 2017-09-06 MED ORDER — FUROSEMIDE 40 MG PO TABS: 40.0000 mg | ORAL_TABLET | ORAL | 6 refills | Status: DC

## 2017-09-06 NOTE — Telephone Encounter (Deleted)
Patient

## 2017-09-06 NOTE — Telephone Encounter (Signed)
Patient scheduled for BMET in 2 weeks   1 m fu appt with Caryl Comes not available.  Please advise where to add on patient

## 2017-09-06 NOTE — Telephone Encounter (Signed)
Dr. Caryl Comes spoke with patient by phone and requested that we send in Amiodarone 400 mg twice daily for 2 weeks and then go to 400 mg once daily. Also sent in changes with Furosemide 40 mg once daily for 5 days and then repeat labs in 2 weeks and follow up with him in 1 month. All orders entered and will call and confirm this information with him.

## 2017-09-06 NOTE — Telephone Encounter (Signed)
OK to add on 10/04/17 at 9:00 am with Dr. Caryl Comes.

## 2017-09-06 NOTE — Telephone Encounter (Signed)
Spoke with pharmacist and verified prescriptions and directions for the medication changes. She verbalized understanding and had no further questions.

## 2017-09-06 NOTE — Addendum Note (Signed)
Addended by: Valora Corporal on: 09/06/2017 02:16 PM   Modules accepted: Orders

## 2017-09-06 NOTE — Telephone Encounter (Signed)
Spoke with patient and reviewed all medication changes with him. He verbalized understanding and had no further questions at this time. Advised him that someone would call to assist him with scheduling repeat labs and follow up with Dr. Caryl Comes. He verbalized understanding of our conversation, agreement with plan, and had no further questions at this time.

## 2017-09-06 NOTE — Telephone Encounter (Signed)
Called pt  And got no answer  HR average> 100 and with LV dysfunction, would recommend aggressive efforts at rate control, either amiodarone ( 2b indication ) or AV ablation and pacing   Will try again later

## 2017-09-06 NOTE — Telephone Encounter (Signed)
Patient returning call.  Please try again .  °

## 2017-09-09 NOTE — Telephone Encounter (Signed)
Spoke to pt on Tuesdaay regarding holter He remains averse to anticoagulation so rate control is our option Two choices 1) try amio again and see>> his choice and TG , but not anguiine 2) AV ablation and pacing  Plan will be 400 bid x 2 weeks 400 daily x 2weekds Then office visit to see what the HR is like  He has cardiomyopathy which is new and raises the sense of urgency

## 2017-09-20 ENCOUNTER — Telehealth: Payer: Self-pay | Admitting: Cardiovascular Disease

## 2017-09-20 ENCOUNTER — Encounter: Payer: Self-pay | Admitting: Medical Oncology

## 2017-09-20 ENCOUNTER — Other Ambulatory Visit: Payer: Medicare HMO

## 2017-09-20 ENCOUNTER — Emergency Department
Admission: EM | Admit: 2017-09-20 | Discharge: 2017-09-20 | Disposition: A | Discharge status: Home / Self Care | Payer: Medicare HMO | Attending: Emergency Medicine | Admitting: Emergency Medicine

## 2017-09-20 DIAGNOSIS — R55 Syncope and collapse: Secondary | ICD-10-CM | POA: Insufficient documentation

## 2017-09-20 DIAGNOSIS — F17210 Nicotine dependence, cigarettes, uncomplicated: Secondary | ICD-10-CM | POA: Diagnosis not present

## 2017-09-20 DIAGNOSIS — I5032 Chronic diastolic (congestive) heart failure: Secondary | ICD-10-CM | POA: Insufficient documentation

## 2017-09-20 DIAGNOSIS — I2510 Atherosclerotic heart disease of native coronary artery without angina pectoris: Secondary | ICD-10-CM | POA: Insufficient documentation

## 2017-09-20 DIAGNOSIS — Z8673 Personal history of transient ischemic attack (TIA), and cerebral infarction without residual deficits: Secondary | ICD-10-CM | POA: Diagnosis not present

## 2017-09-20 DIAGNOSIS — J45909 Unspecified asthma, uncomplicated: Secondary | ICD-10-CM | POA: Diagnosis not present

## 2017-09-20 DIAGNOSIS — J449 Chronic obstructive pulmonary disease, unspecified: Secondary | ICD-10-CM | POA: Diagnosis not present

## 2017-09-20 DIAGNOSIS — I110 Hypertensive heart disease with heart failure: Secondary | ICD-10-CM | POA: Insufficient documentation

## 2017-09-20 LAB — URINALYSIS, COMPLETE (UACMP) WITH MICROSCOPIC
Bacteria, UA: NONE SEEN
Bilirubin Urine: NEGATIVE
Glucose, UA: NEGATIVE mg/dL
Ketones, ur: NEGATIVE mg/dL
Nitrite: NEGATIVE
Protein, ur: NEGATIVE mg/dL
Specific Gravity, Urine: 1.012 (ref 1.005–1.030)
Squamous Epithelial / LPF: NONE SEEN
pH: 5 (ref 5.0–8.0)

## 2017-09-20 LAB — HEPATIC FUNCTION PANEL
ALT: 15 U/L — ABNORMAL LOW (ref 17–63)
AST: 19 U/L (ref 15–41)
Albumin: 4 g/dL (ref 3.5–5.0)
Alkaline Phosphatase: 68 U/L (ref 38–126)
Bilirubin, Direct: 0.1 mg/dL (ref 0.1–0.5)
Indirect Bilirubin: 0.9 mg/dL (ref 0.3–0.9)
Total Bilirubin: 1 mg/dL (ref 0.3–1.2)
Total Protein: 7.3 g/dL (ref 6.5–8.1)

## 2017-09-20 LAB — BASIC METABOLIC PANEL
Anion gap: 9 (ref 5–15)
BUN: 9 mg/dL (ref 6–20)
CO2: 25 mmol/L (ref 22–32)
Calcium: 9.3 mg/dL (ref 8.9–10.3)
Chloride: 106 mmol/L (ref 101–111)
Creatinine, Ser: 1.23 mg/dL (ref 0.61–1.24)
GFR calc Af Amer: >60 mL/min (ref >60)
GFR calc non Af Amer: 56 mL/min — ABNORMAL LOW (ref >60)
Glucose, Bld: 106 mg/dL — ABNORMAL HIGH (ref 65–99)
Potassium: 3.4 mmol/L — ABNORMAL LOW (ref 3.5–5.1)
Sodium: 140 mmol/L (ref 135–145)

## 2017-09-20 LAB — CBC
HCT: 45.4 % (ref 40.0–52.0)
Hemoglobin: 15.5 g/dL (ref 13.0–18.0)
MCH: 31 pg (ref 26.0–34.0)
MCHC: 34.2 g/dL (ref 32.0–36.0)
MCV: 90.6 fL (ref 80.0–100.0)
Platelets: 183 10*3/uL (ref 150–440)
RBC: 5.01 MIL/uL (ref 4.40–5.90)
RDW: 13.9 % (ref 11.5–14.5)
WBC: 5 10*3/uL (ref 3.8–10.6)

## 2017-09-20 LAB — TROPONIN I
Troponin I: <0.03 ng/mL (ref <0.03)
Troponin I: <0.03 ng/mL (ref <0.03)

## 2017-09-20 LAB — DIGOXIN LEVEL: Digoxin Level: <0.2 ng/mL — ABNORMAL LOW (ref 0.8–2.0)

## 2017-09-20 MED ORDER — SODIUM CHLORIDE 0.9 % IV SOLN
1000.0000 mL | Freq: Once | INTRAVENOUS | Status: AC
  Administered 2017-09-20: 1000 mL via INTRAVENOUS

## 2017-09-20 NOTE — Telephone Encounter (Signed)
Patient in lobby for labs with clinic today   He stood up and has some dizziness.

## 2017-09-20 NOTE — ED Notes (Signed)
Pt verbalizes understanding of discharge and follow-up instructions. Pt states that he feels well enough to go home and follow-up with his cardiologist instead of being admitted here. Pt verbalizes that he will come back to the ER if his symptoms increase in severity or he feels he needs follow-up care before he can get to his cardiologist. Pt A&O x4. Skin warm and dry.

## 2017-09-20 NOTE — ED Provider Notes (Signed)
heart failure clinic nurse will touch base with the patient about scheduling outpatient heart failure clinic follow-up.   Michael Newport, MD 09/20/17 [phone]

## 2017-09-20 NOTE — Telephone Encounter (Signed)
Pt in the office today for labs. He was sitting in the lobby and states as he stood up to return a magazine, he got dizzy and "would have fallen if I wasn't standing beside a wall". He then reported numbness and tingling bilateral arms, face feels "like it is drawing up" , and a jolt of pain in his left upper arm and traveling to his armpit. States he has never felt like this before. Pt still symptomatic. Pt has hx of afib and TIA.  Advised pt I would need to transport him to ER for further evaluation. Pt agreeable. Transported pt to ER via wheelchair. Pt's wife notified and will proceed there.

## 2017-09-20 NOTE — Progress Notes (Signed)
Cardiology Office Note  Date:  09/21/2017   ID:  Michael Delacruz, DOB [birth year], MRN [id number]  PCP:  Patient, No Pcp Per   Chief Complaint  Patient presents with  . other    Follow up [facility] ER; dizziness. Meds reviewed by the pt. verbally. Pt. c/o balance being off.     HPI:  Michael Delacruz is a 76 year old gentleman with  long smoking history for 50 years with underlying COPD,  severe mitral valve regurgitation on echocardiogram,  prolapse of posterior leaflet,  moderate pulmonary hypertension  s/p successful MR repair by right thoracotomy by Dr. Roxy Manns 11/ 2016 Smokes 1 ppd PAF on warfarin had bleeding, now on asa, s/p Maze Surgical report indicates clipping of left atrial appendage, Atricure left atrial clip, size 45 mm Previous history of retinal bleeding felt exacerbated by warfarin , requiring surgery 2 residual right eye  vision deficits Atrial flutter Who presents for follow up of his MR repair and atrial flutter  Dizzy epsiode coming in for blood draw in our office Fell into wall, Dizzy, hand swelling, felt face got "sucked in" Symptoms resolved without intervention Was told in the emergency room he may have had a TIA Discharged home  Continues to have partial vision right eye  does not want anticoagulation out of fear of losing rest of his  vision in the right eye Currently able to drive  Feels that his breathing has improved, recently having good days   feels the amiodarone is working to slow his heart rate Scheduled to decrease the dose down to 200 mg twice a day later this week   denies any chest pain   Previously with stress at home  Living in a trailer, some financial issues On his last visit was not working  EKG on today's visit shows atrial flutter with rate 110 bpm  Orthostatics checked in the office today, no significant change in heart rate ranging from 103 up to 112, no significant change in blood pressure around 120 over 80s  Other past medical  history reviewed History of bleeding in his right eye  On warfarin, INR was greater than 5 Seen by ophthalmology, symptoms  Improved back to his baseline  started on aspirin and Plavix, had neurologic event with weakness in his hand  Was told in the emergency room he was in atrial fibrillation ( review EKG actually showed normal sinus rhythm)  Warfarin was restarted for TIA symptoms  Had recurrent   bleedingsymptoms when he restarted warfarin   he started eliquis  For stroke prevention Was taking eliquis  2.5 mill grams twice a day for 2 days then went up to 5 mg daily  Over the phone we had suggested go to 5 mg twice a day  Hospitalizationt 2016 at [facility] for paroxysmal atrial fibrillation. After rate control, he had TEE cardioversion and was discharged on diltiazem, and coagulation.  Previously had bradycardia on metoprolol was taking 12.5 mg in the morning  Previous dramatic improvement in his weight and diet with drop of his cholesterol from 240 to 170. Cholesterol now back up to 260. He refused cholesterol pill  Repeat echocardiogram 2013 shows mildly dilated left ventricle at end systole, left ventricular size is less than 4 cm in diastole,   normal LV function estimated at >60%, moderate valve prolapse of the posterior leaflet with moderate to severe mitral valve regurgitation that is the centric and directed towards the septum, right ventricular systolic pressure estimated at 50 mm mercury  He  had a cardiac catheterization  that showed severe mitral valve regurgitation, 30% mid and 30% proximal LAD disease, 50% mid circumflex disease ejection fraction 50%.  PMH:   has a past medical history of Arthritis; Asthma; Bradycardia; CAD in native artery; Chronic diastolic congestive heart failure (HCC); COPD (chronic obstructive pulmonary disease) ([place]); Dilation of intestine (01/2015); Fibromyalgia; GERD (gastroesophageal reflux disease); History of blood clots; History of hiatal hernia;  History of rheumatic fever; Hypertension; Kidney stone; PAF (paroxysmal atrial fibrillation) ([place]); S/P Minimally invasive maze operation for atrial fibrillation (10/22/2015); S/P minimally invasive mitral valve repair (10/22/2015); Severe mitral regurgitation; Stroke [facility]); TIA (transient ischemic attack); and Tobacco abuse.  PSH:    Past Surgical History:  Procedure Laterality Date  . ANKLE SURGERY    . CARDIAC CATHETERIZATION N/A 10/03/2015   Procedure: Right and Left Heart Cath and Coronary Angiography;  Surgeon: [name], MD;  Location: [place] [facility];  Service: Cardiovascular;  Laterality: N/A;  . COLONOSCOPY    . ELECTROPHYSIOLOGIC STUDY N/A 08/18/2015   Procedure: CARDIOVERSION;  Surgeon: Wellington Hampshire, MD;  Location: [facility];  Service: Cardiovascular;  Laterality: N/A;  . MINIMALLY INVASIVE MAZE PROCEDURE N/A 10/22/2015   Procedure: MINIMALLY INVASIVE MAZE PROCEDURE;  Surgeon: Rexene Alberts, MD;  Location: [place];  Service: Open Heart Surgery;  Laterality: N/A;  . MITRAL VALVE REPAIR Right 10/22/2015   Procedure: MINIMALLY INVASIVE MITRAL VALVE REPAIR (MVR);  Surgeon: Rexene Alberts, MD;  Location: [place];  Service: Open Heart Surgery;  Laterality: Right;  . SINUS EXPLORATION    . TEE WITHOUT CARDIOVERSION N/A 08/18/2015   Procedure: TRANSESOPHAGEAL ECHOCARDIOGRAM (TEE);  Surgeon: Wellington Hampshire, MD;  Location: [facility] OR[facility];  Service: Cardiovascular;  Laterality: N/A;  . TEE WITHOUT CARDIOVERSION N/A 10/22/2015   Procedure: TRANSESOPHAGEAL ECHOCARDIOGRAM (TEE);  Surgeon: Rexene Alberts, MD;  Location: [place];  Service: Open Heart Surgery;  Laterality: N/A;    Current Outpatient Prescriptions  Medication Sig Dispense Refill  . amiodarone (PACERONE) 200 MG tablet Take 2 tablets (400 mg total) by mouth as directed. 2 tablets twice daily for 2 weeks then 2 tablets once daily 88 tablet 0  . aspirin EC 81 MG tablet Take 1 tablet (81 mg total) by mouth daily.    . furosemide  (LASIX) 40 MG tablet Take 1 tablet (40 mg total) by mouth as directed. 1 tablet daily for 5 days then go to every other day (Patient taking differently: Take 40 mg by mouth daily. ) 30 tablet 6  . oxyCODONE-acetaminophen (ROXICET) 5-325 MG tablet Take 1 tablet by mouth every 6 (six) hours as needed. 20 tablet 0   No current facility-administered medications for this visit.      Allergies:   Codeine; Macrodantin [nitrofurantoin macrocrystal]; and Morphine and related   Social History:  The patient  reports that he has been smoking Cigarettes.  He has a 26.00 pack-year smoking history. He has never used smokeless tobacco. He reports that he does not drink alcohol or use drugs.   Family History:   family history includes Hypertension in his other.    Review of Systems: Review of Systems  Constitutional: Negative.   Respiratory: Negative.   Cardiovascular: Negative.        Left chest pain  Gastrointestinal: Negative.   Musculoskeletal: Negative.   Neurological: Positive for dizziness and sensory change.  Psychiatric/Behavioral: Negative.   All other systems reviewed and are negative.    PHYSICAL EXAM: VS:  BP 106/82 (BP  Location: Left Arm, Patient Position: Sitting, Cuff Size: Normal)   Pulse (!) 110   Ht 5\' 10"  (1.778 m)   Wt 203 lb 8 oz (92.3 kg)   BMI 29.20 kg/m  , BMI Body mass index is 29.2 kg/m. GEN: Well nourished, well developed, Mild distress though comfortable  HEENT: normal  Neck: no JVD, carotid bruits, or masses Cardiac:  regular, tachycardic no murmurs, rubs, or gallops,no edema  Respiratory:  clear to auscultation bilaterally, normal work of breathing GI: soft, nontender, nondistended, + BS MS: no deformity or atrophy  Skin: warm and dry, no rash Neuro:  Strength and sensation are intact Psych: euthymic mood, full affect    Recent Labs: 07/26/2017: TSH 5.310 09/20/2017: ALT 15; BUN 9; Creatinine, Ser 1.23; Hemoglobin 15.5; Platelets 183; Potassium 3.4;  Sodium 140    Lipid Panel Lab Results  Component Value Date   CHOL 169 12/06/2015   HDL 36 (L) 12/06/2015   LDLCALC 116 (H) 12/06/2015   TRIG 85 12/06/2015      Wt Readings from Last 3 Encounters:  09/21/17 203 lb 8 oz (92.3 kg)  09/20/17 200 lb (90.7 kg)  08/31/17 202 lb (91.6 kg)       ASSESSMENT AND PLAN:  Paroxysmal atrial fibrillation ([place]) - Plan: EKG 12-Lead Continues to be in atrial flutter   Atrial flutter Rate controlled with amiodarone   feels better with rate 100 up to 110 Able to walk without shortness of breath Reports having some good days recently Does not want anticoagulation. He is aware of risk of stroke Recent visit to the emergency room concerning for TIA .he is aware of this   Coronary artery disease involving native coronary artery of native heart without angina pectoris - Plan: EKG 12-Lead Currently with no symptoms of angina. No further workup at this time. In the past did not want a cholesterol medication   Chronic diastolic congestive heart failure (HCC) - Plan: EKG 12-Lead Currently on Lasix 40 daily   occasional shortness of breath in the middle of the night, likely sleep apnea but suggested if symptoms persist he could take extra Lasix after lunch (twice a day dosing when necessary)  Pure hypercholesterolemia He does not want cholesterol medication ,  Had myalgias in the past   S/P MVR (mitral valve repair) He has been taking aspirin,  He does not want anticoagulation given  history of retinal bleedings  He is aware of risk and benefit   Centrilobular emphysema ([place]) He is still smoking . He will continue to work on this and does not want any assistance with chantix.   Depression Living in a trailer, financial issues  mood seems better today    Total encounter time more than 45 minutes  Greater than 50% was spent in counseling and coordination of care with the patient  Disposition:      Orders Placed This Encounter   Procedures  . EKG 12-Lead     Signed, Esmond Plants, M.D., Ph.D. 09/21/2017  [place], [place]

## 2017-09-20 NOTE — ED Provider Notes (Signed)
[facility] Emergency Department Provider Note       Time seen: ----------------------------------------- 10:26 AM on 09/20/2017 -----------------------------------------     I have reviewed the triage vital signs and the nursing notes.   HISTORY   Chief Complaint Loss of Consciousness    HPI Michael Delacruz is a 76 y.o. male who presents to the ED for near syncope. Patient was at the cardiologist's office this morning when he had a near syncopal event and fell onto the wall. Patient noticed this the most when he is standing or trying to walk. Patient states prior to that he felt tingly all over and both his hands went numb. He arrives alert and oriented denying any pain. He reports having 2 cups of coffee but not eating this morning. He takes Lasix daily and the dose has been unchanged.  Past Medical History:  Diagnosis Date  . Arthritis   . Asthma   . Bradycardia   . CAD in native artery    a. LHC 09/2015: 40% pCx, 35% mRCA.  [name] Chronic diastolic congestive heart failure ([place])   . COPD (chronic obstructive pulmonary disease) ([place])   . Dilation of intestine 01/2015  . Fibromyalgia   . GERD (gastroesophageal reflux disease)   . History of blood clots    eye   . History of hiatal hernia   . History of rheumatic fever   . Hypertension   . Kidney stone   . PAF (paroxysmal atrial fibrillation) ([place])    a. s/p TEE/DCCV 07/2015. b. H/o bleeding on Coumadin when INR >5, changed to Eliquis.  . S/P Minimally invasive maze operation for atrial fibrillation 10/22/2015   Complete bilateral atrial lesion set using cryothermy and bipolar radiofrequency ablation with clipping of LA appendage via right mini thoracotomy approach  . S/P minimally invasive mitral valve repair 10/22/2015   Complex valvuloplasty including triangular resection of posterior leaflet, artificial Gore-tex neochord placement x6 and 38 mm Sorin Memo 3D Rechord ring annuloplasty via right  minithoracotomy approach  . Severe mitral regurgitation    a. s/p MV repair 10/2015.  . Stroke ([place])   . TIA (transient ischemic attack)   . Tobacco abuse     Patient Active Problem List   Diagnosis Date Noted  . Vision loss 12/31/2015  . TIA (transient ischemic attack) 12/06/2015  . S/P MVR (mitral valve repair) 11/11/2015  . S/P Minimally invasive maze operation for atrial fibrillation 10/22/2015  . Angina pectoris ([place]) 10/03/2015  . Chronic diastolic congestive heart failure ([place])   . Atrial fibrillation ([place]) 08/15/2015  . Atrial fibrillation with RVR ([place])   . Centrilobular emphysema ([place])   . Shortness of breath   . Hypotension 11/02/2012  . Stress at home 11/02/2012  . Leg pain 11/02/2012  . Smoking 04/27/2012  . Hyperlipidemia 04/27/2012  . CAD (coronary artery disease) 04/27/2012  . Heart palpitations 09/03/2011  . Dizziness 05/19/2011  . SOB (shortness of breath) 05/05/2011  . MVP (mitral valve prolapse) 05/05/2011  . Mitral valve regurgitation 05/05/2011  . COPD (chronic obstructive pulmonary disease) ([place]) 05/05/2011  . Pulmonary HTN ([place]) 05/05/2011  . CHEST PAIN UNSPECIFIED 01/04/2011    Past Surgical History:  Procedure Laterality Date  . ANKLE SURGERY    . CARDIAC CATHETERIZATION N/A 10/03/2015   Procedure: Right and Left Heart Cath and Coronary Angiography;  Surgeon: [name], MD;  Location: [place] [facility];  Service: Cardiovascular;  Laterality: N/A;  . COLONOSCOPY    . ELECTROPHYSIOLOGIC  STUDY N/A 08/18/2015   Procedure: CARDIOVERSION;  Surgeon: Wellington Hampshire, MD;  Location: [facility];  Service: Cardiovascular;  Laterality: N/A;  . MINIMALLY INVASIVE MAZE PROCEDURE N/A 10/22/2015   Procedure: MINIMALLY INVASIVE MAZE PROCEDURE;  Surgeon: Rexene Alberts, MD;  Location: [place];  Service: Open Heart Surgery;  Laterality: N/A;  . MITRAL VALVE REPAIR Right 10/22/2015   Procedure: MINIMALLY INVASIVE MITRAL VALVE REPAIR (MVR);  Surgeon: Rexene Alberts, MD;  Location: [place];  Service: Open Heart Surgery;  Laterality: Right;  . SINUS EXPLORATION    . TEE WITHOUT CARDIOVERSION N/A 08/18/2015   Procedure: TRANSESOPHAGEAL ECHOCARDIOGRAM (TEE);  Surgeon: Wellington Hampshire, MD;  Location: [facility];  Service: Cardiovascular;  Laterality: N/A;  . TEE WITHOUT CARDIOVERSION N/A 10/22/2015   Procedure: TRANSESOPHAGEAL ECHOCARDIOGRAM (TEE);  Surgeon: Rexene Alberts, MD;  Location: [place];  Service: Open Heart Surgery;  Laterality: N/A;    Allergies Codeine; Macrodantin [nitrofurantoin macrocrystal]; and Morphine and related  Social History Social History  Substance Use Topics  . Smoking status: Current Every Day Smoker    Packs/day: 0.50    Years: 52.00    Types: Cigarettes  . Smokeless tobacco: Never Used  . Alcohol use No     Comment: rare    Review of Systems Constitutional: Negative for fever. Cardiovascular: Negative for chest pain. Respiratory: Negative for shortness of breath. Gastrointestinal: Negative for abdominal pain, vomiting and diarrhea. Genitourinary: Negative for dysuria. Musculoskeletal: Negative for back pain. Skin: Negative for rash. Neurological: positive for weakness, paresthesias  All systems negative/normal/unremarkable except as stated in the HPI  ____________________________________________   PHYSICAL EXAM:  VITAL SIGNS: ED Triage Vitals  Enc Vitals Group     BP 09/20/17 1003 122/84     Pulse Rate 09/20/17 1003 (!) 108     Resp 09/20/17 1003 20     Temp 09/20/17 1003 97.8 F (36.6 C)     Temp Source 09/20/17 1003 Oral     SpO2 09/20/17 1003 97 %     Weight 09/20/17 1003 200 lb (90.7 kg)     Height 09/20/17 1003 5\' 10"  (1.778 m)     Head Circumference --      Peak Flow --      Pain Score 09/20/17 1012 0     Pain Loc --      Pain Edu? --      Excl. in [place]? --     Constitutional: Alert and oriented. Well appearing and in no distress. Eyes: Conjunctivae are normal. Normal extraocular  movements. ENT   Head: Normocephalic and atraumatic.   Nose: No congestion/rhinnorhea.   Mouth/Throat: Mucous membranes are moist.   Neck: No stridor. Cardiovascular: rapid rate, regular rhythm. No murmurs, rubs, or gallops. Respiratory: Normal respiratory effort without tachypnea nor retractions. Breath sounds are clear and equal bilaterally. No wheezes/rales/rhonchi. Gastrointestinal: Soft and nontender. Normal bowel sounds Musculoskeletal: Nontender with normal range of motion in extremities. No lower extremity tenderness nor edema. Neurologic:  Normal speech and language. No gross focal neurologic deficits are appreciated.  Skin:  Skin is warm, dry and intact. No rash noted. Psychiatric: Mood and affect are normal. Speech and behavior are normal.  ____________________________________________  EKG: Interpreted by me.junctional tachycardia with a rate of 109 bpm, normal QRS size, PVC, long QT  ____________________________________________  ED COURSE:  Pertinent labs & imaging results that were available during my care of the patient were reviewed by me and considered in my medical decision making (see  chart for details). Patient presents for near-syncope, we will assess with labs and imaging as indicated. patient appeared orthostatic clinically and we will check this as well as to give gentle IV fluids.    Procedures ____________________________________________   LABS (pertinent positives/negatives)  Labs Reviewed  BASIC METABOLIC PANEL - Abnormal; Notable for the following:       Result Value   Potassium 3.4 (*)    Glucose, Bld 106 (*)    GFR calc non Af Amer 56 (*)    All other components within normal limits  URINALYSIS, COMPLETE (UACMP) WITH MICROSCOPIC - Abnormal; Notable for the following:    Color, Urine YELLOW (*)    APPearance HAZY (*)    Hgb urine dipstick SMALL (*)    Leukocytes, UA MODERATE (*)    All other components within normal limits  HEPATIC  FUNCTION PANEL - Abnormal; Notable for the following:    ALT 15 (*)    All other components within normal limits  CBC  TROPONIN I  TROPONIN I  DIGOXIN LEVEL  CBG MONITORING, ED   ____________________________________________  DIFFERENTIAL DIAGNOSIS   near syncope, dehydration, hypoglycemia, dysrhythmia  FINAL ASSESSMENT AND PLAN  near-syncope   Plan: Patient's labs and imaging were dictated above. Patient had presented for near syncope uncertain etiology. He is likely dehydrated and has been taking Lasix daily for mild congestive heart failure. He is also on maximized therapy for dysrhythmia. His heart rate and blood pressure are improved compared to prior and currently he is feeling better after a liter of fluids. I did discuss with Dr. [name] who agrees with discharge and close outpatient follow-up.   Earleen Newport, MD   Note: This note was generated in part or whole with voice recognition software. Voice recognition is usually quite accurate but there are transcription errors that can and very often do occur. I apologize for any typographical errors that were not detected and corrected.     Earleen Newport, MD 09/20/17 [phone]

## 2017-09-20 NOTE — ED Triage Notes (Signed)
Pt was at Dr Elwyn Reach office this am when he had a syncopal episode and fell into the wall. Pt states that prior to that he felt tingly all over and both hands went numb. Pt A/O in triage denying any pain at this time.

## 2017-09-21 ENCOUNTER — Encounter: Payer: Self-pay | Admitting: Cardiovascular Disease

## 2017-09-21 ENCOUNTER — Ambulatory Visit (INDEPENDENT_AMBULATORY_CARE_PROVIDER_SITE_OTHER): Payer: Medicare HMO | Admitting: Cardiovascular Disease

## 2017-09-21 VITALS — BP 106/82 | HR 110 | Ht 70.0 in | Wt 203.5 lb

## 2017-09-21 DIAGNOSIS — J432 Centrilobular emphysema: Secondary | ICD-10-CM

## 2017-09-21 DIAGNOSIS — I340 Nonrheumatic mitral (valve) insufficiency: Secondary | ICD-10-CM | POA: Diagnosis not present

## 2017-09-21 DIAGNOSIS — I4891 Unspecified atrial fibrillation: Secondary | ICD-10-CM

## 2017-09-21 DIAGNOSIS — I272 Pulmonary hypertension, unspecified: Secondary | ICD-10-CM | POA: Diagnosis not present

## 2017-09-21 DIAGNOSIS — E7800 Pure hypercholesterolemia, unspecified: Secondary | ICD-10-CM

## 2017-09-21 DIAGNOSIS — I25118 Atherosclerotic heart disease of native coronary artery with other forms of angina pectoris: Secondary | ICD-10-CM

## 2017-09-21 DIAGNOSIS — R0602 Shortness of breath: Secondary | ICD-10-CM | POA: Diagnosis not present

## 2017-09-21 MED ORDER — FUROSEMIDE 40 MG PO TABS: 40.0000 mg | ORAL_TABLET | Freq: Two times a day (BID) | ORAL | 6 refills | Status: DC | PRN

## 2017-09-21 NOTE — Patient Instructions (Signed)

## 2017-10-04 ENCOUNTER — Ambulatory Visit (INDEPENDENT_AMBULATORY_CARE_PROVIDER_SITE_OTHER): Payer: Medicare HMO | Admitting: Internal Medicine

## 2017-10-04 ENCOUNTER — Other Ambulatory Visit: Payer: Self-pay | Admitting: Internal Medicine

## 2017-10-04 ENCOUNTER — Encounter: Payer: Self-pay | Admitting: Internal Medicine

## 2017-10-04 VITALS — BP 120/80 | HR 109 | Ht 70.0 in | Wt 196.5 lb

## 2017-10-04 DIAGNOSIS — E032 Hypothyroidism due to medicaments and other exogenous substances: Secondary | ICD-10-CM | POA: Diagnosis not present

## 2017-10-04 DIAGNOSIS — I428 Other cardiomyopathies: Secondary | ICD-10-CM

## 2017-10-04 DIAGNOSIS — E876 Hypokalemia: Secondary | ICD-10-CM | POA: Diagnosis not present

## 2017-10-04 DIAGNOSIS — I482 Chronic atrial fibrillation, unspecified: Secondary | ICD-10-CM

## 2017-10-04 MED ORDER — AMIODARONE HCL 200 MG PO TABS: 200.0000 mg | ORAL_TABLET | Freq: Every day | ORAL | 5 refills | Status: DC

## 2017-10-04 NOTE — Progress Notes (Signed)
Patient Care Team: Patient, No Pcp Per as PCP - General (General Practice) [name], Kathlene November, MD as Consulting Physician (Cardiology)   HPI  Michael Delacruz is a 76 y.o. male seen in follow-up for persistent/permanent atrial fibrillation/flutter with a rapid ventricular response and interval deterioration of LV systolic function   DATE TEST    8/16    Echo   EF 55-60 % Severe MR 2/2 post leaflet prolapse  12/16    Echo   EF 55-60 % Mild Aortic root dilitation MR mild  8/18 Echo EF       He was disinclined because of his eyes to undergo anticoagulation and cardioversion even for a short period of time.Hence, we elected to use amiodarone as a class IIb indication for rate control as a potential alternative to AV junction ablation and pacing  Was also started on diuretics for heart failure  He is not taking it as needed  His atrial fibrillation is bothering him less. He has less shortness of breath.  No interval bleeding.   Records and Results Reviewed Holter>>>Mean HR 123       Range HR 95-138  .  Date Cr K TSH ALT     10/18 1.23 3.4 5.31(8/18)  12              Past Medical History:  Diagnosis Date  . Arthritis   . Asthma   . Bradycardia   . CAD in native artery    a. LHC 09/2015: 40% pCx, 35% mRCA.  [name] Chronic diastolic congestive heart failure ([place])   . COPD (chronic obstructive pulmonary disease) ([place])   . Dilation of intestine 01/2015  . Fibromyalgia   . GERD (gastroesophageal reflux disease)   . History of blood clots    eye   . History of hiatal hernia   . History of rheumatic fever   . Hypertension   . Kidney stone   . PAF (paroxysmal atrial fibrillation) ([place])    a. s/p TEE/DCCV 07/2015. b. H/o bleeding on Coumadin when INR >5, changed to Eliquis.  . S/P Minimally invasive maze operation for atrial fibrillation 10/22/2015   Complete bilateral atrial lesion set using cryothermy and bipolar radiofrequency ablation with clipping of LA  appendage via right mini thoracotomy approach  . S/P minimally invasive mitral valve repair 10/22/2015   Complex valvuloplasty including triangular resection of posterior leaflet, artificial Gore-tex neochord placement x6 and 38 mm Sorin Memo 3D Rechord ring annuloplasty via right minithoracotomy approach  . Severe mitral regurgitation    a. s/p MV repair 10/2015.  . Stroke ([place])   . TIA (transient ischemic attack)   . Tobacco abuse     Past Surgical History:  Procedure Laterality Date  . ANKLE SURGERY    . CARDIAC CATHETERIZATION N/A 10/03/2015   Procedure: Right and Left Heart Cath and Coronary Angiography;  Surgeon: [name], MD;  Location: [place] [facility];  Service: Cardiovascular;  Laterality: N/A;  . COLONOSCOPY    . ELECTROPHYSIOLOGIC STUDY N/A 08/18/2015   Procedure: CARDIOVERSION;  Surgeon: Wellington Hampshire, MD;  Location: [facility];  Service: Cardiovascular;  Laterality: N/A;  . MINIMALLY INVASIVE MAZE PROCEDURE N/A 10/22/2015   Procedure: MINIMALLY INVASIVE MAZE PROCEDURE;  Surgeon: Rexene Alberts, MD;  Location: [place];  Service: Open Heart Surgery;  Laterality: N/A;  . MITRAL VALVE REPAIR Right 10/22/2015   Procedure: MINIMALLY INVASIVE MITRAL VALVE REPAIR (MVR);  Surgeon: Rexene Alberts, MD;  [facility];  Service: Open Heart Surgery;  Laterality: Right;  . SINUS EXPLORATION    . TEE WITHOUT CARDIOVERSION N/A 08/18/2015   Procedure: TRANSESOPHAGEAL ECHOCARDIOGRAM (TEE);  Surgeon: Wellington Hampshire, MD;  Location: [facility];  Service: Cardiovascular;  Laterality: N/A;  . TEE WITHOUT CARDIOVERSION N/A 10/22/2015   Procedure: TRANSESOPHAGEAL ECHOCARDIOGRAM (TEE);  Surgeon: Rexene Alberts, MD;  Location: [place];  Service: Open Heart Surgery;  Laterality: N/A;    Current Outpatient Prescriptions  Medication Sig Dispense Refill  . amiodarone (PACERONE) 200 MG tablet Take 200 mg by mouth 2 (two) times daily.    [name] aspirin EC 81 MG tablet Take 1 tablet (81 mg total) by  mouth daily.    . furosemide (LASIX) 40 MG tablet Take 1 tablet (40 mg total) by mouth 2 (two) times daily as needed. 60 tablet 6  . oxyCODONE-acetaminophen (ROXICET) 5-325 MG tablet Take 1 tablet by mouth every 6 (six) hours as needed. 20 tablet 0   No current facility-administered medications for this visit.     Allergies  Allergen Reactions  . Codeine Nausea Only  . Digoxin And Related     SOB, bad dreams  . Macrodantin [Nitrofurantoin Macrocrystal] Rash  . Morphine And Related Rash      Review of Systems negative except from HPI and PMH  Physical Exam BP 120/80 (BP Location: Left Arm, Patient Position: Sitting, Cuff Size: Normal)   Pulse (!) 109   Ht 5\' 10"  (1.778 m)   Wt 196 lb 8 oz (89.1 kg)   BMI 28.19 kg/m  Well developed and well nourished in no acute distress HENT normal E scleral and icterus clear Neck Supple JVP flat; carotids brisk and full Clear to ausculation irregularly irregular And rapi  no murmurs gallops or rub Soft with active bowel sounds No clubbing cyanosis  Edema Alert and oriented, grossly normal motor and sensory function Skin Warm and Dry  ECG AFib 102 -/11/39   Assessment and  Plan  Congestive heart failure  Acute/chronic    Atrial flutter RVR-- improved  Hx of MAZE and AtriaClip  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  We will plan a Holter monitor 8 weeks.  If heart rates are reasonably controlled we will plan an ultrasound after that and I will see him in 3 months.  Potassium was low at his last evaluation; we will recheck it today.  TSH was elevated prior to the initiation of amiodarone; I worry about drug-induced hypothyroidism  We have stopped aspirin as his only  medication was atrial fibrillation.  I am not sure that is not worse than  taking nothing-based [place] registry  He had questions about drug interactions with amiodarone and antibiotics and we reviewed this literature   Current medicines are reviewed at length with the patient today .  The patient does not  have concerns regarding medicines.

## 2017-10-04 NOTE — Patient Instructions (Addendum)
Medication Instructions:  Your physician has recommended you make the following change in your medication:  STOP taking aspirin In 4 weeks, DECREASE amiodarone to 200mg  once daily   Labwork: BMET and TSH today  Testing/Procedures: Your physician has recommended that you wear a 24 hour holter monitor in 8 weeks. Holter monitors are medical devices that record the heart's electrical activity. Doctors most often use these monitors to diagnose arrhythmias. Arrhythmias are problems with the speed or rhythm of the heartbeat. The monitor is a small, portable device. You can wear one while you do your normal daily activities. This is usually used to diagnose what is causing palpitations/syncope (passing out).    Follow-Up: Your physician recommends that you schedule a follow-up appointment in: 3 months with Dr. Caryl Comes   Any Other Special Instructions Will Be Listed Below (If Applicable).     If you need a refill on your cardiac medications before your next appointment, please call your pharmacy.   Holter Monitoring A Holter monitor is a small device that is used to detect abnormal heart rhythms. It clips to your clothing and is connected by wires to flat, sticky disks (electrodes) that attach to your chest. It is worn continuously for 24-48 hours. Follow these instructions at home:  Wear your Holter monitor at all times, even while exercising and sleeping, for as long as directed by your health care provider.  Make sure that the Holter monitor is safely clipped to your clothing or close to your body as recommended by your health care provider.  Do not get the monitor or wires wet.  Do not put body lotion or moisturizer on your chest.  Keep your skin clean.  Keep a diary of your daily activities, such as walking and doing chores. If you feel that your heartbeat is abnormal or that your heart is fluttering or skipping a beat: ? Record what you are doing when it happens. ? Record what  time of day the symptoms occur.  Return your Holter monitor as directed by your health care provider.  Keep all follow-up visits as directed by your health care provider. This is important. Get help right away if:  You feel lightheaded or you faint.  You have trouble breathing.  You feel pain in your chest, upper arm, or jaw.  You feel sick to your stomach and your skin is pale, cool, or damp.  You heartbeat feels unusual or abnormal. This information is not intended to replace advice given to you by your health care provider. Make sure you discuss any questions you have with your health care provider. Document Released: 09/03/2004 Document Revised: 05/13/2016 Document Reviewed: 07/15/2014 Elsevier Interactive Patient Education  [employer].

## 2017-10-05 LAB — BASIC METABOLIC PANEL
BUN/Creatinine Ratio: 8 — ABNORMAL LOW (ref 10–24)
BUN: 12 mg/dL (ref 8–27)
CO2: 24 mmol/L (ref 20–29)
Calcium: 9.9 mg/dL (ref 8.6–10.2)
Chloride: 99 mmol/L (ref 96–106)
Creatinine, Ser: 1.44 mg/dL — ABNORMAL HIGH (ref 0.76–1.27)
GFR calc Af Amer: 55 mL/min/{1.73_m2} — ABNORMAL LOW (ref >59)
GFR calc non Af Amer: 47 mL/min/{1.73_m2} — ABNORMAL LOW (ref >59)
Glucose: 113 mg/dL — ABNORMAL HIGH (ref 65–99)
Potassium: 3.9 mmol/L (ref 3.5–5.2)
Sodium: 142 mmol/L (ref 134–144)

## 2017-10-05 LAB — TSH: TSH: 9.97 u[IU]/mL — ABNORMAL HIGH (ref 0.450–4.500)

## 2017-10-10 ENCOUNTER — Telehealth: Payer: Self-pay

## 2017-10-10 DIAGNOSIS — R7989 Other specified abnormal findings of blood chemistry: Secondary | ICD-10-CM

## 2017-10-10 MED ORDER — LEVOTHYROXINE SODIUM 25 MCG PO TABS
25.0000 ug | ORAL_TABLET | Freq: Every day | ORAL | 3 refills | Status: DC
Start: 2017-10-10 — End: 2017-12-29

## 2017-10-10 NOTE — Telephone Encounter (Signed)
her request Synthroid 25 mcg will be sent to [facility] with instructions take 1 tab by mouth daily. She assured me she would relay message to her husband. I also encouraged them to be actively looking for PCP which patient doesn't currently have. She is agreeable. Recheck will be 11/07/17. We are both agreeable.

## 2017-10-31 ENCOUNTER — Telehealth: Payer: Self-pay | Admitting: Internal Medicine
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
## 2017-11-01 NOTE — Telephone Encounter (Signed)
Spoke with patient and he states that he was having nightmares, not able to sleep, anxiety, and mental instability. Patient reports pain to right shoulder and left elbow. Reviewed importance of medications to help with his heart but he just is not able to tolerate the feelings and night problems. He is adamant about not taking these due to not tolerating them. Reviewed upcoming appointments and he had no further questions. He was not aware of the lab appointment so will route to scheduling to assist with getting that done here in our office. He was appreciative for the call and advised him to call back if he should have any further questions or concerns.

## 2017-11-01 NOTE — Telephone Encounter (Signed)
Moved lab appointment to same day as [place] monitor placement. Notified patient

## 2017-11-07 ENCOUNTER — Other Ambulatory Visit: Payer: Medicare HMO

## 2017-11-29 ENCOUNTER — Other Ambulatory Visit: Payer: Medicare HMO

## 2017-12-05 ENCOUNTER — Other Ambulatory Visit (INDEPENDENT_AMBULATORY_CARE_PROVIDER_SITE_OTHER): Payer: Medicare HMO

## 2017-12-05 ENCOUNTER — Ambulatory Visit (INDEPENDENT_AMBULATORY_CARE_PROVIDER_SITE_OTHER): Payer: Medicare HMO

## 2017-12-05 DIAGNOSIS — R7989 Other specified abnormal findings of blood chemistry: Secondary | ICD-10-CM | POA: Diagnosis not present

## 2017-12-05 DIAGNOSIS — I482 Chronic atrial fibrillation, unspecified: Secondary | ICD-10-CM

## 2017-12-06 ENCOUNTER — Encounter: Payer: Self-pay | Admitting: Internal Medicine

## 2017-12-06 LAB — TSH: TSH: 8.71 u[IU]/mL — ABNORMAL HIGH (ref 0.450–4.500)

## 2017-12-06 NOTE — Progress Notes (Signed)
Patient brings in a data sheet from 12/13 with heart rates in the 110s.  This does not bode well for the ability to control heart rate with amiodarone.  It is our presumption that he is still taking amiodarone although this will need to be clarified.  Holter monitor is pending.  I suspect we will end up with AV ablation and pacing.

## 2017-12-07 ENCOUNTER — Ambulatory Visit
Admission: RE | Admit: 2017-12-07 | Discharge: 2017-12-07 | Disposition: A | Discharge status: Home / Self Care | Payer: Medicare HMO | Source: Ambulatory Visit | Attending: Internal Medicine | Admitting: Internal Medicine

## 2017-12-07 DIAGNOSIS — I482 Chronic atrial fibrillation: Secondary | ICD-10-CM | POA: Diagnosis present

## 2017-12-08 ENCOUNTER — Telehealth: Payer: Self-pay

## 2017-12-08 NOTE — Telephone Encounter (Signed)
lmtcb for lab results and schedule appt

## 2017-12-09 NOTE — Telephone Encounter (Signed)
Mr. Mcquiston is returning a call . Thanks

## 2017-12-09 NOTE — Telephone Encounter (Signed)
Informed patient of TSH results.  Holter monitor results did not look finished yet. I will send a staff message to Dr. Olin Pia scheduler to have pt return in about 1 month per result notes.

## 2017-12-14 ENCOUNTER — Telehealth: Payer: Self-pay | Admitting: Internal Medicine

## 2017-12-14 NOTE — Telephone Encounter (Signed)
Patient called to request results of his holter. Informed patient that Dr. Caryl Comes has yet to send results to Nursing. He understands he will be called with results and Dr. Olin Pia recommendations when available.

## 2017-12-14 NOTE — Telephone Encounter (Signed)
Pt would like to know if 24 hr holtor monitor results were back from 12-07-17-pls advise

## 2017-12-19 ENCOUNTER — Encounter: Payer: Self-pay | Admitting: *Deleted

## 2017-12-23 ENCOUNTER — Telehealth: Payer: Self-pay | Admitting: Internal Medicine

## 2017-12-23 NOTE — Telephone Encounter (Signed)
The patient is aware of his results- he has stopped amiodarone due to nightmares.  He is advised to keep his follow up on 01/17/18 with Dr. Caryl Comes.

## 2017-12-29 ENCOUNTER — Ambulatory Visit (INDEPENDENT_AMBULATORY_CARE_PROVIDER_SITE_OTHER): Payer: Medicare HMO | Admitting: Internal Medicine

## 2017-12-29 ENCOUNTER — Encounter: Payer: Self-pay | Admitting: Internal Medicine

## 2017-12-29 VITALS — BP 110/88 | HR 113 | Ht 70.0 in | Wt 206.2 lb

## 2017-12-29 DIAGNOSIS — I5032 Chronic diastolic (congestive) heart failure: Secondary | ICD-10-CM

## 2017-12-29 DIAGNOSIS — I482 Chronic atrial fibrillation, unspecified: Secondary | ICD-10-CM

## 2017-12-29 DIAGNOSIS — I4892 Unspecified atrial flutter: Secondary | ICD-10-CM | POA: Diagnosis not present

## 2017-12-29 NOTE — Progress Notes (Signed)
Patient Care Team: Patient, No Pcp Per as PCP - General (General Practice) [name], Kathlene November, MD as Consulting Physician (Cardiology)   HPI  Michael Delacruz is a 77 y.o. male seen in follow-up for persistent/permanent atrial fibrillation/flutter with a rapid ventricular response and interval deterioration of LV systolic function   DATE TEST    8/16    Echo   EF 55-60 % Severe MR 2/2 post leaflet prolapse  12/16    Echo   EF 55-60 % Mild Aortic root dilitation MR mild  8/18 Echo EF 45-50%      He has been disinclined because of his eyes to undergo anticoagulation and cardioversion even for a short period of time .Hence, we elected to use amiodarone as a class IIb indication for rate control as a potential alternative to AV junction ablation and pacing   intercurrently  amio has been discontinued because of nightmares.    DATE TEST   9/18 Holter      Mean 123 (94-135)  12/18 Holter Mean HR 112 (94-135)          Date Cr K TSH ALT     10/18 1.23 3.4 5.31(8/18)  12  10/18 1..44 3.9 9.9>>8.7    He has complaints of increasing shortness of breath and fatigue.  There is some peripheral edema. He comes in today adamant that he wants to take no more medicine except maybe his Lasix.  He also has a history of recurrent severe abdominal discomfort that may be abating since he discontinued amiodarone     Past Medical History:  Diagnosis Date  . Arthritis   . Asthma   . Bradycardia   . CAD in native artery    a. LHC 09/2015: 40% pCx, 35% mRCA.  [name] Chronic diastolic congestive heart failure ([place])   . COPD (chronic obstructive pulmonary disease) ([place])   . Dilation of intestine 01/2015  . Fibromyalgia   . GERD (gastroesophageal reflux disease)   . History of blood clots    eye   . History of hiatal hernia   . History of rheumatic fever   . Hypertension   . Kidney stone   . PAF (paroxysmal atrial fibrillation) ([place])    a. s/p TEE/DCCV 07/2015. b. H/o bleeding on  Coumadin when INR >5, changed to Eliquis.  . S/P Minimally invasive maze operation for atrial fibrillation 10/22/2015   Complete bilateral atrial lesion set using cryothermy and bipolar radiofrequency ablation with clipping of LA appendage via right mini thoracotomy approach  . S/P minimally invasive mitral valve repair 10/22/2015   Complex valvuloplasty including triangular resection of posterior leaflet, artificial Gore-tex neochord placement x6 and 38 mm Sorin Memo 3D Rechord ring annuloplasty via right minithoracotomy approach  . Severe mitral regurgitation    a. s/p MV repair 10/2015.  . Stroke ([place])   . TIA (transient ischemic attack)   . Tobacco abuse     Past Surgical History:  Procedure Laterality Date  . ANKLE SURGERY    . CARDIAC CATHETERIZATION N/A 10/03/2015   Procedure: Right and Left Heart Cath and Coronary Angiography;  Surgeon: [name], MD;  Location: [facility];  Service: Cardiovascular;  Laterality: N/A;  . COLONOSCOPY    . ELECTROPHYSIOLOGIC STUDY N/A 08/18/2015   Procedure: CARDIOVERSION;  Surgeon: Wellington Hampshire, MD;  Location: [facility];  Service: Cardiovascular;  Laterality: N/A;  . MINIMALLY INVASIVE MAZE PROCEDURE N/A 10/22/2015   Procedure: MINIMALLY INVASIVE MAZE PROCEDURE;  Surgeon: Rexene Alberts, MD;  Location: [place];  Service: Open Heart Surgery;  Laterality: N/A;  . MITRAL VALVE REPAIR Right 10/22/2015   Procedure: MINIMALLY INVASIVE MITRAL VALVE REPAIR (MVR);  Surgeon: Rexene Alberts, MD;  Location: [place];  Service: Open Heart Surgery;  Laterality: Right;  . SINUS EXPLORATION    . TEE WITHOUT CARDIOVERSION N/A 08/18/2015   Procedure: TRANSESOPHAGEAL ECHOCARDIOGRAM (TEE);  Surgeon: Wellington Hampshire, MD;  Location: [facility];  Service: Cardiovascular;  Laterality: N/A;  . TEE WITHOUT CARDIOVERSION N/A 10/22/2015   Procedure: TRANSESOPHAGEAL ECHOCARDIOGRAM (TEE);  Surgeon: Rexene Alberts, MD;  Location: [place];  Service: Open Heart Surgery;   Laterality: N/A;    Current Outpatient Medications  Medication Sig Dispense Refill  . furosemide (LASIX) 40 MG tablet Take 1 tablet (40 mg total) by mouth 2 (two) times daily as needed. 60 tablet 6   No current facility-administered medications for this visit.     Allergies  Allergen Reactions  . Codeine Nausea Only  . Digoxin And Related     SOB, bad dreams  . Macrodantin [Nitrofurantoin Macrocrystal] Rash  . Morphine And Related Rash      Review of Systems negative except from HPI and PMH  Physical Exam BP 110/88 (BP Location: Left Arm, Patient Position: Sitting, Cuff Size: Normal)   Pulse (!) 113   Ht 5\' 10"  (1.778 m)   Wt 206 lb 4 oz (93.6 kg)   BMI 29.59 kg/m  Well developed and nourished in no acute distress HENT normal Neck supple with JVP-8 +HJR Carotids brisk and full without bruits Clear Irregularly irregular rate and rhythm withrapid ventricular response, no murmurs or gallops Abd-soft with active BS without hepatomegaly there is mild residual diffuse tenderness No Clubbing cyanosis edema Skin-warm and dry A & Oriented  Grossly normal sensory and motor function   ECG atrial flutter at 113 bpm  Assessment and  Plan  Congestive heart failure  Acute/chronic  -worsening  Atrial flutter RVR--    Hx of MAZE and AtriaClip  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  He is off the amiodarone.  We will need to follow his TSH to make sure it normalizes.  His worsening dyspnea may be multifactorial, but a leading contributing factor is undoubtedly his worsening heart failure manifested by worsening JVD.  His LV function has been deteriorating.  We have exhausted medical options for rate control.  As he is not willing to take anticoagulation he does not have options for rhythm control.  Hence, I have recommended that he consider AV junction ablation and pacing.  We  will plan to regroup in about 6 weeks and consider this again.  I worry a little bit that his functional deterioration over recent months we will continue.  More than 50% of 40 min was spent in counseling related to the above

## 2017-12-29 NOTE — Patient Instructions (Signed)
Medication Instructions: - Your physician recommends that you continue on your current medications as directed. Please refer to the Current Medication list given to you today.  Labwork: - none ordered  Procedures/Testing: - none ordered  Follow-Up: - Your physician recommends that you schedule a follow-up appointment in: 6 weeks with Dr. Caryl Comes.   Any Additional Special Instructions Will Be Listed Below (If Applicable).     If you need a refill on your cardiac medications before your next appointment, please call your pharmacy.

## 2018-01-16 ENCOUNTER — Emergency Department: Payer: Medicare HMO

## 2018-01-16 ENCOUNTER — Emergency Department
Admission: EM | Admit: 2018-01-16 | Discharge: 2018-01-16 | Disposition: A | Discharge status: Home / Self Care | Payer: Medicare HMO | Attending: Emergency Medicine | Admitting: Emergency Medicine

## 2018-01-16 ENCOUNTER — Ambulatory Visit: Payer: Medicare HMO | Admitting: Internal Medicine

## 2018-01-16 DIAGNOSIS — E039 Hypothyroidism, unspecified: Secondary | ICD-10-CM | POA: Insufficient documentation

## 2018-01-16 DIAGNOSIS — F17210 Nicotine dependence, cigarettes, uncomplicated: Secondary | ICD-10-CM | POA: Insufficient documentation

## 2018-01-16 DIAGNOSIS — G4700 Insomnia, unspecified: Secondary | ICD-10-CM | POA: Insufficient documentation

## 2018-01-16 DIAGNOSIS — R51 Headache: Secondary | ICD-10-CM | POA: Diagnosis not present

## 2018-01-16 DIAGNOSIS — K5900 Constipation, unspecified: Secondary | ICD-10-CM | POA: Insufficient documentation

## 2018-01-16 DIAGNOSIS — J449 Chronic obstructive pulmonary disease, unspecified: Secondary | ICD-10-CM | POA: Diagnosis not present

## 2018-01-16 DIAGNOSIS — R109 Unspecified abdominal pain: Secondary | ICD-10-CM | POA: Diagnosis present

## 2018-01-16 DIAGNOSIS — F329 Major depressive disorder, single episode, unspecified: Secondary | ICD-10-CM | POA: Insufficient documentation

## 2018-01-16 DIAGNOSIS — H538 Other visual disturbances: Secondary | ICD-10-CM | POA: Diagnosis not present

## 2018-01-16 DIAGNOSIS — I110 Hypertensive heart disease with heart failure: Secondary | ICD-10-CM | POA: Insufficient documentation

## 2018-01-16 DIAGNOSIS — I509 Heart failure, unspecified: Secondary | ICD-10-CM | POA: Diagnosis not present

## 2018-01-16 DIAGNOSIS — Z9119 Patient's noncompliance with other medical treatment and regimen: Secondary | ICD-10-CM | POA: Diagnosis not present

## 2018-01-16 DIAGNOSIS — F451 Undifferentiated somatoform disorder: Secondary | ICD-10-CM | POA: Diagnosis not present

## 2018-01-16 DIAGNOSIS — F330 Major depressive disorder, recurrent, mild: Secondary | ICD-10-CM

## 2018-01-16 DIAGNOSIS — R0602 Shortness of breath: Secondary | ICD-10-CM | POA: Diagnosis not present

## 2018-01-16 DIAGNOSIS — Z91199 Patient's noncompliance with other medical treatment and regimen due to unspecified reason: Secondary | ICD-10-CM

## 2018-01-16 DIAGNOSIS — J45909 Unspecified asthma, uncomplicated: Secondary | ICD-10-CM | POA: Diagnosis not present

## 2018-01-16 DIAGNOSIS — F32A Depression, unspecified: Secondary | ICD-10-CM

## 2018-01-16 DIAGNOSIS — F331 Major depressive disorder, recurrent, moderate: Secondary | ICD-10-CM

## 2018-01-16 LAB — CBC
HCT: 45.2 % (ref 40.0–52.0)
Hemoglobin: 14.9 g/dL (ref 13.0–18.0)
MCH: 31.2 pg (ref 26.0–34.0)
MCHC: 33 g/dL (ref 32.0–36.0)
MCV: 94.5 fL (ref 80.0–100.0)
Platelets: 157 10*3/uL (ref 150–440)
RBC: 4.79 MIL/uL (ref 4.40–5.90)
RDW: 14.3 % (ref 11.5–14.5)
WBC: 5.1 10*3/uL (ref 3.8–10.6)

## 2018-01-16 LAB — URINALYSIS, COMPLETE (UACMP) WITH MICROSCOPIC
Bacteria, UA: NONE SEEN
Bilirubin Urine: NEGATIVE
Glucose, UA: NEGATIVE mg/dL
Ketones, ur: NEGATIVE mg/dL
Leukocytes, UA: NEGATIVE
Nitrite: NEGATIVE
Protein, ur: NEGATIVE mg/dL
Specific Gravity, Urine: 1.019 (ref 1.005–1.030)
Squamous Epithelial / LPF: NONE SEEN
pH: 5 (ref 5.0–8.0)

## 2018-01-16 LAB — TSH: TSH: 9.751 u[IU]/mL — ABNORMAL HIGH (ref 0.350–4.500)

## 2018-01-16 LAB — HEPATIC FUNCTION PANEL
ALT: 11 U/L — ABNORMAL LOW (ref 17–63)
AST: 19 U/L (ref 15–41)
Albumin: 4 g/dL (ref 3.5–5.0)
Alkaline Phosphatase: 67 U/L (ref 38–126)
Bilirubin, Direct: 0.1 mg/dL (ref 0.1–0.5)
Indirect Bilirubin: 0.4 mg/dL (ref 0.3–0.9)
Total Bilirubin: 0.5 mg/dL (ref 0.3–1.2)
Total Protein: 7 g/dL (ref 6.5–8.1)

## 2018-01-16 LAB — LIPASE, BLOOD: Lipase: 35 U/L (ref 11–51)

## 2018-01-16 LAB — BASIC METABOLIC PANEL
Anion gap: 10 (ref 5–15)
BUN: 12 mg/dL (ref 6–20)
CO2: 23 mmol/L (ref 22–32)
Calcium: 9.2 mg/dL (ref 8.9–10.3)
Chloride: 105 mmol/L (ref 101–111)
Creatinine, Ser: 1.12 mg/dL (ref 0.61–1.24)
GFR calc Af Amer: >60 mL/min (ref >60)
GFR calc non Af Amer: >60 mL/min (ref >60)
Glucose, Bld: 120 mg/dL — ABNORMAL HIGH (ref 65–99)
Potassium: 4.2 mmol/L (ref 3.5–5.1)
Sodium: 138 mmol/L (ref 135–145)

## 2018-01-16 LAB — BRAIN NATRIURETIC PEPTIDE: B Natriuretic Peptide: 250 pg/mL — ABNORMAL HIGH (ref 0.0–100.0)

## 2018-01-16 LAB — TROPONIN I
Troponin I: <0.03 ng/mL (ref <0.03)
Troponin I: <0.03 ng/mL (ref <0.03)

## 2018-01-16 MED ORDER — DOCUSATE SODIUM 100 MG PO CAPS: 100.0000 mg | ORAL_CAPSULE | Freq: Every day | ORAL | 2 refills | Status: DC | PRN

## 2018-01-16 MED ORDER — POLYETHYLENE GLYCOL 3350 17 G PO PACK: 17.0000 g | PACK | Freq: Every day | ORAL | 0 refills | Status: DC

## 2018-01-16 NOTE — Discharge Instructions (Signed)
Return to the emergency room for any new or worrisome symptoms.  Please start taking all of your medications including amiodarone, Lasix, and your thyroid medication, follow closely with primary care doctor or referral physician as well as your primary cardiologist, if you have any new or worrisome symptoms or feel worse in any way return to the emergency room.  We also giving you a referral for a counselor, her name is Miguel Dibble, phone number is [phone], please call for help with your depression.  If you have any thoughts of hurting herself or anyone else please return to the emergency department.  Have declined Lasix here, please take it when you get home.

## 2018-01-16 NOTE — Consult Note (Signed)
[facility] Face-to-Face Psychiatry Consult   Reason for Consult: Consult for this 77 year old man who came into the emergency room with multiple medical complaints Referring Physician: McShane Patient Identification: Michael Delacruz MRN:  [id number] Principal Diagnosis: Mild recurrent major depression ([place]) Diagnosis:   Patient Active Problem List   Diagnosis Date Noted  . Mild recurrent major depression ([place]) [F33.0] 01/16/2018  . Somatic symptom disorder [F45.1] 01/16/2018  . Vision loss [H54.7] 12/31/2015  . TIA (transient ischemic attack) [G45.9] 12/06/2015  . S/P MVR (mitral valve repair) [Z98.890] 11/11/2015  . S/P Minimally invasive maze operation for atrial fibrillation [Z98.890, Z86.79] 10/22/2015  . Angina pectoris ([place]) [I20.9] 10/03/2015  . Chronic diastolic congestive heart failure ([place]) [I50.32]   . Atrial fibrillation ([place]) [I48.91] 08/15/2015  . Atrial fibrillation with RVR ([place]) [I48.91]   . Centrilobular emphysema ([place]) [J43.2]   . Shortness of breath [R06.02]   . Hypotension [I95.9] 11/02/2012  . Stress at home [F43.9] 11/02/2012  . Leg pain [M79.606] 11/02/2012  . Smoking [F17.200] 04/27/2012  . Hyperlipidemia [E78.5] 04/27/2012  . CAD (coronary artery disease) [I25.10] 04/27/2012  . Heart palpitations [R00.2] 09/03/2011  . Dizziness [R42] 05/19/2011  . SOB (shortness of breath) [R06.02] 05/05/2011  . MVP (mitral valve prolapse) [I34.1] 05/05/2011  . Mitral valve regurgitation [I34.0] 05/05/2011  . COPD (chronic obstructive pulmonary disease) ([place]) [J44.9] 05/05/2011  . Pulmonary HTN ([place]) [I27.20] 05/05/2011  . CHEST PAIN UNSPECIFIED [R07.9] 01/04/2011    Total Time spent with patient: 1 hour  Subjective:   Michael Delacruz is a 77 y.o. male patient admitted with "I just decided I want to know what is wrong with ".  HPI: Patient interviewed chart reviewed.  Case reviewed with emergency room physician.  77 year old man came to the emergency room today with  multiple medical complaints.  Among his acute complaints are abdominal discomfort, constipation, fatigue, weakness in the knees, chronic joint pain.  Patient says all of the symptoms have been present for months to years.  As it transpires, he does not have a primary care doctor and he self discontinued all of his prescribed medicines for his known medical conditions including congestive heart failure and hypothyroidism.  He says he did this because of side effects the medications cause.  For every 1 of his medicines he has a story about how it caused some kind of side effect which made him more frustrated until he decided to stop all of his medicines a couple months ago.  As far as his mood he admits that his mood feels down and negative most of the time.  He has nothing in his life he particularly enjoys.  He has nothing he is looking forward to.  His sleep is badly impaired.  He says he is awake almost every night from midnight to 5 in the morning and then sleeps a few hours scattered during the day.  His appetite is been poor.  Patient denies suicidal ideation but does appear to be somewhat hopeless and more his behavior.  Not reporting any psychotic symptoms.  He has very limited social contact.  Medical history: Patient reports that he injured his back in 1985 on the job when he lived in [place] and has never been able to go back to employment since then.  He has had chronic back pain since then.  More recently he has been diagnosed with congestive heart failure and hypothyroidism.  He is noncompliant with all of his medication.  Substance abuse history: He reports that  he used to drink heavily but quit years ago and has had only a couple of drinks in the last 2 or 3 years.  Denies any history of other drug abuse.  Social history: He is married and lives with his wife, who does work outside the home, and his adult daughter.  Patient describes his daily routine as doing very little except "piddling"  around the house.  Past Psychiatric History: Patient says that during the time he lived in [place] he was referred to see psychiatrist several times but that he thinks it was always a ploy to cheated him out of his Workmen's Compensation.  He apparently was never prescribed any antidepressants and never admitted to a psychiatric facility.  No history of suicide attempts.  He says he did see a therapist here in [place] for several years and credits them with being extremely helpful to him but for what ever reason dismisses the idea of going back to see another therapist.  He mentions having been prescribed Xanax for a few years by a physician which he found very helpful and expresses some resentment that they ultimately discontinued it.  Risk to Self:   Risk to Others:   Prior Inpatient Therapy:   Prior Outpatient Therapy:    Past Medical History:  Past Medical History:  Diagnosis Date  . Arthritis   . Asthma   . Bradycardia   . CAD in native artery    a. LHC 09/2015: 40% pCx, 35% mRCA.  [name] Chronic diastolic congestive heart failure ([place])   . COPD (chronic obstructive pulmonary disease) ([place])   . Dilation of intestine 01/2015  . Fibromyalgia   . GERD (gastroesophageal reflux disease)   . History of blood clots    eye   . History of hiatal hernia   . History of rheumatic fever   . Hypertension   . Kidney stone   . PAF (paroxysmal atrial fibrillation) ([place])    a. s/p TEE/DCCV 07/2015. b. H/o bleeding on Coumadin when INR >5, changed to Eliquis.  . S/P Minimally invasive maze operation for atrial fibrillation 10/22/2015   Complete bilateral atrial lesion set using cryothermy and bipolar radiofrequency ablation with clipping of LA appendage via right mini thoracotomy approach  . S/P minimally invasive mitral valve repair 10/22/2015   Complex valvuloplasty including triangular resection of posterior leaflet, artificial Gore-tex neochord placement x6 and 38 mm Sorin Memo 3D Rechord  ring annuloplasty via right minithoracotomy approach  . Severe mitral regurgitation    a. s/p MV repair 10/2015.  . Stroke ([place])   . TIA (transient ischemic attack)   . Tobacco abuse     Past Surgical History:  Procedure Laterality Date  . ANKLE SURGERY    . CARDIAC CATHETERIZATION N/A 10/03/2015   Procedure: Right and Left Heart Cath and Coronary Angiography;  Surgeon: [name], MD;  Location: [facility];  Service: Cardiovascular;  Laterality: N/A;  . COLONOSCOPY    . ELECTROPHYSIOLOGIC STUDY N/A 08/18/2015   Procedure: CARDIOVERSION;  Surgeon: Wellington Hampshire, MD;  Location: [facility];  Service: Cardiovascular;  Laterality: N/A;  . MINIMALLY INVASIVE MAZE PROCEDURE N/A 10/22/2015   Procedure: MINIMALLY INVASIVE MAZE PROCEDURE;  Surgeon: Rexene Alberts, MD;  Location: [facility];  Service: Open Heart Surgery;  Laterality: N/A;  . MITRAL VALVE REPAIR Right 10/22/2015   Procedure: MINIMALLY INVASIVE MITRAL VALVE REPAIR (MVR);  Surgeon: Rexene Alberts, MD;  Location: [facility];  Service: Open Heart Surgery;  Laterality:  Right;  [name] SINUS EXPLORATION    . TEE WITHOUT CARDIOVERSION N/A 08/18/2015   Procedure: TRANSESOPHAGEAL ECHOCARDIOGRAM (TEE);  Surgeon: Wellington Hampshire, MD;  Location: [facility];  Service: Cardiovascular;  Laterality: N/A;  . TEE WITHOUT CARDIOVERSION N/A 10/22/2015   Procedure: TRANSESOPHAGEAL ECHOCARDIOGRAM (TEE);  Surgeon: Rexene Alberts, MD;  Location: [facility];  Service: Open Heart Surgery;  Laterality: N/A;   Family History:  Family History  Problem Relation Age of Onset  . Stroke Mother   . Irregular heart beat Mother   . Heart murmur Brother   . Pulmonary embolism Brother   . Hypertension Other    Family Psychiatric  History: Does not know of any Social History:  Social History   Substance and Sexual Activity  Alcohol Use No  . Alcohol/week: 0.5 oz  . Types: 1 Standard drinks or equivalent per week   Comment: rare     Social History   Substance  and Sexual Activity  Drug Use No    Social History   Socioeconomic History  . Marital status: Married    Spouse name: Not on file  . Number of children: Not on file  . Years of education: Not on file  . Highest education level: Not on file  Social Needs  . Financial resource strain: Not on file  . Food insecurity - worry: Not on file  . Food insecurity - inability: Not on file  . Transportation needs - medical: Not on file  . Transportation needs - non-medical: Not on file  Occupational History  . Not on file  Tobacco Use  . Smoking status: Current Every Day Smoker    Packs/day: 0.50    Years: 52.00    Pack years: 26.00    Types: Cigarettes  . Smokeless tobacco: Never Used  Substance and Sexual Activity  . Alcohol use: No    Alcohol/week: 0.5 oz    Types: 1 Standard drinks or equivalent per week    Comment: rare  . Drug use: No  . Sexual activity: Not on file  Other Topics Concern  . Not on file  Social History Narrative  . Not on file   Additional Social History:    Allergies:   Allergies  Allergen Reactions  . Codeine Nausea Only  . Digoxin And Related     SOB, bad dreams  . Macrodantin [Nitrofurantoin Macrocrystal] Rash  . Morphine And Related Rash    Labs:  Results for orders placed or performed during the hospital encounter of 01/16/18 (from the past 48 hour(s))  Basic metabolic panel     Status: Abnormal   Collection Time: 01/16/18  1:36 PM  Result Value Ref Range   Sodium 138 135 - 145 mmol/L   Potassium 4.2 3.5 - 5.1 mmol/L   Chloride 105 101 - 111 mmol/L   CO2 23 22 - 32 mmol/L   Glucose, Bld 120 (H) 65 - 99 mg/dL   BUN 12 6 - 20 mg/dL   Creatinine, Ser 1.12 0.61 - 1.24 mg/dL   Calcium 9.2 8.9 - 10.3 mg/dL   GFR calc non Af Amer >60 >60 mL/min   GFR calc Af Amer >60 >60 mL/min    Comment: (NOTE) The eGFR has been calculated using the CKD EPI equation. This calculation has not been validated in all clinical situations. eGFR's persistently  <60 mL/min signify possible Chronic Kidney Disease.    Anion gap 10 5 - 15    Comment: Performed at [facility],  [place], [place] [address]  CBC     Status: None   Collection Time: 01/16/18  1:36 PM  Result Value Ref Range   WBC 5.1 3.8 - 10.6 K/uL   RBC 4.79 4.40 - 5.90 MIL/uL   Hemoglobin 14.9 13.0 - 18.0 g/dL   HCT 45.2 40.0 - 52.0 %   MCV 94.5 80.0 - 100.0 fL   MCH 31.2 26.0 - 34.0 pg   MCHC 33.0 32.0 - 36.0 g/dL   RDW 14.3 11.5 - 14.5 %   Platelets 157 150 - 440 K/uL    Comment: Performed at [facility], [address] [place], [place] [address]  Troponin I     Status: None   Collection Time: 01/16/18  1:36 PM  Result Value Ref Range   Troponin I <0.03 <0.03 ng/mL    Comment: Performed at [facility], [address] [place], [place] [address]  TSH     Status: Abnormal   Collection Time: 01/16/18  1:36 PM  Result Value Ref Range   TSH 9.751 (H) 0.350 - 4.500 uIU/mL    Comment: Performed by a 3rd Generation assay with a functional sensitivity of <=0.01 uIU/mL. Performed at [facility], [address] [place], [place] [address]   Brain natriuretic peptide     Status: Abnormal   Collection Time: 01/16/18  1:36 PM  Result Value Ref Range   B Natriuretic Peptide 250.0 (H) 0.0 - 100.0 pg/mL    Comment: Performed at [facility], [address] [place], [place] [address]  Hepatic function panel     Status: Abnormal   Collection Time: 01/16/18  1:36 PM  Result Value Ref Range   Total Protein 7.0 6.5 - 8.1 g/dL   Albumin 4.0 3.5 - 5.0 g/dL   AST 19 15 - 41 U/L   ALT 11 (L) 17 - 63 U/L   Alkaline Phosphatase 67 38 - 126 U/L   Total Bilirubin 0.5 0.3 - 1.2 mg/dL   Bilirubin, Direct 0.1 0.1 - 0.5 mg/dL   Indirect Bilirubin 0.4 0.3 - 0.9 mg/dL    Comment: Performed at [facility], [address] [place], [place] [address]  Lipase, blood     Status: None   Collection Time: 01/16/18  1:36 PM   Result Value Ref Range   Lipase 35 11 - 51 U/L    Comment: Performed at [facility], [address] [place], [place] [address]  Urinalysis, Complete w Microscopic     Status: Abnormal   Collection Time: 01/16/18  6:46 PM  Result Value Ref Range   Color, Urine YELLOW (A) YELLOW   APPearance CLEAR (A) CLEAR   Specific Gravity, Urine 1.019 1.005 - 1.030   pH 5.0 5.0 - 8.0   Glucose, UA NEGATIVE NEGATIVE mg/dL   Hgb urine dipstick SMALL (A) NEGATIVE   Bilirubin Urine NEGATIVE NEGATIVE   Ketones, ur NEGATIVE NEGATIVE mg/dL   Protein, ur NEGATIVE NEGATIVE mg/dL   Nitrite NEGATIVE NEGATIVE   Leukocytes, UA NEGATIVE NEGATIVE   RBC / HPF 0-5 0 - 5 RBC/hpf   WBC, UA 0-5 0 - 5 WBC/hpf   Bacteria, UA NONE SEEN NONE SEEN   Squamous Epithelial / LPF NONE SEEN NONE SEEN   Mucus PRESENT     Comment: Performed at [facility], [address], [place], [place] [address]  Troponin I     Status: None   Collection Time: 01/16/18  7:02  PM  Result Value Ref Range   Troponin I <0.03 <0.03 ng/mL    Comment: Performed at [facility], [address] [place], [place] [address]    No current facility-administered medications for this encounter.    Current Outpatient Medications  Medication Sig Dispense Refill  . furosemide (LASIX) 40 MG tablet Take 1 tablet (40 mg total) by mouth 2 (two) times daily as needed. (Patient taking differently: Take 40 mg by mouth daily. ) 60 tablet 6    Musculoskeletal: Strength & Muscle Tone: within normal limits Gait & Station: normal Patient leans: N/A  Psychiatric Specialty Exam: Physical Exam  Nursing note and vitals reviewed. Constitutional: He appears well-developed and well-nourished.  HENT:  Head: Normocephalic and atraumatic.  Eyes: Conjunctivae are normal. Pupils are equal, round, and reactive to light.  Neck: Normal range of motion.  Cardiovascular: Regular rhythm and normal heart sounds.  Respiratory: Effort normal.  No respiratory distress.  GI: Soft.  Musculoskeletal: Normal range of motion.  Neurological: He is alert.  Skin: Skin is warm and dry.  Psychiatric: His speech is normal and behavior is normal. Thought content is not paranoid. Cognition and memory are normal. He expresses inappropriate judgment. He exhibits a depressed mood. He expresses no homicidal and no suicidal ideation.    Review of Systems  Constitutional: Negative.   HENT: Negative.   Eyes: Negative.   Respiratory: Negative.   Cardiovascular: Negative.   Gastrointestinal: Positive for constipation.  Musculoskeletal: Positive for back pain.  Skin: Negative.   Neurological: Positive for headaches.  Psychiatric/Behavioral: Positive for depression. Negative for hallucinations, memory loss, substance abuse and suicidal ideas. The patient is nervous/anxious and has insomnia.     Blood pressure (!) 131/95, pulse (!) 116, temperature 97.6 F (36.4 C), temperature source Oral, resp. rate 16, height _0  (1.778 m), weight 93.4 kg (206 lb), SpO2 98 %.Body mass index is 29.56 kg/m.  General Appearance: Casual  Eye Contact:  Good  Speech:  Clear and Coherent  Volume:  Normal  Mood:  Dysphoric  Affect:  Congruent  Thought Process:  Goal Directed  Orientation:  Full (Time, Place, and Person)  Thought Content:  Illogical  Suicidal Thoughts:  No  Homicidal Thoughts:  No  Memory:  Immediate;   Fair Recent;   Fair Remote;   Fair  Judgement:  Impaired  Insight:  Shallow  Psychomotor Activity:  Decreased  Concentration:  Concentration: Fair  Recall:  [employer] of Knowledge:  Fair  Language:  Good  Akathisia:  No  Handed:  Right  AIMS (if indicated):     Assets:  Communication Skills Housing Social Support  ADL's:  Intact  Cognition:  WNL  Sleep:        Treatment Plan Summary: Plan 77 year old man who comes in with multiple medical complaints.  Patient aroused some concern in the emergency room physician because of how  obvious it is that he has been noncompliant with treatment despite claiming that he very much wants to get better and how irrational this behavior has been despite the fact that the patient is clearly not demented or psychotic.  I think that this man has a major depression and somatic symptom disorder and a complicated neurotic set of behaviors that have kept him from seeking appropriate help.  I have tried to convey that without arousing any opposition in him.  Patient makes it clear he has no interest in antidepressant medicine which is not surprising to me.  I tried  to convince him to see a therapist since he found that helpful in the past and I have provided him with the name address and phone number of a therapist in the community, Miguel Dibble, who I think could be of great help to him.  Case reviewed with emergency room physician.  No prescriptions were written.  Does not meet commitment criteria.  I tried to impress on the patient that I genuinely thought that with effort he could feel much better and encouraged him not to give up.  Disposition: No evidence of imminent risk to self or others at present.   Patient does not meet criteria for psychiatric inpatient admission. Supportive therapy provided about ongoing stressors. Discussed crisis plan, support from social network, calling 911, coming to the Emergency Department, and calling Suicide Hotline.  Alethia Berthold, MD 01/16/2018 8:41 PM

## 2018-01-16 NOTE — ED Notes (Signed)
RN called lab and was updated that lab tests could be added to existing blood collection.

## 2018-01-16 NOTE — ED Triage Notes (Signed)
FIRST NURSE NOTE-headache and SHOB with new dx CHF per pt. Has not seen cardiology.  Ambulatory. Declined wheel chair. Pulled for next for EKG

## 2018-01-16 NOTE — ED Triage Notes (Signed)
Patient c/o blurred vision, dizzy spells, chest palpitations, SOB, insomnia and headache. Ambulatory in triage. No respiratory distress noted. C/o abdominal and head pain at this time.

## 2018-01-16 NOTE — ED Provider Notes (Signed)
[facility] Emergency Department Provider Note  ____________________________________________   I have reviewed the triage vital signs and the nursing notes. Where available I have reviewed prior notes and, if possible and indicated, outside hospital notes.    HISTORY  Chief Complaint Abdominal Pain and Chest Pain    HPI Michael Delacruz is a 77 y.o. male history of chronic atrial flutter recent Holter monitor showed baseline heart rate of 110, has known CHF with an EF about 45% by last echo, history of noncompliance history of thyroid issue, patient stopped taking all of his medications including amiodarone 2 months ago because he was having bad dreams.  He states his been having bad dreams for a long time in the bedrooms persisted after taking himself off of all of his medications.  He did persistent taking Lasix until a few days ago.  He states he has been having trouble sleeping.  He states sometimes he has mild shortness of breath he denies chest pain he has constipation issues since going off his thyroid medication, and he has occasional headaches which is not suffering from today, he has insomnia, patient has a long history of all of these problems for a long time.  He went to see her primary care doctor but for reason specimen to him he elected to leave after they weighed him because he stated that they were going to try to take his money.  He denies any hallucinations, SI or HI, he states he just needs to be in control of his medications. He states he did have a bowel movement today he is not vomiting he states that he is producing stool but it is difficult to produce because it is hard.   Past Medical History:  Diagnosis Date  . Arthritis   . Asthma   . Bradycardia   . CAD in native artery    a. LHC 09/2015: 40% pCx, 35% mRCA.  [name] Chronic diastolic congestive heart failure ([place])   . COPD (chronic obstructive pulmonary disease) ([place])   . Dilation of  intestine 01/2015  . Fibromyalgia   . GERD (gastroesophageal reflux disease)   . History of blood clots    eye   . History of hiatal hernia   . History of rheumatic fever   . Hypertension   . Kidney stone   . PAF (paroxysmal atrial fibrillation) ([place])    a. s/p TEE/DCCV 07/2015. b. H/o bleeding on Coumadin when INR >5, changed to Eliquis.  . S/P Minimally invasive maze operation for atrial fibrillation 10/22/2015   Complete bilateral atrial lesion set using cryothermy and bipolar radiofrequency ablation with clipping of LA appendage via right mini thoracotomy approach  . S/P minimally invasive mitral valve repair 10/22/2015   Complex valvuloplasty including triangular resection of posterior leaflet, artificial Gore-tex neochord placement x6 and 38 mm Sorin Memo 3D Rechord ring annuloplasty via right minithoracotomy approach  . Severe mitral regurgitation    a. s/p MV repair 10/2015.  . Stroke ([place])   . TIA (transient ischemic attack)   . Tobacco abuse     Patient Active Problem List   Diagnosis Date Noted  . Mild recurrent major depression ([place]) 01/16/2018  . Somatic symptom disorder 01/16/2018  . Vision loss 12/31/2015  . TIA (transient ischemic attack) 12/06/2015  . S/P MVR (mitral valve repair) 11/11/2015  . S/P Minimally invasive maze operation for atrial fibrillation 10/22/2015  . Angina pectoris ([place]) 10/03/2015  . Chronic diastolic congestive heart failure ([place])   .  Atrial fibrillation ([place]) 08/15/2015  . Atrial fibrillation with RVR ([place])   . Centrilobular emphysema ([place])   . Shortness of breath   . Hypotension 11/02/2012  . Stress at home 11/02/2012  . Leg pain 11/02/2012  . Smoking 04/27/2012  . Hyperlipidemia 04/27/2012  . CAD (coronary artery disease) 04/27/2012  . Heart palpitations 09/03/2011  . Dizziness 05/19/2011  . SOB (shortness of breath) 05/05/2011  . MVP (mitral valve prolapse) 05/05/2011  . Mitral valve regurgitation 05/05/2011  . COPD (chronic  obstructive pulmonary disease) ([place]) 05/05/2011  . Pulmonary HTN ([place]) 05/05/2011  . CHEST PAIN UNSPECIFIED 01/04/2011    Past Surgical History:  Procedure Laterality Date  . ANKLE SURGERY    . CARDIAC CATHETERIZATION N/A 10/03/2015   Procedure: Right and Left Heart Cath and Coronary Angiography;  Surgeon: [name], MD;  Location: [place] [facility];  Service: Cardiovascular;  Laterality: N/A;  . COLONOSCOPY    . ELECTROPHYSIOLOGIC STUDY N/A 08/18/2015   Procedure: CARDIOVERSION;  Surgeon: Wellington Hampshire, MD;  Location: [facility];  Service: Cardiovascular;  Laterality: N/A;  . MINIMALLY INVASIVE MAZE PROCEDURE N/A 10/22/2015   Procedure: MINIMALLY INVASIVE MAZE PROCEDURE;  Surgeon: Rexene Alberts, MD;  Location: [place];  Service: Open Heart Surgery;  Laterality: N/A;  . MITRAL VALVE REPAIR Right 10/22/2015   Procedure: MINIMALLY INVASIVE MITRAL VALVE REPAIR (MVR);  Surgeon: Rexene Alberts, MD;  Location: [place];  Service: Open Heart Surgery;  Laterality: Right;  . SINUS EXPLORATION    . TEE WITHOUT CARDIOVERSION N/A 08/18/2015   Procedure: TRANSESOPHAGEAL ECHOCARDIOGRAM (TEE);  Surgeon: Wellington Hampshire, MD;  Location: [facility];  Service: Cardiovascular;  Laterality: N/A;  . TEE WITHOUT CARDIOVERSION N/A 10/22/2015   Procedure: TRANSESOPHAGEAL ECHOCARDIOGRAM (TEE);  Surgeon: Rexene Alberts, MD;  Location: [place];  Service: Open Heart Surgery;  Laterality: N/A;    Prior to Admission medications   Medication Sig Start Date End Date Taking? Authorizing Provider  furosemide (LASIX) 40 MG tablet Take 1 tablet (40 mg total) by mouth 2 (two) times daily as needed. Patient taking differently: Take 40 mg by mouth daily.  09/21/17   [name], MD    Allergies Codeine; Digoxin and related; Macrodantin [nitrofurantoin macrocrystal]; and Morphine and related  Family History  Problem Relation Age of Onset  . Stroke Mother   . Irregular heart beat Mother   . Heart murmur Brother    . Pulmonary embolism Brother   . Hypertension Other     Social History Social History   Tobacco Use  . Smoking status: Current Every Day Smoker    Packs/day: 0.50    Years: 52.00    Pack years: 26.00    Types: Cigarettes  . Smokeless tobacco: Never Used  Substance Use Topics  . Alcohol use: No    Alcohol/week: 0.5 oz    Types: 1 Standard drinks or equivalent per week    Comment: rare  . Drug use: No    Review of Systems Constitutional: No fever/chills Eyes: No visual changes. ENT: No sore throat. No stiff neck no neck pain Cardiovascular: Denies chest pain. Respiratory: + shortness of breath. Gastrointestinal:   no vomiting.  No diarrhea.  +constipation. Genitourinary: Negative for dysuria. Musculoskeletal: Negative lower extremity swelling Skin: Negative for rash. Neurological: Negative for severe headaches, focal weakness or numbness.   ____________________________________________   PHYSICAL EXAM:  VITAL SIGNS: ED Triage Vitals  Enc Vitals Group     BP 01/16/18 1330 [name])  131/95     Pulse Rate 01/16/18 1330 (!) 116     Resp 01/16/18 1330 16     Temp 01/16/18 1330 97.6 F (36.4 C)     Temp Source 01/16/18 1330 Oral     SpO2 01/16/18 1330 98 %     Weight 01/16/18 1332 206 lb (93.4 kg)     Height 01/16/18 1332 5\' 10"  (1.778 m)     Head Circumference --      Peak Flow --      Pain Score 01/16/18 1331 6     Pain Loc --      Pain Edu? --      Excl. in [place]? --     Constitutional: Alert and oriented. Well appearing and in no acute distress. Eyes: Conjunctivae are normal Head: Atraumatic HEENT: No congestion/rhinnorhea. Mucous membranes are moist.  Oropharynx non-erythematous Neck:   Nontender with no meningismus, no masses, no stridor Cardiovascular: Tachycardic rate, regular rhythm. Grossly normal heart sounds.  Good peripheral circulation. Respiratory: Normal respiratory effort.  No retractions. Lungs CTAB. Abdominal: Soft and nontender. No distention. No  guarding no rebound Back:  There is no focal tenderness or step off.  there is no midline tenderness there are no lesions noted. there is no CVA tenderness Musculoskeletal: No lower extremity tenderness, no upper extremity tenderness. No joint effusions, no DVT signs strong distal pulses no edema Neurologic:  Normal speech and language. No gross focal neurologic deficits are appreciated.  Skin:  Skin is warm, dry and intact. No rash noted. Psychiatric: Mood and affect are normal. Speech and behavior are normal.  ____________________________________________   LABS (all labs ordered are listed, but only abnormal results are displayed)  Labs Reviewed  BASIC METABOLIC PANEL - Abnormal; Notable for the following components:      Result Value   Glucose, Bld 120 (*)    All other components within normal limits  TSH - Abnormal; Notable for the following components:   TSH 9.751 (*)    All other components within normal limits  URINALYSIS, COMPLETE (UACMP) WITH MICROSCOPIC - Abnormal; Notable for the following components:   Color, Urine YELLOW (*)    APPearance CLEAR (*)    Hgb urine dipstick SMALL (*)    All other components within normal limits  BRAIN NATRIURETIC PEPTIDE - Abnormal; Notable for the following components:   B Natriuretic Peptide 250.0 (*)    All other components within normal limits  HEPATIC FUNCTION PANEL - Abnormal; Notable for the following components:   ALT 11 (*)    All other components within normal limits  CBC  TROPONIN I  LIPASE, BLOOD  TROPONIN I    Pertinent labs  results that were available during my care of the patient were reviewed by me and considered in my medical decision making (see chart for details). ____________________________________________  EKG  I personally interpreted any EKGs ordered by me or triage Atrial flutter, nonspecific ST changes including changes of ST in the lateral leads, downsloping, strain pattern likely, no acute ST elevation  or depression, ____________________________________________  RADIOLOGY  Pertinent labs & imaging results that were available during my care of the patient were reviewed by me and considered in my medical decision making (see chart for details). If possible, patient and/or family made aware of any abnormal findings.  Dg Chest 2 View  Result Date: 01/16/2018 CLINICAL DATA:  Palpitations, shortness of breath, dizziness. History of asthma-COPD, current smoker, coronary artery disease, mitral valve replacement. EXAM: CHEST  2 VIEW COMPARISON:  Chest x-ray and chest CT scan of August 31, 2017. FINDINGS: The lungs are well-expanded. The interstitial markings are increased bilaterally. The pulmonary vascularity is engorged but less prominent than on the previous study. The cardiac silhouette is enlarged. There is no pleural effusion. There is a prosthetic mitral valve ring. A left atrial appendage clip is present. There is calcification in the wall of the thoracic aorta. There is mild chronic compression of the body of approximately T8. IMPRESSION: Chronic bronchitic changes.  Mild CHF.  No alveolar pneumonia. Thoracic aortic atherosclerosis. Electronically Signed   By: David  Martinique M.D.   On: 01/16/2018 14:26   Dg Abdomen 1 View  Result Date: 01/16/2018 CLINICAL DATA:  No bowel movements in 2 weeks EXAM: ABDOMEN - 1 VIEW COMPARISON:  CT 10/01/2015 FINDINGS: Moderate stool burden throughout the colon. Gas within mildly prominent small bowel loops felt to be upper limits normal. No convincing evidence for bowel obstruction. No free air organomegaly. No suspicious calcification or acute bony abnormality. IMPRESSION: Moderate stool burden.  No acute findings. Electronically Signed   By: Rolm Baptise M.D.   On: 01/16/2018 18:57   ____________________________________________    PROCEDURES  Procedure(s) performed: None  Procedures  Critical Care performed:  None  ____________________________________________   INITIAL IMPRESSION / ASSESSMENT AND PLAN / ED COURSE  Pertinent labs & imaging results that were available during my care of the patient were reviewed by me and considered in my medical decision making (see chart for details).   difficult patient, has been noncompliant with all of his medications and is suffering the side effects.  He has mild CHF, but BNP is somewhat elevated chest x-ray shows a little fluid however sats are good and he refuses Lasix for me here.  He has good insight into the risk benefits and alternatives refusal but he states he would urinate all night.  He is constipated but his belly is benign x-ray shows constipation, I suspect this is because he is not taking his thyroid medication.  He is hyperthyroid again for the same reason.  Patient does have persistent tachycardia but he is not taking his medications.  Holter monitor from his primary cardiologist shows that he lives at around 110-120 and that is exactly where he is today.  Patient has refused to take medication for this.  In short, there are many different pathologies present most of which are easily remediable by medical science but patient prefers not to participate.  Given that he appeared somewhat depressed to me and states he is depressed even though he has no SI or HI, does not meet criteria for involuntary commitment, I did asked psychiatry to see him.  I very much appreciate the consult, psychiatry feels the patient should possibly be started on medication which she promptly refused, they also revised and gave him resources for outpatient therapy and he states he will consider it but probably will not go.  Patient has no chest pain serial cardiac enzymes are negative and I did talk to his cardiologist, Dr. [name], who understands the patient's pathology very well being his personal cardiologist.  He feels the patient should likely restart his medications and does  not feel the patient needs acute admission to the hospital.  I think this is not unreasonable given that if the patient is noncompliant with his medications a hospital stay certainly want to get again in the event I do not see any acute pathology today.  We will give him  stool softeners for his constipation I have advised him to restart his levothyroxine which he has at home restart his amiodarone which he has at home, restart his Lasix which he has at home, he will follow-up with primary care which I have strongly advised him to do and he states he will do, and I will refer him to a local primary care here although he states he would like to go somewhere near his house which is in [place].  I have also advised close outpatient follow-up with Dr. [name], and he states he will.  Very difficult patient to take care of and so far as he is refusing care although is very pleasant and polite and accommodating in all other respects, I think he does understand the risks of his decision making, and we will discharge him with close outpatient follow-up he does advised that he should return to the emergency room should he feel worse in any way he understands he can     ____________________________________________   FINAL CLINICAL IMPRESSION(S) / ED DIAGNOSES  Final diagnoses:  None      This chart was dictated using voice recognition software.  Despite best efforts to proofread,  errors can occur which can change meaning.   @   Schuyler Amor, MD 01/16/18 2043

## 2018-01-17 ENCOUNTER — Ambulatory Visit: Payer: Medicare HMO | Admitting: Internal Medicine

## 2018-01-17 ENCOUNTER — Telehealth: Payer: Self-pay | Admitting: Internal Medicine

## 2018-01-17 NOTE — Telephone Encounter (Signed)
Per ER notes from 01/16/18:  " Patient has no chest pain serial cardiac enzymes are negative and I did talk to his cardiologist, Dr. [name], who understands the patient's pathology very well being his personal cardiologist.  He feels the patient should likely restart his medications and does not feel the patient needs acute admission to the hospital.  I think this is not unreasonable given that if the patient is noncompliant with his medications a hospital stay certainly want to get again in the event I do not see any acute pathology today.  We will give him stool softeners for his constipation I have advised him to restart his levothyroxine which he has at home restart his amiodarone which he has at home, restart his Lasix which he has at home, he will follow-up with primary care which I have strongly advised him to do and he states he will do, and I will refer him to a local primary care here although he states he would like to go somewhere near his house which is in [place].  I have also advised close outpatient follow-up with Dr. [name], and he states he will."   Please call the patient to arrange follow up with Dr. [name]- ok to use a 7 day hold slot.

## 2018-01-17 NOTE — Telephone Encounter (Signed)
Pt was seen in ED on 01/16/18 Received message from [facility]  He was seen for CP  He was also just seen in office by Dr Caryl Comes 12/29/17   Please call

## 2018-01-19 NOTE — Telephone Encounter (Signed)
Pt scheduled 01/18/18  Nothing else needed.

## 2018-01-21 NOTE — Progress Notes (Signed)
Cardiology Office Note  Date:  01/24/2018   ID:  Michael Delacruz, DOB [birth year], MRN [id number]  PCP:  Patient, No Pcp Per   Chief Complaint  Patient presents with  . Other    [facility] ED follow up from 01/16/2018 for chest pain. Patient c/o chest pain, SOB and weakness. Meds reviewed verbally with patient.     HPI:  Michael Delacruz is a 77 year old gentleman with  long smoking history for 50 years with underlying COPD,  severe mitral valve regurgitation on echocardiogram,  prolapse of posterior leaflet,  moderate pulmonary hypertension  s/p successful MR repair by right thoracotomy by Dr. Roxy Manns 11/ 2016 Smokes 1 ppd PAF on warfarin had bleeding, now on asa, s/p Maze Surgical report indicates clipping of left atrial appendage, Atricure left atrial clip, size 45 mm Previous history of retinal bleeding felt exacerbated by warfarin , requiring surgery 2 residual right eye  vision deficits Atrial flutter Who presents for follow up of his MR repair and atrial flutter  Recently seen in the emergency room for general malaise, cough, sputum, abdominal pain, constipation, anxiety Hospital records reviewed with the patient in detail X-ray reviewed with him concerning for chronic bronchitis Wife presents with him today reports he is not looking good  SOB, cough worse, not sleeping well secondary to chronic cough Lungs feel tight, bronchospastic cough More productive than usual  Constipated, trying MiraLAX here and there, had some relief Still feel that he is uncomfortable, something going on his stomach  Stopped his thyroid medication on his own, now because he does not feel well He is back on thyroid medicarion TSH 9.7  Previous notes reviewed Seen by Dr. Caryl Comes December 29, 2017 Patient reported having increasing shortness of breath and fatigue, edema Only wants to take Lasix no other medications Stopped amiodarone secondary to abdominal discomfort and bad dreams  It was recommended he  consider AV junction ablation and pacing  Echocardiogram August 2018 with ejection fraction 40-45% 4 cm aortic root Was in atrial flutter during the study rate 100 up to 120 bpm  Stopped amiodarone on his own, felt it was causing side effects  EKG personally reviewed by myself on todays visit Shows atrial flutter ventricular rate 118 bpm  Other past medical history reviewed On previous office visit, he had Dizziness epsiode coming in for blood draw in our office Fell into wall, Dizzy, hand swelling, felt face got "sucked in" Symptoms resolved without intervention Was told in the emergency room he may have had a TIA Discharged home  Continues to have partial vision right eye  does not want anticoagulation out of fear of losing rest of his  vision in the right eye Currently able to drive  Previously with stress at home  Living in a trailer, some financial issues On his last visit was not working  Orthostatics checked in the office today, no significant change in heart rate ranging from 103 up to 112, no significant change in blood pressure around 120 over 80s  History of bleeding in his right eye  On warfarin, INR was greater than 5 Seen by ophthalmology, symptoms  Improved back to his baseline  started on aspirin and Plavix, had neurologic event with weakness in his hand  Was told in the emergency room he was in atrial fibrillation ( review EKG actually showed normal sinus rhythm)  Warfarin was restarted for TIA symptoms  Had recurrent   bleedingsymptoms when he restarted warfarin   he started eliquis  For stroke  prevention Was taking eliquis  2.5 mill grams twice a day for 2 days then went up to 5 mg daily  Over the phone we had suggested go to 5 mg twice a day  [facility]ospitalizationt 2016 at [facility] for paroxysmal atrial fibrillation. After rate control, he had TEE cardioversion and was discharged on diltiazem, and coagulation.  Previously had bradycardia on metoprolol was  taking 12.5 mg in the morning  Previous dramatic improvement in his weight and diet with drop of his cholesterol from 240 to 170. Cholesterol now back up to 260. He refused cholesterol pill  Repeat echocardiogram 2013 shows mildly dilated left ventricle at end systole, left ventricular size is less than 4 cm in diastole,   normal LV function estimated at >60%, moderate valve prolapse of the posterior leaflet with moderate to severe mitral valve regurgitation that is the centric and directed towards the septum, right ventricular systolic pressure estimated at 50 mm mercury  He had a cardiac catheterization  that showed severe mitral valve regurgitation, 30% mid and 30% proximal LAD disease, 50% mid circumflex disease ejection fraction 50%.  PMH:   has a past medical history of Arthritis, Asthma, Bradycardia, CAD in native artery, Chronic diastolic congestive heart failure ([place]), COPD (chronic obstructive pulmonary disease) ([place]), Dilation of intestine (01/2015), Fibromyalgia, GERD (gastroesophageal reflux disease), History of blood clots, History of hiatal hernia, History of rheumatic fever, Hypertension, Kidney stone, PAF (paroxysmal atrial fibrillation) ([place]), S/P Minimally invasive maze operation for atrial fibrillation (10/22/2015), S/P minimally invasive mitral valve repair (10/22/2015), Severe mitral regurgitation, Stroke ([place]), TIA (transient ischemic attack), and Tobacco abuse.  PSH:    Past Surgical History:  Procedure Laterality Date  . ANKLE SURGERY    . CARDIAC CATHETERIZATION N/A 10/03/2015   Procedure: Right and Left Heart Cath and Coronary Angiography;  Surgeon: [name], MD;  Location: [facility] [place] [facility];  Service: Cardiovascular;  Laterality: N/A;  . COLONOSCOPY    . ELECTROPHYSIOLOGIC STUDY N/A 08/18/2015   Procedure: CARDIOVERSION;  Surgeon: Wellington Hampshire, MD;  Location: [facility];  Service: Cardiovascular;  Laterality: N/A;  . MINIMALLY INVASIVE MAZE PROCEDURE N/A  10/22/2015   Procedure: MINIMALLY INVASIVE MAZE PROCEDURE;  Surgeon: Rexene Alberts, MD;  Location: [place];  Service: Open Heart Surgery;  Laterality: N/A;  . MITRAL VALVE REPAIR Right 10/22/2015   Procedure: MINIMALLY INVASIVE MITRAL VALVE REPAIR (MVR);  Surgeon: Rexene Alberts, MD;  Location: [place];  Service: Open Heart Surgery;  Laterality: Right;  . SINUS EXPLORATION    . TEE WITHOUT CARDIOVERSION N/A 08/18/2015   Procedure: TRANSESOPHAGEAL ECHOCARDIOGRAM (TEE);  Surgeon: Wellington Hampshire, MD;  Location: [facility];  Service: Cardiovascular;  Laterality: N/A;  . TEE WITHOUT CARDIOVERSION N/A 10/22/2015   Procedure: TRANSESOPHAGEAL ECHOCARDIOGRAM (TEE);  Surgeon: Rexene Alberts, MD;  Location: [place];  Service: Open Heart Surgery;  Laterality: N/A;    Current Outpatient Medications  Medication Sig Dispense Refill  . docusate sodium (COLACE) 100 MG capsule Take 1 capsule (100 mg total) by mouth daily as needed. 30 capsule 2  . furosemide (LASIX) 40 MG tablet Take 1 tablet (40 mg total) by mouth 2 (two) times daily as needed. (Patient taking differently: Take 40 mg by mouth daily. ) 60 tablet 6  . polyethylene glycol (MIRALAX) packet Take 17 g by mouth daily. 14 each 0  . albuterol (PROVENTIL HFA;VENTOLIN HFA) 108 (90 Base) MCG/ACT inhaler Inhale 2 puffs into the lungs every 6 (six) hours as needed for wheezing  or shortness of breath. 1 Inhaler 2  . azithromycin (ZITHROMAX) 250 MG tablet Two the first day then one a day 6 each 1  . predniSONE (DELTASONE) 20 MG tablet Take 1 tablet (20 mg total) by mouth daily with breakfast. 20 tablet 1   No current facility-administered medications for this visit.      Allergies:   Codeine; Digoxin and related; Macrodantin [nitrofurantoin macrocrystal]; and Morphine and related   Social History:  The patient  reports that he has been smoking cigarettes.  He has a 26.00 pack-year smoking history. he has never used smokeless tobacco. He reports that he does not  drink alcohol or use drugs.   Family History:   family history includes Heart murmur in his brother; Hypertension in his other; Irregular heart beat in his mother; Pulmonary embolism in his brother; Stroke in his mother.    Review of Systems: Review of Systems  Constitutional: Negative.   Respiratory: Positive for cough and wheezing.   Cardiovascular: Negative.        Left chest pain  Gastrointestinal: Positive for abdominal pain.  Musculoskeletal: Negative.   Neurological: Negative.   Psychiatric/Behavioral: Negative.   All other systems reviewed and are negative.    PHYSICAL EXAM: VS:  BP 110/83 (BP Location: Left Arm, Patient Position: Sitting, Cuff Size: Normal)   Pulse (!) 118   Ht 5\' 10"  (1.778 m)   Wt 202 lb 4 oz (91.7 kg)   BMI 29.02 kg/m  , BMI Body mass index is 29.02 kg/m. GEN: Well nourished, well developed, Mild distress though comfortable , significant coughing on today's visit HEENT: normal  Neck: no JVD, carotid bruits, or masses Cardiac:  regular, tachycardic no murmurs, rubs, or gallops,no edema  Respiratory:  clear to auscultation bilaterally, normal work of breathing GI: soft, nontender, nondistended, + BS MS: no deformity or atrophy  Skin: warm and dry, no rash Neuro:  Strength and sensation are intact Psych: euthymic mood, full affect    Recent Labs: 01/16/2018: ALT 11; B Natriuretic Peptide 250.0; BUN 12; Creatinine, Ser 1.12; Hemoglobin 14.9; Platelets 157; Potassium 4.2; Sodium 138; TSH 9.751    Lipid Panel Lab Results  Component Value Date   CHOL 169 12/06/2015   HDL 36 (L) 12/06/2015   LDLCALC 116 (H) 12/06/2015   TRIG 85 12/06/2015      Wt Readings from Last 3 Encounters:  01/24/18 202 lb 4 oz (91.7 kg)  01/16/18 206 lb (93.4 kg)  12/29/17 206 lb 4 oz (93.6 kg)       ASSESSMENT AND PLAN:  Paroxysmal atrial fibrillation ([place]) - Plan: EKG 12-Lead Continues to be in atrial flutter  Discussed AV node ablation, consideration of  pacemaker We will work on getting his lungs and stomach symptoms better Recommend he stay on his thyroid medication  Atrial flutter Rate continues to run fast, unable to tolerate amiodarone secondary to intolerance Plan as above We will work on his bronchitis first, constipation second  Coronary artery disease involving native coronary artery of native heart without angina pectoris - Plan: EKG 12-Lead Currently with no symptoms of angina. No further workup at this time. Continue current medication regimen.  Stable  Chronic diastolic congestive heart failure ([place]) - Plan: EKG 12-Lead Currently on Lasix 40 daily as needed No edema, does not feel he has fluid  Pure hypercholesterolemia He does not want cholesterol medication ,  Had myalgias in the past   Discussed with him again in detail  S/P MVR (mitral valve  repair) He has been taking aspirin,  He does not want anticoagulation given  history of retinal bleedings  Stable  Centrilobular emphysema (HCC) Recommended smoking cessation, does not want Chantix or other modalities  Acute on chronic bronchitis We will give him Z-Pak, prednisone taper, albuterol inhaler Significant productive bronchospastic cough today, unable to sleep  Depression Living in a trailer, financial issues   Total encounter time more than 45 minutes  Greater than 50% was spent in counseling and coordination of care with the patient     Orders Placed This Encounter  Procedures  . EKG 12-Lead     Signed, Esmond Plants, M.D., Ph.D. 01/24/2018  [place], [place]

## 2018-01-24 ENCOUNTER — Encounter: Payer: Self-pay | Admitting: Cardiovascular Disease

## 2018-01-24 ENCOUNTER — Ambulatory Visit (INDEPENDENT_AMBULATORY_CARE_PROVIDER_SITE_OTHER): Payer: Medicare HMO | Admitting: Cardiovascular Disease

## 2018-01-24 VITALS — BP 110/83 | HR 118 | Ht 70.0 in | Wt 202.2 lb

## 2018-01-24 DIAGNOSIS — I4892 Unspecified atrial flutter: Secondary | ICD-10-CM

## 2018-01-24 DIAGNOSIS — I428 Other cardiomyopathies: Secondary | ICD-10-CM

## 2018-01-24 DIAGNOSIS — J209 Acute bronchitis, unspecified: Secondary | ICD-10-CM | POA: Diagnosis not present

## 2018-01-24 DIAGNOSIS — I482 Chronic atrial fibrillation, unspecified: Secondary | ICD-10-CM

## 2018-01-24 DIAGNOSIS — I25118 Atherosclerotic heart disease of native coronary artery with other forms of angina pectoris: Secondary | ICD-10-CM | POA: Diagnosis not present

## 2018-01-24 DIAGNOSIS — R7989 Other specified abnormal findings of blood chemistry: Secondary | ICD-10-CM | POA: Diagnosis not present

## 2018-01-24 DIAGNOSIS — I272 Pulmonary hypertension, unspecified: Secondary | ICD-10-CM

## 2018-01-24 DIAGNOSIS — I5032 Chronic diastolic (congestive) heart failure: Secondary | ICD-10-CM | POA: Diagnosis not present

## 2018-01-24 MED ORDER — PREDNISONE 20 MG PO TABS: 20.0000 mg | ORAL_TABLET | Freq: Every day | ORAL | 1 refills | Status: DC

## 2018-01-24 MED ORDER — AZITHROMYCIN 250 MG PO TABS: ORAL_TABLET | ORAL | 1 refills | Status: DC

## 2018-01-24 MED ORDER — ALBUTEROL SULFATE HFA 108 (90 BASE) MCG/ACT IN AERS: 2.0000 | INHALATION_SPRAY | Freq: Four times a day (QID) | RESPIRATORY_TRACT | 2 refills | Status: DC | PRN

## 2018-01-24 NOTE — Patient Instructions (Addendum)
Medication Instructions:   Please take Z-pak Two pills the first day then one a day  Start prednisone 60 mg x 3 days Then 40 mg 3 days Then 20 mg x 3 days Then 10 mg 3 days  Albuterol as needed  Miralex daily with Senna/senakot Prune juice   Labwork:  No new labs needed  Testing/Procedures:  No further testing at this time   Follow-Up: It was a pleasure seeing you in the office today. Please call us if you have new issues that need to be addressed before your next appt.  [phone]  Your physician wants you to follow-up in:  As needed  If you need a refill on your cardiac medications before your next appointment, please call your pharmacy.

## 2018-01-27 ENCOUNTER — Telehealth: Payer: Self-pay | Admitting: Cardiovascular Disease

## 2018-01-27 MED ORDER — LEVOFLOXACIN 500 MG PO TABS: ORAL_TABLET | ORAL | 0 refills | Status: DC

## 2018-01-27 NOTE — Telephone Encounter (Signed)
I called and spoke with the patient. I advised him of Dr. Donivan Scull recommendation for follow up in the ER for evaluation for possible pneumonia.  I advised they will do an x-ray there for further evaluation. The patient states that he had an x-ray done recently that showed no pneumonia. I advised the patient that he could also try taking extra lasix as per Dr. [name]. He did not take lasix yesterday. He took lasix 40 mg this morning about 8 am- with not much diuresis.  I advised him to go ahead and take and extra lasix 40 mg now and that he could take lasix 40 mg BID x 3 days.   He is aware I will forward back to Dr. [name] for further advisement regarding a repeat chest x-ray since he just had once recently- unsure if he may have developed a pneumonia since that time or if he is having CHF- previous x-ray did show mild CHF.  The patient is aware I will follow up with him a little later today.  He is agreeable.

## 2018-01-27 NOTE — Telephone Encounter (Signed)
Ok to start levaquin if productive sputum, cough and if not going to the ER We can start abx without cxr if he declines cxr

## 2018-01-27 NOTE — Telephone Encounter (Signed)
Pt c/o Shortness Of Breath: STAT if SOB developed within the last 24 hours or pt is noticeably SOB on the phone  1. Are you currently SOB (can you hear that pt is SOB on the phone)? yes  2. How long have you been experiencing SOB? 1 month since sick acute since 4 am   3. Are you SOB when sitting or when up moving around? both  4. Are you currently experiencing any other symptoms? Cant lay down stand up to comfort  pain from pulling something in right side coughing and this makes pain worse pain in high shoulders   Patient requesting meds or to be admitted   Patient on hold for triage nurse

## 2018-01-27 NOTE — Telephone Encounter (Signed)
I called and spoke with the patient- he still sounds SOB over the phone- he feels he has a lot of chest congestion, but did feel a little better with extra lasix today.  I have advised him to continue lasix 40 mg BID Saturday and Sunday. He is also aware that Dr. [name] is going to send a RX for levaquin 500 mg once daily x 14 days.  This will be sent to [facility] in [place].  I have advised the patient again if his symptoms worsen over the weekend to report to the ER or Urgent Care for further evaluation. He verbalizes agreement.

## 2018-01-27 NOTE — Telephone Encounter (Signed)
He may have PNA I would recommend the ER for rx, CXR, may need IV ABX If he refuses the ER, We can order a CXR,  Then start levaquin 500 mg po daily x 14 days Lasix as well, twice a  day for 3 days

## 2018-01-27 NOTE — Telephone Encounter (Signed)
Will he stay on the prednisone and Z-pack along with levaquin?

## 2018-01-27 NOTE — Telephone Encounter (Signed)
Call transferred directly to triage- I spoke with the patient- he is audibly SOB over the phone. He was seen by Dr. [name] on 01/24/18 and given a Z-pack and prednisone along with albuterol as needed. He does not have a PCP. He states he has been up since 4 am this morning not able to sit/ lay/ stand or get comfortable in any way due to his breathing.  He is unsure if he is febrile. He reports having a coughing spell for "11 hours straight" recently. He states the cough was productive, but colorless. He reports his HR is about 117 right now.  I advised him that I am unsure if he has an infectious process going on now that is driving what he is feeling and if this may also being making his a-fib worse.   He does not have a PCP. I advised him with his audible SOB that he should report to the ER at this time. He states he does not want to do this as "I sat there for 10 hours last time and they didn't do anything." I made him aware that I have no provider in the office at this time to evaluate him and if he is SOB due to a respiratory infection, then we do not treat/ manage this. I have advised him at the least to be seen by an Urgent Care as they are now open and someone could see him. He stated he just wanted me to get in touch with Dr. [name] because he needs "medicine or for Dr. [name] to admit me."  I advised him that Dr. [name] can't admit him to [facility], but this would need to be done after evaluation in the ER under the hospitalist service.   He again asked that I contact Dr. [name]- I advised the patient that today is Dr. [name] is out of the office today and I will forward a message to him, but I do not know how long it will take him to get back with me.   I again reiterated to him that the recommendation from this office would be evaluation in the ER/ Urgent care at this time. The patient verbalizes understanding, but did not verbalize agreement to proceed either place for further  evaluation.   Message will be forwarded to Dr. [name] for review.

## 2018-02-16 ENCOUNTER — Ambulatory Visit: Payer: Medicare HMO | Admitting: Internal Medicine

## 2018-02-21 ENCOUNTER — Ambulatory Visit (INDEPENDENT_AMBULATORY_CARE_PROVIDER_SITE_OTHER): Payer: Medicare HMO | Admitting: Family Medicine

## 2018-02-21 ENCOUNTER — Ambulatory Visit (INDEPENDENT_AMBULATORY_CARE_PROVIDER_SITE_OTHER): Payer: Medicare HMO

## 2018-02-21 ENCOUNTER — Other Ambulatory Visit: Payer: Self-pay

## 2018-02-21 ENCOUNTER — Encounter: Payer: Self-pay | Admitting: Family Medicine

## 2018-02-21 VITALS — BP 122/86 | HR 117 | Temp 97.8°F | Resp 17 | Ht 70.0 in | Wt 201.0 lb

## 2018-02-21 VITALS — BP 122/86 | HR 117 | Temp 97.8°F | Ht 70.0 in | Wt 201.0 lb

## 2018-02-21 DIAGNOSIS — G4701 Insomnia due to medical condition: Secondary | ICD-10-CM | POA: Diagnosis not present

## 2018-02-21 DIAGNOSIS — F330 Major depressive disorder, recurrent, mild: Secondary | ICD-10-CM

## 2018-02-21 DIAGNOSIS — I272 Pulmonary hypertension, unspecified: Secondary | ICD-10-CM | POA: Diagnosis not present

## 2018-02-21 DIAGNOSIS — Z0000 Encounter for general adult medical examination without abnormal findings: Secondary | ICD-10-CM | POA: Diagnosis not present

## 2018-02-21 DIAGNOSIS — I5032 Chronic diastolic (congestive) heart failure: Secondary | ICD-10-CM

## 2018-02-21 DIAGNOSIS — I4891 Unspecified atrial fibrillation: Secondary | ICD-10-CM | POA: Diagnosis not present

## 2018-02-21 DIAGNOSIS — J449 Chronic obstructive pulmonary disease, unspecified: Secondary | ICD-10-CM | POA: Diagnosis not present

## 2018-02-21 MED ORDER — ALPRAZOLAM 0.5 MG PO TABS: 0.5000 mg | ORAL_TABLET | Freq: Two times a day (BID) | ORAL | 0 refills | Status: DC | PRN

## 2018-02-21 NOTE — Progress Notes (Signed)
Subjective:    Patient ID: Michael Delacruz, male    DOB: [birth year], 77 y.o.   MRN: [id number]  Michael Delacruz is a 77 y.o. male presenting on 02/21/2018 for Establish Care (shortness of breathe ) and Insomnia  Previously established with Dr Luan Pulling >3-5 years ago, since then had re-established with prior PCP and was followed Cardiology, now here to re-establish care with new PCP  History provided primarily by patient, additional history per wife, [name].  HPI   He will return for establish care visit.  COPD / Chronic Bronchitis / Dyspnea / Cough - Reports symptoms started this past summer 06/2017, describes gradual change in symptoms seemed to start with noticing difficulty walking around pool table and small distances, he would get short of breath, gradual problem worsening over past >9 months. Back in 2015 he was able to walk >3 miles a day and functioning well especially respiratory. - Now today he has concern about breathing, has followed with Cardiology extensively for multiple serious chronic co morbidities with cardiovascular system and Pulmon HTN that is he not as familiar with today. He is active smoker - He reports episodes of worsening coughing spells up to 6 to 11 hours in past - He has never seen Pulmonology, he has had prior PFTs by report with a pre op eval prior to cardiothoracic surgery few years ago - He has been prescribed medicines recently per [facility] ED on 01/16[facility]18 for other complaints and then most recently seen 01/24/18 by cardiology had acute on chronic bronchitis, he was treated with azithromycin z-pak and prednisone taper, albuterol inhaler, worsening cough keeping him awake, he had problem on these meds and also was given rx levaquin with limited relief, but eventually respiratory symptoms improved and cough improved still has chronic dyspnea - He has significant concern regarding insomnia side effect from these meds still present now weeks later - He was given  albuterol as well in past and he has not even started taking this, never on maintenance inhaler or other intervention  Insomnia Recently treated with prednisone, Azithromycin, Levaquin has had some insomnia as side effect up to 36 hours now with poor sleep unable to fall asleep, this is a long term problem for him over past >6+ months now worse after started amiodarone had side effects. In past had similar episodes but not this persistent - He has taken OTC melatonin up to 40mg  without relief by report - In past he has taken Xanax to help sleep, has used several courses, from prior PCP, Dr Ricard Dillon prescribed some related to his cardiothoracic valve surgery, he was on 0.5 to 1mg  up to 3 times daily - He is requesting course of this to help sleep now due to significant worsening - Has not been on SSRI recently or unsure if in past  Tobacco Abuse Prior quit attempt several years ago, quit for up to 1 year, cold [place], resumed due to 2nd hand smoke and being around it. Now quit again 1 week + 1 day, remains smoke free  Additional PMH per Cardiology - established at [facility] followed by Dr [name] and Dr Caryl Comes - Dx with Atrial Fibrillation (Paroxysmal) with Atrial Flutter with tachycardia, uncontrolled rate and failed amiodarone with side effects including thyroid - CAD w/o angina except has dyspnea on exertion - Chronic Diastolic CHF, prior ECHO see chart - has Lasix 40mg  daily PRN not taking regularly - HLD, HTN - S/p Mitral Valve Repair, has declined anticoagulation in past  Additional history History of CVA, lost vision in R eye 90% Constipation history, seen in ED 12/2017   Depression screen PHQ 2/9 02/21/2018  Decreased Interest 2  Down, Depressed, Hopeless 2  PHQ - 2 Score 4  Altered sleeping 3  Change in appetite 2  Feeling bad or failure about yourself  1  Trouble concentrating 1  Moving slowly or fidgety/restless 0  Suicidal thoughts 0  PHQ-9 Score 11  Difficult doing  work/chores Not difficult at all    Past Medical History:  Diagnosis Date  . Arthritis   . Asthma   . Bradycardia   . CAD in native artery    a. LHC 09/2015: 40% pCx, 35% mRCA.  [name] Chronic diastolic congestive heart failure ([place])   . COPD (chronic obstructive pulmonary disease) ([place])   . Depression   . Dilation of intestine 01/2015  . Fibromyalgia   . Frequent headaches   . GERD (gastroesophageal reflux disease)   . History of blood clots    eye   . History of hiatal hernia   . History of rheumatic fever   . Hypertension   . Kidney stone   . PAF (paroxysmal atrial fibrillation) ([place])    a. s/p TEE/DCCV 07/2015. b. H/o bleeding on Coumadin when INR >5, changed to Eliquis.  . S/P Minimally invasive maze operation for atrial fibrillation 10/22/2015   Complete bilateral atrial lesion set using cryothermy and bipolar radiofrequency ablation with clipping of LA appendage via right mini thoracotomy approach  . S/P minimally invasive mitral valve repair 10/22/2015   Complex valvuloplasty including triangular resection of posterior leaflet, artificial Gore-tex neochord placement x6 and 38 mm Sorin Memo 3D Rechord ring annuloplasty via right minithoracotomy approach  . Severe mitral regurgitation    a. s/p MV repair 10/2015.  [name] Sleep apnea   . Stroke ([place])   . Thyroid disorder   . TIA (transient ischemic attack)   . Tobacco abuse    Past Surgical History:  Procedure Laterality Date  . ANKLE SURGERY    . CARDIAC CATHETERIZATION N/A 10/03/2015   Procedure: Right and Left Heart Cath and Coronary Angiography;  Surgeon: [name], MD;  Location: [facility];  Service: Cardiovascular;  Laterality: N/A;  . COLONOSCOPY    . ELECTROPHYSIOLOGIC STUDY N/A 08/18/2015   Procedure: CARDIOVERSION;  Surgeon: Wellington Hampshire, MD;  Location: [facility];  Service: Cardiovascular;  Laterality: N/A;  . MINIMALLY INVASIVE MAZE PROCEDURE N/A 10/22/2015   Procedure: MINIMALLY INVASIVE MAZE  PROCEDURE;  Surgeon: Rexene Alberts, MD;  Location: [place];  Service: Open Heart Surgery;  Laterality: N/A;  . MITRAL VALVE REPAIR Right 10/22/2015   Procedure: MINIMALLY INVASIVE MITRAL VALVE REPAIR (MVR);  Surgeon: Rexene Alberts, MD;  Location: [place];  Service: Open Heart Surgery;  Laterality: Right;  . SINUS EXPLORATION    . TEE WITHOUT CARDIOVERSION N/A 08/18/2015   Procedure: TRANSESOPHAGEAL ECHOCARDIOGRAM (TEE);  Surgeon: Wellington Hampshire, MD;  Location: [facility];  Service: Cardiovascular;  Laterality: N/A;  . TEE WITHOUT CARDIOVERSION N/A 10/22/2015   Procedure: TRANSESOPHAGEAL ECHOCARDIOGRAM (TEE);  Surgeon: Rexene Alberts, MD;  Location: [place];  Service: Open Heart Surgery;  Laterality: N/A;   Social History   Socioeconomic History  . Marital status: Married    Spouse name: Not on file  . Number of children: Not on file  . Years of education: Not on file  . Highest education level: Not on file  Social Needs  .  Financial resource strain: Not hard at all  . Food insecurity - worry: Never true  . Food insecurity - inability: Never true  . Transportation needs - medical: No  . Transportation needs - non-medical: No  Occupational History  . Not on file  Tobacco Use  . Smoking status: Former Smoker    Packs/day: 0.50    Years: 52.00    Pack years: 26.00    Types: Cigarettes    Last attempt to quit: 02/13/2018    Years since quitting: 0.0  . Smokeless tobacco: Never Used  . Tobacco comment: Former smoker, recent quit 02/13/18 cold [place]  Substance and Sexual Activity  . Alcohol use: No    Alcohol/week: 0.5 oz    Types: 1 Standard drinks or equivalent per week    Comment: rare  . Drug use: No  . Sexual activity: Not on file  Other Topics Concern  . Not on file  Social History Narrative  . Not on file   Family History  Problem Relation Age of Onset  . Stroke Mother   . Irregular heart beat Mother   . Heart murmur Brother   . Pulmonary embolism Brother   .  Hypertension Other    Current Outpatient Medications on File Prior to Visit  Medication Sig  . furosemide (LASIX) 40 MG tablet Take 1 tablet (40 mg total) by mouth 2 (two) times daily as needed. (Patient not taking: Reported on 02/21/2018)  . albuterol (PROVENTIL HFA;VENTOLIN HFA) 108 (90 Base) MCG/ACT inhaler Inhale 2 puffs into the lungs every 6 (six) hours as needed for wheezing or shortness of breath. (Patient not taking: Reported on 02/21/2018)  . aspirin 81 MG tablet Take by mouth.  . Multiple Vitamin (MULTI-VITAMINS) TABS Take by mouth.   No current facility-administered medications on file prior to visit.     Review of Systems  Constitutional: Positive for activity change and fatigue. Negative for appetite change, chills, diaphoresis and fever.  HENT: Negative for congestion and hearing loss.   Eyes: Negative for visual disturbance.  Respiratory: Positive for cough and shortness of breath. Negative for apnea, chest tightness and wheezing.   Cardiovascular: Negative for chest pain, palpitations and leg swelling.  Gastrointestinal: Negative for abdominal pain, anal bleeding, blood in stool, constipation, diarrhea, nausea and vomiting.  Endocrine: Negative for cold intolerance.  Genitourinary: Negative for decreased urine volume, dysuria, frequency, hematuria and urgency.  Musculoskeletal: Negative for arthralgias, back pain and neck pain.  Skin: Negative for rash.  Allergic/Immunologic: Negative for environmental allergies.  Neurological: Positive for dizziness. Negative for weakness, light-headedness, numbness and headaches.  Hematological: Negative for adenopathy.  Psychiatric/Behavioral: Positive for dysphoric mood and sleep disturbance. Negative for behavioral problems, self-injury and suicidal ideas. The patient is nervous/anxious.    Per HPI unless specifically indicated above     Objective:    BP 122/86 (BP Location: Right Arm, Patient Position: Sitting, Cuff Size: Normal)    Pulse (!) 117   Temp 97.8 F (36.6 C)   Ht 5\' 10"  (1.778 m)   Wt 201 lb (91.2 kg)   BMI 28.84 kg/m   Wt Readings from Last 3 Encounters:  02/21/18 201 lb (91.2 kg)  02/21/18 201 lb (91.2 kg)  01/24/18 202 lb 4 oz (91.7 kg)    Physical Exam  Constitutional: He is oriented to person, place, and time. He appears well-developed and well-nourished. No distress.  Mostly well appearing comfortable, cooperative  HENT:  Head: Normocephalic and atraumatic.  Mouth/Throat: Oropharynx is clear  and moist.  Eyes: Conjunctivae are normal. Right eye exhibits no discharge. Left eye exhibits no discharge.  Neck: Normal range of motion. Neck supple.  Cardiovascular: Normal rate, regular rhythm, normal heart sounds and intact distal pulses.  No murmur heard. Pulmonary/Chest: Effort normal. No respiratory distress. He has no wheezes. He has no rales.  Significantly reduced air movement diffusely lungs without focal crackle wheeze or abnormality. Normal work of breathing, can take a deep breath.  Abdominal: Soft. Bowel sounds are normal. He exhibits no distension. There is no tenderness (Non reproducible mid abdominal tenderness on exam).  Musculoskeletal: Normal range of motion. He exhibits no edema.  Lymphadenopathy:    He has no cervical adenopathy.  Neurological: He is alert and oriented to person, place, and time.  Skin: Skin is warm and dry. No rash noted. He is not diaphoretic. No erythema.  Psychiatric: His behavior is normal.  Well groomed, good eye contact, normal speech and thoughts. Mood appears depressed, he is frustrated often with his chronic conditions, has some poor insight into his health and prognosis  Nursing note and vitals reviewed.  Results for orders placed or performed during the hospital encounter of 17/00/17  Basic metabolic panel  Result Value Ref Range   Sodium 138 135 - 145 mmol/L   Potassium 4.2 3.5 - 5.1 mmol/L   Chloride 105 101 - 111 mmol/L   CO2 23 22 - 32 mmol/L    Glucose, Bld 120 (H) 65 - 99 mg/dL   BUN 12 6 - 20 mg/dL   Creatinine, Ser 1.12 0.61 - 1.24 mg/dL   Calcium 9.2 8.9 - 10.3 mg/dL   GFR calc non Af Amer >60 >60 mL/min   GFR calc Af Amer >60 >60 mL/min   Anion gap 10 5 - 15  CBC  Result Value Ref Range   WBC 5.1 3.8 - 10.6 K/uL   RBC 4.79 4.40 - 5.90 MIL/uL   Hemoglobin 14.9 13.0 - 18.0 g/dL   HCT 45.2 40.0 - 52.0 %   MCV 94.5 80.0 - 100.0 fL   MCH 31.2 26.0 - 34.0 pg   MCHC 33.0 32.0 - 36.0 g/dL   RDW 14.3 11.5 - 14.5 %   Platelets 157 150 - 440 K/uL  Troponin I  Result Value Ref Range   Troponin I <0.03 <0.03 ng/mL  TSH  Result Value Ref Range   TSH 9.751 (H) 0.350 - 4.500 uIU/mL  Urinalysis, Complete w Microscopic  Result Value Ref Range   Color, Urine YELLOW (A) YELLOW   APPearance CLEAR (A) CLEAR   Specific Gravity, Urine 1.019 1.005 - 1.030   pH 5.0 5.0 - 8.0   Glucose, UA NEGATIVE NEGATIVE mg/dL   Hgb urine dipstick SMALL (A) NEGATIVE   Bilirubin Urine NEGATIVE NEGATIVE   Ketones, ur NEGATIVE NEGATIVE mg/dL   Protein, ur NEGATIVE NEGATIVE mg/dL   Nitrite NEGATIVE NEGATIVE   Leukocytes, UA NEGATIVE NEGATIVE   RBC / HPF 0-5 0 - 5 RBC/hpf   WBC, UA 0-5 0 - 5 WBC/hpf   Bacteria, UA NONE SEEN NONE SEEN   Squamous Epithelial / LPF NONE SEEN NONE SEEN   Mucus PRESENT   Brain natriuretic peptide  Result Value Ref Range   B Natriuretic Peptide 250.0 (H) 0.0 - 100.0 pg/mL  Hepatic function panel  Result Value Ref Range   Total Protein 7.0 6.5 - 8.1 g/dL   Albumin 4.0 3.5 - 5.0 g/dL   AST 19 15 - 41 U/L  ALT 11 (L) 17 - 63 U/L   Alkaline Phosphatase 67 38 - 126 U/L   Total Bilirubin 0.5 0.3 - 1.2 mg/dL   Bilirubin, Direct 0.1 0.1 - 0.5 mg/dL   Indirect Bilirubin 0.4 0.3 - 0.9 mg/dL  Lipase, blood  Result Value Ref Range   Lipase 35 11 - 51 U/L  Troponin I  Result Value Ref Range   Troponin I <0.03 <0.03 ng/mL      Assessment & Plan:   Problem List Items Addressed This Visit    Atrial fibrillation (HCC)     Chronic AFib with tachcyardia AFlutter followed by Cardiology/EP Recent discussion on pacemaker Not on anticoagulation by preference Continue on current meds      Relevant Medications   aspirin 81 MG tablet   Chronic diastolic congestive heart failure (HCC) (Chronic)    Concern with chronic diastolic CHF also as co morbid condition for his respiratory symptoms History of PAD HTN CAD Currently euvolemic, wt stable Has lasix PRN Followed by Cardiology      Relevant Medications   aspirin 81 MG tablet   COPD (chronic obstructive pulmonary disease) (HCC) - Primary (Chronic)    Concern with primary etiology for dyspnea given history of symptoms with long course of smoking, recently quit x 1 week Other comorbidities affecting his breathing including dCHF, Pulm HTN, AFib among other cardiovascular concerns HIgh risk patient No recent PFTs, previously done in 2015 or later for cardiothoracic surgical eval Not followed by Pulm Not on maintenance or rescue inhaler, has declined to use albuterol  Plan Long discussion on his progressive dyspnea and coughing, concern with limited improvement on course of antibiotics/prednisone, and his non adherence to albuterol Congratulated quitting smoking Emphasized importance of further diagnostic work-up to determine lung function, and optimal treatment, as requested per Cardiology who is trying to work on controlling his cardiac function with AFib/AFlutter and may need further surgery/procedure  Referral to [facility] for further work-up and management of moderate to severe COPD patient, he will likely need formal PFTs and oxygen desat testing ambulation / overnight, and possibly repeat lung imaging, possibly advanced image w/ CT - I advised him to start albuterol PRN that he currently has, and future will probably need maintenance inhaler      Relevant Orders   Ambulatory referral to Pulmonology   Insomnia due to medical condition     Concern with worsening insomnia recently with chronic co morbidities cardio/pulm, affecting his function and sleep In past only med helped his sleep / anxiety has been xanax, unsure which other have tried Given extensive history review today and concern that he has not slept well in 36 hours, agree for 1 month supply xanax 0.5 BID - consider dose adjust vs switch BDZ or alternative agent in future Follow-up 4 weeks      Relevant Medications   ALPRAZolam (XANAX) 0.5 MG tablet   Mild recurrent major depression (HCC)    Concern with prior history, now seems worse with significant decline in his health Not focus of visit See A&P insomnia, will treat with xanax PRN per request since this has worked in past, will adjust med in future may try clonazepam vs ativan Recommend future SSRI vs Trazodone, he declines new start med today Will return 4 weeks to revew      Relevant Medications   ALPRAZolam (XANAX) 0.5 MG tablet   Pulmonary HTN (HCC) (Chronic)    Concern with moderate pulm HTN on prior ECHO Related to chronic PAD,  HTN, and co morbid with COPD Suspect this is major part of his dyspnea on exertion with COPD Followed by Cardiology for further management      Relevant Medications   aspirin 81 MG tablet   Other Relevant Orders   Ambulatory referral to Pulmonology      Meds ordered this encounter  Medications  . ALPRAZolam (XANAX) 0.5 MG tablet    Sig: Take 1 tablet (0.5 mg total) by mouth 2 (two) times daily as needed for anxiety or sleep.    Dispense:  60 tablet    Refill:  0   Orders Placed This Encounter  Procedures  . Ambulatory referral to Pulmonology    Referral Priority:   Routine    Referral Type:   Consultation    Referral Reason:   Specialty Services Required    Requested Specialty:   Pulmonary Disease    Number of Visits Requested:   1     Follow up plan: Return in about 4 weeks (around 03/21/2018) for Insomnia, PHQ/GAD med adjust, f/u pulm.  A total of >50  minutes was spent face-to-face with this patient. Greater than 50% of this time was spent in counseling on multiple chronic medical conditions particularly COPD, pulmonary HTN, and his chronic respiratory symptoms, reviewed diagnostic testing, treatment options, and future complications.  Nobie Putnam, [facility] 02/22/2018, 2:07 AM

## 2018-02-21 NOTE — Progress Notes (Signed)
Subjective:   Michael Delacruz is a 77 y.o. male who presents for an Initial Medicare Annual Wellness Visit.  Review of Systems   Cardiac Risk Factors include: male gender;hypertension;advanced age (>53men, >8 women);dyslipidemia;smoking/ tobacco exposure    Objective:    Today's Vitals   02/21/18 1601  BP: 122/86  Pulse: (!) 117  Resp: 17  Temp: 97.8 F (36.6 C)  TempSrc: Oral  Weight: 201 lb (91.2 kg)  Height: 5\' 10"  (1.778 m)   Body mass index is 28.84 kg/m.  Advanced Directives 02/21/2018 08/31/2017 01/01/2016 12/06/2015 11/05/2015 10/23/2015 10/20/2015  Does Patient Have a Medical Advance Directive? No No No No No No No  Would patient like information on creating a medical advance directive? No - Patient declined - - No - patient declined information No - patient declined information No - patient declined information No - patient declined information    Current Medications (verified) Outpatient Encounter Medications as of 02/21/2018  Medication Sig  . ALPRAZolam (XANAX) 0.5 MG tablet Take 1 tablet (0.5 mg total) by mouth 2 (two) times daily as needed for anxiety or sleep.  [name] aspirin 81 MG tablet Take by mouth.  . Multiple Vitamin (MULTI-VITAMINS) TABS Take by mouth.  [name] albuterol (PROVENTIL HFA;VENTOLIN HFA) 108 (90 Base) MCG/ACT inhaler Inhale 2 puffs into the lungs every 6 (six) hours as needed for wheezing or shortness of breath. (Patient not taking: Reported on 02/21/2018)  . furosemide (LASIX) 40 MG tablet Take 1 tablet (40 mg total) by mouth 2 (two) times daily as needed. (Patient not taking: Reported on 02/21/2018)  . [DISCONTINUED] azithromycin (ZITHROMAX) 250 MG tablet Two the first day then one a day (Patient not taking: Reported on 02/21/2018)  . [DISCONTINUED] docusate sodium (COLACE) 100 MG capsule Take 1 capsule (100 mg total) by mouth daily as needed. (Patient not taking: Reported on 02/21/2018)  . [DISCONTINUED] levofloxacin (LEVAQUIN) 500 MG tablet Take 1 tablet (500  mg) by mouth once daily x 14 days (Patient not taking: Reported on 02/21/2018)  . [DISCONTINUED] polyethylene glycol (MIRALAX) packet Take 17 g by mouth daily. (Patient not taking: Reported on 02/21/2018)  . [DISCONTINUED] predniSONE (DELTASONE) 20 MG tablet Take 1 tablet (20 mg total) by mouth daily with breakfast. (Patient not taking: Reported on 02/21/2018)   No facility-administered encounter medications on file as of 02/21/2018.     Allergies (verified) Codeine; Digoxin and related; Macrodantin [nitrofurantoin macrocrystal]; and Morphine and related   History: Past Medical History:  Diagnosis Date  . Arthritis   . Asthma   . Bradycardia   . CAD in native artery    a. LHC 09/2015: 40% pCx, 35% mRCA.  [name] Chronic diastolic congestive heart failure ([place])   . COPD (chronic obstructive pulmonary disease) ([place])   . Depression   . Dilation of intestine 01/2015  . Fibromyalgia   . Frequent headaches   . GERD (gastroesophageal reflux disease)   . History of blood clots    eye   . History of hiatal hernia   . History of rheumatic fever   . Hypertension   . Kidney stone   . PAF (paroxysmal atrial fibrillation) ([place])    a. s/p TEE/DCCV 07/2015. b. H/o bleeding on Coumadin when INR >5, changed to Eliquis.  . S/P Minimally invasive maze operation for atrial fibrillation 10/22/2015   Complete bilateral atrial lesion set using cryothermy and bipolar radiofrequency ablation with clipping of LA appendage via right mini thoracotomy approach  . S/P minimally invasive mitral  valve repair 10/22/2015   Complex valvuloplasty including triangular resection of posterior leaflet, artificial Gore-tex neochord placement x6 and 38 mm Sorin Memo 3D Rechord ring annuloplasty via right minithoracotomy approach  . Severe mitral regurgitation    a. s/p MV repair 10/2015.  [name] Sleep apnea   . Stroke ([place])   . Thyroid disorder   . TIA (transient ischemic attack)   . Tobacco abuse    Past Surgical History:  Procedure  Laterality Date  . ANKLE SURGERY    . CARDIAC CATHETERIZATION N/A 10/03/2015   Procedure: Right and Left Heart Cath and Coronary Angiography;  Surgeon: [name], MD;  Location: [facility];  Service: Cardiovascular;  Laterality: N/A;  . COLONOSCOPY    . ELECTROPHYSIOLOGIC STUDY N/A 08/18/2015   Procedure: CARDIOVERSION;  Surgeon: Wellington Hampshire, MD;  Location: [facility];  Service: Cardiovascular;  Laterality: N/A;  . MINIMALLY INVASIVE MAZE PROCEDURE N/A 10/22/2015   Procedure: MINIMALLY INVASIVE MAZE PROCEDURE;  Surgeon: Rexene Alberts, MD;  Location: [facility];  Service: Open Heart Surgery;  Laterality: N/A;  . MITRAL VALVE REPAIR Right 10/22/2015   Procedure: MINIMALLY INVASIVE MITRAL VALVE REPAIR (MVR);  Surgeon: Rexene Alberts, MD;  Location: [facility];  Service: Open Heart Surgery;  Laterality: Right;  . SINUS EXPLORATION    . TEE WITHOUT CARDIOVERSION N/A 08/18/2015   Procedure: TRANSESOPHAGEAL ECHOCARDIOGRAM (TEE);  Surgeon: Wellington Hampshire, MD;  Location: [facility];  Service: Cardiovascular;  Laterality: N/A;  . TEE WITHOUT CARDIOVERSION N/A 10/22/2015   Procedure: TRANSESOPHAGEAL ECHOCARDIOGRAM (TEE);  Surgeon: Rexene Alberts, MD;  Location: [facility];  Service: Open Heart Surgery;  Laterality: N/A;   Family History  Problem Relation Age of Onset  . Stroke Mother   . Irregular heart beat Mother   . Heart murmur Brother   . Pulmonary embolism Brother   . Hypertension Other    Social History   Socioeconomic History  . Marital status: Married    Spouse name: None  . Number of children: None  . Years of education: None  . Highest education level: None  Social Needs  . Financial resource strain: Not hard at all  . Food insecurity - worry: Never true  . Food insecurity - inability: Never true  . Transportation needs - medical: No  . Transportation needs - non-medical: No  Occupational History  . None  Tobacco Use  . Smoking status: Former Smoker    Packs/day: 0.50      Years: 52.00    Pack years: 26.00    Types: Cigarettes    Last attempt to quit: 02/13/2018    Years since quitting: 0.0  . Smokeless tobacco: Never Used  . Tobacco comment: Former smoker, recent quit 02/13/18 cold [place]  Substance and Sexual Activity  . Alcohol use: No    Alcohol/week: 0.5 oz    Types: 1 Standard drinks or equivalent per week    Comment: rare  . Drug use: No  . Sexual activity: None  Other Topics Concern  . None  Social History Narrative  . None   Tobacco Counseling Counseling given: Not Answered Comment: Former smoker, recent quit 02/13/18 cold [place]   Clinical Intake:  Pre-visit preparation completed: Yes  Pain : No/denies pain     Nutritional Status: BMI 25 -29 Overweight Nutritional Risks: None Diabetes: No  How often do you need to have someone help you when you read instructions, pamphlets, or other written materials from your doctor or pharmacy?:  1 - Never What is the last grade level you completed in school?: 12th grade, GED  Interpreter Needed?: No  Information entered by :: Shritha Bresee,LPN   Activities of Daily Living In your present state of health, do you have any difficulty performing the following activities: 02/21/2018  Hearing? N  Vision? Y  Comment difficulty with right eye- ost about 90% of vision in right eye   Difficulty concentrating or making decisions? Y  Walking or climbing stairs? Y  Comment SOB   Dressing or bathing? Y  Comment dizziness while showering   Doing errands, shopping? N  Preparing Food and eating ? N  Using the Toilet? N  In the past six months, have you accidently leaked urine? N  Do you have problems with loss of bowel control? N  Managing your Medications? N  Managing your Finances? N  Housekeeping or managing your Housekeeping? N  Some recent data might be hidden     Immunizations and Health Maintenance There is no immunization history for the selected administration types on file for this  patient. Health Maintenance Due  Topic Date Due  . TETANUS/TDAP  05/15/1960    Patient Care Team: [name], DO as PCP - General (Family Medicine) [name], MD as Consulting Physician (Cardiology)  Indicate any recent Medical Services you may have received from other than [facility] providers in the past year (date may be approximate).    Assessment:   This is a routine wellness examination for Michael Delacruz.  Hearing/Vision screen Vision Screening Comments: No eye doctor currently.   Dietary issues and exercise activities discussed: Current Exercise Habits: The patient does not participate in regular exercise at present, Exercise limited by: cardiac condition(s);respiratory conditions(s)  Goals    . DIET - INCREASE WATER INTAKE     recommend drinking at least 6-9 glasses of water a day       Depression Screen PHQ 2/9 Scores 02/21/2018  PHQ - 2 Score 4  PHQ- 9 Score 11    Fall Risk Fall Risk  02/21/2018  Falls in the past year? No    Is the patient's home free of loose throw rugs in walkways, pet beds, electrical cords, etc?   yes      Grab bars in the bathroom? yes      Handrails on the stairs?   no, no stairs in home        Adequate lighting?   yes  Timed Get Up and Go performed: Completed in 8 seconds with no use of assistive devices, steady gait. No intervention needed at this time.   Cognitive Function:     6CIT Screen 02/21/2018  What Year? 0 points  What month? 0 points  What time? 0 points  Count back from 20 0 points  Months in reverse 0 points  Repeat phrase 0 points  Total Score 0    Screening Tests Health Maintenance  Topic Date Due  . TETANUS/TDAP  05/15/1960  . INFLUENZA VACCINE  10/24/2018 (Originally 07/20/2017)  . COLONOSCOPY  02/22/2019 (Originally 05/15/1993)  . PNA vac Low Risk Adult (1 of 2 - PCV13) 02/22/2019 (Originally 05/15/2008)    Qualifies for Shingles Vaccine? Yes, discussed shingrix vaccine   Cancer Screenings: Lung:  Low Dose CT Chest recommended if Age 92-80 years, 30 pack-year currently smoking OR have quit w/in 15years. Patient does qualify. In-basket message sent to [facility].  Colorectal: declined   Additional Screenings: Hepatitis B/HIV/Syphillis: not indicated Hepatitis C Screening:  not indicated      Plan:    I have personally reviewed and addressed the Medicare Annual Wellness questionnaire and have noted the following in the patient's chart:  A. Medical and social history B. Use of alcohol, tobacco or illicit drugs  C. Current medications and supplements D. Functional ability and status E.  Nutritional status F.  Physical activity G. Advance directives H. List of other physicians I.  Hospitalizations, surgeries, and ER visits in previous 12 months J.  [facility] such as hearing and vision if needed, cognitive and depression L. Referrals and appointments   In addition, I have reviewed and discussed with patient certain preventive protocols, quality metrics, and best practice recommendations. A written personalized care plan for preventive services as well as general preventive health recommendations were provided to patient.   Signed,  Tyler Aas, LPN Nurse Health Advisor   Nurse Notes: declined all vaccinations and colon cancer screening.

## 2018-02-21 NOTE — Patient Instructions (Addendum)
Thank you for coming to the office today.  1.  Referral to [facility] for further evaluation, will need breathing testing, oxygen testing, and likely need an inhaler treatment in future  PULMONOLOGY   [facility] [address], [place], [place] Phone: [phone]  Congratulations on quitting smoking  Try to use Albuterol inhaler as needed - 1-2 puffs about 1-2 times a day as needed for shortness of breath to see if improvement, try not to overuse it but you may have some side effects rapid heart beat or jittery sensation  Try Xanax 0.5mg  up to 2 times daily for now and evening dose to help sleep, if you need to use it 3 times daily in future then we may need to adjust rx   It was sent to pharmacy  Please schedule a Follow-up Appointment to: Return in about 4 weeks (around 03/21/2018) for Insomnia, PHQ/GAD med adjust, f/u pulm.    If you have any other questions or concerns, please feel free to call the office or send a message through [facility]. You may also schedule an earlier appointment if necessary.  Additionally, you may be receiving a survey about your experience at our office within a few days to 1 week by e-mail or mail. We value your feedback.  Nobie Putnam, DO [place]

## 2018-02-21 NOTE — Patient Instructions (Signed)
Michael Delacruz , Thank you for taking time to come for your Medicare Wellness Visit. I appreciate your ongoing commitment to your health goals. Please review the following plan we discussed and let me know if I can assist you in the future.   Screening recommendations/referrals: Colonoscopy: declined Recommended yearly ophthalmology/optometry visit for glaucoma screening and checkup Recommended yearly dental visit for hygiene and checkup  Vaccinations: Influenza vaccine: declined Pneumococcal vaccine:declined  Tdap vaccine: declined Shingles vaccine: declined  Advanced directives:Advance directive discussed with you today. Even though you declined this today please call our office should you change your mind and we can give you the proper paperwork for you to fill out.  Conditions/risks identified: Recommend drinking at least 6-8 glasses of water a day   Next appointment: Follow up in one year for your annual wellness exam   Preventive Care 65 Years and Older, Male Preventive care refers to lifestyle choices and visits with your health care provider that can promote health and wellness. What does preventive care include?  A yearly physical exam. This is also called an annual well check.  Dental exams once or twice a year.  Routine eye exams. Ask your health care provider how often you should have your eyes checked.  Personal lifestyle choices, including:  Daily care of your teeth and gums.  Regular physical activity.  Eating a healthy diet.  Avoiding tobacco and drug use.  Limiting alcohol use.  Practicing safe sex.  Taking low doses of aspirin every day.  Taking vitamin and mineral supplements as recommended by your health care provider. What happens during an annual well check? The services and screenings done by your health care provider during your annual well check will depend on your age, overall health, lifestyle risk factors, and family history of  disease. Counseling  Your health care provider may ask you questions about your:  Alcohol use.  Tobacco use.  Drug use.  Emotional well-being.  Home and relationship well-being.  Sexual activity.  Eating habits.  History of falls.  Memory and ability to understand (cognition).  Work and work Statistician. Screening  You may have the following tests or measurements:  Height, weight, and BMI.  Blood pressure.  Lipid and cholesterol levels. These may be checked every 5 years, or more frequently if you are over 47 years old.  Skin check.  Lung cancer screening. You may have this screening every year starting at age 64 if you have a 30-pack-year history of smoking and currently smoke or have quit within the past 15 years.  Fecal occult blood test (FOBT) of the stool. You may have this test every year starting at age 40.  Flexible sigmoidoscopy or colonoscopy. You may have a sigmoidoscopy every 5 years or a colonoscopy every 10 years starting at age 73.  Prostate cancer screening. Recommendations will vary depending on your family history and other risks.  Hepatitis C blood test.  Hepatitis B blood test.  Sexually transmitted disease (STD) testing.  Diabetes screening. This is done by checking your blood sugar (glucose) after you have not eaten for a while (fasting). You may have this done every 1-3 years.  Abdominal aortic aneurysm (AAA) screening. You may need this if you are a current or former smoker.  Osteoporosis. You may be screened starting at age 41 if you are at high risk. Talk with your health care provider about your test results, treatment options, and if necessary, the need for more tests. Vaccines  Your health care provider  may recommend certain vaccines, such as:  Influenza vaccine. This is recommended every year.  Tetanus, diphtheria, and acellular pertussis (Tdap, Td) vaccine. You may need a Td booster every 10 years.  Zoster vaccine. You may  need this after age 39.  Pneumococcal 13-valent conjugate (PCV13) vaccine. One dose is recommended after age 56.  Pneumococcal polysaccharide (PPSV23) vaccine. One dose is recommended after age 45. Talk to your health care provider about which screenings and vaccines you need and how often you need them. This information is not intended to replace advice given to you by your health care provider. Make sure you discuss any questions you have with your health care provider. Document Released: 01/02/2016 Document Revised: 08/25/2016 Document Reviewed: 10/07/2015 Elsevier Interactive Patient Education  2017 [place] Prevention in the Home Falls can cause injuries. They can happen to people of all ages. There are many things you can do to make your home safe and to help prevent falls. What can I do on the outside of my home?  Regularly fix the edges of walkways and driveways and fix any cracks.  Remove anything that might make you trip as you walk through a door, such as a raised step or threshold.  Trim any bushes or trees on the path to your home.  Use bright outdoor lighting.  Clear any walking paths of anything that might make someone trip, such as rocks or tools.  Regularly check to see if handrails are loose or broken. Make sure that both sides of any steps have handrails.  Any raised decks and porches should have guardrails on the edges.  Have any leaves, snow, or ice cleared regularly.  Use sand or salt on walking paths during winter.  Clean up any spills in your garage right away. This includes oil or grease spills. What can I do in the bathroom?  Use night lights.  Install grab bars by the toilet and in the tub and shower. Do not use towel bars as grab bars.  Use non-skid mats or decals in the tub or shower.  If you need to sit down in the shower, use a plastic, non-slip stool.  Keep the floor dry. Clean up any water that spills on the floor as soon as it  happens.  Remove soap buildup in the tub or shower regularly.  Attach bath mats securely with double-sided non-slip rug tape.  Do not have throw rugs and other things on the floor that can make you trip. What can I do in the bedroom?  Use night lights.  Make sure that you have a light by your bed that is easy to reach.  Do not use any sheets or blankets that are too big for your bed. They should not hang down onto the floor.  Have a firm chair that has side arms. You can use this for support while you get dressed.  Do not have throw rugs and other things on the floor that can make you trip. What can I do in the kitchen?  Clean up any spills right away.  Avoid walking on wet floors.  Keep items that you use a lot in easy-to-reach places.  If you need to reach something above you, use a strong step stool that has a grab bar.  Keep electrical cords out of the way.  Do not use floor polish or wax that makes floors slippery. If you must use wax, use non-skid floor wax.  Do not have throw rugs  and other things on the floor that can make you trip. What can I do with my stairs?  Do not leave any items on the stairs.  Make sure that there are handrails on both sides of the stairs and use them. Fix handrails that are broken or loose. Make sure that handrails are as long as the stairways.  Check any carpeting to make sure that it is firmly attached to the stairs. Fix any carpet that is loose or worn.  Avoid having throw rugs at the top or bottom of the stairs. If you do have throw rugs, attach them to the floor with carpet tape.  Make sure that you have a light switch at the top of the stairs and the bottom of the stairs. If you do not have them, ask someone to add them for you. What else can I do to help prevent falls?  Wear shoes that:  Do not have high heels.  Have rubber bottoms.  Are comfortable and fit you well.  Are closed at the toe. Do not wear sandals.  If you  use a stepladder:  Make sure that it is fully opened. Do not climb a closed stepladder.  Make sure that both sides of the stepladder are locked into place.  Ask someone to hold it for you, if possible.  Clearly mark and make sure that you can see:  Any grab bars or handrails.  First and last steps.  Where the edge of each step is.  Use tools that help you move around (mobility aids) if they are needed. These include:  Canes.  Walkers.  Scooters.  Crutches.  Turn on the lights when you go into a dark area. Replace any light bulbs as soon as they burn out.  Set up your furniture so you have a clear path. Avoid moving your furniture around.  If any of your floors are uneven, fix them.  If there are any pets around you, be aware of where they are.  Review your medicines with your doctor. Some medicines can make you feel dizzy. This can increase your chance of falling. Ask your doctor what other things that you can do to help prevent falls. This information is not intended to replace advice given to you by your health care provider. Make sure you discuss any questions you have with your health care provider. Document Released: 10/02/2009 Document Revised: 05/13/2016 Document Reviewed: 01/10/2015 Elsevier Interactive Patient Education  2017 [employer].

## 2018-02-22 ENCOUNTER — Telehealth: Payer: Self-pay | Admitting: *Deleted

## 2018-02-22 ENCOUNTER — Encounter: Payer: Self-pay | Admitting: Family Medicine

## 2018-02-22 DIAGNOSIS — G4701 Insomnia due to medical condition: Secondary | ICD-10-CM | POA: Insufficient documentation

## 2018-02-22 NOTE — Assessment & Plan Note (Signed)
Concern with chronic diastolic CHF also as co morbid condition for his respiratory symptoms History of PAD HTN CAD Currently euvolemic, wt stable Has lasix PRN Followed by Cardiology

## 2018-02-22 NOTE — Assessment & Plan Note (Signed)
Concern with primary etiology for dyspnea given history of symptoms with long course of smoking, recently quit x 1 week Other comorbidities affecting his breathing including dCHF, Pulm HTN, AFib among other cardiovascular concerns HIgh risk patient No recent PFTs, previously done in 2015 or later for cardiothoracic surgical eval Not followed by Pulm Not on maintenance or rescue inhaler, has declined to use albuterol  Plan Long discussion on his progressive dyspnea and coughing, concern with limited improvement on course of antibiotics/prednisone, and his non adherence to albuterol Congratulated quitting smoking Emphasized importance of further diagnostic work-up to determine lung function, and optimal treatment, as requested per Cardiology who is trying to work on controlling his cardiac function with AFib/AFlutter and may need further surgery/procedure  Referral to [facility] for further work-up and management of moderate to severe COPD patient, he will likely need formal PFTs and oxygen desat testing ambulation / overnight, and possibly repeat lung imaging, possibly advanced image w/ CT - I advised him to start albuterol PRN that he currently has, and future will probably need maintenance inhaler

## 2018-02-22 NOTE — Assessment & Plan Note (Signed)
Concern with moderate pulm HTN on prior ECHO Related to chronic PAD, HTN, and co morbid with COPD Suspect this is major part of his dyspnea on exertion with COPD Followed by Cardiology for further management

## 2018-02-22 NOTE — Assessment & Plan Note (Signed)
Chronic AFib with tachcyardia AFlutter followed by Cardiology/EP Recent discussion on pacemaker Not on anticoagulation by preference Continue on current meds

## 2018-02-22 NOTE — Telephone Encounter (Signed)
Received referral for low dose lung cancer screening CT scan. Message left at phone number listed in EMR for patient to call me back to facilitate scheduling scan.  

## 2018-02-22 NOTE — Assessment & Plan Note (Signed)
Concern with prior history, now seems worse with significant decline in his health Not focus of visit See A&P insomnia, will treat with xanax PRN per request since this has worked in past, will adjust med in future may try clonazepam vs ativan Recommend future SSRI vs Trazodone, he declines new start med today Will return 4 weeks to revew

## 2018-02-22 NOTE — Assessment & Plan Note (Signed)
Concern with worsening insomnia recently with chronic co morbidities cardio/pulm, affecting his function and sleep In past only med helped his sleep / anxiety has been xanax, unsure which other have tried Given extensive history review today and concern that he has not slept well in 36 hours, agree for 1 month supply xanax 0.5 BID - consider dose adjust vs switch BDZ or alternative agent in future Follow-up 4 weeks

## 2018-02-23 ENCOUNTER — Telehealth: Payer: Self-pay | Admitting: *Deleted

## 2018-02-23 DIAGNOSIS — Z122 Encounter for screening for malignant neoplasm of respiratory organs: Secondary | ICD-10-CM

## 2018-02-23 DIAGNOSIS — Z87891 Personal history of nicotine dependence: Secondary | ICD-10-CM

## 2018-02-23 NOTE — Telephone Encounter (Signed)
Received referral for initial lung cancer screening scan. Contacted patient and obtained smoking history,(former, quit <1 year ago, 32 pack year) as well as answering questions related to screening process. Patient denies signs of lung cancer such as weight loss or hemoptysis. Patient denies comorbidity that would prevent curative treatment if lung cancer were found. Patient is scheduled for shared decision making visit and CT scan on 03/07/18.

## 2018-02-24 ENCOUNTER — Encounter: Payer: Self-pay | Admitting: Internal Medicine

## 2018-02-24 ENCOUNTER — Ambulatory Visit (INDEPENDENT_AMBULATORY_CARE_PROVIDER_SITE_OTHER): Payer: Medicare HMO | Admitting: Internal Medicine

## 2018-02-24 VITALS — BP 116/72 | HR 120 | Resp 16 | Ht 70.0 in | Wt 199.0 lb

## 2018-02-24 DIAGNOSIS — R0609 Other forms of dyspnea: Secondary | ICD-10-CM

## 2018-02-24 DIAGNOSIS — F17210 Nicotine dependence, cigarettes, uncomplicated: Secondary | ICD-10-CM

## 2018-02-24 DIAGNOSIS — J439 Emphysema, unspecified: Secondary | ICD-10-CM

## 2018-02-24 DIAGNOSIS — Z720 Tobacco use: Secondary | ICD-10-CM

## 2018-02-24 DIAGNOSIS — R0600 Dyspnea, unspecified: Secondary | ICD-10-CM

## 2018-02-24 MED ORDER — FLUTICASONE FUROATE-VILANTEROL 200-25 MCG/INH IN AEPB: 1.0000 | INHALATION_SPRAY | Freq: Every day | RESPIRATORY_TRACT | 5 refills | Status: DC

## 2018-02-24 NOTE — Patient Instructions (Signed)
  Your lung condition is COPD/emphysema.  --Quitting smoking is the most important thing that you can do for your health.  --Quitting smoking will have greater affect on your health than any medicine that we can give you.   Will start Breo Inhaler once daily. Rinse mouth after use.
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
## 2018-03-07 ENCOUNTER — Ambulatory Visit
Admission: RE | Admit: 2018-03-07 | Discharge: 2018-03-07 | Disposition: A | Discharge status: Home / Self Care | Payer: Medicare HMO | Source: Ambulatory Visit | Attending: Oncology | Admitting: Oncology

## 2018-03-07 ENCOUNTER — Inpatient Hospital Stay: Payer: Medicare HMO | Attending: Oncology | Admitting: Oncology

## 2018-03-07 DIAGNOSIS — Z122 Encounter for screening for malignant neoplasm of respiratory organs: Secondary | ICD-10-CM

## 2018-03-07 DIAGNOSIS — J439 Emphysema, unspecified: Secondary | ICD-10-CM | POA: Insufficient documentation

## 2018-03-07 DIAGNOSIS — I700 Atherosclerosis of aorta: Secondary | ICD-10-CM | POA: Diagnosis not present

## 2018-03-07 DIAGNOSIS — R911 Solitary pulmonary nodule: Secondary | ICD-10-CM | POA: Insufficient documentation

## 2018-03-07 DIAGNOSIS — Z87891 Personal history of nicotine dependence: Secondary | ICD-10-CM

## 2018-03-07 NOTE — Progress Notes (Signed)
In accordance with CMS guidelines, patient has met eligibility criteria including age, absence of signs or symptoms of lung cancer.  Social History   Tobacco Use  . Smoking status: Former Smoker    Packs/day: 0.50    Years: 52.00    Pack years: 26.00    Types: Cigarettes    Last attempt to quit: 02/13/2018    Years since quitting: 0.0  . Smokeless tobacco: Never Used  . Tobacco comment: Former smoker, recent quit 02/13/18 cold turkey  Substance Use Topics  . Alcohol use: No    Alcohol/week: 0.5 oz    Types: 1 Standard drinks or equivalent per week    Comment: rare  . Drug use: No     A shared decision-making session was conducted prior to the performance of CT scan. This includes one or more decision aids, includes benefits and harms of screening, follow-up diagnostic testing, over-diagnosis, false positive rate, and total radiation exposure.  Counseling on the importance of adherence to annual lung cancer LDCT screening, impact of co-morbidities, and ability or willingness to undergo diagnosis and treatment is imperative for compliance of the program.  Counseling on the importance of continued smoking cessation for former smokers; the importance of smoking cessation for current smokers, and information about tobacco cessation interventions have been given to patient including [place] [facility]uit [facility] and [facility] quit [place] programs.  Written order for lung cancer screening with LDCT has been given to the patient and any and all questions have been answered to the best of my abilities.   Yearly follow up will be coordinated by Shawn Perkins, Thoracic Navigator.  Jennifer Burns, NP 03/07/2018 9:46 AM  

## 2018-03-09 ENCOUNTER — Ambulatory Visit: Payer: Medicare HMO | Admitting: Internal Medicine

## 2018-03-13 ENCOUNTER — Telehealth: Payer: Self-pay | Admitting: *Deleted

## 2018-03-13 NOTE — Telephone Encounter (Signed)
Noted. Will follow the results as they are availabe  Shawn, thank you for following up on this.  Nobie Putnam, [facility] 03/13/2018, 4:13 PM

## 2018-03-13 NOTE — Telephone Encounter (Signed)
Notified patient of LDCT lung cancer screening program results with recommendation for 6 month follow up imaging. Also notified of incidental findings noted below and is encouraged to discuss further with PCP who will receive a copy of this note and/or the CT report. Patient verbalizes understanding.   IMPRESSION: 1. Lung-RADS 3, probably benign findings. Short-term follow-up in 6 months is recommended with repeat low-dose chest CT without contrast (please use the following order, "CT CHEST LCS NODULE FOLLOW-UP W/O CM"). Somewhat elongated 7.3 mm nodule medial right middle lobe, likely scarring, but 6 month follow-up recommended to confirm. 2. Emphysema with underlying changes of smoking related lung disease. 3.  Aortic Atherosclerois (ICD10-170.0)

## 2018-03-27 ENCOUNTER — Encounter: Payer: Self-pay | Admitting: Family Medicine

## 2018-03-27 ENCOUNTER — Ambulatory Visit (INDEPENDENT_AMBULATORY_CARE_PROVIDER_SITE_OTHER): Payer: Self-pay | Admitting: Family Medicine

## 2018-03-27 VITALS — BP 104/80 | HR 119 | Temp 98.0°F | Resp 16 | Ht 70.0 in | Wt 201.2 lb

## 2018-03-27 DIAGNOSIS — G4701 Insomnia due to medical condition: Secondary | ICD-10-CM

## 2018-03-27 DIAGNOSIS — J432 Centrilobular emphysema: Secondary | ICD-10-CM

## 2018-03-27 MED ORDER — ALPRAZOLAM 0.5 MG PO TABS: 0.5000 mg | ORAL_TABLET | Freq: Two times a day (BID) | ORAL | 0 refills | Status: DC | PRN

## 2018-03-27 NOTE — Assessment & Plan Note (Signed)
Limited improve so far on Breo maintenance, and albuterol PRN Still has chronic problem primary issue with dyspnea on exertion Established with [facility] Pulmonology Dr Juanell Fairly, however no further PFTs or diagnostics done, and patient does not intend to follow back up with them based on his preference. - Resumed smoking again - Other comorbidities affecting his breathing including dCHF, Pulm HTN, AFib among other cardiovascular concerns  Plan Again, long discussion on his progressive dyspnea - explained that PFTs only assist with determining his current function, however we know the diagnosis based on history and imaging CT scan, consistent with emphysema as per Pulm. He is requesting to follow me as PCP for COPD instead. - Recommend to continue Breo trial give it longer to see if helping - Use Albuterol PRN - Recommend quit smoking  For follow-up we can see him again in 4 weeks to see progress on Breo, alternatively may need to change maintenance inhaler until determine if can improve control. If still no success, he should return to [facility] for re-evaluation and continued management. Asked him to request different provider if this is his decision, rather than never returning to [facility].

## 2018-03-27 NOTE — Progress Notes (Signed)
Subjective:    Patient ID: Michael Delacruz, male    DOB: [birth year], 77 y.o.   MRN: [id number]  Michael Delacruz is a 77 y.o. male presenting on 03/27/2018 for Depression and COPD  Patient is accompanied by his wife, [name], who provides additional history.   HPI    FOLLOW-UP Depression / Insomnia - Last visit with me 02/21/18, for initial visit for new patient for same problems, treated with continued prior Xanax 0.5mg  PRN to assist with sleep, as he had been on chronic BDZ before, see prior notes for background information. - Interval update with persistent problem with insomnia, when does not take Xanax - Today patient reports still has problem with mood, describes that his depressed mood is related to his chronic illnesses, mainly breathing, he feels like he is less able to do activities and things he used to do, and now this brings mood down - Additionally he has difficulty sleeping mostly due to breathing among other concerns, he is less convinced depression is related to his sleeping - He is taking Xanax 0.5mg  tab nightly for sleep, and rarely taking additional tab 0.5mg  during day PRN anxiety, he has about 18-20 tabs left over from last rx - Also in past he has taken OTC melatonin up to 40mg  without relief by report - He is unsure exactly if has been on SSRI - He is not interested in seeing Psychiatrist or Psychologist, he has seen therapist many years ago, and not interested in returning at this time.  FOLLOW-UP COPD (Moderate to Severe, Emphysema) - Last visit with me 02/21/18, for initial visit for same problem, treated with referral to Pulmonology for further diagnostic and eval of breathing and further management, I gave him rx albuterol PRN, see prior notes for background information. - Interval update with saw Dr Juanell Fairly on 02/24/18 promptly 3 days after I last saw him. As per note, they had long discussion on diagnosis of COPD, he was given clinical diagnosis based on history exam  and CT imaging with moderate to severe COPD, no further PFTs performed or scheduled, he was advised to quit smoking again, as patient had resumed after our last visit, and also given rx Breo inhaler for maintenance. - Today patient reports same problems with breathing and COPD. No new complaints, he has difficulty telling if Breo inhaler is helping his breathing on daily basis yet or not. He is using Albuterol sometimes. Seems adherent to [facility]. - He is trying to increase walking again - Additionally regarding future pulm follow-up he prefers to not see Dr Juanell Fairly again - He had low dose lung CA screen CT, next repeat in 6 months to be scheduled by [facility] - Admits dyspnea - Denies active productive cough, wheezing, headache, chest pain, numbness tingling;   Depression screen [facility] 2/9 03/27/2018 02/21/2018  Decreased Interest 2 2  Down, Depressed, Hopeless 2 2  PHQ - 2 Score 4 4  Altered sleeping 2 3  Tired, decreased energy 2 -  Change in appetite 2 2  Feeling bad or failure about yourself  2 1  Trouble concentrating 2 1  Moving slowly or fidgety/restless 1 0  Suicidal thoughts 0 0  PHQ-9 Score 15 11  Difficult doing work/chores Somewhat difficult Not difficult at all   No flowsheet data found.  Social History   Tobacco Use  . Smoking status: Current Every Day Smoker    Packs/day: 0.50    Years: 52.00    Pack years: 26.00  Types: Cigarettes    Last attempt to quit: 02/13/2018    Years since quitting: 0.1  . Smokeless tobacco: Never Used  . Tobacco comment: Resumed smoking, after quit 01/2018  Substance Use Topics  . Alcohol use: No    Alcohol/week: 0.5 oz    Types: 1 Standard drinks or equivalent per week    Comment: rare  . Drug use: No    Review of Systems Per HPI unless specifically indicated above     Objective:    BP 104/80   Pulse (!) 119   Temp 98 F (36.7 C) (Oral)   Resp 16   Ht 5\' 10"  (1.778 m)   Wt 201 lb 3.2 oz (91.3 kg)   BMI 28.87 kg/m   Wt  Readings from Last 3 Encounters:  03/27/18 201 lb 3.2 oz (91.3 kg)  03/07/18 197 lb (89.4 kg)  02/24/18 199 lb (90.3 kg)    Physical Exam  Constitutional: He is oriented to person, place, and time. He appears well-developed and well-nourished. No distress.  Mostly well appearing comfortable, cooperative  HENT:  Head: Normocephalic and atraumatic.  Mouth/Throat: Oropharynx is clear and moist.  Eyes: Conjunctivae are normal. Right eye exhibits no discharge. Left eye exhibits no discharge.  Neck: Normal range of motion. Neck supple.  Cardiovascular: Regular rhythm, normal heart sounds and intact distal pulses.  No murmur heard. tachycardic  Pulmonary/Chest: Effort normal. No respiratory distress. He has no wheezes. He has no rales.  Similar but slightly improved air movement diffusely. Without focal crackle wheeze or abnormality. Seems to be able to breathe normally during conversation.  Abdominal: Soft. Bowel sounds are normal. He exhibits no distension. There is no tenderness (Non reproducible mid abdominal tenderness on exam).  Musculoskeletal: Normal range of motion. He exhibits no edema.  Lymphadenopathy:    He has no cervical adenopathy.  Neurological: He is alert and oriented to person, place, and time.  Skin: Skin is warm and dry. No rash noted. He is not diaphoretic. No erythema.  Psychiatric: His behavior is normal.  Well groomed, good eye contact, normal speech and thoughts. Mood appears depressed, he is frustrated often with his chronic conditions, has some poor insight into his health and prognosis  Nursing note and vitals reviewed.  Results for orders placed or performed during the hospital encounter of 50/93/26  Basic metabolic panel  Result Value Ref Range   Sodium 138 135 - 145 mmol/L   Potassium 4.2 3.5 - 5.1 mmol/L   Chloride 105 101 - 111 mmol/L   CO2 23 22 - 32 mmol/L   Glucose, Bld 120 (H) 65 - 99 mg/dL   BUN 12 6 - 20 mg/dL   Creatinine, Ser 1.12 0.61 - 1.24  mg/dL   Calcium 9.2 8.9 - 10.3 mg/dL   GFR calc non Af Amer >60 >60 mL/min   GFR calc Af Amer >60 >60 mL/min   Anion gap 10 5 - 15  CBC  Result Value Ref Range   WBC 5.1 3.8 - 10.6 K/uL   RBC 4.79 4.40 - 5.90 MIL/uL   Hemoglobin 14.9 13.0 - 18.0 g/dL   HCT 45.2 40.0 - 52.0 %   MCV 94.5 80.0 - 100.0 fL   MCH 31.2 26.0 - 34.0 pg   MCHC 33.0 32.0 - 36.0 g/dL   RDW 14.3 11.5 - 14.5 %   Platelets 157 150 - 440 K/uL  Troponin I  Result Value Ref Range   Troponin I <0.03 <0.03 ng/mL  TSH  Result Value Ref Range   TSH 9.751 (H) 0.350 - 4.500 uIU/mL  Urinalysis, Complete w Microscopic  Result Value Ref Range   Color, Urine YELLOW (A) YELLOW   APPearance CLEAR (A) CLEAR   Specific Gravity, Urine 1.019 1.005 - 1.030   pH 5.0 5.0 - 8.0   Glucose, UA NEGATIVE NEGATIVE mg/dL   Hgb urine dipstick SMALL (A) NEGATIVE   Bilirubin Urine NEGATIVE NEGATIVE   Ketones, ur NEGATIVE NEGATIVE mg/dL   Protein, ur NEGATIVE NEGATIVE mg/dL   Nitrite NEGATIVE NEGATIVE   Leukocytes, UA NEGATIVE NEGATIVE   RBC / HPF 0-5 0 - 5 RBC/hpf   WBC, UA 0-5 0 - 5 WBC/hpf   Bacteria, UA NONE SEEN NONE SEEN   Squamous Epithelial / LPF NONE SEEN NONE SEEN   Mucus PRESENT   Brain natriuretic peptide  Result Value Ref Range   B Natriuretic Peptide 250.0 (H) 0.0 - 100.0 pg/mL  Hepatic function panel  Result Value Ref Range   Total Protein 7.0 6.5 - 8.1 g/dL   Albumin 4.0 3.5 - 5.0 g/dL   AST 19 15 - 41 U/L   ALT 11 (L) 17 - 63 U/L   Alkaline Phosphatase 67 38 - 126 U/L   Total Bilirubin 0.5 0.3 - 1.2 mg/dL   Bilirubin, Direct 0.1 0.1 - 0.5 mg/dL   Indirect Bilirubin 0.4 0.3 - 0.9 mg/dL  Lipase, blood  Result Value Ref Range   Lipase 35 11 - 51 U/L  Troponin I  Result Value Ref Range   Troponin I <0.03 <0.03 ng/mL      Assessment & Plan:   Problem List Items Addressed This Visit    Centrilobular emphysema (HCC)    Limited improve so far on Breo maintenance, and albuterol PRN Still has chronic problem  primary issue with dyspnea on exertion Established with [facility] Dr Juanell Fairly, however no further PFTs or diagnostics done, and patient does not intend to follow back up with them based on his preference. - Resumed smoking again - Other comorbidities affecting his breathing including dCHF, Pulm HTN, AFib among other cardiovascular concerns  Plan Again, long discussion on his progressive dyspnea - explained that PFTs only assist with determining his current function, however we know the diagnosis based on history and imaging CT scan, consistent with emphysema as per Pulm. He is requesting to follow me as PCP for COPD instead. - Recommend to continue Breo trial give it longer to see if helping - Use Albuterol PRN - Recommend quit smoking  For follow-up we can see him again in 4 weeks to see progress on Breo, alternatively may need to change maintenance inhaler until determine if can improve control. If still no success, he should return to [facility] for re-evaluation and continued management. Asked him to request different provider if this is his decision, rather than never returning to [facility]      Insomnia due to medical condition - Primary    Chronic insomnia, recent worse d/t COPD among other concerns Seems multifactorial etiology, some degree of underlying depression likely affecting his symptoms. On chronic BDZ with Xanax Limited use or not tried SSRI or other anti depressant or more maintenance med  Plan Long discussion on insomnia and treatment options - again he seems very resistant to treatment. Offered him various med options for maintenance to help mood and likely improve insomnia, such as SSRI or SNRI, and possibly other meds such as Trazodone. - he declines for now, prefers  to stick with med that has been successful while we are working on his breathing - I agree to refill Xanax temporarily, in future goal to taper off or taper down on dose, reviewed my concerns about  safety on this med - Follow-up 4 weeks - if still requires Xanax then would consider lower # pill count to better reflect his current dosing if mostly only taking PM for insomnia - recommend next step would be to add more stabilizing med first before taper off Xanax      Relevant Medications   ALPRAZolam (XANAX) 0.5 MG tablet      Meds ordered this encounter  Medications  . ALPRAZolam (XANAX) 0.5 MG tablet    Sig: Take 1 tablet (0.5 mg total) by mouth 2 (two) times daily as needed for anxiety or sleep.    Dispense:  60 tablet    Refill:  0    Follow up plan: Return in about 1 month (around 04/24/2018) for Insomnia, COPD, med adjust.  Nobie Putnam, DO [facility] 03/27/2018, 11:34 PM

## 2018-03-27 NOTE — Patient Instructions (Addendum)
Thank you for coming to the office today.  Continue Breo inhaler  Work on improving the walking as discussed  Refill Xanax for now - we may adjust dose in future   Please consider alternative sleep medicines in future - such as Trazodone, we can consider this in future.  Please schedule a Follow-up Appointment to: Return in about 1 month (around 04/24/2018) for Insomnia, COPD, med adjust.  If you have any other questions or concerns, please feel free to call the office or send a message through [place]. You may also schedule an earlier appointment if necessary.  Additionally, you may be receiving a survey about your experience at our office within a few days to 1 week by e-mail or mail. We value your feedback.  Nobie Putnam, DO [place]

## 2018-03-27 NOTE — Assessment & Plan Note (Signed)
Chronic insomnia, recent worse d/t COPD among other concerns Seems multifactorial etiology, some degree of underlying depression likely affecting his symptoms. On chronic BDZ with Xanax Limited use or not tried SSRI or other anti depressant or more maintenance med  Plan Long discussion on insomnia and treatment options - again he seems very resistant to treatment. Offered him various med options for maintenance to help mood and likely improve insomnia, such as SSRI or SNRI, and possibly other meds such as Trazodone. - he declines for now, prefers to stick with med that has been successful while we are working on his breathing - I agree to refill Xanax temporarily, in future goal to taper off or taper down on dose, reviewed my concerns about safety on this med - Follow-up 4 weeks - if still requires Xanax then would consider lower # pill count to better reflect his current dosing if mostly only taking PM for insomnia - recommend next step would be to add more stabilizing med first before taper off Xanax

## 2018-04-25 ENCOUNTER — Encounter: Payer: Self-pay | Admitting: Family Medicine

## 2018-04-25 ENCOUNTER — Ambulatory Visit (INDEPENDENT_AMBULATORY_CARE_PROVIDER_SITE_OTHER): Payer: Self-pay | Admitting: Family Medicine

## 2018-04-25 VITALS — BP 94/65 | HR 119 | Temp 97.4°F | Resp 16 | Ht 70.0 in | Wt 199.6 lb

## 2018-04-25 DIAGNOSIS — G4701 Insomnia due to medical condition: Secondary | ICD-10-CM

## 2018-04-25 DIAGNOSIS — K5900 Constipation, unspecified: Secondary | ICD-10-CM

## 2018-04-25 DIAGNOSIS — J432 Centrilobular emphysema: Secondary | ICD-10-CM

## 2018-04-25 MED ORDER — POLYETHYLENE GLYCOL 3350 17 GM/SCOOP PO POWD: 17.0000 g | Freq: Every day | ORAL | 1 refills | Status: DC | PRN

## 2018-04-25 MED ORDER — ALPRAZOLAM 0.25 MG PO TABS: 0.2500 mg | ORAL_TABLET | Freq: Two times a day (BID) | ORAL | 0 refills | Status: DC | PRN

## 2018-04-25 NOTE — Progress Notes (Addendum)
Subjective:    Patient ID: Michael Delacruz, male    DOB: 1941/11/29, 77 y.o.   MRN: [id number]  Michael Delacruz is a 76 y.o. male presenting on 04/25/2018 for Insomnia   HPI    FOLLOW-UP Depression / Insomnia Last visit 03/2018, reviewed his chronic insomnia, agreed to refill his previous Xanax 0.5mg  BID and future goal to taper down given multiple risks on this med. - Today states that Xanax is no longer as effective for sleep, he is planning to wean off 0.5 - down to 0.25mg  requested next, he still does not want to consider replacement medicine for this such as SSRI - He is not interested in seeing Psychiatrist or Psychologist, he has seen therapist many years ago, and not interested in returning at this time.  FOLLOW-UP COPD / Chronic Fatigue Last visit he was seen 03/27/18 for this issue, he was advised to start previously rx Breo to help maintenance breathing for COPD, or pursue return to [facility] and PFT testing - Interval update - he has never picked up Breo due to suspected cost, he is unsure what cost is, but states he bought Albuterol at $45 dollars instead, and is frustrated with the cost - He does not want to return to Pulmonology - He has not quit smoking yet, not ready - Still reports similar complaints with feels tired, some days better, others worse, starting to work on improving walking around house, working on improving distance and goal to walk outside [employer] generalized weakness but also in hands and grip - Admits dyspnea with minimal exertion  Abdominal pain epigastric radiating down lower abdomen, seems to be episodic intermittent can last for several days. Last imaging [facility] 12/2017 Admits constipation with intermittent sometimes 3-4 days with hard BM then Tried Amyl Nitrate Not taking any other stool softner at this time Denies fever chills, nausea vomiting   Depression screen [facility] 2/9 03/27/2018 02/21/2018  Decreased Interest 2 2  Down, Depressed, Hopeless 2 2  PHQ - 2  Score 4 4  Altered sleeping 2 3  Tired, decreased energy 2 -  Change in appetite 2 2  Feeling bad or failure about yourself  2 1  Trouble concentrating 2 1  Moving slowly or fidgety/restless 1 0  Suicidal thoughts 0 0  PHQ-9 Score 15 11  Difficult doing work/chores Somewhat difficult Not difficult at all    Social History   Tobacco Use  . Smoking status: Current Every Day Smoker    Packs/day: 0.50    Years: 52.00    Pack years: 26.00    Types: Cigarettes    Last attempt to quit: 02/13/2018    Years since quitting: 0.1  . Smokeless tobacco: Never Used  . Tobacco comment: Resumed smoking, after quit 01/2018  Substance Use Topics  . Alcohol use: No    Alcohol/week: 0.5 oz    Types: 1 Standard drinks or equivalent per week    Comment: rare  . Drug use: No    Review of Systems Per HPI unless specifically indicated above     Objective:    BP 94/65   Pulse (!) 119   Temp (!) 97.4 F (36.3 C) (Oral)   Resp 16   Ht 5\' 10"  (1.778 m)   Wt 199 lb 9.6 oz (90.5 kg)   BMI 28.64 kg/m   Wt Readings from Last 3 Encounters:  04/25/18 199 lb 9.6 oz (90.5 kg)  03/27/18 201 lb 3.2 oz (91.3 kg)  03/07/18  197 lb (89.4 kg)    Physical Exam  Constitutional: He is oriented to person, place, and time. He appears well-developed and well-nourished. No distress.  Mostly well appearing comfortable except slightly dyspneic with extensive talking at times, cooperative  HENT:  Head: Normocephalic and atraumatic.  Mouth/Throat: Oropharynx is clear and moist.  Eyes: Right eye exhibits no discharge. Left eye exhibits no discharge.  Neck: Normal range of motion. Neck supple.  Cardiovascular: Regular rhythm, normal heart sounds and intact distal pulses.  No murmur heard. tachycardic  Pulmonary/Chest: Effort normal. No respiratory distress. He has no wheezes. He has no rales.  Similar to last visit persistent mild reduced air movement diffusely. Without focal crackle wheeze or abnormality.    Abdominal: Soft. He exhibits no distension. There is no tenderness (Non reproducible mid abdominal tenderness on exam).  Bowel sounds reduced, diffusely, mild generalized distention  Musculoskeletal: Normal range of motion. He exhibits no edema.  Lymphadenopathy:    He has no cervical adenopathy.  Neurological: He is alert and oriented to person, place, and time.  Skin: Skin is warm and dry. No rash noted. He is not diaphoretic. No erythema.  Psychiatric: His behavior is normal.  Well groomed, good eye contact, normal speech and thoughts. Mood appears depressed, he is frustrated often with his chronic conditions, has some poor insight into his health and prognosis  Nursing note and vitals reviewed.  Results for orders placed or performed during the hospital encounter of 02/54/27  Basic metabolic panel  Result Value Ref Range   Sodium 138 135 - 145 mmol/L   Potassium 4.2 3.5 - 5.1 mmol/L   Chloride 105 101 - 111 mmol/L   CO2 23 22 - 32 mmol/L   Glucose, Bld 120 (H) 65 - 99 mg/dL   BUN 12 6 - 20 mg/dL   Creatinine, Ser 1.12 0.61 - 1.24 mg/dL   Calcium 9.2 8.9 - 10.3 mg/dL   GFR calc non Af Amer >60 >60 mL/min   GFR calc Af Amer >60 >60 mL/min   Anion gap 10 5 - 15  CBC  Result Value Ref Range   WBC 5.1 3.8 - 10.6 K/uL   RBC 4.79 4.40 - 5.90 MIL/uL   Hemoglobin 14.9 13.0 - 18.0 g/dL   HCT 45.2 40.0 - 52.0 %   MCV 94.5 80.0 - 100.0 fL   MCH 31.2 26.0 - 34.0 pg   MCHC 33.0 32.0 - 36.0 g/dL   RDW 14.3 11.5 - 14.5 %   Platelets 157 150 - 440 K/uL  Troponin I  Result Value Ref Range   Troponin I <0.03 <0.03 ng/mL  TSH  Result Value Ref Range   TSH 9.751 (H) 0.350 - 4.500 uIU/mL  Urinalysis, Complete w Microscopic  Result Value Ref Range   Color, Urine YELLOW (A) YELLOW   APPearance CLEAR (A) CLEAR   Specific Gravity, Urine 1.019 1.005 - 1.030   pH 5.0 5.0 - 8.0   Glucose, UA NEGATIVE NEGATIVE mg/dL   Hgb urine dipstick SMALL (A) NEGATIVE   Bilirubin Urine NEGATIVE NEGATIVE    Ketones, ur NEGATIVE NEGATIVE mg/dL   Protein, ur NEGATIVE NEGATIVE mg/dL   Nitrite NEGATIVE NEGATIVE   Leukocytes, UA NEGATIVE NEGATIVE   RBC / HPF 0-5 0 - 5 RBC/hpf   WBC, UA 0-5 0 - 5 WBC/hpf   Bacteria, UA NONE SEEN NONE SEEN   Squamous Epithelial / LPF NONE SEEN NONE SEEN   Mucus PRESENT   Brain natriuretic peptide  Result  Value Ref Range   B Natriuretic Peptide 250.0 (H) 0.0 - 100.0 pg/mL  Hepatic function panel  Result Value Ref Range   Total Protein 7.0 6.5 - 8.1 g/dL   Albumin 4.0 3.5 - 5.0 g/dL   AST 19 15 - 41 U/L   ALT 11 (L) 17 - 63 U/L   Alkaline Phosphatase 67 38 - 126 U/L   Total Bilirubin 0.5 0.3 - 1.2 mg/dL   Bilirubin, Direct 0.1 0.1 - 0.5 mg/dL   Indirect Bilirubin 0.4 0.3 - 0.9 mg/dL  Lipase, blood  Result Value Ref Range   Lipase 35 11 - 51 U/L  Troponin I  Result Value Ref Range   Troponin I <0.03 <0.03 ng/mL      Assessment & Plan:   Problem List Items Addressed This Visit    Constipation    Suspected etiology for his midline abdominal pain, consistent with last imaging with moderate stool burden, exam today with distention and slow bowel sounds and history of constipation - Trial on miralax, sent rx, dosing given in handout, improve water hydration - Follow-up as planned if not improving, return criteria given      Relevant Medications   polyethylene glycol powder (GLYCOLAX/MIRALAX) powder   COPD (chronic obstructive pulmonary disease) (HCC) - Primary (Chronic)    Still has chronic problem primary issue with dyspnea on exertion, generalized weakness likely related - Never started Breo maintenance, not returning to Pulm or PFTs either by his request, declines to proceed with this treatment - He is limited by cost for Breo - Continues to smoke, not ready to quit - Other comorbidities affecting his breathing including dCHF, Pulm HTN, AFib among other cardiovascular concerns  Plan Again, long discussion on his progressive dyspnea and dx of  COPD - Advised he needs maintenance inhaler - can contact pharmacy and insurance to determine Breo cost or other alternatives and see if he meets financial assistance criteria - will work on this w/ staff and contact him - Use Albuterol PRN, limit regular use - Recommend quit smoking - He did not return to Pulm and see different provider - Concerns with medical non adherence to recommendations - Follow-up as planned 3 months - he will likely benefit from above recommended ICS maintenance, Pulmonology management PFTs, possible future supplemental O2  UPDATE 04/26/18 - next day Our staff checked with pharmacy/insurance and determined Breo cost should be $45 for him also Incruse is $45. He may consider [facility] and also possibly submit financial aid request app to manufacturer. He agrees to pay the $45 to try Breo - I advised him about it being maintenance dose take one daily for at least 1 month to see if it is effective, refills given, goal to reduce his dyspnea and flares, and he will use Albuterol PRN only. Will follow-up if not improving or other issues, he was appreciative of the phone call.      Relevant Medications   fluticasone furoate-vilanterol (BREO ELLIPTA) 100-25 MCG/INH AEPB   Insomnia due to medical condition    Chronic insomnia, recent worse d/t COPD among other concerns Seems multifactorial etiology, some degree of underlying depression likely affecting his symptoms. On chronic BDZ with Xanax Limited use or not tried SSRI or other anti depressant or more maintenance med  Plan Agree reduce dose Xanax from 0.5 to 0.25mg  BID for now then taper down, cautiously avoid withdrawal, he agrees and not helping as much anymore, future may need SSRI or Trazodone  Relevant Medications   ALPRAZolam (XANAX) 0.25 MG tablet      Meds ordered this encounter  Medications  . ALPRAZolam (XANAX) 0.25 MG tablet    Sig: Take 1 tablet (0.25 mg total) by mouth 2 (two) times  daily as needed for anxiety or sleep.    Dispense:  60 tablet    Refill:  0  . polyethylene glycol powder (GLYCOLAX/MIRALAX) powder    Sig: Take 17 g by mouth daily as needed for mild constipation or moderate constipation. Start 1 cap for few days, if need increase to 2 caps    Dispense:  250 g    Refill:  1  . fluticasone furoate-vilanterol (BREO ELLIPTA) 100-25 MCG/INH AEPB    Sig: Inhale 1 puff into the lungs daily.    Dispense:  28 each    Refill:  5    Follow up plan: Return in about 3 months (around 07/26/2018) for COPD, Insomnia.    Nobie Putnam, [facility] 04/26/2018, 11:44 AM

## 2018-04-25 NOTE — Patient Instructions (Addendum)
Thank you for coming to the office today.  I think your abdominal pain is from constipation (less frequent bowel movement that can be hard dry or involve straining).  Recommend trying OTC Miralax 17g = 1 capful in large glass water once daily for now, try several days to see if working, goal is soft stool or BM 1-2 times daily, if too loose then reduce dose or try every other day. If not effective may need to increase it to 2 doses at once in AM or may do 1 in morning and 1 in afternoon/evening  - This medicine is very safe and can be used often without any problem and will not make you dehydrated. It is good for use on AS NEEDED BASIS or even MAINTENANCE therapy for longer term for several days to weeks at a time to help regulate bowel movements  Other more natural remedies or preventative treatment: - Increase hydration with water - Increase fiber in diet (high fiber foods = vegetables, leafy greens, oats/grains) - May take OTC Fiber supplement (metamucil powder or pill/gummy) - May try OTC Probiotic  For Xanax - new rx sent for lower dose 0.25mg  twice daily, continue to taper off - as discussed I can offer alternative medications for sleep if you are interested  For COPD and lungs - I would consider a DAILY inhaler like the one the lung doctors have already prescribed for you, since the cost was too high we will need to try to find a better alternative, we can call you with this information  Follow-up with Cardiologist as planned  Please schedule a Follow-up Appointment to: Return in about 3 months (around 07/26/2018) for COPD, Insomnia.  If you have any other questions or concerns, please feel free to call the office or send a message through [place]. You may also schedule an earlier appointment if necessary.  Additionally, you may be receiving a survey about your experience at our office within a few days to 1 week by e-mail or mail. We value your feedback.  Nobie Putnam,  DO [place]

## 2018-04-26 ENCOUNTER — Encounter: Payer: Self-pay | Admitting: Family Medicine

## 2018-04-26 MED ORDER — FLUTICASONE FUROATE-VILANTEROL 100-25 MCG/INH IN AEPB: 1.0000 | INHALATION_SPRAY | Freq: Every day | RESPIRATORY_TRACT | 5 refills | Status: DC

## 2018-04-26 NOTE — Assessment & Plan Note (Signed)
Chronic insomnia, recent worse d/t COPD among other concerns Seems multifactorial etiology, some degree of underlying depression likely affecting his symptoms. On chronic BDZ with Xanax Limited use or not tried SSRI or other anti depressant or more maintenance med  Plan Agree reduce dose Xanax from 0.5 to 0.25mg  BID for now then taper down, cautiously avoid withdrawal, he agrees and not helping as much anymore, future may need SSRI or Trazodone

## 2018-04-26 NOTE — Assessment & Plan Note (Signed)
Suspected etiology for his midline abdominal pain, consistent with last imaging with moderate stool burden, exam today with distention and slow bowel sounds and history of constipation - Trial on miralax, sent rx, dosing given in handout, improve water hydration - Follow-up as planned if not improving, return criteria given

## 2018-04-26 NOTE — Assessment & Plan Note (Addendum)
Still has chronic problem primary issue with dyspnea on exertion, generalized weakness likely related - Never started Breo maintenance, not returning to [facility] or PFTs either by his request, declines to proceed with this treatment - He is limited by cost for Breo - Continues to smoke, not ready to quit - Other comorbidities affecting his breathing including dCHF, Pulm HTN, AFib among other cardiovascular concerns  Plan Again, long discussion on his progressive dyspnea and dx of COPD - Advised he needs maintenance inhaler - can contact pharmacy and insurance to determine Breo cost or other alternatives and see if he meets financial assistance criteria - will work on this w/ staff and contact him - Use Albuterol PRN, limit regular use - Recommend quit smoking - He did not return to [place] and see different provider - Concerns with medical non adherence to recommendations - Follow-up as planned 3 months - he will likely benefit from above recommended ICS maintenance, Pulmonology management PFTs, possible future supplemental O2  UPDATE 04/26/18 - next day Our staff checked with pharmacy/insurance and determined Breo cost should be $45 for him also Incruse is $45. He may consider [facility] and also possibly submit financial aid request app to manufacturer. He agrees to pay the $45 to try Breo - I advised him about it being maintenance dose take one daily for at least 1 month to see if it is effective, refills given, goal to reduce his dyspnea and flares, and he will use Albuterol PRN only. Will follow-up if not improving or other issues, he was appreciative of the phone call.

## 2018-04-26 NOTE — Addendum Note (Signed)
Addended by: [name] on: 04/26/2018 11:44 AM   Modules accepted: Orders

## 2018-04-30 ENCOUNTER — Encounter: Payer: Self-pay | Admitting: Emergency Medicine

## 2018-04-30 ENCOUNTER — Emergency Department: Payer: Medicare HMO

## 2018-04-30 ENCOUNTER — Inpatient Hospital Stay
Admission: EM | Admit: 2018-04-30 | Discharge: 2018-05-02 | DRG: 291 | Disposition: A | Discharge status: Other Acute Inpatient Hospital | Payer: Medicare HMO | Attending: Internal Medicine | Admitting: Internal Medicine

## 2018-04-30 ENCOUNTER — Other Ambulatory Visit: Payer: Self-pay

## 2018-04-30 DIAGNOSIS — I4581 Long QT syndrome: Secondary | ICD-10-CM | POA: Diagnosis present

## 2018-04-30 DIAGNOSIS — J449 Chronic obstructive pulmonary disease, unspecified: Secondary | ICD-10-CM | POA: Diagnosis present

## 2018-04-30 DIAGNOSIS — E785 Hyperlipidemia, unspecified: Secondary | ICD-10-CM | POA: Diagnosis present

## 2018-04-30 DIAGNOSIS — I429 Cardiomyopathy, unspecified: Secondary | ICD-10-CM | POA: Diagnosis present

## 2018-04-30 DIAGNOSIS — I481 Persistent atrial fibrillation: Secondary | ICD-10-CM | POA: Diagnosis present

## 2018-04-30 DIAGNOSIS — R0902 Hypoxemia: Secondary | ICD-10-CM | POA: Diagnosis present

## 2018-04-30 DIAGNOSIS — Z881 Allergy status to other antibiotic agents status: Secondary | ICD-10-CM

## 2018-04-30 DIAGNOSIS — Z952 Presence of prosthetic heart valve: Secondary | ICD-10-CM

## 2018-04-30 DIAGNOSIS — Z86718 Personal history of other venous thrombosis and embolism: Secondary | ICD-10-CM

## 2018-04-30 DIAGNOSIS — Z885 Allergy status to narcotic agent status: Secondary | ICD-10-CM

## 2018-04-30 DIAGNOSIS — I5043 Acute on chronic combined systolic (congestive) and diastolic (congestive) heart failure: Secondary | ICD-10-CM | POA: Diagnosis not present

## 2018-04-30 DIAGNOSIS — Z9114 Patient's other noncompliance with medication regimen: Secondary | ICD-10-CM

## 2018-04-30 DIAGNOSIS — J9601 Acute respiratory failure with hypoxia: Secondary | ICD-10-CM | POA: Diagnosis present

## 2018-04-30 DIAGNOSIS — I110 Hypertensive heart disease with heart failure: Secondary | ICD-10-CM | POA: Diagnosis not present

## 2018-04-30 DIAGNOSIS — I509 Heart failure, unspecified: Secondary | ICD-10-CM

## 2018-04-30 DIAGNOSIS — Z87442 Personal history of urinary calculi: Secondary | ICD-10-CM

## 2018-04-30 DIAGNOSIS — R000 Tachycardia, unspecified: Secondary | ICD-10-CM

## 2018-04-30 DIAGNOSIS — Z7951 Long term (current) use of inhaled steroids: Secondary | ICD-10-CM

## 2018-04-30 DIAGNOSIS — F17210 Nicotine dependence, cigarettes, uncomplicated: Secondary | ICD-10-CM | POA: Diagnosis present

## 2018-04-30 DIAGNOSIS — Z823 Family history of stroke: Secondary | ICD-10-CM

## 2018-04-30 DIAGNOSIS — I2510 Atherosclerotic heart disease of native coronary artery without angina pectoris: Secondary | ICD-10-CM | POA: Diagnosis present

## 2018-04-30 DIAGNOSIS — R739 Hyperglycemia, unspecified: Secondary | ICD-10-CM | POA: Diagnosis present

## 2018-04-30 DIAGNOSIS — E876 Hypokalemia: Secondary | ICD-10-CM | POA: Diagnosis present

## 2018-04-30 DIAGNOSIS — I248 Other forms of acute ischemic heart disease: Secondary | ICD-10-CM | POA: Diagnosis present

## 2018-04-30 DIAGNOSIS — I440 Atrioventricular block, first degree: Secondary | ICD-10-CM | POA: Diagnosis present

## 2018-04-30 DIAGNOSIS — Z8673 Personal history of transient ischemic attack (TIA), and cerebral infarction without residual deficits: Secondary | ICD-10-CM

## 2018-04-30 LAB — CBC WITH DIFFERENTIAL/PLATELET
Basophils Absolute: 0.1 10*3/uL (ref 0–0.1)
Basophils Relative: 1 %
Eosinophils Absolute: 0.1 10*3/uL (ref 0–0.7)
Eosinophils Relative: 2 %
HCT: 44.5 % (ref 40.0–52.0)
Hemoglobin: 14.9 g/dL (ref 13.0–18.0)
Lymphocytes Relative: 18 %
Lymphs Abs: 0.9 10*3/uL — ABNORMAL LOW (ref 1.0–3.6)
MCH: 31.1 pg (ref 26.0–34.0)
MCHC: 33.4 g/dL (ref 32.0–36.0)
MCV: 92.8 fL (ref 80.0–100.0)
Monocytes Absolute: 0.4 10*3/uL (ref 0.2–1.0)
Monocytes Relative: 7 %
Neutro Abs: 3.6 10*3/uL (ref 1.4–6.5)
Neutrophils Relative %: 72 %
Platelets: 153 10*3/uL (ref 150–440)
RBC: 4.79 MIL/uL (ref 4.40–5.90)
RDW: 14.7 % — ABNORMAL HIGH (ref 11.5–14.5)
WBC: 5 10*3/uL (ref 3.8–10.6)

## 2018-04-30 LAB — TROPONIN I
Troponin I: <0.03 ng/mL (ref <0.03)
Troponin I: 0.03 ng/mL (ref <0.03)

## 2018-04-30 LAB — BASIC METABOLIC PANEL
Anion gap: 7 (ref 5–15)
BUN: 9 mg/dL (ref 6–20)
CO2: 25 mmol/L (ref 22–32)
Calcium: 8.5 mg/dL — ABNORMAL LOW (ref 8.9–10.3)
Chloride: 105 mmol/L (ref 101–111)
Creatinine, Ser: 1.05 mg/dL (ref 0.61–1.24)
GFR calc Af Amer: >60 mL/min (ref >60)
GFR calc non Af Amer: >60 mL/min (ref >60)
Glucose, Bld: 109 mg/dL — ABNORMAL HIGH (ref 65–99)
Potassium: 3.5 mmol/L (ref 3.5–5.1)
Sodium: 137 mmol/L (ref 135–145)

## 2018-04-30 LAB — BRAIN NATRIURETIC PEPTIDE: B Natriuretic Peptide: 527 pg/mL — ABNORMAL HIGH (ref 0.0–100.0)

## 2018-04-30 MED ORDER — FAMOTIDINE IN NACL 20-0.9 MG/50ML-% IV SOLN
20.0000 mg | Freq: Two times a day (BID) | INTRAVENOUS | Status: DC
  Administered 2018-04-30 – 2018-05-01 (×2): 20 mg via INTRAVENOUS
  Filled 2018-04-30 (×2): qty 50

## 2018-04-30 MED ORDER — DOCUSATE SODIUM 100 MG PO CAPS
100.0000 mg | ORAL_CAPSULE | Freq: Two times a day (BID) | ORAL | Status: DC
  Administered 2018-04-30 – 2018-05-02 (×2): 100 mg via ORAL
  Filled 2018-04-30 (×3): qty 1

## 2018-04-30 MED ORDER — POTASSIUM CHLORIDE CRYS ER 20 MEQ PO TBCR
20.0000 meq | EXTENDED_RELEASE_TABLET | Freq: Two times a day (BID) | ORAL | Status: DC
  Administered 2018-04-30 – 2018-05-02 (×4): 20 meq via ORAL
  Filled 2018-04-30 (×4): qty 1

## 2018-04-30 MED ORDER — SODIUM CHLORIDE 0.9 % IV SOLN: 250.0000 mL | INTRAVENOUS | Status: DC | PRN

## 2018-04-30 MED ORDER — SODIUM CHLORIDE 0.9% FLUSH: 3.0000 mL | INTRAVENOUS | Status: DC | PRN

## 2018-04-30 MED ORDER — METOPROLOL TARTRATE 25 MG PO TABS
25.0000 mg | ORAL_TABLET | Freq: Three times a day (TID) | ORAL | Status: DC
  Administered 2018-05-01: 25 mg via ORAL
  Filled 2018-04-30: qty 1

## 2018-04-30 MED ORDER — ONDANSETRON HCL 4 MG PO TABS: 4.0000 mg | ORAL_TABLET | Freq: Four times a day (QID) | ORAL | Status: DC | PRN

## 2018-04-30 MED ORDER — BISACODYL 10 MG RE SUPP: 10.0000 mg | Freq: Every day | RECTAL | Status: DC | PRN

## 2018-04-30 MED ORDER — ACETAMINOPHEN 325 MG PO TABS: 650.0000 mg | ORAL_TABLET | Freq: Four times a day (QID) | ORAL | Status: DC | PRN

## 2018-04-30 MED ORDER — FUROSEMIDE 10 MG/ML IJ SOLN
40.0000 mg | Freq: Once | INTRAMUSCULAR | Status: AC
  Administered 2018-04-30: 40 mg via INTRAVENOUS
  Filled 2018-04-30: qty 4

## 2018-04-30 MED ORDER — ALBUTEROL SULFATE (2.5 MG/3ML) 0.083% IN NEBU: 2.5000 mg | INHALATION_SOLUTION | Freq: Four times a day (QID) | RESPIRATORY_TRACT | Status: DC | PRN

## 2018-04-30 MED ORDER — FUROSEMIDE 10 MG/ML IJ SOLN
40.0000 mg | Freq: Two times a day (BID) | INTRAMUSCULAR | Status: DC
  Administered 2018-05-01 – 2018-05-02 (×3): 40 mg via INTRAVENOUS
  Filled 2018-04-30 (×3): qty 4

## 2018-04-30 MED ORDER — ENOXAPARIN SODIUM 40 MG/0.4ML ~~LOC~~ SOLN
40.0000 mg | SUBCUTANEOUS | Status: DC
  Administered 2018-04-30: 40 mg via SUBCUTANEOUS
  Filled 2018-04-30: qty 0.4

## 2018-04-30 MED ORDER — ALPRAZOLAM 0.25 MG PO TABS
0.2500 mg | ORAL_TABLET | Freq: Two times a day (BID) | ORAL | Status: DC | PRN
  Administered 2018-05-01 – 2018-05-02 (×2): 0.25 mg via ORAL
  Filled 2018-04-30 (×2): qty 1

## 2018-04-30 MED ORDER — POLYETHYLENE GLYCOL 3350 17 G PO PACK: 17.0000 g | PACK | Freq: Every day | ORAL | Status: DC | PRN

## 2018-04-30 MED ORDER — IPRATROPIUM-ALBUTEROL 0.5-2.5 (3) MG/3ML IN SOLN
3.0000 mL | Freq: Four times a day (QID) | RESPIRATORY_TRACT | Status: DC
  Administered 2018-04-30 – 2018-05-01 (×5): 3 mL via RESPIRATORY_TRACT
  Filled 2018-04-30 (×6): qty 3

## 2018-04-30 MED ORDER — FLUTICASONE FUROATE-VILANTEROL 100-25 MCG/INH IN AEPB
1.0000 | INHALATION_SPRAY | Freq: Every day | RESPIRATORY_TRACT | Status: DC
Start: 2018-04-30 — End: 2018-05-02
  Administered 2018-04-30 – 2018-05-02 (×3): 1 via RESPIRATORY_TRACT
  Filled 2018-04-30: qty 28

## 2018-04-30 MED ORDER — ACETAMINOPHEN 650 MG RE SUPP: 650.0000 mg | Freq: Four times a day (QID) | RECTAL | Status: DC | PRN

## 2018-04-30 MED ORDER — SODIUM CHLORIDE 0.9% FLUSH
3.0000 mL | Freq: Two times a day (BID) | INTRAVENOUS | Status: DC
  Administered 2018-04-30 – 2018-05-02 (×4): 3 mL via INTRAVENOUS

## 2018-04-30 MED ORDER — POLYETHYLENE GLYCOL 3350 17 GM/SCOOP PO POWD
17.0000 g | Freq: Every day | ORAL | Status: DC | PRN
  Filled 2018-04-30 (×2): qty 255

## 2018-04-30 MED ORDER — ONDANSETRON HCL 4 MG/2ML IJ SOLN: 4.0000 mg | Freq: Four times a day (QID) | INTRAMUSCULAR | Status: DC | PRN

## 2018-04-30 MED ORDER — ASPIRIN EC 81 MG PO TBEC
81.0000 mg | DELAYED_RELEASE_TABLET | Freq: Every day | ORAL | Status: DC
  Administered 2018-04-30 – 2018-05-02 (×3): 81 mg via ORAL
  Filled 2018-04-30 (×3): qty 1

## 2018-04-30 NOTE — ED Notes (Signed)
Pt to xray

## 2018-04-30 NOTE — ED Provider Notes (Addendum)
[facility] Emergency Department Provider Note  ____________________________________________  Time seen: Approximately 2:46 PM  I have reviewed the triage vital signs and the nursing notes.   HISTORY  Chief Complaint Chest Pain   HPI Michael Delacruz is a 77 y.o. male with a heart history of paroxysmal atrial fibrillation not on blood thinners or rate control agent, mitral valve replacement, coronary artery disease, CHF with a EF of 40%, smoking, COPD, hypertension who presents for evaluation of shortness of breath.  Patient endorses a few days of cough productive of clear sputum.  This morning he woke up with shortness of breath.  The shortness of breath is mild at rest and moderate with exertion.  He denies fever or chills.  He denies chest pain.  He denies weight gain or orthopnea.  He denies personal or family history of blood clots, recent travel immobilization, leg pain or swelling, hemoptysis, or history of cancer.  Past Medical History:  Diagnosis Date  . Arthritis   . Asthma   . Bradycardia   . CAD in native artery    a. LHC 09/2015: 40% pCx, 35% mRCA.  [name] Chronic diastolic congestive heart failure ([place])   . COPD (chronic obstructive pulmonary disease) ([place])   . Depression   . Dilation of intestine 01/2015  . Fibromyalgia   . Frequent headaches   . GERD (gastroesophageal reflux disease)   . History of blood clots    eye   . History of hiatal hernia   . History of rheumatic fever   . Hypertension   . Kidney stone   . PAF (paroxysmal atrial fibrillation) ([place])    a. s/p TEE/DCCV 07/2015. b. H/o bleeding on Coumadin when INR >5, changed to Eliquis.  . S/P Minimally invasive maze operation for atrial fibrillation 10/22/2015   Complete bilateral atrial lesion set using cryothermy and bipolar radiofrequency ablation with clipping of LA appendage via right mini thoracotomy approach  . S/P minimally invasive mitral valve repair 10/22/2015   Complex  valvuloplasty including triangular resection of posterior leaflet, artificial Gore-tex neochord placement x6 and 38 mm Sorin Memo 3D Rechord ring annuloplasty via right minithoracotomy approach  . Severe mitral regurgitation    a. s/p MV repair 10/2015.  [name] Sleep apnea   . Stroke ([place])   . Thyroid disorder   . TIA (transient ischemic attack)   . Tobacco abuse     Patient Active Problem List   Diagnosis Date Noted  . Constipation 04/25/2018  . Insomnia due to medical condition 02/22/2018  . Mild recurrent major depression ([place]) 01/16/2018  . Somatic symptom disorder 01/16/2018  . Vision loss 12/31/2015  . TIA (transient ischemic attack) 12/06/2015  . S/P MVR (mitral valve repair) 11/11/2015  . S/P Minimally invasive maze operation for atrial fibrillation 10/22/2015  . Angina pectoris ([place]) 10/03/2015  . Chronic diastolic congestive heart failure ([place])   . Atrial fibrillation ([place]) 08/15/2015  . Centrilobular emphysema ([place])   . Shortness of breath   . Hypotension 11/02/2012  . Stress at home 11/02/2012  . Leg pain 11/02/2012  . Smoking 04/27/2012  . Hyperlipidemia 04/27/2012  . CAD (coronary artery disease) 04/27/2012  . Heart palpitations 09/03/2011  . Dizziness 05/19/2011  . SOB (shortness of breath) 05/05/2011  . MVP (mitral valve prolapse) 05/05/2011  . Mitral valve regurgitation 05/05/2011  . COPD (chronic obstructive pulmonary disease) ([place]) 05/05/2011  . Pulmonary HTN ([place]) 05/05/2011  . CHEST PAIN UNSPECIFIED 01/04/2011    Past Surgical History:  Procedure Laterality Date  . ANKLE SURGERY    . CARDIAC CATHETERIZATION N/A 10/03/2015   Procedure: Right and Left Heart Cath and Coronary Angiography;  Surgeon: [name], MD;  Location: [place] [facility];  Service: Cardiovascular;  Laterality: N/A;  . COLONOSCOPY    . ELECTROPHYSIOLOGIC STUDY N/A 08/18/2015   Procedure: CARDIOVERSION;  Surgeon: Wellington Hampshire, MD;  Location: [facility];  Service:  Cardiovascular;  Laterality: N/A;  . MINIMALLY INVASIVE MAZE PROCEDURE N/A 10/22/2015   Procedure: MINIMALLY INVASIVE MAZE PROCEDURE;  Surgeon: Rexene Alberts, MD;  Location: [place];  Service: Open Heart Surgery;  Laterality: N/A;  . MITRAL VALVE REPAIR Right 10/22/2015   Procedure: MINIMALLY INVASIVE MITRAL VALVE REPAIR (MVR);  Surgeon: Rexene Alberts, MD;  Location: [place];  Service: Open Heart Surgery;  Laterality: Right;  . SINUS EXPLORATION    . TEE WITHOUT CARDIOVERSION N/A 08/18/2015   Procedure: TRANSESOPHAGEAL ECHOCARDIOGRAM (TEE);  Surgeon: Wellington Hampshire, MD;  Location: [facility];  Service: Cardiovascular;  Laterality: N/A;  . TEE WITHOUT CARDIOVERSION N/A 10/22/2015   Procedure: TRANSESOPHAGEAL ECHOCARDIOGRAM (TEE);  Surgeon: Rexene Alberts, MD;  Location: [place];  Service: Open Heart Surgery;  Laterality: N/A;    Prior to Admission medications   Medication Sig Start Date End Date Taking? Authorizing Provider  albuterol (PROVENTIL HFA;VENTOLIN HFA) 108 (90 Base) MCG/ACT inhaler Inhale 2 puffs into the lungs every 6 (six) hours as needed for wheezing or shortness of breath. 01/24/18  Yes Gollan, Kathlene November, MD  ALPRAZolam Duanne Moron) 0.25 MG tablet Take 1 tablet (0.25 mg total) by mouth 2 (two) times daily as needed for anxiety or sleep. 04/25/18  Yes Karamalegos, Devonne Doughty, DO  furosemide (LASIX) 40 MG tablet Take 1 tablet (40 mg total) by mouth 2 (two) times daily as needed. 09/21/17  Yes Gollan, Kathlene November, MD  polyethylene glycol powder (GLYCOLAX/MIRALAX) powder Take 17 g by mouth daily as needed for mild constipation or moderate constipation. Start 1 cap for few days, if need increase to 2 caps 04/25/18  Yes Karamalegos, Alexander J, DO  fluticasone furoate-vilanterol (BREO ELLIPTA) 100-25 MCG/INH AEPB Inhale 1 puff into the lungs daily. 04/26/18   Karamalegos, Devonne Doughty, DO  Multiple Vitamin (MULTI-VITAMINS) TABS Take by mouth. 04/28/11   [provider]    Allergies Codeine; Digoxin  and related; Macrodantin [nitrofurantoin macrocrystal]; and Morphine and related  Family History  Problem Relation Age of Onset  . Stroke Mother   . Irregular heart beat Mother   . Heart murmur Brother   . Pulmonary embolism Brother   . Hypertension Other     Social History Social History   Tobacco Use  . Smoking status: Current Every Day Smoker    Packs/day: 0.50    Years: 52.00    Pack years: 26.00    Types: Cigarettes    Last attempt to quit: 02/13/2018    Years since quitting: 0.2  . Smokeless tobacco: Never Used  . Tobacco comment: Resumed smoking, after quit 01/2018  Substance Use Topics  . Alcohol use: No    Alcohol/week: 0.5 oz    Types: 1 Standard drinks or equivalent per week    Comment: rare  . Drug use: No    Review of Systems  Constitutional: Negative for fever. Eyes: Negative for visual changes. ENT: Negative for sore throat. Neck: No neck pain  Cardiovascular: Negative for chest pain. Respiratory: + shortness of breath and cough Gastrointestinal: Negative for abdominal pain, vomiting or  diarrhea. Genitourinary: Negative for dysuria. Musculoskeletal: Negative for back pain. Skin: Negative for rash. Neurological: Negative for headaches, weakness or numbness. Psych: No SI or HI  ____________________________________________   PHYSICAL EXAM:  VITAL SIGNS: Vitals:   04/30/18 1700 04/30/18 1719  BP: 127/89 107/85  Pulse: (!) 117 (!) 116  Resp: 11 (!) 32  Temp:    SpO2: 92% 94%   Constitutional: Alert and oriented. Well appearing and in no apparent distress. HEENT:      Head: Normocephalic and atraumatic.         Eyes: Conjunctivae are normal. Sclera is non-icteric.       Mouth/Throat: Mucous membranes are moist.       Neck: Supple with no signs of meningismus. Cardiovascular: Tachycardic with regular rhythm. No murmurs, gallops, or rubs. 2+ symmetrical distal pulses are present in all extremities. No JVD. Respiratory: Slightly increased work  of breathing, respiratory rate in the mid 20s, setting well on room air, lungs are clear to auscultation with good air movement, no wheezing or crackles  Gastrointestinal: Soft, non tender, and non distended with positive bowel sounds. No rebound or guarding. Musculoskeletal: No pitting edema  neurologic: Normal speech and language. Face is symmetric. Moving all extremities. No gross focal neurologic deficits are appreciated. Skin: Skin is warm, dry and intact. No rash noted. Psychiatric: Mood and affect are normal. Speech and behavior are normal.  ____________________________________________   LABS (all labs ordered are listed, but only abnormal results are displayed)  Labs Reviewed  CBC WITH DIFFERENTIAL/PLATELET - Abnormal; Notable for the following components:      Result Value   RDW 14.7 (*)    Lymphs Abs 0.9 (*)    All other components within normal limits  BASIC METABOLIC PANEL - Abnormal; Notable for the following components:   Glucose, Bld 109 (*)    Calcium 8.5 (*)    All other components within normal limits  BRAIN NATRIURETIC PEPTIDE - Abnormal; Notable for the following components:   B Natriuretic Peptide 527.0 (*)    All other components within normal limits  TROPONIN I   ____________________________________________  EKG  ED ECG REPORT I, Rudene Re, the attending physician, personally viewed and interpreted this ECG.  Sinus tachycardia, rate of 117, first-degree AV block, prolonged QTC, normal axis, ST depressions in V4 and V5 with no ST elevations.  Unchanged from prior. ____________________________________________  RADIOLOGY  I have personally reviewed the images performed during this visit and I agree with the Radiologist's read.   Interpretation by Radiologist:  Dg Chest 2 View  Result Date: 04/30/2018 CLINICAL DATA:  Central chest pain and shortness of breath today, history stroke, TIA, atrial fibrillation, hypertension, COPD, asthma, smoker  EXAM: CHEST - 2 VIEW COMPARISON:  01/16/2018 FINDINGS: Prior MVR and LEFT atrial appendage clipping. Enlargement of cardiac silhouette with mild pulmonary vascular congestion. Atherosclerotic calcification aorta. Bronchitic and emphysematous changes. Chronic interstitial prominence, little changed. No segmental consolidation, pleural effusion or pneumothorax. Bones diffusely demineralized. IMPRESSION: Enlargement of cardiac silhouette with pulmonary vascular congestion. COPD changes with chronic interstitial prominence, could be related to mild chronic failure but no superimposed acute infiltrate is identified. Electronically Signed   By: Lavonia Dana M.D.   On: 04/30/2018 15:05      ____________________________________________   PROCEDURES  Procedure(s) performed: None Procedures Critical Care performed:  Yes  CRITICAL CARE Performed by: Rudene Re  ?  Total critical care time: 30 min  Critical care time was exclusive of separately billable procedures and treating  other patients.  Critical care was necessary to treat or prevent imminent or life-threatening deterioration.  Critical care was time spent personally by me on the following activities: development of treatment plan with patient and/or surrogate as well as nursing, discussions with consultants, evaluation of patient's response to treatment, examination of patient, obtaining history from patient or surrogate, ordering and performing treatments and interventions, ordering and review of laboratory studies, ordering and review of radiographic studies, pulse oximetry and re-evaluation of patient's condition.  ____________________________________________   INITIAL IMPRESSION / ASSESSMENT AND PLAN / ED COURSE  77 y.o. male with a heart history of paroxysmal atrial fibrillation not on blood thinners or rate control agent, mitral valve replacement, coronary artery disease, CHF with a EF of 40%, smoking, COPD, hypertension who  presents for evaluation of shortness of breath and cough.  Patient with mildly increased work of breathing, tachypneic to the mid 20s, normal sats, lungs are clear to auscultation with good air movement. EKG showing sinus tachycardia with ST depressions unchanged from prior. Ddx bronchitis, COPD, CHF, PNA, PE. Labs, CXR pending.    _________________________ 5:27 PM on 04/30/2018 -----------------------------------------  Labs and imaging consistent with CHF exacerbation.  Patient received 40mg  of IV Lasix with close to 1 liter urine output.  He was ambulated after that and desatted to 89% with a respiratory rate in the mid 40s.  Therefore decision was made to admit patient for further diuresis and monitoring.   As part of my medical decision making, I reviewed the following data within the [place] notes reviewed and incorporated, Labs reviewed , EKG interpreted , Old EKG reviewed, Old chart reviewed, Radiograph reviewed , Discussed with admitting physician , Notes from prior ED visits and [place] Controlled Substance Database    Pertinent labs & imaging results that were available during my care of the patient were reviewed by me and considered in my medical decision making (see chart for details).    ____________________________________________   FINAL CLINICAL IMPRESSION(S) / ED DIAGNOSES  Final diagnoses:  Acute respiratory failure with hypoxia (HCC)  Acute on chronic congestive heart failure, unspecified heart failure type ([place])      NEW MEDICATIONS STARTED DURING THIS VISIT:  ED Discharge Orders    None       Note:  This document was prepared using Dragon voice recognition software and may include unintentional dictation errors.    Alfred Levins, [place], MD 04/30/18 [place], [place], MD 04/30/18 [phone]

## 2018-04-30 NOTE — ED Notes (Signed)
Unable to call report due to protected time from [phone]

## 2018-04-30 NOTE — H&P (Signed)
History and Physical    Michael Delacruz YCX:[id number] DOB: Nov 05, 1941 DOA: 04/30/2018  Referring physician: Dr. Alfred Levins PCP: [name], DO  Specialists: none  Chief Complaint: SOB  HPI: Michael Delacruz is a 77 y.o. male has a past medical history significant for CAD, COPD, and HTN now to ER with 2-3 day hx of worsening SOB. In ER, pt is hypoxic with CHF on CXR and elevation of BNP. Tachycardic. Denies CP or palpitations. No N/V/D. He is now admitted.  Review of Systems: The patient denies anorexia, fever, weight loss,, vision loss, decreased hearing, hoarseness, chest pain, syncope, peripheral edema, balance deficits, hemoptysis, abdominal pain, melena, hematochezia, severe indigestion/heartburn, hematuria, incontinence, genital sores, muscle weakness, suspicious skin lesions, transient blindness, difficulty walking, depression, unusual weight change, abnormal bleeding, enlarged lymph nodes, angioedema, and breast masses.   Past Medical History:  Diagnosis Date  . Arthritis   . Asthma   . Bradycardia   . CAD in native artery    a. LHC 09/2015: 40% pCx, 35% mRCA.  [name] Chronic diastolic congestive heart failure ([place])   . COPD (chronic obstructive pulmonary disease) ([place])   . Depression   . Dilation of intestine 01/2015  . Fibromyalgia   . Frequent headaches   . GERD (gastroesophageal reflux disease)   . History of blood clots    eye   . History of hiatal hernia   . History of rheumatic fever   . Hypertension   . Kidney stone   . PAF (paroxysmal atrial fibrillation) ([place])    a. s/p TEE/DCCV 07/2015. b. H/o bleeding on Coumadin when INR >5, changed to Eliquis.  . S/P Minimally invasive maze operation for atrial fibrillation 10/22/2015   Complete bilateral atrial lesion set using cryothermy and bipolar radiofrequency ablation with clipping of LA appendage via right mini thoracotomy approach  . S/P minimally invasive mitral valve repair 10/22/2015   Complex  valvuloplasty including triangular resection of posterior leaflet, artificial Gore-tex neochord placement x6 and 38 mm Sorin Memo 3D Rechord ring annuloplasty via right minithoracotomy approach  . Severe mitral regurgitation    a. s/p MV repair 10/2015.  [name] Sleep apnea   . Stroke ([place])   . Thyroid disorder   . TIA (transient ischemic attack)   . Tobacco abuse    Past Surgical History:  Procedure Laterality Date  . ANKLE SURGERY    . CARDIAC CATHETERIZATION N/A 10/03/2015   Procedure: Right and Left Heart Cath and Coronary Angiography;  Surgeon: [name], MD;  Location: [facility];  Service: Cardiovascular;  Laterality: N/A;  . COLONOSCOPY    . ELECTROPHYSIOLOGIC STUDY N/A 08/18/2015   Procedure: CARDIOVERSION;  Surgeon: Wellington Hampshire, MD;  Location: [facility];  Service: Cardiovascular;  Laterality: N/A;  . MINIMALLY INVASIVE MAZE PROCEDURE N/A 10/22/2015   Procedure: MINIMALLY INVASIVE MAZE PROCEDURE;  Surgeon: Rexene Alberts, MD;  Location: [place] [facility];  Service: Open Heart Surgery;  Laterality: N/A;  . MITRAL VALVE REPAIR Right 10/22/2015   Procedure: MINIMALLY INVASIVE MITRAL VALVE REPAIR (MVR);  Surgeon: Rexene Alberts, MD;  Location: [facility];  Service: Open Heart Surgery;  Laterality: Right;  . SINUS EXPLORATION    . TEE WITHOUT CARDIOVERSION N/A 08/18/2015   Procedure: TRANSESOPHAGEAL ECHOCARDIOGRAM (TEE);  Surgeon: Wellington Hampshire, MD;  Location: [facility];  Service: Cardiovascular;  Laterality: N/A;  . TEE WITHOUT CARDIOVERSION N/A 10/22/2015   Procedure: TRANSESOPHAGEAL ECHOCARDIOGRAM (TEE);  Surgeon: Rexene Alberts, MD;  Location: [place];  Service: Open Heart Surgery;  Laterality: N/A;   Social History:  reports that he has been smoking cigarettes.  He has a 26.00 pack-year smoking history. He has never used smokeless tobacco. He reports that he does not drink alcohol or use drugs.  Allergies  Allergen Reactions  . Codeine Nausea Only  . Digoxin And Related     SOB,  bad dreams  . Macrodantin [Nitrofurantoin Macrocrystal] Rash  . Morphine And Related Rash    Family History  Problem Relation Age of Onset  . Stroke Mother   . Irregular heart beat Mother   . Heart murmur Brother   . Pulmonary embolism Brother   . Hypertension Other     Prior to Admission medications   Medication Sig Start Date End Date Taking? Authorizing Provider  albuterol (PROVENTIL HFA;VENTOLIN HFA) 108 (90 Base) MCG/ACT inhaler Inhale 2 puffs into the lungs every 6 (six) hours as needed for wheezing or shortness of breath. 01/24/18  Yes Gollan, Kathlene November, MD  ALPRAZolam Duanne Moron) 0.25 MG tablet Take 1 tablet (0.25 mg total) by mouth 2 (two) times daily as needed for anxiety or sleep. 04/25/18  Yes Karamalegos, Alexander J, DO  fluticasone furoate-vilanterol (BREO ELLIPTA) 100-25 MCG/INH AEPB Inhale 1 puff into the lungs daily. 04/26/18  Yes Karamalegos, Devonne Doughty, DO  furosemide (LASIX) 40 MG tablet Take 1 tablet (40 mg total) by mouth 2 (two) times daily as needed. 09/21/17  Yes [name], MD  Multiple Vitamin (MULTI-VITAMINS) TABS Take 1 tablet by mouth daily.  04/28/11  Yes [provider]  polyethylene glycol powder (GLYCOLAX/MIRALAX) powder Take 17 g by mouth daily as needed for mild constipation or moderate constipation. Start 1 cap for few days, if need increase to 2 caps 04/25/18  Yes [name], DO   Physical Exam: Vitals:   04/30/18 1630 04/30/18 1645 04/30/18 1700 04/30/18 1719  BP: (!) 124/96  127/89 107/85  Pulse: (!) 118  (!) 117 (!) 116  Resp: 18 (!) 27 11 (!) 32  Temp:      TempSrc:      SpO2: 95%  92% 94%  Weight:      Height:         General:  No apparent distress, WDWN, /AT  Eyes: PERRL, EOMI, no scleral icterus, conjunctiva clear  ENT: moist oropharynx without exudate, TM's benign, dentition good  Neck: supple, no lymphadenopathy. No bruits or thyromegaly  Cardiovascular: rapid rate with regular rhythm without MRG; 2+  peripheral pulses, no JVD, no peripheral edema  Respiratory: bilateral rales without wheezes or rhonchi. No dullness. Respiratory effort increased  Abdomen: soft, non tender to palpation, positive bowel sounds, no guarding, no rebound  Skin: no rashes or lesions  Musculoskeletal: normal bulk and tone, no joint swelling  Psychiatric: normal mood and affect, A&OX3  Neurologic: CN 2-12 grossly intact, Motor strength 5/5 in all 4 groups with symmetric DTR's and non-focal sensory exam  Labs on Admission:  Basic Metabolic Panel: Recent Labs  Lab 04/30/18 1458  NA 137  K 3.5  CL 105  CO2 25  GLUCOSE 109*  BUN 9  CREATININE 1.05  CALCIUM 8.5*   Liver Function Tests: No results for input(s): AST, ALT, ALKPHOS, BILITOT, PROT, ALBUMIN in the last 168 hours. No results for input(s): LIPASE, AMYLASE in the last 168 hours. No results for input(s): AMMONIA in the last 168 hours. CBC: Recent Labs  Lab 04/30/18 1458  WBC 5.0  NEUTROABS 3.6  HGB 14.9  HCT 44.5  MCV 92.8  PLT 153   Cardiac Enzymes: Recent Labs  Lab 04/30/18 1458  TROPONINI <0.03    BNP (last 3 results) Recent Labs    01/16/18 1336 04/30/18 1458  BNP 250.0* 527.0*    ProBNP (last 3 results) No results for input(s): PROBNP in the last 8760 hours.  CBG: No results for input(s): GLUCAP in the last 168 hours.  Radiological Exams on Admission: Dg Chest 2 View  Result Date: 04/30/2018 CLINICAL DATA:  Central chest pain and shortness of breath today, history stroke, TIA, atrial fibrillation, hypertension, COPD, asthma, smoker EXAM: CHEST - 2 VIEW COMPARISON:  01/16/2018 FINDINGS: Prior MVR and LEFT atrial appendage clipping. Enlargement of cardiac silhouette with mild pulmonary vascular congestion. Atherosclerotic calcification aorta. Bronchitic and emphysematous changes. Chronic interstitial prominence, little changed. No segmental consolidation, pleural effusion or pneumothorax. Bones diffusely  demineralized. IMPRESSION: Enlargement of cardiac silhouette with pulmonary vascular congestion. COPD changes with chronic interstitial prominence, could be related to mild chronic failure but no superimposed acute infiltrate is identified. Electronically Signed   By: Lavonia Dana M.D.   On: 04/30/2018 15:05    EKG: Independently reviewed.  Assessment/Plan Principal Problem:   CHF (congestive heart failure) (HCC) Active Problems:   COPD (chronic obstructive pulmonary disease) (HCC)   CAD (coronary artery disease)   Tachycardia   Will admit to floor with IV Lasix. Follow enzymes. Echo ordered. Consult Cardiology. Wean O2 as tolerated.  Diet: low salt Fluids: saline lock DVT Prophylaxis: Lovenox  Code Status: FULL  Family Communication: yes  Disposition Plan: home  Time spent: 50 min

## 2018-04-30 NOTE — ED Triage Notes (Signed)
Pt here with intermittent centralized chest pain and shortness of breath today. Pt with hx of COPD, lung sounds clear bilaterally. Also reports pt is always in afib, had an ablation without success.

## 2018-05-01 ENCOUNTER — Observation Stay (HOSPITAL_BASED_OUTPATIENT_CLINIC_OR_DEPARTMENT_OTHER)
Admit: 2018-05-01 | Discharge: 2018-05-01 | Disposition: A | Discharge status: Home / Self Care | Payer: Medicare HMO | Attending: Internal Medicine | Admitting: Internal Medicine

## 2018-05-01 DIAGNOSIS — I340 Nonrheumatic mitral (valve) insufficiency: Secondary | ICD-10-CM | POA: Diagnosis not present

## 2018-05-01 DIAGNOSIS — Z881 Allergy status to other antibiotic agents status: Secondary | ICD-10-CM | POA: Diagnosis not present

## 2018-05-01 DIAGNOSIS — Z86718 Personal history of other venous thrombosis and embolism: Secondary | ICD-10-CM | POA: Diagnosis not present

## 2018-05-01 DIAGNOSIS — I429 Cardiomyopathy, unspecified: Secondary | ICD-10-CM | POA: Diagnosis present

## 2018-05-01 DIAGNOSIS — R739 Hyperglycemia, unspecified: Secondary | ICD-10-CM | POA: Diagnosis present

## 2018-05-01 DIAGNOSIS — I481 Persistent atrial fibrillation: Secondary | ICD-10-CM | POA: Diagnosis present

## 2018-05-01 DIAGNOSIS — F17210 Nicotine dependence, cigarettes, uncomplicated: Secondary | ICD-10-CM | POA: Diagnosis present

## 2018-05-01 DIAGNOSIS — Z7951 Long term (current) use of inhaled steroids: Secondary | ICD-10-CM | POA: Diagnosis not present

## 2018-05-01 DIAGNOSIS — I2510 Atherosclerotic heart disease of native coronary artery without angina pectoris: Secondary | ICD-10-CM | POA: Diagnosis present

## 2018-05-01 DIAGNOSIS — I5023 Acute on chronic systolic (congestive) heart failure: Secondary | ICD-10-CM | POA: Diagnosis not present

## 2018-05-01 DIAGNOSIS — I248 Other forms of acute ischemic heart disease: Secondary | ICD-10-CM | POA: Diagnosis present

## 2018-05-01 DIAGNOSIS — I110 Hypertensive heart disease with heart failure: Secondary | ICD-10-CM | POA: Diagnosis present

## 2018-05-01 DIAGNOSIS — Z952 Presence of prosthetic heart valve: Secondary | ICD-10-CM | POA: Diagnosis not present

## 2018-05-01 DIAGNOSIS — J449 Chronic obstructive pulmonary disease, unspecified: Secondary | ICD-10-CM | POA: Diagnosis present

## 2018-05-01 DIAGNOSIS — Z9114 Patient's other noncompliance with medication regimen: Secondary | ICD-10-CM | POA: Diagnosis not present

## 2018-05-01 DIAGNOSIS — R0902 Hypoxemia: Secondary | ICD-10-CM | POA: Diagnosis present

## 2018-05-01 DIAGNOSIS — I4581 Long QT syndrome: Secondary | ICD-10-CM | POA: Diagnosis present

## 2018-05-01 DIAGNOSIS — I440 Atrioventricular block, first degree: Secondary | ICD-10-CM | POA: Diagnosis present

## 2018-05-01 DIAGNOSIS — Z8673 Personal history of transient ischemic attack (TIA), and cerebral infarction without residual deficits: Secondary | ICD-10-CM | POA: Diagnosis not present

## 2018-05-01 DIAGNOSIS — I5043 Acute on chronic combined systolic (congestive) and diastolic (congestive) heart failure: Secondary | ICD-10-CM | POA: Diagnosis present

## 2018-05-01 DIAGNOSIS — E876 Hypokalemia: Secondary | ICD-10-CM | POA: Diagnosis present

## 2018-05-01 DIAGNOSIS — J9601 Acute respiratory failure with hypoxia: Secondary | ICD-10-CM | POA: Diagnosis present

## 2018-05-01 DIAGNOSIS — R000 Tachycardia, unspecified: Secondary | ICD-10-CM | POA: Diagnosis present

## 2018-05-01 DIAGNOSIS — E785 Hyperlipidemia, unspecified: Secondary | ICD-10-CM | POA: Diagnosis present

## 2018-05-01 DIAGNOSIS — Z885 Allergy status to narcotic agent status: Secondary | ICD-10-CM | POA: Diagnosis not present

## 2018-05-01 DIAGNOSIS — Z87442 Personal history of urinary calculi: Secondary | ICD-10-CM | POA: Diagnosis not present

## 2018-05-01 LAB — COMPREHENSIVE METABOLIC PANEL
ALT: 17 U/L (ref 17–63)
AST: 22 U/L (ref 15–41)
Albumin: 4.1 g/dL (ref 3.5–5.0)
Alkaline Phosphatase: 74 U/L (ref 38–126)
Anion gap: 7 (ref 5–15)
BUN: 12 mg/dL (ref 6–20)
CO2: 23 mmol/L (ref 22–32)
Calcium: 8.6 mg/dL — ABNORMAL LOW (ref 8.9–10.3)
Chloride: 106 mmol/L (ref 101–111)
Creatinine, Ser: 1.1 mg/dL (ref 0.61–1.24)
GFR calc Af Amer: >60 mL/min (ref >60)
GFR calc non Af Amer: >60 mL/min (ref >60)
Glucose, Bld: 150 mg/dL — ABNORMAL HIGH (ref 65–99)
Potassium: 3.4 mmol/L — ABNORMAL LOW (ref 3.5–5.1)
Sodium: 136 mmol/L (ref 135–145)
Total Bilirubin: 1.2 mg/dL (ref 0.3–1.2)
Total Protein: 6.6 g/dL (ref 6.5–8.1)

## 2018-05-01 LAB — CBC
HCT: 45.3 % (ref 40.0–52.0)
Hemoglobin: 15.5 g/dL (ref 13.0–18.0)
MCH: 31.5 pg (ref 26.0–34.0)
MCHC: 34.2 g/dL (ref 32.0–36.0)
MCV: 91.9 fL (ref 80.0–100.0)
Platelets: 155 10*3/uL (ref 150–440)
RBC: 4.93 MIL/uL (ref 4.40–5.90)
RDW: 14.8 % — ABNORMAL HIGH (ref 11.5–14.5)
WBC: 5.3 10*3/uL (ref 3.8–10.6)

## 2018-05-01 LAB — HEMOGLOBIN A1C
Hgb A1c MFr Bld: 5.5 % (ref 4.8–5.6)
Mean Plasma Glucose: 111.15 mg/dL

## 2018-05-01 LAB — LIPID PANEL
Cholesterol: 215 mg/dL — ABNORMAL HIGH (ref 0–200)
HDL: 42 mg/dL (ref >40)
LDL Cholesterol: 157 mg/dL — ABNORMAL HIGH (ref 0–99)
Total CHOL/HDL Ratio: 5.1 RATIO
Triglycerides: 79 mg/dL (ref <150)
VLDL: 16 mg/dL (ref 0–40)

## 2018-05-01 LAB — TROPONIN I
Troponin I: 0.03 ng/mL (ref <0.03)
Troponin I: <0.03 ng/mL (ref <0.03)

## 2018-05-01 LAB — ECHOCARDIOGRAM COMPLETE
Height: 70 in
Weight: 3062.4 oz

## 2018-05-01 LAB — APTT: aPTT: 37 seconds — ABNORMAL HIGH (ref 24–36)

## 2018-05-01 LAB — PROTIME-INR
INR: 1.02
Prothrombin Time: 13.3 seconds (ref 11.4–15.2)

## 2018-05-01 MED ORDER — METOPROLOL TARTRATE 50 MG PO TABS
50.0000 mg | ORAL_TABLET | Freq: Three times a day (TID) | ORAL | Status: DC
  Administered 2018-05-01 – 2018-05-02 (×4): 50 mg via ORAL
  Filled 2018-05-01 (×4): qty 1

## 2018-05-01 MED ORDER — PANTOPRAZOLE SODIUM 40 MG PO TBEC
40.0000 mg | DELAYED_RELEASE_TABLET | Freq: Every day | ORAL | Status: DC
  Administered 2018-05-02: 40 mg via ORAL
  Filled 2018-05-01: qty 1

## 2018-05-01 MED ORDER — ATORVASTATIN CALCIUM 20 MG PO TABS
40.0000 mg | ORAL_TABLET | Freq: Every day | ORAL | Status: DC
  Administered 2018-05-01 – 2018-05-02 (×2): 40 mg via ORAL
  Filled 2018-05-01 (×2): qty 2

## 2018-05-01 MED ORDER — FLUTICASONE PROPIONATE 50 MCG/ACT NA SUSP
2.0000 | Freq: Every day | NASAL | Status: DC
  Administered 2018-05-01 – 2018-05-02 (×2): 2 via NASAL
  Filled 2018-05-01: qty 16

## 2018-05-01 MED ORDER — HEPARIN (PORCINE) IN NACL 100-0.45 UNIT/ML-% IJ SOLN
1200.0000 [IU]/h | INTRAMUSCULAR | Status: DC
  Administered 2018-05-01 – 2018-05-02 (×2): 1200 [IU]/h via INTRAVENOUS
  Filled 2018-05-01 (×2): qty 250

## 2018-05-01 MED ORDER — HEPARIN BOLUS VIA INFUSION
4300.0000 [IU] | Freq: Once | INTRAVENOUS | Status: AC
  Administered 2018-05-01: 4300 [IU] via INTRAVENOUS
  Filled 2018-05-01: qty 4300

## 2018-05-01 NOTE — Progress Notes (Addendum)
NOtified Dr. Jodell Cipro of troponin of 0.03. No new orders.

## 2018-05-01 NOTE — Progress Notes (Addendum)
ANTICOAGULATION CONSULT NOTE - Initial Consult  Pharmacy Consult for Heparin drip Indication: atrial fibrillation  Allergies  Allergen Reactions  . Codeine Nausea Only  . Digoxin And Related     SOB, bad dreams  . Macrodantin [Nitrofurantoin Macrocrystal] Rash  . Morphine And Related Rash    Patient Measurements: Height: 5\' 10"  (177.8 cm) Weight: 191 lb 6.4 oz (86.8 kg) IBW/kg (Calculated) : 73 Heparin Dosing Weight: 86.8 kg  Vital Signs: Temp: 97.6 F (36.4 C) (05/13 0745) Temp Source: Oral (05/13 0745) BP: 115/89 (05/13 1748) Pulse Rate: 113 (05/13 1748)  Labs: Recent Labs    04/30/18 1458 04/30/18 2109 05/01/18 0412 05/01/18 1023  HGB 14.9  --   --  15.5  HCT 44.5  --   --  45.3  PLT 153  --   --  155  CREATININE 1.05  --   --  1.10  TROPONINI <0.03 0.03* 0.03* <0.03    Estimated Creatinine Clearance: 60.8 mL/min (by C-G formula based on SCr of 1.1 mg/dL).   Medical History: Past Medical History:  Diagnosis Date  . Arthritis   . Asthma   . Bradycardia   . CAD in native artery    a. LHC 09/2015: 40% pCx, 35% mRCA.  [name] Chronic diastolic congestive heart failure ([place])   . COPD (chronic obstructive pulmonary disease) ([place])   . Depression   . Dilation of intestine 01/2015  . Fibromyalgia   . Frequent headaches   . GERD (gastroesophageal reflux disease)   . History of blood clots    eye   . History of hiatal hernia   . History of rheumatic fever   . Hypertension   . Kidney stone   . PAF (paroxysmal atrial fibrillation) ([place])    a. s/p TEE/DCCV 07/2015. b. H/o bleeding on Coumadin when INR >5, changed to Eliquis.  . S/P Minimally invasive maze operation for atrial fibrillation 10/22/2015   Complete bilateral atrial lesion set using cryothermy and bipolar radiofrequency ablation with clipping of LA appendage via right mini thoracotomy approach  . S/P minimally invasive mitral valve repair 10/22/2015   Complex valvuloplasty including triangular resection of  posterior leaflet, artificial Gore-tex neochord placement x6 and 38 mm Sorin Memo 3D Rechord ring annuloplasty via right minithoracotomy approach  . Severe mitral regurgitation    a. s/p MV repair 10/2015.  [name] Sleep apnea   . Stroke ([place])   . Thyroid disorder   . TIA (transient ischemic attack)   . Tobacco abuse     Medications:  Scheduled:  . aspirin EC  81 mg Oral Daily  . atorvastatin  40 mg Oral q1800  . docusate sodium  100 mg Oral BID  . fluticasone  2 spray Each Nare Daily  . fluticasone furoate-vilanterol  1 puff Inhalation Daily  . furosemide  40 mg Intravenous BID  . heparin  4,300 Units Intravenous Once  . ipratropium-albuterol  3 mL Nebulization QID  . metoprolol tartrate  50 mg Oral Q8H  . pantoprazole  40 mg Oral Daily  . potassium chloride  20 mEq Oral BID  . sodium chloride flush  3 mL Intravenous Q12H   Infusions:  . sodium chloride    . heparin      Assessment: 77 yo M to start Heparin Drip for Atrial Fibrillation. Patient has refused anticoagulation in the past and is mostly noncompliant except with furosemide.  Hgb 15.5  Plt 155 INR 1.02  APTT 37   Goal of Therapy:  Heparin  level 0.3-0.7 units/ml Monitor platelets by anticoagulation protocol: Yes   Plan:  Give 4300 units bolus x 1 Start heparin infusion at 1200 units/hr Check anti-Xa level in 8 hours and daily while on heparin Continue to monitor H&H and platelets  Jerae Izard A 05/01/2018,7:02 PM

## 2018-05-01 NOTE — Progress Notes (Signed)
[facility] [place] NAME: Michael Delacruz    MR#:  [id number]  DATE OF BIRTH:  03-13-1941  SUBJECTIVE:  CHIEF COMPLAINT:   Chief Complaint  Patient presents with  . Chest Pain    Came with Chest tightness, SOB or orthopnea.  REVIEW OF SYSTEMS:  CONSTITUTIONAL: No fever, fatigue or weakness.  EYES: No blurred or double vision.  EARS, NOSE, AND THROAT: No tinnitus or ear pain.  RESPIRATORY: No cough,have shortness of breath,no wheezing or hemoptysis.  CARDIOVASCULAR: No chest pain, have orthopnea, edema.  GASTROINTESTINAL: No nausea, vomiting, diarrhea or abdominal pain.  GENITOURINARY: No dysuria, hematuria.  ENDOCRINE: No polyuria, nocturia,  HEMATOLOGY: No anemia, easy bruising or bleeding SKIN: No rash or lesion. MUSCULOSKELETAL: No joint pain or arthritis.   NEUROLOGIC: No tingling, numbness, weakness.  PSYCHIATRY: No anxiety or depression.   ROS  DRUG ALLERGIES:   Allergies  Allergen Reactions  . Codeine Nausea Only  . Digoxin And Related     SOB, bad dreams  . Macrodantin [Nitrofurantoin Macrocrystal] Rash  . Morphine And Related Rash    VITALS:  Blood pressure (!) 112/96, pulse (!) 115, temperature 97.6 F (36.4 C), temperature source Oral, resp. rate 18, height 5\' 10"  (1.778 m), weight 86.8 kg (191 lb 6.4 oz), SpO2 98 %.  PHYSICAL EXAMINATION:  GENERAL:  77 y.o.-year-old patient lying in the bed with no acute distress.  EYES: Pupils equal, round, reactive to light and accommodation. No scleral icterus. Extraocular muscles intact.  HEENT: Head atraumatic, normocephalic. Oropharynx and nasopharynx clear.  NECK:  Supple, no jugular venous distention. No thyroid enlargement, no tenderness.  LUNGS: Normal breath sounds bilaterally, no wheezing, b/l crepitation. No use of accessory muscles of respiration.  CARDIOVASCULAR: S1, S2 normal. No murmurs, rubs, or gallops.  ABDOMEN: Soft, nontender, nondistended. Bowel sounds  present. No organomegaly or mass.  EXTREMITIES: No pedal edema, cyanosis, or clubbing.  NEUROLOGIC: Cranial nerves II through XII are intact. Muscle strength 5/5 in all extremities. Sensation intact. Gait not checked.  PSYCHIATRIC: The patient is alert and oriented x 3.  SKIN: No obvious rash, lesion, or ulcer.   Physical Exam LABORATORY PANEL:   CBC Recent Labs  Lab 05/01/18 1023  WBC 5.3  HGB 15.5  HCT 45.3  PLT 155   ------------------------------------------------------------------------------------------------------------------  Chemistries  Recent Labs  Lab 05/01/18 1023  NA 136  K 3.4*  CL 106  CO2 23  GLUCOSE 150*  BUN 12  CREATININE 1.10  CALCIUM 8.6*  AST 22  ALT 17  ALKPHOS 74  BILITOT 1.2   ------------------------------------------------------------------------------------------------------------------  Cardiac Enzymes Recent Labs  Lab 05/01/18 0412 05/01/18 1023  TROPONINI 0.03* <0.03   ------------------------------------------------------------------------------------------------------------------  RADIOLOGY:  Dg Chest 2 View  Result Date: 04/30/2018 CLINICAL DATA:  Central chest pain and shortness of breath today, history stroke, TIA, atrial fibrillation, hypertension, COPD, asthma, smoker EXAM: CHEST - 2 VIEW COMPARISON:  01/16/2018 FINDINGS: Prior MVR and LEFT atrial appendage clipping. Enlargement of cardiac silhouette with mild pulmonary vascular congestion. Atherosclerotic calcification aorta. Bronchitic and emphysematous changes. Chronic interstitial prominence, little changed. No segmental consolidation, pleural effusion or pneumothorax. Bones diffusely demineralized. IMPRESSION: Enlargement of cardiac silhouette with pulmonary vascular congestion. COPD changes with chronic interstitial prominence, could be related to mild chronic failure but no superimposed acute infiltrate is identified. Electronically Signed   By: Lavonia Dana M.D.   On:  04/30/2018 15:05    ASSESSMENT AND PLAN:   Principal Problem:  CHF (congestive heart failure) (HCC) Active Problems:   COPD (chronic obstructive pulmonary disease) (HCC)   CAD (coronary artery disease)   Tachycardia  * Ac on Ch combined CHF   IV laisx   Monitor I/o   Metorpolol,    Appreciated cardio consult.  * Persistent A fib   As per cardio- try to achieve rate control  Anticoagulation per Cardiology   * Elevated Troponin    May be due to demand ischemia secondary to A fib and CHF    Cotn ASA, metoprolol  * Mitral valve repair   Monitor Echo  * Hyperlipidemia   Added atovastatin  * Htn    metoprolol  * hypokalemia    Replace oral.   All the records are reviewed and case discussed with Care Management/Social Workerr. Management plans discussed with the patient, family and they are in agreement.  CODE STATUS: Full.  TOTAL TIME TAKING CARE OF THIS PATIENT: 35 minutes.    POSSIBLE D/C IN 1-2 DAYS, DEPENDING ON CLINICAL CONDITION.   Vaughan Basta M.D on 05/01/2018   Between 7am to 6pm - Pager - [phone]  After 6pm go to [email or web address] - password [email or web address]  Office  [phone]  CC: Primary care physician; [name], DO  Note: This dictation was prepared with Dragon dictation along with smaller phrase technology. Any transcriptional errors that result from this process are unintentional.

## 2018-05-01 NOTE — Plan of Care (Signed)
  Problem: Activity: Goal: Capacity to carry out activities will improve Outcome: Not Progressing Note:  Becomes SOB on exertion, 2LO per [facility]   Problem: Clinical Measurements: Goal: Diagnostic test results will improve Outcome: Progressing Note:  K3.4 Troponins negative BNP 527   Problem: Safety: Goal: Ability to remain free from injury will improve Outcome: Progressing Note:  Fall precautions in place

## 2018-05-01 NOTE — Care Management Obs Status (Signed)
[facility] NOTIFICATION   Patient Details  Name: Michael Delacruz MRN: [id number] Date of Birth: [birth year]   Medicare Observation Status Notification Given:  Yes    Beverly Sessions, RN 05/01/2018, 4:32 PM

## 2018-05-01 NOTE — Consult Note (Signed)
Cardiology Consultation:   Patient ID: [name]; [id number]; March 14, 1941   Admit date: 04/30/2018 Date of Consult: 05/01/2018  Primary Care Provider: [name], DO Primary Cardiologist: [name] Primary Electrophysiologist:  Caryl Comes   Patient Profile:   Michael Delacruz is a 77 y.o. male with a hx of nonobstructive CAD by [place] in 09/2015, chronic combined CHF, persistent/permanent Afib/flutter with RVR not on anticoagulation s/p TEE/DCCV in 2016 s/p MAZE procedure with clipping of the left atrial appendage in 10/2015 previously on Eliquis/warfarin, mitral valve regurgitation with prolapse of the posterior leaflet s/p mitral valve repair in 10/2015, pulmonary hypertension retinal bleed on warfarin, TIA, COPD secondary to tobacco abuse, HTN, noncompliance, fibromyalgia, and GERD who is being seen today for the evaluation of CHF with Afib/flutter with RVR at the request of Dr. Doy Hutching, MD.  History of Present Illness:   Michael Delacruz underwent TTE in 07/2015 that showed an EF of 55-60% with severe MR secondary to posterior leaflet prolapse. LHC in 09/2015 that showed severe MR with 30% proximal and mid LAD stenosis as well as 50% mid LCx stenosis, EF 50%. He underwent MAZE with mitral valve repair and left atrial appendage clipping in 10/2015. Follow up echo in 11/2015 showed an EF of 55-60%, mild aortic root dilatation with mild MR. Echo in 07/2017 showed an EF of 45-50%. Holter in 08/2017 showed a mean heart rate of 123 bpm with a range of 94-135 bpm. He has been followed by EP given his poorly controlled tachycardic rates and has declined AV junction ablation with pacing. He was previously on amiodarone, though he self discontinued this medication due to nightmares. Most recent Holter monitor from 11/2017 showed a mean heart rate of 112 bpm with a range of 94-135 bpm, with 93% of all beats beaing > 100 bpm. At his last follow up in 01/2018, he was noted to only want to take Lasix and  nothing else. He was noted to be in atrial flutter with RVR with a rate of 118 bpm. It was recommended he be treated for bronchitis at that time.   Has noted an intermittent worsening SOB over the past 2 months that became suddenly worse on 5/12 prompting him to come in to the ED. He has been noncompliant with his medications as below. No notes a stable 2-pillow orthopnea with associated early satiety. Cough that is productive to clear to light green sputum. No chest pain. No lower extremity edema.   Upon the patient's arrival to [facility] they were found to have BP 119/104, HR 118 bpm, temp 97.8, oxygen saturation 96% on room air, weight 199 pounds. EKG showed Afib with RVR, CXR showed pulmonary vascular congestion with COPD. Labs showed troponin < 0.03-->0.03-->0.03, BNP 527, SCr 1.05, K+ 3.5, WBC 5.0, HGB 14.9. He was given IV Lasix, metoprolol, ASA, and nebulizer. Documented UOP of 400 mL for the admission to date. Weight this morning noted to be 191 pounds. Currently, he remains in Afib/flutter with RVR with heart rates in the 110s bpm. Feels more SOB this morning. No chest pain. Has only been taking Lasix 40 mg 3-4 times per week for the past several months. Not on anticoagulation (refuses to take), not on ASA.   Past Medical History:  Diagnosis Date  . Arthritis   . Asthma   . Bradycardia   . CAD in native artery    a. LHC 09/2015: 40% pCx, 35% mRCA.  [name] Chronic diastolic congestive heart failure ([place])   .  COPD (chronic obstructive pulmonary disease) ([place])   . Depression   . Dilation of intestine 01/2015  . Fibromyalgia   . Frequent headaches   . GERD (gastroesophageal reflux disease)   . History of blood clots    eye   . History of hiatal hernia   . History of rheumatic fever   . Hypertension   . Kidney stone   . PAF (paroxysmal atrial fibrillation) ([place])    a. s/p TEE/DCCV 07/2015. b. H/o bleeding on Coumadin when INR >5, changed to Eliquis.  . S/P Minimally invasive maze operation for  atrial fibrillation 10/22/2015   Complete bilateral atrial lesion set using cryothermy and bipolar radiofrequency ablation with clipping of LA appendage via right mini thoracotomy approach  . S/P minimally invasive mitral valve repair 10/22/2015   Complex valvuloplasty including triangular resection of posterior leaflet, artificial Gore-tex neochord placement x6 and 38 mm Sorin Memo 3D Rechord ring annuloplasty via right minithoracotomy approach  . Severe mitral regurgitation    a. s/p MV repair 10/2015.  [name] Sleep apnea   . Stroke ([place])   . Thyroid disorder   . TIA (transient ischemic attack)   . Tobacco abuse     Past Surgical History:  Procedure Laterality Date  . ANKLE SURGERY    . CARDIAC CATHETERIZATION N/A 10/03/2015   Procedure: Right and Left Heart Cath and Coronary Angiography;  Surgeon: [name], MD;  Location: [facility];  Service: Cardiovascular;  Laterality: N/A;  . COLONOSCOPY    . ELECTROPHYSIOLOGIC STUDY N/A 08/18/2015   Procedure: CARDIOVERSION;  Surgeon: Wellington Hampshire, MD;  Location: [facility];  Service: Cardiovascular;  Laterality: N/A;  . MINIMALLY INVASIVE MAZE PROCEDURE N/A 10/22/2015   Procedure: MINIMALLY INVASIVE MAZE PROCEDURE;  Surgeon: Rexene Alberts, MD;  Location: [facility];  Service: Open Heart Surgery;  Laterality: N/A;  . MITRAL VALVE REPAIR Right 10/22/2015   Procedure: MINIMALLY INVASIVE MITRAL VALVE REPAIR (MVR);  Surgeon: Rexene Alberts, MD;  Location: [facility];  Service: Open Heart Surgery;  Laterality: Right;  . SINUS EXPLORATION    . TEE WITHOUT CARDIOVERSION N/A 08/18/2015   Procedure: TRANSESOPHAGEAL ECHOCARDIOGRAM (TEE);  Surgeon: Wellington Hampshire, MD;  Location: [facility];  Service: Cardiovascular;  Laterality: N/A;  . TEE WITHOUT CARDIOVERSION N/A 10/22/2015   Procedure: TRANSESOPHAGEAL ECHOCARDIOGRAM (TEE);  Surgeon: Rexene Alberts, MD;  Location: [facility];  Service: Open Heart Surgery;  Laterality: N/A;     Home Meds: Prior to  Admission medications   Medication Sig Start Date End Date Taking? Authorizing Provider  albuterol (PROVENTIL HFA;VENTOLIN HFA) 108 (90 Base) MCG/ACT inhaler Inhale 2 puffs into the lungs every 6 (six) hours as needed for wheezing or shortness of breath. 01/24/18  Yes Gollan, Kathlene November, MD  ALPRAZolam Duanne Moron) 0.25 MG tablet Take 1 tablet (0.25 mg total) by mouth 2 (two) times daily as needed for anxiety or sleep. 04/25/18  Yes Karamalegos, Alexander J, DO  fluticasone furoate-vilanterol (BREO ELLIPTA) 100-25 MCG/INH AEPB Inhale 1 puff into the lungs daily. 04/26/18  Yes Karamalegos, Devonne Doughty, DO  furosemide (LASIX) 40 MG tablet Take 1 tablet (40 mg total) by mouth 2 (two) times daily as needed. 09/21/17  Yes [name], MD  Multiple Vitamin (MULTI-VITAMINS) TABS Take 1 tablet by mouth daily.  04/28/11  Yes [provider]  polyethylene glycol powder (GLYCOLAX/MIRALAX) powder Take 17 g by mouth daily as needed for mild constipation or moderate constipation. Start 1 cap for few days,  if need increase to 2 caps 04/25/18  Yes Karamalegos, Devonne Doughty, DO    Inpatient Medications: Scheduled Meds: . aspirin EC  81 mg Oral Daily  . docusate sodium  100 mg Oral BID  . enoxaparin (LOVENOX) injection  40 mg Subcutaneous Q24H  . fluticasone furoate-vilanterol  1 puff Inhalation Daily  . furosemide  40 mg Intravenous BID  . ipratropium-albuterol  3 mL Nebulization QID  . metoprolol tartrate  25 mg Oral Q8H  . potassium chloride  20 mEq Oral BID  . sodium chloride flush  3 mL Intravenous Q12H   Continuous Infusions: . sodium chloride    . famotidine (PEPCID) IV Stopped (05/01/18 0041)   PRN Meds: sodium chloride, acetaminophen **OR** acetaminophen, albuterol, ALPRAZolam, bisacodyl, ondansetron **OR** ondansetron (ZOFRAN) IV, polyethylene glycol, sodium chloride flush  Allergies:   Allergies  Allergen Reactions  . Codeine Nausea Only  . Digoxin And Related     SOB, bad dreams  .  Macrodantin [Nitrofurantoin Macrocrystal] Rash  . Morphine And Related Rash    Social History:   Social History   Socioeconomic History  . Marital status: Married    Spouse name: Not on file  . Number of children: Not on file  . Years of education: Not on file  . Highest education level: Not on file  Occupational History  . Not on file  Social Needs  . Financial resource strain: Not hard at all  . Food insecurity:    Worry: Never true    Inability: Never true  . Transportation needs:    Medical: No    Non-medical: No  Tobacco Use  . Smoking status: Current Every Day Smoker    Packs/day: 0.50    Years: 52.00    Pack years: 26.00    Types: Cigarettes    Last attempt to quit: 02/13/2018    Years since quitting: 0.2  . Smokeless tobacco: Never Used  . Tobacco comment: Resumed smoking, after quit 01/2018  Substance and Sexual Activity  . Alcohol use: No    Alcohol/week: 0.5 oz    Types: 1 Standard drinks or equivalent per week    Comment: rare  . Drug use: No  . Sexual activity: Not on file  Lifestyle  . Physical activity:    Days per week: 0 days    Minutes per session: 0 min  . Stress: Not at all  Relationships  . Social connections:    Talks on phone: More than three times a week    Gets together: Never    Attends religious service: Never    Active member of club or organization: No    Attends meetings of clubs or organizations: Never    Relationship status: Married  . Intimate partner violence:    Fear of current or ex partner: No    Emotionally abused: No    Physically abused: No    Forced sexual activity: No  Other Topics Concern  . Not on file  Social History Narrative  . Not on file     Family History:   Family History  Problem Relation Age of Onset  . Stroke Mother   . Irregular heart beat Mother   . Heart murmur Brother   . Pulmonary embolism Brother   . Hypertension Other     ROS:  Review of Systems  Constitutional: Positive for  malaise/fatigue. Negative for chills, diaphoresis, fever and weight loss.  HENT: Negative for congestion.   Eyes: Negative for discharge and redness.  Respiratory: Positive for cough, sputum production and shortness of breath. Negative for hemoptysis and wheezing.        Sputum clear to light green  Cardiovascular: Negative for chest pain, palpitations, orthopnea, claudication, leg swelling and PND.  Gastrointestinal: Negative for abdominal pain, blood in stool, heartburn, melena, nausea and vomiting.       Early satiety   Genitourinary: Negative for hematuria.  Musculoskeletal: Negative for falls and myalgias.  Skin: Negative for rash.  Neurological: Positive for weakness. Negative for dizziness, tingling, tremors, sensory change, speech change, focal weakness and loss of consciousness.  Endo/Heme/Allergies: Does not bruise/bleed easily.  Psychiatric/Behavioral: Negative for substance abuse. The patient is not nervous/anxious.   All other systems reviewed and are negative.     Physical Exam/Data:   Vitals:   04/30/18 1856 04/30/18 2013 04/30/18 2123 05/01/18 0523  BP: (!) 121/94 (!) 115/102 (!) 117/94 102/75  Pulse: (!) 118 (!) 117 (!) 110 95  Resp: (!) 23     Temp:  98 F (36.7 C)  97.7 F (36.5 C)  TempSrc:  Oral  Oral  SpO2: 98% 95% 94% 94%  Weight:  191 lb 12.8 oz (87 kg)  191 lb 6.4 oz (86.8 kg)  Height:        Intake/Output Summary (Last 24 hours) at 05/01/2018 0731 Last data filed at 05/01/2018 0532 Gross per 24 hour  Intake -  Output 400 ml  Net -400 ml   Filed Weights   04/30/18 1448 04/30/18 2013 05/01/18 0523  Weight: 199 lb (90.3 kg) 191 lb 12.8 oz (87 kg) 191 lb 6.4 oz (86.8 kg)   Body mass index is 27.46 kg/m.   Physical Exam: General: Well developed, well nourished, in no acute distress. Head: Normocephalic, atraumatic, sclera non-icteric, no xanthomas, nares without discharge.  Neck: Negative for carotid bruits. JVD mildly elevated. Lungs: Diminished  breath sounds bilaterally. Breathing is unlabored. Heart: Tachycardic, irregular with S1 S2. II/VI systolic murmur at the apex, no rubs, or gallops appreciated. Abdomen: Soft, non-tender, non-distended with normoactive bowel sounds. No hepatomegaly. No rebound/guarding. No obvious abdominal masses. Msk:  Strength and tone appear normal for age. Extremities: No clubbing or cyanosis. No edema. Distal pedal pulses are 2+ and equal bilaterally. Neuro: Alert and oriented X 3. No facial asymmetry. No focal deficit. Moves all extremities spontaneously. Psych:  Responds to questions appropriately with a normal affect.   EKG:  The EKG was personally reviewed and demonstrates: Afib with RVR, 117 bpm, nonspecific IVCD, TWI leads I, aVL, V4-V6 Telemetry:  Telemetry was personally reviewed and demonstrates: Afib/flutter with RVR, low 100s to 120s bpm, occasional PVCs  Weights: Filed Weights   04/30/18 1448 04/30/18 2013 05/01/18 0523  Weight: 199 lb (90.3 kg) 191 lb 12.8 oz (87 kg) 191 lb 6.4 oz (86.8 kg)    Relevant CV Studies: 24 hour Holter 11/2017: Indication AFib rate control  Duration 24h Dominant Rhythm    Atrial F Mean HR 112 Range HR 94-135 Total beats 150 K Tachycardia none 93 % >100 bpm Pauses none  Symptoms noted none  Rate control inadequate   TTE 07/2017: Study Conclusions  - Left ventricle: The cavity size was normal. There was moderate   concentric hypertrophy. Systolic function was mildly to   moderately reduced. The estimated ejection fraction was in the   range of 40% to 45%. Images were inadequate for LV wall motion   assessment. The study is not technically sufficient to allow   evaluation of LV diastolic  function. - Aorta: Aortic root dimension: 40 mm (ED). - Aortic root: The aortic root was mildly dilated. - Mitral valve: Prior procedures included surgical repair. The   findings are consistent with mild to moderate stenosis. There was   mild regurgitation.  Mean gradient (D): 6 mm Hg. Valve area by   continuity equation (using LVOT flow): 1.18 cm^2. - Left atrium: The atrium was severely dilated. - Right atrium: The atrium was mildly dilated. - Pulmonary arteries: Systolic pressure could not be accurately   estimated. - Inferior vena cava: The vessel was dilated. The respirophasic   diameter changes were blunted (< 50%), consistent with elevated   central venous pressure.  Laboratory Data:  Chemistry Recent Labs  Lab 04/30/18 1458  NA 137  K 3.5  CL 105  CO2 25  GLUCOSE 109*  BUN 9  CREATININE 1.05  CALCIUM 8.5*  GFRNONAA >60  GFRAA >60  ANIONGAP 7    No results for input(s): PROT, ALBUMIN, AST, ALT, ALKPHOS, BILITOT in the last 168 hours. Hematology Recent Labs  Lab 04/30/18 1458  WBC 5.0  RBC 4.79  HGB 14.9  HCT 44.5  MCV 92.8  MCH 31.1  MCHC 33.4  RDW 14.7*  PLT 153   Cardiac Enzymes Recent Labs  Lab 04/30/18 1458 04/30/18 2109 05/01/18 0412  TROPONINI <0.03 0.03* 0.03*   No results for input(s): TROPIPOC in the last 168 hours.  BNP Recent Labs  Lab 04/30/18 1458  BNP 527.0*    DDimer No results for input(s): DDIMER in the last 168 hours.  Radiology/Studies:  Dg Chest 2 View  Result Date: 04/30/2018 IMPRESSION: Enlargement of cardiac silhouette with pulmonary vascular congestion. COPD changes with chronic interstitial prominence, could be related to mild chronic failure but no superimposed acute infiltrate is identified. Electronically Signed   By: Lavonia Dana M.D.   On: 04/30/2018 15:05    Assessment and Plan:   1. Acute on chronic combined CHF/NICM: -Likely secondary to tachy-mediated cardiomyopathy secondary to noncompliance  -Continue to gentle IV diuresis  -TTE once ventricular rates are better controlled -Escalate evidence-based heart failure therapy if he will agree to take -Lopressor as below -CHF education -Daily weights -Strict Is and Os  2. Persistent/permanent Afib/flutter with  RVR: -Ventricular rates remain poorly controlled -Increase metoprolol to 50 mg q 8 hours with hold parmeters -May need to add diltiazem -Would ideally like to avoid digoxin -Has not been on anticoagulation as he refuses to take, he is aware of stroke risk -CAHDS2VASc at least 6 (CHF, HTN, age x 1, TIA x 2, vascular disease) -Needs EP follow up  -Consider starting heparin gtt/DOAC if he would be agreeable -May need TEE/DCCV if rates continue to be difficult to control, though likelihood of this being successful is low  3. Nonobstructive CAD/elevated troponin: -Never with chest pain -Likely supply demand ischemia in the setting of the above -ASA -Metoprolol  -Check lipid panel for further risk stratification   4. Mitral regurgitation s/p mitral valve repair: -TTE pending  5. Hyperglycemia: -Recommend A1c   For questions or updates, please contact [place] Please consult [email or web address] for contact info under Cardiology/STEMI.   Signed, Christell Faith, PA-C [facility] Pager: [phone] 05/01/2018, 7:31 AM

## 2018-05-01 NOTE — Progress Notes (Signed)
Cardiovascular and Pulmonary Nurse Navigator Note:  77 year old with hx of nonobstructive CAD by LHC in 09/2015, Chronic combined CHF, persistent/permanent afib/flutter with RVR not on anticoagulation s/p TEE/DCCV in 2016 s/p MAZE procedure, mitral valve regurgitation with prolapse of the posterior leaflet s/p mitral valve repair in 10/2015, pulmonary HTN, retinal bleed on warfarin, TIA, COPD secondary to tobacco abuse, HTN, noncompliance, fibromyalgia, and GERD who was admitted with dx CHF with afib/flutter with RVR.     Echo in 07/2017 showed an EF of 45-50%.  Echo today:   Study Result   Result status: Final result                   *[place], [place] [id number]                            [phone]  ------------------------------------------------------------------- Transthoracic Echocardiography  Patient:    Michael Delacruz, Michael Delacruz MR #:       [id number] Study Date: 05/01/2018 Gender:     M Age:        26 Height:     177.8 cm Weight:     86.8 kg BSA:        2.09 m^2 Pt. Status: Room:   ATTENDING    Si Raider  REFERRING    [place]ulton Reek D  SONOGRAPHER  Sherrie Sport RDCS  PERFORMING   [facility], [employer]  cc:  ------------------------------------------------------------------- LV EF: 20% -   25%  ------------------------------------------------------------------- Indications:      CHF 428.0.  ------------------------------------------------------------------- History:   PMH:  Severe mitral regurgitation, sleep apnea, PAF GERD, s/p minimall;y invasive mitral valve repair.  Transient ischemic attack.  Stroke.  Chronic obstructive pulmonary disease.   ------------------------------------------------------------------- Study Conclusions  - Left ventricle: The cavity size was moderately dilated. There was   mild concentric hypertrophy.  Systolic function was severely   reduced. The estimated ejection fraction was in the range of 20%   to 25%. Diffuse hypokinesis. - Aorta: Aortic root dimension: 43 mm (ED). - Aortic root: The aortic root was mildly dilated. - Mitral valve: Prior procedures included surgical repair. The   findings are consistent with mild stenosis. There was mild   regurgitation. Mean gradient (D): 3 mm Hg. - Left atrium: The atrium was severely dilated. - Right atrium: The atrium was mildly dilated.  Impressions:  - significant worsening in LV systolic function since last echo in   2018 (EF was 40-45%).   Active Problem list this admission: 1. Acute on chronic combined CHF/NICM - felt to be tachy-mediated CM secondary to non-compliance.  2. Persistent / permanent afib/flutter with RVR.  It is documented patient  refuses anticoagulation and is aware of stroke risk.  Patient needs EP f/u.   3. Non-obstructive CAD / elevated troponin - denies chest pain - likely due to demand ischemia.   4.  Mitral regurgitation - s/p mitral valve in the past 5. Hyperglycemia  CHF Education:?? Educational session with patient, wife and daughter.  This RN received permission from the patient to speak in front of his wife and daughter. Patient and  wife stated the term "heart failure" is not a new term for the patient.     Provided patient with "Living Better with Heart Failure" packet. Briefly reviewed definition of heart failure and signs and symptoms of an exacerbation.?Discussed the meaning of the EF and his value compared to normal value.  Explained to patient that HF is a chronic illness which requires self-assessment / self-management along with help from the cardiologist/PCP.??   *Reviewed importance of and reason behind checking weight daily in the AM, after using the bathroom, but before getting dressed.  Patient has not been performing daily weights at home. ?Patient has purchase scales.??  Reviewed the  following information with patient:  *Discussed when to call the Dr= weight gain of >2-3lb overnight of 5lb in a week,  *Discussed yellow zone= call MD: weight gain of >2-3lb overnight of 5lb in a week, increased swelling, increased SOB when lying down, chest discomfort, dizziness, increased fatigue *Red Zone= call 911: struggle to breath, fainting or near fainting, significant chest pain   *Reviewed low sodium diet-provided handout of recommended and not recommended foods.??Reviewed reading labels with patient. Discussed fluid intake with patient as well. Patient not currently on a fluid restriction, but advised no more than 8-8 ounces glass of fluids per day.?Dietitian Consultation entered for diet education.  Patient and wife informed.    *Instructed patient to take medications as prescribed for heart failure. Explained briefly why pt is on the medications (either make you feel better, live longer or keep you out of the hospital) and discussed monitoring and side effects.   *Discussed exercise.?Did not discuss this today.  Deferred.  Will round on patient tomorrow.    *Smoking Cessation?- Patient is a current every day smoker.  Patient quit one other time for almost 1 year.  Patient informed this RN that he has been smoking since he was 77 years old.  Tips on Quitting - Yes You Can given to patient.    Patient currently requiring oxygen.  Patient was not on oxygen at home prior to admission.    At one point during this educational session patient's wife became tearful when patient started talking about how he feels about smoking and how the smoking may or may not be shortening his life expectancy by affecting his health.   ? Once again the 5 Steps to Living Better with Heart Failure were reviewed. ?Patient thanked me for providing the above information.   Will round on patient tomorrow.   ?  Roanna Epley, RN, BSN, [facility]? [facility]  Cardiovascular &?Pulmonary  Nurse Navigator  Direct Line: [phone]  Department Phone #: [phone] Fax: [phone]? Email Address: [email or web address]? ?

## 2018-05-01 NOTE — Care Management (Signed)
Placed in observation for heart failure.  Currently on oxygen which is acute. He is experiencing elevated heart rate.  Patient took himself off amiodarone. Receiving IV lasix. Cardiology has consulted

## 2018-05-01 NOTE — Progress Notes (Signed)
*  PRELIMINARY RESULTS* Echocardiogram 2D Echocardiogram has been performed.  Sherrie Sport 05/01/2018, 11:06 AM

## 2018-05-02 ENCOUNTER — Inpatient Hospital Stay (HOSPITAL_COMMUNITY)
Admission: AD | Admit: 2018-05-02 | Discharge: 2018-05-05 | DRG: 242 | Disposition: A | Discharge status: Home / Self Care | Payer: Medicare HMO | Source: Other Acute Inpatient Hospital | Attending: Internal Medicine | Admitting: Internal Medicine

## 2018-05-02 ENCOUNTER — Inpatient Hospital Stay (HOSPITAL_COMMUNITY)
Admission: RE | Admit: 2018-05-02 | Discharge: 2018-05-02 | Disposition: A | Discharge status: Still In Hospital | Payer: Medicare HMO | Source: Ambulatory Visit | Attending: Internal Medicine | Admitting: Internal Medicine

## 2018-05-02 DIAGNOSIS — F17210 Nicotine dependence, cigarettes, uncomplicated: Secondary | ICD-10-CM | POA: Diagnosis present

## 2018-05-02 DIAGNOSIS — I9589 Other hypotension: Secondary | ICD-10-CM | POA: Diagnosis not present

## 2018-05-02 DIAGNOSIS — Z79899 Other long term (current) drug therapy: Secondary | ICD-10-CM | POA: Diagnosis not present

## 2018-05-02 DIAGNOSIS — I110 Hypertensive heart disease with heart failure: Secondary | ICD-10-CM | POA: Diagnosis present

## 2018-05-02 DIAGNOSIS — I6930 Unspecified sequelae of cerebral infarction: Secondary | ICD-10-CM

## 2018-05-02 DIAGNOSIS — I480 Paroxysmal atrial fibrillation: Secondary | ICD-10-CM | POA: Diagnosis present

## 2018-05-02 DIAGNOSIS — I482 Chronic atrial fibrillation: Secondary | ICD-10-CM | POA: Diagnosis present

## 2018-05-02 DIAGNOSIS — Y92230 Patient room in hospital as the place of occurrence of the external cause: Secondary | ICD-10-CM | POA: Diagnosis not present

## 2018-05-02 DIAGNOSIS — Z8679 Personal history of other diseases of the circulatory system: Secondary | ICD-10-CM

## 2018-05-02 DIAGNOSIS — E039 Hypothyroidism, unspecified: Secondary | ICD-10-CM | POA: Diagnosis present

## 2018-05-02 DIAGNOSIS — I499 Cardiac arrhythmia, unspecified: Secondary | ICD-10-CM | POA: Diagnosis not present

## 2018-05-02 DIAGNOSIS — R000 Tachycardia, unspecified: Secondary | ICD-10-CM | POA: Diagnosis present

## 2018-05-02 DIAGNOSIS — Z823 Family history of stroke: Secondary | ICD-10-CM

## 2018-05-02 DIAGNOSIS — J449 Chronic obstructive pulmonary disease, unspecified: Secondary | ICD-10-CM | POA: Diagnosis present

## 2018-05-02 DIAGNOSIS — G4733 Obstructive sleep apnea (adult) (pediatric): Secondary | ICD-10-CM | POA: Diagnosis present

## 2018-05-02 DIAGNOSIS — Z885 Allergy status to narcotic agent status: Secondary | ICD-10-CM | POA: Diagnosis not present

## 2018-05-02 DIAGNOSIS — K219 Gastro-esophageal reflux disease without esophagitis: Secondary | ICD-10-CM | POA: Diagnosis present

## 2018-05-02 DIAGNOSIS — Z8673 Personal history of transient ischemic attack (TIA), and cerebral infarction without residual deficits: Secondary | ICD-10-CM

## 2018-05-02 DIAGNOSIS — Z9889 Other specified postprocedural states: Secondary | ICD-10-CM

## 2018-05-02 DIAGNOSIS — R443 Hallucinations, unspecified: Secondary | ICD-10-CM | POA: Diagnosis not present

## 2018-05-02 DIAGNOSIS — T447X5A Adverse effect of beta-adrenoreceptor antagonists, initial encounter: Secondary | ICD-10-CM | POA: Diagnosis not present

## 2018-05-02 DIAGNOSIS — M1990 Unspecified osteoarthritis, unspecified site: Secondary | ICD-10-CM | POA: Diagnosis present

## 2018-05-02 DIAGNOSIS — F322 Major depressive disorder, single episode, severe without psychotic features: Secondary | ICD-10-CM | POA: Diagnosis present

## 2018-05-02 DIAGNOSIS — Z9114 Patient's other noncompliance with medication regimen: Secondary | ICD-10-CM | POA: Diagnosis not present

## 2018-05-02 DIAGNOSIS — Z87442 Personal history of urinary calculi: Secondary | ICD-10-CM | POA: Diagnosis not present

## 2018-05-02 DIAGNOSIS — I4891 Unspecified atrial fibrillation: Secondary | ICD-10-CM | POA: Diagnosis present

## 2018-05-02 DIAGNOSIS — R0602 Shortness of breath: Secondary | ICD-10-CM | POA: Diagnosis present

## 2018-05-02 DIAGNOSIS — I5043 Acute on chronic combined systolic (congestive) and diastolic (congestive) heart failure: Secondary | ICD-10-CM | POA: Diagnosis present

## 2018-05-02 DIAGNOSIS — F17200 Nicotine dependence, unspecified, uncomplicated: Secondary | ICD-10-CM | POA: Diagnosis present

## 2018-05-02 DIAGNOSIS — I43 Cardiomyopathy in diseases classified elsewhere: Secondary | ICD-10-CM | POA: Diagnosis present

## 2018-05-02 DIAGNOSIS — Z8249 Family history of ischemic heart disease and other diseases of the circulatory system: Secondary | ICD-10-CM

## 2018-05-02 DIAGNOSIS — E785 Hyperlipidemia, unspecified: Secondary | ICD-10-CM | POA: Diagnosis present

## 2018-05-02 DIAGNOSIS — H3562 Retinal hemorrhage, left eye: Secondary | ICD-10-CM | POA: Diagnosis present

## 2018-05-02 DIAGNOSIS — Z959 Presence of cardiac and vascular implant and graft, unspecified: Secondary | ICD-10-CM

## 2018-05-02 DIAGNOSIS — Z952 Presence of prosthetic heart valve: Secondary | ICD-10-CM

## 2018-05-02 DIAGNOSIS — I272 Pulmonary hypertension, unspecified: Secondary | ICD-10-CM | POA: Diagnosis present

## 2018-05-02 DIAGNOSIS — I428 Other cardiomyopathies: Secondary | ICD-10-CM | POA: Diagnosis present

## 2018-05-02 DIAGNOSIS — I340 Nonrheumatic mitral (valve) insufficiency: Secondary | ICD-10-CM | POA: Diagnosis present

## 2018-05-02 DIAGNOSIS — Z888 Allergy status to other drugs, medicaments and biological substances status: Secondary | ICD-10-CM | POA: Diagnosis not present

## 2018-05-02 DIAGNOSIS — I2510 Atherosclerotic heart disease of native coronary artery without angina pectoris: Secondary | ICD-10-CM | POA: Diagnosis present

## 2018-05-02 DIAGNOSIS — I484 Atypical atrial flutter: Secondary | ICD-10-CM | POA: Diagnosis present

## 2018-05-02 DIAGNOSIS — M797 Fibromyalgia: Secondary | ICD-10-CM | POA: Diagnosis present

## 2018-05-02 DIAGNOSIS — E876 Hypokalemia: Secondary | ICD-10-CM | POA: Diagnosis present

## 2018-05-02 DIAGNOSIS — I5023 Acute on chronic systolic (congestive) heart failure: Secondary | ICD-10-CM | POA: Diagnosis not present

## 2018-05-02 DIAGNOSIS — Z86718 Personal history of other venous thrombosis and embolism: Secondary | ICD-10-CM

## 2018-05-02 DIAGNOSIS — F411 Generalized anxiety disorder: Secondary | ICD-10-CM | POA: Diagnosis present

## 2018-05-02 DIAGNOSIS — R0902 Hypoxemia: Secondary | ICD-10-CM | POA: Diagnosis present

## 2018-05-02 DIAGNOSIS — I5022 Chronic systolic (congestive) heart failure: Secondary | ICD-10-CM | POA: Diagnosis not present

## 2018-05-02 DIAGNOSIS — H5462 Unqualified visual loss, left eye, normal vision right eye: Secondary | ICD-10-CM | POA: Diagnosis present

## 2018-05-02 DIAGNOSIS — R739 Hyperglycemia, unspecified: Secondary | ICD-10-CM | POA: Diagnosis present

## 2018-05-02 DIAGNOSIS — I4821 Permanent atrial fibrillation: Secondary | ICD-10-CM | POA: Diagnosis present

## 2018-05-02 LAB — BASIC METABOLIC PANEL
Anion gap: 12 (ref 5–15)
Anion gap: 12 (ref 5–15)
BUN: 18 mg/dL (ref 6–20)
BUN: 19 mg/dL (ref 6–20)
CO2: 28 mmol/L (ref 22–32)
CO2: 27 mmol/L (ref 22–32)
Calcium: 9.8 mg/dL (ref 8.9–10.3)
Calcium: 9.9 mg/dL (ref 8.9–10.3)
Chloride: 101 mmol/L (ref 101–111)
Chloride: 103 mmol/L (ref 101–111)
Creatinine, Ser: 1.36 mg/dL — ABNORMAL HIGH (ref 0.61–1.24)
Creatinine, Ser: 1.41 mg/dL — ABNORMAL HIGH (ref 0.61–1.24)
GFR calc Af Amer: 58 mL/min — ABNORMAL LOW (ref >60)
GFR calc Af Amer: 55 mL/min — ABNORMAL LOW (ref >60)
GFR calc non Af Amer: 50 mL/min — ABNORMAL LOW (ref >60)
GFR calc non Af Amer: 48 mL/min — ABNORMAL LOW (ref >60)
Glucose, Bld: 117 mg/dL — ABNORMAL HIGH (ref 65–99)
Glucose, Bld: 97 mg/dL (ref 65–99)
Potassium: 3.9 mmol/L (ref 3.5–5.1)
Potassium: 3.3 mmol/L — ABNORMAL LOW (ref 3.5–5.1)
Sodium: 141 mmol/L (ref 135–145)
Sodium: 142 mmol/L (ref 135–145)

## 2018-05-02 LAB — CBC
HCT: 46.2 % (ref 40.0–52.0)
Hemoglobin: 15.7 g/dL (ref 13.0–18.0)
MCH: 31.2 pg (ref 26.0–34.0)
MCHC: 33.9 g/dL (ref 32.0–36.0)
MCV: 92 fL (ref 80.0–100.0)
Platelets: 159 10*3/uL (ref 150–440)
RBC: 5.02 MIL/uL (ref 4.40–5.90)
RDW: 14.6 % — ABNORMAL HIGH (ref 11.5–14.5)
WBC: 7.2 10*3/uL (ref 3.8–10.6)

## 2018-05-02 LAB — CBC WITH DIFFERENTIAL/PLATELET
Abs Immature Granulocytes: 0 10*3/uL (ref 0.0–0.1)
Basophils Absolute: 0.1 10*3/uL (ref 0.0–0.1)
Basophils Relative: 2 %
Eosinophils Absolute: 0.2 10*3/uL (ref 0.0–0.7)
Eosinophils Relative: 3 %
HCT: 47 % (ref 39.0–52.0)
Hemoglobin: 15.4 g/dL (ref 13.0–17.0)
Immature Granulocytes: 0 %
Lymphocytes Relative: 28 %
Lymphs Abs: 1.5 10*3/uL (ref 0.7–4.0)
MCH: 30.1 pg (ref 26.0–34.0)
MCHC: 32.8 g/dL (ref 30.0–36.0)
MCV: 92 fL (ref 78.0–100.0)
Monocytes Absolute: 0.5 10*3/uL (ref 0.1–1.0)
Monocytes Relative: 9 %
Neutro Abs: 3.2 10*3/uL (ref 1.7–7.7)
Neutrophils Relative %: 58 %
Platelets: 187 10*3/uL (ref 150–400)
RBC: 5.11 MIL/uL (ref 4.22–5.81)
RDW: 14.1 % (ref 11.5–15.5)
WBC: 5.5 10*3/uL (ref 4.0–10.5)

## 2018-05-02 LAB — TSH: TSH: 15.425 u[IU]/mL — ABNORMAL HIGH (ref 0.350–4.500)

## 2018-05-02 LAB — BRAIN NATRIURETIC PEPTIDE: B Natriuretic Peptide: 400.6 pg/mL — ABNORMAL HIGH (ref 0.0–100.0)

## 2018-05-02 LAB — MAGNESIUM: Magnesium: 2.2 mg/dL (ref 1.7–2.4)

## 2018-05-02 LAB — HEPARIN LEVEL (UNFRACTIONATED)
Heparin Unfractionated: 0.41 IU/mL (ref 0.30–0.70)
Heparin Unfractionated: 0.4 IU/mL (ref 0.30–0.70)

## 2018-05-02 MED ORDER — CHLORHEXIDINE GLUCONATE 4 % EX LIQD
60.0000 mL | Freq: Once | CUTANEOUS | Status: AC
  Administered 2018-05-03: 4 via TOPICAL
  Filled 2018-05-02: qty 60

## 2018-05-02 MED ORDER — ASPIRIN 81 MG PO TBEC: 81.0000 mg | DELAYED_RELEASE_TABLET | Freq: Every day | ORAL | 0 refills | Status: DC

## 2018-05-02 MED ORDER — FLUTICASONE FUROATE-VILANTEROL 100-25 MCG/INH IN AEPB
1.0000 | INHALATION_SPRAY | Freq: Every day | RESPIRATORY_TRACT | Status: DC
  Administered 2018-05-03: 1 via RESPIRATORY_TRACT
  Filled 2018-05-02: qty 28

## 2018-05-02 MED ORDER — ACETAMINOPHEN 325 MG PO TABS: 650.0000 mg | ORAL_TABLET | ORAL | Status: DC | PRN

## 2018-05-02 MED ORDER — POTASSIUM CHLORIDE CRYS ER 20 MEQ PO TBCR: 40.0000 meq | EXTENDED_RELEASE_TABLET | Freq: Three times a day (TID) | ORAL | Status: DC

## 2018-05-02 MED ORDER — POTASSIUM CHLORIDE CRYS ER 20 MEQ PO TBCR
40.0000 meq | EXTENDED_RELEASE_TABLET | Freq: Three times a day (TID) | ORAL | Status: DC
  Administered 2018-05-02 – 2018-05-05 (×6): 40 meq via ORAL
  Filled 2018-05-02 (×7): qty 2

## 2018-05-02 MED ORDER — HEPARIN (PORCINE) IN NACL 100-0.45 UNIT/ML-% IJ SOLN: 1200.0000 [IU]/h | INTRAMUSCULAR | 0 refills | Status: DC

## 2018-05-02 MED ORDER — HEPARIN (PORCINE) IN NACL 100-0.45 UNIT/ML-% IJ SOLN
1200.0000 [IU]/h | INTRAMUSCULAR | Status: DC
  Administered 2018-05-02: 1200 [IU]/h via INTRAVENOUS

## 2018-05-02 MED ORDER — FUROSEMIDE 10 MG/ML IJ SOLN
80.0000 mg | Freq: Two times a day (BID) | INTRAMUSCULAR | Status: DC
  Administered 2018-05-02: 80 mg via INTRAVENOUS
  Filled 2018-05-02: qty 8

## 2018-05-02 MED ORDER — PANTOPRAZOLE SODIUM 40 MG PO TBEC
40.0000 mg | DELAYED_RELEASE_TABLET | Freq: Every day | ORAL | Status: DC
  Administered 2018-05-05: 40 mg via ORAL
  Filled 2018-05-02: qty 1

## 2018-05-02 MED ORDER — ONDANSETRON HCL 4 MG/2ML IJ SOLN: 4.0000 mg | Freq: Four times a day (QID) | INTRAMUSCULAR | Status: DC | PRN

## 2018-05-02 MED ORDER — ATORVASTATIN CALCIUM 40 MG PO TABS
40.0000 mg | ORAL_TABLET | Freq: Every day | ORAL | Status: DC
  Administered 2018-05-02 – 2018-05-04 (×3): 40 mg via ORAL
  Filled 2018-05-02 (×4): qty 1

## 2018-05-02 MED ORDER — IPRATROPIUM-ALBUTEROL 0.5-2.5 (3) MG/3ML IN SOLN
3.0000 mL | Freq: Three times a day (TID) | RESPIRATORY_TRACT | Status: DC
  Administered 2018-05-02: 3 mL via RESPIRATORY_TRACT
  Filled 2018-05-02: qty 3

## 2018-05-02 MED ORDER — CEFAZOLIN SODIUM-DEXTROSE 2-4 GM/100ML-% IV SOLN: 2.0000 g | INTRAVENOUS | Status: DC

## 2018-05-02 MED ORDER — SODIUM CHLORIDE 0.9 % IV SOLN: 80.0000 mg | INTRAVENOUS | Status: DC

## 2018-05-02 MED ORDER — CARVEDILOL 25 MG PO TABS: 25.0000 mg | ORAL_TABLET | Freq: Two times a day (BID) | ORAL | 0 refills | Status: DC

## 2018-05-02 MED ORDER — DOCUSATE SODIUM 100 MG PO CAPS
100.0000 mg | ORAL_CAPSULE | Freq: Two times a day (BID) | ORAL | Status: DC
  Administered 2018-05-02 – 2018-05-05 (×3): 100 mg via ORAL
  Filled 2018-05-02 (×4): qty 1

## 2018-05-02 MED ORDER — SODIUM CHLORIDE 0.9 % IV SOLN: INTRAVENOUS | Status: DC

## 2018-05-02 MED ORDER — CARVEDILOL 25 MG PO TABS
25.0000 mg | ORAL_TABLET | Freq: Two times a day (BID) | ORAL | Status: DC
  Administered 2018-05-02: 25 mg via ORAL
  Filled 2018-05-02: qty 1

## 2018-05-02 MED ORDER — FLUTICASONE PROPIONATE 50 MCG/ACT NA SUSP
2.0000 | Freq: Every day | NASAL | Status: DC
  Administered 2018-05-04: 2 via NASAL
  Filled 2018-05-02 (×2): qty 16

## 2018-05-02 MED ORDER — ALPRAZOLAM 0.25 MG PO TABS
0.2500 mg | ORAL_TABLET | Freq: Two times a day (BID) | ORAL | Status: DC | PRN
  Administered 2018-05-02 – 2018-05-04 (×4): 0.25 mg via ORAL
  Filled 2018-05-02 (×5): qty 1

## 2018-05-02 MED ORDER — FUROSEMIDE 10 MG/ML IJ SOLN: 80.0000 mg | Freq: Two times a day (BID) | INTRAMUSCULAR | Status: DC

## 2018-05-02 MED ORDER — ATORVASTATIN CALCIUM 40 MG PO TABS: 40.0000 mg | ORAL_TABLET | Freq: Every day | ORAL | 0 refills | Status: DC

## 2018-05-02 MED ORDER — ASPIRIN EC 81 MG PO TBEC
81.0000 mg | DELAYED_RELEASE_TABLET | Freq: Every day | ORAL | Status: DC
  Administered 2018-05-03 – 2018-05-05 (×3): 81 mg via ORAL
  Filled 2018-05-02 (×3): qty 1

## 2018-05-02 MED ORDER — CARVEDILOL 25 MG PO TABS
25.0000 mg | ORAL_TABLET | Freq: Two times a day (BID) | ORAL | Status: DC
  Administered 2018-05-03: 25 mg via ORAL
  Filled 2018-05-02: qty 1

## 2018-05-02 MED ORDER — SODIUM CHLORIDE 0.9 % IV SOLN
INTRAVENOUS | Status: DC
  Administered 2018-05-03: 06:00:00 via INTRAVENOUS

## 2018-05-02 MED ORDER — PANTOPRAZOLE SODIUM 40 MG PO TBEC: 40.0000 mg | DELAYED_RELEASE_TABLET | Freq: Every day | ORAL | 0 refills | Status: DC

## 2018-05-02 NOTE — Progress Notes (Signed)
[place] for Heparin drip Indication: atrial fibrillation  Allergies  Allergen Reactions  . Codeine Nausea Only  . Digoxin And Related     SOB, bad dreams  . Macrodantin [Nitrofurantoin Macrocrystal] Rash  . Morphine And Related Rash    Patient Measurements:   Heparin Dosing Weight: 86.8 kg  Vital Signs: Temp: 98.4 F (36.9 C) (05/14 1953) Temp Source: Oral (05/14 1953) BP: 94/72 (05/14 1953) Pulse Rate: 115 (05/14 1953)  Labs: Recent Labs    04/30/18 1458 04/30/18 2109 05/01/18 0412 05/01/18 1023 05/01/18 1920 05/02/18 0506 05/02/18 1316  HGB 14.9  --   --  15.5  --  15.7  --   HCT 44.5  --   --  45.3  --  46.2  --   PLT 153  --   --  155  --  159  --   APTT  --   --   --   --  37*  --   --   LABPROT  --   --   --   --  13.3  --   --   INR  --   --   --   --  1.02  --   --   HEPARINUNFRC  --   --   --   --   --  0.41 0.40  CREATININE 1.05  --   --  1.10  --   --  1.36*  TROPONINI <0.03 0.03* 0.03* <0.03  --   --   --     Estimated Creatinine Clearance: 49.2 mL/min (A) (by C-G formula based on SCr of 1.36 mg/dL (H)).   Assessment: 77 yo M transferred from [place] on heparin for AFib. Noted heparin level has been therapeutic x2 on the same rate. CBC is wnl, no bleeding noted. Patient to undergo ablation and pacemaker placement tomorrow.  Goal of Therapy:  Heparin level 0.3-0.7 units/ml Monitor platelets by anticoagulation protocol: Yes   Plan:  Continue heparin at 1200 units/hr Daily heparin level and CBC Follow cards plans for long term anticoagulation  Shirin Echeverry D. Jacquiline Zurcher, PharmD, BCPS Clinical Pharmacist [phone] 05/02/2018 8:25 PM

## 2018-05-02 NOTE — Progress Notes (Signed)
Patient is stable,denies pain,awaiting bed call for transfer to [facility] in [place] for Ablation and pacemaker placement tomorrow.

## 2018-05-02 NOTE — Progress Notes (Signed)
ANTICOAGULATION CONSULT NOTE - Initial Consult  Pharmacy Consult for Heparin drip Indication: atrial fibrillation  Allergies  Allergen Reactions  . Codeine Nausea Only  . Digoxin And Related     SOB, bad dreams  . Macrodantin [Nitrofurantoin Macrocrystal] Rash  . Morphine And Related Rash    Patient Measurements: Height: 5\' 10"  (177.8 cm) Weight: 188 lb (85.3 kg) IBW/kg (Calculated) : 73 Heparin Dosing Weight: 86.8 kg  Vital Signs: Temp: 97.6 F (36.4 C) (05/14 0850) Temp Source: Oral (05/14 0850) BP: 103/84 (05/14 0850) Pulse Rate: 115 (05/14 0850)  Labs: Recent Labs    04/30/18 1458 04/30/18 2109 05/01/18 0412 05/01/18 1023 05/01/18 1920 05/02/18 0506 05/02/18 1316  HGB 14.9  --   --  15.5  --  15.7  --   HCT 44.5  --   --  45.3  --  46.2  --   PLT 153  --   --  155  --  159  --   APTT  --   --   --   --  37*  --   --   LABPROT  --   --   --   --  13.3  --   --   INR  --   --   --   --  1.02  --   --   HEPARINUNFRC  --   --   --   --   --  0.41 0.40  CREATININE 1.05  --   --  1.10  --   --  1.36*  TROPONINI <0.03 0.03* 0.03* <0.03  --   --   --     Estimated Creatinine Clearance: 49.2 mL/min (A) (by C-G formula based on SCr of 1.36 mg/dL (H)).   Medical History: Past Medical History:  Diagnosis Date  . Arthritis   . Asthma   . Bradycardia   . CAD in native artery    a. LHC 09/2015: 40% pCx, 35% mRCA.  [name] Chronic diastolic congestive heart failure ([place])   . COPD (chronic obstructive pulmonary disease) ([place])   . Depression   . Dilation of intestine 01/2015  . Fibromyalgia   . Frequent headaches   . GERD (gastroesophageal reflux disease)   . History of blood clots    eye   . History of hiatal hernia   . History of rheumatic fever   . Hypertension   . Kidney stone   . PAF (paroxysmal atrial fibrillation) ([place])    a. s/p TEE/DCCV 07/2015. b. H/o bleeding on Coumadin when INR >5, changed to Eliquis.  . S/P Minimally invasive maze operation for atrial  fibrillation 10/22/2015   Complete bilateral atrial lesion set using cryothermy and bipolar radiofrequency ablation with clipping of LA appendage via right mini thoracotomy approach  . S/P minimally invasive mitral valve repair 10/22/2015   Complex valvuloplasty including triangular resection of posterior leaflet, artificial Gore-tex neochord placement x6 and 38 mm Sorin Memo 3D Rechord ring annuloplasty via right minithoracotomy approach  . Severe mitral regurgitation    a. s/p MV repair 10/2015.  [name] Sleep apnea   . Stroke ([place])   . Thyroid disorder   . TIA (transient ischemic attack)   . Tobacco abuse     Medications:  Scheduled:  . aspirin EC  81 mg Oral Daily  . atorvastatin  40 mg Oral q1800  . carvedilol  25 mg Oral BID WC  . docusate sodium  100 mg Oral BID  . fluticasone  2 spray Each Nare Daily  . fluticasone furoate-vilanterol  1 puff Inhalation Daily  . furosemide  80 mg Intravenous BID  . ipratropium-albuterol  3 mL Nebulization TID  . pantoprazole  40 mg Oral Daily  . sodium chloride flush  3 mL Intravenous Q12H   Infusions:  . sodium chloride    . heparin 1,200 Units/hr (05/02/18 1232)    Assessment: 77 yo M to start Heparin Drip for Atrial Fibrillation. Patient has refused anticoagulation in the past and is mostly noncompliant except with furosemide.  Baseline : Hgb 15.5  Plt 155 INR 1.02  APTT 37   Goal of Therapy:  Heparin level 0.3-0.7 units/ml Monitor platelets by anticoagulation protocol: Yes   Plan:  Heparin level therapeutic at 0.40. CBC stable. Check heparin level/ CBC daily in AM. Pt to transfer to [facility].  Ramond Dial, Pharm.D, BCPS Clinical Pharmacist 05/02/2018,2:42 PM

## 2018-05-02 NOTE — Discharge Summary (Signed)
[facility] [place] NAME: Michael Delacruz    MR#:  [id number]  DATE OF BIRTH:  23-Dec-1940  DATE OF ADMISSION:  04/30/2018 ADMITTING PHYSICIAN: Idelle Crouch, MD  DATE OF DISCHARGE: 05/02/2018  PRIMARY CARE PHYSICIAN: [name], DO    ADMISSION DIAGNOSIS:  Acute respiratory failure with hypoxia (HCC) [J96.01] Acute on chronic congestive heart failure, unspecified heart failure type ([place]) [I50.9]  DISCHARGE DIAGNOSIS:  Principal Problem:   CHF (congestive heart failure) (HCC) Active Problems:   COPD (chronic obstructive pulmonary disease) (HCC)   CAD (coronary artery disease)   Tachycardia   A fib with RVR  SECONDARY DIAGNOSIS:   Past Medical History:  Diagnosis Date  . Arthritis   . Asthma   . Bradycardia   . CAD in native artery    a. LHC 09/2015: 40% pCx, 35% mRCA.  [name] Chronic diastolic congestive heart failure ([place])   . COPD (chronic obstructive pulmonary disease) ([place])   . Depression   . Dilation of intestine 01/2015  . Fibromyalgia   . Frequent headaches   . GERD (gastroesophageal reflux disease)   . History of blood clots    eye   . History of hiatal hernia   . History of rheumatic fever   . Hypertension   . Kidney stone   . PAF (paroxysmal atrial fibrillation) ([place])    a. s/p TEE/DCCV 07/2015. b. H/o bleeding on Coumadin when INR >5, changed to Eliquis.  . S/P Minimally invasive maze operation for atrial fibrillation 10/22/2015   Complete bilateral atrial lesion set using cryothermy and bipolar radiofrequency ablation with clipping of LA appendage via right mini thoracotomy approach  . S/P minimally invasive mitral valve repair 10/22/2015   Complex valvuloplasty including triangular resection of posterior leaflet, artificial Gore-tex neochord placement x6 and 38 mm Sorin Memo 3D Rechord ring annuloplasty via right minithoracotomy approach  . Severe mitral regurgitation    a. s/p MV repair  10/2015.  [name] Sleep apnea   . Stroke ([place])   . Thyroid disorder   . TIA (transient ischemic attack)   . Tobacco abuse     HOSPITAL COURSE:   * Ac on Ch combined CHF   IV laisx   Monitor I/o   Metorpolol,    Appreciated cardio consult.  * Persistent A fib   As per cardio- try to achieve rate control  Anticoagulation per Cardiology    seen by EP physician- suggested to have AV node ablation and pacemaker placement transferred to [facility] for that.   Patient had ratinal bleed after Coumadin use in the past, so he is refusing oral anticoagulation use at this time.  * Elevated Troponin    May be due to demand ischemia secondary to A fib and CHF    Cotn ASA, metoprolol  * Mitral valve repair   Monitor Echo  * Hyperlipidemia   Added atovastatin  * Htn    metoprolol  * hypokalemia    Replace oral.   Normal now.  DISCHARGE CONDITIONS:   Stable.  CONSULTS OBTAINED:  Treatment Team:  [name], MD  DRUG ALLERGIES:   Allergies  Allergen Reactions  . Codeine Nausea Only  . Digoxin And Related     SOB, bad dreams  . Macrodantin [Nitrofurantoin Macrocrystal] Rash  . Morphine And Related Rash    DISCHARGE MEDICATIONS:   Allergies as of 05/02/2018      Reactions   Codeine Nausea Only  Digoxin And Related    SOB, bad dreams   Macrodantin [nitrofurantoin Macrocrystal] Rash   Morphine And Related Rash      Medication List    TAKE these medications   albuterol 108 (90 Base) MCG/ACT inhaler Commonly known as:  PROVENTIL HFA;VENTOLIN HFA Inhale 2 puffs into the lungs every 6 (six) hours as needed for wheezing or shortness of breath.   ALPRAZolam 0.25 MG tablet Commonly known as:  XANAX Take 1 tablet (0.25 mg total) by mouth 2 (two) times daily as needed for anxiety or sleep.   aspirin 81 MG EC tablet Take 1 tablet (81 mg total) by mouth daily. Start taking on:  05/03/2018   atorvastatin 40 MG tablet Commonly known as:  LIPITOR Take  1 tablet (40 mg total) by mouth daily at 6 PM.   carvedilol 25 MG tablet Commonly known as:  COREG Take 1 tablet (25 mg total) by mouth 2 (two) times daily with a meal.   fluticasone furoate-vilanterol 100-25 MCG/INH Aepb Commonly known as:  BREO ELLIPTA Inhale 1 puff into the lungs daily.   furosemide 40 MG tablet Commonly known as:  LASIX Take 1 tablet (40 mg total) by mouth 2 (two) times daily as needed.   heparin 100-0.45 UNIT/ML-% infusion Inject 1,200 Units/hr into the vein continuous.   MULTI-VITAMINS Tabs Take 1 tablet by mouth daily.   pantoprazole 40 MG tablet Commonly known as:  PROTONIX Take 1 tablet (40 mg total) by mouth daily. Start taking on:  05/03/2018   polyethylene glycol powder powder Commonly known as:  GLYCOLAX/MIRALAX Take 17 g by mouth daily as needed for mild constipation or moderate constipation. Start 1 cap for few days, if need increase to 2 caps        DISCHARGE INSTRUCTIONS:    Transferred to [facility] for further procedure, follow up with cardiology as advised.  If you experience worsening of your admission symptoms, develop shortness of breath, life threatening emergency, suicidal or homicidal thoughts you must seek medical attention immediately by calling 911 or calling your MD immediately  if symptoms less severe.  You Must read complete instructions/literature along with all the possible adverse reactions/side effects for all the Medicines you take and that have been prescribed to you. Take any new Medicines after you have completely understood and accept all the possible adverse reactions/side effects.   Please note  You were cared for by a hospitalist during your hospital stay. If you have any questions about your discharge medications or the care you received while you were in the hospital after you are discharged, you can call the unit and asked to speak with the hospitalist on call if the hospitalist that took care of you  is not available. Once you are discharged, your primary care physician will handle any further medical issues. Please note that NO REFILLS for any discharge medications will be authorized once you are discharged, as it is imperative that you return to your primary care physician (or establish a relationship with a primary care physician if you do not have one) for your aftercare needs so that they can reassess your need for medications and monitor your lab values.    Today   CHIEF COMPLAINT:   Chief Complaint  Patient presents with  . Chest Pain    HISTORY OF PRESENT ILLNESS:  Michael Delacruz  is a 78 y.o. male with a known history of CAD, COPD, and HTN now to ER with 2-3 day hx of  worsening SOB. In ER, pt is hypoxic with CHF on CXR and elevation of BNP. Tachycardic. Denies CP or palpitations. No N/V/D. He is now admitted.   VITAL SIGNS:  Blood pressure 103/84, pulse (!) 115, temperature 97.6 F (36.4 C), temperature source Oral, resp. rate 18, height 5\' 10"  (1.778 m), weight 85.3 kg (188 lb), SpO2 (!) 89 %.  I/O:    Intake/Output Summary (Last 24 hours) at 05/02/2018 1331 Last data filed at 05/02/2018 1100 Gross per 24 hour  Intake 97.8 ml  Output 800 ml  Net -702.2 ml    PHYSICAL EXAMINATION:   GENERAL:  77 y.o.-year-old patient lying in the bed with no acute distress.  EYES: Pupils equal, round, reactive to light and accommodation. No scleral icterus. Extraocular muscles intact.  HEENT: Head atraumatic, normocephalic. Oropharynx and nasopharynx clear.  NECK:  Supple, no jugular venous distention. No thyroid enlargement, no tenderness.  LUNGS: Normal breath sounds bilaterally, no wheezing, b/l crepitation. No use of accessory muscles of respiration.  CARDIOVASCULAR: S1, S2 fast . No murmurs, rubs, or gallops.  ABDOMEN: Soft, nontender, nondistended. Bowel sounds present. No organomegaly or mass.  EXTREMITIES: No pedal edema, cyanosis, or clubbing.  NEUROLOGIC: Cranial nerves  II through XII are intact. Muscle strength 5/5 in all extremities. Sensation intact. Gait not checked.  PSYCHIATRIC: The patient is alert and oriented x 3.  SKIN: No obvious rash, lesion, or ulcer.    DATA REVIEW:   CBC Recent Labs  Lab 05/02/18 0506  WBC 7.2  HGB 15.7  HCT 46.2  PLT 159    Chemistries  Recent Labs  Lab 05/01/18 1023  NA 136  K 3.4*  CL 106  CO2 23  GLUCOSE 150*  BUN 12  CREATININE 1.10  CALCIUM 8.6*  AST 22  ALT 17  ALKPHOS 74  BILITOT 1.2    Cardiac Enzymes Recent Labs  Lab 05/01/18 1023  TROPONINI <0.03    Microbiology Results  Results for orders placed or performed during the hospital encounter of 10/20/15  Surgical pcr screen     Status: None   Collection Time: 10/20/15 12:58 PM  Result Value Ref Range Status   MRSA, PCR NEGATIVE NEGATIVE Final   Staphylococcus aureus NEGATIVE NEGATIVE Final    Comment:        The Xpert SA Assay (FDA approved for NASAL specimens in patients over 64 years of age), is one component of a comprehensive surveillance program.  Test performance has been validated by [facility] for patients greater than or equal to 44 year old. It is not intended to diagnose infection nor to guide or monitor treatment.     RADIOLOGY:  Dg Chest 2 View  Result Date: 04/30/2018 CLINICAL DATA:  Central chest pain and shortness of breath today, history stroke, TIA, atrial fibrillation, hypertension, COPD, asthma, smoker EXAM: CHEST - 2 VIEW COMPARISON:  01/16/2018 FINDINGS: Prior MVR and LEFT atrial appendage clipping. Enlargement of cardiac silhouette with mild pulmonary vascular congestion. Atherosclerotic calcification aorta. Bronchitic and emphysematous changes. Chronic interstitial prominence, little changed. No segmental consolidation, pleural effusion or pneumothorax. Bones diffusely demineralized. IMPRESSION: Enlargement of cardiac silhouette with pulmonary vascular congestion. COPD changes with chronic interstitial  prominence, could be related to mild chronic failure but no superimposed acute infiltrate is identified. Electronically Signed   By: Lavonia Dana M.D.   On: 04/30/2018 15:05    EKG:   Orders placed or performed during the hospital encounter of 04/30/18  . ED EKG  . ED EKG  .  EKG 12-Lead  . EKG 12-Lead  . EKG      Management plans discussed with the patient, family and they are in agreement.  CODE STATUS:     Code Status Orders  (From admission, onward)        Start     Ordered   04/30/18 2005  Full code  Continuous     04/30/18 2004    Code Status History    Date Active Date Inactive Code Status Order ID Comments User Context   12/06/2015 0237 12/06/2015 2033 Full Code [id number]  Harrie Foreman, MD Inpatient   10/22/2015 1753 10/29/2015 1911 Full Code [id number]  Rexene Alberts, MD Inpatient   08/15/2015 1616 08/18/2015 1903 Full Code [id number]  Bettey Costa, MD Inpatient      TOTAL TIME TAKING CARE OF THIS PATIENT: 35 minutes.    Vaughan Basta M.D on 05/02/2018 at 1:31 PM  Between 7am to 6pm - Pager - [phone]  After 6pm go to [email or web address] - password [email or web address] [facility]  Office  [phone]  CC: Primary care physician; [name], DO   Note: This dictation was prepared with Dragon dictation along with smaller phrase technology. Any transcriptional errors that result from this process are unintentional.

## 2018-05-02 NOTE — Plan of Care (Signed)
Nutrition Education Note  RD consulted for nutrition education regarding CHF.  77 y.o. male with a history of mitral valve repair for prolapsed leaflet, status post maze with left atrial appendage clipping and a history of incessant atrial tachycardia/flutter admitted with worsening heart failure, fatigue, dyspnea and found to have severe and intervally worsening LV function EF 45-50 (8/18)- now 20-25%.  RD provided "Low Sodium Nutrition Therapy" handout from the Academy of [employer] and [employer]etics. Reviewed patient's dietary recall. Provided examples on ways to decrease sodium intake in diet. Discouraged intake of processed foods and use of salt shaker. Encouraged fresh fruits and vegetables as well as whole grain sources of carbohydrates to maximize fiber intake.   RD discussed why it is important for patient to adhere to diet recommendations, and emphasized the role of fluids, foods to avoid, and importance of weighing self daily. Teach back method used.  Expect fair compliance.  Body mass index is 26.98 kg/m. Pt meets criteria for overweight based on current BMI.  Current diet order is 2 gram sodium restriction, patient is consuming approximately 100% of meals at this time. Labs and medications reviewed. No further nutrition interventions warranted at this time. RD contact information provided. If additional nutrition issues arise, please re-consult RD.   Koleen Distance MS, RD, LDN Pager #- [phone] Office#- [phone] After Hours Pager: [phone]

## 2018-05-02 NOTE — Progress Notes (Signed)
Patient discharged to [facility] for Ablation and pacemaker placement,vital signs within normal limits upon discharge,heparin drip infusing    Vital Sign Orders  (From admission, onward)        Start     Ordered   04/30/18 2005  Vital signs  Per unit routine     04/30/18 2004     signs within normal limits upon

## 2018-05-02 NOTE — H&P (Signed)
Cardiology Admission History and Physical:   Patient ID: Michael Delacruz; MRN: [id number]; DOB: [birth year]   Admission date: (Not on file)  Primary Care Provider: [name], DO Primary Cardiologist: Dr. [name] Primary Electrophysiologist:  Dr. Caryl Comes  Chief Complaint:  SOB  Patient Profile:   Michael Delacruz is a 77 y.o. male with a history of nonobstructive CAD by [facility] in 09/2015, chronic combined CHF, persistent/permanent Afib/flutter with RVR not on anticoagulation s/p TEE/DCCV in 2016 s/p MAZE procedure with clipping of the left atrial appendage in 10/2015 previously on Eliquis/warfarin, mitral valve regurgitation with prolapse of the posterior leaflet s/p mitral valve repair in 10/2015, pulmonary hypertension retinal bleed on warfarin, TIA, COPD secondary to tobacco abuse, HTN, noncompliance, fibromyalgia, and GERD who was admitted to [facility] 5/13 for acute on chronic combined CHF in the setting of tachy-mediated cardiomyopathy and atrial flutter with RVR.   History of Present Illness:   Michael Delacruz underwent TTE in 07/2015 that showed an EF of 55-60% with severe MR secondary to posterior leaflet prolapse. LHC in 09/2015 that showed severe MR with 30% proximal and mid LAD stenosis as well as 50% mid LCx stenosis, EF 50%. He underwent MAZE with mitral valve repair and left atrial appendage clipping in 10/2015. Follow up echo in 11/2015 showed an EF of 55-60%, mild aortic root dilatation with mild MR. Echo in 07/2017 showed an EF of 45-50%. Holter in 08/2017 showed a mean heart rate of 123 bpm with a range of 94-135 bpm. He has been followed by EP given his poorly controlled tachycardic rates and has declined AV junction ablation with pacing in the past. He has previously been on warfarin/Eliquis though has self discontinued both due to retinal bleed and has been adverse to [facility] since. Multiple efforts have been undertaken to control his rates medically due to continued tachycardia. He  was previously on amiodarone, though he self discontinued this medication due to nightmares. Most recent Holter monitor from 11/2017 showed a mean heart rate of 112 bpm with a range of 94-135 bpm, with 93% of all beats beaing > 100 bpm. At his last follow up in 01/2018, he was noted to only want to take Lasix and nothing else. He was noted to be in atrial flutter with RVR with a rate of 118 bpm. It was recommended he be treated for bronchitis at that time.   Has noted an intermittent worsening SOB over the past 2 months that became suddenly worse on 5/12 prompting him to come in to the ED. He has been noncompliant with his medications, taking Lasix 40 mg 3-4 times weekly, not taking any medications for rate control, refusing anticoagulation. Upon his admission to [facility], he was noted to be in Afib/flutter with RVR with heart rates into the 130s bpm. Troponin peaked at 0.03 x 2, BNP 527, K+ 3.5 s/p PO repletion (current K+ pending), WBC 5.0, HGB 14.9. He was placed on IV Lasix, metoprolol, ASA, and nebulizer. Upon cardiology seeing the patient, he was started on a heparin gtt, which he has tolerated without issues to date. He has been seen by EP with recommendations to transfer to [facility] for AV node ablation and pacing, to be completed on 5/15. Documented UOP of 1.4 L for the admission to date.    Past Medical History:  Diagnosis Date  . Arthritis   . Asthma   . Bradycardia   . CAD in native artery    a. LHC 09/2015: 40% pCx, 35% mRCA.  [name]  Chronic diastolic congestive heart failure ([place])   . COPD (chronic obstructive pulmonary disease) ([place])   . Depression   . Dilation of intestine 01/2015  . Fibromyalgia   . Frequent headaches   . GERD (gastroesophageal reflux disease)   . History of blood clots    eye   . History of hiatal hernia   . History of rheumatic fever   . Hypertension   . Kidney stone   . PAF (paroxysmal atrial fibrillation) ([place])    a. s/p TEE/DCCV 07/2015. b. H/o bleeding on  Coumadin when INR >5, changed to Eliquis.  . S/P Minimally invasive maze operation for atrial fibrillation 10/22/2015   Complete bilateral atrial lesion set using cryothermy and bipolar radiofrequency ablation with clipping of LA appendage via right mini thoracotomy approach  . S/P minimally invasive mitral valve repair 10/22/2015   Complex valvuloplasty including triangular resection of posterior leaflet, artificial Gore-tex neochord placement x6 and 38 mm Sorin Memo 3D Rechord ring annuloplasty via right minithoracotomy approach  . Severe mitral regurgitation    a. s/p MV repair 10/2015.  [name] Sleep apnea   . Stroke ([place])   . Thyroid disorder   . TIA (transient ischemic attack)   . Tobacco abuse     Past Surgical History:  Procedure Laterality Date  . ANKLE SURGERY    . CARDIAC CATHETERIZATION N/A 10/03/2015   Procedure: Right and Left Heart Cath and Coronary Angiography;  Surgeon: [name], MD;  Location: [place] CV LAB;  Service: Cardiovascular;  Laterality: N/A;  . COLONOSCOPY    . ELECTROPHYSIOLOGIC STUDY N/A 08/18/2015   Procedure: CARDIOVERSION;  Surgeon: Wellington Hampshire, MD;  Location: [facility] ORS;  Service: Cardiovascular;  Laterality: N/A;  . MINIMALLY INVASIVE MAZE PROCEDURE N/A 10/22/2015   Procedure: MINIMALLY INVASIVE MAZE PROCEDURE;  Surgeon: Rexene Alberts, MD;  Location: [place];  Service: Open Heart Surgery;  Laterality: N/A;  . MITRAL VALVE REPAIR Right 10/22/2015   Procedure: MINIMALLY INVASIVE MITRAL VALVE REPAIR (MVR);  Surgeon: Rexene Alberts, MD;  Location: [place];  Service: Open Heart Surgery;  Laterality: Right;  . SINUS EXPLORATION    . TEE WITHOUT CARDIOVERSION N/A 08/18/2015   Procedure: TRANSESOPHAGEAL ECHOCARDIOGRAM (TEE);  Surgeon: Wellington Hampshire, MD;  Location: [facility] ORS;  Service: Cardiovascular;  Laterality: N/A;  . TEE WITHOUT CARDIOVERSION N/A 10/22/2015   Procedure: TRANSESOPHAGEAL ECHOCARDIOGRAM (TEE);  Surgeon: Rexene Alberts, MD;  Location:  [place];  Service: Open Heart Surgery;  Laterality: N/A;     Medications Prior to Admission: Prior to Admission medications   Medication Sig Start Date End Date Taking? Authorizing Provider  albuterol (PROVENTIL HFA;VENTOLIN HFA) 108 (90 Base) MCG/ACT inhaler Inhale 2 puffs into the lungs every 6 (six) hours as needed for wheezing or shortness of breath. 01/24/18   [name], MD  ALPRAZolam Duanne Moron) 0.25 MG tablet Take 1 tablet (0.25 mg total) by mouth 2 (two) times daily as needed for anxiety or sleep. 04/25/18   Karamalegos, Alexander J, DO  fluticasone furoate-vilanterol (BREO ELLIPTA) 100-25 MCG/INH AEPB Inhale 1 puff into the lungs daily. 04/26/18   Karamalegos, Devonne Doughty, DO  furosemide (LASIX) 40 MG tablet Take 1 tablet (40 mg total) by mouth 2 (two) times daily as needed. 09/21/17   [name], MD  Multiple Vitamin (MULTI-VITAMINS) TABS Take 1 tablet by mouth daily.  04/28/11   [provider]  polyethylene glycol powder (GLYCOLAX/MIRALAX) powder Take 17 g by mouth daily as needed for  mild constipation or moderate constipation. Start 1 cap for few days, if need increase to 2 caps 04/25/18   Parks Ranger, Devonne Doughty, DO     Allergies:    Allergies  Allergen Reactions  . Codeine Nausea Only  . Digoxin And Related     SOB, bad dreams  . Macrodantin [Nitrofurantoin Macrocrystal] Rash  . Morphine And Related Rash    Social History:   Social History   Socioeconomic History  . Marital status: Married    Spouse name: Not on file  . Number of children: Not on file  . Years of education: Not on file  . Highest education level: Not on file  Occupational History  . Not on file  Social Needs  . Financial resource strain: Not hard at all  . Food insecurity:    Worry: Never true    Inability: Never true  . Transportation needs:    Medical: No    Non-medical: No  Tobacco Use  . Smoking status: Current Every Day Smoker    Packs/day: 0.50    Years: 52.00    Pack  years: 26.00    Types: Cigarettes    Last attempt to quit: 02/13/2018    Years since quitting: 0.2  . Smokeless tobacco: Never Used  . Tobacco comment: Resumed smoking, after quit 01/2018  Substance and Sexual Activity  . Alcohol use: No    Alcohol/week: 0.5 oz    Types: 1 Standard drinks or equivalent per week    Comment: rare  . Drug use: No  . Sexual activity: Not on file  Lifestyle  . Physical activity:    Days per week: 0 days    Minutes per session: 0 min  . Stress: Not at all  Relationships  . Social connections:    Talks on phone: More than three times a week    Gets together: Never    Attends religious service: Never    Active member of club or organization: No    Attends meetings of clubs or organizations: Never    Relationship status: Married  . Intimate partner violence:    Fear of current or ex partner: No    Emotionally abused: No    Physically abused: No    Forced sexual activity: No  Other Topics Concern  . Not on file  Social History Narrative  . Not on file    Family History:   The patient's family history includes Heart murmur in his brother; Hypertension in his other; Irregular heart beat in his mother; Pulmonary embolism in his brother; Stroke in his mother.    ROS:  Review of Systems  Constitutional: Positive for malaise/fatigue. Negative for chills, diaphoresis, fever and weight loss.  HENT: Negative for congestion.   Eyes: Negative for discharge and redness.  Respiratory: Positive for cough, sputum production and shortness of breath. Negative for hemoptysis and wheezing.   Cardiovascular: Positive for palpitations. Negative for chest pain, orthopnea, claudication, leg swelling and PND.  Gastrointestinal: Negative for abdominal pain, blood in stool, heartburn, melena, nausea and vomiting.  Genitourinary: Negative for hematuria.  Musculoskeletal: Negative for falls and myalgias.  Skin: Negative for rash.  Neurological: Positive for weakness.  Negative for dizziness, tingling, tremors, sensory change, speech change, focal weakness and loss of consciousness.  Endo/Heme/Allergies: Does not bruise/bleed easily.  Psychiatric/Behavioral: Negative for substance abuse. The patient is not nervous/anxious.   All other systems reviewed and are negative.   Physical Exam/Data:  Blood pressure 103/84, pulse (!) 115,  temperature 97.6 F (36.4 C), temperature source Oral, resp. rate 18, height 5\' 10"  (1.778 m), weight 188 lb (85.3 kg), SpO2 (!) 89 %. General:  Well nourished, well developed, in no acute distress HEENT: Normal Lymph: No adenopathy Neck: Mild JVD Endocrine:  No thryomegaly Vascular: No carotid bruits  Cardiac:  Normal S1, S2; Tachycardic, irregular; II/VI systolic murmur  Lungs:  Clear to auscultation bilaterally, no wheezing, rhonchi or rales  Abd: Soft, nontender, no hepatomegaly  Ext: No edema Musculoskeletal:  No deformities, BUE and BLE strength normal and equal Skin: Warm and dry  Neuro:  CNs 2-12 intact, no focal abnormalities noted Psych:  Normal affect    EKG:  The ECG that was done was personally reviewed and demonstrates Afib with RVR, 117 bpm, nonspecific lateral st/t changes  Relevant CV Studies: TTE 05/01/2018: Study Conclusions  - Left ventricle: The cavity size was moderately dilated. There was   mild concentric hypertrophy. Systolic function was severely   reduced. The estimated ejection fraction was in the range of 20%   to 25%. Diffuse hypokinesis. - Aorta: Aortic root dimension: 43 mm (ED). - Aortic root: The aortic root was mildly dilated. - Mitral valve: Prior procedures included surgical repair. The   findings are consistent with mild stenosis. There was mild   regurgitation. Mean gradient (D): 3 mm Hg. - Left atrium: The atrium was severely dilated. - Right atrium: The atrium was mildly dilated.  Impressions:  - significant worsening in LV systolic function since last echo in   2018  (EF was 40-45%).   24 hour Holter 11/2017: Indication AFib rate control  Duration 24h Dominant Rhythm Atrial F Mean HR 112 Range HR 94-135 Total beats 150 K Tachycardia none 93 % >100 bpm Pauses none  Symptoms noted none  Rate control inadequate   TTE 07/2017: Study Conclusions  - Left ventricle: The cavity size was normal. There was moderate concentric hypertrophy. Systolic function was mildly to moderately reduced. The estimated ejection fraction was in the range of 40% to 45%. Images were inadequate for LV wall motion assessment. The study is not technically sufficient to allow evaluation of LV diastolic function. - Aorta: Aortic root dimension: 40 mm (ED). - Aortic root: The aortic root was mildly dilated. - Mitral valve: Prior procedures included surgical repair. The findings are consistent with mild to moderate stenosis. There was mild regurgitation. Mean gradient (D): 6 mm Hg. Valve area by continuity equation (using LVOT flow): 1.18 cm^2. - Left atrium: The atrium was severely dilated. - Right atrium: The atrium was mildly dilated. - Pulmonary arteries: Systolic pressure could not be accurately estimated. - Inferior vena cava: The vessel was dilated. The respirophasic diameter changes were blunted (<50%), consistent with elevated central venous pressure.   Laboratory Data:  Chemistry Recent Labs  Lab 04/30/18 1458 05/01/18 1023  NA 137 136  K 3.5 3.4*  CL 105 106  CO2 25 23  GLUCOSE 109* 150*  BUN 9 12  CREATININE 1.05 1.10  CALCIUM 8.5* 8.6*  GFRNONAA >60 >60  GFRAA >60 >60  ANIONGAP 7 7    Recent Labs  Lab 05/01/18 1023  PROT 6.6  ALBUMIN 4.1  AST 22  ALT 17  ALKPHOS 74  BILITOT 1.2   Hematology Recent Labs  Lab 05/01/18 1023 05/02/18 0506  WBC 5.3 7.2  RBC 4.93 5.02  HGB 15.5 15.7  HCT 45.3 46.2  MCV 91.9 92.0  MCH 31.5 31.2  MCHC 34.2 33.9  RDW 14.8* 14.6*  PLT 155 159   Cardiac  Enzymes Recent Labs  Lab 04/30/18 2109 05/01/18 0412 05/01/18 1023  TROPONINI 0.03* 0.03* <0.03   No results for input(s): TROPIPOC in the last 168 hours.  BNP Recent Labs  Lab 04/30/18 1458  BNP 527.0*    DDimer No results for input(s): DDIMER in the last 168 hours.  Radiology/Studies:  No results found.  Assessment and Plan:   Acute on chronic combined CHF/NICM: -Likely secondary to tachy-mediated cardiomyopathy secondary to noncompliance  -Continue IV diuresis with KCL repletion  -TTE as above -Change metoprolol to Coreg given his cardiomyopathy -Escalate evidence-based heart failure therapy as his BP allows, currently his BP is soft which precludes the addition of ACEi/ARB/spiro/ARNI -CHF education -Daily weights -Strict Is and Os  2. Persistent/permanent Afib/flutter with RVR: -Ventricular rates remain poorly controlled -Has had difficulty in controlling tachycardic rates despite multiple efforts -Prior discussion regarding AV node ablation with PPM, however the patient was refusing that at that time -Has not been on anticoagulation as he refuses to take, he is aware of stroke risk -CHADS2VASc at least 6 (CHF, HTN, age x 1, TIA x 2, vascular disease) -Seen by EP 05/02/18 with recommendation to transfer to [facility] on 5/14 with plans for AV node ablation and pacing on 5/15 -He will need ICU observation following this procedure for at least 24 hours -His metoprolol has been changed to Coreg as above given his cardiomyopathy -Continue heparin gtt, seems to be tolerating this well  3. Nonobstructive CAD/elevated troponin: -Never with chest pain -Likely supply demand ischemia in the setting of the above -ASA -Coreg as above -LDL 157  4. Mitral regurgitation s/p mitral valve repair: -TTE as above  5. Hyperglycemia: -A1c 5.5  6. HLD: -Lipitor 40 mg  7. Hypokalemia: -Replete to goal > 4.0 -BMET currently pending  8. Noncompliance: -Compliance  advised   Severity of Illness: The appropriate patient status for this patient is INPATIENT. Inpatient status is judged to be reasonable and necessary in order to provide the required intensity of service to ensure the patient's safety. The patient's presenting symptoms, physical exam findings, and initial radiographic and laboratory data in the context of their chronic comorbidities is felt to place them at high risk for further clinical deterioration. Furthermore, it is not anticipated that the patient will be medically stable for discharge from the hospital within 2 midnights of admission. The following factors support the patient status of inpatient.   " The patient's presenting symptoms include as above. " The worrisome physical exam findings include as above. " The initial radiographic and laboratory data are worrisome because of as above. " The chronic co-morbidities include as above.   * I certify that at the point of admission it is my clinical judgment that the patient will require inpatient hospital care spanning beyond 2 midnights from the point of admission due to high intensity of service, high risk for further deterioration and high frequency of surveillance required.*    For questions or updates, please contact [place] Please consult [email or web address] for contact info under Cardiology/STEMI.    Signed, Christell Faith, PA-C  05/02/2018 11:30 AM

## 2018-05-02 NOTE — Consult Note (Signed)
ELECTROPHYSIOLOGY CONSULT NOTE  Patient ID: Michael Delacruz, MRN: [id number], DOB/AGE: 1941-05-04 77 y.o. Admit date: 04/30/2018 Date of Consult: 05/02/2018  Primary Physician: [name], DO Primary Cardiologist: SIAH STEELY is a 77 y.o. male who is being seen today for the evaluation of atrial flutter and CHF at the request of TG/MA.   Chief Complaint: CHF and SVT   HPI Michael Delacruz is a 77 y.o. male with a history of mitral valve repair for prolapsed leaflet, status post maze with left atrial appendage clipping and a history of incessant atrial tachycardia/flutter admitted with worsening heart failure, fatigue, dyspnea and found to have severe and intervally worsening LV function EF 45-50 (8/18)--- now 20-25%.  Multiple efforts have been undertaken to control his rates medically because of the incessant tachycardia.  We will have resorted to amiodarone as a to be indication which  he was unable to tolerate because of abdominal symptoms and sleep disturbance.  Efforts to diurese her hard to quantitate.  Apparently he is filled a few jugs but net negative yesterday was quite limited.  A little bit less short of breath.  He has refused anticoagulation in the past because of a history of retinal venous bleeding.  A decision was made yesterday to start heparin.  He is quite anxious this morning referring repeatedly to that he is "dying" and as I seek to clarify his understanding of his condition, it has taken some effort to make sure that he understands our thinking of heart failure consequential to cardiomyopathy consequential to rapid uncontrolled rate.      DATE TEST    8/16 Echo EF 55-60% Severe MR 2/2 post leaflet prolapse>>repaired 11/16  12/16 Echo EF 55-60% Mild Aortic root dilitation MR mild  8/18 Echo EF 45-50%   5/19 Echo  EF 20-25% MR mild        Past Medical History:  Diagnosis Date  . Arthritis   . Asthma   .  Bradycardia   . CAD in native artery    a. LHC 09/2015: 40% pCx, 35% mRCA.  [name] Chronic diastolic congestive heart failure ([place])   . COPD (chronic obstructive pulmonary disease) ([place])   . Depression   . Dilation of intestine 01/2015  . Fibromyalgia   . Frequent headaches   . GERD (gastroesophageal reflux disease)   . History of blood clots    eye   . History of hiatal hernia   . History of rheumatic fever   . Hypertension   . Kidney stone   . PAF (paroxysmal atrial fibrillation) ([place])    a. s/p TEE/DCCV 07/2015. b. H/o bleeding on Coumadin when INR >5, changed to Eliquis.  . S/P Minimally invasive maze operation for atrial fibrillation 10/22/2015   Complete bilateral atrial lesion set using cryothermy and bipolar radiofrequency ablation with clipping of LA appendage via right mini thoracotomy approach  . S/P minimally invasive mitral valve repair 10/22/2015   Complex valvuloplasty including triangular resection of posterior leaflet, artificial Gore-tex neochord placement x6 and 38 mm Sorin Memo 3D Rechord ring annuloplasty via right minithoracotomy approach  . Severe mitral regurgitation    a. s/p MV repair 10/2015.  [name] Sleep apnea   . Stroke ([place])   . Thyroid disorder   . TIA (transient ischemic attack)   . Tobacco abuse       Surgical History:  Past Surgical History:  Procedure Laterality Date  . ANKLE SURGERY    . CARDIAC CATHETERIZATION N/A  10/03/2015   Procedure: Right and Left Heart Cath and Coronary Angiography;  Surgeon: [name], MD;  Location: [facility];  Service: Cardiovascular;  Laterality: N/A;  . COLONOSCOPY    . ELECTROPHYSIOLOGIC STUDY N/A 08/18/2015   Procedure: CARDIOVERSION;  Surgeon: Wellington Hampshire, MD;  Location: [facility];  Service: Cardiovascular;  Laterality: N/A;  . MINIMALLY INVASIVE MAZE PROCEDURE N/A 10/22/2015   Procedure: MINIMALLY INVASIVE MAZE PROCEDURE;  Surgeon: Rexene Alberts, MD;  Location: [place];  Service: Open Heart Surgery;   Laterality: N/A;  . MITRAL VALVE REPAIR Right 10/22/2015   Procedure: MINIMALLY INVASIVE MITRAL VALVE REPAIR (MVR);  Surgeon: Rexene Alberts, MD;  Location: [place];  Service: Open Heart Surgery;  Laterality: Right;  . SINUS EXPLORATION    . TEE WITHOUT CARDIOVERSION N/A 08/18/2015   Procedure: TRANSESOPHAGEAL ECHOCARDIOGRAM (TEE);  Surgeon: Wellington Hampshire, MD;  Location: [facility];  Service: Cardiovascular;  Laterality: N/A;  . TEE WITHOUT CARDIOVERSION N/A 10/22/2015   Procedure: TRANSESOPHAGEAL ECHOCARDIOGRAM (TEE);  Surgeon: Rexene Alberts, MD;  Location: [place];  Service: Open Heart Surgery;  Laterality: N/A;     Home Meds: Prior to Admission medications   Medication Sig Start Date End Date Taking? Authorizing Provider  albuterol (PROVENTIL HFA;VENTOLIN HFA) 108 (90 Base) MCG/ACT inhaler Inhale 2 puffs into the lungs every 6 (six) hours as needed for wheezing or shortness of breath. 01/24/18  Yes Gollan, Kathlene November, MD  ALPRAZolam Duanne Moron) 0.25 MG tablet Take 1 tablet (0.25 mg total) by mouth 2 (two) times daily as needed for anxiety or sleep. 04/25/18  Yes Karamalegos, Alexander J, DO  fluticasone furoate-vilanterol (BREO ELLIPTA) 100-25 MCG/INH AEPB Inhale 1 puff into the lungs daily. 04/26/18  Yes Karamalegos, Devonne Doughty, DO  furosemide (LASIX) 40 MG tablet Take 1 tablet (40 mg total) by mouth 2 (two) times daily as needed. 09/21/17  Yes [name], MD  Multiple Vitamin (MULTI-VITAMINS) TABS Take 1 tablet by mouth daily.  04/28/11  Yes [provider]  polyethylene glycol powder (GLYCOLAX/MIRALAX) powder Take 17 g by mouth daily as needed for mild constipation or moderate constipation. Start 1 cap for few days, if need increase to 2 caps 04/25/18  Yes Karamalegos, Devonne Doughty, DO    Inpatient Medications:  . aspirin EC  81 mg Oral Daily  . atorvastatin  40 mg Oral q1800  . docusate sodium  100 mg Oral BID  . fluticasone  2 spray Each Nare Daily  . fluticasone furoate-vilanterol  1  puff Inhalation Daily  . furosemide  40 mg Intravenous BID  . ipratropium-albuterol  3 mL Nebulization QID  . metoprolol tartrate  50 mg Oral Q8H  . pantoprazole  40 mg Oral Daily  . potassium chloride  20 mEq Oral BID  . sodium chloride flush  3 mL Intravenous Q12H      Allergies:  Allergies  Allergen Reactions  . Codeine Nausea Only  . Digoxin And Related     SOB, bad dreams  . Macrodantin [Nitrofurantoin Macrocrystal] Rash  . Morphine And Related Rash    Social History   Socioeconomic History  . Marital status: Married    Spouse name: Not on file  . Number of children: Not on file  . Years of education: Not on file  . Highest education level: Not on file  Occupational History  . Not on file  Social Needs  . Financial resource strain: Not hard at all  . Food insecurity:  Worry: Never true    Inability: Never true  . Transportation needs:    Medical: No    Non-medical: No  Tobacco Use  . Smoking status: Current Every Day Smoker    Packs/day: 0.50    Years: 52.00    Pack years: 26.00    Types: Cigarettes    Last attempt to quit: 02/13/2018    Years since quitting: 0.2  . Smokeless tobacco: Never Used  . Tobacco comment: Resumed smoking, after quit 01/2018  Substance and Sexual Activity  . Alcohol use: No    Alcohol/week: 0.5 oz    Types: 1 Standard drinks or equivalent per week    Comment: rare  . Drug use: No  . Sexual activity: Not on file  Lifestyle  . Physical activity:    Days per week: 0 days    Minutes per session: 0 min  . Stress: Not at all  Relationships  . Social connections:    Talks on phone: More than three times a week    Gets together: Never    Attends religious service: Never    Active member of club or organization: No    Attends meetings of clubs or organizations: Never    Relationship status: Married  . Intimate partner violence:    Fear of current or ex partner: No    Emotionally abused: No    Physically abused: No    Forced  sexual activity: No  Other Topics Concern  . Not on file  Social History Narrative  . Not on file     Family History  Problem Relation Age of Onset  . Stroke Mother   . Irregular heart beat Mother   . Heart murmur Brother   . Pulmonary embolism Brother   . Hypertension Other      ROS:  Please see the history of present illness.     All other systems reviewed and negative.    Physical Exam: Blood pressure 103/84, pulse (!) 115, temperature 97.6 F (36.4 C), temperature source Oral, resp. rate 18, height 5\' 10"  (1.778 m), weight 188 lb (85.3 kg), SpO2 (!) 89 %. General: Well developed, well nourished male in no acute distress. Head: Normocephalic, atraumatic, sclera non-icteric, no xanthomas, nares are without discharge. EENT: normal Lymph Nodes:  none Back: without scoliosis/kyphosis , no CVA tendersness Neck: Negative for carotid bruits. JVD 7-8 cm positive HJR  lungs: Clear bilaterally to auscultation without wheezes, rales, or rhonchi. Breathing is unlabored. Heart: Rapid and regular rate and rhythm with a 2/6 murmur , rubs, or gallops appreciated. Abdomen: Soft, non-tender, non-distended with normoactive bowel sounds. No hepatomegaly. No rebound/guarding. No obvious abdominal masses. Msk:  Strength and tone appear normal for age. Extremities: No clubbing or cyanosis. No  edema.  Distal pedal pulses are 2+ and equal bilaterally. Skin: Warm and Dry Neuro: Alert and oriented X 3. CN III-XII intact Grossly normal sensory and motor function . Psych:  Responds to questions with some obfuscation.  I asked his wife for the visit were normal for him, she thinks in the context of his high anxiety and fearfulness regarding his current situation it is his way of dealing with it.       Intake/Output Summary (Last 24 hours) at 05/02/2018 0934 Last data filed at 05/02/2018 0400 Gross per 24 hour  Intake 390.8 ml  Output 1250 ml  Net -859.2 ml     Labs: Cardiac Enzymes Recent Labs     04/30/18 1458 04/30/18 2109 05/01/18  6834 05/01/18 1023  TROPONINI <0.03 0.03* 0.03* <0.03   CBC Lab Results  Component Value Date   WBC 7.2 05/02/2018   HGB 15.7 05/02/2018   HCT 46.2 05/02/2018   MCV 92.0 05/02/2018   PLT 159 05/02/2018   PROTIME: Recent Labs    05/01/18 1920  LABPROT 13.3  INR 1.02   Chemistry  Recent Labs  Lab 05/01/18 1023  NA 136  K 3.4*  CL 106  CO2 23  BUN 12  CREATININE 1.10  CALCIUM 8.6*  PROT 6.6  BILITOT 1.2  ALKPHOS 74  ALT 17  AST 22  GLUCOSE 150*   Lipids Lab Results  Component Value Date   CHOL 215 (H) 05/01/2018   HDL 42 05/01/2018   LDLCALC 157 (H) 05/01/2018   TRIG 79 05/01/2018   BNP No results found for: PROBNP Thyroid Function Tests: No results for input(s): TSH, T4TOTAL, T3FREE, THYROIDAB in the last 72 hours.  Invalid input(s): FREET3    Miscellaneous No results found for: DDIMER  Radiology/Studies:  Dg Chest 2 View  Result Date: 04/30/2018 CLINICAL DATA:  Central chest pain and shortness of breath today, history stroke, TIA, atrial fibrillation, hypertension, COPD, asthma, smoker EXAM: CHEST - 2 VIEW COMPARISON:  01/16/2018 FINDINGS: Prior MVR and LEFT atrial appendage clipping. Enlargement of cardiac silhouette with mild pulmonary vascular congestion. Atherosclerotic calcification aorta. Bronchitic and emphysematous changes. Chronic interstitial prominence, little changed. No segmental consolidation, pleural effusion or pneumothorax. Bones diffusely demineralized. IMPRESSION: Enlargement of cardiac silhouette with pulmonary vascular congestion. COPD changes with chronic interstitial prominence, could be related to mild chronic failure but no superimposed acute infiltrate is identified. Electronically Signed   By: Lavonia Dana M.D.   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   He is improved following diuresis, but with incessant flutter, CHF will decompensate again as we would expect LV function to continue to worsen.  We have reviewed physiology extensively and tried to address herein is repeat comment taht " I am going to die."  Our non pharmacological option is AV ablation and pacing which we have broached in the past.  He is now desparate and willing to proceed   Given the results from the [place] trial and the anticipation of LV function recovery, we have discussed the lack of anticipated benefits/need for ICD as opposed to resynchronization by way of pacing  He has been averse to anticoagulation because of concerns of retinal bleeding.  I have mentioned to him that they have started heparin.  I will defer long-term management of anticoagulation to his further input  The benefits and risks were reviewed including but not limited to death,  perforation, infection, lead dislodgement and device malfunction.  The patient understands agrees and is willing to proceed.  We have also discussed that he would be likely device dependent following ablation.  As a consequence he would be observed in the ICU for at least 24 hours post procedure  We will need to check a metabolic profile with ongoing diuresis and previously identified hypokalemia  We will switch  his beta-blocker from metoprolol--carvedilol given LV dysfunction  Virl Axe

## 2018-05-02 NOTE — Progress Notes (Signed)
ANTICOAGULATION CONSULT NOTE - Initial Consult  Pharmacy Consult for Heparin drip Indication: atrial fibrillation  Allergies  Allergen Reactions  . Codeine Nausea Only  . Digoxin And Related     SOB, bad dreams  . Macrodantin [Nitrofurantoin Macrocrystal] Rash  . Morphine And Related Rash    Patient Measurements: Height: 5\' 10"  (177.8 cm) Weight: 188 lb (85.3 kg) IBW/kg (Calculated) : 73 Heparin Dosing Weight: 86.8 kg  Vital Signs: Temp: 97.9 F (36.6 C) (05/14 0325) Temp Source: Oral (05/13 2017) BP: 100/77 (05/14 0325) Pulse Rate: 114 (05/14 0325)  Labs: Recent Labs    04/30/18 1458 04/30/18 2109 05/01/18 0412 05/01/18 1023 05/01/18 1920 05/02/18 0506  HGB 14.9  --   --  15.5  --  15.7  HCT 44.5  --   --  45.3  --  46.2  PLT 153  --   --  155  --  159  APTT  --   --   --   --  37*  --   LABPROT  --   --   --   --  13.3  --   INR  --   --   --   --  1.02  --   HEPARINUNFRC  --   --   --   --   --  0.41  CREATININE 1.05  --   --  1.10  --   --   TROPONINI <0.03 0.03* 0.03* <0.03  --   --     Estimated Creatinine Clearance: 60.8 mL/min (by C-G formula based on SCr of 1.1 mg/dL).   Medical History: Past Medical History:  Diagnosis Date  . Arthritis   . Asthma   . Bradycardia   . CAD in native artery    a. LHC 09/2015: 40% pCx, 35% mRCA.  [name] Chronic diastolic congestive heart failure ([place])   . COPD (chronic obstructive pulmonary disease) ([place])   . Depression   . Dilation of intestine 01/2015  . Fibromyalgia   . Frequent headaches   . GERD (gastroesophageal reflux disease)   . History of blood clots    eye   . History of hiatal hernia   . History of rheumatic fever   . Hypertension   . Kidney stone   . PAF (paroxysmal atrial fibrillation) ([place])    a. s/p TEE/DCCV 07/2015. b. H/o bleeding on Coumadin when INR >5, changed to Eliquis.  . S/P Minimally invasive maze operation for atrial fibrillation 10/22/2015   Complete bilateral atrial lesion set using  cryothermy and bipolar radiofrequency ablation with clipping of LA appendage via right mini thoracotomy approach  . S/P minimally invasive mitral valve repair 10/22/2015   Complex valvuloplasty including triangular resection of posterior leaflet, artificial Gore-tex neochord placement x6 and 38 mm Sorin Memo 3D Rechord ring annuloplasty via right minithoracotomy approach  . Severe mitral regurgitation    a. s/p MV repair 10/2015.  [name] Sleep apnea   . Stroke ([place])   . Thyroid disorder   . TIA (transient ischemic attack)   . Tobacco abuse     Medications:  Scheduled:  . aspirin EC  81 mg Oral Daily  . atorvastatin  40 mg Oral q1800  . docusate sodium  100 mg Oral BID  . fluticasone  2 spray Each Nare Daily  . fluticasone furoate-vilanterol  1 puff Inhalation Daily  . furosemide  40 mg Intravenous BID  . ipratropium-albuterol  3 mL Nebulization QID  . metoprolol tartrate  50  mg Oral Q8H  . pantoprazole  40 mg Oral Daily  . potassium chloride  20 mEq Oral BID  . sodium chloride flush  3 mL Intravenous Q12H   Infusions:  . sodium chloride    . heparin 1,200 Units/hr (05/01/18 1951)    Assessment: 77 yo M to start Heparin Drip for Atrial Fibrillation. Patient has refused anticoagulation in the past and is mostly noncompliant except with furosemide.  Baseline : Hgb 15.5  Plt 155 INR 1.02  APTT 37   Goal of Therapy:  Heparin level 0.3-0.7 units/ml Monitor platelets by anticoagulation protocol: Yes   Plan:  Heparin level therapeutic at 0.41. CBC stable. Check confirmation level in 8 hours  Jonanthony Nahar D Mallary Kreger, Pharm.D, BCPS Clinical Pharmacist 05/02/2018,7:26 AM

## 2018-05-03 ENCOUNTER — Encounter (HOSPITAL_COMMUNITY): Payer: Self-pay | Admitting: General Practice

## 2018-05-03 ENCOUNTER — Other Ambulatory Visit: Payer: Self-pay

## 2018-05-03 ENCOUNTER — Inpatient Hospital Stay (HOSPITAL_COMMUNITY)
Admission: AD | Disposition: A | Discharge status: Home / Self Care | Payer: Self-pay | Source: Other Acute Inpatient Hospital | Attending: Cardiology

## 2018-05-03 DIAGNOSIS — I499 Cardiac arrhythmia, unspecified: Secondary | ICD-10-CM

## 2018-05-03 DIAGNOSIS — I5022 Chronic systolic (congestive) heart failure: Secondary | ICD-10-CM

## 2018-05-03 HISTORY — PX: BIV PACEMAKER INSERTION CRT-P: EP1199

## 2018-05-03 LAB — BASIC METABOLIC PANEL
Anion gap: 11 (ref 5–15)
BUN: 20 mg/dL (ref 6–20)
CO2: 23 mmol/L (ref 22–32)
Calcium: 9.4 mg/dL (ref 8.9–10.3)
Chloride: 103 mmol/L (ref 101–111)
Creatinine, Ser: 1.61 mg/dL — ABNORMAL HIGH (ref 0.61–1.24)
GFR calc Af Amer: 47 mL/min — ABNORMAL LOW (ref >60)
GFR calc non Af Amer: 40 mL/min — ABNORMAL LOW (ref >60)
Glucose, Bld: 125 mg/dL — ABNORMAL HIGH (ref 65–99)
Potassium: 3.8 mmol/L (ref 3.5–5.1)
Sodium: 137 mmol/L (ref 135–145)

## 2018-05-03 LAB — CBC
HCT: 45.9 % (ref 39.0–52.0)
Hemoglobin: 15 g/dL (ref 13.0–17.0)
MCH: 30.4 pg (ref 26.0–34.0)
MCHC: 32.7 g/dL (ref 30.0–36.0)
MCV: 92.9 fL (ref 78.0–100.0)
Platelets: 168 10*3/uL (ref 150–400)
RBC: 4.94 MIL/uL (ref 4.22–5.81)
RDW: 14.1 % (ref 11.5–15.5)
WBC: 5 10*3/uL (ref 4.0–10.5)

## 2018-05-03 LAB — SURGICAL PCR SCREEN
MRSA, PCR: NEGATIVE
Staphylococcus aureus: NEGATIVE

## 2018-05-03 LAB — HEPARIN LEVEL (UNFRACTIONATED): Heparin Unfractionated: <0.1 IU/mL — ABNORMAL LOW (ref 0.30–0.70)

## 2018-05-03 LAB — PROTIME-INR
INR: 1.1
Prothrombin Time: 14.1 seconds (ref 11.4–15.2)

## 2018-05-03 SURGERY — BIV PACEMAKER INSERTION CRT-P

## 2018-05-03 MED ORDER — HEPARIN (PORCINE) IN NACL 1000-0.9 UT/500ML-% IV SOLN
INTRAVENOUS | Status: AC
  Filled 2018-05-03: qty 500

## 2018-05-03 MED ORDER — CARVEDILOL 6.25 MG PO TABS
6.2500 mg | ORAL_TABLET | Freq: Two times a day (BID) | ORAL | Status: DC
  Administered 2018-05-03 – 2018-05-05 (×3): 6.25 mg via ORAL
  Filled 2018-05-03 (×4): qty 1

## 2018-05-03 MED ORDER — ALPRAZOLAM 0.25 MG PO TABS: 0.2500 mg | ORAL_TABLET | Freq: Two times a day (BID) | ORAL | Status: DC | PRN

## 2018-05-03 MED ORDER — IOPAMIDOL (ISOVUE-370) INJECTION 76%
INTRAVENOUS | Status: DC | PRN
  Administered 2018-05-03: 20 mL via INTRAVENOUS

## 2018-05-03 MED ORDER — MIDAZOLAM HCL 5 MG/5ML IJ SOLN
INTRAMUSCULAR | Status: AC
  Filled 2018-05-03: qty 5

## 2018-05-03 MED ORDER — HEPARIN (PORCINE) IN NACL 100-0.45 UNIT/ML-% IJ SOLN: 1200.0000 [IU]/h | INTRAMUSCULAR | Status: DC

## 2018-05-03 MED ORDER — ASPIRIN 81 MG PO TBEC: 81.0000 mg | DELAYED_RELEASE_TABLET | Freq: Every day | ORAL | Status: DC

## 2018-05-03 MED ORDER — SODIUM CHLORIDE 0.9 % IV SOLN: INTRAVENOUS | Status: AC

## 2018-05-03 MED ORDER — LIDOCAINE HCL (PF) 1 % IJ SOLN
INTRAMUSCULAR | Status: AC
  Filled 2018-05-03: qty 60

## 2018-05-03 MED ORDER — CEFAZOLIN SODIUM-DEXTROSE 1-4 GM/50ML-% IV SOLN
1.0000 g | Freq: Four times a day (QID) | INTRAVENOUS | Status: AC
  Administered 2018-05-03 – 2018-05-04 (×3): 1 g via INTRAVENOUS
  Filled 2018-05-03 (×3): qty 50

## 2018-05-03 MED ORDER — HEPARIN (PORCINE) IN NACL 2-0.9 UNITS/ML
INTRAMUSCULAR | Status: AC | PRN
  Administered 2018-05-03: 500 mL

## 2018-05-03 MED ORDER — ACETAMINOPHEN 325 MG PO TABS: 325.0000 mg | ORAL_TABLET | ORAL | Status: DC | PRN

## 2018-05-03 MED ORDER — ADULT MULTIVITAMIN W/MINERALS CH
1.0000 | ORAL_TABLET | Freq: Every day | ORAL | Status: DC
  Filled 2018-05-03 (×2): qty 1

## 2018-05-03 MED ORDER — FENTANYL CITRATE (PF) 100 MCG/2ML IJ SOLN
INTRAMUSCULAR | Status: AC
  Filled 2018-05-03: qty 2

## 2018-05-03 MED ORDER — SALINE SPRAY 0.65 % NA SOLN
1.0000 | NASAL | Status: DC | PRN
  Filled 2018-05-03: qty 44

## 2018-05-03 MED ORDER — CEFAZOLIN SODIUM-DEXTROSE 2-3 GM-%(50ML) IV SOLR
INTRAVENOUS | Status: AC | PRN
  Administered 2018-05-03: 2 g via INTRAVENOUS

## 2018-05-03 MED ORDER — ONDANSETRON HCL 4 MG/2ML IJ SOLN: 4.0000 mg | Freq: Four times a day (QID) | INTRAMUSCULAR | Status: DC | PRN

## 2018-05-03 MED ORDER — PANTOPRAZOLE SODIUM 40 MG PO TBEC: 40.0000 mg | DELAYED_RELEASE_TABLET | Freq: Every day | ORAL | Status: DC

## 2018-05-03 MED ORDER — IOPAMIDOL (ISOVUE-370) INJECTION 76%
INTRAVENOUS | Status: AC
  Filled 2018-05-03: qty 50

## 2018-05-03 MED ORDER — ALBUTEROL SULFATE (2.5 MG/3ML) 0.083% IN NEBU: 2.5000 mg | INHALATION_SOLUTION | Freq: Four times a day (QID) | RESPIRATORY_TRACT | Status: DC | PRN

## 2018-05-03 MED ORDER — FUROSEMIDE 40 MG PO TABS
40.0000 mg | ORAL_TABLET | Freq: Every day | ORAL | Status: DC
  Administered 2018-05-04 – 2018-05-05 (×2): 40 mg via ORAL
  Filled 2018-05-03 (×2): qty 1

## 2018-05-03 MED ORDER — MIDAZOLAM HCL 5 MG/5ML IJ SOLN
INTRAMUSCULAR | Status: DC | PRN
  Administered 2018-05-03: 2 mg via INTRAVENOUS

## 2018-05-03 MED ORDER — FENTANYL CITRATE (PF) 100 MCG/2ML IJ SOLN
INTRAMUSCULAR | Status: DC | PRN
  Administered 2018-05-03: 50 ug via INTRAVENOUS

## 2018-05-03 MED ORDER — FLUTICASONE FUROATE-VILANTEROL 100-25 MCG/INH IN AEPB: 1.0000 | INHALATION_SPRAY | Freq: Every day | RESPIRATORY_TRACT | Status: DC

## 2018-05-03 MED ORDER — POLYETHYLENE GLYCOL 3350 17 GM/SCOOP PO POWD
17.0000 g | Freq: Every day | ORAL | Status: DC | PRN
  Filled 2018-05-03: qty 255

## 2018-05-03 MED ORDER — SODIUM CHLORIDE 0.9 % IV SOLN
INTRAVENOUS | Status: AC
  Filled 2018-05-03: qty 2

## 2018-05-03 MED ORDER — LIDOCAINE HCL (PF) 1 % IJ SOLN
INTRAMUSCULAR | Status: DC | PRN
  Administered 2018-05-03: 30 mL

## 2018-05-03 MED ORDER — CEFAZOLIN SODIUM-DEXTROSE 2-4 GM/100ML-% IV SOLN
INTRAVENOUS | Status: AC
  Filled 2018-05-03: qty 100

## 2018-05-03 MED ORDER — SODIUM CHLORIDE 0.9 % IV SOLN
INTRAVENOUS | Status: DC
  Administered 2018-05-03 – 2018-05-04 (×2): via INTRAVENOUS

## 2018-05-03 MED ORDER — CARVEDILOL 25 MG PO TABS: 25.0000 mg | ORAL_TABLET | Freq: Two times a day (BID) | ORAL | Status: DC

## 2018-05-03 MED ORDER — ATORVASTATIN CALCIUM 40 MG PO TABS: 40.0000 mg | ORAL_TABLET | Freq: Every day | ORAL | Status: DC

## 2018-05-03 SURGICAL SUPPLY — 17 items
BALLN COR SINUS VENO 6FR 80 (BALLOONS) ×2
BALLOON COR SINUS VENO 6FR 80 (BALLOONS) ×1 IMPLANT
CABLE SURGICAL S-101-97-12 (CABLE) ×2 IMPLANT
CATH CPS DIRECT 135 DS2C020 (CATHETERS) ×2 IMPLANT
HEMOSTAT SURGICEL 2X4 FIBR (HEMOSTASIS) ×2 IMPLANT
KIT ESSENTIALS PG (KITS) ×2 IMPLANT
LEAD CAPSURE NOVUS 5076-58CM (Lead) ×2 IMPLANT
LEAD QUARTET 1458Q-86CM (Lead) ×2 IMPLANT
PACEMAKER QUDR ALLR CRT PM3562 (Pacemaker) ×1 IMPLANT
PAD DEFIB LIFELINK (PAD) ×2 IMPLANT
PMKR QUADRA ALLURE CRT PM3562 (Pacemaker) ×2 IMPLANT
SHEATH CLASSIC 7F (SHEATH) ×2 IMPLANT
SHEATH CLASSIC 9.5F (SHEATH) ×2 IMPLANT
SLITTER UNIVERSAL DS2A003 (MISCELLANEOUS) ×2 IMPLANT
TRAY PACEMAKER INSERTION (PACKS) ×2 IMPLANT
WIRE ACUITY WHISPER EDS 4648 (WIRE) ×2 IMPLANT
WIRE HI TORQ VERSACORE-J 145CM (WIRE) ×2 IMPLANT

## 2018-05-03 NOTE — Progress Notes (Addendum)
Electrophysiology Rounding Note  Patient Name: Michael Delacruz Date of Encounter: 05/03/2018  Primary Cardiologist: [name] Electrophysiologist: Caryl Comes   Subjective   The patient is doing well today.  At this time, the patient denies chest pain, shortness of breath, or any new concerns.  Inpatient Medications    Scheduled Meds: . aspirin EC  81 mg Oral Daily  . atorvastatin  40 mg Oral q1800  . carvedilol  25 mg Oral BID WC  . docusate sodium  100 mg Oral BID  . fluticasone  2 spray Each Nare Daily  . fluticasone furoate-vilanterol  1 puff Inhalation Daily  . furosemide  40 mg Oral Daily  . gentamicin irrigation  80 mg Irrigation On Call  . pantoprazole  40 mg Oral Daily  . potassium chloride  40 mEq Oral TID   Continuous Infusions: . sodium chloride 50 mL/hr at 05/03/18 0551  . sodium chloride    .  ceFAZolin (ANCEF) IV     PRN Meds: acetaminophen, ALPRAZolam, ondansetron (ZOFRAN) IV   Vital Signs    Vitals:   05/03/18 0100 05/03/18 0358 05/03/18 0605 05/03/18 0802  BP:  94/75    Pulse:  (!) 111 (!) 114   Resp:  12    Temp:  97.8 F (36.6 C)    TempSrc:  Oral    SpO2:  91%  92%  Weight: 188 lb 6.4 oz (85.5 kg)     Height: 5\' 10"  (1.778 m)       Intake/Output Summary (Last 24 hours) at 05/03/2018 0812 Last data filed at 05/03/2018 0606 Gross per 24 hour  Intake 327.7 ml  Output 650 ml  Net -322.3 ml   Filed Weights   05/03/18 0100  Weight: 188 lb 6.4 oz (85.5 kg)    Physical Exam    GEN- The patient is well appearing, alert and oriented x 3 today.   Head- normocephalic, atraumatic Eyes-  Sclera clear, conjunctiva pink Ears- hearing intact Oropharynx- clear Neck- supple Lungs- Clear to ausculation bilaterally, normal work of breathing Heart- Tachycardic regular rate and rhythm  GI- soft, NT, ND, + BS Extremities- no clubbing, cyanosis, or edema Skin- no rash or lesion Psych- euthymic mood, full affect Neuro- strength and sensation are  intact  Labs    CBC Recent Labs    04/30/18 1458  05/02/18 2031 05/03/18 0502  WBC 5.0   < > 5.5 5.0  NEUTROABS 3.6  --  3.2  --   HGB 14.9   < > 15.4 15.0  HCT 44.5   < > 47.0 45.9  MCV 92.8   < > 92.0 92.9  PLT 153   < > 187 168   < > = values in this interval not displayed.   Basic Metabolic Panel Recent Labs    05/02/18 2031 05/03/18 0502  NA 142 137  K 3.3* 3.8  CL 103 103  CO2 27 23  GLUCOSE 97 125*  BUN 19 20  CREATININE 1.41* 1.61*  CALCIUM 9.9 9.4  MG 2.2  --    Liver Function Tests Recent Labs    05/01/18 1023  AST 22  ALT 17  ALKPHOS 74  BILITOT 1.2  PROT 6.6  ALBUMIN 4.1   No results for input(s): LIPASE, AMYLASE in the last 72 hours. Cardiac Enzymes Recent Labs    04/30/18 2109 05/01/18 0412 05/01/18 1023  TROPONINI 0.03* 0.03* <0.03   BNP Invalid input(s): POCBNP D-Dimer No results for input(s): DDIMER in the last  72 hours. Hemoglobin A1C Recent Labs    05/01/18 1023  HGBA1C 5.5   Fasting Lipid Panel Recent Labs    05/01/18 1023  CHOL 215*  HDL 42  LDLCALC 157*  TRIG 79  CHOLHDL 5.1   Thyroid Function Tests Recent Labs    05/02/18 2031  TSH 15.425*    Telemetry    Atrial flutter, V rates around 110 (personally reviewed)  Radiology    No results found.   Patient Profile     Michael Delacruz is a 77 y.o. male admitted for PPM and AVN ablation  Assessment & Plan    1.  Atrial flutter with RVR Plan for CRTP and AVN ablation today. Procedure reviewed with patient again this morning who wishes to proceed. RN aware of need for right arm IV with fluids running prior to procedure Heparin stopped at 3AM this morning, will need to decide about long term anticoagulation (s/p AtriClip).  CHADS2VASC is at least 5  2.  Presumed tachycardia mediated cardiomyopathy CRTP and AVN ablation as above Update echo 3 months post CRT  3.  Acute on chronic systolic heart failure Euvolemic on exam Creatinine bumped today IV  Lasix stopped - will change to po Lasix today BMET in am  Dr Caryl Comes to see later this morning   Signed, Chanetta Marshall, NP  05/03/2018, 8:12 AM

## 2018-05-03 NOTE — Progress Notes (Signed)
Patient states that he is upset with the hospital and the services given because his dinner did not come up when he asked (family asked around 7p- dinner wasn't sent till 9pm). He also stated that pharmacy took too long to send up his saline nasal spray, which could have caused him to suffocate. Lastly, patient states that no one has been checking on him- this RN and the tech have been in the room every hour thus far to give meds, bring drinks, assist with RR, bring snacks, ect. Family was by bedside when patient stated his complaint. Agricultural consultant and other RN's that assisted in helping patient are aware. Will continue to monitor

## 2018-05-03 NOTE — Progress Notes (Signed)
Cardiology MD callled to stop heparin drip at 3am 05/02/2018, stating  procedure will be done 9:30 -10:30 am.

## 2018-05-03 NOTE — Progress Notes (Signed)
Pt asked for nasal spray- MD paged.

## 2018-05-03 NOTE — Progress Notes (Signed)
Assumed care for patient at 12am. Received report from previous RN Ronnie  to stop heparin @3am . Verbal order clarified with cardiologist Dr. Lexine Baton cardiologist oncall.  Patient aware of plan and agreeable to it. No other complaints at this time. Will continue to monitor patient.

## 2018-05-03 NOTE — Op Note (Signed)
NAME: Michael Delacruz, Michael Delacruz MEDICAL RECORD AY:[id number] ACCOUNT [id number] DATE OF BIRTH:1941-11-11 FACILITY: [facility] LOCATION: [place], [place]  OPERATIVE REPORT  DATE OF PROCEDURE:  05/03/2018  PREOPERATIVE DIAGNOSIS:  Uncontrolled atrial arrhythmia with cardiomyopathy and congestive heart failure and anticipated arteriovenous ablation.  POSTOPERATIVE DIAGNOSIS:  Uncontrolled atrial arrhythmia with cardiomyopathy and congestive heart failure and anticipated arteriovenous ablation.  PROCEDURE PERFORMED:  Single dual chamber pacemaker implantation with left ventricular lead placement.  DESCRIPTION OF PROCEDURE:  Following obtaining informed consent, the patient was brought to the Electrophysiology Laboratory and placed on the fluoroscopic table in supine position.  After routine prep and drape of the left upper chest, lidocaine was  infiltrated in the prepectoral subclavicular region.  An incision was made and carried down all the way to the prepectoral fascia.  Utilizing electrocautery and sharp dissection, a pocket was formed.  Similarly, hemostasis was obtained.  Thereafter, attention was turned to gaining access to the extrathoracic left subclavian vein, which was accomplished without difficulty without the aspiration of air or puncture in the artery.  Two separate venous punctures were accomplished.  Guidewires  were placed and retained, and sequentially, a 9 and 9.5-French sheath were placed, which through passed a Medtronic 5076, 58 cm active fixation ventricular lead, serial number [id number] and a St. Jude 135 CS cannulation catheter.  The RV lead was manipulated to the apex where the bipolar R-wave was 10 with a pacing impedance of 969, a threshold 1.1 V at 0.5 msec. Current threshold was 1.0 _____.  There was no diaphragmatic pacing at 10 V.  The current injury was moderate.  The  lead was secured to the prepectoral fascia.  The CS was cannulated without  difficulty.  Contrast occlusive venogram demonstrated a mid-lateral branch with a posterior course.  This was targeted and a wire was deployed and a St. Jude 1458 lead, serial number [id number], was deployed to the  junction between the mid and distal third.  In the 1-2 configuration, the bipolar L-wave was 13 with a pacing impedance of 996, a threshold of 0.6 V at 0.5 msec.  There was no diaphragmatic pacing at 10 V.  The deployment system was removed.  The lead  was secured to the prepectoral fascia.  The leads were attached to a [place] generator with the atrial port plugged.  The serial number was [id number].  Through the device, bipolar R-wave was greater than 12 with a pacing impedance of 840,  threshold 1.2 V at 0.5 msec.  The LV impedance in a 1-2 configuration was 980, threshold 0.7 V at 0.5 msec.  The pocket was copiously irrigated with antibiotic containing saline solution.  Hemostasis was assured.  The leads and the pulse generator were  placed in the pocket and secured to the prepectoral fascia.  Surgicel was placed in the pocket.  The wound was then closed in 2 layers in the normal fashion.  The wound was washed, dried, and a Dermabond dressing was applied.  Needle counts, sponge  counts and instrument counts were correct at the end of the procedure according to the staff.  The patient tolerated the procedure without apparent complication.  GN/NUANCE  D:05/03/2018 T:05/03/2018 JOB:[id number]

## 2018-05-04 ENCOUNTER — Inpatient Hospital Stay (HOSPITAL_COMMUNITY): Payer: Medicare HMO

## 2018-05-04 ENCOUNTER — Inpatient Hospital Stay (HOSPITAL_COMMUNITY)
Admission: AD | Disposition: A | Discharge status: Home / Self Care | Payer: Self-pay | Source: Other Acute Inpatient Hospital | Attending: Cardiology

## 2018-05-04 ENCOUNTER — Encounter (HOSPITAL_COMMUNITY): Payer: Self-pay | Admitting: *Deleted

## 2018-05-04 DIAGNOSIS — H3562 Retinal hemorrhage, left eye: Secondary | ICD-10-CM | POA: Diagnosis present

## 2018-05-04 DIAGNOSIS — F411 Generalized anxiety disorder: Secondary | ICD-10-CM | POA: Diagnosis present

## 2018-05-04 DIAGNOSIS — G4733 Obstructive sleep apnea (adult) (pediatric): Secondary | ICD-10-CM | POA: Diagnosis present

## 2018-05-04 DIAGNOSIS — I484 Atypical atrial flutter: Secondary | ICD-10-CM

## 2018-05-04 DIAGNOSIS — I482 Chronic atrial fibrillation: Secondary | ICD-10-CM

## 2018-05-04 DIAGNOSIS — Z8673 Personal history of transient ischemic attack (TIA), and cerebral infarction without residual deficits: Secondary | ICD-10-CM

## 2018-05-04 DIAGNOSIS — I6930 Unspecified sequelae of cerebral infarction: Secondary | ICD-10-CM

## 2018-05-04 DIAGNOSIS — I5043 Acute on chronic combined systolic (congestive) and diastolic (congestive) heart failure: Secondary | ICD-10-CM

## 2018-05-04 DIAGNOSIS — E039 Hypothyroidism, unspecified: Secondary | ICD-10-CM

## 2018-05-04 HISTORY — PX: AV NODE ABLATION: EP1193

## 2018-05-04 LAB — BASIC METABOLIC PANEL
Anion gap: 9 (ref 5–15)
Anion gap: 11 (ref 5–15)
BUN: 16 mg/dL (ref 6–20)
BUN: 16 mg/dL (ref 6–20)
CO2: 21 mmol/L — ABNORMAL LOW (ref 22–32)
CO2: 20 mmol/L — ABNORMAL LOW (ref 22–32)
Calcium: 9.2 mg/dL (ref 8.9–10.3)
Calcium: 9 mg/dL (ref 8.9–10.3)
Chloride: 109 mmol/L (ref 101–111)
Chloride: 108 mmol/L (ref 101–111)
Creatinine, Ser: 1.19 mg/dL (ref 0.61–1.24)
Creatinine, Ser: 1.23 mg/dL (ref 0.61–1.24)
GFR calc Af Amer: >60 mL/min (ref >60)
GFR calc Af Amer: >60 mL/min (ref >60)
GFR calc non Af Amer: 58 mL/min — ABNORMAL LOW (ref >60)
GFR calc non Af Amer: 56 mL/min — ABNORMAL LOW (ref >60)
Glucose, Bld: 118 mg/dL — ABNORMAL HIGH (ref 65–99)
Glucose, Bld: 156 mg/dL — ABNORMAL HIGH (ref 65–99)
Potassium: 4.3 mmol/L (ref 3.5–5.1)
Potassium: 4.6 mmol/L (ref 3.5–5.1)
Sodium: 139 mmol/L (ref 135–145)
Sodium: 139 mmol/L (ref 135–145)

## 2018-05-04 LAB — CBC
HCT: 46.1 % (ref 39.0–52.0)
HCT: 45.7 % (ref 39.0–52.0)
Hemoglobin: 14.8 g/dL (ref 13.0–17.0)
Hemoglobin: 14.8 g/dL (ref 13.0–17.0)
MCH: 29.7 pg (ref 26.0–34.0)
MCH: 30.2 pg (ref 26.0–34.0)
MCHC: 32.1 g/dL (ref 30.0–36.0)
MCHC: 32.4 g/dL (ref 30.0–36.0)
MCV: 92.6 fL (ref 78.0–100.0)
MCV: 93.3 fL (ref 78.0–100.0)
Platelets: 158 10*3/uL (ref 150–400)
Platelets: 158 10*3/uL (ref 150–400)
RBC: 4.98 MIL/uL (ref 4.22–5.81)
RBC: 4.9 MIL/uL (ref 4.22–5.81)
RDW: 13.9 % (ref 11.5–15.5)
RDW: 14 % (ref 11.5–15.5)
WBC: 5.9 10*3/uL (ref 4.0–10.5)
WBC: 5.7 10*3/uL (ref 4.0–10.5)

## 2018-05-04 LAB — POCT ACTIVATED CLOTTING TIME: Activated Clotting Time: 180 seconds

## 2018-05-04 SURGERY — AV NODE ABLATION

## 2018-05-04 MED ORDER — ACETAMINOPHEN 325 MG PO TABS
650.0000 mg | ORAL_TABLET | ORAL | Status: DC | PRN
  Administered 2018-05-04 – 2018-05-05 (×3): 650 mg via ORAL
  Filled 2018-05-04 (×3): qty 2

## 2018-05-04 MED ORDER — HEPARIN SODIUM (PORCINE) 1000 UNIT/ML IJ SOLN
INTRAMUSCULAR | Status: DC | PRN
  Administered 2018-05-04: 5000 [IU] via INTRAVENOUS

## 2018-05-04 MED ORDER — FUROSEMIDE 10 MG/ML IJ SOLN
80.0000 mg | Freq: Two times a day (BID) | INTRAMUSCULAR | Status: DC
  Administered 2018-05-04 – 2018-05-05 (×2): 80 mg via INTRAVENOUS
  Filled 2018-05-04 (×3): qty 8

## 2018-05-04 MED ORDER — HEPARIN (PORCINE) IN NACL 1000-0.9 UT/500ML-% IV SOLN
INTRAVENOUS | Status: AC
  Filled 2018-05-04: qty 500

## 2018-05-04 MED ORDER — IOPAMIDOL (ISOVUE-370) INJECTION 76%
INTRAVENOUS | Status: AC
  Filled 2018-05-04: qty 125

## 2018-05-04 MED ORDER — IOPAMIDOL (ISOVUE-370) INJECTION 76%
INTRAVENOUS | Status: AC
  Filled 2018-05-04: qty 100

## 2018-05-04 MED ORDER — HEPARIN SODIUM (PORCINE) 1000 UNIT/ML IJ SOLN
INTRAMUSCULAR | Status: AC
  Filled 2018-05-04: qty 1

## 2018-05-04 MED ORDER — SODIUM CHLORIDE 0.9 % IV SOLN: 250.0000 mL | INTRAVENOUS | Status: DC | PRN

## 2018-05-04 MED ORDER — HEPARIN (PORCINE) IN NACL 2-0.9 UNITS/ML
INTRAMUSCULAR | Status: AC | PRN
  Administered 2018-05-04: 500 mL

## 2018-05-04 MED ORDER — BUPIVACAINE HCL (PF) 0.25 % IJ SOLN
INTRAMUSCULAR | Status: AC
  Filled 2018-05-04: qty 30

## 2018-05-04 MED ORDER — IOPAMIDOL (ISOVUE-370) INJECTION 76%
100.0000 mL | Freq: Once | INTRAVENOUS | Status: AC | PRN
  Administered 2018-05-04: 125 mL via INTRAVENOUS

## 2018-05-04 MED ORDER — MIDAZOLAM HCL 5 MG/5ML IJ SOLN
INTRAMUSCULAR | Status: AC
  Filled 2018-05-04: qty 5

## 2018-05-04 MED ORDER — ADULT MULTIVITAMIN W/MINERALS CH
1.0000 | ORAL_TABLET | Freq: Every day | ORAL | Status: DC
  Administered 2018-05-05: 1 via ORAL
  Filled 2018-05-04: qty 1

## 2018-05-04 MED ORDER — BUPIVACAINE HCL (PF) 0.25 % IJ SOLN
INTRAMUSCULAR | Status: DC | PRN
  Administered 2018-05-04: 20 mL

## 2018-05-04 MED ORDER — SODIUM CHLORIDE 0.9% FLUSH
3.0000 mL | Freq: Two times a day (BID) | INTRAVENOUS | Status: DC
  Administered 2018-05-04 – 2018-05-05 (×2): 3 mL via INTRAVENOUS

## 2018-05-04 MED ORDER — ONDANSETRON HCL 4 MG/2ML IJ SOLN: 4.0000 mg | Freq: Four times a day (QID) | INTRAMUSCULAR | Status: DC | PRN

## 2018-05-04 MED ORDER — FENTANYL CITRATE (PF) 100 MCG/2ML IJ SOLN
INTRAMUSCULAR | Status: AC
  Filled 2018-05-04: qty 2

## 2018-05-04 MED ORDER — FLUTICASONE FUROATE-VILANTEROL 100-25 MCG/INH IN AEPB
1.0000 | INHALATION_SPRAY | Freq: Every day | RESPIRATORY_TRACT | Status: DC
  Administered 2018-05-04 – 2018-05-05 (×2): 1 via RESPIRATORY_TRACT

## 2018-05-04 MED ORDER — SODIUM CHLORIDE 0.9% FLUSH: 3.0000 mL | INTRAVENOUS | Status: DC | PRN

## 2018-05-04 MED FILL — Gentamicin Sulfate Inj 40 MG/ML: INTRAMUSCULAR | Qty: 80 | Status: AC

## 2018-05-04 SURGICAL SUPPLY — 9 items
BAG SNAP BAND KOVER 36X36 (MISCELLANEOUS) ×2 IMPLANT
CATH CELSIUS THERMO F CV 7FR (ABLATOR) ×2 IMPLANT
PACK EP LATEX FREE (CUSTOM PROCEDURE TRAY) ×1
PACK EP LF (CUSTOM PROCEDURE TRAY) ×1 IMPLANT
PAD DEFIB LIFELINK (PAD) ×2 IMPLANT
SHEATH INTROD W/O MIN 9FR 25CM (SHEATH) ×2 IMPLANT
SHEATH PINNACLE 8F 10CM (SHEATH) ×4 IMPLANT
SHIELD RADPAD SCOOP 12X17 (MISCELLANEOUS) ×2 IMPLANT
WIRE HI TORQ VERSACORE-J 145CM (WIRE) ×2 IMPLANT

## 2018-05-04 NOTE — Interval H&P Note (Signed)
History and Physical Interval Note:  05/04/2018 1:19 PM  Michael Delacruz  has presented today for surgery, with the diagnosis of av  The various methods of treatment have been discussed with the patient and family. After consideration of risks, benefits and other options for treatment, the patient has consented to  Procedure(s): AV NODE ABLATION (N/A) as a surgical intervention .  The patient's history has been reviewed, patient examined, no change in status, stable for surgery.  I have reviewed the patient's chart and labs.  Questions were answered to the patient's satisfaction.    Long discussion the patient as well as with Dr Caryl Comes.  Pt planned for AV nodal ablation today by me by Dr Caryl Comes.  CXR and device interrogation are stable.  Pt with afib with RVR not responsive to medical therapy.  Risks and benefits to AV nodal ablation were discussed with patient who wishes to proceed.  Thompson Grayer

## 2018-05-04 NOTE — Progress Notes (Signed)
Cath lab states pt is next on their list. RN notified CT to see if pt can be seen next. CT states they will send transport for pt.

## 2018-05-04 NOTE — Progress Notes (Signed)
Pt SpO2= 90% on room air while ambulating. Pt SpO2= 92% on room air while in bed. MD notified.

## 2018-05-04 NOTE — Progress Notes (Addendum)
Electrophysiology Rounding Note  Patient Name: Michael Delacruz Date of Encounter: 05/04/2018  Primary Cardiologist: [name] Electrophysiologist: Caryl Comes   Subjective   The patient is doing ok today.  At this time, the patient denies chest pain, shortness of breath, or any new concerns. He did not sleep well last night.  He also is concerned about not "thinking clearly" and his family reports hallucinations intermittently.   Inpatient Medications    Scheduled Meds: . aspirin EC  81 mg Oral Daily  . atorvastatin  40 mg Oral q1800  . carvedilol  6.25 mg Oral BID WC  . docusate sodium  100 mg Oral BID  . fluticasone  2 spray Each Nare Daily  . fluticasone furoate-vilanterol  1 puff Inhalation Daily  . furosemide  40 mg Oral Daily  . multivitamin with minerals  1 tablet Oral Daily  . pantoprazole  40 mg Oral Daily  . potassium chloride  40 mEq Oral TID   Continuous Infusions: . sodium chloride 50 mL/hr at 05/03/18 1611  .  ceFAZolin (ANCEF) IV 1 g (05/04/18 1275)   PRN Meds: acetaminophen, albuterol, ALPRAZolam, ondansetron (ZOFRAN) IV, polyethylene glycol powder, sodium chloride   Vital Signs    Vitals:   05/03/18 1145 05/03/18 1252 05/03/18 2126 05/04/18 0643  BP: 96/70 90/79 109/84 112/84  Pulse: 100 (!) 109 (!) 111 (!) 113  Resp: (!) 28 18 18    Temp:   97.7 F (36.5 C)   TempSrc:   Oral   SpO2: 97% 94% 93% 100%  Weight:    191 lb 1.6 oz (86.7 kg)  Height:        Intake/Output Summary (Last 24 hours) at 05/04/2018 0702 Last data filed at 05/04/2018 1700 Gross per 24 hour  Intake 175 ml  Output 500 ml  Net -325 ml   Filed Weights   05/03/18 0100 05/04/18 0643  Weight: 188 lb 6.4 oz (85.5 kg) 191 lb 1.6 oz (86.7 kg)    Physical Exam    GEN- The patient is well appearing, alert and oriented x 3 today.   Head- normocephalic, atraumatic Eyes-  Sclera clear, conjunctiva pink Ears- hearing intact Oropharynx- clear Neck- supple Lungs- Clear to ausculation  bilaterally, normal work of breathing Heart- Tachycardic regular rate and rhythm GI- soft, NT, ND, + BS Extremities- no clubbing, cyanosis, or edema Skin- no rash or lesion Psych- euthymic mood, full affect Neuro- strength and sensation are intact  Labs    CBC Recent Labs    05/02/18 2031 05/03/18 0502  WBC 5.5 5.0  NEUTROABS 3.2  --   HGB 15.4 15.0  HCT 47.0 45.9  MCV 92.0 92.9  PLT 187 174   Basic Metabolic Panel Recent Labs    05/02/18 2031 05/03/18 0502  NA 142 137  K 3.3* 3.8  CL 103 103  CO2 27 23  GLUCOSE 97 125*  BUN 19 20  CREATININE 1.41* 1.61*  CALCIUM 9.9 9.4  MG 2.2  --    Liver Function Tests Recent Labs    05/01/18 1023  AST 22  ALT 17  ALKPHOS 74  BILITOT 1.2  PROT 6.6  ALBUMIN 4.1  Cardiac Enzymes Recent Labs    05/01/18 1023  TROPONINI <0.03   Hemoglobin A1C Recent Labs    05/01/18 1023  HGBA1C 5.5   Fasting Lipid Panel Recent Labs    05/01/18 1023  CHOL 215*  HDL 42  LDLCALC 157*  TRIG 79  CHOLHDL 5.1   Thyroid Function  Tests Recent Labs    05/02/18 2031  TSH 15.425*    Telemetry    Atrial flutter, 110's (personally reviewed)  Radiology    No results found.  Patient Profile     Michael Delacruz is a 77 y.o. male admitted for PPM and AVN ablation  Assessment & Plan    1. Atrial flutter with RVR Plan for AVN ablation today CHADS2VASC is 5, pt s/p AtriClip, Dr Caryl Comes to decide about long term anticoagulation  2. Acute on chronic systolic heart failure S/p CRTP yesterday CXR with leads in stable position device interrogation pending BMET pending this morning  3. Presumed tachycardia mediated cardiomyopathy Update echo 3 months post ablation  4. Confusion/hallucinations Pt reports "trouble thinking" for the last few weeks Family also reports hallucinations Will ask medicine to see today    Signed, Chanetta Marshall, NP  05/04/2018, 7:02 AM

## 2018-05-04 NOTE — H&P (View-Only) (Signed)
Electrophysiology Rounding Note  Patient Name: Michael Delacruz Date of Encounter: 05/04/2018  Primary Cardiologist: [name] Electrophysiologist: Caryl Comes   Subjective   The patient is doing ok today.  At this time, the patient denies chest pain, shortness of breath, or any new concerns. He did not sleep well last night.  He also is concerned about not "thinking clearly" and his family reports hallucinations intermittently.   Inpatient Medications    Scheduled Meds: . aspirin EC  81 mg Oral Daily  . atorvastatin  40 mg Oral q1800  . carvedilol  6.25 mg Oral BID WC  . docusate sodium  100 mg Oral BID  . fluticasone  2 spray Each Nare Daily  . fluticasone furoate-vilanterol  1 puff Inhalation Daily  . furosemide  40 mg Oral Daily  . multivitamin with minerals  1 tablet Oral Daily  . pantoprazole  40 mg Oral Daily  . potassium chloride  40 mEq Oral TID   Continuous Infusions: . sodium chloride 50 mL/hr at 05/03/18 1611  .  ceFAZolin (ANCEF) IV 1 g (05/04/18 8841)   PRN Meds: acetaminophen, albuterol, ALPRAZolam, ondansetron (ZOFRAN) IV, polyethylene glycol powder, sodium chloride   Vital Signs    Vitals:   05/03/18 1145 05/03/18 1252 05/03/18 2126 05/04/18 0643  BP: 96/70 90/79 109/84 112/84  Pulse: 100 (!) 109 (!) 111 (!) 113  Resp: (!) 28 18 18    Temp:   97.7 F (36.5 C)   TempSrc:   Oral   SpO2: 97% 94% 93% 100%  Weight:    191 lb 1.6 oz (86.7 kg)  Height:        Intake/Output Summary (Last 24 hours) at 05/04/2018 0702 Last data filed at 05/04/2018 6606 Gross per 24 hour  Intake 175 ml  Output 500 ml  Net -325 ml   Filed Weights   05/03/18 0100 05/04/18 0643  Weight: 188 lb 6.4 oz (85.5 kg) 191 lb 1.6 oz (86.7 kg)    Physical Exam    GEN- The patient is well appearing, alert and oriented x 3 today.   Head- normocephalic, atraumatic Eyes-  Sclera clear, conjunctiva pink Ears- hearing intact Oropharynx- clear Neck- supple Lungs- Clear to ausculation  bilaterally, normal work of breathing Heart- Tachycardic regular rate and rhythm GI- soft, NT, ND, + BS Extremities- no clubbing, cyanosis, or edema Skin- no rash or lesion Psych- euthymic mood, full affect Neuro- strength and sensation are intact  Labs    CBC Recent Labs    05/02/18 2031 05/03/18 0502  WBC 5.5 5.0  NEUTROABS 3.2  --   HGB 15.4 15.0  HCT 47.0 45.9  MCV 92.0 92.9  PLT 187 301   Basic Metabolic Panel Recent Labs    05/02/18 2031 05/03/18 0502  NA 142 137  K 3.3* 3.8  CL 103 103  CO2 27 23  GLUCOSE 97 125*  BUN 19 20  CREATININE 1.41* 1.61*  CALCIUM 9.9 9.4  MG 2.2  --    Liver Function Tests Recent Labs    05/01/18 1023  AST 22  ALT 17  ALKPHOS 74  BILITOT 1.2  PROT 6.6  ALBUMIN 4.1  Cardiac Enzymes Recent Labs    05/01/18 1023  TROPONINI <0.03   Hemoglobin A1C Recent Labs    05/01/18 1023  HGBA1C 5.5   Fasting Lipid Panel Recent Labs    05/01/18 1023  CHOL 215*  HDL 42  LDLCALC 157*  TRIG 79  CHOLHDL 5.1   Thyroid Function  Tests Recent Labs    05/02/18 2031  TSH 15.425*    Telemetry    Atrial flutter, 110's (personally reviewed)  Radiology    No results found.  Patient Profile     Michael Delacruz is a 77 y.o. male admitted for PPM and AVN ablation  Assessment & Plan    1. Atrial flutter with RVR Plan for AVN ablation today CHADS2VASC is 5, pt s/p AtriClip, Dr Caryl Comes to decide about long term anticoagulation  2. Acute on chronic systolic heart failure S/p CRTP yesterday CXR with leads in stable position device interrogation pending BMET pending this morning  3. Presumed tachycardia mediated cardiomyopathy Update echo 3 months post ablation  4. Confusion/hallucinations Pt reports "trouble thinking" for the last few weeks Family also reports hallucinations Will ask medicine to see today    Signed, Chanetta Marshall, NP  05/04/2018, 7:02 AM

## 2018-05-04 NOTE — Progress Notes (Signed)
Left upper chest area with dermabond to surgical incision. No drainage. Bruising present at site

## 2018-05-04 NOTE — Progress Notes (Signed)
Patient received back to room from cath lab. Awake, oriented. V/ S obtained and WNL.  Right groin with gauze dressing dry and intact.  Patient bedrest until 11:15 PM this evening.

## 2018-05-04 NOTE — Progress Notes (Signed)
Site area: rt fa sheath pulled then 5 minutes later rt fv sheath pulled Site Prior to Removal:  Level 0 Pressure Applied For: 25 minutes Manual:   yes Patient Status During Pull:  stable Post Pull Site:  Level 0 Post Pull Instructions Given:  yes Post Pull Pulses Present: palpable Dressing Applied:  Gauze and tegderm Bedrest begins @ 8478 Comments: IV saline locked

## 2018-05-04 NOTE — Progress Notes (Signed)
Patient c/o 8/10 left sided chest pain. Dr. Rayann Heman by to see patient. Discomfort is worse when Dr. Rayann Heman palpates left chest area below nipple. No orders.

## 2018-05-04 NOTE — Consult Note (Signed)
Medical Consultation   Michael Delacruz  OJJ:[id number]  DOB: [birth year]  DOA: 05/02/2018  PCP: [name], DO   Outpatient Specialists: Dr Roxy Manns - CT Surgery; Cardiology Dr. [name];    Requesting physician: Dr. Marlou Porch  Reason for consultation: Patient feels confused and "just not right", family states he has had episodes of hallucinating.  History of Present Illness: Michael Delacruz is an 77 y.o. male past medical history significant for mitral valve abnormality status post mitral valve repair in 2016, atypical atrial flutter with tachycardia resulting in a cardiomyopathy status post permanent pacemaker with AV node ablation this hospitalization, sleep apnea untreated, hypertension, fibromyalgia, evaluation by psychiatry for medication noncompliance found to not require commitment (but was found to have severe major depression and anxiety disorder) who was referred to me this morning due to confusion and not feeling quite right.  Patient's recent history is that he has been in permanent atrial fibrillation he presented to the hospital at [facility] and was found to be short of breath, hypoxic and in congestive heart failure and was admitted for acute exacerbation of congestive heart failure.  He was found to have a cardiomyopathy due to a profound tachycardia associated with his atrial fibrillation and was transferred from [facility] to [facility] in [place] for permanent pacemaker and AV nodal ablation.  His family has had concerns because he has had intermittent episodes of hallucinations and review of the chart also reveals that the patient has had feelings like he is going to die repeatedly saying "I am going to die" despite having extensive counseling from physicians and staff that his addition is treatable.  Of note patient does take Xanax twice daily at home and does not know if he has been getting it here.  In the past he has been offered  anticoagulation for prevention but he has refused due to concerns about retinal bleeding.  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  Patient himself states that he is not thinking clearly and that he does not feel quite right.  He does smoke 1/4 pack of cigarettes a day and is not on an nicotine patch and reports to me that he does not know if he is been getting his Xanax.  Review of his chart reveals episodes of hypoxemia but he is currently not on any home oxygen he is also has untreated sleep apnea which may be also contributing to his episodes of confusion.  He is now on hospital day for and is an anxious person at baseline and may be having some decompensation due to his confusion I was asked to further evaluate and treat the patient. He personally denies confusion.  He states that he has been very compliant with his medications since January.  At that point he had been seen by psychiatry due to medication noncompliance and thoughts of needing commitment which was felt to be not warranted at the time.  He complains of shortness of breath with small ambulation he states that prior to his mitral valve replacement he was able to walk 3 to 10 miles a day and since then he said he can barely get to the mailbox.  He feels that he was previously very fit and now is not very fit at all.  He does have a prior history  of heavy alcohol use but states that in the past 2 to 3 years he has drank maybe 1 bottle of liquor in the entire 2 to 3 years.  He denies chest pain headache, nausea, vomiting, dysuria, urinary frequency, diarrhea, constipation, rashes, masses, he does complain of difficulty with vision in his left eye which has been chronic since his stroke.  At that time he believes he had a retinal hemorrhage.   Review of Systems:  ROS As per HPI otherwise 10 point review of systems negative.     Past Medical  History: Past Medical History:  Diagnosis Date  . Arthritis   . Asthma   . Bradycardia   . CAD in native artery    a. LHC 09/2015: 40% pCx, 35% mRCA.  [name] Chronic diastolic congestive heart failure ([place])   . COPD (chronic obstructive pulmonary disease) ([place])   . Depression   . Dilation of intestine 01/2015  . Fibromyalgia   . Frequent headaches   . GERD (gastroesophageal reflux disease)   . History of blood clots    eye   . History of hiatal hernia   . History of rheumatic fever   . Hypertension   . Kidney stone   . PAF (paroxysmal atrial fibrillation) ([place])    a. s/p TEE/DCCV 07/2015. b. H/o bleeding on Coumadin when INR >5, changed to Eliquis.  . S/P Minimally invasive maze operation for atrial fibrillation 10/22/2015   Complete bilateral atrial lesion set using cryothermy and bipolar radiofrequency ablation with clipping of LA appendage via right mini thoracotomy approach  . S/P minimally invasive mitral valve repair 10/22/2015   Complex valvuloplasty including triangular resection of posterior leaflet, artificial Gore-tex neochord placement x6 and 38 mm Sorin Memo 3D Rechord ring annuloplasty via right minithoracotomy approach  . Severe mitral regurgitation    a. s/p MV repair 10/2015.  [name] Sleep apnea   . Stroke ([place])   . Thyroid disorder   . TIA (transient ischemic attack)   . Tobacco abuse     Past Surgical History: Past Surgical History:  Procedure Laterality Date  . ANKLE SURGERY    . BIV PACEMAKER INSERTION CRT-P N/A 05/03/2018   Procedure: BIV PACEMAKER INSERTION CRT-P;  Surgeon: Deboraha Sprang, MD;  Location: [facility];  Service: Cardiovascular;  Laterality: N/A;  . CARDIAC CATHETERIZATION N/A 10/03/2015   Procedure: Right and Left Heart Cath and Coronary Angiography;  Surgeon: [name], MD;  Location: [facility];  Service: Cardiovascular;  Laterality: N/A;  . COLONOSCOPY    . ELECTROPHYSIOLOGIC STUDY N/A 08/18/2015   Procedure:  CARDIOVERSION;  Surgeon: Wellington Hampshire, MD;  Location: [facility];  Service: Cardiovascular;  Laterality: N/A;  . MINIMALLY INVASIVE MAZE PROCEDURE N/A 10/22/2015   Procedure: MINIMALLY INVASIVE MAZE PROCEDURE;  Surgeon: Rexene Alberts, MD;  Location: [facility];  Service: Open Heart Surgery;  Laterality: N/A;  . MITRAL VALVE REPAIR Right 10/22/2015   Procedure: MINIMALLY INVASIVE MITRAL VALVE REPAIR (MVR);  Surgeon: Rexene Alberts, MD;  Location: [place];  Service: Open Heart Surgery;  Laterality: Right;  . SINUS EXPLORATION    . TEE WITHOUT CARDIOVERSION N/A 08/18/2015   Procedure: TRANSESOPHAGEAL ECHOCARDIOGRAM (TEE);  Surgeon: Wellington Hampshire, MD;  Location: [facility];  Service: Cardiovascular;  Laterality: N/A;  . TEE WITHOUT CARDIOVERSION N/A 10/22/2015   Procedure: TRANSESOPHAGEAL ECHOCARDIOGRAM (TEE);  Surgeon: Rexene Alberts, MD;  Location: [place];  Service: Open Heart Surgery;  Laterality: N/A;  Allergies:   Allergies  Allergen Reactions  . Codeine Nausea Only  . Digoxin And Related     SOB, bad dreams  . Macrodantin [Nitrofurantoin Macrocrystal] Rash  . Morphine And Related Rash     Social History:  reports that he has been smoking cigarettes.  He has a 26.00 pack-year smoking history. He has never used smokeless tobacco. He reports that he does not drink alcohol or use drugs.   Family History: Family History  Problem Relation Age of Onset  . Stroke Mother   . Irregular heart beat Mother   . Heart murmur Brother   . Pulmonary embolism Brother   . Hypertension Other       Physical Exam: Vitals:   05/04/18 0643 05/04/18 0702 05/04/18 0835 05/04/18 0924  BP: 112/84  103/78   Pulse: (!) 113  (!) 111   Resp:      Temp:  97.7 F (36.5 C)    TempSrc:  Oral    SpO2: 100%   98%  Weight: 86.7 kg (191 lb 1.6 oz)     Height:        Constitutional: Well-developed well-nourished male eating breakfast in bed alert and awake, oriented x3, not in any acute distress. Eyes:  PERLA, EOMI, irises appear normal, anicteric sclera,  ENMT: external ears and nose appear normal, Hearing normal            Lips appears normal, oropharynx mucosa, tongue, posterior pharynx appear normal  Neck: neck appears normal, no masses, normal ROM, no thyromegaly, no JVD  CVS: S1-S2 clear, no murmur rubs or gallops, no LE edema, normal pedal pulses  Respiratory:  clear to auscultation bilaterally, no wheezing, rales or rhonchi. Respiratory effort normal. No accessory muscle use.  Abdomen: soft nontender, nondistended, normal bowel sounds, no hepatosplenomegaly, no hernias  Musculoskeletal: : no cyanosis, clubbing or edema noted bilaterally; good tone no arthropathy Neuro: Cranial nerves II-XII intact, strength, sensation, reflexes; no tremors, no cogwheel rigidity Psych: judgement and insight appear normal, stable mood and affect, I personally performed a Mini-Mental Status Examination on the patient he scored a 26 out of 30 having missed the date hospital room in the hospital floor and one object on recall, given his level of education which is high school he has a normal Mini-Mental Status Examination. Skin: no rashes or lesions or ulcers, no induration or nodules     Data reviewed:  I have personally reviewed following labs and imaging studies Labs:  CBC: Recent Labs  Lab 04/30/18 1458  05/02/18 0506 05/02/18 2031 05/03/18 0502 05/04/18 0805 05/04/18 0953  WBC 5.0   < > 7.2 5.5 5.0 5.9 5.7  NEUTROABS 3.6  --   --  3.2  --   --   --   HGB 14.9   < > 15.7 15.4 15.0 14.8 14.8  HCT 44.5   < > 46.2 47.0 45.9 46.1 45.7  MCV 92.8   < > 92.0 92.0 92.9 92.6 93.3  PLT 153   < > 159 187 168 158 158   < > = values in this interval not displayed.    Basic Metabolic Panel: Recent Labs  Lab 05/01/18 1023 05/02/18 1316 05/02/18 2031 05/03/18 0502 05/04/18 0805  NA 136 141 142 137 139  K 3.4* 3.9 3.3* 3.8 4.3  CL 106 101 103 103 109  CO2 23 28 27 23  21*  GLUCOSE 150* 117* 97  125* 118*  BUN 12 18 19 20 16   CREATININE 1.10  1.36* 1.41* 1.61* 1.19  CALCIUM 8.6* 9.8 9.9 9.4 9.2  MG  --   --  2.2  --   --    GFR Estimated Creatinine Clearance: 56.2 mL/min (by C-G formula based on SCr of 1.19 mg/dL). Liver Function Tests: Recent Labs  Lab 05/01/18 1023  AST 22  ALT 17  ALKPHOS 74  BILITOT 1.2  PROT 6.6  ALBUMIN 4.1   No results for input(s): LIPASE, AMYLASE in the last 168 hours. No results for input(s): AMMONIA in the last 168 hours. Coagulation profile Recent Labs  Lab 05/01/18 1920 05/03/18 0502  INR 1.02 1.10    Cardiac Enzymes: Recent Labs  Lab 04/30/18 1458 04/30/18 2109 05/01/18 0412 05/01/18 1023  TROPONINI <0.03 0.03* 0.03* <0.03    Thyroid function studies Recent Labs    05/02/18 2031  TSH 15.425*   Anemia work up Urinalysis    Component Value Date/Time   COLORURINE YELLOW (A) 01/16/2018 1846   APPEARANCEUR CLEAR (A) 01/16/2018 1846   APPEARANCEUR CLEAR 01/24/2015 1849   LABSPEC 1.019 01/16/2018 1846   LABSPEC 1.025 01/24/2015 1849   PHURINE 5.0 01/16/2018 1846   GLUCOSEU NEGATIVE 01/16/2018 1846   GLUCOSEU NEGATIVE 01/24/2015 1849   HGBUR SMALL (A) 01/16/2018 1846   BILIRUBINUR NEGATIVE 01/16/2018 1846   BILIRUBINUR NEGATIVE 01/24/2015 1849   KETONESUR NEGATIVE 01/16/2018 1846   PROTEINUR NEGATIVE 01/16/2018 1846   UROBILINOGEN 1.0 10/20/2015 1259   NITRITE NEGATIVE 01/16/2018 1846   LEUKOCYTESUR NEGATIVE 01/16/2018 1846   LEUKOCYTESUR TRACE 01/24/2015 1849     Microbiology Recent Results (from the past 240 hour(s))  Surgical pcr screen     Status: None   Collection Time: 05/02/18  8:56 PM  Result Value Ref Range Status   MRSA, PCR NEGATIVE NEGATIVE Final   Staphylococcus aureus NEGATIVE NEGATIVE Final    Comment: (NOTE) The Xpert SA Assay (FDA approved for NASAL specimens in patients 45 years of age and older), is one component of a comprehensive surveillance program. It is not intended to diagnose  infection nor to guide or monitor treatment. Performed at [facility], [place] [address] [place], [place] [address]     EKG personally viewed by me: Shows accelerated junctional rhythm with ST-T wave abnormalities.   Inpatient Medications:   Scheduled Meds: . aspirin EC  81 mg Oral Daily  . atorvastatin  40 mg Oral q1800  . carvedilol  6.25 mg Oral BID WC  . docusate sodium  100 mg Oral BID  . fluticasone  2 spray Each Nare Daily  . fluticasone furoate-vilanterol  1 puff Inhalation Daily  . furosemide  40 mg Oral Daily  . multivitamin with minerals  1 tablet Oral Daily  . pantoprazole  40 mg Oral Daily  . potassium chloride  40 mEq Oral TID   Continuous Infusions: . sodium chloride 50 mL/hr at 05/03/18 1611     Radiological Exams on Admission: Dg Chest 2 View  Result Date: 05/04/2018 CLINICAL DATA:  AICD. EXAM: CHEST - 2 VIEW COMPARISON:  04/30/2018.  CT 03/07/2018. FINDINGS: AICD noted in good anatomic position. Cardiac valve replacement. Left atrial appendage clip noted. Cardiomegaly with normal pulmonary vascularity. Interim partial clearing of pulmonary interstitial edema. No pleural effusion or pneumothorax. No acute bony abnormality. IMPRESSION: 1.  AICD noted with lead tips in good anatomic position. 2. Prior cardiac valve replacement. Left atrial appendage clip noted. Persistent cardiomegaly. Interim partial clearing of pulmonary interstitial edema. Electronically Signed   By: Marcello Moores  Register  On: 05/04/2018 10:13    Impression/Recommendations Principal Problem:   Permanent atrial fibrillation (HCC) Active Problems:   Hypothyroidism   Shortness of breath   Sleep apnea, obstructive   Retinal hemorrhage, left eye   Late effects of cerebral ischemic stroke   COPD (chronic obstructive pulmonary disease) (HCC)   Pulmonary HTN (HCC)   Smoking   Generalized anxiety disorder   History of stroke   SOB (shortness of breath)   Mitral valve regurgitation   CAD  (coronary artery disease)   S/P Minimally invasive maze operation for atrial fibrillation   S/P MVR (mitral valve repair)   Atrial fibrillation with RVR (HCC)   Acute on chronic combined systolic and diastolic CHF (congestive heart failure) (HCC)   Tachycardia induced cardiomyopathy (HCC)   Hypokalemia   1.  Permanent atrial fibrillation patient in the process of being treated for that with ablation and pacemaker placement.  He is scheduled to go back to the lab today for EP study.  Management per cardiology.  2.  Hypothyroidism: Patient with an elevated TSH in the 15 range this does warrant treatment.  I will send a further full thyroid panel however patient will require treatment with Synthroid we can start tomorrow after elation and placement of pacemaker.  3.  Sleep apnea with associated shortness of breath, COPD, history of smoking, and confusion: I believe all of these problems are related to low-grade hypoxemia.  Patient does have an O2 sat record of 89% on one occasion here in the hospital.  He is currently off O2.  I am going to ask that we ambulate him in the hall and check his ambulating O2 sat.  I suspect that he will require oxygen given his multiple issues including pulmonary hypertension, COPD, and sleep apnea.  If his oxygen saturations are acceptable ambulating he may require nocturnal oxygen saturation monitoring for possible use of oxygen with the CPAP.  He will certainly need to have another sleep study done as an outpatient and I have recommended highly that the patient have his sleep apnea treated.  He told me he stopped using the CPAP machine because the machine made him feel like he was choking.  I do believe that the patient should take his Xanax before going to bed at night I think that his anxiety level is so high that he is unable to tolerate the CPAP machine but he needs the CPAP machine.  And this is 1 of the rare cases where I believe the benzodiazepine would be helpful in  managing a patient with a sleep disorder.  4.  History of left eye retinal hemorrhage: I had extensive discussion with the patient I do believe that he would benefit from anticoagulation for his atrial fibrillation.  He says that he will consider it if necessary.  5.  Tachycardia associated with COPD sleep apnea and hypoxemia: I am concerned that the patient may have a pulmonary embolism.  CTA of his chest and will rule this out given his hypoxemia.  Patient does agree to take anticoagulation if necessary for pulmonary embolism.  6.  Confusion.  Family reports possible hallucinations patient denies this.  His Mini-Mental status examination shows that he scores a 26 out of 30.  I spent a great deal of time reassuring him about his health and his mental health especially.  I do think the patient would benefit from treatment with a anxiety lytic type of SSRI or SNRI.  I will check a CTA of his head  to ensure that there is no evidence of stroke.  Cannot perform MRI as recent AICD was placed.  Patient does have extreme anxiety and I encouraged him to ask for his Xanax twice daily.  He may also be suffering a slight bit of Xanax withdrawal.  In summary I think the patient may benefit from a CT a of the head and chest to rule out PE and stroke, will check an ambulating O2 sat, will also start treating his anxiety with his home dosing of Xanax.  He would certainly benefit from an anxiety lytic as previously mentioned by psychiatry in January.  Thus far he has been unwilling to try it.  Once this testing is complete I will follow-up with the patient and document my results and recommendations.    Thank you for this consultation.  Our [facility] hospitalist team will follow the patient with you.   Time Spent: 75 minutes  Lady Deutscher M.D. [facility] 05/04/2018, 10:53 AM

## 2018-05-05 ENCOUNTER — Other Ambulatory Visit: Payer: Self-pay

## 2018-05-05 ENCOUNTER — Encounter (HOSPITAL_COMMUNITY): Payer: Self-pay | Admitting: Internal Medicine

## 2018-05-05 DIAGNOSIS — I4891 Unspecified atrial fibrillation: Secondary | ICD-10-CM

## 2018-05-05 DIAGNOSIS — Z9889 Other specified postprocedural states: Secondary | ICD-10-CM

## 2018-05-05 LAB — BASIC METABOLIC PANEL
Anion gap: 12 (ref 5–15)
BUN: 15 mg/dL (ref 6–20)
CO2: 25 mmol/L (ref 22–32)
Calcium: 9.2 mg/dL (ref 8.9–10.3)
Chloride: 103 mmol/L (ref 101–111)
Creatinine, Ser: 1.4 mg/dL — ABNORMAL HIGH (ref 0.61–1.24)
GFR calc Af Amer: 56 mL/min — ABNORMAL LOW (ref >60)
GFR calc non Af Amer: 48 mL/min — ABNORMAL LOW (ref >60)
Glucose, Bld: 166 mg/dL — ABNORMAL HIGH (ref 65–99)
Potassium: 3.9 mmol/L (ref 3.5–5.1)
Sodium: 140 mmol/L (ref 135–145)

## 2018-05-05 MED ORDER — CARVEDILOL 6.25 MG PO TABS: 6.2500 mg | ORAL_TABLET | Freq: Two times a day (BID) | ORAL | 1 refills | Status: DC

## 2018-05-05 MED FILL — Midazolam HCl Inj 5 MG/5ML (Base Equivalent): INTRAMUSCULAR | Qty: 5 | Status: AC

## 2018-05-05 MED FILL — Fentanyl Citrate Preservative Free (PF) Inj 100 MCG/2ML: INTRAMUSCULAR | Qty: 2 | Status: AC

## 2018-05-05 NOTE — Discharge Instructions (Signed)
° ° °  Supplemental Discharge Instructions for  Pacemaker/Defibrillator Patients  Activity No heavy lifting or vigorous activity with your left/right arm for 6 to 8 weeks.  Do not raise your left/right arm above your head for one week.  Gradually raise your affected arm as drawn below.           __          05/08/18                   05/09/18                    05/10/18                    05/11/18  NO DRIVING for 1 week    ; you may begin driving on  7/98/92   .  WOUND CARE - Keep the wound area clean and dry.  Do not get this area wet for one week. No showers for one week; you may shower on   05/11/18  . - The tape/steri-strips on your wound will fall off; do not pull them off.  No bandage is needed on the site.  DO  NOT apply any creams, oils, or ointments to the wound area. - If you notice any drainage or discharge from the wound, any swelling or bruising at the site, or you develop a fever > 101? F after you are discharged home, call the office at once.  Special Instructions - You are still able to use cellular telephones; use the ear opposite the side where you have your pacemaker/defibrillator.  Avoid carrying your cellular phone near your device. - When traveling through airports, show security personnel your identification card to avoid being screened in the metal detectors.  Ask the security personnel to use the hand wand. - Avoid arc welding equipment, MRI testing (magnetic resonance imaging), TENS units (transcutaneous nerve stimulators).  Call the office for questions about other devices. - Avoid electrical appliances that are in poor condition or are not properly grounded. - Microwave ovens are safe to be near or to operate.

## 2018-05-05 NOTE — Progress Notes (Signed)
PROGRESS NOTE    HUNTINGTON LEVERICH  UKG:[id number] DOB: 23-Aug-1941 DOA: 05/02/2018 PCP: [name], DO  Brief Narrative:77 y.o. male past medical history significant for mitral valve abnormality status post mitral valve repair in 2016, atypical atrial flutter with tachycardia resulting in a cardiomyopathy status post permanent pacemaker with AV node ablation this hospitalization, sleep apnea untreated, hypertension, fibromyalgia, evaluation by psychiatry for medication noncompliance found to not require commitment (but was found to have severe major depression and anxiety disorder) who was referred to me this morning due to confusion and not feeling quite right.  Patient's recent history is that he has been in permanent atrial fibrillation he presented to the hospital at [facility] and was found to be short of breath, hypoxic and in congestive heart failure and was admitted for acute exacerbation of congestive heart failure.  He was found to have a cardiomyopathy due to a profound tachycardia associated with his atrial fibrillation and was transferred from [facility] to [facility] in [place] for permanent pacemaker and AV nodal ablation.  His family has had concerns because he has had intermittent episodes of hallucinations and review of the chart also reveals that the patient has had feelings like he is going to die repeatedly saying "I am going to die" despite having extensive counseling from physicians and staff that his addition is treatable.  Of note patient does take Xanax twice daily at home and does not know if he has been getting it here.  In the past he has been offered anticoagulation for prevention but he has refused due to concerns about retinal bleeding.  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  Patient himself states that he is not thinking clearly and that he does not feel  quite right.  He does smoke 1/4 pack of cigarettes a day and is not on an nicotine patch and reports to me that he does not know if he is been getting his Xanax.  Review of his chart reveals episodes of hypoxemia but he is currently not on any home oxygen he is also has untreated sleep apnea which may be also contributing to his episodes of confusion.  He is now on hospital day for and is an anxious person at baseline and may be having some decompensation due to his confusion I was asked to further evaluate and treat the patient. He personally denies confusion.  He states that he has been very compliant with his medications since January.  At that point he had been seen by psychiatry due to medication noncompliance and thoughts of needing commitment which was felt to be not warranted at the time.  He complains of shortness of breath with small ambulation he states that prior to his mitral valve replacement he was able to walk 3 to 10 miles a day and since then he said he can barely get to the mailbox.  He feels that he was previously very fit and now is not very fit at all.  He does have a prior history of heavy alcohol use but states that in the past 2 to 3 years he has drank maybe 1 bottle of liquor in the entire 2 to 3 years.  He denies chest pain headache, nausea, vomiting, dysuria, urinary frequency, diarrhea, constipation, rashes, masses, he does complain of difficulty with vision in his left eye which has been chronic since his stroke.  At that time he believes he had a retinal hemorrhage.     Assessment & Plan:   Principal Problem:   Permanent atrial fibrillation (HCC) Active Problems:   SOB (shortness of breath)   Mitral valve regurgitation   COPD (chronic obstructive pulmonary disease) (HCC)   Pulmonary HTN (HCC)   Smoking   CAD (coronary artery disease)   Shortness of breath   S/P Minimally invasive maze operation for atrial fibrillation   S/P MVR (mitral valve repair)   Atrial  fibrillation with RVR (HCC)   Acute on chronic combined systolic and diastolic CHF (congestive heart failure) (HCC)   Tachycardia induced cardiomyopathy (HCC)   Hypokalemia   Generalized anxiety disorder   Sleep apnea, obstructive   History of stroke   Retinal hemorrhage, left eye   Late effects of cerebral ischemic stroke   Hypothyroidism  1] confusion change in mental status-when I saw the patient is morning he appeared to be in no confusion he knew where he was he was alert and oriented.  We will follow-up on the thyroid panel.  CT of the head and the chest does not reveal anything acute.  2] chronic atrial fibrillation with RVR patient here for ablation.   3] sleep apnea patient will need out patient sleep study.   Subjective: Complaints awake alert denies any chest pain shortness of breath.  Cough.  No urinary complaints.  Objective: Vitals:   05/05/18 0500 05/05/18 0627 05/05/18 0807 05/05/18 1226  BP: 107/87   101/77  Pulse: 94   90  Resp: 18   20  Temp: 97.7 F (36.5 C)   (!) 97.5 F (36.4 C)  TempSrc: Oral   Oral  SpO2: 94%  96% 95%  Weight:  84.1 kg (185 lb 8 oz)    Height:        Intake/Output Summary (Last 24 hours) at 05/05/2018 1253 Last data filed at 05/05/2018 1120 Gross per 24 hour  Intake 900 ml  Output 2200 ml  Net -1300 ml   Filed Weights   05/03/18 0100 05/04/18 0643 05/05/18 0627  Weight: 85.5 kg (188 lb 6.4 oz) 86.7 kg (191 lb 1.6 oz) 84.1 kg (185 lb 8 oz)    Examination:  General exam: Appears calm and comfortable  Respiratory system: Clear to auscultation. Respiratory effort normal. Cardiovascular system: S1 & S2 heard, RRR. No JVD, murmurs, rubs, gallops or clicks. No pedal edema. Gastrointestinal system: Abdomen is nondistended, soft and nontender. No organomegaly or masses felt. Normal bowel sounds heard. Central nervous system: Alert and oriented. No focal neurological deficits. Extremities: Symmetric 5 x 5 power. Skin: No rashes,  lesions or ulcers Psychiatry: Judgement and insight appear normal. Mood & affect appropriate.     Data Reviewed: I have personally reviewed following labs and imaging studies  CBC: Recent Labs  Lab 04/30/18 1458  05/02/18 0506 05/02/18 2031 05/03/18 0502 05/04/18 0805 05/04/18 0953  WBC 5.0   < > 7.2 5.5 5.0 5.9 5.7  NEUTROABS 3.6  --   --  3.2  --   --   --   HGB 14.9   < > 15.7 15.4 15.0 14.8 14.8  HCT 44.5   < > 46.2 47.0 45.9 46.1 45.7  MCV 92.8   < > 92.0 92.0 92.9 92.6 93.3  PLT 153   < > 159 187 168 158 158   < > = values in this interval not displayed.   Basic Metabolic Panel: Recent Labs  Lab 05/02/18 2031  05/03/18 0502 05/04/18 0805 05/04/18 0953 05/05/18 1016  NA 142 137 139 139 140  K 3.3* 3.8 4.3 4.6 3.9  CL 103 103 109 108 103  CO2 27 23 21* 20* 25  GLUCOSE 97 125* 118* 156* 166*  BUN 19 20 16 16 15   CREATININE 1.41* 1.61* 1.19 1.23 1.40*  CALCIUM 9.9 9.4 9.2 9.0 9.2  MG 2.2  --   --   --   --    GFR: Estimated Creatinine Clearance: 47.8 mL/min (A) (by C-G formula based on SCr of 1.4 mg/dL (H)). Liver Function Tests: Recent Labs  Lab 05/01/18 1023  AST 22  ALT 17  ALKPHOS 74  BILITOT 1.2  PROT 6.6  ALBUMIN 4.1   No results for input(s): LIPASE, AMYLASE in the last 168 hours. No results for input(s): AMMONIA in the last 168 hours. Coagulation Profile: Recent Labs  Lab 05/01/18 1920 05/03/18 0502  INR 1.02 1.10   Cardiac Enzymes: Recent Labs  Lab 04/30/18 1458 04/30/18 2109 05/01/18 0412 05/01/18 1023  TROPONINI <0.03 0.03* 0.03* <0.03   BNP (last 3 results) No results for input(s): PROBNP in the last 8760 hours. HbA1C: No results for input(s): HGBA1C in the last 72 hours. CBG: No results for input(s): GLUCAP in the last 168 hours. Lipid Profile: No results for input(s): CHOL, HDL, LDLCALC, TRIG, CHOLHDL, LDLDIRECT in the last 72 hours. Thyroid Function Tests: Recent Labs    05/02/18 2031  TSH 15.425*   Anemia  Panel: No results for input(s): VITAMINB12, FOLATE, FERRITIN, TIBC, IRON, RETICCTPCT in the last 72 hours. Sepsis Labs: No results for input(s): PROCALCITON, LATICACIDVEN in the last 168 hours.  Recent Results (from the past 240 hour(s))  Surgical pcr screen     Status: None   Collection Time: 05/02/18  8:56 PM  Result Value Ref Range Status   MRSA, PCR NEGATIVE NEGATIVE Final   Staphylococcus aureus NEGATIVE NEGATIVE Final    Comment: (NOTE) The Xpert SA Assay (FDA approved for NASAL specimens in patients 22 years of age and older), is one component of a comprehensive surveillance program. It is not intended to diagnose infection nor to guide or monitor treatment. Performed at [facility], [place] [address], [place], [place] [address]          Radiology Studies: Ct Angio Head W Or Wo Contrast  Result Date: 05/04/2018 CLINICAL DATA:  Left-sided numbness.  Pacemaker placed yesterday. EXAM: CT ANGIOGRAPHY HEAD AND NECK TECHNIQUE: Multidetector CT imaging of the head and neck was performed using the standard protocol during bolus administration of intravenous contrast. Multiplanar CT image reconstructions and MIPs were obtained to evaluate the vascular anatomy. Carotid stenosis measurements (when applicable) are obtained utilizing NASCET criteria, using the distal internal carotid diameter as the denominator. CONTRAST:  19mL ISOVUE-370 IOPAMIDOL (ISOVUE-370) INJECTION 76% COMPARISON:  MRI head 12/06/2015 FINDINGS: CT HEAD FINDINGS Brain: Mild atrophy and mild chronic microvascular ischemia. No acute infarct. Negative for acute hemorrhage or mass Vascular: Small gas bubbles are seen over the convexity most likely within cortical veins and in the sagittal sinus. No subdural gas. No gas in the cavernous sinus. Negative for hyperdense vessel.  Atherosclerotic calcification. Skull: Negative for fracture or mass. Sinuses: Mild mucosal edema paranasal sinuses. Orbits: Bilateral cataract  surgery. Review of the MIP images confirms the above findings CTA NECK FINDINGS Aortic arch: Atherosclerotic disease in the aortic arch which is uncoiled. Negative for aneurysm or dissection Right carotid system: Right common carotid artery widely patent.  Mild atherosclerotic calcification right internal carotid artery without significant stenosis. Left carotid system: Left common carotid artery widely patent. Mild atherosclerotic calcification left carotid bifurcation without significant stenosis. Vertebral arteries: Atherosclerotic disease at the origin of the vertebral arteries bilaterally with mild stenosis on the left. No significant right vertebral artery stenosis. No other additional vertebral stenosis. Skeleton: Negative Other neck: Negative for mass or adenopathy. Upper chest: Extensive apical emphysema. Left pacemaker placement with gas in the surrounding soft tissues. Review of the MIP images confirms the above findings CTA HEAD FINDINGS Anterior circulation: Atherosclerotic calcification in the cavernous carotid bilaterally with mild stenosis bilaterally. Anterior and middle cerebral arteries patent bilaterally without significant stenosis. Anterior and middle cerebral arteries are patent bilaterally without occlusion. Scattered atherosclerotic disease in the anterior cerebral arteries bilaterally and in middle cerebral artery branches bilaterally. Posterior circulation: Atherosclerotic disease in the distal vertebral artery bilaterally is mild without stenosis significant stenosis. Mild atherosclerotic disease in the basilar which is tortuous but without significant stenosis. PICA patent bilaterally. Superior cerebellar and posterior cerebral arteries patent bilaterally. Venous sinuses: No evidence of dural sinus thrombosis. Gas in cortical veins in superior sagittal sinus over the convexity is mild. Anatomic variants: None Delayed phase: Normal enhancement postcontrast infusion Review of the MIP images  confirms the above findings IMPRESSION: 1. Atrophy and chronic microvascular ischemia. No acute intracranial abnormality. 2. Mild atherosclerotic disease in the carotid and vertebral arteries without significant stenosis in the neck. Mild intracranial atherosclerotic disease. No emergent large vessel occlusion 3. Small gas bubbles in cortical veins over the convexity and in the superior sagittal sinus. Most likely introduced at vena puncture or pacemaker placement yesterday. Electronically Signed   By: Franchot Gallo M.D.   On: 05/04/2018 14:21   Dg Chest 2 View  Result Date: 05/04/2018 CLINICAL DATA:  AICD. EXAM: CHEST - 2 VIEW COMPARISON:  04/30/2018.  CT 03/07/2018. FINDINGS: AICD noted in good anatomic position. Cardiac valve replacement. Left atrial appendage clip noted. Cardiomegaly with normal pulmonary vascularity. Interim partial clearing of pulmonary interstitial edema. No pleural effusion or pneumothorax. No acute bony abnormality. IMPRESSION: 1.  AICD noted with lead tips in good anatomic position. 2. Prior cardiac valve replacement. Left atrial appendage clip noted. Persistent cardiomegaly. Interim partial clearing of pulmonary interstitial edema. Electronically Signed   By: Marcello Moores  Register   On: 05/04/2018 10:13   Ct Angio Neck W Or Wo Contrast  Result Date: 05/04/2018 CLINICAL DATA:  Left-sided numbness.  Pacemaker placed yesterday. EXAM: CT ANGIOGRAPHY HEAD AND NECK TECHNIQUE: Multidetector CT imaging of the head and neck was performed using the standard protocol during bolus administration of intravenous contrast. Multiplanar CT image reconstructions and MIPs were obtained to evaluate the vascular anatomy. Carotid stenosis measurements (when applicable) are obtained utilizing NASCET criteria, using the distal internal carotid diameter as the denominator. CONTRAST:  129mL ISOVUE-370 IOPAMIDOL (ISOVUE-370) INJECTION 76% COMPARISON:  MRI head 12/06/2015 FINDINGS: CT HEAD FINDINGS Brain: Mild  atrophy and mild chronic microvascular ischemia. No acute infarct. Negative for acute hemorrhage or mass Vascular: Small gas bubbles are seen over the convexity most likely within cortical veins and in the sagittal sinus. No subdural gas. No gas in the cavernous sinus. Negative for hyperdense vessel.  Atherosclerotic calcification. Skull: Negative for fracture or mass. Sinuses: Mild mucosal edema paranasal sinuses. Orbits: Bilateral cataract surgery. Review of the MIP images confirms the above findings CTA NECK FINDINGS Aortic arch: Atherosclerotic disease in the aortic arch which is uncoiled. Negative for aneurysm or dissection Right carotid  system: Right common carotid artery widely patent. Mild atherosclerotic calcification right internal carotid artery without significant stenosis. Left carotid system: Left common carotid artery widely patent. Mild atherosclerotic calcification left carotid bifurcation without significant stenosis. Vertebral arteries: Atherosclerotic disease at the origin of the vertebral arteries bilaterally with mild stenosis on the left. No significant right vertebral artery stenosis. No other additional vertebral stenosis. Skeleton: Negative Other neck: Negative for mass or adenopathy. Upper chest: Extensive apical emphysema. Left pacemaker placement with gas in the surrounding soft tissues. Review of the MIP images confirms the above findings CTA HEAD FINDINGS Anterior circulation: Atherosclerotic calcification in the cavernous carotid bilaterally with mild stenosis bilaterally. Anterior and middle cerebral arteries patent bilaterally without significant stenosis. Anterior and middle cerebral arteries are patent bilaterally without occlusion. Scattered atherosclerotic disease in the anterior cerebral arteries bilaterally and in middle cerebral artery branches bilaterally. Posterior circulation: Atherosclerotic disease in the distal vertebral artery bilaterally is mild without stenosis  significant stenosis. Mild atherosclerotic disease in the basilar which is tortuous but without significant stenosis. PICA patent bilaterally. Superior cerebellar and posterior cerebral arteries patent bilaterally. Venous sinuses: No evidence of dural sinus thrombosis. Gas in cortical veins in superior sagittal sinus over the convexity is mild. Anatomic variants: None Delayed phase: Normal enhancement postcontrast infusion Review of the MIP images confirms the above findings IMPRESSION: 1. Atrophy and chronic microvascular ischemia. No acute intracranial abnormality. 2. Mild atherosclerotic disease in the carotid and vertebral arteries without significant stenosis in the neck. Mild intracranial atherosclerotic disease. No emergent large vessel occlusion 3. Small gas bubbles in cortical veins over the convexity and in the superior sagittal sinus. Most likely introduced at vena puncture or pacemaker placement yesterday. Electronically Signed   By: Franchot Gallo M.D.   On: 05/04/2018 14:21   Ct Angio Chest Pe W Or Wo Contrast  Result Date: 05/04/2018 CLINICAL DATA:  Shortness of breath.  Recent pacemaker placement EXAM: CT ANGIOGRAPHY CHEST WITH CONTRAST TECHNIQUE: Multidetector CT imaging of the chest was performed using the standard protocol during bolus administration of intravenous contrast. Multiplanar CT image reconstructions and MIPs were obtained to evaluate the vascular anatomy. CONTRAST:  156mL ISOVUE-370 IOPAMIDOL (ISOVUE-370) INJECTION 76% COMPARISON:  Chest CT March 07, 2018; chest radiograph May 04, 2018. Chest CT angiogram August 31, 2017 FINDINGS: Cardiovascular: There is no demonstrable pulmonary embolus. The ascending thoracic aorta has a maximum transverse diameter of 4.5 x 4.5 cm. No dissection is seen. It should be noted that the contrast bolus is not sufficient to reliably exclude dissection as a differential consideration radiographically on this study. There is mild calcification in  proximal visualized great vessels. There is aortic atherosclerosis as well as foci of coronary artery calcification. Pacemaker leads are attached to the right ventricle and coronary sinus. There is an apparent left atrial appendage clip. There are foci of calcification in the mitral valve region. There is no appreciable pericardial effusion or pericardial thickening. Mediastinum/Nodes: Thyroid appears unremarkable. There are multiple subcentimeter mediastinal lymph nodes. There is a right pretracheal lymph node measuring 2.1 x 1.7 cm, present on prior study. There is an aortopulmonary window lymph node measuring 1.4 x 1.1 cm. There is a lymph node in the right hilar region measuring 1.7 x 1.3 cm. There is a subcarinal lymph node measuring 1.7 x 1.4 cm. No esophageal lesions are evident. Lungs/Pleura: There is underlying centrilobular and paraseptal emphysematous change with multiple small bullae throughout the lungs. There are areas of lower lobe atelectatic change. There is no frank  edema or consolidation. There is a small right pleural effusion. Upper Abdomen: There is reflux of contrast into the inferior vena cava and hepatic veins. There are stable apparent cysts in the liver. There is aortic atherosclerotic calcification in the upper abdominal aorta. There is incomplete visualization of a staghorn calculus in the upper pole left kidney. Musculoskeletal: There is thoracic dextroscoliosis. There are no blastic or lytic bone lesions. There is mild air surrounding the pacemaker device in the upper left chest wall, consistent with the recent pacemaker placement. Review of the MIP images confirms the above findings. IMPRESSION: 1.  No demonstrable pulmonary embolus. 2. Ascending thoracic aorta measures 4.5 x 4.5 cm in transverse diameter. Ascending thoracic aortic aneurysm. Recommend semi-annual imaging followup by CTA or MRA and referral to cardiothoracic surgery if not already obtained. This recommendation follows  2010 ACCF/AHA/AATS/ACR/ASA/SCA/SCAI/SIR/STS/SVM Guidelines for the Diagnosis and Management of Patients With Thoracic Aortic Disease. Circulation. 2010; 121: K938-H829. No dissection seen. Note that the contrast bolus in the aorta is insufficient to exclude dissection reliably. There is aortic atherosclerosis. 3. Pacemaker leads attached to right ventricle and coronary sinus. Foci of calcification in coronary arteries and in the mitral valve region. Left atrial appendage region clip. No pericardial effusion or thickening evident. 4. Underlying emphysematous change. Fairly small right pleural effusion. No lung edema or consolidation. 5.  Areas of adenopathy of uncertain etiology. 6. Reflux of contrast into the inferior vena cava and hepatic veins may be indicative of a degree of increase in right heart pressure. 7. Incomplete visualization of staghorn calculus upper pole left kidney. Aortic Atherosclerosis (ICD10-I70.0) and Emphysema (ICD10-J43.9). Aortic aneurysm NOS (ICD10-I71.9). Electronically Signed   By: Lowella Grip III M.D.   On: 05/04/2018 14:17        Scheduled Meds: . aspirin EC  81 mg Oral Daily  . atorvastatin  40 mg Oral q1800  . carvedilol  6.25 mg Oral BID WC  . docusate sodium  100 mg Oral BID  . fluticasone  2 spray Each Nare Daily  . fluticasone furoate-vilanterol  1 puff Inhalation Daily  . furosemide  80 mg Intravenous BID  . furosemide  40 mg Oral Daily  . multivitamin with minerals  1 tablet Oral Daily  . pantoprazole  40 mg Oral Daily  . potassium chloride  40 mEq Oral TID  . sodium chloride flush  3 mL Intravenous Q12H   Continuous Infusions: . sodium chloride       LOS: 3 days     Georgette Shell, MD [facility]  If 7PM-7AM, please contact night-coverage [email or web address] Password [email or web address] 05/05/2018, 12:53 PM

## 2018-05-05 NOTE — Discharge Summary (Addendum)
ELECTROPHYSIOLOGY PROCEDURE DISCHARGE SUMMARY    Patient ID: Michael Delacruz,  MRN: [id number], DOB/AGE: 1941/11/16 77 y.o.  Admit date: 05/02/2018 Discharge date: 05/05/2018  Primary Care Physician: Michael Hauser, DO Primary Cardiologist: Michael Delacruz Electrophysiologist: Michael Delacruz  Primary Discharge Diagnosis:  Atrial flutter with RVR and chronic systolic heart failure s/p CRTP implant and AVN ablation this admission  Secondary Discharge Diagnosis: 1.  NICM 2.  S/p MAZE with clipping of LAA 3.  Pulmonary hypertension 4.  Prior retinal bleed on Warfarin 5.  TIA 6.  COPD 7.  HTN 8.  Fibromyalgia 9.  GERD 10. delerium  Allergies  Allergen Reactions  . Codeine Nausea Only  . Digoxin And Related     SOB, bad dreams  . Macrodantin [Nitrofurantoin Macrocrystal] Rash  . Morphine And Related Rash    Procedures This Admission:  1.  Implantation of a STJ CRTP on 05/03/18 by Michael Delacruz. See op note for full details. There were no early apparent complications 2.  AVN ablation on 05/04/18 by Michael Michael Delacruz.  See op note for full details. There were no early apparent complications.   Brief HPI/Hospital Course:  [name] underwent TTE in 07/2015 that showed an EF of 55-60% with severe MR secondary to posterior leaflet prolapse. LHC in 09/2015 that showed severe MR with 30% proximal and mid LAD stenosis as well as 50% mid LCx stenosis, EF 50%. He underwentMAZE with mitral valve repair and left atrial appendage clippingin 10/2015. Follow up echo in 11/2015 showed an EF of 55-60%, mild aortic root dilatation with mild MR. Echo in 07/2017 showed an EF of 45-50%. Holter in 08/2017 showed a mean heart rate of 123 bpm with a range of 94-135 bpm. He has been followed by EP given his poorly controlled tachycardic rates and has declined AV junction ablation with pacing in the past. He has previously been on warfarin/Eliquis though has self discontinued both due to retinal bleed and has been adverse  to [facility] since. Multiple efforts have been undertaken to control his rates medically due to continued tachycardia. He was previously on amiodarone, though he self discontinued this medication due to nightmares. Most recent Holter monitor from 11/2017 showed a mean heart rate of 112 bpm with a range of 94-135 bpm, with 93% of all beats beaing >100 bpm. At his last follow up in 01/2018, he was noted to only want to take Lasix and nothing else. He was noted to be in atrial flutter with RVR with a rate of 118 bpm. It was recommended he be treated for bronchitis at that time.   Has noted an intermittent worsening SOB over the past 2 months that became suddenly worse on 5/12 prompting him to come in to the ED. He has been noncompliant with his medications, taking Lasix 40 mg 3-4 times weekly, not taking any medications for rate control, refusing anticoagulation. Upon his admission to [facility], he was noted to be in Afib/flutter with RVR with heart rates into the 130s bpm. Troponin peaked at 0.03 x 2, BNP 527, K+ 3.5 s/p PO repletion (current K+ pending), WBC 5.0, HGB 14.9. He was placed on IV Lasix, metoprolol, ASA, and nebulizer. Upon cardiology seeing the patient, he was started on a heparin gtt, which he has tolerated without issues to date.He has been seen by EP with recommendations to transfer to [facility] for AV node ablation and pacing, to be completed on 5/15. Documented UOP of 1.4 L for the admission to date.  He was transferred to [facility] and underwent CRTP implant and AVN ablation. There was concern from his family about increased confusion and hallucinations. He was seen by internal medicine who obtained CT scan of head and chest without acute abnormalities.  He was monitored on telemetry overnight which demonstrated V pacing at 90.  CXR showed stable lead position.  Device interrogation was normal.  He was examined by Michael Michael Delacruz and considered stable for discharge to home.  He will need close outpatient follow  up with both heart failure and EP (appointments have been scheduled).  He will need BMET at wound check appointment and lowering of his pacing rate to 80.    CHADS2VASC warrants [place]; however, he has had prior retinal bleed and is also s/p LAA clipping. Will defer long term anticoagulation decision to Michael Delacruz. Coreg dose decreased this admission 2/2 hypotension.    Physical Exam: Vitals:   05/05/18 0500 05/05/18 0627 05/05/18 0807 05/05/18 1226  BP: 107/87   101/77  Pulse: 94   90  Resp: 18   20  Temp: 97.7 F (36.5 C)   (!) 97.5 F (36.4 C)  TempSrc: Oral   Oral  SpO2: 94%  96% 95%  Weight:  185 lb 8 oz (84.1 kg)    Height:        GEN- The patient is chronically ill appearing, alert and oriented x 3 today.   HEENT: normocephalic, atraumatic; sclera clear, conjunctiva pink; hearing intact; oropharynx clear; neck supple  Lungs- Clear to ausculation bilaterally, normal work of breathing.  No wheezes, rales, rhonchi Heart- Regular rate and rhythm (paced) GI- soft, non-tender, non-distended, bowel sounds present  Extremities- no clubbing, cyanosis, or edema MS- no significant deformity or atrophy Skin- warm and dry, no rash or lesion Psych- euthymic mood, full affect Neuro- strength and sensation are intact    Labs:   Lab Results  Component Value Date   WBC 5.7 05/04/2018   HGB 14.8 05/04/2018   HCT 45.7 05/04/2018   MCV 93.3 05/04/2018   PLT 158 05/04/2018    Recent Labs  Lab 05/01/18 1023  05/05/18 1016  NA 136   < > 140  K 3.4*   < > 3.9  CL 106   < > 103  CO2 23   < > 25  BUN 12   < > 15  CREATININE 1.10   < > 1.40*  CALCIUM 8.6*   < > 9.2  PROT 6.6  --   --   BILITOT 1.2  --   --   ALKPHOS 74  --   --   ALT 17  --   --   AST 22  --   --   GLUCOSE 150*   < > 166*   < > = values in this interval not displayed.     Discharge Medications:  Allergies as of 05/05/2018      Reactions   Codeine Nausea Only   Digoxin And Related    SOB, bad dreams    Macrodantin [nitrofurantoin Macrocrystal] Rash   Morphine And Related Rash      Medication List    STOP taking these medications   heparin 100-0.45 UNIT/ML-% infusion     TAKE these medications   albuterol 108 (90 Base) MCG/ACT inhaler Commonly known as:  PROVENTIL HFA;VENTOLIN HFA Inhale 2 puffs into the lungs every 6 (six) hours as needed for wheezing or shortness of breath.   ALPRAZolam 0.25 MG tablet Commonly known as:  XANAX Take 1 tablet (0.25 mg total) by mouth 2 (two) times daily as needed for anxiety or sleep.   aspirin 81 MG EC tablet Take 1 tablet (81 mg total) by mouth daily.   atorvastatin 40 MG tablet Commonly known as:  LIPITOR Take 1 tablet (40 mg total) by mouth daily at 6 PM.   carvedilol 6.25 MG tablet Commonly known as:  COREG Take 1 tablet (6.25 mg total) by mouth 2 (two) times daily with a meal. What changed:    medication strength  how much to take   fluticasone furoate-vilanterol 100-25 MCG/INH Aepb Commonly known as:  BREO ELLIPTA Inhale 1 puff into the lungs daily.   furosemide 40 MG tablet Commonly known as:  LASIX Take 1 tablet (40 mg total) by mouth 2 (two) times daily as needed.   MULTI-VITAMINS Tabs Take 1 tablet by mouth daily.   pantoprazole 40 MG tablet Commonly known as:  PROTONIX Take 1 tablet (40 mg total) by mouth daily.   polyethylene glycol powder powder Commonly known as:  GLYCOLAX/MIRALAX Take 17 g by mouth daily as needed for mild constipation or moderate constipation. Start 1 cap for few days, if need increase to 2 caps       Disposition: Pt is being discharged home today in good condition. Discharge Instructions    Diet - low sodium heart healthy   Complete by:  As directed    Increase activity slowly   Complete by:  As directed      Follow-up Information    [place] Office Follow up on 05/16/2018.   Specialty:  Cardiology Why:  at [facility] information: [address] [place]       [place] Follow up on 05/18/2018.   Specialty:  Cardiology Why:  at 11AM Contact information: [address] [place] [phone]       Deboraha Sprang, MD Follow up on 05/30/2018.   Specialty:  Cardiology Why:  at 10:15AM Contact information: [place] [address] [phone]           Duration of Discharge Encounter: Greater than 30 minutes including physician time.  Signed, Chanetta Marshall, NP 05/05/2018 1:54 PM  I have seen, examined the patient, and reviewed the above assessment and plan.  Changes to above are made where necessary.  On exam, alert.  NAD.  Volume status is improved. RRR (Paced) DC to home with close outpatient follow-up.  Will need ventricular pacing rate adjusted incrementally over the next few weeks per AV nodal ablation protocol as directed by Michael Delacruz. Pt is clear in his decision to be discharged at this time.  His family feels that patient is near baseline and that he is ready for discharge.  Co Sign: Thompson Grayer, MD 05/05/2018 9:25 PM

## 2018-05-05 NOTE — Progress Notes (Signed)
Patient ready for discharge from unit to home. Wife will be providing transportation for patient. All personal belongings with patient.  Discharge instructions reviewed with patient as well as follow up appts and medications.  Pt offers no c/o discomfort and displays no s/s/x of distress.

## 2018-05-16 ENCOUNTER — Ambulatory Visit (INDEPENDENT_AMBULATORY_CARE_PROVIDER_SITE_OTHER): Payer: Medicare HMO | Admitting: *Deleted

## 2018-05-16 DIAGNOSIS — I482 Chronic atrial fibrillation, unspecified: Secondary | ICD-10-CM

## 2018-05-16 DIAGNOSIS — I5043 Acute on chronic combined systolic (congestive) and diastolic (congestive) heart failure: Secondary | ICD-10-CM | POA: Diagnosis not present

## 2018-05-16 DIAGNOSIS — Z950 Presence of cardiac pacemaker: Secondary | ICD-10-CM

## 2018-05-16 LAB — CUP PACEART INCLINIC DEVICE CHECK
Battery Remaining Longevity: 52 mo
Battery Voltage: 3.08 V
Brady Statistic RA Percent Paced: 0 %
Date Time Interrogation Session: 20190528134843
Implantable Lead Implant Date: 20190515
Implantable Lead Implant Date: 20190515
Implantable Lead Location: 753858
Implantable Lead Location: 753860
Implantable Lead Model: 5076
Implantable Pulse Generator Implant Date: 20190515
Lead Channel Impedance Value: 587.5 Ohm
Lead Channel Impedance Value: 962.5 Ohm
Lead Channel Pacing Threshold Amplitude: 0.5 V
Lead Channel Pacing Threshold Amplitude: 1.25 V
Lead Channel Pacing Threshold Pulse Width: 0.5 ms
Lead Channel Pacing Threshold Pulse Width: 0.5 ms
Lead Channel Sensing Intrinsic Amplitude: 4.9 mV
Lead Channel Setting Pacing Amplitude: 3.5 V
Lead Channel Setting Pacing Amplitude: 3.5 V
Lead Channel Setting Pacing Pulse Width: 0.5 ms
Lead Channel Setting Pacing Pulse Width: 0.5 ms
Lead Channel Setting Sensing Sensitivity: 2 mV
Pulse Gen Serial Number: 9427031

## 2018-05-16 NOTE — Progress Notes (Signed)
Wound check appointment. Dermabond partially removed. Wound without redness or edema. Incision edges approximated, wound healing well. Patient given adhesive remover wipes to remove remaining Dermabond next week. Normal device function. Thresholds, sensing, and impedances consistent with implant measurements. Base rate reduced to 80bpm per Dr. Caryl Comes s/p AVN ablation on 05/04/18. Device programmed at 3.5V (RV output reduced to 3.5V from 5.0V per Dr. Caryl Comes) for extra safety margin until 3 month visit. BiV pacing 99%. Sensor remains on, sensor threshold reprogrammed from auto (-0.5) to auto (+0.0) as sensor was still activated at rest on presentation. Permanent AF, patient declines [place] per notes. No high ventricular rates noted. Patient educated about wound care, arm mobility, lifting restrictions, and Merlin monitor. Merlin monitor updated today. ROV with SK/B on 05/30/18.  Patient reports ongoing ShOB since hospital d/c, reports he continues to smoke. Sore throat noted for a few days, afebrile per patient, recently visited daughter, who has had cold-like symptoms. Reports ongoing leg weakness. Patient intermittently noted sensation of odd heartbeats during appointment (? PVCs). Daily weights stable at home. BMET ordered today per instructions from Chanetta Marshall, NP. Encouraged patient to contact PCP for recommendations regarding sore throat. Plan for f/u with Darylene Price, NP, on 05/18/18, and f/u with Dr. Caryl Comes on 05/30/18. Patient agrees to call our office if symptoms worsen prior to these appointments.  Routed to Dr. Caryl Comes for review.

## 2018-05-17 LAB — BASIC METABOLIC PANEL
BUN/Creatinine Ratio: 10 (ref 10–24)
BUN: 10 mg/dL (ref 8–27)
CO2: 24 mmol/L (ref 20–29)
Calcium: 9.2 mg/dL (ref 8.6–10.2)
Chloride: 102 mmol/L (ref 96–106)
Creatinine, Ser: 0.97 mg/dL (ref 0.76–1.27)
GFR calc Af Amer: 88 mL/min/{1.73_m2} (ref >59)
GFR calc non Af Amer: 76 mL/min/{1.73_m2} (ref >59)
Glucose: 92 mg/dL (ref 65–99)
Potassium: 3.9 mmol/L (ref 3.5–5.2)
Sodium: 142 mmol/L (ref 134–144)

## 2018-05-18 ENCOUNTER — Ambulatory Visit: Payer: Medicare HMO | Admitting: Family

## 2018-05-30 ENCOUNTER — Encounter: Payer: Self-pay | Admitting: Internal Medicine

## 2018-05-30 ENCOUNTER — Ambulatory Visit (INDEPENDENT_AMBULATORY_CARE_PROVIDER_SITE_OTHER): Payer: Medicare HMO | Admitting: Internal Medicine

## 2018-05-30 VITALS — BP 106/58 | HR 80 | Ht 70.0 in | Wt 194.0 lb

## 2018-05-30 DIAGNOSIS — I428 Other cardiomyopathies: Secondary | ICD-10-CM

## 2018-05-30 DIAGNOSIS — Z950 Presence of cardiac pacemaker: Secondary | ICD-10-CM

## 2018-05-30 DIAGNOSIS — I4892 Unspecified atrial flutter: Secondary | ICD-10-CM | POA: Diagnosis not present

## 2018-05-30 NOTE — Patient Instructions (Addendum)
Medication Instructions: - Your physician recommends that you continue on your current medications as directed. Please refer to the Current Medication list given to you today.  Labwork: - none ordered  Procedures/Testing: - Your physician has requested that you have an echocardiogram- 2 months. Echocardiography is a painless test that uses sound waves to create images of your heart. It provides your doctor with information about the size and shape of your heart and how well your heart's chambers and valves are working. This procedure takes approximately one hour. There are no restrictions for this procedure.  Follow-Up: - Your physician recommends that you schedule a follow-up appointment in: 2 months with Dr. Caryl Comes (just after the echo)   Any Additional Special Instructions Will Be Listed Below (If Applicable).     If you need a refill on your cardiac medications before your next appointment, please call your pharmacy.

## 2018-05-30 NOTE — Progress Notes (Signed)
Patient Care Team: [name], DO as PCP - General (Family Medicine) [name], MD as Consulting Physician (Cardiology)   HPI  Michael Delacruz is a 77 y.o. male seen in follow-up for persistent/permanent atrial fibrillation/flutter with a rapid ventricular response and interval deterioration of LV systolic function   DATE TEST EF   8/16    Echo  55-60 % Severe MR 2/2 post leaflet prolapse  12/16    Echo  55-60 % Mild Aortic root dilitation MR mild  8/18 Echo  45-50%   5/19 Echo  20-25%      He has been disinclined because of his eyes to undergo anticoagulation and cardioversion even for a short period of time .Hence, we elected to use amiodarone as a class IIb indication for rate control as a potential alternative to AV junction ablation and pacing   intercurrently  amio has been discontinued because of nightmares.    DATE TEST   9/18 Holter      Mean 123 (94-135)  12/18 Holter Mean HR 112 (94-135)          Date Cr K TSH ALT   10/18 1.23 3.4 5.31(8/18)  12  10/18 1..44 3.9 9.9>>8.7   5/19 0.97 3.9       5/19 he presented with acute systolic heart failure with symptoms of impending doom.  He was in atrial flutter with uncontrolled rate.  He was at that juncture willing and underwent CRT-P and AV junction ablation.  I did his CRT-P Dr. Greggory Brandy did his AV ablation  He is significantly improved.  He has mild but much less shortness of breath.  He has no edema.  No lightheadedness.  Device is well-healed.    Past Medical History:  Diagnosis Date  . Arthritis   . Asthma   . Bradycardia   . CAD in native artery    a. LHC 09/2015: 40% pCx, 35% mRCA.  [name] Chronic diastolic congestive heart failure ([place])   . COPD (chronic obstructive pulmonary disease) ([place])   . Depression   . Dilation of intestine 01/2015  . Fibromyalgia   . Frequent headaches   . GERD (gastroesophageal reflux disease)   . History of blood clots    eye   . History of  hiatal hernia   . History of rheumatic fever   . Hypertension   . Kidney stone   . PAF (paroxysmal atrial fibrillation) ([place])    a. s/p TEE/DCCV 07/2015. b. H/o bleeding on Coumadin when INR >5, changed to Eliquis.  . S/P Minimally invasive maze operation for atrial fibrillation 10/22/2015   Complete bilateral atrial lesion set using cryothermy and bipolar radiofrequency ablation with clipping of LA appendage via right mini thoracotomy approach  . S/P minimally invasive mitral valve repair 10/22/2015   Complex valvuloplasty including triangular resection of posterior leaflet, artificial Gore-tex neochord placement x6 and 38 mm Sorin Memo 3D Rechord ring annuloplasty via right minithoracotomy approach  . Severe mitral regurgitation    a. s/p MV repair 10/2015.  [name] Sleep apnea   . Stroke ([place])   . Thyroid disorder   . TIA (transient ischemic attack)   . Tobacco abuse     Past Surgical History:  Procedure Laterality Date  . ANKLE SURGERY    . AV NODE ABLATION N/A 05/04/2018   Procedure: AV NODE ABLATION;  Surgeon: Thompson Grayer, MD;  Location: [place] [facility];  Service: Cardiovascular;  Laterality: N/A;  . BIV  PACEMAKER INSERTION CRT-P N/A 05/03/2018   Procedure: BIV PACEMAKER INSERTION CRT-P;  Surgeon: Deboraha Sprang, MD;  Location: [place] [facility];  Service: Cardiovascular;  Laterality: N/A;  . CARDIAC CATHETERIZATION N/A 10/03/2015   Procedure: Right and Left Heart Cath and Coronary Angiography;  Surgeon: [name], MD;  Location: [facility];  Service: Cardiovascular;  Laterality: N/A;  . COLONOSCOPY    . ELECTROPHYSIOLOGIC STUDY N/A 08/18/2015   Procedure: CARDIOVERSION;  Surgeon: Wellington Hampshire, MD;  Location: [facility];  Service: Cardiovascular;  Laterality: N/A;  . MINIMALLY INVASIVE MAZE PROCEDURE N/A 10/22/2015   Procedure: MINIMALLY INVASIVE MAZE PROCEDURE;  Surgeon: Rexene Alberts, MD;  Location: [place];  Service: Open Heart Surgery;  Laterality: N/A;  .  MITRAL VALVE REPAIR Right 10/22/2015   Procedure: MINIMALLY INVASIVE MITRAL VALVE REPAIR (MVR);  Surgeon: Rexene Alberts, MD;  Location: [place] [facility];  Service: Open Heart Surgery;  Laterality: Right;  . SINUS EXPLORATION    . TEE WITHOUT CARDIOVERSION N/A 08/18/2015   Procedure: TRANSESOPHAGEAL ECHOCARDIOGRAM (TEE);  Surgeon: Wellington Hampshire, MD;  Location: [facility];  Service: Cardiovascular;  Laterality: N/A;  . TEE WITHOUT CARDIOVERSION N/A 10/22/2015   Procedure: TRANSESOPHAGEAL ECHOCARDIOGRAM (TEE);  Surgeon: Rexene Alberts, MD;  Location: [place];  Service: Open Heart Surgery;  Laterality: N/A;    Current Outpatient Medications  Medication Sig Dispense Refill  . albuterol (PROVENTIL HFA;VENTOLIN HFA) 108 (90 Base) MCG/ACT inhaler Inhale 2 puffs into the lungs every 6 (six) hours as needed for wheezing or shortness of breath. 1 Inhaler 2  . ALPRAZolam (XANAX) 0.25 MG tablet Take 1 tablet (0.25 mg total) by mouth 2 (two) times daily as needed for anxiety or sleep. 60 tablet 0  . carvedilol (COREG) 6.25 MG tablet Take 1 tablet (6.25 mg total) by mouth 2 (two) times daily with a meal. 60 tablet 1  . fluticasone furoate-vilanterol (BREO ELLIPTA) 100-25 MCG/INH AEPB Inhale 1 puff into the lungs daily. 28 each 5  . furosemide (LASIX) 40 MG tablet Take 1 tablet (40 mg total) by mouth 2 (two) times daily as needed. (Patient taking differently: Take 40 mg by mouth daily. ) 60 tablet 6  . polyethylene glycol powder (GLYCOLAX/MIRALAX) powder Take 17 g by mouth daily as needed for mild constipation or moderate constipation. Start 1 cap for few days, if need increase to 2 caps 250 g 1   No current facility-administered medications for this visit.     Allergies  Allergen Reactions  . Codeine Nausea Only  . Digoxin And Related     SOB, bad dreams  . Macrodantin [Nitrofurantoin Macrocrystal] Rash  . Morphine And Related Rash      Review of Systems negative except from HPI and PMH  Physical Exam BP (!)  106/58 (BP Location: Left Arm, Patient Position: Sitting, Cuff Size: Normal)   Pulse 80   Ht 5\' 10"  (1.778 m)   Wt 194 lb (88 kg)   BMI 27.84 kg/m  Well developed and nourished in no acute distress HENT normal Neck supple with JVP-flat Clear Device pocket well healed; without hematoma or erythema.  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  He is euvolemic.  We will anticipate reassessment of LV function in about 8 weeks. . We spent more than 50% of our >25 min visit in face to face counseling regarding the above

## 2018-06-07 ENCOUNTER — Telehealth: Payer: Self-pay | Admitting: Internal Medicine

## 2018-06-07 NOTE — Telephone Encounter (Signed)
To Device clinic to review.  

## 2018-06-07 NOTE — Telephone Encounter (Signed)
Patient has been at 70 beats per minute  Since then he has been very tired and fatigued  Patient would like to know if Dr Caryl Comes feels ok with him being moved up to 75 beats per minute Please call to discuss

## 2018-06-08 ENCOUNTER — Ambulatory Visit (INDEPENDENT_AMBULATORY_CARE_PROVIDER_SITE_OTHER): Payer: Self-pay | Admitting: *Deleted

## 2018-06-08 DIAGNOSIS — Z950 Presence of cardiac pacemaker: Secondary | ICD-10-CM

## 2018-06-08 DIAGNOSIS — I482 Chronic atrial fibrillation, unspecified: Secondary | ICD-10-CM

## 2018-06-08 DIAGNOSIS — I428 Other cardiomyopathies: Secondary | ICD-10-CM

## 2018-06-08 LAB — CUP PACEART INCLINIC DEVICE CHECK
Battery Remaining Longevity: 63 mo
Battery Voltage: 3.08 V
Brady Statistic RA Percent Paced: 0 %
Brady Statistic RV Percent Paced: 86 %
Date Time Interrogation Session: 20190620153525
Implantable Lead Implant Date: 20190515
Implantable Lead Implant Date: 20190515
Implantable Lead Location: 753858
Implantable Lead Location: 753860
Implantable Lead Model: 5076
Implantable Pulse Generator Implant Date: 20190515
Lead Channel Impedance Value: 537.5 Ohm
Lead Channel Impedance Value: 912.5 Ohm
Lead Channel Sensing Intrinsic Amplitude: 7.9 mV
Lead Channel Setting Pacing Amplitude: 3.5 V
Lead Channel Setting Pacing Amplitude: 3.5 V
Lead Channel Setting Pacing Pulse Width: 0.5 ms
Lead Channel Setting Pacing Pulse Width: 0.5 ms
Lead Channel Setting Sensing Sensitivity: 2 mV
Pulse Gen Serial Number: 9427031

## 2018-06-08 NOTE — Progress Notes (Signed)
CRTP check in clinic due to fatigue since LR change 05/30/18. 86% Biv pacing (86% on 05/16/18). RR threshold changed from +0.0 to +0.5. Lower rate increased from 70 to 80bpm. Vs events suppressed at 80 for about 2 minutes before returning. Will review with SK when he is in office. Follow up as scheduled with SK/B in 2 months.

## 2018-06-08 NOTE — Telephone Encounter (Signed)
Instructed Mr and Mrs Cecchi to send a transmission with home monitor to evaluate HR histograms.

## 2018-06-08 NOTE — Telephone Encounter (Signed)
Michael Delacruz unable to send remote transmission as device is in RF timeout. He will have to come to the office to have pacer function evaluated. Scheduled for 3pm, patient is agreeable.

## 2018-06-14 LAB — CUP PACEART INCLINIC DEVICE CHECK
Battery Remaining Longevity: 58 mo
Battery Voltage: 3.08 V
Brady Statistic RA Percent Paced: 0 %
Brady Statistic RV Percent Paced: 89 %
Date Time Interrogation Session: 20190611153818
Implantable Lead Implant Date: 20190515
Implantable Lead Implant Date: 20190515
Implantable Lead Location: 753858
Implantable Lead Location: 753860
Implantable Lead Model: 5076
Implantable Pulse Generator Implant Date: 20190515
Lead Channel Impedance Value: 550 Ohm
Lead Channel Impedance Value: 962.5 Ohm
Lead Channel Sensing Intrinsic Amplitude: 7.5 mV
Lead Channel Setting Pacing Amplitude: 3.5 V
Lead Channel Setting Pacing Amplitude: 3.5 V
Lead Channel Setting Pacing Pulse Width: 0.5 ms
Lead Channel Setting Pacing Pulse Width: 0.5 ms
Lead Channel Setting Sensing Sensitivity: 2 mV
Pulse Gen Serial Number: 9427031

## 2018-07-10 ENCOUNTER — Other Ambulatory Visit: Payer: Self-pay | Admitting: Internal Medicine

## 2018-07-10 ENCOUNTER — Telehealth: Payer: Self-pay | Admitting: Family Medicine

## 2018-07-10 DIAGNOSIS — G4701 Insomnia due to medical condition: Secondary | ICD-10-CM

## 2018-07-10 MED ORDER — CARVEDILOL 6.25 MG PO TABS: 6.2500 mg | ORAL_TABLET | Freq: Two times a day (BID) | ORAL | 3 refills | Status: DC

## 2018-07-10 MED ORDER — ALPRAZOLAM 0.25 MG PO TABS: 0.2500 mg | ORAL_TABLET | Freq: Two times a day (BID) | ORAL | 0 refills | Status: DC | PRN

## 2018-07-10 NOTE — Telephone Encounter (Signed)
Pt's medication was sent to pt's pharmacy as requested. Confirmation received.  °

## 2018-07-10 NOTE — Telephone Encounter (Signed)
Pt called requesting refill on  Xanax .25 called into   [facility]

## 2018-07-10 NOTE — Telephone Encounter (Signed)
[facility] e-[facility] to [facility].  Nobie Putnam, DO [facility] 07/10/2018, 1:23 PM

## 2018-07-20 ENCOUNTER — Ambulatory Visit (INDEPENDENT_AMBULATORY_CARE_PROVIDER_SITE_OTHER): Payer: Medicare HMO

## 2018-07-20 ENCOUNTER — Other Ambulatory Visit: Payer: Self-pay

## 2018-07-20 DIAGNOSIS — I428 Other cardiomyopathies: Secondary | ICD-10-CM | POA: Diagnosis not present

## 2018-08-01 ENCOUNTER — Ambulatory Visit (INDEPENDENT_AMBULATORY_CARE_PROVIDER_SITE_OTHER): Payer: Medicare HMO | Admitting: Internal Medicine

## 2018-08-01 ENCOUNTER — Encounter: Payer: Self-pay | Admitting: Internal Medicine

## 2018-08-01 VITALS — BP 124/76 | HR 81 | Ht 70.0 in | Wt 190.0 lb

## 2018-08-01 DIAGNOSIS — I5032 Chronic diastolic (congestive) heart failure: Secondary | ICD-10-CM

## 2018-08-01 DIAGNOSIS — I4892 Unspecified atrial flutter: Secondary | ICD-10-CM | POA: Diagnosis not present

## 2018-08-01 DIAGNOSIS — I481 Persistent atrial fibrillation: Secondary | ICD-10-CM

## 2018-08-01 DIAGNOSIS — Z950 Presence of cardiac pacemaker: Secondary | ICD-10-CM

## 2018-08-01 DIAGNOSIS — I428 Other cardiomyopathies: Secondary | ICD-10-CM | POA: Diagnosis not present

## 2018-08-01 DIAGNOSIS — I4819 Other persistent atrial fibrillation: Secondary | ICD-10-CM

## 2018-08-01 MED ORDER — SACUBITRIL-VALSARTAN 24-26 MG PO TABS: 1.0000 | ORAL_TABLET | Freq: Two times a day (BID) | ORAL | 6 refills | Status: DC

## 2018-08-01 MED ORDER — CARVEDILOL 6.25 MG PO TABS: 3.1250 mg | ORAL_TABLET | Freq: Two times a day (BID) | ORAL | 3 refills | Status: DC

## 2018-08-01 NOTE — Patient Instructions (Addendum)
Medication Instructions: - Your physician has recommended you make the following change in your medication:   1) DECREASE coreg (carvedilol) 6.25 mg- take 1/2 tablet (3.125 mg) by mouth TWICE daily  2) START entresto 24/26 mg- take 1 tablet by mouth twice daily -   A prescription has been sent to the pharmacy to give you a #30 day supply for free to start wtih  Labwork: - none ordered  Procedures/Testing: - none ordered  Follow-Up: - Your physician recommends that you schedule a follow-up appointment in: 3 months with Dr. Caryl Comes.   Any Additional Special Instructions Will Be Listed Below (If Applicable).     If you need a refill on your cardiac medications before your next appointment, please call your pharmacy.

## 2018-08-01 NOTE — Progress Notes (Signed)
Patient Care Team: [name], DO as PCP - General (Family Medicine) [name], MD as Consulting Physician (Cardiology)   HPI  Michael Delacruz is a 77 y.o. male seen in follow-up for persistent/permanent atrial fibrillation/flutter with a rapid ventricular response and interval deterioration of LV systolic function   DATE TEST EF   8/16    Echo  55-60 % Severe MR 2/2 post leaflet prolapse  12/16    Echo  55-60 % Mild Aortic root dilitation MR mild  8/18 Echo  45-50%   5/19 Echo  20-25%   8/19 Echo  25-30%      He has been disinclined because of his eyes to undergo anticoagulation and cardioversion even for a short period of time .Hence, we elected to use amiodarone as a class IIb indication for rate control as a potential alternative to AV junction ablation and pacing   intercurrently  amio has been discontinued because of nightmares.    DATE TEST   9/18 Holter      Mean 123 (94-135)  12/18 Holter Mean HR 112 (94-135)          Date Cr K TSH ALT   10/18 1.23 3.4 5.31(8/18)  12  10/18 1..44 3.9 9.9>>8.7   5/19 0.97 3.9       5/19 he presented with acute systolic heart failure with symptoms of impending doom.  He was in atrial flutter with uncontrolled rate.  He was at that juncture willing and underwent CRT-P and AV junction ablation.  I did his CRT-P Dr. Greggory Brandy did his AV ablation  He felt initially better.  He still feels considerably better prior to ablation.  He has significant fatigue and moderate shortness of breath but is without nocturnal dyspnea orthopnea or edema.  He has orthostatic lightheadedness.  He has had some presyncope.   Past Medical History:  Diagnosis Date  . Arthritis   . Asthma   . Bradycardia   . CAD in native artery    a. LHC 09/2015: 40% pCx, 35% mRCA.  [name] Chronic diastolic congestive heart failure ([place])   . COPD (chronic obstructive pulmonary disease) ([place])   . Depression   . Dilation of intestine 01/2015    . Fibromyalgia   . Frequent headaches   . GERD (gastroesophageal reflux disease)   . History of blood clots    eye   . History of hiatal hernia   . History of rheumatic fever   . Hypertension   . Kidney stone   . PAF (paroxysmal atrial fibrillation) ([place])    a. s/p TEE/DCCV 07/2015. b. H/o bleeding on Coumadin when INR >5, changed to Eliquis.  . S/P Minimally invasive maze operation for atrial fibrillation 10/22/2015   Complete bilateral atrial lesion set using cryothermy and bipolar radiofrequency ablation with clipping of LA appendage via right mini thoracotomy approach  . S/P minimally invasive mitral valve repair 10/22/2015   Complex valvuloplasty including triangular resection of posterior leaflet, artificial Gore-tex neochord placement x6 and 38 mm Sorin Memo 3D Rechord ring annuloplasty via right minithoracotomy approach  . Severe mitral regurgitation    a. s/p MV repair 10/2015.  [name] Sleep apnea   . Stroke ([place])   . Thyroid disorder   . TIA (transient ischemic attack)   . Tobacco abuse     Past Surgical History:  Procedure Laterality Date  . ANKLE SURGERY    . AV NODE ABLATION N/A 05/04/2018   Procedure: AV  NODE ABLATION;  Surgeon: Thompson Grayer, MD;  Location: [place] [facility];  Service: Cardiovascular;  Laterality: N/A;  . BIV PACEMAKER INSERTION CRT-P N/A 05/03/2018   Procedure: BIV PACEMAKER INSERTION CRT-P;  Surgeon: Deboraha Sprang, MD;  Location: [place] [facility];  Service: Cardiovascular;  Laterality: N/A;  . CARDIAC CATHETERIZATION N/A 10/03/2015   Procedure: Right and Left Heart Cath and Coronary Angiography;  Surgeon: [name], MD;  Location: [place] [facility];  Service: Cardiovascular;  Laterality: N/A;  . COLONOSCOPY    . ELECTROPHYSIOLOGIC STUDY N/A 08/18/2015   Procedure: CARDIOVERSION;  Surgeon: Wellington Hampshire, MD;  Location: [facility];  Service: Cardiovascular;  Laterality: N/A;  . MINIMALLY INVASIVE MAZE PROCEDURE N/A 10/22/2015   Procedure:  MINIMALLY INVASIVE MAZE PROCEDURE;  Surgeon: Rexene Alberts, MD;  Location: [place];  Service: Open Heart Surgery;  Laterality: N/A;  . MITRAL VALVE REPAIR Right 10/22/2015   Procedure: MINIMALLY INVASIVE MITRAL VALVE REPAIR (MVR);  Surgeon: Rexene Alberts, MD;  Location: [place];  Service: Open Heart Surgery;  Laterality: Right;  . SINUS EXPLORATION    . TEE WITHOUT CARDIOVERSION N/A 08/18/2015   Procedure: TRANSESOPHAGEAL ECHOCARDIOGRAM (TEE);  Surgeon: Wellington Hampshire, MD;  Location: [facility];  Service: Cardiovascular;  Laterality: N/A;  . TEE WITHOUT CARDIOVERSION N/A 10/22/2015   Procedure: TRANSESOPHAGEAL ECHOCARDIOGRAM (TEE);  Surgeon: Rexene Alberts, MD;  Location: [place];  Service: Open Heart Surgery;  Laterality: N/A;    Current Outpatient Medications  Medication Sig Dispense Refill  . albuterol (PROVENTIL HFA;VENTOLIN HFA) 108 (90 Base) MCG/ACT inhaler Inhale 2 puffs into the lungs every 6 (six) hours as needed for wheezing or shortness of breath. 1 Inhaler 2  . ALPRAZolam (XANAX) 0.25 MG tablet Take 1 tablet (0.25 mg total) by mouth 2 (two) times daily as needed for anxiety or sleep. 60 tablet 0  . carvedilol (COREG) 6.25 MG tablet Take 1 tablet (6.25 mg total) by mouth 2 (two) times daily with a meal. 180 tablet 3  . fluticasone furoate-vilanterol (BREO ELLIPTA) 100-25 MCG/INH AEPB Inhale 1 puff into the lungs daily. 28 each 5  . furosemide (LASIX) 40 MG tablet Take 1 tablet (40 mg total) by mouth 2 (two) times daily as needed. 60 tablet 6  . polyethylene glycol powder (GLYCOLAX/MIRALAX) powder Take 17 g by mouth daily as needed for mild constipation or moderate constipation. Start 1 cap for few days, if need increase to 2 caps 250 g 1   No current facility-administered medications for this visit.     Allergies  Allergen Reactions  . Codeine Nausea Only  . Digoxin And Related     SOB, bad dreams  . Macrodantin [Nitrofurantoin Macrocrystal] Rash  . Morphine And Related Rash       Review of Systems negative except from HPI and PMH  Physical Exam BP 124/76 (BP Location: Left Arm, Patient Position: Sitting, Cuff Size: Normal)   Pulse 81   Ht 5\' 10"  (1.778 m)   Wt 190 lb (86.2 kg)   BMI 27.26 kg/m  Well developed and nourished in no acute distress HENT normal Neck supple with JVP-flat Clear Device pocket well healed; without hematoma or erythema.  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  Symptomatically he remains better.  His orthostatic lightheadedness may preclude the use of Entresto.  We reviewed again the fact that he remains in atrial flutter/fibrillation permanently.  A lengthy discussion regarding the emotional traumas of his life and the unfortunate situations In his life   More than 50% of 45 min was spent in counseling related to the above

## 2018-08-15 ENCOUNTER — Other Ambulatory Visit: Payer: Self-pay | Admitting: Family Medicine

## 2018-08-15 DIAGNOSIS — G4701 Insomnia due to medical condition: Secondary | ICD-10-CM

## 2018-08-15 MED ORDER — ALPRAZOLAM 0.25 MG PO TABS: 0.2500 mg | ORAL_TABLET | Freq: Two times a day (BID) | ORAL | 2 refills | Status: DC | PRN

## 2018-08-15 NOTE — Telephone Encounter (Signed)
[facility] [place]

## 2018-08-29 ENCOUNTER — Telehealth: Payer: Self-pay

## 2018-08-29 NOTE — Telephone Encounter (Signed)
Prior Auth Started for [employer] 24-26MG  tablets.  Wait for Determination Please wait for [facility] NCPDP 2017 to return a determination.

## 2018-08-30 NOTE — Telephone Encounter (Signed)
Message from Plan: PA Case: [id number],  Status: Approved,  Coverage Starts on: 08/29/2018, Coverage Ends on: 08/28/2020 Questions? Contact [phone]

## 2018-08-31 ENCOUNTER — Telehealth: Payer: Self-pay | Admitting: Nurse Practitioner

## 2018-09-01 ENCOUNTER — Telehealth: Payer: Self-pay | Admitting: *Deleted

## 2018-09-01 NOTE — Telephone Encounter (Signed)
Left voicemail for patient encouraging him to return call as lung screening follow up of lung rads 3 finding is due.

## 2018-09-13 ENCOUNTER — Telehealth: Payer: Self-pay | Admitting: *Deleted

## 2018-09-13 DIAGNOSIS — Z122 Encounter for screening for malignant neoplasm of respiratory organs: Secondary | ICD-10-CM

## 2018-09-13 LAB — CUP PACEART INCLINIC DEVICE CHECK
Battery Remaining Longevity: 90 mo
Battery Voltage: 2.99 V
Brady Statistic RA Percent Paced: 0 %
Date Time Interrogation Session: 20190813163849
Implantable Lead Implant Date: 20190515
Implantable Lead Implant Date: 20190515
Implantable Lead Location: 753858
Implantable Lead Location: 753860
Implantable Lead Model: 5076
Implantable Pulse Generator Implant Date: 20190515
Lead Channel Impedance Value: 1100 Ohm
Lead Channel Impedance Value: 550 Ohm
Lead Channel Pacing Threshold Amplitude: 0.5 V
Lead Channel Pacing Threshold Amplitude: 1 V
Lead Channel Pacing Threshold Pulse Width: 0.5 ms
Lead Channel Pacing Threshold Pulse Width: 0.5 ms
Lead Channel Sensing Intrinsic Amplitude: 6.3 mV
Lead Channel Setting Pacing Amplitude: 2.5 V
Lead Channel Setting Pacing Amplitude: 2.5 V
Lead Channel Setting Pacing Pulse Width: 0.5 ms
Lead Channel Setting Pacing Pulse Width: 0.5 ms
Lead Channel Setting Sensing Sensitivity: 2 mV
Pulse Gen Serial Number: 9427031

## 2018-09-13 NOTE — Telephone Encounter (Signed)
Patient has been notified that the annual lung cancer screening low dose CT scan is due currently or will be in the near future.  Confirmed that the patient is within the age range of 36-80, and asymptomatic, and currently exhibits no signs or symptoms of lung cancer.  Patient denies illness that would prevent curative treatment for lung cancer if found.  Verified smoking history, 45 pkyr history former smoker at this time.  The shared decision making visit was completed on 03-07-18.  Patient is agreeable for the CT scan to be scheduled.  Will call patient back with date and time of appointment.

## 2018-09-13 NOTE — Telephone Encounter (Signed)
Message left

## 2018-09-13 NOTE — Telephone Encounter (Signed)
Attempted to contact patient r/t LDCT Screening follow up due at this time.  No answer received, message left for patient to call [phone] to schedule appointment.    

## 2018-09-19 ENCOUNTER — Telehealth: Payer: Self-pay | Admitting: *Deleted

## 2018-09-19 NOTE — Telephone Encounter (Signed)
Called patient to inform of the ldct screening appt for Monday 10/02/2018 @ 9:35am here @ [facility], message left with information r/t appt and appt mailed to patient.

## 2018-10-02 ENCOUNTER — Ambulatory Visit
Admission: RE | Admit: 2018-10-02 | Discharge: 2018-10-02 | Disposition: A | Discharge status: Home / Self Care | Payer: Medicare HMO | Source: Ambulatory Visit | Attending: Nurse Practitioner | Admitting: Nurse Practitioner

## 2018-10-02 DIAGNOSIS — I700 Atherosclerosis of aorta: Secondary | ICD-10-CM | POA: Insufficient documentation

## 2018-10-02 DIAGNOSIS — Z122 Encounter for screening for malignant neoplasm of respiratory organs: Secondary | ICD-10-CM | POA: Insufficient documentation

## 2018-10-02 DIAGNOSIS — I2510 Atherosclerotic heart disease of native coronary artery without angina pectoris: Secondary | ICD-10-CM | POA: Diagnosis not present

## 2018-10-02 DIAGNOSIS — J439 Emphysema, unspecified: Secondary | ICD-10-CM | POA: Diagnosis not present

## 2018-10-02 DIAGNOSIS — R918 Other nonspecific abnormal finding of lung field: Secondary | ICD-10-CM | POA: Diagnosis not present

## 2018-10-03 ENCOUNTER — Encounter: Payer: Self-pay | Admitting: *Deleted

## 2018-10-31 ENCOUNTER — Ambulatory Visit (INDEPENDENT_AMBULATORY_CARE_PROVIDER_SITE_OTHER): Payer: Medicare HMO | Admitting: *Deleted

## 2018-10-31 DIAGNOSIS — I428 Other cardiomyopathies: Secondary | ICD-10-CM

## 2018-10-31 DIAGNOSIS — I4819 Other persistent atrial fibrillation: Secondary | ICD-10-CM

## 2018-10-31 NOTE — Progress Notes (Signed)
Remote pacemaker transmission.   

## 2018-11-07 ENCOUNTER — Encounter: Payer: Self-pay | Admitting: Internal Medicine

## 2018-11-07 ENCOUNTER — Ambulatory Visit (INDEPENDENT_AMBULATORY_CARE_PROVIDER_SITE_OTHER): Payer: Medicare HMO | Admitting: Internal Medicine

## 2018-11-07 VITALS — BP 128/84 | HR 82 | Ht 70.0 in | Wt 191.5 lb

## 2018-11-07 DIAGNOSIS — I442 Atrioventricular block, complete: Secondary | ICD-10-CM | POA: Diagnosis not present

## 2018-11-07 DIAGNOSIS — I4819 Other persistent atrial fibrillation: Secondary | ICD-10-CM | POA: Diagnosis not present

## 2018-11-07 DIAGNOSIS — I5043 Acute on chronic combined systolic (congestive) and diastolic (congestive) heart failure: Secondary | ICD-10-CM

## 2018-11-07 DIAGNOSIS — Z950 Presence of cardiac pacemaker: Secondary | ICD-10-CM

## 2018-11-07 DIAGNOSIS — I428 Other cardiomyopathies: Secondary | ICD-10-CM | POA: Diagnosis not present

## 2018-11-07 DIAGNOSIS — E039 Hypothyroidism, unspecified: Secondary | ICD-10-CM

## 2018-11-07 DIAGNOSIS — R29898 Other symptoms and signs involving the musculoskeletal system: Secondary | ICD-10-CM

## 2018-11-07 LAB — CUP PACEART INCLINIC DEVICE CHECK
Battery Remaining Longevity: 91 mo
Battery Voltage: 3.01 V
Brady Statistic RA Percent Paced: 0 %
Brady Statistic RV Percent Paced: 87 %
Date Time Interrogation Session: 20191119170327
Implantable Lead Implant Date: 20190515
Implantable Lead Implant Date: 20190515
Implantable Lead Location: 753858
Implantable Lead Location: 753860
Implantable Lead Model: 5076
Implantable Pulse Generator Implant Date: 20190515
Lead Channel Impedance Value: 1062.5 Ohm
Lead Channel Impedance Value: 550 Ohm
Lead Channel Pacing Threshold Amplitude: 0.5 V
Lead Channel Pacing Threshold Amplitude: 1 V
Lead Channel Pacing Threshold Pulse Width: 0.5 ms
Lead Channel Pacing Threshold Pulse Width: 0.5 ms
Lead Channel Setting Pacing Amplitude: 2.5 V
Lead Channel Setting Pacing Amplitude: 2.5 V
Lead Channel Setting Pacing Pulse Width: 0.5 ms
Lead Channel Setting Pacing Pulse Width: 0.5 ms
Lead Channel Setting Sensing Sensitivity: 2 mV
Pulse Gen Serial Number: 9427031

## 2018-11-07 NOTE — Patient Instructions (Signed)
Medication Instructions:  - Your physician recommends that you continue on your current medications as directed. Please refer to the Current Medication list given to you today.  If you need a refill on your cardiac medications before your next appointment, please call your pharmacy.   Lab work: - Your physician recommends that you have lab work today: CMET/ TSH/ Free T4/ Magnesium  If you have labs (blood work) drawn today and your tests are completely normal, you will receive your results only by: [name] MyChart Message (if you have MyChart) OR . A paper copy in the mail If you have any lab test that is abnormal or we need to change your treatment, we will call you to review the results.  Testing/Procedures: - Your physician has requested that you have an echocardiogram. Echocardiography is a painless test that uses sound waves to create images of your heart. It provides your doctor with information about the size and shape of your heart and how well your heart's chambers and valves are working. This procedure takes approximately one hour. There are no restrictions for this procedure.  Follow-Up: At [facility], you and your health needs are our priority.  As part of our continuing mission to provide you with exceptional heart care, we have created designated Provider Care Teams.  These Care Teams include your primary Cardiologist (physician) and Advanced Practice Providers (APPs -  Physician Assistants and Nurse Practitioners) who all work together to provide you with the care you need, when you need it. . You will need a follow up appointment in 6 months with Dr. Caryl Comes. Please call our office 2 months in advance to schedule this appointment.   Remote monitoring is used to monitor your Pacemaker of ICD from home. This monitoring reduces the number of office visits required to check your device to one time per year. It allows [place] to keep an eye on the functioning of your device to ensure it is  working properly. You are scheduled for a device check from home on 01/30/19. You may send your transmission at any time that day. If you have a wireless device, the transmission will be sent automatically. After your physician reviews your transmission, you will receive a postcard with your next transmission date.  Any Other Special Instructions Will Be Listed Below (If Applicable). - N/A

## 2018-11-07 NOTE — Progress Notes (Signed)
Patient Care Team: [name], DO as PCP - General (Family Medicine) [name], MD as Consulting Physician (Cardiology)   HPI  Michael Delacruz is a 77 y.o. male seen in follow-up for persistent/permanent atrial fibrillation/flutter with a rapid ventricular response and interval deterioration of LV systolic function   DATE TEST EF   8/16    Echo  55-60 % Severe MR 2/2 post leaflet prolapse  12/16    Echo  55-60 % Mild Aortic root dilitation MR mild  8/18 Echo  45-50%   5/19 Echo  20-25%   8/19 Echo  25-30%           He has been disinclined because of his eyes to undergo anticoagulation and cardioversion even for a short period of time .Hence, we elected to use amiodarone as a class IIb indication for rate control as a potential alternative to AV junction ablation and pacing   intercurrently  amio has been discontinued because of nightmares.    5/19 he presented with acute systolic heart failure with symptoms of impending doom.  He was in atrial flutter with uncontrolled rate.  He was at that juncture willing and underwent CRT-P and AV junction ablation.  I did his CRT-P Dr. Greggory Brandy did his AV ablation  DATE TEST   9/18 Holter      Mean 123 (94-135)  12/18 Holter Mean HR 112 (94-135)          Date Cr K TSH ALT   10/18 1.23 3.4 5.31(8/18)  12  10/18 1..44 3.9 9.9>>8.7   5/19 0.97 3.9           Attempted at last visit to use Entresto.  Failed because of orthostasis.  Shortness of breath is modest.  No edema.  Biggest complaint is leg weakness.  He is also having of the forgetfulness  Past Medical History:  Diagnosis Date  . Arthritis   . Asthma   . Bradycardia   . CAD in native artery    a. LHC 09/2015: 40% pCx, 35% mRCA.  [name] Chronic diastolic congestive heart failure ([place])   . COPD (chronic obstructive pulmonary disease) ([place])   . Depression   . Dilation of intestine 01/2015  . Fibromyalgia   . Frequent headaches   . GERD  (gastroesophageal reflux disease)   . History of blood clots    eye   . History of hiatal hernia   . History of rheumatic fever   . Hypertension   . Kidney stone   . PAF (paroxysmal atrial fibrillation) ([place])    a. s/p TEE/DCCV 07/2015. b. H/o bleeding on Coumadin when INR >5, changed to Eliquis.  . S/P Minimally invasive maze operation for atrial fibrillation 10/22/2015   Complete bilateral atrial lesion set using cryothermy and bipolar radiofrequency ablation with clipping of LA appendage via right mini thoracotomy approach  . S/P minimally invasive mitral valve repair 10/22/2015   Complex valvuloplasty including triangular resection of posterior leaflet, artificial Gore-tex neochord placement x6 and 38 mm Sorin Memo 3D Rechord ring annuloplasty via right minithoracotomy approach  . Severe mitral regurgitation    a. s/p MV repair 10/2015.  [name] Sleep apnea   . Stroke ([place])   . Thyroid disorder   . TIA (transient ischemic attack)   . Tobacco abuse     Past Surgical History:  Procedure Laterality Date  . ANKLE SURGERY    . AV NODE ABLATION N/A 05/04/2018   Procedure: AV  NODE ABLATION;  Surgeon: Thompson Grayer, MD;  Location: [place] [facility];  Service: Cardiovascular;  Laterality: N/A;  . BIV PACEMAKER INSERTION CRT-P N/A 05/03/2018   Procedure: BIV PACEMAKER INSERTION CRT-P;  Surgeon: Deboraha Sprang, MD;  Location: [place] [facility];  Service: Cardiovascular;  Laterality: N/A;  . CARDIAC CATHETERIZATION N/A 10/03/2015   Procedure: Right and Left Heart Cath and Coronary Angiography;  Surgeon: [name], MD;  Location: [place] [facility];  Service: Cardiovascular;  Laterality: N/A;  . COLONOSCOPY    . ELECTROPHYSIOLOGIC STUDY N/A 08/18/2015   Procedure: CARDIOVERSION;  Surgeon: Wellington Hampshire, MD;  Location: [facility];  Service: Cardiovascular;  Laterality: N/A;  . MINIMALLY INVASIVE MAZE PROCEDURE N/A 10/22/2015   Procedure: MINIMALLY INVASIVE MAZE PROCEDURE;  Surgeon: Rexene Alberts, MD;  Location: [place];  Service: Open Heart Surgery;  Laterality: N/A;  . MITRAL VALVE REPAIR Right 10/22/2015   Procedure: MINIMALLY INVASIVE MITRAL VALVE REPAIR (MVR);  Surgeon: Rexene Alberts, MD;  Location: [place];  Service: Open Heart Surgery;  Laterality: Right;  . SINUS EXPLORATION    . TEE WITHOUT CARDIOVERSION N/A 08/18/2015   Procedure: TRANSESOPHAGEAL ECHOCARDIOGRAM (TEE);  Surgeon: Wellington Hampshire, MD;  Location: [facility];  Service: Cardiovascular;  Laterality: N/A;  . TEE WITHOUT CARDIOVERSION N/A 10/22/2015   Procedure: TRANSESOPHAGEAL ECHOCARDIOGRAM (TEE);  Surgeon: Rexene Alberts, MD;  Location: [place];  Service: Open Heart Surgery;  Laterality: N/A;    Current Outpatient Medications  Medication Sig Dispense Refill  . albuterol (PROVENTIL HFA;VENTOLIN HFA) 108 (90 Base) MCG/ACT inhaler Inhale 2 puffs into the lungs every 6 (six) hours as needed for wheezing or shortness of breath. 1 Inhaler 2  . ALPRAZolam (XANAX) 0.25 MG tablet Take 1 tablet (0.25 mg total) by mouth 2 (two) times daily as needed for anxiety or sleep. 60 tablet 2  . carvedilol (COREG) 6.25 MG tablet Take 0.5 tablets (3.125 mg total) by mouth 2 (two) times daily with a meal. (Patient taking differently: Take 6.25 mg by mouth 2 (two) times daily with a meal. ) 180 tablet 3  . fluticasone furoate-vilanterol (BREO ELLIPTA) 100-25 MCG/INH AEPB Inhale 1 puff into the lungs daily. (Patient taking differently: Inhale 1 puff into the lungs as needed. ) 28 each 5  . furosemide (LASIX) 40 MG tablet Take 1 tablet (40 mg total) by mouth 2 (two) times daily as needed. 60 tablet 6  . polyethylene glycol powder (GLYCOLAX/MIRALAX) powder Take 17 g by mouth daily as needed for mild constipation or moderate constipation. Start 1 cap for few days, if need increase to 2 caps 250 g 1   No current facility-administered medications for this visit.     Allergies  Allergen Reactions  . Codeine Nausea Only  . Digoxin And Related       SOB, bad dreams  . Macrodantin [Nitrofurantoin Macrocrystal] Rash  . Morphine And Related Rash      Review of Systems negative except from HPI and PMH  Physical Exam BP 128/84 (BP Location: Left Arm, Patient Position: Sitting, Cuff Size: Normal)   Pulse 82   Ht 5\' 10"  (1.778 m)   Wt 191 lb 8 oz (86.9 kg)   BMI 27.48 kg/m   Well developed and nourished in no acute distress HENT normal Neck supple with JVP-flat Clear Regular rate and rhythm, no murmurs or gallops Abd-soft with active BS No Clubbing cyanosis edema Skin-warm and dry A & Oriented  Grossly normal sensory  and motor function lower extremities testing specifically  ECG atrial flutter with underlying complete heart block ventricular pacing at 82 with a QRS duration 120 ms   Assessment and  Plan  Congestive heart failure   chronic  Atrial flutter RVR--    Hx of MAZE and AtriaClip  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  We will try with Aldactone if his left ventricular function remains depressed.  He would like to do the echo first; if less than 35% is agreeable to trying the Aldactone.  We have reviewed side effects and issues of hyperkalemia.  With his weakness, we will check his magnesium and thyroid functions last checked about a year ago.  Euvolemic.  Not on anticoagulation  We spent more than 50% of our >25 min visit in face to face counseling regarding the above

## 2018-11-08 LAB — COMPREHENSIVE METABOLIC PANEL
ALT: 10 IU/L (ref 0–44)
AST: 11 IU/L (ref 0–40)
Albumin/Globulin Ratio: 1.7 (ref 1.2–2.2)
Albumin: 4 g/dL (ref 3.5–4.8)
Alkaline Phosphatase: 80 IU/L (ref 39–117)
BUN/Creatinine Ratio: 9 — ABNORMAL LOW (ref 10–24)
BUN: 9 mg/dL (ref 8–27)
Bilirubin Total: 0.7 mg/dL (ref 0.0–1.2)
CO2: 18 mmol/L — ABNORMAL LOW (ref 20–29)
Calcium: 8.9 mg/dL (ref 8.6–10.2)
Chloride: 107 mmol/L — ABNORMAL HIGH (ref 96–106)
Creatinine, Ser: 0.97 mg/dL (ref 0.76–1.27)
GFR calc Af Amer: 88 mL/min/{1.73_m2} (ref >59)
GFR calc non Af Amer: 76 mL/min/{1.73_m2} (ref >59)
Globulin, Total: 2.4 g/dL (ref 1.5–4.5)
Glucose: 92 mg/dL (ref 65–99)
Potassium: 4.4 mmol/L (ref 3.5–5.2)
Sodium: 140 mmol/L (ref 134–144)
Total Protein: 6.4 g/dL (ref 6.0–8.5)

## 2018-11-08 LAB — TSH: TSH: 3.96 u[IU]/mL (ref 0.450–4.500)

## 2018-11-08 LAB — T4, FREE: Free T4: 1.12 ng/dL (ref 0.82–1.77)

## 2018-11-08 LAB — MAGNESIUM: Magnesium: 2.1 mg/dL (ref 1.6–2.3)

## 2018-11-20 ENCOUNTER — Other Ambulatory Visit: Payer: Self-pay | Admitting: Nurse Practitioner

## 2018-11-20 DIAGNOSIS — G4701 Insomnia due to medical condition: Secondary | ICD-10-CM

## 2018-11-20 MED ORDER — ALPRAZOLAM 0.25 MG PO TABS: 0.2500 mg | ORAL_TABLET | Freq: Two times a day (BID) | ORAL | 2 refills | Status: DC | PRN

## 2018-11-20 NOTE — Telephone Encounter (Signed)
Pt needs a refill on [facility] [place].

## 2018-11-23 ENCOUNTER — Other Ambulatory Visit: Payer: Self-pay

## 2018-11-23 ENCOUNTER — Ambulatory Visit (INDEPENDENT_AMBULATORY_CARE_PROVIDER_SITE_OTHER): Payer: Medicare HMO

## 2018-11-23 DIAGNOSIS — I428 Other cardiomyopathies: Secondary | ICD-10-CM

## 2018-11-24 ENCOUNTER — Other Ambulatory Visit: Payer: Self-pay | Admitting: Family Medicine

## 2018-11-24 DIAGNOSIS — G4701 Insomnia due to medical condition: Secondary | ICD-10-CM

## 2018-12-25 ENCOUNTER — Other Ambulatory Visit: Payer: Self-pay | Admitting: Family Medicine

## 2018-12-25 ENCOUNTER — Other Ambulatory Visit: Payer: Self-pay | Admitting: Cardiovascular Disease

## 2018-12-25 DIAGNOSIS — G4701 Insomnia due to medical condition: Secondary | ICD-10-CM

## 2019-01-02 LAB — CUP PACEART REMOTE DEVICE CHECK
Date Time Interrogation Session: 20200114025221
Implantable Lead Implant Date: 20190515
Implantable Lead Implant Date: 20190515
Implantable Lead Location: 753858
Implantable Lead Location: 753860
Implantable Lead Model: 5076
Implantable Pulse Generator Implant Date: 20190515
Pulse Gen Serial Number: 9427031

## 2019-01-17 ENCOUNTER — Ambulatory Visit: Payer: Self-pay | Admitting: Family Medicine

## 2019-01-26 ENCOUNTER — Ambulatory Visit (INDEPENDENT_AMBULATORY_CARE_PROVIDER_SITE_OTHER): Payer: Self-pay | Admitting: Family Medicine

## 2019-01-26 VITALS — BP 118/87 | HR 80 | Temp 97.6°F | Ht 70.0 in | Wt 203.4 lb

## 2019-01-26 DIAGNOSIS — R531 Weakness: Secondary | ICD-10-CM

## 2019-01-26 DIAGNOSIS — F331 Major depressive disorder, recurrent, moderate: Secondary | ICD-10-CM

## 2019-01-26 DIAGNOSIS — J432 Centrilobular emphysema: Secondary | ICD-10-CM

## 2019-01-26 DIAGNOSIS — I5022 Chronic systolic (congestive) heart failure: Secondary | ICD-10-CM

## 2019-01-26 DIAGNOSIS — F411 Generalized anxiety disorder: Secondary | ICD-10-CM

## 2019-01-26 DIAGNOSIS — R42 Dizziness and giddiness: Secondary | ICD-10-CM

## 2019-01-26 DIAGNOSIS — I4891 Unspecified atrial fibrillation: Secondary | ICD-10-CM

## 2019-01-27 NOTE — Progress Notes (Signed)
Subjective:    Patient ID: Michael Delacruz, male    DOB: 1941-09-28, 78 y.o.   MRN: [id number]  Michael Delacruz is a 78 y.o. male presenting on 01/26/2019 for Dizziness   HPI  Dizziness / Vertigo / Episodic Weakness / Headache He reports persistent problem seems gradual worse, with now more difficult to keep up with activity, he has episodes of weakness in extremity, and feels dizzy at times, seems brief episodes lasting seconds to minutes, some time positional vertigo worse or provoked. - He has history of pacer that was managed with HR back up, see below - He admits headaches episodically as well - Requesting referral to Neurologist Denies persistent numbness tingling weakness  Complete Heart Block / Atrial Fibrillation with RVR s/p pacer / CHF Systolic Followed by [facility] and EP. He has pacer has been adjusted. He is on BB Last echo showed improved EF but still reduced 30-35% Denies edema, chest pain  Major Depression, chronic recurrent, moderate / Generalized Anxiety Disorder He reports worsening difficulty with mood and anxiety now with decline in physical function, can't do the things he used to be able to do. Admits some anxiety improved on xanax PRN Denies suicidal ideation  Centrilobular Emphysema No new concerns. He has not returned to Pulmonology. No recent exacerbation. Denies dyspnea, cough   Depression screen [facility] 2/9 01/28/2019 03/27/2018 02/21/2018  Decreased Interest 2 2 2   Down, Depressed, Hopeless 2 2 2   PHQ - 2 Score 4 4 4   Altered sleeping 2 2 3   Tired, decreased energy 3 2 -  Change in appetite 2 2 2   Feeling bad or failure about yourself  0 2 1  Trouble concentrating 2 2 1   Moving slowly or fidgety/restless 2 1 0  Suicidal thoughts 0 0 0  PHQ-9 Score 15 15 11   Difficult doing work/chores Somewhat difficult Somewhat difficult Not difficult at all    Social History   Tobacco Use  . Smoking status: Current Every Day Smoker    Packs/day: 0.50    Years:  52.00    Pack years: 26.00    Types: Cigarettes    Last attempt to quit: 02/13/2018    Years since quitting: 0.9  . Smokeless tobacco: Never Used  . Tobacco comment: Resumed smoking, after quit 01/2018  Substance Use Topics  . Alcohol use: No    Alcohol/week: 1.0 standard drinks    Types: 1 Standard drinks or equivalent per week    Comment: rare  . Drug use: No    Review of Systems Per HPI unless specifically indicated above     Objective:    BP 118/87 (BP Location: Left Arm, Patient Position: Sitting, Cuff Size: Normal)   Pulse 80   Temp 97.6 F (36.4 C) (Oral)   Ht 5' 10"  (1.778 m)   Wt 203 lb 6.4 oz (92.3 kg)   BMI 29.18 kg/m   Wt Readings from Last 3 Encounters:  01/28/19 203 lb 6.4 oz (92.3 kg)  11/07/18 191 lb 8 oz (86.9 kg)  08/01/18 190 lb (86.2 kg)    Physical Exam Vitals signs and nursing note reviewed.  Constitutional:      General: He is not in acute distress.    Appearance: He is well-developed. He is not diaphoretic.     Comments: Well-appearing, comfortable, cooperative  HENT:     Head: Normocephalic and atraumatic.  Eyes:     General:        Right eye: No discharge.  Left eye: No discharge.     Conjunctiva/sclera: Conjunctivae normal.  Cardiovascular:     Rate and Rhythm: Normal rate.  Pulmonary:     Effort: Pulmonary effort is normal.  Skin:    General: Skin is warm and dry.     Findings: No erythema or rash.  Neurological:     Mental Status: He is alert and oriented to person, place, and time.  Psychiatric:        Behavior: Behavior normal.     Comments: Well groomed, good eye contact, normal speech and thoughts    Results for orders placed or performed in visit on 11/07/18  Comp Met (CMET)  Result Value Ref Range   Glucose 92 65 - 99 mg/dL   BUN 9 8 - 27 mg/dL   Creatinine, Ser 0.97 0.76 - 1.27 mg/dL   GFR calc non Af Amer 76 >59 mL/min/1.73   GFR calc Af Amer 88 >59 mL/min/1.73   BUN/Creatinine Ratio 9 (L) 10 - 24   Sodium  140 134 - 144 mmol/L   Potassium 4.4 3.5 - 5.2 mmol/L   Chloride 107 (H) 96 - 106 mmol/L   CO2 18 (L) 20 - 29 mmol/L   Calcium 8.9 8.6 - 10.2 mg/dL   Total Protein 6.4 6.0 - 8.5 g/dL   Albumin 4.0 3.5 - 4.8 g/dL   Globulin, Total 2.4 1.5 - 4.5 g/dL   Albumin/Globulin Ratio 1.7 1.2 - 2.2   Bilirubin Total 0.7 0.0 - 1.2 mg/dL   Alkaline Phosphatase 80 39 - 117 IU/L   AST 11 0 - 40 IU/L   ALT 10 0 - 44 IU/L  TSH  Result Value Ref Range   TSH 3.960 0.450 - 4.500 uIU/mL  T4, free  Result Value Ref Range   Free T4 1.12 0.82 - 1.77 ng/dL  Magnesium  Result Value Ref Range   Magnesium 2.1 1.6 - 2.3 mg/dL  CUP PACEART [facility] DEVICE CHECK  Result Value Ref Range   Date Time Interrogation Session [phone]    Pulse Generator Manufacturer [facility]    Pulse Gen Model [id number] Quadra Allure    Pulse Gen Serial Number [id number]    Clinic Name [facility]    Implantable Pulse Generator Type Implantable Pulse Generator    Implantable Pulse Generator Implant Date [id number]    Implantable Lead Manufacturer [facility]    Implantable Lead Model 5076 CapSureFix Novus    Implantable Lead Serial Number [id number]    Implantable Lead Implant Date [id number]    Implantable Lead Location Detail 1 UNKNOWN    Implantable Lead Location [id number]    Implantable Lead Manufacturer [facility]    Implantable Lead Model 1458Q Quartet    Implantable Lead Serial Number [id number]    Implantable Lead Implant Date [id number]    Implantable Lead Location Detail 1 UNKNOWN    Implantable Lead Location [id number]    Lead Channel Setting Sensing Sensitivity 2.0 mV   Lead Channel Setting Pacing Pulse Width 0.5 ms   Lead Channel Setting Pacing Amplitude 2.5 V   Lead Channel Setting Pacing Pulse Width 0.5 ms   Lead Channel Setting Pacing Amplitude 2.5 V   Lead Channel Setting Pacing Capture Mode Fixed Pacing    Lead Channel Impedance Value 550.0 ohm   Lead Channel Pacing Threshold Amplitude 0.5 V   Lead Channel Pacing Threshold Pulse Width  0.5 ms   Lead Channel Impedance Value 1,062.5 ohm   Lead Channel Pacing Threshold Amplitude 1.0 V   Lead Channel  Pacing Threshold Pulse Width 0.5 ms   Battery Status Unknown    Battery Remaining Longevity 91 mo   Battery Voltage 3.01 V   Brady Statistic RA Percent Paced 0 %   Brady Statistic RV Percent Paced 87.0 %   Eval Rhythm BiVp w/bigeminal PVCs @ 30bpm       Assessment & Plan:   Problem List Items Addressed This Visit    Atrial fibrillation with RVR (HCC) Systolic CHF, chronic ([place]) Followed by Cardiology / EP On pacer BB - he is asking about adjusting or stopping this med in future - I advised him to discuss with his cardiologist, and recommend he continue current medication regimen    Centrilobular emphysema ([place]) - Primary Stable without current acute exacerbation On [place]    Depression, major, recurrent, moderate (HCC) Generalized anxiety disorder Currently with depressed mood, related to chronic illness and functional status overall Not on SSRI or other med Declines med and psychiatry / therapist at this time Improved anxiety and overall mood on xanax PRN still taking very low dose    Dizziness   Relevant Orders   Ambulatory referral to Neurology          Other Visit Diagnoses    Episodic weakness       Relevant Orders   Ambulatory referral to Neurology      Clinically with uncertain etiology dizziness. May be multifactorial. Seems to be vertigo and positional related. Similar history of vertigo. Cannot rule out if related to med/heart disease. On pacer already adjusted - he should continue to follow-up with Cardiology - Given associated headache and weakness - will refer to Neurology  No orders of the defined types were placed in this encounter.  Orders Placed This Encounter  Procedures  . Ambulatory referral to Neurology    Referral Priority:   Routine    Referral Type:   Consultation    Referral Reason:   Specialty Services Required    Requested  Specialty:   Neurology    Number of Visits Requested:   1    Follow up plan: Return in about 3 months (around 04/26/2019).  Nobie Putnam, [facility] 01/26/2019, 2:56 PM

## 2019-01-28 ENCOUNTER — Encounter: Payer: Self-pay | Admitting: Family Medicine

## 2019-01-28 NOTE — Patient Instructions (Addendum)
Thank you for coming to the office today.  Referral to Neurology specialist for dizziness, vertigo, and headaches  [facility]   Address: [address] [place], [place] [address] Hours: 8AM-5PM Phone: [phone]  1. You have symptoms of Vertigo (Benign Paroxysmal Positional Vertigo) - This is commonly caused by inner ear fluid imbalance, sometimes can be worsened by allergies and sinus symptoms, otherwise it can occur randomly sometimes and we may never discover the exact cause. - To treat this, try the Epley Manuever (see diagrams/instructions below) at home up to 3 times a day for 1-2 weeks or until symptoms resolve - You may take Meclizine as needed up to 3 times a day for dizziness, this will not cure symptoms but may help. Caution may make you drowsy.  If you develop significant worsening episode with vertigo that does not improve and you get severe headache, loss of vision, arm or leg weakness, slurred speech, or other concerning symptoms please seek immediate medical attention at Emergency Department.  Please schedule a follow-up appointment with Dr Parks Ranger within 4 weeks if Vertigo not improving, and will consider Referral to Vestibular Rehab  See the next page for images describing the Epley Manuever.     ----------------------------------------------------------------------------------------------------------------------        Please schedule a Follow-up Appointment to: Return in about 3 months (around 04/26/2019).  If you have any other questions or concerns, please feel free to call the office or send a message through [place]. You may also schedule an earlier appointment if necessary.  Additionally, you may be receiving a survey about your experience at our office within a few days to 1 week by e-mail or mail. We value your feedback.  Nobie Putnam, DO [place]

## 2019-01-30 ENCOUNTER — Ambulatory Visit (INDEPENDENT_AMBULATORY_CARE_PROVIDER_SITE_OTHER): Payer: Medicare HMO

## 2019-01-30 DIAGNOSIS — I442 Atrioventricular block, complete: Secondary | ICD-10-CM | POA: Diagnosis not present

## 2019-01-30 LAB — CUP PACEART REMOTE DEVICE CHECK
Date Time Interrogation Session: 20200211180546
Implantable Lead Implant Date: 20190515
Implantable Lead Implant Date: 20190515
Implantable Lead Location: 753858
Implantable Lead Location: 753860
Implantable Lead Model: 5076
Implantable Pulse Generator Implant Date: 20190515
Pulse Gen Serial Number: 9427031

## 2019-02-08 DIAGNOSIS — F419 Anxiety disorder, unspecified: Secondary | ICD-10-CM | POA: Diagnosis not present

## 2019-02-08 DIAGNOSIS — H5461 Unqualified visual loss, right eye, normal vision left eye: Secondary | ICD-10-CM | POA: Diagnosis not present

## 2019-02-08 DIAGNOSIS — Z8669 Personal history of other diseases of the nervous system and sense organs: Secondary | ICD-10-CM | POA: Diagnosis not present

## 2019-02-08 DIAGNOSIS — Z09 Encounter for follow-up examination after completed treatment for conditions other than malignant neoplasm: Secondary | ICD-10-CM | POA: Diagnosis not present

## 2019-02-08 DIAGNOSIS — E261 Secondary hyperaldosteronism: Secondary | ICD-10-CM | POA: Diagnosis not present

## 2019-02-08 DIAGNOSIS — Z950 Presence of cardiac pacemaker: Secondary | ICD-10-CM | POA: Diagnosis not present

## 2019-02-08 DIAGNOSIS — F331 Major depressive disorder, recurrent, moderate: Secondary | ICD-10-CM | POA: Diagnosis not present

## 2019-02-08 DIAGNOSIS — I509 Heart failure, unspecified: Secondary | ICD-10-CM | POA: Diagnosis not present

## 2019-02-12 NOTE — Progress Notes (Signed)
Remote pacemaker transmission.   

## 2019-02-14 ENCOUNTER — Encounter: Payer: Self-pay | Admitting: Neurology

## 2019-02-14 ENCOUNTER — Ambulatory Visit: Payer: Medicare HMO | Admitting: Neurology

## 2019-02-14 VITALS — Ht 70.0 in | Wt 203.5 lb

## 2019-02-14 DIAGNOSIS — H81399 Other peripheral vertigo, unspecified ear: Secondary | ICD-10-CM

## 2019-02-14 DIAGNOSIS — R413 Other amnesia: Secondary | ICD-10-CM | POA: Diagnosis not present

## 2019-02-14 DIAGNOSIS — R42 Dizziness and giddiness: Secondary | ICD-10-CM | POA: Diagnosis not present

## 2019-02-14 NOTE — Progress Notes (Signed)
[facility]  PATIENT: Michael Delacruz DOB: 11-14-1941  REFERRING DOCTOR OR PCP:  Dr. Parks Ranger SOURCE:   Patient, notes form May 2019 admission, imaging and lab reports, CT images personally reviewed  _________________________________   HISTORICAL  CHIEF COMPLAINT:  Chief Complaint  Patient presents with  . New Patient (Initial Visit)    RM 13, alone. Internal referral for dizziness/weakness. Also reports memory problems since he had his stomach surgery.    HISTORY OF PRESENT ILLNESS:  I had the pleasure of seeing your patient, Michael Delacruz, at [facility] for a neurologic consultation regarding his dizziness.     He is a 78 yo man with dizziness causing lightheadedness since last May after he had an AV node ablation and pacemaker placed.    Symptoms can occur in any position.    He feels off balanced many days when he is standing though symptoms fluctuate a lot.    Last month he was laying on the couch and had a 3-4 minute episode of visual changes.    Symptoms completely resolved.  In the past, he was on anticoagulation but not recently.     He feels his gait is poor and he needs to walk in baby steps as he is too off balanced to take larger steps.   Later in the day, he often walks better.    I reviewed some notes from the mid-May admission     He has little memory of the May 2019 hospitalization and underwent evaluation for left sided numbness and hallucinations after the procedure.   CT head and CTA 05/04/2018 report and images were personally reviewed and showed mild atrophy and chronic microvascular ischemia.     He had mild atherosclerosis in the carotid and vertebral arteries.   There were small bubbles in some cortical veins and superior sagittal sinus felt to be introduced by the pacemaker placement a day earlier  He reports having a hemorrhage affecting the right eye a few years ago while on warfarin.  He has very poor vision OD.    He sees spots and sometimes sees birds even when they are not there.   Sometimes he sees people who are not present.  They don't have legs and seem to be floating.     He also reports difficulty with his memory since his procedures last year.    He drives and has gotten lost couple times.        REVIEW OF SYSTEMS: Constitutional: No fevers, chills, sweats, or change in appetite,.   He sleeps well most nights Eyes: No visual changes, double vision, eye pain Ear, nose and throat: No hearing loss, ear pain, nasal congestion, sore throat Cardiovascular: As above Respiratory: No shortness of breath at rest or with exertion.   No wheezes GastrointestinaI: No nausea, vomiting, diarrhea, abdominal pain, fecal incontinence Genitourinary: No dysuria, urinary retention or frequency.  No nocturia. Musculoskeletal: No neck pain, back pain Integumentary: No rash, pruritus, skin lesions Neurological: as above Psychiatric: No depression at this time.  No anxiety Endocrine: No palpitations, diaphoresis, change in appetite, change in weigh or increased thirst Hematologic/Lymphatic: No anemia, purpura, petechiae. Allergic/Immunologic: No itchy/runny eyes, nasal congestion, recent allergic reactions, rashes  ALLERGIES: Allergies  Allergen Reactions  . Codeine Nausea Only  . Digoxin And Related     SOB, bad dreams  . Macrodantin [Nitrofurantoin Macrocrystal] Rash  . Morphine And Related Rash    HOME MEDICATIONS:  Current Outpatient Medications:  .  albuterol (PROVENTIL HFA;VENTOLIN HFA) 108 (90 Base) MCG/ACT inhaler, Inhale 2 puffs into the lungs every 6 (six) hours as needed for wheezing or shortness of breath., Disp: 1 Inhaler, Rfl: 2 .  ALPRAZolam (XANAX) 1 MG tablet, Take 0.5 tablets (0.5 mg total) by mouth 2 (two) times daily as needed for anxiety. May only take once if needed, as this is higher dose, Disp: 30 tablet, Rfl: 2 .  carvedilol (COREG) 6.25 MG tablet, Take 0.5 tablets (3.125 mg  total) by mouth 2 (two) times daily with a meal. (Patient taking differently: Take 6.25 mg by mouth 2 (two) times daily with a meal. ), Disp: 180 tablet, Rfl: 3 .  fluticasone furoate-vilanterol (BREO ELLIPTA) 100-25 MCG/INH AEPB, Inhale 1 puff into the lungs daily. (Patient taking differently: Inhale 1 puff into the lungs as needed. ), Disp: 28 each, Rfl: 5 .  furosemide (LASIX) 40 MG tablet, TAKE 1 TABLET BY MOUTH TWICE A DAY AS NEEDED, Disp: 60 tablet, Rfl: 3 .  polyethylene glycol powder (GLYCOLAX/MIRALAX) powder, Take 17 g by mouth daily as needed for mild constipation or moderate constipation. Start 1 cap for few days, if need increase to 2 caps, Disp: 250 g, Rfl: 1  PAST MEDICAL HISTORY: Past Medical History:  Diagnosis Date  . Arthritis   . Asthma   . Bradycardia   . CAD in native artery    a. LHC 09/2015: 40% pCx, 35% mRCA.  [name] COPD (chronic obstructive pulmonary disease) ([place])   . Dilation of intestine 01/2015  . Fibromyalgia   . Frequent headaches   . GERD (gastroesophageal reflux disease)   . History of blood clots    eye   . History of hiatal hernia   . History of rheumatic fever   . Hypertension   . Kidney stone   . PAF (paroxysmal atrial fibrillation) ([place])    a. s/p TEE/DCCV 07/2015. b. H/o bleeding on Coumadin when INR >5, changed to Eliquis.  . S/P Minimally invasive maze operation for atrial fibrillation 10/22/2015   Complete bilateral atrial lesion set using cryothermy and bipolar radiofrequency ablation with clipping of LA appendage via right mini thoracotomy approach  . S/P minimally invasive mitral valve repair 10/22/2015   Complex valvuloplasty including triangular resection of posterior leaflet, artificial Gore-tex neochord placement x6 and 38 mm Sorin Memo 3D Rechord ring annuloplasty via right minithoracotomy approach  . Severe mitral regurgitation    a. s/p MV repair 10/2015.  [name] Sleep apnea   . Stroke ([place])   . Thyroid disorder   . TIA (transient ischemic  attack)   . Tobacco abuse     PAST SURGICAL HISTORY: Past Surgical History:  Procedure Laterality Date  . ANKLE SURGERY    . AV NODE ABLATION N/A 05/04/2018   Procedure: AV NODE ABLATION;  Surgeon: Thompson Grayer, MD;  Location: [place] [facility];  Service: Cardiovascular;  Laterality: N/A;  . BIV PACEMAKER INSERTION CRT-P N/A 05/03/2018   Procedure: BIV PACEMAKER INSERTION CRT-P;  Surgeon: Deboraha Sprang, MD;  Location: [place] [facility];  Service: Cardiovascular;  Laterality: N/A;  . CARDIAC CATHETERIZATION N/A 10/03/2015   Procedure: Right and Left Heart Cath and Coronary Angiography;  Surgeon: [name], MD;  Location: [place] [facility];  Service: Cardiovascular;  Laterality: N/A;  . COLONOSCOPY    . ELECTROPHYSIOLOGIC STUDY N/A 08/18/2015   Procedure: CARDIOVERSION;  Surgeon: Wellington Hampshire, MD;  Location: [facility];  Service: Cardiovascular;  Laterality: N/A;  . MINIMALLY INVASIVE  MAZE PROCEDURE N/A 10/22/2015   Procedure: MINIMALLY INVASIVE MAZE PROCEDURE;  Surgeon: Rexene Alberts, MD;  Location: [place];  Service: Open Heart Surgery;  Laterality: N/A;  . MITRAL VALVE REPAIR Right 10/22/2015   Procedure: MINIMALLY INVASIVE MITRAL VALVE REPAIR (MVR);  Surgeon: Rexene Alberts, MD;  Location: [place];  Service: Open Heart Surgery;  Laterality: Right;  . SINUS EXPLORATION    . TEE WITHOUT CARDIOVERSION N/A 08/18/2015   Procedure: TRANSESOPHAGEAL ECHOCARDIOGRAM (TEE);  Surgeon: Wellington Hampshire, MD;  Location: [facility];  Service: Cardiovascular;  Laterality: N/A;  . TEE WITHOUT CARDIOVERSION N/A 10/22/2015   Procedure: TRANSESOPHAGEAL ECHOCARDIOGRAM (TEE);  Surgeon: Rexene Alberts, MD;  Location: [place];  Service: Open Heart Surgery;  Laterality: N/A;    FAMILY HISTORY: Family History  Problem Relation Age of Onset  . Stroke Mother   . Irregular heart beat Mother   . Heart murmur Brother   . Pulmonary embolism Brother   . Hypertension Other     SOCIAL HISTORY:  Social  History   Socioeconomic History  . Marital status: Married    Spouse name: [name]  . Number of children: 3  . Years of education: GED  . Highest education level: Not on file  Occupational History  . Not on file  Social Needs  . Financial resource strain: Not hard at all  . Food insecurity:    Worry: Never true    Inability: Never true  . Transportation needs:    Medical: No    Non-medical: No  Tobacco Use  . Smoking status: Current Every Day Smoker    Packs/day: 0.50    Years: 52.00    Pack years: 26.00    Types: Cigarettes    Last attempt to quit: 02/13/2018    Years since quitting: 1.0  . Smokeless tobacco: Never Used  . Tobacco comment: Resumed smoking, after quit 01/2018  Substance and Sexual Activity  . Alcohol use: No    Alcohol/week: 1.0 standard drinks    Types: 1 Standard drinks or equivalent per week    Comment: rare  . Drug use: No  . Sexual activity: Not on file  Lifestyle  . Physical activity:    Days per week: 0 days    Minutes per session: 0 min  . Stress: Not at all  Relationships  . Social connections:    Talks on phone: More than three times a week    Gets together: Never    Attends religious service: Never    Active member of club or organization: No    Attends meetings of clubs or organizations: Never    Relationship status: Married  . Intimate partner violence:    Fear of current or ex partner: No    Emotionally abused: No    Physically abused: No    Forced sexual activity: No  Other Topics Concern  . Not on file  Social History Narrative   Right handed    2-3 cups coffee per day     PHYSICAL EXAM  Vitals:   02/14/19 1607  SpO2: 98%  Weight: 203 lb 8 oz (92.3 kg)  Height: 5\' 10"  (1.778 m)    Body mass index is 29.2 kg/m.   Blood pressure was 135/90 pulse 78 supine, 136/91 pulse 80 sitting, 129/92 pulse 79 standing  General: The patient is well-developed and well-nourished and in no acute distress  Neck: The neck is  supple, no carotid bruits are noted.  The neck is  nontender.  Cardiovascular: The heart has a regular rate and rhythm with a normal S1 and S2. There were no murmurs, gallops or rubs. Lungs are clear to auscultation.  Skin: Extremities are without significant edema.  Musculoskeletal:  Back is nontender  Neurologic Exam  Mental status: The patient is alert and oriented x 3 at the time of the examination. The patient has apparent normal recent and mild reduced remote memory (2/3 but 3/3 with category prompt), with an apparently normal attention span and concentration ability ([id number] fast).   Clock numbers were ok and hands ok.[name]   He was able to continue a geometric pattern.   [name]   Speech is normal.  Cranial nerves: Extraocular movements are full.  Left pupil reacts normally to light and has normal vision.  Right has light perception and hand waving vision.  Facial symmetry is present. There is reduced right  facial sensation to soft touch and temperature.Facial strength is normal.  Trapezius and sternocleidomastoid strength is normal. No dysarthria is noted.  The tongue is midline, and the patient has symmetric elevation of the soft palate. reduced right hearing but Weber lateralized right.  Motor:  Muscle bulk is normal.   Tone is normal. Strength is  5 / 5 in all 4 extremities.   Sensory: Sensory testing shows mildly reduced touch in the left arm but symmetric temperature and vibration.    Coordination: Cerebellar testing reveals good finger-nose-finger and heel-to-shin bilaterally.  Gait and station: Station is normal.   Gait is normal. Tandem gait is normal. Romberg is negative.   Reflexes: Deep tendon reflexes are symmetric and normal bilaterally.   Plantar responses are flexor.    DIAGNOSTIC DATA (LABS, IMAGING, TESTING) - I reviewed patient records, labs, notes, testing and imaging myself where available.  Lab Results  Component Value Date   WBC 5.7 05/04/2018   HGB  14.8 05/04/2018   HCT 45.7 05/04/2018   MCV 93.3 05/04/2018   PLT 158 05/04/2018      Component Value Date/Time   NA 140 11/07/2018 1233   NA 142 01/24/2015 1849   K 4.4 11/07/2018 1233   K 4.2 01/24/2015 1849   CL 107 (H) 11/07/2018 1233   CL 104 01/24/2015 1849   CO2 18 (L) 11/07/2018 1233   CO2 28 01/24/2015 1849   GLUCOSE 92 11/07/2018 1233   GLUCOSE 166 (H) 05/05/2018 1016   GLUCOSE 106 (H) 01/24/2015 1849   BUN 9 11/07/2018 1233   BUN 12 01/24/2015 1849   CREATININE 0.97 11/07/2018 1233   CREATININE 1.12 01/24/2015 1849   CREATININE 1.10 05/31/2011 1049   CALCIUM 8.9 11/07/2018 1233   CALCIUM 10.2 (H) 01/24/2015 1849   PROT 6.4 11/07/2018 1233   PROT 7.6 01/24/2015 1849   ALBUMIN 4.0 11/07/2018 1233   ALBUMIN 3.7 01/24/2015 1849   AST 11 11/07/2018 1233   AST 13 (L) 01/24/2015 1849   ALT 10 11/07/2018 1233   ALT 19 01/24/2015 1849   ALKPHOS 80 11/07/2018 1233   ALKPHOS 83 01/24/2015 1849   BILITOT 0.7 11/07/2018 1233   BILITOT 0.4 01/24/2015 1849   GFRNONAA 76 11/07/2018 1233   GFRNONAA >60 01/24/2015 1849   GFRAA 88 11/07/2018 1233   GFRAA >60 01/24/2015 1849   Lab Results  Component Value Date   CHOL 215 (H) 05/01/2018   HDL 42 05/01/2018   LDLCALC 157 (H) 05/01/2018   TRIG 79 05/01/2018   CHOLHDL 5.1 05/01/2018   Lab Results  Component Value Date  HGBA1C 5.5 05/01/2018   No results found for: VITAMINB12 Lab Results  Component Value Date   TSH 3.960 11/07/2018       ASSESSMENT AND PLAN  Memory loss - Plan: CT HEAD WO CONTRAST, Vitamin B12, VITAMIN D 25 Hydroxy (Vit-D Deficiency, Fractures), TSH, Vitamin B1  Other peripheral vertigo, unspecified ear - Plan: CT HEAD WO CONTRAST, Vitamin B12, VITAMIN D 25 Hydroxy (Vit-D Deficiency, Fractures), TSH, Vitamin B1  Lightheadedness   In summary, Michael Delacruz is a 78 year old man with a complicated cardiac history.  In May 2019 he had a pacemaker and defibrillator placed had confusion and  left-sided numbness afterwards.  CT scan showed some air probably from the procedure but there did not appear to be any acute stroke.  The etiology of his current symptoms are unclear but could be related to cardiac issues.  I will check a CT scan to determine if there is any evidence of stroke or other abnormality that might be explaining the symptoms.  We will also check blood work to look for treatable causes of memory difficulties.  He will return to see me in 3 months or sooner if there are new or worsening neurologic symptoms.  Thank you for asking me to see Michael Delacruz.  Please let me know if I can be of further assistance with him or other patients in the future.   Michael A. Felecia Shelling, MD, [facility] 1/69/6789, 3:81 PM Certified in Neurology, Clinical Neurophysiology, Sleep Medicine, Pain Medicine and Neuroimaging  [facility] [address], [place], [place] [address] [phone]

## 2019-02-15 ENCOUNTER — Telehealth: Payer: Self-pay | Admitting: Neurology

## 2019-02-15 NOTE — Telephone Encounter (Signed)
Michael Delacruz: [id number] (exp. 02/15/19 to 03/17/19) order sent to GI. They will reach out to the pt to schedule.

## 2019-02-17 LAB — VITAMIN B12: Vitamin B-12: 591 pg/mL (ref 232–1245)

## 2019-02-17 LAB — TSH: TSH: 3.72 u[IU]/mL (ref 0.450–4.500)

## 2019-02-17 LAB — VITAMIN B1: Thiamine: 120.9 nmol/L (ref 66.5–200.0)

## 2019-02-17 LAB — VITAMIN D 25 HYDROXY (VIT D DEFICIENCY, FRACTURES): Vit D, 25-Hydroxy: 25.4 ng/mL — ABNORMAL LOW (ref 30.0–100.0)

## 2019-02-19 ENCOUNTER — Telehealth: Payer: Self-pay | Admitting: *Deleted

## 2019-02-19 ENCOUNTER — Ambulatory Visit
Admission: RE | Admit: 2019-02-19 | Discharge: 2019-02-19 | Disposition: A | Discharge status: Home / Self Care | Payer: Medicare HMO | Source: Ambulatory Visit | Attending: Neurology | Admitting: Neurology

## 2019-02-19 ENCOUNTER — Encounter: Payer: Self-pay | Admitting: *Deleted

## 2019-02-19 DIAGNOSIS — H81399 Other peripheral vertigo, unspecified ear: Secondary | ICD-10-CM

## 2019-02-19 DIAGNOSIS — R413 Other amnesia: Secondary | ICD-10-CM

## 2019-02-19 NOTE — Telephone Encounter (Signed)
-----   Message from Britt Bottom, MD sent at 02/18/2019  4:54 PM EST ----- Please let him know that the vitamin D is low.  The other labs are normal.  He should take over-the-counter 5000 units daily.

## 2019-02-19 NOTE — Telephone Encounter (Signed)
I called and spoke with pt about lab results per Dr. Felecia Shelling note. He verbalized understanding. He has CT scan today at 11am. Advised we will call once those results available.

## 2019-02-20 ENCOUNTER — Telehealth: Payer: Self-pay | Admitting: *Deleted

## 2019-02-20 NOTE — Telephone Encounter (Signed)
-----   Message from Britt Bottom, MD sent at 02/20/2019  9:45 AM EST ----- Please let him know that the CT scan looked unchanged from the one he had last year.   He has age-related changes but no strokes.

## 2019-02-20 NOTE — Telephone Encounter (Signed)
I called and spoke with pt about results per Dr. Felecia Shelling note. He verbalized understanding and appreciation.

## 2019-02-26 ENCOUNTER — Telehealth: Payer: Self-pay | Admitting: Family Medicine

## 2019-02-26 ENCOUNTER — Other Ambulatory Visit: Payer: Self-pay | Admitting: Internal Medicine

## 2019-02-26 NOTE — Telephone Encounter (Signed)
Called patient to remind him of his [employer] Appointment tomorrow, February 27, 2019 at 3:00 PM.  Patient was reached and appointment was confirmed.  Janace Hoard, Care Guide.

## 2019-02-27 ENCOUNTER — Ambulatory Visit (INDEPENDENT_AMBULATORY_CARE_PROVIDER_SITE_OTHER): Payer: Self-pay

## 2019-02-27 VITALS — BP 123/73 | HR 78 | Temp 97.8°F | Resp 16 | Ht 71.0 in | Wt 206.0 lb

## 2019-02-27 DIAGNOSIS — Z0000 Encounter for general adult medical examination without abnormal findings: Secondary | ICD-10-CM

## 2019-02-27 NOTE — Patient Instructions (Addendum)
Michael Delacruz , Thank you for taking time to come for your Medicare Wellness Visit. I appreciate your ongoing commitment to your health goals. Please review the following plan we discussed and let me know if I can assist you in the future.   Screening recommendations/referrals: Colonoscopy: declined Recommended yearly ophthalmology/optometry visit for glaucoma screening and checkup Recommended yearly dental visit for hygiene and checkup  Vaccinations: Influenza vaccine: declined Pneumococcal vaccine: declined Tdap vaccine: due, check with your insurance company for coverage  Shingles vaccine: Shingrix eligible, check with your insurance company for coverage   Advanced directives: Advance directive discussed with you today. I have provided a copy for you to complete at home and have notarized. Once this is complete please bring a copy in to our office so we can scan it into your chart.  Conditions/risks identified: smoking- not ready to quit   Next appointment: Follow up with Dr.Karamalegos. Please schedule an appt.  Follow up in one year for your medicare wellness visit.   Preventive Care 90 Years and Older, Male Preventive care refers to lifestyle choices and visits with your health care provider that can promote health and wellness. What does preventive care include?  A yearly physical exam. This is also called an annual well check.  Dental exams once or twice a year.  Routine eye exams. Ask your health care provider how often you should have your eyes checked.  Personal lifestyle choices, including:  Daily care of your teeth and gums.  Regular physical activity.  Eating a healthy diet.  Avoiding tobacco and drug use.  Limiting alcohol use.  Practicing safe sex.  Taking low doses of aspirin every day.  Taking vitamin and mineral supplements as recommended by your health care provider. What happens during an annual well check? The services and screenings done by your  health care provider during your annual well check will depend on your age, overall health, lifestyle risk factors, and family history of disease. Counseling  Your health care provider may ask you questions about your:  Alcohol use.  Tobacco use.  Drug use.  Emotional well-being.  Home and relationship well-being.  Sexual activity.  Eating habits.  History of falls.  Memory and ability to understand (cognition).  Work and work Statistician. Screening  You may have the following tests or measurements:  Height, weight, and BMI.  Blood pressure.  Lipid and cholesterol levels. These may be checked every 5 years, or more frequently if you are over 69 years old.  Skin check.  Lung cancer screening. You may have this screening every year starting at age 34 if you have a 30-pack-year history of smoking and currently smoke or have quit within the past 15 years.  Fecal occult blood test (FOBT) of the stool. You may have this test every year starting at age 67.  Flexible sigmoidoscopy or colonoscopy. You may have a sigmoidoscopy every 5 years or a colonoscopy every 10 years starting at age 67.  Prostate cancer screening. Recommendations will vary depending on your family history and other risks.  Hepatitis C blood test.  Hepatitis B blood test.  Sexually transmitted disease (STD) testing.  Diabetes screening. This is done by checking your blood sugar (glucose) after you have not eaten for a while (fasting). You may have this done every 1-3 years.  Abdominal aortic aneurysm (AAA) screening. You may need this if you are a current or former smoker.  Osteoporosis. You may be screened starting at age 48 if you are at  high risk. Talk with your health care provider about your test results, treatment options, and if necessary, the need for more tests. Vaccines  Your health care provider may recommend certain vaccines, such as:  Influenza vaccine. This is recommended every  year.  Tetanus, diphtheria, and acellular pertussis (Tdap, Td) vaccine. You may need a Td booster every 10 years.  Zoster vaccine. You may need this after age 60.  Pneumococcal 13-valent conjugate (PCV13) vaccine. One dose is recommended after age 31.  Pneumococcal polysaccharide (PPSV23) vaccine. One dose is recommended after age 67. Talk to your health care provider about which screenings and vaccines you need and how often you need them. This information is not intended to replace advice given to you by your health care provider. Make sure you discuss any questions you have with your health care provider. Document Released: 01/02/2016 Document Revised: 08/25/2016 Document Reviewed: 10/07/2015 Elsevier Interactive Patient Education  2017 [place] Prevention in the Home Falls can cause injuries. They can happen to people of all ages. There are many things you can do to make your home safe and to help prevent falls. What can I do on the outside of my home?  Regularly fix the edges of walkways and driveways and fix any cracks.  Remove anything that might make you trip as you walk through a door, such as a raised step or threshold.  Trim any bushes or trees on the path to your home.  Use bright outdoor lighting.  Clear any walking paths of anything that might make someone trip, such as rocks or tools.  Regularly check to see if handrails are loose or broken. Make sure that both sides of any steps have handrails.  Any raised decks and porches should have guardrails on the edges.  Have any leaves, snow, or ice cleared regularly.  Use sand or salt on walking paths during winter.  Clean up any spills in your garage right away. This includes oil or grease spills. What can I do in the bathroom?  Use night lights.  Install grab bars by the toilet and in the tub and shower. Do not use towel bars as grab bars.  Use non-skid mats or decals in the tub or shower.  If you  need to sit down in the shower, use a plastic, non-slip stool.  Keep the floor dry. Clean up any water that spills on the floor as soon as it happens.  Remove soap buildup in the tub or shower regularly.  Attach bath mats securely with double-sided non-slip rug tape.  Do not have throw rugs and other things on the floor that can make you trip. What can I do in the bedroom?  Use night lights.  Make sure that you have a light by your bed that is easy to reach.  Do not use any sheets or blankets that are too big for your bed. They should not hang down onto the floor.  Have a firm chair that has side arms. You can use this for support while you get dressed.  Do not have throw rugs and other things on the floor that can make you trip. What can I do in the kitchen?  Clean up any spills right away.  Avoid walking on wet floors.  Keep items that you use a lot in easy-to-reach places.  If you need to reach something above you, use a strong step stool that has a grab bar.  Keep electrical cords out of the  way.  Do not use floor polish or wax that makes floors slippery. If you must use wax, use non-skid floor wax.  Do not have throw rugs and other things on the floor that can make you trip. What can I do with my stairs?  Do not leave any items on the stairs.  Make sure that there are handrails on both sides of the stairs and use them. Fix handrails that are broken or loose. Make sure that handrails are as long as the stairways.  Check any carpeting to make sure that it is firmly attached to the stairs. Fix any carpet that is loose or worn.  Avoid having throw rugs at the top or bottom of the stairs. If you do have throw rugs, attach them to the floor with carpet tape.  Make sure that you have a light switch at the top of the stairs and the bottom of the stairs. If you do not have them, ask someone to add them for you. What else can I do to help prevent falls?  Wear shoes  that:  Do not have high heels.  Have rubber bottoms.  Are comfortable and fit you well.  Are closed at the toe. Do not wear sandals.  If you use a stepladder:  Make sure that it is fully opened. Do not climb a closed stepladder.  Make sure that both sides of the stepladder are locked into place.  Ask someone to hold it for you, if possible.  Clearly mark and make sure that you can see:  Any grab bars or handrails.  First and last steps.  Where the edge of each step is.  Use tools that help you move around (mobility aids) if they are needed. These include:  Canes.  Walkers.  Scooters.  Crutches.  Turn on the lights when you go into a dark area. Replace any light bulbs as soon as they burn out.  Set up your furniture so you have a clear path. Avoid moving your furniture around.  If any of your floors are uneven, fix them.  If there are any pets around you, be aware of where they are.  Review your medicines with your doctor. Some medicines can make you feel dizzy. This can increase your chance of falling. Ask your doctor what other things that you can do to help prevent falls. This information is not intended to replace advice given to you by your health care provider. Make sure you discuss any questions you have with your health care provider. Document Released: 10/02/2009 Document Revised: 05/13/2016 Document Reviewed: 01/10/2015 Elsevier Interactive Patient Education  2017 [employer].

## 2019-02-27 NOTE — Progress Notes (Signed)
Subjective:   Michael Delacruz is a 78 y.o. male who presents for Medicare Annual/Subsequent preventive examination.  Review of Systems:   Cardiac Risk Factors include: advanced age (>25men, >37 women);hypertension;male gender;dyslipidemia     Objective:    Vitals: BP 123/73 (BP Location: Left Arm, Patient Position: Sitting, Cuff Size: Normal)   Pulse 78   Temp 97.8 F (36.6 C) (Oral)   Resp 16   Ht 5\' 11"  (1.803 m)   Wt 206 lb (93.4 kg)   BMI 28.73 kg/m   Body mass index is 28.73 kg/m.  Advanced Directives 02/27/2019 02/27/2019 05/03/2018 04/30/2018 04/30/2018 02/21/2018 08/31/2017  Does Patient Have a Medical Advance Directive? No Yes No No No No No  Does patient want to make changes to medical advance directive? Yes (MAU/Ambulatory/Procedural Areas - Information given) Yes (MAU/Ambulatory/Procedural Areas - Information given) - - - - -  Would patient like information on creating a medical advance directive? - - No - Patient declined No - Patient declined No - Patient declined No - Patient declined -   A voluntary discussion about advance care planning including the explanation and discussion of advance directives was extensively discussed  with the patient.  Explanation about the health care proxy and Living will was reviewed and packet with forms with explanation of how to fill them out was given.  During this discussion, the patient was able to identify a health care proxy as spouse and plans to fill out the paperwork required.  Patient was offered a separate [place] visit for further assistance with forms.   Time spent with patient: Individuals present: Newman Nickels (patient), Tiffany Hill,LPN    Tobacco Social History   Tobacco Use  Smoking Status Current Every Day Smoker  . Packs/day: 0.50  . Years: 52.00  . Pack years: 26.00  . Types: Cigarettes  . Last attempt to quit: 02/13/2018  . Years since quitting: 1.0  Smokeless Tobacco Never Used  Tobacco  Comment   Resumed smoking, after quit 01/2018     Ready to quit: No Counseling given: Yes Comment: Resumed smoking, after quit 01/2018   Clinical Intake:  Pre-visit preparation completed: Yes  Pain : 0-10 Pain Score: 2  Pain Type: Acute pain Pain Location: Head Pain Orientation: Left, Right Pain Descriptors / Indicators: Aching Pain Onset: More than a month ago Pain Frequency: Intermittent Pain Relieving Factors: hsant tried any medications   Pain Relieving Factors: hsant tried any medications      How often do you need to have someone help you when you read instructions, pamphlets, or other written materials from your doctor or pharmacy?: 1 - Never What is the last grade level you completed in school?: GED   Interpreter Needed?: No  Information entered by :: Tiffany Hill,LPN   Past Medical History:  Diagnosis Date  . Arthritis   . Asthma   . Bradycardia   . CAD in native artery    a. LHC 09/2015: 40% pCx, 35% mRCA.  [name] COPD (chronic obstructive pulmonary disease) ([place])   . Dilation of intestine 01/2015  . Fibromyalgia   . Frequent headaches   . GERD (gastroesophageal reflux disease)   . History of blood clots    eye   . History of hiatal hernia   . History of rheumatic fever   . Hypertension   . Kidney stone   . PAF (paroxysmal atrial fibrillation) ([place])    a. s/p TEE/DCCV 07/2015. b. H/o bleeding on Coumadin when INR >  5, changed to Eliquis.  . S/P Minimally invasive maze operation for atrial fibrillation 10/22/2015   Complete bilateral atrial lesion set using cryothermy and bipolar radiofrequency ablation with clipping of LA appendage via right mini thoracotomy approach  . S/P minimally invasive mitral valve repair 10/22/2015   Complex valvuloplasty including triangular resection of posterior leaflet, artificial Gore-tex neochord placement x6 and 38 mm Sorin Memo 3D Rechord ring annuloplasty via right minithoracotomy approach  . Severe mitral regurgitation    a.  s/p MV repair 10/2015.  [name] Sleep apnea   . Stroke ([place])   . Thyroid disorder   . TIA (transient ischemic attack)   . Tobacco abuse    Past Surgical History:  Procedure Laterality Date  . ANKLE SURGERY    . AV NODE ABLATION N/A 05/04/2018   Procedure: AV NODE ABLATION;  Surgeon: Thompson Grayer, MD;  Location: [place] [facility];  Service: Cardiovascular;  Laterality: N/A;  . BIV PACEMAKER INSERTION CRT-P N/A 05/03/2018   Procedure: BIV PACEMAKER INSERTION CRT-P;  Surgeon: Deboraha Sprang, MD;  Location: [facility];  Service: Cardiovascular;  Laterality: N/A;  . CARDIAC CATHETERIZATION N/A 10/03/2015   Procedure: Right and Left Heart Cath and Coronary Angiography;  Surgeon: [name], MD;  Location: [facility];  Service: Cardiovascular;  Laterality: N/A;  . COLONOSCOPY    . ELECTROPHYSIOLOGIC STUDY N/A 08/18/2015   Procedure: CARDIOVERSION;  Surgeon: Wellington Hampshire, MD;  Location: [facility];  Service: Cardiovascular;  Laterality: N/A;  . MINIMALLY INVASIVE MAZE PROCEDURE N/A 10/22/2015   Procedure: MINIMALLY INVASIVE MAZE PROCEDURE;  Surgeon: Rexene Alberts, MD;  Location: [facility];  Service: Open Heart Surgery;  Laterality: N/A;  . MITRAL VALVE REPAIR Right 10/22/2015   Procedure: MINIMALLY INVASIVE MITRAL VALVE REPAIR (MVR);  Surgeon: Rexene Alberts, MD;  Location: [facility];  Service: Open Heart Surgery;  Laterality: Right;  . SINUS EXPLORATION    . TEE WITHOUT CARDIOVERSION N/A 08/18/2015   Procedure: TRANSESOPHAGEAL ECHOCARDIOGRAM (TEE);  Surgeon: Wellington Hampshire, MD;  Location: [facility];  Service: Cardiovascular;  Laterality: N/A;  . TEE WITHOUT CARDIOVERSION N/A 10/22/2015   Procedure: TRANSESOPHAGEAL ECHOCARDIOGRAM (TEE);  Surgeon: Rexene Alberts, MD;  Location: [facility];  Service: Open Heart Surgery;  Laterality: N/A;   Family History  Problem Relation Age of Onset  . Stroke Mother   . Irregular heart beat Mother   . Heart murmur Brother   . Pulmonary embolism  Brother   . Hypertension Other   . Pulmonary embolism Maternal Uncle    Social History   Socioeconomic History  . Marital status: Married    Spouse name: [name]  . Number of children: 3  . Years of education: GED  . Highest education level: GED or equivalent  Occupational History  . Not on file  Social Needs  . Financial resource strain: Very hard  . Food insecurity:    Worry: Often true    Inability: Often true  . Transportation needs:    Medical: No    Non-medical: No  Tobacco Use  . Smoking status: Current Every Day Smoker    Packs/day: 0.50    Years: 52.00    Pack years: 26.00    Types: Cigarettes    Last attempt to quit: 02/13/2018    Years since quitting: 1.0  . Smokeless tobacco: Never Used  . Tobacco comment: Resumed smoking, after quit 01/2018  Substance and Sexual Activity  . Alcohol use: No  Alcohol/week: 1.0 standard drinks    Types: 1 Standard drinks or equivalent per week    Comment: occasionally drinks a margarita  . Drug use: No  . Sexual activity: Not on file  Lifestyle  . Physical activity:    Days per week: 0 days    Minutes per session: 0 min  . Stress: Not at all  Relationships  . Social connections:    Talks on phone: Once a week    Gets together: More than three times a week    Attends religious service: Never    Active member of club or organization: No    Attends meetings of clubs or organizations: Never    Relationship status: Married  Other Topics Concern  . Not on file  Social History Narrative   Right handed    2-3 cups coffee per day          Outpatient Encounter Medications as of 02/27/2019  Medication Sig  . ALPRAZolam (XANAX) 1 MG tablet Take 0.5 tablets (0.5 mg total) by mouth 2 (two) times daily as needed for anxiety. May only take once if needed, as this is higher dose  . carvedilol (COREG) 6.25 MG tablet Take 0.5 tablets (3.125 mg total) by mouth 2 (two) times daily with a meal. (Patient taking differently: Take  6.25 mg by mouth 2 (two) times daily with a meal. )  . furosemide (LASIX) 40 MG tablet TAKE 1 TABLET BY MOUTH TWICE A DAY AS NEEDED  . albuterol (PROVENTIL HFA;VENTOLIN HFA) 108 (90 Base) MCG/ACT inhaler Inhale 2 puffs into the lungs every 6 (six) hours as needed for wheezing or shortness of breath. (Patient not taking: Reported on 02/27/2019)  . fluticasone furoate-vilanterol (BREO ELLIPTA) 100-25 MCG/INH AEPB Inhale 1 puff into the lungs daily. (Patient not taking: Reported on 02/27/2019)  . [DISCONTINUED] polyethylene glycol powder (GLYCOLAX/MIRALAX) powder Take 17 g by mouth daily as needed for mild constipation or moderate constipation. Start 1 cap for few days, if need increase to 2 caps (Patient not taking: Reported on 02/27/2019)  . [DISCONTINUED] VITAMIN D PO Take 5,000 Units by mouth daily.   No facility-administered encounter medications on file as of 02/27/2019.     Activities of Daily Living In your present state of health, do you have any difficulty performing the following activities: 02/27/2019 05/03/2018  Hearing? N -  Vision? Y -  Comment refuses eye doctor  -  Difficulty concentrating or making decisions? Y -  Walking or climbing stairs? Y -  Comment has probelms  -  Dressing or bathing? N -  Doing errands, shopping? N N  Preparing Food and eating ? N -  Using the Toilet? N -  In the past six months, have you accidently leaked urine? N -  Do you have problems with loss of bowel control? N -  Managing your Medications? N -  Managing your Finances? N -  Housekeeping or managing your Housekeeping? N -  Some recent data might be hidden    Patient Care Team: [name], DO as PCP - General (Family Medicine) [name], MD as Consulting Physician (Cardiology) Abbie Sons, MD (Urology)   Assessment:   This is a routine wellness examination for Michael Delacruz.  Exercise Activities and Dietary recommendations Current Exercise Habits: The patient does not  participate in regular exercise at present, Exercise limited by: cardiac condition(s)  Goals    . DIET - INCREASE WATER INTAKE     recommend drinking at least  6-9 glasses of water a day     . Exercise 3x per week (30 min per time)     Walk 2-3 times a week for 20-30 minutes.        Fall Risk: Fall Risk  02/27/2019 02/21/2018  Falls in the past year? 1 No  Number falls in past yr: 1 -  Comment 3-4 -  Injury with Fall? 1 -  Risk for fall due to : Impaired balance/gait -  Follow up Falls evaluation completed;Falls prevention discussed;Education provided -    FALL RISK PREVENTION PERTAINING TO THE HOME:  Any stairs in or around the home? Yes  If so, are there any without handrails? No  handrails are unsteady   Home free of loose throw rugs in walkways, pet beds, electrical cords, etc? Yes  Adequate lighting in your home to reduce risk of falls? Yes   ASSISTIVE DEVICES UTILIZED TO PREVENT FALLS:  Life alert? No  Use of a cane, walker or w/c? No  Grab bars in the bathroom? No  Shower chair or bench in shower? No  Elevated toilet seat or a handicapped toilet? No   TIMED UP AND GO:  Was the test performed? Yes .  Length of time to ambulate 10 feet: 11 sec.   GAIT:  Appearance of gait: Gait steady and fast without the use of an assistive device.  Education: Fall risk prevention has been discussed.  Intervention(s) required? Yes  DME/home health order needed?  No   Depression Screen PHQ 2/9 Scores 02/27/2019 01/28/2019 03/27/2018 02/21/2018  PHQ - 2 Score 5 4 4 4   PHQ- 9 Score 18 15 15 11     Cognitive Function     6CIT Screen 02/27/2019 02/21/2018  What Year? 0 points 0 points  What month? 0 points 0 points  What time? 0 points 0 points  Count back from 20 0 points 0 points  Months in reverse 2 points 0 points  Repeat phrase 0 points 0 points  Total Score 2 0    There is no immunization history for the selected administration types on file for this patient.  Qualifies  for Shingles Vaccine? Yes  Zostavax completed n/a. Due for Shingrix. Education has been provided regarding the importance of this vaccine. Pt has been advised to call insurance company to determine out of pocket expense. Advised may also receive vaccine at local pharmacy or Health Dept. Verbalized acceptance and understanding.  Tdap: Although this vaccine is not a covered service during a Wellness Exam, does the patient still wish to receive this vaccine today?  No .  Education has been provided regarding the importance of this vaccine. Advised may receive this vaccine at local pharmacy or Health Dept. Aware to provide a copy of the vaccination record if obtained from local pharmacy or Health Dept. Verbalized acceptance and understanding.  Flu Vaccine: Due for Flu vaccine. Does the patient want to receive this vaccine today?  No . Education has been provided regarding the importance of this vaccine but still declined. Advised may receive this vaccine at local pharmacy or Health Dept. Aware to provide a copy of the vaccination record if obtained from local pharmacy or Health Dept. Verbalized acceptance and understanding.  Pneumococcal Vaccine: Due for Pneumococcal vaccine. Does the patient want to receive this vaccine today?  No . Education has been provided regarding the importance of this vaccine but still declined. Advised may receive this vaccine at local pharmacy or Health Dept. Aware to provide a copy  of the vaccination record if obtained from local pharmacy or Health Dept. Verbalized acceptance and understanding.   Screening Tests Health Maintenance  Topic Date Due  . COLONOSCOPY  05/15/1993  . INFLUENZA VACCINE  03/20/2019 (Originally 07/20/2018)  . TETANUS/TDAP  01/29/2020 (Originally [birth year]1962)  . PNA vac Low Risk Adult (1 of 2 - PCV13) 02/27/2020 (Originally 05/15/2008)   Cancer Screenings:  Colorectal Screening: declined all screenings.   Lung Cancer Screening: (Low Dose CT Chest  recommended if Age 89-80 years, 30 pack-year currently smoking OR have quit w/in 15years.) does not qualify.    Additional Screening:  Hepatitis C Screening: does not qualify  Vision Screening: Recommended annual ophthalmology exams for early detection of glaucoma and other disorders of the eye. Is the patient up to date with their annual eye exam?  No  If pt is not established with a provider, would they like to be referred to a provider to establish care? . no  Dental Screening: Recommended annual dental exams for proper oral hygiene  Community Resource Referral:  CRR required this visit?  Yes   Patient needs financial assistance, rehousing.       Plan:  I have personally reviewed and addressed the Medicare Annual Wellness questionnaire and have noted the following in the patient's chart:  A. Medical and social history B. Use of alcohol, tobacco or illicit drugs  C. Current medications and supplements D. Functional ability and status E.  Nutritional status F.  Physical activity G. Advance directives H. List of other physicians I.  Hospitalizations, surgeries, and ER visits in previous 12 months J.  [place] such as hearing and vision if needed, cognitive and depression L. Referrals and appointments   In addition, I have reviewed and discussed with patient certain preventive protocols, quality metrics, and best practice recommendations. A written personalized care plan for preventive services as well as general preventive health recommendations were provided to patient.   Signed,   Bevelyn Ngo, LPN  1/61/0960 Nurse Health Advisor   Nurse Notes:  PHQ-9 score was 18 today. Currently on xanax but states he only takes .25mg  once in the morning. Declines counseling at this time but agrees to speak with Dr.Karamalegos about his depression.    Patient talking about coming off carvedilol. Advised to schedule appt with Dr.karamalegos.   Dizziness, headaches,  memory issues- he is seeing neurologist for this but feels it isnt any better since that appt.   Advised to schedule appt.   Advanced care planning encounter completed.

## 2019-02-28 ENCOUNTER — Telehealth: Payer: Self-pay | Admitting: Family Medicine

## 2019-03-01 ENCOUNTER — Other Ambulatory Visit: Payer: Self-pay | Admitting: Family Medicine

## 2019-03-01 DIAGNOSIS — G4701 Insomnia due to medical condition: Secondary | ICD-10-CM

## 2019-03-01 NOTE — Telephone Encounter (Signed)
Pt needs refills on xanax sent to [facility]

## 2019-03-01 NOTE — Telephone Encounter (Signed)
Please review. Thanks!  

## 2019-03-02 MED ORDER — ALPRAZOLAM 0.25 MG PO TABS: 0.2500 mg | ORAL_TABLET | Freq: Two times a day (BID) | ORAL | 2 refills | Status: DC | PRN

## 2019-03-02 NOTE — Telephone Encounter (Signed)
° ° °  Called Pt regarding Community Resource Referral for: food and financial assistance see detailed notes in Referral.   [place]  ??[email or web address]  ?? [phone]   Skype

## 2019-03-06 ENCOUNTER — Encounter: Payer: Self-pay | Admitting: Family Medicine

## 2019-03-07 NOTE — Telephone Encounter (Signed)
° °  LVM to Follow up to [facility] Referral for pt for [facility] Referral, see updated referral notes.  [place]  ??[email or web address]   ??[phone]   1Skype

## 2019-03-09 DIAGNOSIS — Z7189 Other specified counseling: Secondary | ICD-10-CM | POA: Diagnosis not present

## 2019-03-09 DIAGNOSIS — I25118 Atherosclerotic heart disease of native coronary artery with other forms of angina pectoris: Secondary | ICD-10-CM | POA: Diagnosis not present

## 2019-03-09 DIAGNOSIS — H4311 Vitreous hemorrhage, right eye: Secondary | ICD-10-CM | POA: Diagnosis not present

## 2019-03-09 DIAGNOSIS — Z8669 Personal history of other diseases of the nervous system and sense organs: Secondary | ICD-10-CM | POA: Diagnosis not present

## 2019-03-09 DIAGNOSIS — Z09 Encounter for follow-up examination after completed treatment for conditions other than malignant neoplasm: Secondary | ICD-10-CM | POA: Diagnosis not present

## 2019-04-09 DIAGNOSIS — E261 Secondary hyperaldosteronism: Secondary | ICD-10-CM | POA: Diagnosis not present

## 2019-04-09 DIAGNOSIS — J449 Chronic obstructive pulmonary disease, unspecified: Secondary | ICD-10-CM | POA: Diagnosis not present

## 2019-04-09 DIAGNOSIS — F17200 Nicotine dependence, unspecified, uncomplicated: Secondary | ICD-10-CM | POA: Diagnosis not present

## 2019-04-09 DIAGNOSIS — I509 Heart failure, unspecified: Secondary | ICD-10-CM | POA: Diagnosis not present

## 2019-04-26 ENCOUNTER — Telehealth: Payer: Self-pay | Admitting: Cardiovascular Disease

## 2019-04-26 NOTE — Telephone Encounter (Signed)
Virtual Visit Pre-Appointment Phone Call  "(Name), I am calling you today to discuss your upcoming appointment. We are currently trying to limit exposure to the virus that causes COVID-19 by seeing patients at home rather than in the office."  1. "What is the BEST phone number to call the day of the visit?" - include this in appointment notes  2. Do you have or have access to (through a family member/friend) a smartphone with video capability that we can use for your visit?" a. If yes - list this number in appt notes as cell (if different from BEST phone #) and list the appointment type as a VIDEO visit in appointment notes b. If no - list the appointment type as a PHONE visit in appointment notes  3. Confirm consent - "In the setting of the current Covid19 crisis, you are scheduled for a (phone or video) visit with your provider on (date) at (time).  Just as we do with many in-office visits, in order for you to participate in this visit, we must obtain consent.  If you'd like, I can send this to your mychart (if signed up) or email for you to review.  Otherwise, I can obtain your verbal consent now.  All virtual visits are billed to your insurance company just like a normal visit would be.  By agreeing to a virtual visit, we'd like you to understand that the technology does not allow for your provider to perform an examination, and thus may limit your provider's ability to fully assess your condition. If your provider identifies any concerns that need to be evaluated in person, we will make arrangements to do so.  Finally, though the technology is pretty good, we cannot assure that it will always work on either your or our end, and in the setting of a video visit, we may have to convert it to a phone-only visit.  In either situation, we cannot ensure that we have a secure connection.  Are you willing to proceed?" STAFF: Did the patient verbally acknowledge consent to telehealth visit? Document  YES/NO here: yes  4. Advise patient to be prepared - "Two hours prior to your appointment, go ahead and check your blood pressure, pulse, oxygen saturation, and your weight (if you have the equipment to check those) and write them all down. When your visit starts, your provider will ask you for this information. If you have an Apple Watch or Kardia device, please plan to have heart rate information ready on the day of your appointment. Please have a pen and paper handy nearby the day of the visit as well."  5. Give patient instructions for MyChart download to smartphone OR Doximity/Doxy.me as below if video visit (depending on what platform provider is using)  6. Inform patient they will receive a phone call 15 minutes prior to their appointment time (may be from unknown caller ID) so they should be prepared to answer    TELEPHONE CALL NOTE  TAILOR WESTFALL has been deemed a candidate for a follow-up tele-health visit to limit community exposure during the Covid-19 pandemic. I spoke with the patient via phone to ensure availability of phone/video source, confirm preferred email & phone number, and discuss instructions and expectations.  I reminded Michael Delacruz to be prepared with any vital sign and/or heart rhythm information that could potentially be obtained via home monitoring, at the time of his visit. I reminded Michael Delacruz to expect a phone call prior to  his visit.  Clarisse Gouge 04/26/2019 3:16 PM   INSTRUCTIONS FOR DOWNLOADING THE MYCHART APP TO SMARTPHONE  - The patient must first make sure to have activated MyChart and know their login information - If Apple, go to [employer] and type in MyChart in the search bar and download the app. If Android, ask patient to go to [employer] and type in [place] in the search bar and download the app. The app is free but as with any other app downloads, their phone may require them to verify saved payment information or  Apple/Android password.  - The patient will need to then log into the app with their MyChart username and password, and select [facility] as their healthcare provider to link the account. When it is time for your visit, go to the MyChart app, find appointments, and click Begin Video Visit. Be sure to Select Allow for your device to access the Microphone and Camera for your visit. You will then be connected, and your provider will be with you shortly.  **If they have any issues connecting, or need assistance please contact MyChart service desk [phone])**  **If using a computer, in order to ensure the best quality for their visit they will need to use either of the following Internet Browsers: [employer], or Google Chrome**  IF USING DOXIMITY or DOXY.ME - The patient will receive a link just prior to their visit by text.     FULL LENGTH CONSENT FOR TELE-HEALTH VISIT   I hereby voluntarily request, consent and authorize [facility] and its employed or contracted physicians, physician assistants, nurse practitioners or other licensed health care professionals (the Practitioner), to provide me with telemedicine health care services (the Services") as deemed necessary by the treating Practitioner. I acknowledge and consent to receive the Services by the Practitioner via telemedicine. I understand that the telemedicine visit will involve communicating with the Practitioner through live audiovisual communication technology and the disclosure of certain medical information by electronic transmission. I acknowledge that I have been given the opportunity to request an in-person assessment or other available alternative prior to the telemedicine visit and am voluntarily participating in the telemedicine visit.  I understand that I have the right to withhold or withdraw my consent to the use of telemedicine in the course of my care at any time, without affecting my right to future care  or treatment, and that the Practitioner or I may terminate the telemedicine visit at any time. I understand that I have the right to inspect all information obtained and/or recorded in the course of the telemedicine visit and may receive copies of available information for a reasonable fee.  I understand that some of the potential risks of receiving the Services via telemedicine include:   Delay or interruption in medical evaluation due to technological equipment failure or disruption;  Information transmitted may not be sufficient (e.g. poor resolution of images) to allow for appropriate medical decision making by the Practitioner; and/or   In rare instances, security protocols could fail, causing a breach of personal health information.  Furthermore, I acknowledge that it is my responsibility to provide information about my medical history, conditions and care that is complete and accurate to the best of my ability. I acknowledge that Practitioner's advice, recommendations, and/or decision may be based on factors not within their control, such as incomplete or inaccurate data provided by me or distortions of diagnostic images or specimens that may result from electronic transmissions. I  understand that the practice of medicine is not an exact science and that Practitioner makes no warranties or guarantees regarding treatment outcomes. I acknowledge that I will receive a copy of this consent concurrently upon execution via email to the email address I last provided but may also request a printed copy by calling the office of [facility].    I understand that my insurance will be billed for this visit.   I have read or had this consent read to me.  I understand the contents of this consent, which adequately explains the benefits and risks of the Services being provided via telemedicine.   I have been provided ample opportunity to ask questions regarding this consent and the Services and have had  my questions answered to my satisfaction.  I give my informed consent for the services to be provided through the use of telemedicine in my medical care  By participating in this telemedicine visit I agree to the above.

## 2019-04-29 NOTE — Progress Notes (Signed)
Virtual Visit via Video Note   This visit type was conducted due to national recommendations for restrictions regarding the COVID-19 Pandemic (e.g. social distancing) in an effort to limit this patient's exposure and mitigate transmission in our community.  Due to his co-morbid illnesses, this patient is at least at moderate risk for complications without adequate follow up.  This format is felt to be most appropriate for this patient at this time.  All issues noted in this document were discussed and addressed.  A limited physical exam was performed with this format.  Please refer to the patient's chart for his consent to telehealth for [facility].   I connected with  Michael Delacruz on 04/30/19 by a video enabled telemedicine application and verified that I am speaking with the correct person using two identifiers. I discussed the limitations of evaluation and management by telemedicine. The patient expressed understanding and agreed to proceed.   Evaluation Performed:  Follow-up visit  Date:  04/30/2019   ID:  Michael Delacruz, DOB 09-20-1941, MRN [id number]  Patient Location:  [place] [address]   Provider location:   [facility], [place] office  PCP:  [name], DO  Cardiologist:  Arvid Right [facility]   Chief Complaint:  weakness    History of Present Illness:    Michael Delacruz is a 78 y.o. male who presents via audio/video conferencing for a telehealth visit today.   The patient does not symptoms concerning for COVID-19 infection (fever, chills, cough, or new SHORTNESS OF BREATH).   Patient has a past medical history of long smoking history for 50 years with underlying COPD,  severe mitral valve regurgitation on echocardiogram,  prolapse of posterior leaflet,  moderate pulmonary hypertension  s/p successful MR repair by right thoracotomy by Dr. Roxy Manns 11/ 2016 Smokes 1 ppd PAF on warfarin had bleeding, now on asa,  s/p Maze Surgical report indicates clipping of left atrial appendage, Atricure left atrial clip, size 45 mm Previous history of retinal bleeding felt exacerbated by warfarin , requiring surgery 2 residual right eye  vision deficits Atrial flutter EF 30 to 35% Who presents for follow up of his MR repair and atrial flutter  Previously disinclined because of his eyes to undergo anticoagulation and cardioversion  He was started on amiodarone as a class IIb indication for rate control   amio has been discontinued because of nightmares.    May 6283 acute systolic heart failure with symptoms of impending doom.   He was in atrial flutter with uncontrolled rate.    underwent CRT-P and AV junction ablation.    Failed Entresto in the past secondary to orthostasis Chronic shortness of breath, worse with exertion Leg weakness Forgetfulness Financial issues  Blood pressure "up and down" Does not want a BP medication  Sore inside the nose, been there 2 years One side, raw inside on left, hurts all the time  Lasix 3 to 4 times a week Sometimes "flushes me out, sometimes it does not"   Seen by Dr. Caryl Comes December 29, 2017 Patient reported having increasing shortness of breath and fatigue, edema Only wants to take Lasix no other medications Stopped amiodarone secondary to abdominal discomfort and bad dreams  It was recommended he consider AV junction ablation and pacing  Echocardiogram August 2018 with ejection fraction 40-45% 4 cm aortic root Was in atrial flutter during the study rate 100 up to 120 bpm  Stopped amiodarone on his  own, felt it was causing side effects   Prior CV studies:   The following studies were reviewed today:  Echo 11/2018 - Left ventricle: The cavity size was normal. There was moderate   concentric hypertrophy. Systolic function was moderately to   severely reduced. The estimated ejection fraction was in the   range of 30% to 35%. Diffuse hypokinesis.  Severe hypokinesis of   the inferior myocardium. The study is not technically sufficient   to allow evaluation of LV diastolic function. - Aortic root: The aortic root was mild to moderately dilated 4.3   cm - Ascending aorta: The ascending aorta was mildly dilated, 4.1 cm - Mitral valve: Prior procedures included surgical repair. The   findings are consistent with mild stenosis. Mean gradient (D): 5   mm Hg. - Left atrium: The atrium was mildly dilated. - Right ventricle: Pacer wire or catheter noted in right ventricle.   Systolic function was mildly reduced. - Pulmonary arteries: Systolic pressure could not be accurately   estimated.   Past Medical History:  Diagnosis Date  . Arthritis   . Asthma   . Bradycardia   . CAD in native artery    a. LHC 09/2015: 40% pCx, 35% mRCA.  [name] COPD (chronic obstructive pulmonary disease) ([employer])   . Dilation of intestine 01/2015  . Fibromyalgia   . Frequent headaches   . GERD (gastroesophageal reflux disease)   . History of blood clots    eye   . History of hiatal hernia   . History of rheumatic fever   . Hypertension   . Kidney stone   . PAF (paroxysmal atrial fibrillation) ([place])    a. s/p TEE/DCCV 07/2015. b. H/o bleeding on Coumadin when INR >5, changed to Eliquis.  . S/P Minimally invasive maze operation for atrial fibrillation 10/22/2015   Complete bilateral atrial lesion set using cryothermy and bipolar radiofrequency ablation with clipping of LA appendage via right mini thoracotomy approach  . S/P minimally invasive mitral valve repair 10/22/2015   Complex valvuloplasty including triangular resection of posterior leaflet, artificial Gore-tex neochord placement x6 and 38 mm Sorin Memo 3D Rechord ring annuloplasty via right minithoracotomy approach  . Severe mitral regurgitation    a. s/p MV repair 10/2015.  [name] Sleep apnea   . Stroke ([place])   . Thyroid disorder   . TIA (transient ischemic attack)   . Tobacco abuse    Past Surgical  History:  Procedure Laterality Date  . ANKLE SURGERY    . AV NODE ABLATION N/A 05/04/2018   Procedure: AV NODE ABLATION;  Surgeon: Thompson Grayer, MD;  Location: [place] [facility] CV [facility];  Service: Cardiovascular;  Laterality: N/A;  . BIV PACEMAKER INSERTION CRT-P N/A 05/03/2018   Procedure: BIV PACEMAKER INSERTION CRT-P;  Surgeon: Deboraha Sprang, MD;  Location: [place] CV [facility];  Service: Cardiovascular;  Laterality: N/A;  . CARDIAC CATHETERIZATION N/A 10/03/2015   Procedure: Right and Left Heart Cath and Coronary Angiography;  Surgeon: [name], MD;  Location: [facility];  Service: Cardiovascular;  Laterality: N/A;  . COLONOSCOPY    . ELECTROPHYSIOLOGIC STUDY N/A 08/18/2015   Procedure: CARDIOVERSION;  Surgeon: Wellington Hampshire, MD;  Location: [facility] OR[facility];  Service: Cardiovascular;  Laterality: N/A;  . MINIMALLY INVASIVE MAZE PROCEDURE N/A 10/22/2015   Procedure: MINIMALLY INVASIVE MAZE PROCEDURE;  Surgeon: Rexene Alberts, MD;  Location: [facility];  Service: Open Heart Surgery;  Laterality: N/A;  . MITRAL VALVE REPAIR Right 10/22/2015  Procedure: MINIMALLY INVASIVE MITRAL VALVE REPAIR (MVR);  Surgeon: Rexene Alberts, MD;  Location: [facility];  Service: Open Heart Surgery;  Laterality: Right;  . SINUS EXPLORATION    . TEE WITHOUT CARDIOVERSION N/A 08/18/2015   Procedure: TRANSESOPHAGEAL ECHOCARDIOGRAM (TEE);  Surgeon: Wellington Hampshire, MD;  Location: [facility] OR[facility];  Service: Cardiovascular;  Laterality: N/A;  . TEE WITHOUT CARDIOVERSION N/A 10/22/2015   Procedure: TRANSESOPHAGEAL ECHOCARDIOGRAM (TEE);  Surgeon: Rexene Alberts, MD;  Location: [facility];  Service: Open Heart Surgery;  Laterality: N/A;     Current Meds  Medication Sig  . ALPRAZolam (XANAX) 0.25 MG tablet Take 1 tablet (0.25 mg total) by mouth 2 (two) times daily as needed for anxiety.  . furosemide (LASIX) 40 MG tablet TAKE 1 TABLET BY MOUTH TWICE A DAY AS NEEDED     Allergies:   Codeine; Digoxin and related; Macrodantin  [nitrofurantoin macrocrystal]; and Morphine and related   Social History   Tobacco Use  . Smoking status: Current Every Day Smoker    Packs/day: 0.50    Years: 52.00    Pack years: 26.00    Types: Cigarettes    Last attempt to quit: 02/13/2018    Years since quitting: 1.2  . Smokeless tobacco: Never Used  . Tobacco comment: Resumed smoking, after quit 01/2018  Substance Use Topics  . Alcohol use: No    Alcohol/week: 1.0 standard drinks    Types: 1 Standard drinks or equivalent per week    Comment: occasionally drinks a margarita  . Drug use: No     Current Outpatient Medications on File Prior to Visit  Medication Sig Dispense Refill  . ALPRAZolam (XANAX) 0.25 MG tablet Take 1 tablet (0.25 mg total) by mouth 2 (two) times daily as needed for anxiety. 60 tablet 2  . furosemide (LASIX) 40 MG tablet TAKE 1 TABLET BY MOUTH TWICE A DAY AS NEEDED 60 tablet 3   No current facility-administered medications on file prior to visit.      Family Hx: The patient's family history includes Heart murmur in his brother; Hypertension in an other family member; Irregular heart beat in his mother; Pulmonary embolism in his brother and maternal uncle; Stroke in his mother.  ROS:   Please see the history of present illness.    Review of Systems  Constitutional: Positive for malaise/fatigue.  HENT:       Pain in his nose left side  Respiratory: Negative.   Cardiovascular: Negative.   Gastrointestinal: Negative.   Musculoskeletal: Negative.   Neurological: Negative.   Psychiatric/Behavioral: Negative.   All other systems reviewed and are negative.     Labs/Other Tests and Data Reviewed:    Recent Labs: 05/02/2018: B Natriuretic Peptide 400.6 05/04/2018: Hemoglobin 14.8; Platelets 158 11/07/2018: ALT 10; BUN 9; Creatinine, Ser 0.97; Magnesium 2.1; Potassium 4.4; Sodium 140 02/14/2019: TSH 3.720   Recent Lipid Panel Lab Results  Component Value Date/Time   CHOL 215 (H) 05/01/2018 10:23 AM    TRIG 79 05/01/2018 10:23 AM   HDL 42 05/01/2018 10:23 AM   CHOLHDL 5.1 05/01/2018 10:23 AM   LDLCALC 157 (H) 05/01/2018 10:23 AM    Wt Readings from Last 3 Encounters:  04/30/19 198 lb (89.8 kg)  02/27/19 206 lb (93.4 kg)  02/14/19 203 lb 8 oz (92.3 kg)     Exam:    Vital Signs: Vital signs may also be detailed in the HPI BP (!) 145/86   Pulse 81   Ht 5\' 11"  (1.803  m)   Wt 198 lb (89.8 kg)   SpO2 98%   BMI 27.62 kg/m   Wt Readings from Last 3 Encounters:  04/30/19 198 lb (89.8 kg)  02/27/19 206 lb (93.4 kg)  02/14/19 203 lb 8 oz (92.3 kg)   Temp Readings from Last 3 Encounters:  02/27/19 97.8 F (36.6 C) (Oral)  01/28/19 97.6 F (36.4 C) (Oral)  05/05/18 (!) 97.5 F (36.4 C) (Oral)   BP Readings from Last 3 Encounters:  04/30/19 (!) 145/86  02/27/19 123/73  01/28/19 118/87   Pulse Readings from Last 3 Encounters:  04/30/19 81  02/27/19 78  01/28/19 80     Well nourished, well developed male in no acute distress. Constitutional:  oriented to person, place, and time. No distress.  Head: Normocephalic and atraumatic.  Eyes:  no discharge. No scleral icterus.  Neck: Normal range of motion. Neck supple.  Pulmonary/Chest: No audible wheezing, no distress, appears comfortable Musculoskeletal: Normal range of motion.  no  tenderness or deformity.  Neurological:   Coordination normal. Full exam not performed Skin:  No rash Psychiatric:  normal mood and affect. behavior is normal. Thought content normal.    ASSESSMENT & PLAN:    Chronic atrial fibrillation Does not want anticoagultion Paced  Complete heart block (HCC) paced rhythm  NICM (nonischemic cardiomyopathy) (HCC) EF 30 to 35% in 11/2018 Does not want meds  Atrial flutter, unspecified type ([place]) Previous TIA/strokes Does not want anticoagulation  Weakness of both lower extremities decontitioned  Dilated aorta The aortic root was mild to moderately dilated 4.3 cm on echo   COVID-19  Education: The signs and symptoms of COVID-19 were discussed with the patient and how to seek care for testing (follow up with PCP or arrange E-visit).  The importance of social distancing was discussed today.  Patient Risk:   After full review of this patients clinical status, I feel that they are at least moderate risk at this time.  Time:   Today, I have spent 25 minutes with the patient with telehealth technology discussing the cardiac and medical problems/diagnoses detailed above   10 min spent reviewing the chart prior to patient visit today   Medication Adjustments/Labs and Tests Ordered: Current medicines are reviewed at length with the patient today.  Concerns regarding medicines are outlined above.   Tests Ordered: No tests ordered   Medication Changes: No changes made   Disposition: Follow-up in 6 months   Signed, Ida Rogue, MD  04/30/2019 10:30 AM    [place] Office [address] [place] [address], [place], [place] [address]

## 2019-04-30 ENCOUNTER — Other Ambulatory Visit: Payer: Self-pay

## 2019-04-30 ENCOUNTER — Telehealth (INDEPENDENT_AMBULATORY_CARE_PROVIDER_SITE_OTHER): Payer: Medicare HMO | Admitting: Cardiovascular Disease

## 2019-04-30 ENCOUNTER — Encounter: Payer: Self-pay | Admitting: Cardiovascular Disease

## 2019-04-30 VITALS — BP 145/86 | HR 81 | Ht 71.0 in | Wt 198.0 lb

## 2019-04-30 DIAGNOSIS — I4819 Other persistent atrial fibrillation: Secondary | ICD-10-CM

## 2019-04-30 DIAGNOSIS — R29898 Other symptoms and signs involving the musculoskeletal system: Secondary | ICD-10-CM | POA: Diagnosis not present

## 2019-04-30 DIAGNOSIS — I428 Other cardiomyopathies: Secondary | ICD-10-CM | POA: Diagnosis not present

## 2019-04-30 DIAGNOSIS — I482 Chronic atrial fibrillation, unspecified: Secondary | ICD-10-CM | POA: Diagnosis not present

## 2019-04-30 DIAGNOSIS — J3489 Other specified disorders of nose and nasal sinuses: Secondary | ICD-10-CM

## 2019-04-30 DIAGNOSIS — I4892 Unspecified atrial flutter: Secondary | ICD-10-CM | POA: Diagnosis not present

## 2019-04-30 DIAGNOSIS — I442 Atrioventricular block, complete: Secondary | ICD-10-CM

## 2019-04-30 NOTE — Patient Instructions (Addendum)
Referral placed for ENT for nose infection, pain on left side, inside of nare [facility]  [address] [place], [place] [address] Phone [phone] I will send this over to them for review and assistance with making appointment.   Monthly pacer checks not in the system? Last 01/2019 Message sent to device clinic to check on the status of his downloads.  Medication Instructions:  No changes  If you need a refill on your cardiac medications before your next appointment, please call your pharmacy.    Lab work: No new labs needed   If you have labs (blood work) drawn today and your tests are completely normal, you will receive your results only by: [name] MyChart Message (if you have MyChart) OR . A paper copy in the mail If you have any lab test that is abnormal or we need to change your treatment, we will call you to review the results.   Testing/Procedures: No new testing needed   Follow-Up: At [facility], you and your health needs are our priority.  As part of our continuing mission to provide you with exceptional heart care, we have created designated Provider Care Teams.  These Care Teams include your primary Cardiologist (physician) and Advanced Practice Providers (APPs -  Physician Assistants and Nurse Practitioners) who all work together to provide you with the care you need, when you need it.  . You will need a follow up appointment in 12 months .   Please call our office 2 months in advance to schedule this appointment.    . Providers on your designated Care Team:   . Murray Hodgkins, NP . Christell Faith, PA-C . Marrianne Mood, PA-C  Any Other Special Instructions Will Be Listed Below (If Applicable).  For educational health videos Log in to : [email or web address] Or : [email or web address], password : [email or web address]

## 2019-05-01 ENCOUNTER — Other Ambulatory Visit: Payer: Self-pay

## 2019-05-01 ENCOUNTER — Ambulatory Visit (INDEPENDENT_AMBULATORY_CARE_PROVIDER_SITE_OTHER): Payer: Medicare HMO | Admitting: *Deleted

## 2019-05-01 DIAGNOSIS — I442 Atrioventricular block, complete: Secondary | ICD-10-CM | POA: Diagnosis not present

## 2019-05-01 DIAGNOSIS — I4819 Other persistent atrial fibrillation: Secondary | ICD-10-CM

## 2019-05-02 LAB — CUP PACEART REMOTE DEVICE CHECK
Date Time Interrogation Session: 20200513091747
Implantable Lead Implant Date: 20190515
Implantable Lead Implant Date: 20190515
Implantable Lead Location: 753858
Implantable Lead Location: 753860
Implantable Lead Model: 5076
Implantable Pulse Generator Implant Date: 20190515
Pulse Gen Serial Number: 9427031

## 2019-05-03 DIAGNOSIS — Z8669 Personal history of other diseases of the nervous system and sense organs: Secondary | ICD-10-CM | POA: Diagnosis not present

## 2019-05-03 DIAGNOSIS — H5461 Unqualified visual loss, right eye, normal vision left eye: Secondary | ICD-10-CM | POA: Diagnosis not present

## 2019-05-03 DIAGNOSIS — F331 Major depressive disorder, recurrent, moderate: Secondary | ICD-10-CM | POA: Diagnosis not present

## 2019-05-03 DIAGNOSIS — F17200 Nicotine dependence, unspecified, uncomplicated: Secondary | ICD-10-CM | POA: Diagnosis not present

## 2019-05-03 DIAGNOSIS — Z950 Presence of cardiac pacemaker: Secondary | ICD-10-CM | POA: Diagnosis not present

## 2019-05-03 DIAGNOSIS — J449 Chronic obstructive pulmonary disease, unspecified: Secondary | ICD-10-CM | POA: Diagnosis not present

## 2019-05-09 ENCOUNTER — Telehealth: Payer: Self-pay | Admitting: Student

## 2019-05-09 NOTE — Telephone Encounter (Signed)
Attempted to make contact to enroll in [facility] clinic. LMOM.

## 2019-05-16 NOTE — Telephone Encounter (Signed)
Re-attempted. Automatically went to voicemail.

## 2019-05-16 NOTE — Progress Notes (Signed)
Remote pacemaker transmission.   

## 2019-05-18 NOTE — Telephone Encounter (Signed)
Unable to reach for ICM enrollment.  Device clinic will continue to follow every 91 days per protocol.

## 2019-06-08 DIAGNOSIS — Z950 Presence of cardiac pacemaker: Secondary | ICD-10-CM | POA: Diagnosis not present

## 2019-06-08 DIAGNOSIS — Z8669 Personal history of other diseases of the nervous system and sense organs: Secondary | ICD-10-CM | POA: Diagnosis not present

## 2019-06-08 DIAGNOSIS — I25118 Atherosclerotic heart disease of native coronary artery with other forms of angina pectoris: Secondary | ICD-10-CM | POA: Diagnosis not present

## 2019-06-08 DIAGNOSIS — H5461 Unqualified visual loss, right eye, normal vision left eye: Secondary | ICD-10-CM | POA: Diagnosis not present

## 2019-06-08 DIAGNOSIS — I495 Sick sinus syndrome: Secondary | ICD-10-CM | POA: Diagnosis not present

## 2019-06-11 ENCOUNTER — Encounter: Payer: Self-pay | Admitting: Family Medicine

## 2019-06-11 ENCOUNTER — Other Ambulatory Visit: Payer: Self-pay

## 2019-06-11 ENCOUNTER — Ambulatory Visit (INDEPENDENT_AMBULATORY_CARE_PROVIDER_SITE_OTHER): Payer: Medicare HMO | Admitting: Family Medicine

## 2019-06-11 VITALS — BP 118/91 | HR 80 | Temp 98.5°F | Resp 16 | Ht 70.0 in | Wt 207.0 lb

## 2019-06-11 DIAGNOSIS — J3489 Other specified disorders of nose and nasal sinuses: Secondary | ICD-10-CM | POA: Diagnosis not present

## 2019-06-11 DIAGNOSIS — N3001 Acute cystitis with hematuria: Secondary | ICD-10-CM

## 2019-06-11 DIAGNOSIS — G4701 Insomnia due to medical condition: Secondary | ICD-10-CM | POA: Diagnosis not present

## 2019-06-11 DIAGNOSIS — R296 Repeated falls: Secondary | ICD-10-CM

## 2019-06-11 DIAGNOSIS — R531 Weakness: Secondary | ICD-10-CM | POA: Diagnosis not present

## 2019-06-11 DIAGNOSIS — N3000 Acute cystitis without hematuria: Secondary | ICD-10-CM | POA: Diagnosis not present

## 2019-06-11 LAB — POCT URINALYSIS DIPSTICK
Bilirubin, UA: NEGATIVE
Glucose, UA: NEGATIVE
Ketones, UA: NEGATIVE
Nitrite, UA: NEGATIVE
Protein, UA: POSITIVE — AB
Spec Grav, UA: 1.01 (ref 1.010–1.025)
Urobilinogen, UA: 0.2 E.U./dL
pH, UA: 5 (ref 5.0–8.0)

## 2019-06-11 MED ORDER — MUPIROCIN 2 % EX OINT: 1.0000 "application " | TOPICAL_OINTMENT | Freq: Two times a day (BID) | CUTANEOUS | 0 refills | Status: DC

## 2019-06-11 MED ORDER — CEPHALEXIN 500 MG PO CAPS: 500.0000 mg | ORAL_CAPSULE | Freq: Three times a day (TID) | ORAL | 0 refills | Status: DC

## 2019-06-11 MED ORDER — ALPRAZOLAM 0.25 MG PO TABS: 0.2500 mg | ORAL_TABLET | Freq: Two times a day (BID) | ORAL | 2 refills | Status: DC | PRN

## 2019-06-11 NOTE — Progress Notes (Signed)
Subjective:    Patient ID: Michael Delacruz, male    DOB: 1941/04/19, 78 y.o.   MRN: [id number]  Michael Delacruz is a 78 y.o. male presenting on 06/11/2019 for Fall (lost balance and fall onset couple of months but it's hurting now pain radiate from head to shoulder to back mostly on Left side onset 5 weeks ago 167/117 mm/hg and can't remember  and has headache)   HPI  RECURRENT FALLS / GENERALIZED WEAKNESS DECONDITIONING / Dizziness Reports persistent issue now for months, seems to be onset 1 year approx since pacemaker placed 04/2018, he has had issues with feeling weaker, especially in his legs, he denies completing any cardiac rehab or specific PT regimen. He has had some difficulty with dizzy and unbalance and falls in past as well. Recently some worsening with same symptoms, difficulty remembering how long ago, last significant fall 5-6 weeks ago approx, did not hit head or other injury - describes difficulty with leg weakness since pacemaker, he has difficulty most days he says weakness in legs, and some episodes of dizziness. - He describes common occurrence with a near miss fall often going up some concrete steps into house, often has difficulty even getting downstairs as well, uses handrails. - Episodes in past with elevated BP, red flushing and felt dizzy, occurrence in past, episodic only - he says only vice is smoking. He says he gave up alcohol and other problems in past, but he still has decided against smoking. - He does not leave house much now. Stays active with hobby with his comic books - He has help from wife at home, limited other support Admits general weakness Denies headache, loss of vision, numbness tingling  Cardiology updates 04/30/19 - Dr [name], Chronic Atrial Fibrillation w/ Complete Heart Block - paced rhythm, NICM, EF 30-35% 11/2018, declined anticoagulation, history of previous TIA/Stroke. Deconditioned weakness in lower ext.  Additional complaints today:   Insomnia / Anxiety Due for Xanax refill, No new complaints. Doing well on medication.  Left nare, chronic non healing ulceration vs wound Reports chronic problem for 2-3 years now by his report with scabbing and bleeding and infection within his Left nostril, has tried several treatments in past, most recently referred to [facility] by his Cardiologist but he never heard back on that apt it was ordered 04/2019. - Not tried topical antibiotic - Denies any fevers chills sweats, drainage of pus from nose  UTI Reports similar to prior UTI, he has had some urinary frequency, hesitancy and difficulty voiding at times. Usually antibiotics have worked well for this in the past he has an allergy to [employer]. Denies any fever chills sweats flank pain or nausea vomiting, urine cloudy or darker or blood or odor, dysuria   Depression screen [facility] 2/9 06/11/2019 02/27/2019 01/28/2019  Decreased Interest 0 3 2  Down, Depressed, Hopeless 1 2 2   PHQ - 2 Score 1 5 4   Altered sleeping 1 2 2   Tired, decreased energy 2 3 3   Change in appetite 0 3 2  Feeling bad or failure about yourself  0 2 0  Trouble concentrating 1 2 2   Moving slowly or fidgety/restless 0 1 2  Suicidal thoughts 0 0 0  PHQ-9 Score 5 18 15   Difficult doing work/chores Not difficult at all Somewhat difficult Somewhat difficult    Social History   Tobacco Use  . Smoking status: Current Every Day Smoker    Packs/day: 0.50    Years: 52.00  Pack years: 26.00    Types: Cigarettes    Last attempt to quit: 02/13/2018    Years since quitting: 1.3  . Smokeless tobacco: Current User  . Tobacco comment: Resumed smoking, after quit 01/2018  Substance Use Topics  . Alcohol use: No    Alcohol/week: 1.0 standard drinks    Types: 1 Standard drinks or equivalent per week    Comment: occasionally drinks a margarita  . Drug use: No    Review of Systems Per HPI unless specifically indicated above     Objective:    BP (!) 118/91   Pulse 80    Temp 98.5 F (36.9 C) (Oral)   Resp 16   Ht 5\' 10"  (1.778 m)   Wt 207 lb (93.9 kg)   BMI 29.70 kg/m   Wt Readings from Last 3 Encounters:  06/11/19 207 lb (93.9 kg)  04/30/19 198 lb (89.8 kg)  02/27/19 206 lb (93.4 kg)    Physical Exam Vitals signs and nursing note reviewed.  Constitutional:      General: He is not in acute distress.    Appearance: He is well-developed. He is not diaphoretic.     Comments: Well-appearing, comfortable, cooperative  HENT:     Head: Normocephalic and atraumatic.     Comments: Left nare has area of medial aspect larger abrasion with some scab appears chronic Eyes:     General:        Right eye: No discharge.        Left eye: No discharge.     Conjunctiva/sclera: Conjunctivae normal.  Neck:     Musculoskeletal: Normal range of motion and neck supple.     Thyroid: No thyromegaly.  Cardiovascular:     Rate and Rhythm: Normal rate. Rhythm irregular.     Heart sounds: Normal heart sounds. No murmur.  Pulmonary:     Effort: Pulmonary effort is normal.     Breath sounds: No wheezing or rales.     Comments: Mild reduced breath sounds. Musculoskeletal: Normal range of motion.     Comments: Mild muscle atrophy extremities.  Bilateral lower extremity strength hip flex/ext and knee flex/ext 4 out of 5, able to stand without assistance and ambulate without assistance. He has a cautious gait.  Lymphadenopathy:     Cervical: No cervical adenopathy.  Skin:    General: Skin is warm and dry.     Findings: No erythema or rash.  Neurological:     Mental Status: He is alert and oriented to person, place, and time.  Psychiatric:        Behavior: Behavior normal.     Comments: Well groomed, good eye contact, normal speech and thoughts    Results for orders placed or performed in visit on 06/11/19  POCT urinalysis dipstick  Result Value Ref Range   Color, UA amber    Clarity, UA clear    Glucose, UA Negative Negative   Bilirubin, UA negative     Ketones, UA negative    Spec Grav, UA 1.010 1.010 - 1.025   Blood, UA trace    pH, UA 5.0 5.0 - 8.0   Protein, UA Positive (A) Negative   Urobilinogen, UA 0.2 0.2 or 1.0 E.U./dL   Nitrite, UA negative    Leukocytes, UA Moderate (2+) (A) Negative   Appearance clear    Odor        Assessment & Plan:   Problem List Items Addressed This Visit    Insomnia due to  medical condition Chronic Stable Controlled on xanax, refill for PRN use    Relevant Medications   ALPRAZolam (XANAX) 0.25 MG tablet    Other Visit Diagnoses    Recurrent falls    -  Primary   Generalized weakness     Clinically concerning with recurrent falls and overall gradual decline in health, seems related to past 1 year with Heart Block / Pacemaker, CHF reduced EF, and Atrial Fibrillation  Referral to Home Health PT - pending start for strengthening and home exercises, home safety given his reports of falls  Referral to Nurse Case Manager and Social Work for assistance with patient having decline in health with recurrent falls and weakness, referred to [facility] PT for strengthening due to deconditioning and other chronic comorbid conditions (pacemaker, CHF, COPD, weakness). He may need other resources, has financial limitations, unsure if he would need or qualify for more help at home, or future escalation of care     Acute cystitis with hematuria     Clinically with UTI similar to prior episodes Confirmed on UA dipstick Ordered Urine culture Start Keflex 500 TID for 7 days F/u if not improve    Relevant Medications   cephALEXin (KEFLEX) 500 MG capsule   Other Relevant Orders   Urine Culture   POCT urinalysis dipstick (Completed)   Nasal sore     Chronic recurrent sore, non healing ulceration within L nare Rx trial mupirocin BID for 1-2 weeks to help protect and heal Referral again to [facility] to see if can establish for further management, last referral was never scheduled.    Relevant Medications    mupirocin ointment (BACTROBAN) 2 %   Other Relevant Orders   Ambulatory referral to ENT      Orders Placed This Encounter  Procedures  . Urine Culture  . Ambulatory referral to ENT    Referral Priority:   Routine    Referral Type:   Consultation    Referral Reason:   Specialty Services Required    Requested Specialty:   Otolaryngology    Number of Visits Requested:   1  . POCT urinalysis dipstick    Orders Placed This Encounter  Procedures  . Urine Culture  . Ambulatory referral to ENT    Referral Priority:   Routine    Referral Type:   Consultation    Referral Reason:   Specialty Services Required    Requested Specialty:   Otolaryngology    Number of Visits Requested:   1  . Ambulatory referral to Chronic Care Management Services    Referral Priority:   Routine    Referral Type:   Consultation    Referral Reason:   Care Coordination    Number of Visits Requested:   1  . Ambulatory referral to Home Health    Referral Priority:   Routine    Referral Type:   Home Health Care    Referral Reason:   Specialty Services Required    Requested Specialty:   [place]    Number of Visits Requested:   1  . POCT urinalysis dipstick     Meds ordered this encounter  Medications  . mupirocin ointment (BACTROBAN) 2 %    Sig: Place 1 application into the nose 2 (two) times daily. For 7-10 days then as needed    Dispense:  22 g    Refill:  0  . cephALEXin (KEFLEX) 500 MG capsule    Sig: Take 1 capsule (500 mg  total) by mouth 3 (three) times daily. For 7 days    Dispense:  21 capsule    Refill:  0  . ALPRAZolam (XANAX) 0.25 MG tablet    Sig: Take 1 tablet (0.25 mg total) by mouth 2 (two) times daily as needed for anxiety.    Dispense:  60 tablet    Refill:  2     Follow up plan: Return in about 3 months (around 09/11/2019), or if symptoms worsen or fail to improve, for Falls / Weakness.   Nobie Putnam, [facility]  [facility] 06/11/2019, 2:47 PM

## 2019-06-11 NOTE — Patient Instructions (Addendum)
Thank you for coming to the office today.  Stay tuned for contact from [place] for home physical therapy and nursing evaluation, they will work on home safety and physical therapy exercises  Also our [place] team of Nurse Case Manager Merlene Morse) and Social Worker Jerene Pitch) will contact you to help coordinate everything.  -------  Refilled Xanax  ---- For the nose sore - can use topical antibiotic ointment  Stay tuned for ENT referral - call them if not heard back yet.  [address]  [place], [place] [address] Ph: [phone]  ---- For urine symptoms - trial on antibiotic Keflex   Please schedule a Follow-up Appointment to: Return in about 3 months (around 09/11/2019), or if symptoms worsen or fail to improve, for Falls / Weakness.  If you have any other questions or concerns, please feel free to call the office or send a message through [place]. You may also schedule an earlier appointment if necessary.  Additionally, you may be receiving a survey about your experience at our office within a few days to 1 week by e-mail or mail. We value your feedback.  Nobie Putnam, DO [place]

## 2019-06-12 LAB — URINE CULTURE
MICRO NUMBER:: 593503
Result:: NO GROWTH
SPECIMEN QUALITY:: ADEQUATE

## 2019-06-13 ENCOUNTER — Telehealth: Payer: Self-pay | Admitting: Family Medicine

## 2019-06-13 NOTE — Telephone Encounter (Signed)
Acknowledged. We will wait to hear if our Nurse Case Manager / Social work team can help him.  Not sure how else to help him at this time.  He is declining to follow through with my treatment advice and I am not sure what else I can offer.  Nobie Putnam, DO [facility] 06/13/2019, 1:33 PM

## 2019-06-13 NOTE — Telephone Encounter (Signed)
Michael Delacruz with  [facility]  Called   [phone] said that pt would  not sign pt for care ( was in the  House for over 1 hr)  So she can not see the pt.

## 2019-06-14 NOTE — Telephone Encounter (Signed)
Updates from recent attempt at [facility]  Michael Delacruz was not picked up by [facility]. Upon arrival of my PT he refused services. Stating he has someone coming out to see him.  Copied from message from [place], [facility] 06/14/2019, 1:47 PM

## 2019-07-02 ENCOUNTER — Ambulatory Visit: Payer: Medicare HMO | Admitting: *Deleted

## 2019-07-02 DIAGNOSIS — R531 Weakness: Secondary | ICD-10-CM

## 2019-07-02 DIAGNOSIS — I4891 Unspecified atrial fibrillation: Secondary | ICD-10-CM

## 2019-07-02 DIAGNOSIS — I5032 Chronic diastolic (congestive) heart failure: Secondary | ICD-10-CM

## 2019-07-02 DIAGNOSIS — R296 Repeated falls: Secondary | ICD-10-CM

## 2019-07-02 NOTE — Patient Instructions (Signed)
Thank you allowing the Chronic Care Management Team to be a part of your care! It was a pleasure speaking with you today!   CCM (Chronic Care Management) Team   Micheline Markes RN, BSN Nurse Care Coordinator  [phone]  Harlow Asa PharmD  Clinical Pharmacist  [phone]  Eula Fried LCSW Clinical Social Worker [phone]  Goals Addressed            This Visit's Progress   . We need to get [place] back out (pt-stated)       Current Barriers:  [name] Knowledge Deficits related to having HH coming back out.   Nurse Case Manager Clinical Goal(s):  [name] Over the next 30 days, patient will work with [facility] to address needs related to [facility] Referral   Interventions:  . Reviewed medications with patient and discussed patient failing several medications related to undesired side effects.  [name] Collaborated with [facility] Rep Jana Half [phone] regarding patient desiring to have HHPT and the confusion between [facility] and HH.  [name] Discussed plans with patient for ongoing care management follow up and provided patient with direct contact information for care management team . Initial outreach to this patient and was only able to speak to the spouse [name], she stated patient would be available to talk on Thursday, plan to call back to speak with him.  . Will f/u on the start of [facility] services   Patient Self Care Activities:  . Currently UNABLE TO independently complete IADL's related to weakness and fall need for [facility] PT  Initial goal documentation        The patient verbalized understanding of instructions provided today and declined a print copy of patient instruction materials.   The patient has been provided with contact information for the care management team and has been advised to call with any health related questions or concerns.

## 2019-07-02 NOTE — Chronic Care Management (AMB) (Signed)
Chronic Care Management   Initial Visit Note  07/02/2019 Name: Michael Delacruz MRN: [id number] DOB: 1941/08/08  Referred by: [name], DO Reason for referral : Chronic Care Management (HF, COPD Education ) and Care Coordination ([place])   [name] is a 78 y.o. year old male who is a primary care patient of [name], DO. The CCM team was consulted for assistance with chronic disease management and care coordination needs.   Review of patient status, including review of consultants reports, relevant laboratory and other test results, and collaboration with appropriate care team members and the patient's provider was performed as part of comprehensive patient evaluation and provision of chronic care management services.    SDOH (Social Determinants of Health) screening performed today. See Care Plan Entry related to challenges with: Financial Strain  Tobacco Use Physical Activity  Objective:   Goals Addressed            This Visit's Progress   . We need to get [place] back out (pt-stated)       Current Barriers:  [name] Knowledge Deficits related to having HH coming back out.   Nurse Case Manager Clinical Goal(s):  [name] Over the next 30 days, patient will work with [facility] to address needs related to [facility] Referral   Interventions:  . Reviewed medications with patient and discussed patient failing several medications related to undesired side effects.  [name] Collaborated with [facility] Rep Jana Half [phone] regarding patient desiring to have HHPT and the confusion between [facility] and HH.  [name] Discussed plans with patient for ongoing care management follow up and provided patient with direct contact information for care management team . Advised patient, providing education and rationale, to weigh daily and record, calling PCP for weight gain of 3lbs overnight or 5 pounds in a week. Spoke with spouse about this, patient currently not  weighing.  . Initial outreach to this patient and was only able to speak to the spouse [name], she stated patient would be available to talk on Thursday, plan to call back to speak with him.  . Will f/u on the start of [facility] services   Patient Self Care Activities:  . Currently UNABLE TO independently complete IADL's related to weakness and fall need for [facility] PT  Initial goal documentation         [name] was given information about Chronic Care Management services today including:  1. CCM service includes personalized support from designated clinical staff supervised by his physician, including individualized plan of care and coordination with other care providers 2. 24/7 contact phone numbers for assistance for urgent and routine care needs. 3. Service will only be billed when office clinical staff spend 20 minutes or more in a month to coordinate care. 4. Only one practitioner may furnish and bill the service in a calendar month. 5. The patient may stop CCM services at any time (effective at the end of the month) by phone call to the office staff. 6. The patient will be responsible for cost sharing (co-pay) of up to 20% of the service fee (after annual deductible is met).  Patient agreed to services and verbal consent obtained. per spouse [name]  Plan:   The care management team will reach out to the patient again over the next 7 days.  The patient has been provided with contact information for the care management team and has been advised to call with any health related questions or concerns.  Merlene Morse Marylouise Mallet RN, BSN Nurse Case Pharmacist, [facility]  [phone]) Business Mobile

## 2019-07-05 ENCOUNTER — Telehealth: Payer: Self-pay

## 2019-07-06 DIAGNOSIS — I5042 Chronic combined systolic (congestive) and diastolic (congestive) heart failure: Secondary | ICD-10-CM | POA: Diagnosis not present

## 2019-07-06 DIAGNOSIS — J432 Centrilobular emphysema: Secondary | ICD-10-CM | POA: Diagnosis not present

## 2019-07-06 DIAGNOSIS — I4892 Unspecified atrial flutter: Secondary | ICD-10-CM | POA: Diagnosis not present

## 2019-07-06 DIAGNOSIS — I429 Cardiomyopathy, unspecified: Secondary | ICD-10-CM | POA: Diagnosis not present

## 2019-07-06 DIAGNOSIS — N3001 Acute cystitis with hematuria: Secondary | ICD-10-CM | POA: Diagnosis not present

## 2019-07-06 DIAGNOSIS — I442 Atrioventricular block, complete: Secondary | ICD-10-CM | POA: Diagnosis not present

## 2019-07-06 DIAGNOSIS — R296 Repeated falls: Secondary | ICD-10-CM | POA: Diagnosis not present

## 2019-07-06 DIAGNOSIS — J340 Abscess, furuncle and carbuncle of nose: Secondary | ICD-10-CM | POA: Diagnosis not present

## 2019-07-06 DIAGNOSIS — I4821 Permanent atrial fibrillation: Secondary | ICD-10-CM | POA: Diagnosis not present

## 2019-07-09 ENCOUNTER — Telehealth: Payer: Self-pay

## 2019-07-10 DIAGNOSIS — Z950 Presence of cardiac pacemaker: Secondary | ICD-10-CM | POA: Diagnosis not present

## 2019-07-10 DIAGNOSIS — H5461 Unqualified visual loss, right eye, normal vision left eye: Secondary | ICD-10-CM | POA: Diagnosis not present

## 2019-07-10 DIAGNOSIS — I25118 Atherosclerotic heart disease of native coronary artery with other forms of angina pectoris: Secondary | ICD-10-CM | POA: Diagnosis not present

## 2019-07-10 DIAGNOSIS — Z8669 Personal history of other diseases of the nervous system and sense organs: Secondary | ICD-10-CM | POA: Diagnosis not present

## 2019-07-10 DIAGNOSIS — J449 Chronic obstructive pulmonary disease, unspecified: Secondary | ICD-10-CM | POA: Diagnosis not present

## 2019-07-10 DIAGNOSIS — F17200 Nicotine dependence, unspecified, uncomplicated: Secondary | ICD-10-CM | POA: Diagnosis not present

## 2019-07-16 ENCOUNTER — Ambulatory Visit: Payer: Medicare HMO | Admitting: *Deleted

## 2019-07-16 DIAGNOSIS — R296 Repeated falls: Secondary | ICD-10-CM

## 2019-07-16 DIAGNOSIS — R531 Weakness: Secondary | ICD-10-CM

## 2019-07-16 NOTE — Patient Instructions (Signed)
Thank you allowing the Chronic Care Management Team to be a part of your care! It was a pleasure speaking with you today!   CCM (Chronic Care Management) Team   Lissett Favorite RN, BSN Nurse Care Coordinator  [phone]  Elisabeth Dhalla PharmD  Clinical Pharmacist  [phone]  Brooke Joyce LCSW Clinical Social Worker [phone]    The patient verbalized understanding of instructions provided today and declined a print copy of patient instruction materials.   The patient has been provided with contact information for the care management team and has been advised to call with any health related questions or concerns.   

## 2019-07-16 NOTE — Chronic Care Management (AMB) (Signed)
Chronic Care Management   Follow Up Note   07/16/2019 Name: Michael Delacruz MRN: [id number] DOB: Mar 19, 1941  Referred by: [name], DO Reason for referral : Chronic Care Management (Discuss CCM goals/falls/weakness )   Michael Delacruz is a 78 y.o. year old male who is a primary care patient of [name], DO. The CCM team was consulted for assistance with chronic disease management and care coordination needs.    Review of patient status, including review of consultants reports, relevant laboratory and other test results, and collaboration with appropriate care team members and the patient's provider was performed as part of comprehensive patient evaluation and provision of chronic care management services.    Goals Addressed            This Visit's Progress   . I want to get my strength up (pt-stated)       Current Barriers:  [name] Knowledge deficit related to basic heart failure pathophysiology and self care management . Knowledge Deficits related to heart failure medications . Financial strain . Cognitive Deficits-Patient reported memory issues  Case Manager Clinical Goal(s):  [name] Over the next 120 days, patient will verbalize understanding of Heart Failure Action Plan and when to call doctor . Over the next 120 days the patient will verbalize an increase of strength  Interventions:  . Basic overview and discussion of pathophysiology of Heart Failure reviewed  . Provided verbal education on low sodium diet . Advised patient to weigh each morning after emptying bladder . Discussed importance of daily weight and advised patient to weigh and record daily . Reviewed role of diuretics in prevention of fluid overload and management of heart failure . Advised patient, providing education and rationale, to weigh daily and record, calling PCP for weight gain of 3lbs overnight or 5 pounds in a week. Patient reports weighing daily and taking his lasix when he  is up in his weight. He reports his weight is 200lbs today and this is his average weight. . Discussed with patient the importance of exercise in increasing endurance and strength which patient stated he wanted to do. I mentioned Cardiac Rehab and patient stated "not right now"  . Empathetic listening, patient very talkative about his health, his past life and recent losses he has had in his family. Will report to LCSW patient's expressed grief for further exploration.  Patient Self Care Activities:  . Weighs daily and record (notifying MD of 3 lb weight gain over night or 5 lb in a week)  Initial goal documentation     . COMPLETED: We need to get [place] back out (pt-stated)       Current Barriers:  [name] Knowledge Deficits related to having HH coming back out.   Nurse Case Manager Clinical Goal(s):  [name] Over the next 30 days, patient will work with [facility] to address needs related to [facility] Referral   Interventions:  . Reviewed medications with patient and discussed patient failing several medications related to undesired side effects.  . Discussed plans with patient for ongoing care management follow up and provided patient with direct contact information for care management teamSpoke with patient today, he stated [facility] did restart but he stated "the other person lied about me refusing their care so after she left I called and cancelled the whole thing." Attempted to explore this further and even set up [facility] with another company but patient was not interested.  .     Patient Self Care Activities:  .  Currently UNABLE TO independently complete IADL's related to weakness and fall need for [facility] PT  Initial goal documentation         The care management team will reach out to the patient again over the next 30 days.  The patient has been provided with contact information for the care management team and has been advised to call with any health related questions or concerns.   Merlene Morse Zenita Kister RN, BSN Nurse Case  Pharmacist, [facility]  [phone]) Business Mobile

## 2019-07-19 ENCOUNTER — Ambulatory Visit: Payer: Medicare HMO | Admitting: Licensed Clinical Social Worker

## 2019-07-19 ENCOUNTER — Other Ambulatory Visit: Payer: Self-pay

## 2019-07-19 DIAGNOSIS — F411 Generalized anxiety disorder: Secondary | ICD-10-CM

## 2019-07-19 DIAGNOSIS — F331 Major depressive disorder, recurrent, moderate: Secondary | ICD-10-CM

## 2019-07-19 NOTE — Chronic Care Management (AMB) (Signed)
  Chronic Care Management    Clinical Social Work Follow Up Note  07/19/2019 Name: Michael Delacruz MRN: [id number] DOB: 1941-01-05  Michael Delacruz is a 78 y.o. year old male who is a primary care patient of [name], DO. The CCM team was consulted for assistance with [employer] .   Review of patient status, including review of consultants reports, other relevant assessments, and collaboration with appropriate care team members and the patient's provider was performed as part of comprehensive patient evaluation and provision of chronic care management services.     Goals Addressed    . "I need to take better care of myself." (pt-stated)       Current Barriers:  . Financial constraints related to managing health care . Housing barriers . ADL IADL limitations . Social Isolation  Clinical Social Work Clinical Goal(s):  [name] Over the next 90 days, client will work with SW to address concerns related to knowledge deficit of appropriate self-care and depression management tools to implement into his daily routine to promote healthy living   Interventions: . Patient interviewed and appropriate assessments performed . Provided patient with information about what healthy self-care looks like . Discussed plans with patient for ongoing care management follow up and provided patient with direct contact information for care management team . Advised patient to consider grief therapy due to the loss he has experienced this year. Patient declined referral but was appreciative of education on resource.  . Assisted patient/caregiver with obtaining information about health plan benefits . Provided mental health counseling with regard to his daily difficulty with mobility/weakness/balance. Patient has frequent falls and does not have a medical alert system. LCSW used active and reflective listening and implemented appropriate interventions to help suppport patient and his emotional  needs. Advised patient to implement deep breathing/grounding/meditation/self-care exercises into his daily routine to combat stressors. Education provided.   Patient Self Care Activities:  . Attends all scheduled provider appointments . Calls provider office for new concerns or questions  Initial goal documentation     Follow Up Plan: SW will follow up with patient by phone again within 60 days.  Eula Fried, BSW, MSW, [email or web address] Phone: [phone]

## 2019-07-31 ENCOUNTER — Ambulatory Visit: Payer: Medicare HMO | Admitting: *Deleted

## 2019-08-02 LAB — CUP PACEART REMOTE DEVICE CHECK
Battery Remaining Longevity: 97 mo
Battery Remaining Percentage: 95.5 %
Battery Voltage: 3.01 V
Brady Statistic RV Percent Paced: 88 %
Date Time Interrogation Session: 20200813115354
Implantable Lead Implant Date: 20190515
Implantable Lead Implant Date: 20190515
Implantable Lead Location: 753858
Implantable Lead Location: 753860
Implantable Lead Model: 5076
Implantable Pulse Generator Implant Date: 20190515
Lead Channel Impedance Value: 1125 Ohm
Lead Channel Impedance Value: 550 Ohm
Lead Channel Pacing Threshold Amplitude: 1 V
Lead Channel Pacing Threshold Pulse Width: 0.5 ms
Lead Channel Sensing Intrinsic Amplitude: 7.9 mV
Lead Channel Setting Pacing Amplitude: 2.5 V
Lead Channel Setting Pacing Amplitude: 2.5 V
Lead Channel Setting Pacing Pulse Width: 0.5 ms
Lead Channel Setting Pacing Pulse Width: 0.5 ms
Lead Channel Setting Sensing Sensitivity: 2 mV
Pulse Gen Serial Number: 9427031

## 2019-08-06 ENCOUNTER — Telehealth: Payer: Self-pay

## 2019-08-09 ENCOUNTER — Ambulatory Visit (INDEPENDENT_AMBULATORY_CARE_PROVIDER_SITE_OTHER): Payer: Medicare HMO | Admitting: Licensed Clinical Social Worker

## 2019-08-09 DIAGNOSIS — F331 Major depressive disorder, recurrent, moderate: Secondary | ICD-10-CM

## 2019-08-09 DIAGNOSIS — F411 Generalized anxiety disorder: Secondary | ICD-10-CM

## 2019-08-09 NOTE — Chronic Care Management (AMB) (Signed)
  Chronic Care Management    Clinical Social Work Follow Up Note  08/09/2019 Name: Michael Delacruz MRN: [id number] DOB: 04/15/1941  Michael Delacruz is a 78 y.o. year old male who is a primary care patient of [name], DO. The CCM team was consulted for assistance with Grief Counseling.   Review of patient status, including review of consultants reports, other relevant assessments, and collaboration with appropriate care team members and the patient's provider was performed as part of comprehensive patient evaluation and provision of chronic care management services.    SDOH (Social Determinants of Health) screening performed today: Stress Physical Activity. See Care Plan for related entries.   Outpatient Encounter Medications as of 08/09/2019  Medication Sig Note  . ALPRAZolam (XANAX) 0.25 MG tablet Take 1 tablet (0.25 mg total) by mouth 2 (two) times daily as needed for anxiety.   . cephALEXin (KEFLEX) 500 MG capsule Take 1 capsule (500 mg total) by mouth 3 (three) times daily. For 7 days   . furosemide (LASIX) 40 MG tablet TAKE 1 TABLET BY MOUTH TWICE A DAY AS NEEDED 02/27/2019: Taking 3 times a week   . mupirocin ointment (BACTROBAN) 2 % Place 1 application into the nose 2 (two) times daily. For 7-10 days then as needed    No facility-administered encounter medications on file as of 08/09/2019.      Goals Addressed            This Visit's Progress   . SW-"I need to take better care of myself." (pt-stated)       Current Barriers:  . Financial constraints related to managing health care . Housing barriers . ADL IADL limitations . Social Isolation  Clinical Social Work Clinical Goal(s):  [name] Over the next 90 days, client will work with SW to address concerns related to knowledge deficit of appropriate self-care and depression management tools to implement into his daily routine to promote healthy living   Interventions: . Patient interviewed and appropriate  assessments performed . Provided patient with information about what healthy self-care looks like . Discussed plans with patient for ongoing care management follow up and provided patient with direct contact information for care management team . Advised patient to consider grief therapy due to the loss he has experienced this year. Patient declined referral but was appreciative of education on resource. UPDATE- Patient declined grief counseling referral again on 08/09/2019 . Assisted patient/caregiver with obtaining information about health plan benefits . Provided education on available community resources within the local area that patient may benefit from. . Patient confirms ongoing SOB in the mornings when overexerts himself. . Provided mental health counseling with regard to his daily difficulty with mobility/weakness/balance. Patient has frequent falls and does not have a medical alert system. LCSW used active and reflective listening and implemented appropriate interventions to help suppport patient and his emotional needs. Advised patient to implement deep breathing/grounding/meditation/self-care exercises into his daily routine to combat stressors. Education provided.   Patient Self Care Activities:  . Attends all scheduled provider appointments . Calls provider office for new concerns or questions  Please see past updates related to this goal by clicking on the "Past Updates" button in the selected goal       Follow Up Plan: SW will follow up with patient by phone over the next 60 days  Eula Fried, [employer], MSW, [email or web address] Phone: [phone]

## 2019-08-10 DIAGNOSIS — Z8669 Personal history of other diseases of the nervous system and sense organs: Secondary | ICD-10-CM | POA: Diagnosis not present

## 2019-08-10 DIAGNOSIS — E261 Secondary hyperaldosteronism: Secondary | ICD-10-CM | POA: Diagnosis not present

## 2019-08-10 DIAGNOSIS — I509 Heart failure, unspecified: Secondary | ICD-10-CM | POA: Diagnosis not present

## 2019-08-10 DIAGNOSIS — Z950 Presence of cardiac pacemaker: Secondary | ICD-10-CM | POA: Diagnosis not present

## 2019-08-10 DIAGNOSIS — Z8673 Personal history of transient ischemic attack (TIA), and cerebral infarction without residual deficits: Secondary | ICD-10-CM | POA: Diagnosis not present

## 2019-08-12 ENCOUNTER — Other Ambulatory Visit: Payer: Self-pay | Admitting: Cardiovascular Disease

## 2019-08-13 DIAGNOSIS — F419 Anxiety disorder, unspecified: Secondary | ICD-10-CM | POA: Diagnosis not present

## 2019-08-13 DIAGNOSIS — F17200 Nicotine dependence, unspecified, uncomplicated: Secondary | ICD-10-CM | POA: Diagnosis not present

## 2019-08-13 DIAGNOSIS — F331 Major depressive disorder, recurrent, moderate: Secondary | ICD-10-CM | POA: Diagnosis not present

## 2019-09-03 ENCOUNTER — Telehealth: Payer: Self-pay

## 2019-09-13 ENCOUNTER — Telehealth: Payer: Self-pay

## 2019-09-14 DIAGNOSIS — Z8673 Personal history of transient ischemic attack (TIA), and cerebral infarction without residual deficits: Secondary | ICD-10-CM | POA: Diagnosis not present

## 2019-09-14 DIAGNOSIS — J449 Chronic obstructive pulmonary disease, unspecified: Secondary | ICD-10-CM | POA: Diagnosis not present

## 2019-09-14 DIAGNOSIS — E261 Secondary hyperaldosteronism: Secondary | ICD-10-CM | POA: Diagnosis not present

## 2019-09-14 DIAGNOSIS — Z8669 Personal history of other diseases of the nervous system and sense organs: Secondary | ICD-10-CM | POA: Diagnosis not present

## 2019-09-14 DIAGNOSIS — F17200 Nicotine dependence, unspecified, uncomplicated: Secondary | ICD-10-CM | POA: Diagnosis not present

## 2019-09-14 DIAGNOSIS — I509 Heart failure, unspecified: Secondary | ICD-10-CM | POA: Diagnosis not present

## 2019-09-19 ENCOUNTER — Other Ambulatory Visit: Payer: Self-pay | Admitting: Family Medicine

## 2019-09-19 DIAGNOSIS — G4701 Insomnia due to medical condition: Secondary | ICD-10-CM

## 2019-09-20 ENCOUNTER — Telehealth: Payer: Self-pay

## 2019-09-20 ENCOUNTER — Other Ambulatory Visit: Payer: Self-pay | Admitting: Nurse Practitioner

## 2019-09-20 DIAGNOSIS — G4701 Insomnia due to medical condition: Secondary | ICD-10-CM

## 2019-09-20 MED ORDER — ALPRAZOLAM 0.25 MG PO TABS: 0.2500 mg | ORAL_TABLET | Freq: Two times a day (BID) | ORAL | 0 refills | Status: DC | PRN

## 2019-09-20 NOTE — Progress Notes (Signed)
Patient came to clinic requesting refill Xanax.  Last appt with Dr. Raliegh Ip continue alprazolam for prn use.  Patient is filling #60 tabs per month since that time.  Will provide bridge to next appt with Dr. Raliegh Ip for reassessment.

## 2019-09-24 ENCOUNTER — Other Ambulatory Visit: Payer: Self-pay

## 2019-09-24 ENCOUNTER — Ambulatory Visit (INDEPENDENT_AMBULATORY_CARE_PROVIDER_SITE_OTHER): Payer: Medicare HMO | Admitting: Family Medicine

## 2019-09-24 ENCOUNTER — Encounter: Payer: Self-pay | Admitting: Family Medicine

## 2019-09-24 VITALS — BP 125/73 | HR 79 | Temp 98.2°F | Resp 16 | Ht 70.0 in | Wt 202.4 lb

## 2019-09-24 DIAGNOSIS — F331 Major depressive disorder, recurrent, moderate: Secondary | ICD-10-CM | POA: Diagnosis not present

## 2019-09-24 DIAGNOSIS — F411 Generalized anxiety disorder: Secondary | ICD-10-CM

## 2019-09-24 DIAGNOSIS — I4821 Permanent atrial fibrillation: Secondary | ICD-10-CM | POA: Diagnosis not present

## 2019-09-24 DIAGNOSIS — G4701 Insomnia due to medical condition: Secondary | ICD-10-CM

## 2019-09-24 DIAGNOSIS — J432 Centrilobular emphysema: Secondary | ICD-10-CM | POA: Diagnosis not present

## 2019-09-24 MED ORDER — ALPRAZOLAM 0.25 MG PO TABS: 0.2500 mg | ORAL_TABLET | Freq: Two times a day (BID) | ORAL | 0 refills | Status: AC | PRN

## 2019-09-24 NOTE — Progress Notes (Addendum)
Subjective:    Patient ID: Michael Delacruz, male    DOB: 1941-07-07, 78 y.o.   MRN: [id number]  Michael Delacruz is a 78 y.o. male presenting on 09/24/2019 for Anxiety   HPI   Insomnia / Anxiety / Depression Chronic problem. Does well on Xanax 0.25mg  twice daily to control his anxiety. No new complaints. He has not had withdrawal symptoms or other complications. Denies significant change in mood or depression, see PHQ score below still admits symptoms but not endorsing depression, declines further treatment or referral. - He admits travel to out of state for period of time likely 1-3 months, to be determined, he is asking about a 90 day refill to avoid having to get new rx out of state.  Centrilobular Emphysema Chronic problem with COPD Emphysema, he is no longer seen by Pulmonology. He is no longer on maintenance inhaler. He says wearing mask interferes with his breathing, causes dyspnea at times.  Cardiology updates 04/30/19 - Dr Mariah Milling, Chronic Atrial Fibrillation w/ Complete Heart Block - paced rhythm, NICM, EF 30-35% 11/2018, declined anticoagulation, history of previous TIA/Stroke. Deconditioned weakness in lower ext. - He says he has not had pacer check or cardiology apt since that time in May 2020. He is interested to return to them. He says he feels occasional "fluttering in chest" at times, sometimes seems to even be lower can feel it in abdomen.  Health Maintenance: Due for Flu Shot, declines today despite counseling on benefits   Depression screen [facility] 2/9 09/24/2019 06/11/2019 02/27/2019  Decreased Interest 1 0 3  Down, Depressed, Hopeless 2 1 2   PHQ - 2 Score 3 1 5   Altered sleeping 1 1 2   Tired, decreased energy 2 2 3   Change in appetite 2 0 3  Feeling bad or failure about yourself  1 0 2  Trouble concentrating 1 1 2   Moving slowly or fidgety/restless 1 0 1  Suicidal thoughts 0 0 0  PHQ-9 Score 11 5 18   Difficult doing work/chores Not difficult at all Not difficult  at all Somewhat difficult   GAD 7 : Generalized Anxiety Score 09/24/2019 06/11/2019 01/28/2019  Nervous, Anxious, on Edge 1 0 3  Control/stop worrying 0 1 3  Worry too much - different things 1 1 2   Trouble relaxing 1 0 2  Restless 0 0 1  Easily annoyed or irritable 0 1 1  Afraid - awful might happen 1 0 1  Total GAD 7 Score 4 3 13   Anxiety Difficulty Not difficult at all Not difficult at all Somewhat difficult     Social History   Tobacco Use  . Smoking status: Current Every Day Smoker    Packs/day: 0.50    Years: 52.00    Pack years: 26.00    Types: Cigarettes    Last attempt to quit: 02/13/2018    Years since quitting: 1.6  . Smokeless tobacco: Current User  . Tobacco comment: Resumed smoking, after quit 01/2018  Substance Use Topics  . Alcohol use: No    Alcohol/week: 1.0 standard drinks    Types: 1 Standard drinks or equivalent per week    Comment: occasionally drinks a margarita  . Drug use: No    Review of Systems Per HPI unless specifically indicated above     Objective:    BP 125/73   Pulse 79   Temp 98.2 F (36.8 C) (Oral)   Resp 16   Ht 5\' 10"  (1.778 m)   Wt 202 lb  6.4 oz (91.8 kg)   BMI 29.04 kg/m   Wt Readings from Last 3 Encounters:  09/24/19 202 lb 6.4 oz (91.8 kg)  06/11/19 207 lb (93.9 kg)  04/30/19 198 lb (89.8 kg)    Physical Exam Vitals signs and nursing note reviewed.  Constitutional:      General: He is not in acute distress.    Appearance: He is well-developed. He is not diaphoretic.     Comments: Well-appearing, comfortable, cooperative  HENT:     Head: Normocephalic and atraumatic.     Comments: Left nare has area of medial aspect larger abrasion with some scab appears chronic Eyes:     General:        Right eye: No discharge.        Left eye: No discharge.     Conjunctiva/sclera: Conjunctivae normal.  Neck:     Musculoskeletal: Normal range of motion and neck supple.     Thyroid: No thyromegaly.  Cardiovascular:     Rate and  Rhythm: Normal rate. Rhythm irregular.     Heart sounds: Normal heart sounds. No murmur.  Pulmonary:     Effort: Pulmonary effort is normal. No respiratory distress.     Breath sounds: Normal breath sounds. No wheezing or rales.     Comments: Mild reduced breath sounds at baseline. Musculoskeletal: Normal range of motion.  Lymphadenopathy:     Cervical: No cervical adenopathy.  Skin:    General: Skin is warm and dry.     Findings: No erythema or rash.  Neurological:     Mental Status: He is alert and oriented to person, place, and time.  Psychiatric:        Behavior: Behavior normal.     Comments: Well groomed, good eye contact, normal speech and thoughts    Results for orders placed or performed in visit on 07/31/19  CUP PACEART REMOTE DEVICE CHECK  Result Value Ref Range   Date Time Interrogation Session [id number]    Pulse Generator Manufacturer [facility]    Pulse Gen Model [id number] Alyse Low    Pulse Gen Serial Number [id number]    Clinic Name [facility]    Implantable Pulse Generator Type Implantable Pulse Generator    Implantable Pulse Generator Implant Date [id number]    Implantable Lead Manufacturer [facility]    Implantable Lead Model 5076 CapSureFix Novus    Implantable Lead Serial Number [id number]    Implantable Lead Implant Date [id number]    Implantable Lead Location Detail 1 UNKNOWN    Implantable Lead Location [id number]    Implantable Lead Manufacturer [facility]    Implantable Lead Model 1458Q Quartet    Implantable Lead Serial Number [id number]    Implantable Lead Implant Date [id number]    Implantable Lead Location Detail 1 UNKNOWN    Implantable Lead Location [id number]    Lead Channel Setting Sensing Sensitivity 2.0 mV   Lead Channel Setting Sensing Adaptation Mode Fixed Pacing    Lead Channel Setting Pacing Pulse Width 0.5 ms   Lead Channel Setting Pacing Amplitude 2.5 V   Lead Channel Setting Pacing Pulse Width 0.5 ms   Lead Channel Setting Pacing Amplitude 2.5 V   Lead  Channel Setting Pacing Capture Mode Fixed Pacing    Lead Channel Status NULL    Lead Channel Impedance Value 1,125 ohm   Lead Channel Pacing Threshold Amplitude 1.0 V   Lead Channel Pacing Threshold Pulse Width 0.5 ms   Lead Channel Status NULL    Lead Channel  Impedance Value 550 ohm   Lead Channel Sensing Intrinsic Amplitude 7.9 mV   Battery Status MOS    Battery Remaining Longevity 97 mo   Battery Remaining Percentage 95.5 %   Battery Voltage 3.01 V   Brady Statistic RV Percent Paced 88 %      Assessment & Plan:   Problem List Items Addressed This Visit    Centrilobular emphysema (HCC)    Currently controlled without maintenance therapy Seems improved since treating cardiac conditions No longer on maintenance therapy No longer followed by Pulmonology No flare up Worse with wearing mask, otherwise doing well      Depression, major, recurrent, moderate (HCC)    Persistent mood symptoms History of depression On Xanax PRN for anxiety/insomnia Future offer SSRI vs Trazodone in future, has declined med before      Relevant Medications   ALPRAZolam (XANAX) 0.25 MG tablet   Generalized anxiety disorder - Primary    Stable control on Xanax BID Controlled without acute flare Related to chronic medical conditions primarily Refill Xanax today for 90 day supply given he will be traveling soon, will agree to refill at 3 months if needed can switch back to 30 day supply      Relevant Medications   ALPRAZolam (XANAX) 0.25 MG tablet   Insomnia due to medical condition    Chronic insomnia Seems multifactorial etiology, some degree of underlying depression likely affecting his symptoms. On chronic BDZ with Xanax Limited use or not tried SSRI or other anti depressant or more maintenance med  Plan Re order dose Xanax 0.25mg  BID for now for both anxiety and insomnia, cautiously avoid withdrawal Future reconsider other options SSRI vs Trazodone      Relevant Medications    ALPRAZolam (XANAX) 0.25 MG tablet   Permanent atrial fibrillation (HCC)    Followed by Cardiology Stable currently without flare, has episodic fluctuations in irregular rhythm palpitations but otherwise asymptomatic Has pacer, awaiting updated pacer check          Meds ordered this encounter  Medications  . ALPRAZolam (XANAX) 0.25 MG tablet    Sig: Take 1 tablet (0.25 mg total) by mouth 2 (two) times daily as needed for anxiety.    Dispense:  180 tablet    Refill:  0    Patient request 90 day supply because he is traveling out of state for period of time.     Follow up plan: Return in about 6 months (around 03/24/2020) for 3-6 months follow-up anxiety.   Saralyn Pilar, DO [facility] 09/24/2019, 8:15 AM

## 2019-09-24 NOTE — Assessment & Plan Note (Signed)
Followed by Cardiology Stable currently without flare, has episodic fluctuations in irregular rhythm palpitations but otherwise asymptomatic Has pacer, awaiting updated pacer check

## 2019-09-24 NOTE — Assessment & Plan Note (Addendum)
Chronic insomnia Seems multifactorial etiology, some degree of underlying depression likely affecting his symptoms. On chronic BDZ with Xanax Limited use or not tried SSRI or other anti depressant or more maintenance med  Plan Re order dose Xanax 0.25mg  BID for now for both anxiety and insomnia, cautiously avoid withdrawal Future reconsider other options SSRI vs Trazodone

## 2019-09-24 NOTE — Patient Instructions (Addendum)
Thank you for coming to the office today.  90 day rx for Alprazolam (Xanax) - I cannot order this out of state, if you decide, notify us in 3 months for further refills  Go ahead and contact your Cardiology for a pacemaker check.  Please schedule a Follow-up Appointment to: Return in about 6 months (around 03/24/2020) for 3-6 months follow-up anxiety.  If you have any other questions or concerns, please feel free to call the office or send a message through [place]. You may also schedule an earlier appointment if necessary.  Additionally, you may be receiving a survey about your experience at our office within a few days to 1 week by e-mail or mail. We value your feedback.  Nobie Putnam, DO [place]

## 2019-09-24 NOTE — Assessment & Plan Note (Signed)
Currently controlled without maintenance therapy Seems improved since treating cardiac conditions No longer on maintenance therapy No longer followed by Pulmonology No flare up Worse with wearing mask, otherwise doing well

## 2019-09-24 NOTE — Assessment & Plan Note (Signed)
Persistent mood symptoms History of depression On Xanax PRN for anxiety/insomnia Future offer SSRI vs Trazodone in future, has declined med before

## 2019-09-24 NOTE — Assessment & Plan Note (Addendum)
Stable control on Xanax BID Controlled without acute flare Related to chronic medical conditions primarily Refill Xanax today for 90 day supply given he will be traveling soon, will agree to refill at 3 months if needed can switch back to 30 day supply

## 2019-09-27 ENCOUNTER — Ambulatory Visit: Payer: Self-pay | Admitting: Licensed Clinical Social Worker

## 2019-09-27 ENCOUNTER — Telehealth: Payer: Self-pay

## 2019-09-27 NOTE — Chronic Care Management (AMB) (Signed)
  Care Management   Follow Up Note   09/27/2019 Name: Michael Delacruz MRN: [id number] DOB: August 26, 1941  Referred by: [name], DO Reason for referral : [place] is a 78 y.o. year old male who is a primary care patient of [name], DO. The care management team was consulted for assistance with care management and care coordination needs.    Review of patient status, including review of consultants reports, relevant laboratory and other test results, and collaboration with appropriate care team members and the patient's provider was performed as part of comprehensive patient evaluation and provision of chronic care management services.    LCSW completed CCM outreach attempt today but was unable to reach patient successfully. A HIPPA compliant voice message was left encouraging patient to return call once available. LCSW rescheduled CCM SW appointment as well.   A HIPPA compliant phone message was left for the patient providing contact information and requesting a return call.   Eula Fried, BSW, MSW, [email or web address] Phone: [phone]

## 2019-10-02 ENCOUNTER — Encounter: Payer: Self-pay | Admitting: *Deleted

## 2019-10-02 ENCOUNTER — Telehealth: Payer: Self-pay | Admitting: *Deleted

## 2019-10-02 NOTE — Telephone Encounter (Signed)
Left message for patient to notify them that it is time to schedule annual low dose lung cancer screening CT scan. Instructed patient to call back to verify information prior to the scan being scheduled.  

## 2019-10-04 ENCOUNTER — Telehealth: Payer: Self-pay | Admitting: *Deleted

## 2019-10-04 DIAGNOSIS — Z122 Encounter for screening for malignant neoplasm of respiratory organs: Secondary | ICD-10-CM

## 2019-10-04 DIAGNOSIS — Z87891 Personal history of nicotine dependence: Secondary | ICD-10-CM

## 2019-10-04 NOTE — Telephone Encounter (Signed)
Patient has been notified that annual lung cancer screening low dose CT scan is due currently or will be in near future. Confirmed that patient is within the age range of 55-77, and asymptomatic, (no signs or symptoms of lung cancer). Patient denies illness that would prevent curative treatment for lung cancer if found. Verified smoking history, (current, 32.5 pack year). The shared decision making visit was done 03/07/18. Patient is agreeable for CT scan being scheduled.

## 2019-10-09 ENCOUNTER — Ambulatory Visit
Admission: RE | Admit: 2019-10-09 | Discharge: 2019-10-09 | Disposition: A | Discharge status: Home / Self Care | Payer: Medicare HMO | Source: Ambulatory Visit | Attending: Nurse Practitioner | Admitting: Nurse Practitioner

## 2019-10-09 ENCOUNTER — Other Ambulatory Visit: Payer: Self-pay

## 2019-10-09 DIAGNOSIS — F17210 Nicotine dependence, cigarettes, uncomplicated: Secondary | ICD-10-CM | POA: Diagnosis not present

## 2019-10-09 DIAGNOSIS — Z87891 Personal history of nicotine dependence: Secondary | ICD-10-CM | POA: Diagnosis not present

## 2019-10-09 DIAGNOSIS — Z122 Encounter for screening for malignant neoplasm of respiratory organs: Secondary | ICD-10-CM | POA: Diagnosis not present

## 2019-10-11 ENCOUNTER — Encounter: Payer: Self-pay | Admitting: *Deleted

## 2019-10-18 DIAGNOSIS — H4311 Vitreous hemorrhage, right eye: Secondary | ICD-10-CM | POA: Diagnosis not present

## 2019-10-18 DIAGNOSIS — Z950 Presence of cardiac pacemaker: Secondary | ICD-10-CM | POA: Diagnosis not present

## 2019-10-18 DIAGNOSIS — I25118 Atherosclerotic heart disease of native coronary artery with other forms of angina pectoris: Secondary | ICD-10-CM | POA: Diagnosis not present

## 2019-10-18 DIAGNOSIS — Z8669 Personal history of other diseases of the nervous system and sense organs: Secondary | ICD-10-CM | POA: Diagnosis not present

## 2019-10-18 DIAGNOSIS — H5461 Unqualified visual loss, right eye, normal vision left eye: Secondary | ICD-10-CM | POA: Diagnosis not present

## 2019-10-22 ENCOUNTER — Telehealth: Payer: Self-pay

## 2019-10-30 ENCOUNTER — Ambulatory Visit (INDEPENDENT_AMBULATORY_CARE_PROVIDER_SITE_OTHER): Payer: Medicare HMO | Admitting: *Deleted

## 2019-10-30 DIAGNOSIS — I4821 Permanent atrial fibrillation: Secondary | ICD-10-CM

## 2019-10-30 DIAGNOSIS — I43 Cardiomyopathy in diseases classified elsewhere: Secondary | ICD-10-CM

## 2019-10-30 DIAGNOSIS — I428 Other cardiomyopathies: Secondary | ICD-10-CM

## 2019-10-30 DIAGNOSIS — R000 Tachycardia, unspecified: Secondary | ICD-10-CM

## 2019-10-30 LAB — CUP PACEART REMOTE DEVICE CHECK
Battery Remaining Longevity: 97 mo
Battery Remaining Percentage: 95.5 %
Battery Voltage: 3.01 V
Date Time Interrogation Session: 20201110141005
Implantable Lead Implant Date: 20190515
Implantable Lead Implant Date: 20190515
Implantable Lead Location: 753858
Implantable Lead Location: 753860
Implantable Lead Model: 5076
Implantable Pulse Generator Implant Date: 20190515
Lead Channel Impedance Value: 1125 Ohm
Lead Channel Impedance Value: 550 Ohm
Lead Channel Pacing Threshold Amplitude: 0.5 V
Lead Channel Pacing Threshold Amplitude: 1 V
Lead Channel Pacing Threshold Pulse Width: 0.5 ms
Lead Channel Pacing Threshold Pulse Width: 0.5 ms
Lead Channel Sensing Intrinsic Amplitude: 12 mV
Lead Channel Setting Pacing Amplitude: 2.5 V
Lead Channel Setting Pacing Amplitude: 2.5 V
Lead Channel Setting Pacing Pulse Width: 0.5 ms
Lead Channel Setting Pacing Pulse Width: 0.5 ms
Lead Channel Setting Sensing Sensitivity: 2 mV
Pulse Gen Serial Number: 9427031

## 2019-11-08 ENCOUNTER — Telehealth: Payer: Self-pay

## 2019-11-08 ENCOUNTER — Ambulatory Visit: Payer: Self-pay | Admitting: Licensed Clinical Social Worker

## 2019-11-08 NOTE — Chronic Care Management (AMB) (Signed)
  Care Management   Follow Up Note   11/08/2019 Name: Michael Delacruz MRN: [id number] DOB: 12-01-1941  Referred by: [name], DO Reason for referral : [place] is a 78 y.o. year old male who is a primary care patient of [name], DO. The care management team was consulted for assistance with care management and care coordination needs.    Review of patient status, including review of consultants reports, relevant laboratory and other test results, and collaboration with appropriate care team members and the patient's provider was performed as part of comprehensive patient evaluation and provision of chronic care management services.    LCSW completed CCM outreach attempt today but was unable to reach patient successfully. A HIPPA compliant voice message was left encouraging patient to return call once available. LCSW rescheduled CCM SW appointment as well.   A HIPPA compliant phone message was left for the patient providing contact information and requesting a return call.   Eula Fried, BSW, MSW, [email or web address] Phone: [phone]

## 2019-11-19 NOTE — Progress Notes (Signed)
Remote pacemaker transmission.   

## 2019-11-22 ENCOUNTER — Telehealth: Payer: Self-pay

## 2019-11-23 DIAGNOSIS — F17200 Nicotine dependence, unspecified, uncomplicated: Secondary | ICD-10-CM | POA: Diagnosis not present

## 2019-11-23 DIAGNOSIS — Z950 Presence of cardiac pacemaker: Secondary | ICD-10-CM | POA: Diagnosis not present

## 2019-11-23 DIAGNOSIS — J449 Chronic obstructive pulmonary disease, unspecified: Secondary | ICD-10-CM | POA: Diagnosis not present

## 2019-11-23 DIAGNOSIS — I495 Sick sinus syndrome: Secondary | ICD-10-CM | POA: Diagnosis not present

## 2019-11-23 DIAGNOSIS — I480 Paroxysmal atrial fibrillation: Secondary | ICD-10-CM | POA: Diagnosis not present

## 2019-12-10 ENCOUNTER — Telehealth: Payer: Self-pay

## 2019-12-24 ENCOUNTER — Telehealth: Payer: Self-pay

## 2019-12-24 ENCOUNTER — Ambulatory Visit: Payer: Self-pay | Admitting: *Deleted

## 2019-12-24 NOTE — Chronic Care Management (AMB) (Signed)
  Chronic Care Management   Outreach Note  12/24/2019 Name: Michael Delacruz MRN: [id number] DOB: 03/08/1941  Referred by: [name], DO Reason for referral : Chronic Care Management (Unsuccessful Outreach )   A second unsuccessful telephone outreach was attempted today. The patient was referred to the case management team for assistance with care management and care coordination.   Follow Up Plan: A HIPPA compliant phone message was left for the patient providing contact information and requesting a return call.  The care management team will reach out to the patient again over the next 60 days.   Merlene Morse Jaleigha Deane RN, BSN Nurse Case Pharmacist, [facility] Care Management  [phone]) Business Mobile

## 2019-12-25 ENCOUNTER — Telehealth: Payer: Self-pay | Admitting: Family Medicine

## 2019-12-25 NOTE — Chronic Care Management (AMB) (Signed)
  Care Management   Note  12/25/2019 Name: MOUSSA KOWALCHUK MRN: [id number] DOB: [birth year]  FILLMORE BOIK is a 79 y.o. year old male who is a primary care patient of [name], DO and is actively engaged with the care management team. I reached out to Colin Benton by phone today to assist with re-scheduling a follow up appointment with the RN Case Manager  Follow up plan: The care management team will reach out to the patient again over the next 7 days.   [place], [place] [address] Direct Dial: [email or web address]  Website: [email or web address]

## 2019-12-27 NOTE — Chronic Care Management (AMB) (Signed)
  Care Management   Note  12/27/2019 Name: Michael Delacruz MRN: [id number] DOB: 1941/07/28  Michael Delacruz is a 79 y.o. year old male who is a primary care patient of [name], DO and is actively engaged with the care management team. I reached out to Colin Benton by phone today to assist with re-scheduling a follow up appointment with the RN Case Manager  Follow up plan: Telephone appointment with care management team member scheduled for: 01/28/2020  [place], [place] [facility]  [place], [place] [address] Direct Dial: [email or web address]  Website: [email or web address]

## 2020-01-28 ENCOUNTER — Telehealth: Payer: Self-pay | Admitting: General Practice

## 2020-01-28 ENCOUNTER — Ambulatory Visit (INDEPENDENT_AMBULATORY_CARE_PROVIDER_SITE_OTHER): Payer: Medicare HMO | Admitting: General Practice

## 2020-01-28 DIAGNOSIS — I5022 Chronic systolic (congestive) heart failure: Secondary | ICD-10-CM

## 2020-01-28 DIAGNOSIS — E7800 Pure hypercholesterolemia, unspecified: Secondary | ICD-10-CM

## 2020-01-28 DIAGNOSIS — I4891 Unspecified atrial fibrillation: Secondary | ICD-10-CM | POA: Diagnosis not present

## 2020-01-28 DIAGNOSIS — F411 Generalized anxiety disorder: Secondary | ICD-10-CM

## 2020-01-28 NOTE — Patient Instructions (Signed)
Visit Information  Goals Addressed            This Visit's Progress   . RN: I have a lot of problems (pt-stated)       Current Barriers:  [name] Knowledge deficit related to basic heart failure pathophysiology and self care management . Knowledge Deficits related to heart failure medications . Financial strain . Cognitive Deficits-Patient reported memory issues  Case Manager Clinical Goal(s):  [name] Over the next 120 days, patient will weigh self daily and record . Over the next 120 days, patient will verbalize understanding of Heart Failure Action Plan and when to call doctor . Over the next 120 days, patient will weigh daily and record (notifying MD of 3 lb weight gain over night or 5 lb in a week) . Over the next 120 days the patient will verbalize an increase of strength and no reported falls  Interventions:  . Basic overview and discussion of pathophysiology of Heart Failure reviewed  . Provided verbal education on low sodium diet, the patient has limited food resources and has been eating a lot of peanut butter and jelly sandwiches  . Care guide referral to help with food resources for patient to be able to follow prescribed dietary restrictions . Advised patient to weigh each morning after emptying bladder . Discussed importance of daily weight and advised patient to weigh and record daily  . (patient is weighing daily but not recording- encouraged keeping a record . Reviewed role of diuretics in prevention of fluid overload and management of heart failure . Advised patient, providing education and rationale, to weigh daily and record, calling PCP for weight gain of 3lbs overnight or 5 pounds in a week. Patient reports weighing daily and taking his lasix when he is up in his weight. He reports his weight is 197.4 today . Discussed with patient the importance of exercise in increasing endurance and strength which patient stated he wanted to do. The patient states he is not doing a lot of  exercise . Empathetic listening, patient very talkative about his health, his past life and recent losses he has had in his family, recent death of his "baby" sister. Also behind on rent and utilities. Will report to LCSW patient's expressed grief for further exploration and assistance with financial constraints as appropriate.  Patient Self Care Activities:  . Takes Heart Failure Medications as prescribed . Weighs daily and record (notifying MD of 3 lb weight gain over night or 5 lb in a week) . Verbalizes understanding of and follows CHF Action Plan . Adheres to low sodium diet  Please see past updates related to this goal by clicking on the "Past Updates" button in the selected goal      . RN: I haven't been to the doctor in a while       Current Barriers:  . Chronic Disease Management support, education, and care coordination needs related to Atrial Fibrillation, CHF, HLD, Anxiety, and high fall risk  Clinical Goal(s) related to Atrial Fibrillation, CHF, HLD, Anxiety, and high fall risk :  Over the next 120 days, patient will:  . Work with the care management team to address educational, disease management, and care coordination needs  . Begin or continue self health monitoring activities as directed today Measure and record blood pressure 7 times per week, Measure and record weight daily, and adhere to a hearth Healthy diet . Call provider office for new or worsened signs and symptoms Blood pressure findings outside established parameters,  Weight outside established parameters, Shortness of breath, and New or worsened symptom related to alterations in blood pressure, patient states it is going up and down . Call care management team with questions or concerns . Verbalize basic understanding of patient centered plan of care established today  Interventions related to Atrial Fibrillation, CHF, HLD, Anxiety, and High fall risk :  . Evaluation of current treatment plans and patient's  adherence to plan as established by provider . Assessed patient understanding of disease states . Assessed patient's education and care coordination needs . Provided disease specific education to patient  . Collaborated with appropriate clinical care team members regarding patient needs  Patient Self Care Activities related to Atrial Fibrillation, CHF, HLD, Anxiety, and High fall risk :  . Patient is unable to independently self-manage chronic health conditions  Initial goal documentation        The patient verbalized understanding of instructions provided today and declined a print copy of patient instruction materials.   The care management team will reach out to the patient again over the next 30 days.   Noreene Larsson RN, MSN, [facility] Mobile: [phone]

## 2020-01-28 NOTE — Chronic Care Management (AMB) (Signed)
Chronic Care Management   Follow Up Note   01/28/2020 Name: Michael Delacruz MRN: [id number] DOB: 09/13/1941  Referred by: [name], DO Reason for referral : Chronic Care Management (Follow up call: Falls/HF management/Afib with pacer/HLD)   Michael Delacruz is a 79 y.o. year old male who is a primary care patient of [name], DO. The CCM team was consulted for assistance with chronic disease management and care coordination needs.    Review of patient status, including review of consultants reports, relevant laboratory and other test results, and collaboration with appropriate care team members and the patient's provider was performed as part of comprehensive patient evaluation and provision of chronic care management services.    SDOH (Social Determinants of Health) screening performed today: Patent examiner  Food Insecurity  Stress Physical Activity. See Care Plan for related entries.   Outpatient Encounter Medications as of 01/28/2020  Medication Sig Note  . ascorbic acid (VITAMIN C) 500 MG tablet Take 500 mg by mouth daily.   . calcium-vitamin D (OSCAL WITH D) 500-200 MG-UNIT tablet Take 1 tablet by mouth.   . furosemide (LASIX) 40 MG tablet TAKE 1 TABLET BY MOUTH TWICE A DAY AS NEEDED   . Homeopathic Products (HOMEOPATHIC CALM SL) Place under the tongue. Is using Doterra essential oil of Onguard ingestion daily   . ALPRAZolam (XANAX) 0.25 MG tablet Take 0.25 mg by mouth 2 (two) times daily. 01/28/2020: Out of this medication  . mupirocin ointment (BACTROBAN) 2 % Place 1 application into the nose 2 (two) times daily. For 7-10 days then as needed (Patient not taking: Reported on 01/28/2020)    No facility-administered encounter medications on file as of 01/28/2020.     Objective:  BP Readings from Last 3 Encounters:  09/24/19 125/73  06/11/19 (!) 118/91  04/30/19 (!) 145/86   Lab Results  Component Value Date   CHOL 215 (H) 05/01/2018   HDL  42 05/01/2018   LDLCALC 157 (H) 05/01/2018   TRIG 79 05/01/2018   CHOLHDL 5.1 05/01/2018    Goals Addressed            This Visit's Progress   . RN: I have a lot of problems (pt-stated)       Current Barriers:  [name] Knowledge deficit related to basic heart failure pathophysiology and self care management . Knowledge Deficits related to heart failure medications . Financial strain . Cognitive Deficits-Patient reported memory issues  Case Manager Clinical Goal(s):  [name] Over the next 120 days, patient will weigh self daily and record . Over the next 120 days, patient will verbalize understanding of Heart Failure Action Plan and when to call doctor . Over the next 120 days, patient will weigh daily and record (notifying MD of 3 lb weight gain over night or 5 lb in a week) . Over the next 120 days the patient will verbalize an increase of strength and no reported falls  Interventions:  . Basic overview and discussion of pathophysiology of Heart Failure reviewed  . Provided verbal education on low sodium diet, the patient has limited food resources and has been eating a lot of peanut butter and jelly sandwiches  . Care guide referral to help with food resources for patient to be able to follow prescribed dietary restrictions . Advised patient to weigh each morning after emptying bladder . Discussed importance of daily weight and advised patient to weigh and record daily  . (patient is weighing daily but not  recording- encouraged keeping a record . Reviewed role of diuretics in prevention of fluid overload and management of heart failure . Advised patient, providing education and rationale, to weigh daily and record, calling PCP for weight gain of 3lbs overnight or 5 pounds in a week. Patient reports weighing daily and taking his lasix when he is up in his weight. He reports his weight is 197.4 today . Discussed with patient the importance of exercise in increasing endurance and strength which  patient stated he wanted to do. The patient states he is not doing a lot of exercise . Empathetic listening, patient very talkative about his health, his past life and recent losses he has had in his family, recent death of his "baby" sister. Also behind on rent and utilities. Will report to LCSW patient's expressed grief for further exploration and assistance with financial constraints as appropriate.  Patient Self Care Activities:  . Takes Heart Failure Medications as prescribed . Weighs daily and record (notifying MD of 3 lb weight gain over night or 5 lb in a week) . Verbalizes understanding of and follows CHF Action Plan . Adheres to low sodium diet  Please see past updates related to this goal by clicking on the "Past Updates" button in the selected goal      . RN: I haven't been to the doctor in a while       Current Barriers:  . Chronic Disease Management support, education, and care coordination needs related to Atrial Fibrillation, CHF, HLD, Anxiety, and high fall risk  Clinical Goal(s) related to Atrial Fibrillation, CHF, HLD, Anxiety, and high fall risk :  Over the next 120 days, patient will:  . Work with the care management team to address educational, disease management, and care coordination needs  . Begin or continue self health monitoring activities as directed today Measure and record blood pressure 7 times per week, Measure and record weight daily, and adhere to a hearth Healthy diet . Call provider office for new or worsened signs and symptoms Blood pressure findings outside established parameters, Weight outside established parameters, Shortness of breath, and New or worsened symptom related to alterations in blood pressure, patient states it is going up and down . Call care management team with questions or concerns . Verbalize basic understanding of patient centered plan of care established today  Interventions related to Atrial Fibrillation, CHF, HLD, Anxiety, and High  fall risk :  . Evaluation of current treatment plans and patient's adherence to plan as established by provider . Assessed patient understanding of disease states . Assessed patient's education and care coordination needs . Provided disease specific education to patient  . Collaborated with appropriate clinical care team members regarding patient needs  Patient Self Care Activities related to Atrial Fibrillation, CHF, HLD, Anxiety, and High fall risk :  . Patient is unable to independently self-manage chronic health conditions  Initial goal documentation         Plan:   The care management team will reach out to the patient again over the next 30 days.    Noreene Larsson RN, MSN, [facility] Mobile: [phone]

## 2020-01-29 ENCOUNTER — Ambulatory Visit (INDEPENDENT_AMBULATORY_CARE_PROVIDER_SITE_OTHER): Payer: Medicare HMO | Admitting: *Deleted

## 2020-01-29 DIAGNOSIS — I4821 Permanent atrial fibrillation: Secondary | ICD-10-CM | POA: Diagnosis not present

## 2020-01-29 LAB — CUP PACEART REMOTE DEVICE CHECK
Battery Remaining Longevity: 96 mo
Battery Remaining Percentage: 95.5 %
Battery Voltage: 3.01 V
Date Time Interrogation Session: 20210209035426
Implantable Lead Implant Date: 20190515
Implantable Lead Implant Date: 20190515
Implantable Lead Location: 753858
Implantable Lead Location: 753860
Implantable Lead Model: 5076
Implantable Pulse Generator Implant Date: 20190515
Lead Channel Impedance Value: 1100 Ohm
Lead Channel Impedance Value: 540 Ohm
Lead Channel Pacing Threshold Amplitude: 0.5 V
Lead Channel Pacing Threshold Amplitude: 1 V
Lead Channel Pacing Threshold Pulse Width: 0.5 ms
Lead Channel Pacing Threshold Pulse Width: 0.5 ms
Lead Channel Sensing Intrinsic Amplitude: 12 mV
Lead Channel Setting Pacing Amplitude: 2.5 V
Lead Channel Setting Pacing Amplitude: 2.5 V
Lead Channel Setting Pacing Pulse Width: 0.5 ms
Lead Channel Setting Pacing Pulse Width: 0.5 ms
Lead Channel Setting Sensing Sensitivity: 2 mV
Pulse Gen Model: 3562
Pulse Gen Serial Number: 9427031

## 2020-01-29 NOTE — Progress Notes (Signed)
PPM Remote  

## 2020-01-31 ENCOUNTER — Telehealth: Payer: Self-pay

## 2020-02-06 ENCOUNTER — Telehealth: Payer: Self-pay

## 2020-02-12 ENCOUNTER — Telehealth: Payer: Self-pay

## 2020-02-12 ENCOUNTER — Ambulatory Visit: Payer: Self-pay | Admitting: Pharmacist

## 2020-02-12 NOTE — Chronic Care Management (AMB) (Signed)
  Chronic Care Management   Follow Up Note   02/12/2020 Name: Michael Delacruz MRN: [id number] DOB: 1941-08-31  Referred by: [name], DO Reason for referral : Chronic Care Management (Patient Phone Call)   MARKON TARDIO is a 79 y.o. year old male who is a primary care patient of [name], DO. The CCM team was consulted for assistance with chronic disease management and care coordination needs.    Receive a referral from [facility] for medication management.  Was unable to reach patient via telephone today and have left HIPAA compliant voicemail asking patient to return my call.    Plan  The care management team will reach out to the patient again over the next 30 days.   Harlow Asa, PharmD, [facility] [employer] [phone]

## 2020-02-15 ENCOUNTER — Ambulatory Visit: Payer: Self-pay | Admitting: Pharmacist

## 2020-02-15 DIAGNOSIS — E7800 Pure hypercholesterolemia, unspecified: Secondary | ICD-10-CM

## 2020-02-15 NOTE — Patient Instructions (Signed)
Thank you allowing the Chronic Care Management Team to be a part of your care! It was a pleasure speaking with you today!     CCM (Chronic Care Management) Team    Noreene Larsson RN, MSN, CCM Nurse Care Coordinator  [phone]   Harlow Asa PharmD  Clinical Pharmacist  [phone]   Eula Fried LCSW Clinical Social Worker [phone]  Visit Information  Goals Addressed            This Visit's Progress   . PharmD - Medication Review       CARE PLAN ENTRY (see longtitudinal plan of care for additional care plan information)   Current Barriers:  . Chronic Disease Management support, education, and care coordination needs related to Atrial Fibrillation, CHF, HLD, Anxiety  Pharmacist Clinical Goal(s):  [name] Over the next 30 days, patient will work with CM Pharmacist to review medications and address and medication needs identified  Interventions: . Schedule appointment to complete medication review . Patient asks about a refill of his alprazolam o Advise patient to contact office directly regarding this request. Patient confirms having this phone number.  Patient Self Care Activities:  . To call provider office for new concerns or questions   Initial goal documentation        Patient verbalizes understanding of instructions provided today.   Telephone follow up appointment with care management team member scheduled for: 3/10 at 1 pm  Harlow Asa, PharmD, [place] [phone]

## 2020-02-15 NOTE — Chronic Care Management (AMB) (Signed)
  Chronic Care Management   Follow Up Note   02/15/2020 Name: Michael Delacruz MRN: [id number] DOB: 11/08/1941  Referred by: [name], DO Reason for referral : Chronic Care Management (Patient Phone Call)   Michael Delacruz is a 79 y.o. year old male who is a primary care patient of [name], DO. The CCM team was consulted for assistance with chronic disease management and care coordination needs.    Receive a referral from [facility] for medication management  Receive a voicemail from Michael Delacruz returning my call and requesting a call back.  I reached out to Michael Delacruz by phone today.   Review of patient status, including review of consultants reports, relevant laboratory and other test results, and collaboration with appropriate care team members and the patient's provider was performed as part of comprehensive patient evaluation and provision of chronic care management services.     Outpatient Encounter Medications as of 02/15/2020  Medication Sig Note  . ALPRAZolam (XANAX) 0.25 MG tablet Take 0.25 mg by mouth 2 (two) times daily. 01/28/2020: Out of this medication  . ascorbic acid (VITAMIN C) 500 MG tablet Take 500 mg by mouth daily.   . calcium-vitamin D (OSCAL WITH D) 500-200 MG-UNIT tablet Take 1 tablet by mouth.   . furosemide (LASIX) 40 MG tablet TAKE 1 TABLET BY MOUTH TWICE A DAY AS NEEDED   . Homeopathic Products (HOMEOPATHIC CALM SL) Place under the tongue. Is using Doterra essential oil of Onguard ingestion daily   . mupirocin ointment (BACTROBAN) 2 % Place 1 application into the nose 2 (two) times daily. For 7-10 days then as needed (Patient not taking: Reported on 01/28/2020)    No facility-administered encounter medications on file as of 02/15/2020.    Goals Addressed            This Visit's Progress   . PharmD - Medication Review       CARE PLAN ENTRY (see longtitudinal plan of care for additional care plan  information)   Current Barriers:  . Chronic Disease Management support, education, and care coordination needs related to Atrial Fibrillation, CHF, HLD, Anxiety  Pharmacist Clinical Goal(s):  [name] Over the next 30 days, patient will work with CM Pharmacist to review medications and address and medication needs identified  Interventions: . Schedule appointment to complete medication review . Patient asks about a refill of his alprazolam o Advise patient to contact office directly regarding this request. Patient confirms having this phone number.  Patient Self Care Activities:  . To call provider office for new concerns or questions   Initial goal documentation        Plan  Telephone follow up appointment with care management team member scheduled for: 3/10 at 1 pm  Harlow Asa, PharmD, [place] [phone]

## 2020-02-20 ENCOUNTER — Telehealth: Payer: Self-pay | Admitting: Family Medicine

## 2020-02-20 ENCOUNTER — Other Ambulatory Visit: Payer: Self-pay | Admitting: Family Medicine

## 2020-02-20 DIAGNOSIS — G4701 Insomnia due to medical condition: Secondary | ICD-10-CM

## 2020-02-20 NOTE — Telephone Encounter (Signed)
Checked PDMP. Ordered Xanax refill.  Nobie Putnam, DO [facility] 02/20/2020, 4:13 PM

## 2020-02-20 NOTE — Telephone Encounter (Signed)
Pt requesting refill on Xanax. 

## 2020-02-21 ENCOUNTER — Ambulatory Visit (INDEPENDENT_AMBULATORY_CARE_PROVIDER_SITE_OTHER): Payer: Medicare HMO | Admitting: Family Medicine

## 2020-02-21 ENCOUNTER — Ambulatory Visit: Payer: Medicare HMO | Admitting: Family Medicine

## 2020-02-21 ENCOUNTER — Encounter: Payer: Self-pay | Admitting: Family Medicine

## 2020-02-21 ENCOUNTER — Other Ambulatory Visit: Payer: Self-pay

## 2020-02-21 VITALS — BP 119/70 | HR 79 | Temp 98.0°F | Resp 16 | Ht 70.0 in | Wt 200.0 lb

## 2020-02-21 DIAGNOSIS — F331 Major depressive disorder, recurrent, moderate: Secondary | ICD-10-CM

## 2020-02-21 DIAGNOSIS — F411 Generalized anxiety disorder: Secondary | ICD-10-CM

## 2020-02-21 DIAGNOSIS — J432 Centrilobular emphysema: Secondary | ICD-10-CM

## 2020-02-21 NOTE — Patient Instructions (Addendum)
Thank you for coming to the office today.  Let me know if interested in anti depressant medicine, lots of options available for you, we can try low dose and something that is well tolerated.  Refilled Medication Xanax for 3 months, next due for refill 05/2020, call or message and you don't require apt for that next refill  Will need to see you in at least 6 months for renewal rx and discuss other updates.  Please schedule a Follow-up Appointment to: Return in about 6 months (around 08/23/2020) for 6 month follow-up mood / HTN anxiety / medications.  If you have any other questions or concerns, please feel free to call the office or send a message through [facility]. You may also schedule an earlier appointment if necessary.  Additionally, you may be receiving a survey about your experience at our office within a few days to 1 week by e-mail or mail. We value your feedback.  Nobie Putnam, DO [place]

## 2020-02-21 NOTE — Assessment & Plan Note (Signed)
See A&P Depression Checked PDMP Currently controlled on daily BDZ Xanax low dose Now using 2nd dose less often Refilled Xanax 0.25mg  BID PRN, #60 pills +2 refills, should last longer than 3 months, sent rx yesterday 02/20/20

## 2020-02-21 NOTE — Progress Notes (Addendum)
Subjective:    Patient ID: Michael Delacruz, male    DOB: [birth year], 79 y.o.   MRN: [id number]  Michael Delacruz is a 79 y.o. male presenting on 02/21/2020 for Anxiety   HPI   Major Depression, chronic recurrent moderate to severe / Generalized Anxiety Disorder - Last visit with me 09/2019 for similar topic, see prior notes for background information. - Interval update with he is still taking Xanax 0.25mg  BID PRN. He was due for refill requested this apt. - Today patient reports significant decline in mood and worse anxiety. See PHQ GAD scores below - He has many life stressors including financial, family relationships, his own decline in health among many others that have affected his function overall and his mental health. He admits depression. He describes unable to visit his sister in hospital due to [place] pandemic he was not allowed to see her in person. He has significant financial strain. He has issues with relationship with family members. Describes he is not able to do what he used to do anymore with regards to playing pool and other forms of entertainment or relaxation, he has reduced exercise tolerance cannot handle walking jogging as much anymore. - He says overall these factors are "crushing him" and he wants to be more like himself but he understands he "can't" - He admits poor sleep, reduced appetite, he says his family is aware of this as well - He reviews prior history years ago with psychology arranged by his employer and that was not successful due to conflict of interest he described - He has not taken SSRI or anti depressant from chart review and he does not recall taking these, he remains not interested in medication, he does not like taking Xanax but says it is helping him control anxiety at times, he now can skip taking 2nd dose occasionally, not every day, still taking one daily and 2nd dose often.  Centrilobular Emphysema Chronic problem with COPD Emphysema, he is no  longer seen by Pulmonology. He is no longer on maintenance inhaler. Last LDCT scan 10/09/19, see results copied below. Identified ascending aorta aneurysm to be monitored yearly  Cardiology updates 04/30/19 - Dr [name], Chronic Atrial Fibrillation w/ Complete Heart Block - paced rhythm, NICM, EF 30-35% 11/2018, declined anticoagulation, history of previous TIA/Stroke. Deconditioned weakness in lower ext. - He has continued pacemaker checks. Has not had followup since, next visit with Cardiology in 04/2020 - He is worried with caution with fluid retention with CHF, says if he eats sodium content, can retain fluid 3-5 lbs, takes furosemide with relief. Currently does not have increased fluid but says from time to time this can happen.   Health Maintenance:  Admits history of Bowel Changes - Declines colonoscopy. No other significant GI symptoms.   Depression screen [facility] 2/9 02/21/2020 09/24/2019 06/11/2019  Decreased Interest 2 1 0  Down, Depressed, Hopeless 3 2 1   PHQ - 2 Score 5 3 1   Altered sleeping 2 1 1   Tired, decreased energy 3 2 2   Change in appetite 3 2 0  Feeling bad or failure about yourself  2 1 0  Trouble concentrating 0 1 1  Moving slowly or fidgety/restless 0 1 0  Suicidal thoughts 0 0 0  PHQ-9 Score 15 11 5   Difficult doing work/chores Not difficult at all Not difficult at all Not difficult at all   GAD 7 : Generalized Anxiety Score 02/21/2020 09/24/2019 06/11/2019 01/28/2019  Nervous, Anxious, on Edge 3 1 0  3  Control/stop worrying 3 0 1 3  Worry too much - different things 3 1 1 2   Trouble relaxing 3 1 0 2  Restless 2 0 0 1  Easily annoyed or irritable 0 0 1 1  Afraid - awful might happen 3 1 0 1  Total GAD 7 Score 17 4 3 13   Anxiety Difficulty Not difficult at all Not difficult at all Not difficult at all Somewhat difficult     Social History   Tobacco Use  . Smoking status: Current Every Day Smoker    Packs/day: 0.50    Years: 52.00    Pack years: 26.00     Types: Cigarettes    Last attempt to quit: 02/13/2018    Years since quitting: 2.0  . Smokeless tobacco: Current User  . Tobacco comment: Resumed smoking, after quit 01/2018  Substance Use Topics  . Alcohol use: No    Alcohol/week: 1.0 standard drinks    Types: 1 Standard drinks or equivalent per week    Comment: occasionally drinks a margarita  . Drug use: No    Review of Systems Per HPI unless specifically indicated above     Objective:    BP 119/70   Pulse 79   Temp 98 F (36.7 C) (Temporal)   Resp 16   Ht 5\' 10"  (1.778 m)   Wt 200 lb (90.7 kg)   BMI 28.70 kg/m   Wt Readings from Last 3 Encounters:  02/21/20 200 lb (90.7 kg)  01/28/20 197 lb 6.4 oz (89.5 kg)  10/09/19 196 lb (88.9 kg)    Physical Exam Vitals and nursing note reviewed.  Constitutional:      General: He is not in acute distress.    Appearance: He is well-developed. He is not diaphoretic.     Comments: Well-appearing, comfortable, cooperative  HENT:     Head: Normocephalic and atraumatic.  Eyes:     General:        Right eye: No discharge.        Left eye: No discharge.     Conjunctiva/sclera: Conjunctivae normal.  Cardiovascular:     Rate and Rhythm: Normal rate.  Pulmonary:     Effort: Pulmonary effort is normal.  Skin:    General: Skin is warm and dry.     Findings: No erythema or rash.  Neurological:     Mental Status: He is alert and oriented to person, place, and time.  Psychiatric:        Behavior: Behavior normal.     Comments: Well groomed, good eye contact, normal speech and thoughts. Anxious and some labile mood at times with some down mood intermittently      I have personally reviewed the radiology report from 10/09/19 on LDCT.  CT CHEST LUNG CANCER SCREENING LOW DOSE WO CONTRASTPerformed 10/09/2019 Final result  Study Result CLINICAL DATA: 79 year old asymptomatic male current smoker with 44 pack-year smoking history.  EXAM: CT CHEST WITHOUT CONTRAST LOW-DOSE FOR LUNG  CANCER SCREENING  TECHNIQUE: Multidetector CT imaging of the chest was performed following the standard protocol without IV contrast.  COMPARISON: 10/02/2018 screening chest CT.  FINDINGS: Cardiovascular: Mild cardiomegaly, stable. Stable coarse mitral annular calcification and annuloplasty. No significant pericardial effusion/thickening. Three-vessel coronary atherosclerosis. Atherosclerotic thoracic aorta with stable ectatic 4.3 cm ascending thoracic aorta. Stable configuration 2 lead left subclavian pacemaker with lead tips in right ventricular apex and coronary sinus. Normal caliber main pulmonary artery.  Mediastinum/Nodes: No discrete thyroid nodules. Unremarkable esophagus. No  axillary adenopathy. Stable mild right paratracheal adenopathy up to 1.3 cm (series 2/image 25), most compatible with benign reactive adenopathy. No new pathologically enlarged mediastinal nodes. No discrete hilar adenopathy on this noncontrast scan.  Lungs/Pleura: No pneumothorax. No pleural effusion. Moderate centrilobular and paraseptal emphysema with diffuse bronchial wall thickening. No acute consolidative airspace disease or lung masses. No significant growth of previously visualized scattered pulmonary nodules. No new significant pulmonary nodules.  Upper abdomen: Scattered simple liver cysts, largest 2.5 cm.  Musculoskeletal: No aggressive appearing focal osseous lesions. Mild thoracic spondylosis.  IMPRESSION: 1. Lung-RADS 2, benign appearance or behavior. Continue annual screening with low-dose chest CT without contrast in 12 months. 2. Stable mild cardiomegaly and three-vessel coronary atherosclerosis. 3. Stable ectatic 4.3 cm ascending thoracic aorta. Recommend annual imaging followup by CTA or MRA. This recommendation follows 2010 ACCF/AHA/AATS/ACR/ASA/SCA/SCAI/SIR/STS/SVM Guidelines for the Diagnosis and Management of Patients with Thoracic Aortic Disease. Circulation. 2010; [id number]. Aortic aneurysm NOS (ICD10-I71.9).  Aortic Atherosclerosis (ICD10-I70.0) and Emphysema (ICD10-J43.9).    Results for orders placed or performed in visit on 01/29/20  CUP PACEART REMOTE DEVICE CHECK  Result Value Ref Range   Date Time Interrogation Session [id number]    Pulse Generator Manufacturer [employer]    Pulse Gen Model 3562 Quadra Allure MP    Pulse Gen Serial Number [id number]    Clinic Name [facility]    Implantable Pulse Generator Type Cardiac Resynch Therapy Pacemaker    Implantable Pulse Generator Implant Date [id number]    Implantable Lead Manufacturer [facility]    Implantable Lead Model 5076 CapSureFix Novus    Implantable Lead Serial Number [id number]    Implantable Lead Implant Date [id number]    Implantable Lead Location Detail 1 UNKNOWN    Implantable Lead Location [id number]    Implantable Lead Manufacturer [facility]    Implantable Lead Model 1458Q Quartet    Implantable Lead Serial Number [id number]    Implantable Lead Implant Date [id number]    Implantable Lead Location Detail 1 UNKNOWN    Implantable Lead Location [id number]    Lead Channel Setting Sensing Sensitivity 2.0 mV   Lead Channel Setting Sensing Adaptation Mode Fixed Pacing    Lead Channel Setting Pacing Pulse Width 0.5 ms   Lead Channel Setting Pacing Amplitude 2.5 V   Lead Channel Setting Pacing Pulse Width 0.5 ms   Lead Channel Setting Pacing Amplitude 2.5 V   Lead Channel Setting Pacing Capture Mode Fixed Pacing    Lead Channel Status NULL    Lead Channel Impedance Value 1,100 ohm   Lead Channel Pacing Threshold Amplitude 1.0 V   Lead Channel Pacing Threshold Pulse Width 0.5 ms   Lead Channel Status NULL    Lead Channel Impedance Value 540 ohm   Lead Channel Sensing Intrinsic Amplitude 12.0 mV   Lead Channel Pacing Threshold Amplitude 0.5 V   Lead Channel Pacing Threshold Pulse Width 0.5 ms   Battery Status MOS    Battery Remaining Longevity 96 mo   Battery Remaining Percentage 95.5 %   Battery  Voltage 3.01 V      Assessment & Plan:   Problem List Items Addressed This Visit    Generalized anxiety disorder - Primary    See A&P Depression Checked PDMP Currently controlled on daily BDZ Xanax low dose Now using 2nd dose less often Refilled Xanax 0.25mg  BID PRN, #60 pills +2 refills, should last longer than 3 months, sent rx yesterday 02/20/20      Depression, major, recurrent, moderate ([place])  Worsening depression, and secondary comorbid anxiety Multiple factors contributing to his depressed mood Prior history failed Psychology therapy - he is hesitant to resume this No prior significant SSRI anti depressant medication management in past Denies any SI / HI  Extensive discussion today on his life stressors and depressed mood. Reviewed that there are limited options that I have to solve the variety of problems - but my main goal is to encourage him to try best he can to feel more like himself and he can better process or handle these problems. We have arranged CCM for him in past, he has been in communication with case management. - Offered rx medication management for depression, but again he declines. He has hesitant with rx medications side effects, and we reviewed this and the benefit from these meds - Continue Xanax as he is using less of it now and using it appropriately for his underlying anxiety - Offer therapist / referral to Psychology/Therapist, he declines can reconsider whenever ready      Centrilobular emphysema ([place])    Currently controlled without maintenance therapy COPD seems to be less likely factor for his reduced exercise tolerance at this point No longer followed by Pulmonology No flare up Still active smoker, declines to quit         #Cardiology CHF Encouraged him to return to Cardiology as scheduled 04/2020  No orders of the defined types were placed in this encounter.    Follow up plan: Return in about 6 months (around 08/23/2020) for 6 month  follow-up mood / HTN anxiety / medications.  He may contact us within 3 months for refill xanax, does not require apt. Agree to fill based on his current usage now. He was advised he does need office visit every 6 months to continue this medication.  Nobie Putnam, DO [facility] 02/21/2020, 11:19 AM

## 2020-02-21 NOTE — Assessment & Plan Note (Addendum)
Worsening depression, and secondary comorbid anxiety Multiple factors contributing to his depressed mood Prior history failed Psychology therapy - he is hesitant to resume this No prior significant SSRI anti depressant medication management in past Denies any SI / HI  Extensive discussion today on his life stressors and depressed mood. Reviewed that there are limited options that I have to solve the variety of problems - but my main goal is to encourage him to try best he can to feel more like himself and he can better process or handle these problems. We have arranged CCM for him in past, he has been in communication with case management. - Offered rx medication management for depression, but again he declines. He has hesitant with rx medications side effects, and we reviewed this and the benefit from these meds - Continue Xanax as he is using less of it now and using it appropriately for his underlying anxiety - Offer therapist / referral to Psychology/Therapist, he declines can reconsider whenever ready

## 2020-02-21 NOTE — Assessment & Plan Note (Addendum)
Currently controlled without maintenance therapy COPD seems to be less likely factor for his reduced exercise tolerance at this point No longer followed by Pulmonology No flare up Still active smoker, declines to quit

## 2020-02-25 ENCOUNTER — Ambulatory Visit (INDEPENDENT_AMBULATORY_CARE_PROVIDER_SITE_OTHER): Payer: Medicare HMO | Admitting: General Practice

## 2020-02-25 ENCOUNTER — Telehealth: Payer: Self-pay | Admitting: General Practice

## 2020-02-25 DIAGNOSIS — I4821 Permanent atrial fibrillation: Secondary | ICD-10-CM

## 2020-02-25 DIAGNOSIS — E7800 Pure hypercholesterolemia, unspecified: Secondary | ICD-10-CM

## 2020-02-25 DIAGNOSIS — I5022 Chronic systolic (congestive) heart failure: Secondary | ICD-10-CM

## 2020-02-25 DIAGNOSIS — F411 Generalized anxiety disorder: Secondary | ICD-10-CM

## 2020-02-25 DIAGNOSIS — F331 Major depressive disorder, recurrent, moderate: Secondary | ICD-10-CM

## 2020-02-25 DIAGNOSIS — I272 Pulmonary hypertension, unspecified: Secondary | ICD-10-CM

## 2020-02-25 NOTE — Chronic Care Management (AMB) (Signed)
Chronic Care Management   Follow Up Note   02/25/2020 Name: Michael Delacruz MRN: [id number] DOB: 06/03/1941  Referred by: [name], DO Reason for referral : Chronic Care Management (Chronic Health conditions and care coordination needs )   Michael Delacruz is a 79 y.o. year old male who is a primary care patient of [name], DO. The CCM team was consulted for assistance with chronic disease management and care coordination needs.    Review of patient status, including review of consultants reports, relevant laboratory and other test results, and collaboration with appropriate care team members and the patient's provider was performed as part of comprehensive patient evaluation and provision of chronic care management services.    SDOH (Social Determinants of Health) assessments performed: Yes See Care Plan activities for detailed interventions related to [facility])     Outpatient Encounter Medications as of 02/25/2020  Medication Sig   ALPRAZolam (XANAX) 0.25 MG tablet TAKE 1 TABLET (0.25 MG TOTAL) BY MOUTH 2 (TWO) TIMES DAILY AS NEEDED FOR ANXIETY.   ascorbic acid (VITAMIN C) 500 MG tablet Take 500 mg by mouth daily.   calcium-vitamin D (OSCAL WITH D) 500-200 MG-UNIT tablet Take 1 tablet by mouth.   furosemide (LASIX) 40 MG tablet TAKE 1 TABLET BY MOUTH TWICE A DAY AS NEEDED   Homeopathic Products (HOMEOPATHIC CALM SL) Place under the tongue. Is using Doterra essential oil of Onguard ingestion daily   mupirocin ointment (BACTROBAN) 2 % Place 1 application into the nose 2 (two) times daily. For 7-10 days then as needed   Zinc Sulfate (ZINC 15 PO) Take by mouth.   No facility-administered encounter medications on file as of 02/25/2020.     Objective:  BP Readings from Last 3 Encounters:  02/21/20 119/70  09/24/19 125/73  06/11/19 (!) 118/91    Goals Addressed            This Visit's Progress    RN: I have a lot of problems (pt-stated)      Current Barriers:   Knowledge deficit related to basic heart failure pathophysiology and self care management  Knowledge Deficits related to heart failure medications  Financial strain  Cognitive Deficits-Patient reported memory issues  Case Manager Clinical Goal(s):   Over the next 120 days, patient will weigh self daily and record  Over the next 120 days, patient will verbalize understanding of Heart Failure Action Plan and when to call doctor  Over the next 120 days, patient will weigh daily and record (notifying MD of 3 lb weight gain over night or 5 lb in a week)  Over the next 120 days the patient will verbalize an increase of strength and no reported falls  Interventions:   Basic overview and discussion of pathophysiology of Heart Failure reviewed   Provided verbal education on low sodium diet, the patient has limited food resources and has been eating a lot of peanut butter and jelly sandwiches   Care guide referral to help with food resources for patient to be able to follow prescribed dietary restrictions  Advised patient to weigh each morning after emptying bladder  Discussed importance of daily weight and advised patient to weigh and record daily   (patient is weighing every other day- but not recording- encouraged keeping a record-yesterday 199  Reviewed role of diuretics in prevention of fluid overload and management of heart failure-the patient has had some water weight on board lately.   Advised patient, providing education and rationale, to weigh  daily and record, calling PCP for weight gain of 3lbs overnight or 5 pounds in a week. Patient reports weighing daily and taking his lasix when he is up in his weight. He reports his weight is 199 yesterday  Discussed with patient the importance of exercise in increasing endurance and strength which patient stated he wanted to do. The patient states he is not doing a lot of exercise. "I feel so lazy and useless" "I feel  like crap"- denies suicidal ideation   Empathetic listening, patient very talkative about his health, his past life and recent losses he has had in his family, recent death of his "baby" sister. Also behind on rent and utilities.  Discussed his frustrations with different things in his life. Does not want to fill out forms. This frustrates him as well.  Will report to LCSW patient's expressed grief for further exploration and assistance with financial constraints as appropriate.  Patient Self Care Activities:   Takes Heart Failure Medications as prescribed  Weighs daily and record (notifying MD of 3 lb weight gain over night or 5 lb in a week)  Verbalizes understanding of and follows CHF Action Plan  Adheres to low sodium diet  Please see past updates related to this goal by clicking on the "Past Updates" button in the selected goal       RN: I haven't been to the doctor in a while       Current Barriers:   Chronic Disease Management support, education, and care coordination needs related to Atrial Fibrillation, CHF, HLD, Anxiety, and high fall risk  Clinical Goal(s) related to Atrial Fibrillation, CHF, HLD, Anxiety, and high fall risk :  Over the next 120 days, patient will:   Work with the care management team to address educational, disease management, and care coordination needs   Begin or continue self health monitoring activities as directed today Measure and record blood pressure 7 times per week, Measure and record weight daily, and adhere to a heart Healthy diet  Call provider office for new or worsened signs and symptoms Blood pressure findings outside established parameters, Weight outside established parameters, Shortness of breath, and New or worsened symptom related to alterations in blood pressure, patient states it is going up and down  Call care management team with questions or concerns  Verbalize basic understanding of patient centered plan of care established  today  Interventions related to Atrial Fibrillation, CHF, HLD, Anxiety, and High fall risk :   Evaluation of current treatment plans and patient's adherence to plan as established by provider  Assessed patient understanding of disease states  Assessed patient's education and care coordination needs- sill limited income and resources, but does not wish to feel out paperwork- declined food resources at this time  Provided disease specific education to patient- review of available help and resources   Education on Heart Healthy diet and watching sodium content  Collaborated with appropriate clinical care team members regarding patient needs- continued pharmacy support and LCSW support  Patient Self Care Activities related to Atrial Fibrillation, CHF, HLD, Anxiety, and High fall risk :   Patient is unable to independently self-manage chronic health conditions  Please see past updates related to this goal by clicking on the "Past Updates" button in the selected goal          Plan:   The care management team will reach out to the patient again over the next 60 days.    Noreene Larsson RN, MSN, CCM Community  [place] Mobile: [phone]

## 2020-02-25 NOTE — Patient Instructions (Signed)
Visit Information  Goals Addressed            This Visit's Progress   . RN: I have a lot of problems (pt-stated)       Current Barriers:  [name] Knowledge deficit related to basic heart failure pathophysiology and self care management . Knowledge Deficits related to heart failure medications . Financial strain . Cognitive Deficits-Patient reported memory issues  Case Manager Clinical Goal(s):  [name] Over the next 120 days, patient will weigh self daily and record . Over the next 120 days, patient will verbalize understanding of Heart Failure Action Plan and when to call doctor . Over the next 120 days, patient will weigh daily and record (notifying MD of 3 lb weight gain over night or 5 lb in a week) . Over the next 120 days the patient will verbalize an increase of strength and no reported falls  Interventions:  . Basic overview and discussion of pathophysiology of Heart Failure reviewed  . Provided verbal education on low sodium diet, the patient has limited food resources and has been eating a lot of peanut butter and jelly sandwiches  . Care guide referral to help with food resources for patient to be able to follow prescribed dietary restrictions . Advised patient to weigh each morning after emptying bladder . Discussed importance of daily weight and advised patient to weigh and record daily  . (patient is weighing every other day- but not recording- encouraged keeping a record-yesterday 199 . Reviewed role of diuretics in prevention of fluid overload and management of heart failure-the patient has had some water weight on board lately.  . Advised patient, providing education and rationale, to weigh daily and record, calling PCP for weight gain of 3lbs overnight or 5 pounds in a week. Patient reports weighing daily and taking his lasix when he is up in his weight. He reports his weight is 199 yesterday . Discussed with patient the importance of exercise in increasing endurance and strength  which patient stated he wanted to do. The patient states he is not doing a lot of exercise. "I feel so lazy and useless" "I feel like crap"- denies suicidal ideation  . Empathetic listening, patient very talkative about his health, his past life and recent losses he has had in his family, recent death of his "baby" sister. Also behind on rent and utilities.  Discussed his frustrations with different things in his life. Does not want to fill out forms. This frustrates him as well.  Will report to LCSW patient's expressed grief for further exploration and assistance with financial constraints as appropriate.  Patient Self Care Activities:  . Takes Heart Failure Medications as prescribed . Weighs daily and record (notifying MD of 3 lb weight gain over night or 5 lb in a week) . Verbalizes understanding of and follows CHF Action Plan . Adheres to low sodium diet  Please see past updates related to this goal by clicking on the "Past Updates" button in the selected goal      . RN: I haven't been to the doctor in a while       Current Barriers:  . Chronic Disease Management support, education, and care coordination needs related to Atrial Fibrillation, CHF, HLD, Anxiety, and high fall risk  Clinical Goal(s) related to Atrial Fibrillation, CHF, HLD, Anxiety, and high fall risk :  Over the next 120 days, patient will:  . Work with the care management team to address educational, disease management, and care coordination needs  .  Begin or continue self health monitoring activities as directed today Measure and record blood pressure 7 times per week, Measure and record weight daily, and adhere to a heart Healthy diet . Call provider office for new or worsened signs and symptoms Blood pressure findings outside established parameters, Weight outside established parameters, Shortness of breath, and New or worsened symptom related to alterations in blood pressure, patient states it is going up and down . Call  care management team with questions or concerns . Verbalize basic understanding of patient centered plan of care established today  Interventions related to Atrial Fibrillation, CHF, HLD, Anxiety, and High fall risk :  . Evaluation of current treatment plans and patient's adherence to plan as established by provider . Assessed patient understanding of disease states . Assessed patient's education and care coordination needs- sill limited income and resources, but does not wish to feel out paperwork- declined food resources at this time . Provided disease specific education to patient- review of available help and resources  . Education on Heart Healthy diet and watching sodium content . Collaborated with appropriate clinical care team members regarding patient needs- continued pharmacy support and LCSW support  Patient Self Care Activities related to Atrial Fibrillation, CHF, HLD, Anxiety, and High fall risk :  . Patient is unable to independently self-manage chronic health conditions  Please see past updates related to this goal by clicking on the "Past Updates" button in the selected goal         Patient verbalizes understanding of instructions provided today.   The care management team will reach out to the patient again over the next 60 days.    Noreene Larsson RN, MSN, [place] Mobile: [phone]

## 2020-02-27 ENCOUNTER — Ambulatory Visit: Payer: Self-pay | Admitting: Pharmacist

## 2020-02-27 DIAGNOSIS — I4821 Permanent atrial fibrillation: Secondary | ICD-10-CM | POA: Diagnosis not present

## 2020-02-27 DIAGNOSIS — F331 Major depressive disorder, recurrent, moderate: Secondary | ICD-10-CM | POA: Diagnosis not present

## 2020-02-27 DIAGNOSIS — I4891 Unspecified atrial fibrillation: Secondary | ICD-10-CM | POA: Diagnosis not present

## 2020-02-27 DIAGNOSIS — I5022 Chronic systolic (congestive) heart failure: Secondary | ICD-10-CM | POA: Diagnosis not present

## 2020-02-27 DIAGNOSIS — E7800 Pure hypercholesterolemia, unspecified: Secondary | ICD-10-CM | POA: Diagnosis not present

## 2020-02-27 DIAGNOSIS — I5032 Chronic diastolic (congestive) heart failure: Secondary | ICD-10-CM | POA: Diagnosis not present

## 2020-02-27 NOTE — Chronic Care Management (AMB) (Signed)
Chronic Care Management   Follow Up Note   02/27/2020 Name: Michael Delacruz MRN: [id number] DOB: 1941/06/04  Referred by: [name], DO Reason for referral : Chronic Care Management (Patient Phone Call)   Michael Delacruz is a 79 y.o. year old male who is a primary care patient of [name], DO. The CCM team was consulted for assistance with chronic disease management and care coordination needs.    Receive a referral from [facility] for medication management  I reached out to Colin Benton by phone today as scheduled to complete medication review. Speak with patient briefly as he states that he is just about to head out of the house, but denies need to reschedule.  Review of patient status, including review of consultants reports, relevant laboratory and other test results, and collaboration with appropriate care team members and the patient's provider was performed as part of comprehensive patient evaluation and provision of chronic care management services.     Outpatient Encounter Medications as of 02/27/2020  Medication Sig  . ALPRAZolam (XANAX) 0.25 MG tablet TAKE 1 TABLET (0.25 MG TOTAL) BY MOUTH 2 (TWO) TIMES DAILY AS NEEDED FOR ANXIETY.  [name] ascorbic acid (VITAMIN C) 500 MG tablet Take 500 mg by mouth daily.  . calcium-vitamin D (OSCAL WITH D) 500-200 MG-UNIT tablet Take 1 tablet by mouth.  . furosemide (LASIX) 40 MG tablet TAKE 1 TABLET BY MOUTH TWICE A DAY AS NEEDED  . Homeopathic Products (HOMEOPATHIC CALM SL) Place under the tongue. Is using Doterra essential oil of Onguard ingestion daily  . Zinc Sulfate (ZINC 15 PO) Take by mouth.  . [DISCONTINUED] mupirocin ointment (BACTROBAN) 2 % Place 1 application into the nose 2 (two) times daily. For 7-10 days then as needed (Patient not taking: Reported on 02/27/2020)   No facility-administered encounter medications on file as of 02/27/2020.    Goals Addressed            This  Visit's Progress   . COMPLETED: PharmD - Medication Review       CARE PLAN ENTRY (see longtitudinal plan of care for additional care plan information)     Current Barriers:  . Chronic Disease Management support, education, and care coordination needs related to Atrial Fibrillation, CHF, HLD, Anxiety  Pharmacist Clinical Goal(s):  [name] Over the next 30 days, patient will work with CM Pharmacist to review medications and address and medication needs identified  Interventions: . Complete medication review with patient briefly, but patient denies currently having medications in front of him . Reports taking On Guard supplement as suggested by his wife  - Counsel patient regarding lack of data to confirm whether this supplement is safe, effective or could interact with his medications.  . Encourage patient to schedule f/u with Cardiologist . Counsel on COVID-19 virus and vaccines o Reports not interested in getting COVID-19 vaccine. States that he tends to not want to do something when lots of people are telling him to. . Denies medication questions/concerns at this time.  Patient Self Care Activities:  . To call provider office for new concerns or questions   Please see past updates related to this goal by clicking on the "Past Updates" button in the selected goal         Plan  1) Patient denies any medication questions/concerns at this time. 2) The patient has been provided with contact information for CM Pharmacist and has been advised to call with any medication related  questions or concerns.   Harlow Asa, PharmD, [place] [employer] [phone]

## 2020-02-27 NOTE — Patient Instructions (Signed)
Thank you allowing the Chronic Care Management Team to be a part of your care! It was a pleasure speaking with you today!     CCM (Chronic Care Management) Team    Noreene Larsson RN, MSN, CCM Nurse Care Coordinator  [phone]   Harlow Asa PharmD  Clinical Pharmacist  [phone]   Eula Fried LCSW Clinical Social Worker [phone]  Visit Information  Goals Addressed            This Visit's Progress   . COMPLETED: PharmD - Medication Review       CARE PLAN ENTRY (see longtitudinal plan of care for additional care plan information)     Current Barriers:  . Chronic Disease Management support, education, and care coordination needs related to Atrial Fibrillation, CHF, HLD, Anxiety  Pharmacist Clinical Goal(s):  [name] Over the next 30 days, patient will work with CM Pharmacist to review medications and address and medication needs identified  Interventions: . Complete medication review with patient briefly, but patient denies currently having medications in front of him . Reports taking On Guard supplement as suggested by his wife  - Counsel patient regarding lack of data to confirm whether this supplement is safe, effective or could interact with his medications.  . Encourage patient to schedule f/u with Cardiologist . Counsel on COVID-19 virus and vaccines o Reports not interested in getting COVID-19 vaccine. States that he tends to not want to do something when lots of people are telling him to. . Denies medication questions/concerns at this time.  Patient Self Care Activities:  . To call provider office for new concerns or questions   Please see past updates related to this goal by clicking on the "Past Updates" button in the selected goal         Patient verbalizes understanding of instructions provided today.   The patient has been provided with contact information for the care management team and has been advised to call with any health related  questions or concerns.   Harlow Asa, PharmD, [place] [employer] [phone]

## 2020-03-07 ENCOUNTER — Telehealth: Payer: Self-pay | Admitting: Family Medicine

## 2020-03-07 NOTE — Telephone Encounter (Signed)
   KNB 03/07/2020  Name: Michael Delacruz   MRN: [id number]   DOB: 07/12/1941   AGE: 79 y.o.   GENDER: male   PCP [name], DO.   Called pt regarding [employer] Referral for assistance with rent, [place]. The patient states he only gets 800.00 per month in disability and he is 3 months behind on his rent. His wife is unemployed.  The patient could use any help with resources for rent/utlilities and food. Care Guide worked with patient in March 2020 to apply for [facility] but was not able to provide disability payment that his wife received so it was put on hold. Pt provided the amount today of $900 that she receives in disability. Will re-apply for [facility] and follow up with missing information. Pt to get landlord contact information and let me know so that I can request signed w9 form.  Follow up on: 03/11/20   [place] . [email or web address]  [phone]

## 2020-03-11 ENCOUNTER — Ambulatory Visit: Payer: Self-pay | Admitting: Licensed Clinical Social Worker

## 2020-03-11 DIAGNOSIS — R296 Repeated falls: Secondary | ICD-10-CM

## 2020-03-11 DIAGNOSIS — I4891 Unspecified atrial fibrillation: Secondary | ICD-10-CM

## 2020-03-11 DIAGNOSIS — F331 Major depressive disorder, recurrent, moderate: Secondary | ICD-10-CM

## 2020-03-11 DIAGNOSIS — I5022 Chronic systolic (congestive) heart failure: Secondary | ICD-10-CM

## 2020-03-11 DIAGNOSIS — F411 Generalized anxiety disorder: Secondary | ICD-10-CM

## 2020-03-11 DIAGNOSIS — I4821 Permanent atrial fibrillation: Secondary | ICD-10-CM | POA: Diagnosis not present

## 2020-03-11 DIAGNOSIS — E7800 Pure hypercholesterolemia, unspecified: Secondary | ICD-10-CM | POA: Diagnosis not present

## 2020-03-11 DIAGNOSIS — I5032 Chronic diastolic (congestive) heart failure: Secondary | ICD-10-CM

## 2020-03-11 NOTE — Chronic Care Management (AMB) (Signed)
Chronic Care Management    Clinical Social Work Follow Up Note  03/11/2020 Name: Michael Delacruz MRN: [id number] DOB: Aug 19, 1941  Michael Delacruz is a 79 y.o. year old male who is a primary care patient of [name], DO. The CCM team was consulted for assistance with Mental Health Counseling and Resources.   Review of patient status, including review of consultants reports, other relevant assessments, and collaboration with appropriate care team members and the patient's provider was performed as part of comprehensive patient evaluation and provision of chronic care management services.    SDOH (Social Determinants of Health) assessments performed: Yes    Outpatient Encounter Medications as of 03/11/2020  Medication Sig  . ALPRAZolam (XANAX) 0.25 MG tablet TAKE 1 TABLET (0.25 MG TOTAL) BY MOUTH 2 (TWO) TIMES DAILY AS NEEDED FOR ANXIETY.  [name] ascorbic acid (VITAMIN C) 500 MG tablet Take 500 mg by mouth daily.  . calcium-vitamin D (OSCAL WITH D) 500-200 MG-UNIT tablet Take 1 tablet by mouth.  . furosemide (LASIX) 40 MG tablet TAKE 1 TABLET BY MOUTH TWICE A DAY AS NEEDED  . Homeopathic Products (HOMEOPATHIC CALM SL) Place under the tongue. Is using Doterra essential oil of Onguard ingestion daily  . Zinc Sulfate (ZINC 15 PO) Take by mouth.   No facility-administered encounter medications on file as of 03/11/2020.     Goals Addressed    . SW-"I need to take better care of myself." (pt-stated)       Current Barriers:  . Financial constraints related to managing health care . Housing barriers . ADL IADL limitations . Social Isolation . Ongoing anxiety and depression  Clinical Social Work Clinical Goal(s):  [name] Over the next 90 days, client will work with SW to address concerns related to knowledge deficit of appropriate self-care and depression management tools to implement into his daily routine to promote healthy living   Interventions: . Patient interviewed and  appropriate assessments performed . Provided patient with information about what healthy self-care looks like . Discussed plans with patient for ongoing care management follow up and provided patient with direct contact information for care management team . Advised patient to consider grief therapy due to the loss he has experienced this year. Patient declined referral but was appreciative of education on resource. UPDATE- Patient declined grief counseling referral again on 03/11/2020. . Assisted patient/caregiver with obtaining information about health plan benefits . Provided education on available community resources within the local area that patient may benefit from. . Provided mental health counseling with regard to his daily difficulty with mobility/weakness/balance. Patient has frequent falls and does not have a medical alert system. LCSW used active and reflective listening and implemented appropriate interventions to help suppport patient and his emotional needs. Advised patient to implement deep breathing/grounding/meditation/self-care exercises into his daily routine to combat stressors. Education provided.  . Patient is 3 months late on rent and is experiencing food insecurity. Patient reports that his financial difficulties have increased his stress, anxiety and depression. . Patient ask if C3 Guide spoke with his landlord. LCSW will send in basket message to her requesting this question. C3 Guide is currently working on [employer] application for patient (regarding rent assistance).  Patient Self Care Activities:  . Attends all scheduled provider appointments . Calls provider office for new concerns or questions  Please see past updates related to this goal by clicking on the "Past Updates" button in the selected goal       Follow Up Plan: SW  will follow up with patient by phone over the next quarter  Eula Fried, [place], MSW, [email or web address] Phone: [phone]

## 2020-03-11 NOTE — Telephone Encounter (Signed)
   KNB 03/11/2020 phone call with Landlord with request for signed w9  Name: Michael Delacruz   MRN: [id number]   DOB: 1941/01/18   AGE: 79 y.o.   GENDER: male   PCP [name], DO.   Called pt regarding Community Resource Referral for rent assistance Follow up on: 03/12/2020  [place] . [email or web address]  [phone]

## 2020-03-11 NOTE — Telephone Encounter (Signed)
Email to Landlord  From: Jill Alexanders [facility])  Sent: Tuesday, March 11, 2020 1:52 PM To: '[email or web address]> Subject: SECURE: Request for Signed w9 and Invoice  Good Afternoon Ms. Wynetta Emery,  I am a Care Guide with [facility] and I am working to submit an application for financial assistance for Mr. Jemere Sancho for help toward paying his rent payment for April. Thank you for speaking with me today over the phone.  In order for [place] to submit the application for Mr. Lamson, will you please reply back/confirm the following information below:   Remit Payment to:  Tenant: Mr. Jamus Recla Address:   [place], [place] [address] Amount Due: $1100 Amount Due Date:   We will also need a signed, completed copy of your W9, I've included a blank one attached if you should need it. The Remit to: Address should match the w9 form.  Thank you, [place] . [email or web address]  [phone]

## 2020-03-13 NOTE — Telephone Encounter (Signed)
   KNB 03/13/2020 1st Attempt  Name: Michael Delacruz   MRN: [id number]   DOB: 04-11-1941   AGE: 79 y.o.   GENDER: male   PCP [name], DO.   Called pt regarding [employer] Referral for financial assistance update with status of application for [facility].  [place] . [email or web address]  [phone]

## 2020-03-13 NOTE — Telephone Encounter (Signed)
Email to landlord From: Jill Alexanders [facility])  Sent: Thursday, March 13, 2020 8:36 AM To: Lenon Curt [email or web address]> Subject: RE: Following Up-SECURE: Request for Signed w9 and Invoice  Could you also provide an invoice I could submit to accounts payable? Thank you!    From: Lenon Curt [email or web address]>  Sent: Wednesday, March 12, 2020 2:55 PM To: Jill Alexanders [facility]) [email or web address]> Subject: Re: Following Up-SECURE: Request for Signed w9 and Invoice  *Caution - External email - see footer for warnings* Hi Curt Bears,  Thanks for the help.  Mr. Jemelle Jansma is behind rent for Jan and Feb. 2021. Now March rent is overdue, due date: March 09, 2020. Due amount: $1100. Please make payment to [facility] [address] [place],  [address].  Please see the signed W-9 form.  Thank you,  Lenon Curt [phone] (cell)

## 2020-03-14 NOTE — Telephone Encounter (Signed)
Email to [facility]  From: Jill Alexanders [facility])  Sent: Friday, March 14, 2020 3:32 PM To: Michael Delacruz ([email or web address]) [email or web address]>; Burnett Harry [email or web address]) [email or web address]> Subject: C3 Application E. Vandezande  Hi Michael and Michael Delacruz, This patient showed up as a new referral placed this year. I have submitted a new application for help paying his rent. (see thread below) The application has been saved to the Sharepoint site with w9 and invoice. Let me know if you have any questions.

## 2020-03-18 ENCOUNTER — Ambulatory Visit: Payer: Medicare HMO

## 2020-03-18 NOTE — Telephone Encounter (Signed)
   KNB 03/18/2020  Name: Michael Delacruz   MRN: [id number]   DOB: Jul 27, 1941   AGE: 79 y.o.   GENDER: male   PCP [name], DO.   Called pt regarding [employer] Referral for [employer] he will be by [facility] tomorrow after 1:30pm to pick up from [place].  Closing referral pending any other needs of patient.     [place] . [email or web address]  [phone]

## 2020-03-18 NOTE — Telephone Encounter (Signed)
   KNB 03/18/2020 LMTCB Name: Michael Delacruz   MRN: [id number]   DOB: 10-28-1941   AGE: 79 y.o.   GENDER: male   PCP [name], DO.   Called pt regarding Insurance account manager for [employer] for [employer]. Question from [place] Mr. Ahmad Magann, do you know if the landlord plans to evict?  He's way behind and I'm not sure $500 will help his issues.  As usual, I have to do my due diligence.     Follow up on: 03/19/20  [place] . [email or web address]  [phone]

## 2020-03-18 NOTE — Telephone Encounter (Signed)
   KNB 03/18/2020 1st Attempt  Name: Michael Delacruz   MRN: [id number]   DOB: 28-May-1941   AGE: 80 y.o.   GENDER: male   PCP [name], DO.   Called pt regarding Community Resource Referral and to follow up on Eviction help and coordinating pick up of grocery gift card  Follow up on: 03/19/20 regarding pick up at [place] . [email or web address]  [phone]

## 2020-03-18 NOTE — Telephone Encounter (Signed)
Email to patient   From: Jill Alexanders [facility])  Sent: Tuesday, March 18, 2020 3:37 PM To: [email or web address] Subject: SECURE: [place] Resources  Good Afternoon Michael Delacruz, Please see attached form for [employer].  The Housing Opportunities and Prevention of Evictions (HOPE) Program is a statewide initiative that may provide rent and utility assistance to eligible low- and moderate-income renters experiencing financial hardship due to the economic effects of COVID-19. Now through June 18, 2020, renters in [place] cannot be evicted for non-payment of rent if you have: [name] Been unable to pay your rent due to the pandemic because you have lost income and/or have new expenses such as childcare or medical expenses, and . Applied for the [employer] by completing an application online or by speaking with a 211 specialist, OR . Completed the [employer] Form and presented a copy to your landlord via email, [place] mail or hand delivery. By taking these steps, you have protected your household from eviction. If your landlord still attempts to evict you, contact [employer] [facility] [place] at 902 658 1919 or HDTapes.co.nz.  The HOPE Program is not currently accepting applications for financial assistance, here is the website if you'd like to check back ShoeShineMachines.tn  I have also attached housing resources for [facility]. Once I hear back from the [facility] regarding the pick up time frame for your grocery gift card I will let you know.   Please let me know if you need anything further,  [place] . [email or web address]  [phone]

## 2020-03-18 NOTE — Telephone Encounter (Signed)
Call from [place] at [facility] she agreed to $500 gift card for Mr. Finfrock for groceries doesn't feel comfortable making a payment to landlord when after 4/1 they will be $4K behind on rent payments. Will let pt know and also give him information on other housing options as well eviction protection. knb

## 2020-04-24 ENCOUNTER — Telehealth: Payer: Self-pay

## 2020-04-28 ENCOUNTER — Other Ambulatory Visit: Payer: Self-pay | Admitting: Cardiovascular Disease

## 2020-04-29 ENCOUNTER — Ambulatory Visit (INDEPENDENT_AMBULATORY_CARE_PROVIDER_SITE_OTHER): Payer: Medicare HMO | Admitting: *Deleted

## 2020-04-29 DIAGNOSIS — I4821 Permanent atrial fibrillation: Secondary | ICD-10-CM

## 2020-04-29 LAB — CUP PACEART REMOTE DEVICE CHECK
Battery Remaining Longevity: 95 mo
Battery Remaining Percentage: 95.5 %
Battery Voltage: 3.01 V
Date Time Interrogation Session: 20210511093453
Implantable Lead Implant Date: 20190515
Implantable Lead Implant Date: 20190515
Implantable Lead Location: 753858
Implantable Lead Location: 753860
Implantable Lead Model: 5076
Implantable Pulse Generator Implant Date: 20190515
Lead Channel Impedance Value: 1050 Ohm
Lead Channel Impedance Value: 540 Ohm
Lead Channel Pacing Threshold Amplitude: 0.5 V
Lead Channel Pacing Threshold Amplitude: 1 V
Lead Channel Pacing Threshold Pulse Width: 0.5 ms
Lead Channel Pacing Threshold Pulse Width: 0.5 ms
Lead Channel Sensing Intrinsic Amplitude: 12 mV
Lead Channel Setting Pacing Amplitude: 2.5 V
Lead Channel Setting Pacing Amplitude: 2.5 V
Lead Channel Setting Pacing Pulse Width: 0.5 ms
Lead Channel Setting Pacing Pulse Width: 0.5 ms
Lead Channel Setting Sensing Sensitivity: 2 mV
Pulse Gen Model: 3562
Pulse Gen Serial Number: 9427031

## 2020-05-01 NOTE — Progress Notes (Signed)
Remote pacemaker transmission.   

## 2020-05-12 ENCOUNTER — Other Ambulatory Visit: Payer: Self-pay | Admitting: Cardiovascular Disease

## 2020-05-12 NOTE — Telephone Encounter (Signed)
Please schedule 12 month F/U with Dr. [name]. Thank yo!

## 2020-05-16 NOTE — Telephone Encounter (Signed)
Please schedule 12 month F/U with Dr. [name]. Thank you!

## 2020-05-16 NOTE — Telephone Encounter (Signed)
Scheduled on 6/2.

## 2020-05-20 ENCOUNTER — Ambulatory Visit: Payer: Self-pay

## 2020-05-20 NOTE — Chronic Care Management (AMB) (Signed)
  Care Management   Follow Up Note   05/20/2020 Name: Michael Delacruz MRN: [id number] DOB: 06-Sep-1941  Referred by: [name], DO Reason for referral : [place] is a 79 y.o. year old male who is a primary care patient of [name], DO. The care management team was consulted for assistance with care management and care coordination needs.    Review of patient status, including review of consultants reports, relevant laboratory and other test results, and collaboration with appropriate care team members and the patient's provider was performed as part of comprehensive patient evaluation and provision of chronic care management services.    LCSW completed CCM outreach attempt today but was unable to reach patient successfully. A HIPPA compliant voice message was left encouraging patient to return call once available. LCSW rescheduled CCM SW appointment as well.  A HIPPA compliant phone message was left for the patient providing contact information and requesting a return call.   The care management team is available to follow up with the patient after provider conversation with the patient regarding recommendation for care management engagement and subsequent re-referral to the care management team.   Eula Fried, BSW, MSW, [email or web address] Phone: [phone]

## 2020-05-21 ENCOUNTER — Ambulatory Visit (INDEPENDENT_AMBULATORY_CARE_PROVIDER_SITE_OTHER): Payer: Medicare HMO | Admitting: Family

## 2020-05-21 ENCOUNTER — Other Ambulatory Visit: Payer: Self-pay

## 2020-05-21 ENCOUNTER — Encounter: Payer: Self-pay | Admitting: Family

## 2020-05-21 VITALS — BP 120/80 | HR 80 | Ht 71.0 in | Wt 201.1 lb

## 2020-05-21 DIAGNOSIS — I428 Other cardiomyopathies: Secondary | ICD-10-CM | POA: Diagnosis not present

## 2020-05-21 DIAGNOSIS — I442 Atrioventricular block, complete: Secondary | ICD-10-CM

## 2020-05-21 DIAGNOSIS — I4821 Permanent atrial fibrillation: Secondary | ICD-10-CM | POA: Diagnosis not present

## 2020-05-21 NOTE — Patient Instructions (Addendum)
Medication Instructions:  No medication changes today.  *If you need a refill on your cardiac medications before your next appointment, please call your pharmacy*   Lab Work: Your physician recommends that you return for lab work today: CBC, CMET  If you have labs (blood work) drawn today and your tests are completely normal, you will receive your results only by: [name] MyChart Message (if you have MyChart) OR . A paper copy in the mail If you have any lab test that is abnormal or we need to change your treatment, we will call you to review the results.   Testing/Procedures:  Your EKG today shows your pacemaker is functioning appropriately.   Follow-Up: At [facility], you and your health needs are our priority.  As part of our continuing mission to provide you with exceptional heart care, we have created designated Provider Care Teams.  These Care Teams include your primary Cardiologist (physician) and Advanced Practice Providers (APPs -  Physician Assistants and Nurse Practitioners) who all work together to provide you with the care you need, when you need it.  We recommend signing up for the patient portal called "MyChart".  Sign up information is provided on this After Visit Summary.  MyChart is used to connect with patients for Virtual Visits (Telemedicine).  Patients are able to view lab/test results, encounter notes, upcoming appointments, etc.  Non-urgent messages can be sent to your provider as well.   To learn more about what you can do with MyChart, go to [email or web address].    Your next appointment:   In 3 months with Dr. Caryl Comes In 6 months with Dr. [name]   Other Instructions

## 2020-05-21 NOTE — Progress Notes (Signed)
Office Visit    Patient Name: Michael Delacruz Date of Encounter: 05/21/2020  Primary Care Provider:  [name], DO Primary Cardiologist:  Ida Rogue, MD Electrophysiologist:  None   Chief Complaint    Michael Delacruz is a 79 y.o. male with a hx of permanent atrial fibrillation, atrial flutter, previous TIA/stroke who declines anticoagulation, mild to moderate dilation of aortic root 4.3 cm by echo 11/2017, complete heart block s/p PCM, nonischemic cardiomyopathy (EF 30-35% 11/2018), nonobstructive CAD, presents today for follow up of cardiomyopathy.    Past Medical History    Past Medical History:  Diagnosis Date  . Arthritis   . Asthma   . Bradycardia   . CAD in native artery    a. LHC 09/2015: 40% pCx, 35% mRCA.  [name] COPD (chronic obstructive pulmonary disease) ([place])   . Dilation of intestine 01/2015  . Fibromyalgia   . Frequent headaches   . GERD (gastroesophageal reflux disease)   . History of blood clots    eye   . History of hiatal hernia   . History of rheumatic fever   . Hypertension   . Kidney stone   . PAF (paroxysmal atrial fibrillation) ([place])    a. s/p TEE/DCCV 07/2015. b. H/o bleeding on Coumadin when INR >5, changed to Eliquis.  . S/P Minimally invasive maze operation for atrial fibrillation 10/22/2015   Complete bilateral atrial lesion set using cryothermy and bipolar radiofrequency ablation with clipping of LA appendage via right mini thoracotomy approach  . S/P minimally invasive mitral valve repair 10/22/2015   Complex valvuloplasty including triangular resection of posterior leaflet, artificial Gore-tex neochord placement x6 and 38 mm Sorin Memo 3D Rechord ring annuloplasty via right minithoracotomy approach  . Severe mitral regurgitation    a. s/p MV repair 10/2015.  [name] Sleep apnea   . Stroke ([place])   . Thyroid disorder   . TIA (transient ischemic attack)   . Tobacco abuse    Past Surgical History:  Procedure Laterality Date  . ANKLE  SURGERY    . AV NODE ABLATION N/A 05/04/2018   Procedure: AV NODE ABLATION;  Surgeon: Thompson Grayer, MD;  Location: [place] [facility];  Service: Cardiovascular;  Laterality: N/A;  . BIV PACEMAKER INSERTION CRT-P N/A 05/03/2018   Procedure: BIV PACEMAKER INSERTION CRT-P;  Surgeon: Deboraha Sprang, MD;  Location: [place] [facility];  Service: Cardiovascular;  Laterality: N/A;  . CARDIAC CATHETERIZATION N/A 10/03/2015   Procedure: Right and Left Heart Cath and Coronary Angiography;  Surgeon: [name], MD;  Location: [place] [facility];  Service: Cardiovascular;  Laterality: N/A;  . COLONOSCOPY    . ELECTROPHYSIOLOGIC STUDY N/A 08/18/2015   Procedure: CARDIOVERSION;  Surgeon: Wellington Hampshire, MD;  Location: [facility];  Service: Cardiovascular;  Laterality: N/A;  . MINIMALLY INVASIVE MAZE PROCEDURE N/A 10/22/2015   Procedure: MINIMALLY INVASIVE MAZE PROCEDURE;  Surgeon: Rexene Alberts, MD;  Location: [place];  Service: Open Heart Surgery;  Laterality: N/A;  . MITRAL VALVE REPAIR Right 10/22/2015   Procedure: MINIMALLY INVASIVE MITRAL VALVE REPAIR (MVR);  Surgeon: Rexene Alberts, MD;  Location: [place];  Service: Open Heart Surgery;  Laterality: Right;  . SINUS EXPLORATION    . TEE WITHOUT CARDIOVERSION N/A 08/18/2015   Procedure: TRANSESOPHAGEAL ECHOCARDIOGRAM (TEE);  Surgeon: Wellington Hampshire, MD;  Location: [facility];  Service: Cardiovascular;  Laterality: N/A;  . TEE WITHOUT CARDIOVERSION N/A 10/22/2015   Procedure: TRANSESOPHAGEAL ECHOCARDIOGRAM (TEE);  Surgeon: Braulio Conte  Keturah Barre, MD;  Location: [place];  Service: Open Heart Surgery;  Laterality: N/A;    Allergies  Allergies  Allergen Reactions  . Codeine Nausea Only  . Digoxin And Related     SOB, bad dreams  . Macrodantin [Nitrofurantoin Macrocrystal] Rash  . Morphine And Related Rash    History of Present Illness    Michael Delacruz is a 79 y.o. male with a hx of permanent atrial fibrillation, atrial flutter, previous TIA/stroke who  declines anticoagulation, mild to moderate dilation of aortic root 4.3 cm by echo 11/2017, complete heart block s/p PCM, nonischemic cardiomyopathy (EF 30-35% 11/2018), s/p mitral valve repair by Dr. Zenia Resides 10/2015, tobacco abuse.  He was last seen via telemedicine 04/30/2019 by Dr. [name].  Noted long smoking history for 50 years with underlying COPD.  Very complex medical history and difficulty with compliance.  He underwent successful mitral valve repair via right thoracotomy by Dr. Roxy Manns 10/2015.  He had history of bleeding while on warfarin for PAF.  Did undergo maze and clipping of left atrial appendage during his MV repair.  04/2018 admitted for acute systolic heart failure with symptom of impending doom and noted to be in atrial flutter with uncontrolled rate.  Underwent CRT-P and AV junction ablation.  He is intolerant to multiple medications including Entresto with hypotension, amiodarone with nightmares.  Will get a fluttering feeling in his left lower abdomen. Tells me he can see it moving when he looks.   Tells me two days ago he walked a mile on the track. Yesterday he walked a mile plus one lap. Feels better when he walks.   Takes Lasix 40mg  daily. Will take an additional tablet as needed for weight gain/fluid retention approximately twice per week.   Tells me his DOE is stable.   Tells me he takes his Lasix 40mg  daily most days. Takes it twice per day a few times per month. Not following strictly low sodium diet.   Reports blood in his stool, "funnyshaped stool". Difficult historian to follow. Does endorse hemorrhoids. Encouraged to follow up with his PCP.   EKGs/Labs/Other Studies Reviewed:   The following studies were reviewed today:  Echo 11/2018 - Left ventricle: The cavity size was normal. There was moderate   concentric hypertrophy. Systolic function was moderately to   severely reduced. The estimated ejection fraction was in the   range of 30% to 35%. Diffuse  hypokinesis. Severe hypokinesis of   the inferior myocardium. The study is not technically sufficient   to allow evaluation of LV diastolic function. - Aortic root: The aortic root was mild to moderately dilated 4.3   cm - Ascending aorta: The ascending aorta was mildly dilated, 4.1 cm - Mitral valve: Prior procedures included surgical repair. The   findings are consistent with mild stenosis. Mean gradient (D): 5   mm Hg. - Left atrium: The atrium was mildly dilated. - Right ventricle: Pacer wire or catheter noted in right ventricle.   Systolic function was mildly reduced. - Pulmonary arteries: Systolic pressure could not be accurately   estimated.  EKG:  EKG is ordered today.  The ekg ordered today demonstrates v-paced 80 bpm with presence of bi-v PPM. No acute ST/T wave changes. QRS 127ms. Underlying rhythm appears to be atrial flutter based on V1 interpretation.   Recent Labs: No results found for requested labs within last 8760 hours.  Recent Lipid Panel    Component Value Date/Time   CHOL 215 (H) 05/01/2018 1023  TRIG 79 05/01/2018 1023   HDL 42 05/01/2018 1023   CHOLHDL 5.1 05/01/2018 1023   VLDL 16 05/01/2018 1023   LDLCALC 157 (H) 05/01/2018 1023    Home Medications   Current Meds  Medication Sig  . ALPRAZolam (XANAX) 0.25 MG tablet TAKE 1 TABLET (0.25 MG TOTAL) BY MOUTH 2 (TWO) TIMES DAILY AS NEEDED FOR ANXIETY.  [name] ascorbic acid (VITAMIN C) 500 MG tablet Take 500 mg by mouth daily.  . calcium-vitamin D (OSCAL WITH D) 500-200 MG-UNIT tablet Take 1 tablet by mouth.  . furosemide (LASIX) 40 MG tablet TAKE 1 TABLET BY MOUTH TWICE A DAY AS NEEDED  . Homeopathic Products (HOMEOPATHIC CALM SL) Place under the tongue. Is using Doterra essential oil of Onguard ingestion daily  . Zinc Sulfate (ZINC 15 PO) Take by mouth.    Review of Systems       Review of Systems  Constitution: Negative for chills, fever and malaise/fatigue.  Cardiovascular: Positive for dyspnea on  exertion. Negative for chest pain, leg swelling, near-syncope, orthopnea, palpitations and syncope.  Respiratory: Negative for cough, shortness of breath and wheezing.   Gastrointestinal: Negative for nausea and vomiting.  Neurological: Negative for dizziness, light-headedness and weakness.   All other systems reviewed and are otherwise negative except as noted above.  Physical Exam    VS:  BP 120/80 (BP Location: Left Arm, Patient Position: Sitting, Cuff Size: Normal)   Pulse 80   Ht 5\' 11"  (1.803 m)   Wt 201 lb 2 oz (91.2 kg)   SpO2 98%   BMI 28.05 kg/m  , BMI Body mass index is 28.05 kg/m. GEN: Well nourished, well developed, in no acute distress. HEENT: normal. Neck: Supple, no JVD, carotid bruits, or masses. Cardiac: RRR, no murmurs, rubs, or gallops. No clubbing, cyanosis, edema.  Radials/PT 2+ and equal bilaterally.  Respiratory:  Respirations regular and unlabored, clear to auscultation bilaterally. GI: Soft, nontender, nondistended, BS + x 4. MS: No deformity or atrophy. Skin: Warm and dry, no rash. Neuro:  Strength and sensation are intact. Psych: Normal affect.  Assessment & Plan    1. Permanent atrial fibrillation - Declines anticoagulation. PPM functioning appropriately on EKG today. Rate well controlled.  2. NICM/HFrEF - Euvolemic and well compensated on exam. NYHA II with dyspnea with more than usual activity. 11/2018 EF 30-35%. Declined optimization of GDMT by additional medical therapies. Continue Lasix. BMET today for monitoring.  3. Dilated aorta - Mild to moderately dilated at 4.3cm on echo 11/2018. BP well controlled. Declines repeat echocardiogram. 4. Rectal bleeding - Reports difficulty with hemorrhoids. Encouraged to follow up with PCP. Not pale or anemic appearing on exam. CBC today 5. CHB s/p PPM - Follows with Dr. Caryl Comes of EP.  6. Tobacco use - Smoking cessation encouraged. Recommend utilization of 1800QUITNOW.  Disposition: Follow up in 3 months with  Dr. Caryl Comes and in 6 month(s) with Dr. [name] or APP   Loel Dubonnet, NP 05/21/2020, 4:53 PM

## 2020-05-22 LAB — BASIC METABOLIC PANEL
BUN/Creatinine Ratio: 9 — ABNORMAL LOW (ref 10–24)
BUN: 10 mg/dL (ref 8–27)
CO2: 23 mmol/L (ref 20–29)
Calcium: 9.6 mg/dL (ref 8.6–10.2)
Chloride: 103 mmol/L (ref 96–106)
Creatinine, Ser: 1.07 mg/dL (ref 0.76–1.27)
GFR calc Af Amer: 77 mL/min/{1.73_m2} (ref >59)
GFR calc non Af Amer: 67 mL/min/{1.73_m2} (ref >59)
Glucose: 101 mg/dL — ABNORMAL HIGH (ref 65–99)
Potassium: 4.5 mmol/L (ref 3.5–5.2)
Sodium: 140 mmol/L (ref 134–144)

## 2020-05-22 LAB — CBC
Hematocrit: 44.9 % (ref 37.5–51.0)
Hemoglobin: 15.8 g/dL (ref 13.0–17.7)
MCH: 32.6 pg (ref 26.6–33.0)
MCHC: 35.2 g/dL (ref 31.5–35.7)
MCV: 93 fL (ref 79–97)
Platelets: 149 10*3/uL — ABNORMAL LOW (ref 150–450)
RBC: 4.85 x10E6/uL (ref 4.14–5.80)
RDW: 12 % (ref 11.6–15.4)
WBC: 5.2 10*3/uL (ref 3.4–10.8)

## 2020-06-09 ENCOUNTER — Ambulatory Visit (INDEPENDENT_AMBULATORY_CARE_PROVIDER_SITE_OTHER): Payer: Medicare HMO | Admitting: General Practice

## 2020-06-09 ENCOUNTER — Telehealth: Payer: Self-pay | Admitting: General Practice

## 2020-06-09 DIAGNOSIS — I5022 Chronic systolic (congestive) heart failure: Secondary | ICD-10-CM

## 2020-06-09 DIAGNOSIS — I5032 Chronic diastolic (congestive) heart failure: Secondary | ICD-10-CM | POA: Diagnosis not present

## 2020-06-09 DIAGNOSIS — I4891 Unspecified atrial fibrillation: Secondary | ICD-10-CM

## 2020-06-09 DIAGNOSIS — E7800 Pure hypercholesterolemia, unspecified: Secondary | ICD-10-CM | POA: Diagnosis not present

## 2020-06-09 DIAGNOSIS — F331 Major depressive disorder, recurrent, moderate: Secondary | ICD-10-CM | POA: Diagnosis not present

## 2020-06-09 DIAGNOSIS — F411 Generalized anxiety disorder: Secondary | ICD-10-CM

## 2020-06-09 NOTE — Chronic Care Management (AMB) (Signed)
Chronic Care Management   Follow Up Note   06/09/2020 Name: Michael Delacruz MRN: [id number] DOB: 01-14-1941  Referred by: [name], DO Reason for referral : Chronic Care Management (Follow up: RNCM: Chronic Disease Management and Care Coordination Needs)   Michael Delacruz is a 79 y.o. year old male who is a primary care patient of [name], DO. The CCM team was consulted for assistance with chronic disease management and care coordination needs.    Review of patient status, including review of consultants reports, relevant laboratory and other test results, and collaboration with appropriate care team members and the patient's provider was performed as part of comprehensive patient evaluation and provision of chronic care management services.    SDOH (Social Determinants of Health) assessments performed: Yes See Care Plan activities for detailed interventions related to SDOH)  SDOH Interventions     Most Recent Value  SDOH Interventions  Food Insecurity Interventions Other (Comment)  [has been given food resources]  Financial Strain Interventions Other (Comment)  [the patient has been working with the careguides]  Physical Activity Interventions Other (Comments)  [no structured activity]  Depression Interventions/Treatment  --  [working with the LCSW]       Outpatient Encounter Medications as of 06/09/2020  Medication Sig  . ALPRAZolam (XANAX) 0.25 MG tablet TAKE 1 TABLET (0.25 MG TOTAL) BY MOUTH 2 (TWO) TIMES DAILY AS NEEDED FOR ANXIETY.  [name] ascorbic acid (VITAMIN C) 500 MG tablet Take 500 mg by mouth daily.  . calcium-vitamin D (OSCAL WITH D) 500-200 MG-UNIT tablet Take 1 tablet by mouth.  . furosemide (LASIX) 40 MG tablet TAKE 1 TABLET BY MOUTH TWICE A DAY AS NEEDED  . Homeopathic Products (HOMEOPATHIC CALM SL) Place under the tongue. Is using Doterra essential oil of Onguard ingestion daily  . Zinc Sulfate (ZINC 15 PO) Take by mouth.   No  facility-administered encounter medications on file as of 06/09/2020.     Objective:  BP Readings from Last 3 Encounters:  05/21/20 120/80  02/21/20 119/70  09/24/19 125/73    Goals Addressed              This Visit's Progress   .  RN: I have a lot of problems (pt-stated)        Current Barriers:  [name] Knowledge deficit related to basic heart failure pathophysiology and self care management . Knowledge Deficits related to heart failure medications . Financial strain . Cognitive Deficits-Patient reported memory issues  Case Manager Clinical Goal(s):  [name] Over the next 120 days, patient will weigh self daily and record . Over the next 120 days, patient will verbalize understanding of Heart Failure Action Plan and when to call doctor . Over the next 120 days, patient will weigh daily and record (notifying MD of 3 lb weight gain over night or 5 lb in a week) . Over the next 120 days the patient will verbalize an increase of strength and no reported falls  Interventions:  . Basic overview and discussion of pathophysiology of Heart Failure reviewed  . Provided verbal education on low sodium diet, the patient has limited food resources and has been eating a lot of peanut butter and jelly sandwiches.  06-09-2020: The patient does not watch his sodium content. The patient states he watches salt fall on his food. Education given on water following salt and the need to watch his sodium intake or use a salt substitute.   . Care guide referral to help with  food resources for patient to be able to follow prescribed dietary restrictions.  06-09-2020: The patient has spoke to the Care guides and has resources, often does not utilize information given.  . Advised patient to weigh each morning after emptying bladder . Discussed importance of daily weight and advised patient to weigh and record daily.  Review and patient is not weighing daily.  . (patient is weighing every other day- but not recording-  encouraged keeping a record-yesterday 199 . Reviewed role of diuretics in prevention of fluid overload and management of heart failure-the patient has had some water weight on board lately. 06-09-2020: The patient takes medications "most of the time" as directed. Denies swelling in his feet and legs.  . Advised patient, providing education and rationale, to weigh daily and record, calling PCP for weight gain of 3lbs overnight or 5 pounds in a week. Patient reports weighing daily and taking his lasix when he is up in his weight. He reports his weight is 199 yesterday . Discussed with patient the importance of exercise in increasing endurance and strength which patient stated he wanted to do. The patient states he is not doing a lot of exercise. "I feel so lazy and useless" "I feel like crap"- denies suicidal ideation  . Empathetic listening, patient very talkative about his health, his past life and recent losses he has had in his family, recent death of his "baby" sister. Also behind on rent and utilities.  Discussed his frustrations with different things in his life. Does not want to fill out forms. This frustrates him as well.  Will report to LCSW patient's expressed grief for further exploration and assistance with financial constraints as appropriate.  Patient Self Care Activities:  . Takes Heart Failure Medications as prescribed . Weighs daily and record (notifying MD of 3 lb weight gain over night or 5 lb in a week) . Verbalizes understanding of and follows CHF Action Plan . Adheres to low sodium diet  Please see past updates related to this goal by clicking on the "Past Updates" button in the selected goal      .  RN: I haven't been to the doctor in a while        Current Barriers:  . Chronic Disease Management support, education, and care coordination needs related to Atrial Fibrillation, CHF, HLD, Anxiety, and high fall risk  Clinical Goal(s) related to Atrial Fibrillation, CHF, HLD,  Anxiety, and high fall risk :  Over the next 120 days, patient will:  . Work with the care management team to address educational, disease management, and care coordination needs  . Begin or continue self health monitoring activities as directed today Measure and record blood pressure 7 times per week, Measure and record weight daily, and adhere to a heart Healthy diet . Call provider office for new or worsened signs and symptoms Blood pressure findings outside established parameters, Weight outside established parameters, Shortness of breath, and New or worsened symptom related to alterations in blood pressure, patient states it is going up and down . Call care management team with questions or concerns . Verbalize basic understanding of patient centered plan of care established today  Interventions related to Atrial Fibrillation, CHF, HLD, Anxiety, and High fall risk :  . Evaluation of current treatment plans and patient's adherence to plan as established by provider.  The patient has recently seen his cardiologist.  The patient is having a good day.   . Assessed patient understanding of disease states.  The patient has a good understanding of his conditions but is not always compliant with treatment plan established by his providers.  . Assessed patient's education and care coordination needs- sill limited income and resources, but does not wish to fill out paperwork- declined food resources at this time.   . Provided disease specific education to patient- review of available help and resources  . Education on Heart Healthy diet and watching sodium content.  The patient admits he does not always follow a heart healthy diet. Explained extra water on board can cause the heart to work harder. Education on EF% and what this actually means.  . Evaluation of patient depression and anxiety. The patient states he was having a really good day today. The patient denies any acute distress.  Nash Dimmer  with appropriate clinical care team members regarding patient needs- continued pharmacy support and LCSW support  Patient Self Care Activities related to Atrial Fibrillation, CHF, HLD, Anxiety, and High fall risk :  . Patient is unable to independently self-manage chronic health conditions  Please see past updates related to this goal by clicking on the "Past Updates" button in the selected goal          Plan:   The care management team will reach out to the patient again over the next 60 to 90 days.    Noreene Larsson RN, MSN, [place] Mobile: [phone]

## 2020-06-09 NOTE — Patient Instructions (Signed)
Visit Information  Goals Addressed              This Visit's Progress   .  RN: I have a lot of problems (pt-stated)        Current Barriers:  [name] Knowledge deficit related to basic heart failure pathophysiology and self care management . Knowledge Deficits related to heart failure medications . Financial strain . Cognitive Deficits-Patient reported memory issues  Case Manager Clinical Goal(s):  [name] Over the next 120 days, patient will weigh self daily and record . Over the next 120 days, patient will verbalize understanding of Heart Failure Action Plan and when to call doctor . Over the next 120 days, patient will weigh daily and record (notifying MD of 3 lb weight gain over night or 5 lb in a week) . Over the next 120 days the patient will verbalize an increase of strength and no reported falls  Interventions:  . Basic overview and discussion of pathophysiology of Heart Failure reviewed  . Provided verbal education on low sodium diet, the patient has limited food resources and has been eating a lot of peanut butter and jelly sandwiches.  06-09-2020: The patient does not watch his sodium content. The patient states he watches salt fall on his food. Education given on water following salt and the need to watch his sodium intake or use a salt substitute.   . Care guide referral to help with food resources for patient to be able to follow prescribed dietary restrictions.  06-09-2020: The patient has spoke to the Care guides and has resources, often does not utilize information given.  . Advised patient to weigh each morning after emptying bladder . Discussed importance of daily weight and advised patient to weigh and record daily.  Review and patient is not weighing daily.  . (patient is weighing every other day- but not recording- encouraged keeping a record-yesterday 199 . Reviewed role of diuretics in prevention of fluid overload and management of heart failure-the patient has had some water  weight on board lately. 06-09-2020: The patient takes medications "most of the time" as directed. Denies swelling in his feet and legs.  . Advised patient, providing education and rationale, to weigh daily and record, calling PCP for weight gain of 3lbs overnight or 5 pounds in a week. Patient reports weighing daily and taking his lasix when he is up in his weight. He reports his weight is 199 yesterday . Discussed with patient the importance of exercise in increasing endurance and strength which patient stated he wanted to do. The patient states he is not doing a lot of exercise. "I feel so lazy and useless" "I feel like crap"- denies suicidal ideation  . Empathetic listening, patient very talkative about his health, his past life and recent losses he has had in his family, recent death of his "baby" sister. Also behind on rent and utilities.  Discussed his frustrations with different things in his life. Does not want to fill out forms. This frustrates him as well.  Will report to LCSW patient's expressed grief for further exploration and assistance with financial constraints as appropriate.  Patient Self Care Activities:  . Takes Heart Failure Medications as prescribed . Weighs daily and record (notifying MD of 3 lb weight gain over night or 5 lb in a week) . Verbalizes understanding of and follows CHF Action Plan . Adheres to low sodium diet  Please see past updates related to this goal by clicking on the "Past Updates" button  in the selected goal      .  RN: I haven't been to the doctor in a while        Current Barriers:  . Chronic Disease Management support, education, and care coordination needs related to Atrial Fibrillation, CHF, HLD, Anxiety, and high fall risk  Clinical Goal(s) related to Atrial Fibrillation, CHF, HLD, Anxiety, and high fall risk :  Over the next 120 days, patient will:  . Work with the care management team to address educational, disease management, and care  coordination needs  . Begin or continue self health monitoring activities as directed today Measure and record blood pressure 7 times per week, Measure and record weight daily, and adhere to a heart Healthy diet . Call provider office for new or worsened signs and symptoms Blood pressure findings outside established parameters, Weight outside established parameters, Shortness of breath, and New or worsened symptom related to alterations in blood pressure, patient states it is going up and down . Call care management team with questions or concerns . Verbalize basic understanding of patient centered plan of care established today  Interventions related to Atrial Fibrillation, CHF, HLD, Anxiety, and High fall risk :  . Evaluation of current treatment plans and patient's adherence to plan as established by provider.  The patient has recently seen his cardiologist.  The patient is having a good day.   . Assessed patient understanding of disease states.  The patient has a good understanding of his conditions but is not always compliant with treatment plan established by his providers.  . Assessed patient's education and care coordination needs- sill limited income and resources, but does not wish to fill out paperwork- declined food resources at this time.   . Provided disease specific education to patient- review of available help and resources  . Education on Heart Healthy diet and watching sodium content.  The patient admits he does not always follow a heart healthy diet. Explained extra water on board can cause the heart to work harder. Education on EF% and what this actually means.  . Evaluation of patient depression and anxiety. The patient states he was having a really good day today. The patient denies any acute distress.  Nash Dimmer with appropriate clinical care team members regarding patient needs- continued pharmacy support and LCSW support  Patient Self Care Activities related to Atrial  Fibrillation, CHF, HLD, Anxiety, and High fall risk :  . Patient is unable to independently self-manage chronic health conditions  Please see past updates related to this goal by clicking on the "Past Updates" button in the selected goal         Patient verbalizes understanding of instructions provided today.   The care management team will reach out to the patient again over the next 60 to 90 days.   Noreene Larsson RN, MSN, [place] Mobile: [phone]

## 2020-07-29 ENCOUNTER — Ambulatory Visit (INDEPENDENT_AMBULATORY_CARE_PROVIDER_SITE_OTHER): Payer: Medicare HMO | Admitting: *Deleted

## 2020-07-29 DIAGNOSIS — I442 Atrioventricular block, complete: Secondary | ICD-10-CM

## 2020-07-29 LAB — CUP PACEART REMOTE DEVICE CHECK
Battery Remaining Longevity: 96 mo
Battery Remaining Percentage: 95.5 %
Battery Voltage: 2.99 V
Date Time Interrogation Session: 20210810061511
Implantable Lead Implant Date: 20190515
Implantable Lead Implant Date: 20190515
Implantable Lead Location: 753858
Implantable Lead Location: 753860
Implantable Lead Model: 5076
Implantable Pulse Generator Implant Date: 20190515
Lead Channel Impedance Value: 1100 Ohm
Lead Channel Impedance Value: 550 Ohm
Lead Channel Pacing Threshold Amplitude: 0.5 V
Lead Channel Pacing Threshold Amplitude: 1 V
Lead Channel Pacing Threshold Pulse Width: 0.5 ms
Lead Channel Pacing Threshold Pulse Width: 0.5 ms
Lead Channel Sensing Intrinsic Amplitude: 12 mV
Lead Channel Setting Pacing Amplitude: 2.5 V
Lead Channel Setting Pacing Amplitude: 2.5 V
Lead Channel Setting Pacing Pulse Width: 0.5 ms
Lead Channel Setting Pacing Pulse Width: 0.5 ms
Lead Channel Setting Sensing Sensitivity: 2 mV
Pulse Gen Model: 3562
Pulse Gen Serial Number: 9427031

## 2020-07-30 NOTE — Progress Notes (Signed)
Remote pacemaker transmission.   

## 2020-08-05 ENCOUNTER — Encounter: Payer: Self-pay | Admitting: Internal Medicine

## 2020-08-05 ENCOUNTER — Other Ambulatory Visit: Payer: Self-pay

## 2020-08-05 ENCOUNTER — Ambulatory Visit (INDEPENDENT_AMBULATORY_CARE_PROVIDER_SITE_OTHER): Payer: Medicare HMO | Admitting: Internal Medicine

## 2020-08-05 VITALS — BP 124/74 | HR 83 | Ht 70.0 in | Wt 199.0 lb

## 2020-08-05 DIAGNOSIS — I5020 Unspecified systolic (congestive) heart failure: Secondary | ICD-10-CM

## 2020-08-05 DIAGNOSIS — I442 Atrioventricular block, complete: Secondary | ICD-10-CM

## 2020-08-05 DIAGNOSIS — Z950 Presence of cardiac pacemaker: Secondary | ICD-10-CM | POA: Diagnosis not present

## 2020-08-05 DIAGNOSIS — I4819 Other persistent atrial fibrillation: Secondary | ICD-10-CM

## 2020-08-05 DIAGNOSIS — I428 Other cardiomyopathies: Secondary | ICD-10-CM

## 2020-08-05 MED ORDER — FUROSEMIDE 40 MG PO TABS: 40.0000 mg | ORAL_TABLET | Freq: Two times a day (BID) | ORAL | 0 refills | Status: DC | PRN

## 2020-08-05 NOTE — Progress Notes (Signed)
Patient Care Team: Michael Hauser, DO as PCP - General (Family Medicine) Michael Situ Kathlene November, MD as PCP - Cardiology (Cardiology) Michael Merritts, MD as Consulting Physician (Cardiology) Michael Cutter, LCSW as Social Worker (Licensed Clinical Social Worker) Michael Ingles, RN as Case Manager ([place]) Michael Delacruz, Virl Delacruz, [facility] as Pharmacist   HPI  Michael Delacruz is a 79 y.o. male seen in follow-up for persistent/permanent atrial fibrillation/flutter with a rapid ventricular response and interval deterioration of LV systolic function   DATE TEST EF   8/16    Echo  55-60 % Severe MR 2/2 post leaflet prolapse  12/16    Echo  55-60 % Mild Aortic root dilitation MR mild  8/18 Echo  45-50%   5/19 Echo  20-25%   8/19 Echo  25-30%           He has been disinclined because of his eyes to undergo anticoagulation and cardioversion even for a short period of time .Hence, we elected to use amiodarone as a class IIb indication for rate control as a potential alternative to AV junction ablation and pacing   intercurrently  amio has been discontinued because of nightmares.    5/19 he presented with acute systolic heart failure with symptoms of impending doom.  He was in atrial flutter with uncontrolled rate.  He was at that juncture willing and underwent CRT-P and AV junction ablation.  I did his CRT-P Dr. Greggory Delacruz did his AV ablation  Chronic sob, no edema   Headaches new and worsening and has a growing lesion in his nose x 1-2 yrs  Continues to complain of leg weakness  DATE TEST   9/18 Holter      Mean 123 (94-135)  12/18 Holter Mean HR 112 (94-135)          Date Cr K TSH ALT   10/18 1.23 3.4 5.31(8/18)  12  10/18 1..44 3.9 9.9>>8.7   5/19 0.97 3.9           Attempted at last visit to use Entresto.  Failed because of orthostasis.     Past Medical History:  Diagnosis Date   Arthritis    Asthma    Bradycardia    CAD in native artery    a.  LHC 09/2015: 40% pCx, 35% mRCA.   COPD (chronic obstructive pulmonary disease) (HCC)    Dilation of intestine 01/2015   Fibromyalgia    Frequent headaches    GERD (gastroesophageal reflux disease)    History of blood clots    eye    History of hiatal hernia    History of rheumatic fever    Hypertension    Kidney stone    PAF (paroxysmal atrial fibrillation) ([place])    a. s/p TEE/DCCV 07/2015. b. H/o bleeding on Coumadin when INR >5, changed to Eliquis.   S/P Minimally invasive maze operation for atrial fibrillation 10/22/2015   Complete bilateral atrial lesion set using cryothermy and bipolar radiofrequency ablation with clipping of LA appendage via right mini thoracotomy approach   S/P minimally invasive mitral valve repair 10/22/2015   Complex valvuloplasty including triangular resection of posterior leaflet, artificial Gore-tex neochord placement x6 and 38 mm Sorin Memo 3D Rechord ring annuloplasty via right minithoracotomy approach   Severe mitral regurgitation    a. s/p MV repair 10/2015.   Sleep apnea    Stroke [facility])    Thyroid disorder    TIA (transient ischemic attack)  Tobacco abuse     Past Surgical History:  Procedure Laterality Date   ANKLE SURGERY     AV NODE ABLATION N/A 05/04/2018   Procedure: AV NODE ABLATION;  Surgeon: Michael Grayer, MD;  Location: [place] CV LAB;  Service: Cardiovascular;  Laterality: N/A;   BIV PACEMAKER INSERTION CRT-P N/A 05/03/2018   Procedure: BIV PACEMAKER INSERTION CRT-P;  Surgeon: Michael Sprang, MD;  Location: [place] CV LAB;  Service: Cardiovascular;  Laterality: N/A;   CARDIAC CATHETERIZATION N/A 10/03/2015   Procedure: Right and Left Heart Cath and Coronary Angiography;  Surgeon: Michael Merritts, MD;  Location: [place] CV LAB;  Service: Cardiovascular;  Laterality: N/A;   COLONOSCOPY     ELECTROPHYSIOLOGIC STUDY N/A 08/18/2015   Procedure: CARDIOVERSION;  Surgeon: Michael Hampshire, MD;  Location:  [facility] ORS;  Service: Cardiovascular;  Laterality: N/A;   MINIMALLY INVASIVE MAZE PROCEDURE N/A 10/22/2015   Procedure: MINIMALLY INVASIVE MAZE PROCEDURE;  Surgeon: Michael Alberts, MD;  Location: [place];  Service: Open Heart Surgery;  Laterality: N/A;   MITRAL VALVE REPAIR Right 10/22/2015   Procedure: MINIMALLY INVASIVE MITRAL VALVE REPAIR (MVR);  Surgeon: Michael Alberts, MD;  Location: [place];  Service: Open Heart Surgery;  Laterality: Right;   SINUS EXPLORATION     TEE WITHOUT CARDIOVERSION N/A 08/18/2015   Procedure: TRANSESOPHAGEAL ECHOCARDIOGRAM (TEE);  Surgeon: Michael Hampshire, MD;  Location: [facility] ORS;  Service: Cardiovascular;  Laterality: N/A;   TEE WITHOUT CARDIOVERSION N/A 10/22/2015   Procedure: TRANSESOPHAGEAL ECHOCARDIOGRAM (TEE);  Surgeon: Michael Alberts, MD;  Location: [place];  Service: Open Heart Surgery;  Laterality: N/A;    Current Outpatient Medications  Medication Sig Dispense Refill   ALPRAZolam (XANAX) 0.25 MG tablet TAKE 1 TABLET (0.25 MG TOTAL) BY MOUTH 2 (TWO) TIMES DAILY AS NEEDED FOR ANXIETY. 60 tablet 2   ascorbic acid (VITAMIN C) 500 MG tablet Take 500 mg by mouth daily.     calcium-vitamin D (OSCAL WITH D) 500-200 MG-UNIT tablet Take 1 tablet by mouth.     furosemide (LASIX) 40 MG tablet TAKE 1 TABLET BY MOUTH TWICE A DAY AS NEEDED 180 tablet 0   Homeopathic Products (HOMEOPATHIC CALM SL) Place under the tongue. Is using Doterra essential oil of Onguard ingestion daily     Zinc Sulfate (ZINC 15 PO) Take by mouth.     No current facility-administered medications for this visit.    Allergies  Allergen Reactions   Codeine Nausea Only   Digoxin And Related     SOB, bad dreams   Macrodantin [Nitrofurantoin Macrocrystal] Rash   Morphine And Related Rash      Review of Systems negative except from HPI and PMH  Physical Exam BP 124/74 (BP Location: Left Arm, Patient Position: Sitting, Cuff Size: Normal)    Pulse 83    Ht 5\' 10"  (1.778 m)    Wt  199 lb (90.3 kg)    SpO2 97%    BMI 28.55 kg/m   Well developed and nourished in no acute distress HENT ulcer L nares Neck supple   Clear but decreased Regular rate and rhythm, no murmurs or gallops Abd-soft with active BS No Clubbing cyanosis edema Skin-warm and dry A & Oriented  Grossly normal sensory and motor function lower extremities testing specifically some hip flexor weakness ? 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

## 2020-08-05 NOTE — Patient Instructions (Signed)

## 2020-08-11 DIAGNOSIS — I25118 Atherosclerotic heart disease of native coronary artery with other forms of angina pectoris: Secondary | ICD-10-CM | POA: Diagnosis not present

## 2020-08-11 DIAGNOSIS — I739 Peripheral vascular disease, unspecified: Secondary | ICD-10-CM | POA: Diagnosis not present

## 2020-08-11 DIAGNOSIS — I495 Sick sinus syndrome: Secondary | ICD-10-CM | POA: Diagnosis not present

## 2020-08-11 DIAGNOSIS — I480 Paroxysmal atrial fibrillation: Secondary | ICD-10-CM | POA: Diagnosis not present

## 2020-08-11 DIAGNOSIS — J449 Chronic obstructive pulmonary disease, unspecified: Secondary | ICD-10-CM | POA: Diagnosis not present

## 2020-08-11 DIAGNOSIS — E261 Secondary hyperaldosteronism: Secondary | ICD-10-CM | POA: Diagnosis not present

## 2020-08-11 DIAGNOSIS — I509 Heart failure, unspecified: Secondary | ICD-10-CM | POA: Diagnosis not present

## 2020-08-11 DIAGNOSIS — F17200 Nicotine dependence, unspecified, uncomplicated: Secondary | ICD-10-CM | POA: Diagnosis not present

## 2020-08-11 DIAGNOSIS — H5461 Unqualified visual loss, right eye, normal vision left eye: Secondary | ICD-10-CM | POA: Diagnosis not present

## 2020-08-13 ENCOUNTER — Other Ambulatory Visit: Payer: Self-pay | Admitting: Family Medicine

## 2020-08-13 DIAGNOSIS — G4701 Insomnia due to medical condition: Secondary | ICD-10-CM

## 2020-08-13 NOTE — Telephone Encounter (Signed)
Requested medication (s) are due for refill today: yes  Requested medication (s) are on the active medication list: yes  Last refill:  02/20/20 # 60 with 2 refills  Future visit scheduled: yes  Notes to clinic:  Please review for refill. Refill not delegated per protocol    Requested Prescriptions  Pending Prescriptions Disp Refills   ALPRAZolam (XANAX) 0.25 MG tablet [Pharmacy Med Name: ALPRAZOLAM 0.25 MG TABLET] 60 tablet 2    Sig: TAKE 1 TABLET (0.25 MG TOTAL) BY MOUTH 2 (TWO) TIMES DAILY AS NEEDED FOR ANXIETY.      Not Delegated - Psychiatry:  Anxiolytics/Hypnotics Failed - 08/13/2020  1:56 PM      Failed - This refill cannot be delegated      Failed - Urine Drug Screen completed in last 360 days.      Passed - Valid encounter within last 6 months    Recent Outpatient Visits           5 months ago Generalized anxiety disorder   [place], DO   10 months ago Generalized anxiety disorder   [place], DO   1 year ago Recurrent falls   [facility] [name], DO   1 year ago Centrilobular emphysema [facility])   [facility] [name], DO   2 years ago Centrilobular emphysema [facility])   [place], DO       Future Appointments             In 3 months Gollan, Kathlene November, MD [facility], [place]

## 2020-08-20 LAB — CUP PACEART INCLINIC DEVICE CHECK
Date Time Interrogation Session: 20210817170837
Implantable Lead Implant Date: 20190515
Implantable Lead Implant Date: 20190515
Implantable Lead Location: 753858
Implantable Lead Location: 753860
Implantable Lead Model: 5076
Implantable Pulse Generator Implant Date: 20190515
Lead Channel Impedance Value: 590 Ohm
Lead Channel Impedance Value: 660 Ohm
Lead Channel Pacing Threshold Amplitude: 0.5 V
Lead Channel Pacing Threshold Amplitude: 0.75 V
Lead Channel Pacing Threshold Pulse Width: 0.5 ms
Lead Channel Pacing Threshold Pulse Width: 0.5 ms
Lead Channel Setting Pacing Amplitude: 2 V
Lead Channel Setting Pacing Amplitude: 2.5 V
Lead Channel Setting Pacing Pulse Width: 0.5 ms
Lead Channel Setting Pacing Pulse Width: 0.5 ms
Lead Channel Setting Sensing Sensitivity: 2 mV
Pulse Gen Model: 3562
Pulse Gen Serial Number: 9427031

## 2020-08-28 ENCOUNTER — Telehealth: Payer: Self-pay | Admitting: General Practice

## 2020-08-28 ENCOUNTER — Telehealth: Payer: Self-pay

## 2020-08-28 NOTE — Telephone Encounter (Signed)
  Chronic Care Management   Outreach Note  08/28/2020 Name: Michael Delacruz MRN: [id number] DOB: 1941-09-26  Referred by: [name], DO Reason for referral : Chronic Care Management (RNCM Follow up calll for Chronic Disease Management and Care Coordination Needs)   An unsuccessful telephone outreach was attempted today. The patient was referred to the case management team for assistance with care management and care coordination.   Follow Up Plan: A HIPPA compliant phone message was left for the patient providing contact information and requesting a return call.   Noreene Larsson RN, MSN, [place] Mobile: [phone]

## 2020-09-04 ENCOUNTER — Telehealth: Payer: Self-pay

## 2020-09-04 NOTE — Telephone Encounter (Signed)
Pt has been r/s 10/13/2020

## 2020-09-04 NOTE — Chronic Care Management (AMB) (Signed)
  Care Management   Note  09/04/2020 Name: Michael Delacruz MRN: [id number] DOB: 10/25/1941  Michael Delacruz is a 79 y.o. year old male who is a primary care patient of [name], DO and is actively engaged with the care management team. I reached out to Colin Benton by phone today to assist with re-scheduling a follow up visit with the RN Case Manager  Follow up plan: Telephone appointment with care management team member scheduled for:10/13/2020  Noreene Larsson, [place], [place], [place] [address] Direct Dial: [phone] [email or web address]

## 2020-09-04 NOTE — Chronic Care Management (AMB) (Signed)
  Care Management   Note  09/04/2020 Name: ALGIE WESTRY MRN: [id number] DOB: October 08, 1941  LAYNE DILAURO is a 79 y.o. year old male who is a primary care patient of [name], DO and is actively engaged with the care management team. I reached out to Colin Benton by phone today to assist with re-scheduling a follow up visit with the RN Case Manager  Follow up plan: Unsuccessful telephone outreach attempt made. A HIPAA compliant phone message was left for the patient providing contact information and requesting a return call.  The care management team will reach out to the patient again over the next 7 days.  If patient returns call to provider office, please advise to call [place]  at [phone].  Noreene Larsson, [place], [place], [place] [address] Direct Dial: [phone] [email or web address]: [email or web address]

## 2020-10-10 ENCOUNTER — Telehealth: Payer: Self-pay | Admitting: *Deleted

## 2020-10-10 NOTE — Telephone Encounter (Signed)
Attempted to contact and schedule lung screening scan. Message left for patient to call back to schedule. 

## 2020-10-13 ENCOUNTER — Telehealth: Payer: Medicare HMO

## 2020-10-13 ENCOUNTER — Telehealth: Payer: Self-pay | Admitting: General Practice

## 2020-10-13 NOTE — Telephone Encounter (Signed)
  Chronic Care Management   Outreach Note  10/13/2020 Name: Michael Delacruz MRN: [id number] DOB: [birth year]  Referred by: [name], DO Reason for referral : Chronic Care Management (RNCM: Chronic Disease Management and Care Coordination Needs- 2nd attempt for follow up)   A second unsuccessful telephone outreach was attempted today. The patient was referred to the case management team for assistance with care management and care coordination.   Follow Up Plan: A HIPAA compliant phone message was left for the patient providing contact information and requesting a return call.   Noreene Larsson RN, MSN, [place] Mobile: [phone]

## 2020-10-21 ENCOUNTER — Telehealth: Payer: Self-pay | Admitting: *Deleted

## 2020-10-21 DIAGNOSIS — Z122 Encounter for screening for malignant neoplasm of respiratory organs: Secondary | ICD-10-CM

## 2020-10-21 DIAGNOSIS — Z87891 Personal history of nicotine dependence: Secondary | ICD-10-CM

## 2020-10-21 NOTE — Telephone Encounter (Signed)
Contacted and scheduled. Current smoker, 30.25 pack year history

## 2020-10-27 ENCOUNTER — Ambulatory Visit
Admission: RE | Admit: 2020-10-27 | Discharge: 2020-10-27 | Disposition: A | Discharge status: Home / Self Care | Payer: Medicare HMO | Source: Ambulatory Visit | Attending: Nurse Practitioner | Admitting: Nurse Practitioner

## 2020-10-27 ENCOUNTER — Other Ambulatory Visit: Payer: Self-pay

## 2020-10-27 ENCOUNTER — Telehealth: Payer: Self-pay | Admitting: *Deleted

## 2020-10-27 ENCOUNTER — Encounter: Payer: Self-pay | Admitting: *Deleted

## 2020-10-27 DIAGNOSIS — F17210 Nicotine dependence, cigarettes, uncomplicated: Secondary | ICD-10-CM | POA: Diagnosis not present

## 2020-10-27 DIAGNOSIS — Z87891 Personal history of nicotine dependence: Secondary | ICD-10-CM | POA: Insufficient documentation

## 2020-10-27 DIAGNOSIS — Z122 Encounter for screening for malignant neoplasm of respiratory organs: Secondary | ICD-10-CM | POA: Insufficient documentation

## 2020-10-27 NOTE — Telephone Encounter (Signed)
Notified patient of LDCT lung cancer screening program results with recommendation for 6 month follow up imaging. Also notified of incidental findings noted below and is encouraged to discuss further with PCP who will receive a copy of this note and/or the CT report. Patient verbalizes understanding.   IMPRESSION: 1. New 5.1 mm inferior lingular nodule. Lung-RADS 3, probably benign findings. Short-term follow-up in 6 months is recommended with repeat low-dose chest CT without contrast (please use the following order, "CT CHEST LCS NODULE FOLLOW-UP W/O CM"). These results will be called to the ordering clinician or representative by the Radiologist Assistant, and communication documented in the PACS or [employer]. 2. Ascending Aortic aneurysm NOS (ICD10-I71.9), stable. Recommend annual imaging followup by CTA or MRA. This recommendation follows 2010 ACCF/AHA/AATS/ACR/ASA/SCA/SCAI/SIR/STS/SVM Guidelines for the Diagnosis and Management of Patients with Thoracic Aortic Disease. Circulation. 2010; 121: I103-U131. Aortic aneurysm NOS (ICD10-I71.9). 3. Tiny left renal stone. 4. Aortic atherosclerosis (ICD10-I70.0). Coronary artery calcification. 5.  Emphysema (ICD10-J43.9).

## 2020-10-28 ENCOUNTER — Ambulatory Visit (INDEPENDENT_AMBULATORY_CARE_PROVIDER_SITE_OTHER): Payer: Medicare HMO

## 2020-10-28 DIAGNOSIS — I4821 Permanent atrial fibrillation: Secondary | ICD-10-CM

## 2020-10-28 LAB — CUP PACEART REMOTE DEVICE CHECK
Battery Remaining Longevity: 92 mo
Battery Remaining Percentage: 95.5 %
Battery Voltage: 2.99 V
Date Time Interrogation Session: 20211109095052
Implantable Lead Implant Date: 20190515
Implantable Lead Implant Date: 20190515
Implantable Lead Location: 753858
Implantable Lead Location: 753860
Implantable Lead Model: 5076
Implantable Pulse Generator Implant Date: 20190515
Lead Channel Impedance Value: 580 Ohm
Lead Channel Impedance Value: 580 Ohm
Lead Channel Pacing Threshold Amplitude: 0.5 V
Lead Channel Pacing Threshold Amplitude: 0.75 V
Lead Channel Pacing Threshold Pulse Width: 0.5 ms
Lead Channel Pacing Threshold Pulse Width: 0.5 ms
Lead Channel Sensing Intrinsic Amplitude: 7.2 mV
Lead Channel Setting Pacing Amplitude: 2 V
Lead Channel Setting Pacing Amplitude: 2.5 V
Lead Channel Setting Pacing Pulse Width: 0.5 ms
Lead Channel Setting Pacing Pulse Width: 0.5 ms
Lead Channel Setting Sensing Sensitivity: 2 mV
Pulse Gen Model: 3562
Pulse Gen Serial Number: 9427031

## 2020-10-29 NOTE — Progress Notes (Signed)
Remote pacemaker transmission.   

## 2020-11-04 ENCOUNTER — Telehealth: Payer: Self-pay

## 2020-11-04 ENCOUNTER — Other Ambulatory Visit: Payer: Self-pay | Admitting: Internal Medicine

## 2020-11-04 NOTE — Chronic Care Management (AMB) (Signed)
  Care Management   Note  11/04/2020 Name: Michael Delacruz MRN: [id number] DOB: 27-Apr-1941  Michael Delacruz is a 79 y.o. year old male who is a primary care patient of [name], DO and is actively engaged with the care management team. I reached out to Michael Delacruz by phone today to assist with re-scheduling a follow up visit with the RN Case Manager  Follow up plan: Unsuccessful telephone outreach attempt made. A HIPAA compliant phone message was left for the patient providing contact information and requesting a return call.  The care management team will reach out to the patient again over the next 7 days.  If patient returns call to provider office, please advise to call Michael Delacruz  at [place], [place], [place], [place] [address] Direct Dial: [phone] [email or web address] Website: [email or web address]

## 2020-11-04 NOTE — Chronic Care Management (AMB) (Signed)
  Care Management   Note  11/04/2020 Name: AQUARIUS LATOUCHE MRN: [id number] DOB: 31-Dec-1940  KAYD LAUNER is a 79 y.o. year old male who is a primary care patient of [name], DO and is actively engaged with the care management team. I reached out to Colin Benton by phone today to assist with re-scheduling a follow up visit with the RN Case Manager  Follow up plan: Telephone appointment with care management team member scheduled for:11/20/2020  Noreene Larsson, [place], [place] [facility]  [place], [place] [address] Direct Dial: [phone] [email or web address]: [email or web address]

## 2020-11-04 NOTE — Telephone Encounter (Signed)
This is a [place] pt 

## 2020-11-20 ENCOUNTER — Ambulatory Visit: Payer: Self-pay | Admitting: General Practice

## 2020-11-20 ENCOUNTER — Telehealth: Payer: Medicare HMO | Admitting: General Practice

## 2020-11-20 DIAGNOSIS — I4891 Unspecified atrial fibrillation: Secondary | ICD-10-CM

## 2020-11-20 DIAGNOSIS — F331 Major depressive disorder, recurrent, moderate: Secondary | ICD-10-CM

## 2020-11-20 DIAGNOSIS — I4821 Permanent atrial fibrillation: Secondary | ICD-10-CM

## 2020-11-20 DIAGNOSIS — I5032 Chronic diastolic (congestive) heart failure: Secondary | ICD-10-CM

## 2020-11-20 DIAGNOSIS — E7800 Pure hypercholesterolemia, unspecified: Secondary | ICD-10-CM

## 2020-11-20 DIAGNOSIS — J432 Centrilobular emphysema: Secondary | ICD-10-CM

## 2020-11-20 DIAGNOSIS — F411 Generalized anxiety disorder: Secondary | ICD-10-CM

## 2020-11-20 NOTE — Patient Instructions (Signed)
Visit Information  Goals Addressed              This Visit's Progress   .  RN: I have a lot of problems (pt-stated)        Current Barriers:  [name] Knowledge deficit related to basic heart failure pathophysiology and self care management . Knowledge Deficits related to heart failure medications . Financial strain . Cognitive Deficits-Patient reported memory issues  Case Manager Clinical Goal(s):  [name] Over the next 120 days, patient will weigh self daily and record . Over the next 120 days, patient will verbalize understanding of Heart Failure Action Plan and when to call doctor . Over the next 120 days, patient will weigh daily and record (notifying MD of 3 lb weight gain over night or 5 lb in a week) . Over the next 120 days the patient will verbalize an increase of strength and no reported falls  Interventions:  . Basic overview and discussion of pathophysiology of Heart Failure reviewed  . Provided verbal education on low sodium diet, the patient has limited food resources and has been eating a lot of peanut butter and jelly sandwiches. 11-25-2020: The patient does not watch his sodium content. The patient states he watches salt fall on his food. Education given on water following salt and the need to watch his sodium intake or use a salt substitute. Review today of watching hidden sodium and being mindful of sodium.   . Care guide referral to help with food resources for patient to be able to follow prescribed dietary restrictions.  06-09-2020: The patient has spoke to the Care guides and has resources, often does not utilize information given. 11-25-2020: The patient denies any needs at this time. Will continue to monitor.   . Advised patient to weigh each morning after emptying bladder . Discussed importance of daily weight and advised patient to weigh and record daily.  Review and patient is not weighing daily.  . (patient is weighing every other day- but not recording- encouraged keeping  a record-yesterday 199 . Reviewed role of diuretics in prevention of fluid overload and management of heart failure-the patient has had some water weight on board lately. 11/25/2020: The patient takes medications "most of the time" as directed. Denies swelling in his feet and legs. The patient states he is compliant with his medications. . Advised patient, providing education and rationale, to weigh daily and record, calling PCP for weight gain of 3lbs overnight or 5 pounds in a week. Patient reports weighing daily and taking his lasix when he is up in his weight. He reports his weight is 199 yesterday . Discussed with patient the importance of exercise in increasing endurance and strength which patient stated he wanted to do. The patient states he is not doing a lot of exercise. "I feel so lazy and useless" "I feel like crap"- denies suicidal ideation  . Empathetic listening, patient very talkative about his health, his past life and recent losses he has had in his family, recent death of his "baby" sister. 2020/11/25: The patient is doing better with his depression and anxiety. The patient has a new granddaughter and was excited to tell the RNCM about his new granddaughter.  Patient Self Care Activities:  . Takes Heart Failure Medications as prescribed . Weighs daily and record (notifying MD of 3 lb weight gain over night or 5 lb in a week) . Verbalizes understanding of and follows CHF Action Plan . Adheres to low sodium diet  Please  see past updates related to this goal by clicking on the "Past Updates" button in the selected goal      .  RN: I haven't been to the doctor in a while        Current Barriers:  . Chronic Disease Management support, education, and care coordination needs related to Atrial Fibrillation, CHF, HLD, Anxiety, and high fall risk  Clinical Goal(s) related to Atrial Fibrillation, CHF, HLD, Anxiety, and high fall risk :  Over the next 120 days, patient will:  . Work with  the care management team to address educational, disease management, and care coordination needs  . Begin or continue self health monitoring activities as directed today Measure and record blood pressure 7 times per week, Measure and record weight daily, and adhere to a heart Healthy diet . Call provider office for new or worsened signs and symptoms Blood pressure findings outside established parameters, Weight outside established parameters, Shortness of breath, and New or worsened symptom related to alterations in blood pressure, patient states it is going up and down . Call care management team with questions or concerns . Verbalize basic understanding of patient centered plan of care established today  Interventions related to Atrial Fibrillation, CHF, HLD, Anxiety, and High fall risk :  . Evaluation of current treatment plans and patient's adherence to plan as established by provider.  The patient has recently seen his cardiologist.  The patient is having a good day.  11-20-2020: The patient is doing well. The patient was happy and was upbeat and positive . States he is doing well. He is happy about his baby granddaughter finally being home.   . Assessed patient understanding of disease states.  The patient has a good understanding of his conditions but is not always compliant with treatment plan established by his providers.  . Assessed patient's education and care coordination needs- sill limited income and resources, but does not wish to fill out paperwork- declined food resources at this time.  Denies any care coordination needs at this time.  . Provided disease specific education to patient- review of available help and resources  . Education on Heart Healthy diet and watching sodium content.  The patient admits he does not always follow a heart healthy diet. Explained extra water on board can cause the heart to work harder. Education on EF% and what this actually means.  . Evaluation of  patient depression and anxiety. The patient states he was having a really good day today. The patient denies any acute distress.  Nash Dimmer with appropriate clinical care team members regarding patient needs- continued pharmacy support and LCSW support . Evaluation of upcoming appointments: The patient states that he does not have any to see pcp but knows to call for changes. The patient also states that he sees his cardiologist regularly. He will get the place on his nose checked out as it is likely some kind of cancer. Reminded the patient the importance of health and well being, especially since he has his new granddaughter there living with him now.   Patient Self Care Activities related to Atrial Fibrillation, CHF, HLD, Anxiety, and High fall risk :  . Patient is unable to independently self-manage chronic health conditions  Please see past updates related to this goal by clicking on the "Past Updates" button in the selected goal         The patient verbalized understanding of instructions, educational materials, and care plan provided today and declined offer to receive  copy of patient instructions, educational materials, and care plan.   Telephone follow up appointment with care management team member scheduled for: 01-15-2021 at 2:15 pm  [place], MSN, [place] Mobile: [phone]

## 2020-11-20 NOTE — Chronic Care Management (AMB) (Signed)
Chronic Care Management   Follow Up Note   11/20/2020 Name: Michael Delacruz MRN: [id number] DOB: 07/25/1941  Referred by: Michael Hauser, DO Reason for referral : Chronic Care Management (RNCM follow up for Chronic Disease Management and Care Coordination Needs )   Michael Delacruz is a 79 y.o. year old male who is a primary care patient of Michael Hauser, DO. The CCM team was consulted for assistance with chronic disease management and care coordination needs.    Review of patient status, including review of consultants reports, relevant laboratory and other test results, and collaboration with appropriate care team members and the patient's provider was performed as part of comprehensive patient evaluation and provision of chronic care management services.    SDOH (Social Determinants of Health) assessments performed: Yes See Care Plan activities for detailed interventions related to [facility])     Outpatient Encounter Medications as of 11/20/2020  Medication Sig   ALPRAZolam (XANAX) 0.25 MG tablet TAKE 1 TABLET (0.25 MG TOTAL) BY MOUTH 2 (TWO) TIMES DAILY AS NEEDED FOR ANXIETY.   ascorbic acid (VITAMIN C) 500 MG tablet Take 500 mg by mouth daily.   calcium-vitamin D (OSCAL WITH D) 500-200 MG-UNIT tablet Take 1 tablet by mouth.   furosemide (LASIX) 40 MG tablet TAKE 1 TABLET BY MOUTH TWICE A DAY AS NEEDED   Homeopathic Products (HOMEOPATHIC CALM SL) Place under the tongue. Is using Doterra essential oil of Onguard ingestion daily   Zinc Sulfate (ZINC 15 PO) Take by mouth.   No facility-administered encounter medications on file as of 11/20/2020.     Objective:   Goals Addressed              This Visit's Progress     RN: I have a lot of problems (pt-stated)        Current Barriers:   Knowledge deficit related to basic heart failure pathophysiology and self care management  Knowledge Deficits related to heart failure medications  Financial  strain  Cognitive Deficits-Patient reported memory issues  Case Manager Clinical Goal(s):   Over the next 120 days, patient will weigh self daily and record  Over the next 120 days, patient will verbalize understanding of Heart Failure Action Plan and when to call doctor  Over the next 120 days, patient will weigh daily and record (notifying MD of 3 lb weight gain over night or 5 lb in a week)  Over the next 120 days the patient will verbalize an increase of strength and no reported falls  Interventions:   Basic overview and discussion of pathophysiology of Heart Failure reviewed   Provided verbal education on low sodium diet, the patient has limited food resources and has been eating a lot of peanut butter and jelly sandwiches. 11-20-2020: The patient does not watch his sodium content. The patient states he watches salt fall on his food. Education given on water following salt and the need to watch his sodium intake or use a salt substitute. Review today of watching hidden sodium and being mindful of sodium.    Care guide referral to help with food resources for patient to be able to follow prescribed dietary restrictions.  06-09-2020: The patient has spoke to the Care guides and has resources, often does not utilize information given. 11-20-2020: The patient denies any needs at this time. Will continue to monitor.    Advised patient to weigh each morning after emptying bladder  Discussed importance of daily weight and advised patient to weigh  and record daily.  Review and patient is not weighing daily.   (patient is weighing every other day- but not recording- encouraged keeping a record-yesterday 199  Reviewed role of diuretics in prevention of fluid overload and management of heart failure-the patient has had some water weight on board lately. November 29, 2020: The patient takes medications "most of the time" as directed. Denies swelling in his feet and legs. The patient states he is  compliant with his medications.  Advised patient, providing education and rationale, to weigh daily and record, calling PCP for weight gain of 3lbs overnight or 5 pounds in a week. Patient reports weighing daily and taking his lasix when he is up in his weight. He reports his weight is 199 yesterday  Discussed with patient the importance of exercise in increasing endurance and strength which patient stated he wanted to do. The patient states he is not doing a lot of exercise. "I feel so lazy and useless" "I feel like crap"- denies suicidal ideation   Empathetic listening, patient very talkative about his health, his past life and recent losses he has had in his family, recent death of his "baby" sister. 11/29/2020: The patient is doing better with his depression and anxiety. The patient has a new granddaughter and was excited to tell the RNCM about his new granddaughter.  Patient Self Care Activities:   Takes Heart Failure Medications as prescribed  Weighs daily and record (notifying MD of 3 lb weight gain over night or 5 lb in a week)  Verbalizes understanding of and follows CHF Action Plan  Adheres to low sodium diet  Please see past updates related to this goal by clicking on the "Past Updates" button in the selected goal        RN: I haven't been to the doctor in a while        Current Barriers:   Chronic Disease Management support, education, and care coordination needs related to Atrial Fibrillation, CHF, HLD, Anxiety, and high fall risk  Clinical Goal(s) related to Atrial Fibrillation, CHF, HLD, Anxiety, and high fall risk :  Over the next 120 days, patient will:   Work with the care management team to address educational, disease management, and care coordination needs   Begin or continue self health monitoring activities as directed today Measure and record blood pressure 7 times per week, Measure and record weight daily, and adhere to a heart Healthy diet  Call provider  office for new or worsened signs and symptoms Blood pressure findings outside established parameters, Weight outside established parameters, Shortness of breath, and New or worsened symptom related to alterations in blood pressure, patient states it is going up and down  Call care management team with questions or concerns  Verbalize basic understanding of patient centered plan of care established today  Interventions related to Atrial Fibrillation, CHF, HLD, Anxiety, and High fall risk :   Evaluation of current treatment plans and patient's adherence to plan as established by provider.  The patient has recently seen his cardiologist.  The patient is having a good day.  11/29/2020: The patient is doing well. The patient was happy and was upbeat and positive . States he is doing well. He is happy about his baby granddaughter finally being home.    Assessed patient understanding of disease states.  The patient has a good understanding of his conditions but is not always compliant with treatment plan established by his providers.   Assessed patient's education and care coordination needs- sill  limited income and resources, but does not wish to fill out paperwork- declined food resources at this time.  Denies any care coordination needs at this time.   Provided disease specific education to patient- review of available help and resources   Education on Heart Healthy diet and watching sodium content.  The patient admits he does not always follow a heart healthy diet. Explained extra water on board can cause the heart to work harder. Education on EF% and what this actually means.   Evaluation of patient depression and anxiety. The patient states he was having a really good day today. The patient denies any acute distress.   Collaborated with appropriate clinical care team members regarding patient needs- continued pharmacy support and LCSW support  Evaluation of upcoming appointments: The patient  states that he does not have any to see pcp but knows to call for changes. The patient also states that he sees his cardiologist regularly. He will get the place on his nose checked out as it is likely some kind of cancer. Reminded the patient the importance of health and well being, especially since he has his new granddaughter there living with him now.   Patient Self Care Activities related to Atrial Fibrillation, CHF, HLD, Anxiety, and High fall risk :   Patient is unable to independently self-manage chronic health conditions  Please see past updates related to this goal by clicking on the "Past Updates" button in the selected goal         There are no care plans to display for this patient.   Plan:   Telephone follow up appointment with care management team member scheduled for: 01-15-2021 at 2:15 pm   [place], MSN, [place] Mobile: [phone]

## 2020-11-24 NOTE — Progress Notes (Signed)
Evaluation Performed:  Follow-up visit  Date:  11/25/2020   ID:  Michael Delacruz, DOB 1941-02-15, MRN [id number]  Patient Location:  [place] [id number]   Provider location:   [facility], [place] office  PCP:  Michael Hauser, DO  Cardiologist:  Arvid Right [facility]  Chief Complaint  Patient presents with  . office visit    6 month F/U; Meds verbally reviewed with patient.    History of Present Illness:    Michael Delacruz is a 79 y.o. male  past medical history of long smoking history for 50 years with underlying COPD,  severe mitral valve regurgitation on echocardiogram,  prolapse of posterior leaflet,  moderate pulmonary hypertension  s/p successful MR repair by right thoracotomy by Dr. Roxy Manns 11/ 2016 Smokes 1 ppd PAF on warfarin had bleeding, now on asa, s/p Maze Surgical report indicates clipping of left atrial appendage, Atricure left atrial clip, size 45 mm Previous history of retinal bleeding felt exacerbated by warfarin , requiring surgery 2 residual right eye  vision deficits Atrial flutter EF 30 to 35% Who presents for follow up of his MR repair and atrial flutter  Some stress, newborn baby living at their house Was born prematurely  Continues to smoke  Continues to have severe vision deficits No vision in right eye, Left eye: vision problem, feels it is slowly progressing Pain left nose, extending into left cheek, left eye Scheduled to see ENT, canceled the appointment  underwent CRT-P and AV junction ablation Rare jolts in his chest, has got used to them  Lasix 3x a week Denies any shortness of breath, leg swelling PND orthopnea abdominal distention  Urine dribbles Concerned about prostate  Echo 2019 reviewed estimated ejection fraction was in the  range of 30% to 35%.  EKG personally reviewed by myself on todays visit Paced rhythm rate 80 bpm  Other past medical history  reviewed Previously declined anticoagulation and cardioversion  He was started on amiodarone as a class IIb indication for rate control   amio has been discontinued because of nightmares.    May 3335 acute systolic heart failure with symptoms of impending doom.   He was in atrial flutter with uncontrolled rate.    underwent CRT-P and AV junction ablation.    Failed Entresto in the past secondary to orthostasis    Past Medical History:  Diagnosis Date  . Arthritis   . Asthma   . Bradycardia   . CAD in native artery    a. LHC 09/2015: 40% pCx, 35% mRCA.  [name] COPD (chronic obstructive pulmonary disease) ([place])   . Dilation of intestine 01/2015  . Fibromyalgia   . Frequent headaches   . GERD (gastroesophageal reflux disease)   . History of blood clots    eye   . History of hiatal hernia   . History of rheumatic fever   . Hypertension   . Kidney stone   . PAF (paroxysmal atrial fibrillation) ([place])    a. s/p TEE/DCCV 07/2015. b. H/o bleeding on Coumadin when INR >5, changed to Eliquis.  . S/P Minimally invasive maze operation for atrial fibrillation 10/22/2015   Complete bilateral atrial lesion set using cryothermy and bipolar radiofrequency ablation with clipping of LA appendage via right mini thoracotomy approach  . S/P minimally invasive mitral valve repair 10/22/2015   Complex valvuloplasty including triangular resection of posterior leaflet, artificial Gore-tex neochord placement x6 and 38 mm Sorin Memo 3D  Rechord ring annuloplasty via right minithoracotomy approach  . Severe mitral regurgitation    a. s/p MV repair 10/2015.  [name] Sleep apnea   . Stroke ([place])   . Thyroid disorder   . TIA (transient ischemic attack)   . Tobacco abuse    Past Surgical History:  Procedure Laterality Date  . ANKLE SURGERY    . AV NODE ABLATION N/A 05/04/2018   Procedure: AV NODE ABLATION;  Surgeon: Thompson Grayer, MD;  Location: [place] [facility];  Service: Cardiovascular;  Laterality: N/A;  . BIV  PACEMAKER INSERTION CRT-P N/A 05/03/2018   Procedure: BIV PACEMAKER INSERTION CRT-P;  Surgeon: Deboraha Sprang, MD;  Location: [place] [facility];  Service: Cardiovascular;  Laterality: N/A;  . CARDIAC CATHETERIZATION N/A 10/03/2015   Procedure: Right and Left Heart Cath and Coronary Angiography;  Surgeon: [name], MD;  Location: [place] [facility];  Service: Cardiovascular;  Laterality: N/A;  . COLONOSCOPY    . ELECTROPHYSIOLOGIC STUDY N/A 08/18/2015   Procedure: CARDIOVERSION;  Surgeon: Wellington Hampshire, MD;  Location: [facility];  Service: Cardiovascular;  Laterality: N/A;  . MINIMALLY INVASIVE MAZE PROCEDURE N/A 10/22/2015   Procedure: MINIMALLY INVASIVE MAZE PROCEDURE;  Surgeon: Rexene Alberts, MD;  Location: [place];  Service: Open Heart Surgery;  Laterality: N/A;  . MITRAL VALVE REPAIR Right 10/22/2015   Procedure: MINIMALLY INVASIVE MITRAL VALVE REPAIR (MVR);  Surgeon: Rexene Alberts, MD;  Location: [place];  Service: Open Heart Surgery;  Laterality: Right;  . SINUS EXPLORATION    . TEE WITHOUT CARDIOVERSION N/A 08/18/2015   Procedure: TRANSESOPHAGEAL ECHOCARDIOGRAM (TEE);  Surgeon: Wellington Hampshire, MD;  Location: [facility];  Service: Cardiovascular;  Laterality: N/A;  . TEE WITHOUT CARDIOVERSION N/A 10/22/2015   Procedure: TRANSESOPHAGEAL ECHOCARDIOGRAM (TEE);  Surgeon: Rexene Alberts, MD;  Location: [place];  Service: Open Heart Surgery;  Laterality: N/A;     Current Meds  Medication Sig  . ALPRAZolam (XANAX) 0.25 MG tablet TAKE 1 TABLET (0.25 MG TOTAL) BY MOUTH 2 (TWO) TIMES DAILY AS NEEDED FOR ANXIETY.  . furosemide (LASIX) 40 MG tablet TAKE 1 TABLET BY MOUTH TWICE A DAY AS NEEDED  . Homeopathic Products (HOMEOPATHIC CALM SL) Place under the tongue. Is using Doterra essential oil of Onguard ingestion daily     Allergies:   Codeine, Digoxin and related, Macrodantin [nitrofurantoin macrocrystal], and Morphine and related   Social History   Tobacco Use  . Smoking status:  Current Every Day Smoker    Packs/day: 0.50    Years: 52.00    Pack years: 26.00    Types: Cigarettes    Last attempt to quit: 02/13/2018    Years since quitting: 2.7  . Smokeless tobacco: Current User  . Tobacco comment: Resumed smoking, after quit 01/2018  Vaping Use  . Vaping Use: Never used  Substance Use Topics  . Alcohol use: Not Currently    Alcohol/week: 1.0 standard drink    Types: 1 Standard drinks or equivalent per week    Comment: occasionally drinks a margarita  . Drug use: No     Current Outpatient Medications on File Prior to Visit  Medication Sig Dispense Refill  . ALPRAZolam (XANAX) 0.25 MG tablet TAKE 1 TABLET (0.25 MG TOTAL) BY MOUTH 2 (TWO) TIMES DAILY AS NEEDED FOR ANXIETY. 60 tablet 2  . furosemide (LASIX) 40 MG tablet TAKE 1 TABLET BY MOUTH TWICE A DAY AS NEEDED 180 tablet 0  . Homeopathic Products (HOMEOPATHIC CALM SL)  Place under the tongue. Is using Doterra essential oil of Onguard ingestion daily     No current facility-administered medications on file prior to visit.     Family Hx: The patient's family history includes Heart murmur in his brother; Hypertension in an other family member; Irregular heart beat in his mother; Pulmonary embolism in his brother and maternal uncle; Stroke in his mother.  ROS:   Please see the history of present illness.    Review of Systems  Constitutional: Positive for malaise/fatigue.  HENT:       Pain in his nose left side  Respiratory: Negative.   Cardiovascular: Negative.   Gastrointestinal: Negative.   Musculoskeletal: Negative.   Neurological: Negative.   Psychiatric/Behavioral: Negative.   All other systems reviewed and are negative.    Labs/Other Tests and Data Reviewed:    Recent Labs: 05/21/2020: BUN 10; Creatinine, Ser 1.07; Hemoglobin 15.8; Platelets 149; Potassium 4.5; Sodium 140   Recent Lipid Panel Lab Results  Component Value Date/Time   CHOL 215 (H) 05/01/2018 10:23 AM   TRIG 79 05/01/2018  10:23 AM   HDL 42 05/01/2018 10:23 AM   CHOLHDL 5.1 05/01/2018 10:23 AM   LDLCALC 157 (H) 05/01/2018 10:23 AM    Wt Readings from Last 3 Encounters:  11/25/20 200 lb (90.7 kg)  10/27/20 195 lb (88.5 kg)  08/05/20 199 lb (90.3 kg)     Exam:    Vital Signs: Vital signs may also be detailed in the HPI BP 130/80 (BP Location: Left Arm, Patient Position: Sitting, Cuff Size: Normal)   Pulse 80   Ht 5\' 10"  (1.778 m)   Wt 200 lb (90.7 kg)   SpO2 97%   BMI 28.70 kg/m   Constitutional:  oriented to person, place, and time. No distress.  HENT:  Head: Grossly normal Eyes:  no discharge. No scleral icterus.  Neck: No JVD, no carotid bruits  Cardiovascular: Regular rate and rhythm, no murmurs appreciated Pulmonary/Chest: Clear to auscultation bilaterally, no wheezes or rails Abdominal: Soft.  no distension.  no tenderness.  Musculoskeletal: Normal range of motion Neurological:  normal muscle tone. Coordination normal. No atrophy Skin: Skin warm and dry Psychiatric: normal affect, pleasant   ASSESSMENT & PLAN:    Chronic atrial fibrillation Does not want anticoagultion Paced Underwent AV node ablation with pacer, relatively asymptomatic  Complete heart block (HCC) paced rhythm  NICM (nonischemic cardiomyopathy) (HCC) EF 30 to 35% in 11/2018 Does not want meds Declining repeat echocardiogram Appears relatively euvolemic, takes Lasix sparingly 3 times a week " I do not like pills"  Atrial flutter, unspecified type ([place]) Previous TIA/strokes Does not want anticoagulation  Nose pain, extending into the cheek left eye Previously canceled ENT appointment, discussed with him in detail  Dilated aorta The aortic root was mild to moderately dilated 4.3 cm on echo Some financial issues, does not want echocardiogram at this time     Total encounter time more than 25 minutes  Greater than 50% was spent in counseling and coordination of care with the  patient    Signed, Ida Rogue, MD  11/25/2020 12:04 PM    [place] Office [address], [place], [place] [address]

## 2020-11-25 ENCOUNTER — Other Ambulatory Visit: Payer: Self-pay

## 2020-11-25 ENCOUNTER — Encounter: Payer: Self-pay | Admitting: Cardiovascular Disease

## 2020-11-25 ENCOUNTER — Ambulatory Visit (INDEPENDENT_AMBULATORY_CARE_PROVIDER_SITE_OTHER): Payer: Medicare HMO | Admitting: Cardiovascular Disease

## 2020-11-25 VITALS — BP 130/80 | HR 80 | Ht 70.0 in | Wt 200.0 lb

## 2020-11-25 DIAGNOSIS — I340 Nonrheumatic mitral (valve) insufficiency: Secondary | ICD-10-CM | POA: Diagnosis not present

## 2020-11-25 DIAGNOSIS — R000 Tachycardia, unspecified: Secondary | ICD-10-CM | POA: Diagnosis not present

## 2020-11-25 DIAGNOSIS — I442 Atrioventricular block, complete: Secondary | ICD-10-CM

## 2020-11-25 DIAGNOSIS — I482 Chronic atrial fibrillation, unspecified: Secondary | ICD-10-CM | POA: Diagnosis not present

## 2020-11-25 DIAGNOSIS — I4821 Permanent atrial fibrillation: Secondary | ICD-10-CM | POA: Diagnosis not present

## 2020-11-25 DIAGNOSIS — I5020 Unspecified systolic (congestive) heart failure: Secondary | ICD-10-CM

## 2020-11-25 DIAGNOSIS — I43 Cardiomyopathy in diseases classified elsewhere: Secondary | ICD-10-CM

## 2020-11-25 DIAGNOSIS — I428 Other cardiomyopathies: Secondary | ICD-10-CM | POA: Diagnosis not present

## 2020-11-25 DIAGNOSIS — I4892 Unspecified atrial flutter: Secondary | ICD-10-CM | POA: Diagnosis not present

## 2020-11-25 NOTE — Patient Instructions (Signed)
Medication Instructions:  No changes  If you need a refill on your cardiac medications before your next appointment, please call your pharmacy.    Lab work: No new labs needed   If you have labs (blood work) drawn today and your tests are completely normal, you will receive your results only by: . MyChart Message (if you have MyChart) OR . A paper copy in the mail If you have any lab test that is abnormal or we need to change your treatment, we will call you to review the results.   Testing/Procedures: No new testing needed   Follow-Up: At [facility], you and your health needs are our priority.  As part of our continuing mission to provide you with exceptional heart care, we have created designated Provider Care Teams.  These Care Teams include your primary Cardiologist (physician) and Advanced Practice Providers (APPs -  Physician Assistants and Nurse Practitioners) who all work together to provide you with the care you need, when you need it.  . You will need a follow up appointment in 12 months  . Providers on your designated Care Team:   . Christopher Berge, NP . Ryan Dunn, PA-C . Jacquelyn Visser, PA-C  Any Other Special Instructions Will Be Listed Below (If Applicable).  COVID-19 Vaccine Information can be found at: [email or web address] For questions related to vaccine distribution or appointments, please email [email or web address] or call [phone].     

## 2020-12-05 ENCOUNTER — Telehealth: Payer: Self-pay | Admitting: Family Medicine

## 2020-12-05 DIAGNOSIS — Z8673 Personal history of transient ischemic attack (TIA), and cerebral infarction without residual deficits: Secondary | ICD-10-CM | POA: Diagnosis not present

## 2020-12-05 DIAGNOSIS — Z8669 Personal history of other diseases of the nervous system and sense organs: Secondary | ICD-10-CM | POA: Diagnosis not present

## 2020-12-05 DIAGNOSIS — H5461 Unqualified visual loss, right eye, normal vision left eye: Secondary | ICD-10-CM | POA: Diagnosis not present

## 2020-12-05 DIAGNOSIS — F331 Major depressive disorder, recurrent, moderate: Secondary | ICD-10-CM | POA: Diagnosis not present

## 2020-12-05 DIAGNOSIS — F419 Anxiety disorder, unspecified: Secondary | ICD-10-CM | POA: Diagnosis not present

## 2020-12-05 DIAGNOSIS — Z6837 Body mass index (BMI) 37.0-37.9, adult: Secondary | ICD-10-CM | POA: Diagnosis not present

## 2020-12-05 NOTE — Telephone Encounter (Signed)
Unable to reach the patient --left detail message about Dr Marthann Schiller recommendation.

## 2020-12-05 NOTE — Telephone Encounter (Signed)
Can [facility] see him for this at home?  Otherwise he would need to go to Urgent Care for kidney stone if in significant pain.  Nobie Putnam, [facility] 12/05/2020, 2:38 PM

## 2020-12-05 NOTE — Telephone Encounter (Signed)
Copied from [place] [id number]. Topic: General - Call Back - No Documentation >> Dec 05, 2020  2:11 PM Erick Blinks wrote: Reason for CRM: [facility] called reporting "flank pain radiating to his groin and his back -told that he had a kidney stone, needs advice whether he needs to be seen"  BP 152/95 Heart rate 80   Best contact: [place],  [place]

## 2020-12-23 ENCOUNTER — Ambulatory Visit (INDEPENDENT_AMBULATORY_CARE_PROVIDER_SITE_OTHER): Payer: Medicare HMO

## 2020-12-23 ENCOUNTER — Telehealth: Payer: Self-pay

## 2020-12-23 ENCOUNTER — Other Ambulatory Visit: Payer: Self-pay | Admitting: Family Medicine

## 2020-12-23 VITALS — Ht 70.0 in | Wt 195.0 lb

## 2020-12-23 DIAGNOSIS — Z0000 Encounter for general adult medical examination without abnormal findings: Secondary | ICD-10-CM

## 2020-12-23 DIAGNOSIS — G4701 Insomnia due to medical condition: Secondary | ICD-10-CM

## 2020-12-23 MED ORDER — ALPRAZOLAM 0.25 MG PO TABS: 0.2500 mg | ORAL_TABLET | Freq: Two times a day (BID) | ORAL | 2 refills | Status: DC | PRN

## 2020-12-23 NOTE — Progress Notes (Signed)
I connected with Michael Delacruz today by telephone and verified that I am speaking with the correct person using two identifiers. Location patient: home Location provider: work Persons participating in the virtual visit: Naji, Pelz LPN.   I discussed the limitations, risks, security and privacy concerns of performing an evaluation and management service by telephone and the availability of in person appointments. I also discussed with the patient that there may be a patient responsible charge related to this service. The patient expressed understanding and verbally consented to this telephonic visit.    Interactive audio and video telecommunications were attempted between this provider and patient, however failed, due to patient having technical difficulties OR patient did not have access to video capability.  We continued and completed visit with audio only.     Vital signs may be patient reported or missing.  Subjective:   Michael Delacruz is a 80 y.o. male who presents for Medicare Annual/Subsequent preventive examination.  Review of Systems     Cardiac Risk Factors include: advanced age (>24men, >94 women);dyslipidemia;hypertension;male gender     Objective:    Today's Vitals   12/23/20 0911 12/23/20 0913  Weight: 195 lb (88.5 kg)   Height: 5\' 10"  (1.778 m)   PainSc:  7    Body mass index is 27.98 kg/m.  Advanced Directives 12/23/2020 02/27/2019 02/27/2019 05/03/2018 04/30/2018 04/30/2018 02/21/2018  Does Patient Have a Medical Advance Directive? No No Yes No No No No  Does patient want to make changes to medical advance directive? - Yes (MAU/Ambulatory/Procedural Areas - Information given) Yes (MAU/Ambulatory/Procedural Areas - Information given) - - - -  Would patient like information on creating a medical advance directive? - - - No - Patient declined No - Patient declined No - Patient declined No - Patient declined    Current Medications  (verified) Outpatient Encounter Medications as of 12/23/2020  Medication Sig  . ALPRAZolam (XANAX) 0.25 MG tablet TAKE 1 TABLET (0.25 MG TOTAL) BY MOUTH 2 (TWO) TIMES DAILY AS NEEDED FOR ANXIETY.  . furosemide (LASIX) 40 MG tablet TAKE 1 TABLET BY MOUTH TWICE A DAY AS NEEDED  . Homeopathic Products (HOMEOPATHIC CALM SL) Place under the tongue. Is using Doterra essential oil of Onguard ingestion daily   No facility-administered encounter medications on file as of 12/23/2020.    Allergies (verified) Codeine, Digoxin and related, Macrodantin [nitrofurantoin macrocrystal], and Morphine and related   History: Past Medical History:  Diagnosis Date  . Arthritis   . Asthma   . Bradycardia   . CAD in native artery    a. LHC 09/2015: 40% pCx, 35% mRCA.  [name] COPD (chronic obstructive pulmonary disease) ([place])   . Dilation of intestine 01/2015  . Fibromyalgia   . Frequent headaches   . GERD (gastroesophageal reflux disease)   . History of blood clots    eye   . History of hiatal hernia   . History of rheumatic fever   . Hypertension   . Kidney stone   . PAF (paroxysmal atrial fibrillation) ([place])    a. s/p TEE/DCCV 07/2015. b. H/o bleeding on Coumadin when INR >5, changed to Eliquis.  . S/P Minimally invasive maze operation for atrial fibrillation 10/22/2015   Complete bilateral atrial lesion set using cryothermy and bipolar radiofrequency ablation with clipping of LA appendage via right mini thoracotomy approach  . S/P minimally invasive mitral valve repair 10/22/2015   Complex valvuloplasty including triangular resection of posterior leaflet, artificial Gore-tex neochord placement x6 and 38  mm Sorin Memo 3D Rechord ring annuloplasty via right minithoracotomy approach  . Severe mitral regurgitation    a. s/p MV repair 10/2015.  [name] Sleep apnea   . Stroke ([place])   . Thyroid disorder   . TIA (transient ischemic attack)   . Tobacco abuse    Past Surgical History:  Procedure Laterality Date  .  ANKLE SURGERY    . AV NODE ABLATION N/A 05/04/2018   Procedure: AV NODE ABLATION;  Surgeon: Thompson Grayer, MD;  Location: [place];  Service: Cardiovascular;  Laterality: N/A;  . BIV PACEMAKER INSERTION CRT-P N/A 05/03/2018   Procedure: BIV PACEMAKER INSERTION CRT-P;  Surgeon: Deboraha Sprang, MD;  Location: [facility];  Service: Cardiovascular;  Laterality: N/A;  . CARDIAC CATHETERIZATION N/A 10/03/2015   Procedure: Right and Left Heart Cath and Coronary Angiography;  Surgeon: [name], MD;  Location: [place] [facility];  Service: Cardiovascular;  Laterality: N/A;  . COLONOSCOPY    . ELECTROPHYSIOLOGIC STUDY N/A 08/18/2015   Procedure: CARDIOVERSION;  Surgeon: Wellington Hampshire, MD;  Location: [facility];  Service: Cardiovascular;  Laterality: N/A;  . MINIMALLY INVASIVE MAZE PROCEDURE N/A 10/22/2015   Procedure: MINIMALLY INVASIVE MAZE PROCEDURE;  Surgeon: Rexene Alberts, MD;  Location: [place];  Service: Open Heart Surgery;  Laterality: N/A;  . MITRAL VALVE REPAIR Right 10/22/2015   Procedure: MINIMALLY INVASIVE MITRAL VALVE REPAIR (MVR);  Surgeon: Rexene Alberts, MD;  Location: [place];  Service: Open Heart Surgery;  Laterality: Right;  . SINUS EXPLORATION    . TEE WITHOUT CARDIOVERSION N/A 08/18/2015   Procedure: TRANSESOPHAGEAL ECHOCARDIOGRAM (TEE);  Surgeon: Wellington Hampshire, MD;  Location: [facility];  Service: Cardiovascular;  Laterality: N/A;  . TEE WITHOUT CARDIOVERSION N/A 10/22/2015   Procedure: TRANSESOPHAGEAL ECHOCARDIOGRAM (TEE);  Surgeon: Rexene Alberts, MD;  Location: [place];  Service: Open Heart Surgery;  Laterality: N/A;   Family History  Problem Relation Age of Onset  . Stroke Mother   . Irregular heart beat Mother   . Heart murmur Brother   . Pulmonary embolism Brother   . Hypertension Other   . Pulmonary embolism Maternal Uncle    Social History   Socioeconomic History  . Marital status: Married    Spouse name: [name]  . Number of children: 3   . Years of education: GED  . Highest education level: GED or equivalent  Occupational History  . Not on file  Tobacco Use  . Smoking status: Current Every Day Smoker    Packs/day: 0.50    Years: 52.00    Pack years: 26.00    Types: Cigarettes    Last attempt to quit: 02/13/2018    Years since quitting: 2.8  . Smokeless tobacco: Current User  . Tobacco comment: Resumed smoking, after quit 01/2018  Vaping Use  . Vaping Use: Never used  Substance and Sexual Activity  . Alcohol use: Not Currently    Alcohol/week: 1.0 standard drink    Types: 1 Standard drinks or equivalent per week    Comment: occasionally drinks a margarita  . Drug use: No  . Sexual activity: Not on file  Other Topics Concern  . Not on file  Social History Narrative   Right handed    2-3 cups coffee per day         Social Determinants of Health   Financial Resource Strain: High Risk  . Difficulty of Paying Living Expenses: Hard  Food Insecurity: Food Insecurity  Present  . Worried About Charity fundraiser in the Last Year: Often true  . Ran Out of Food in the Last Year: Often true  Transportation Needs: No Transportation Needs  . Lack of Transportation (Medical): No  . Lack of Transportation (Non-Medical): No  Physical Activity: Inactive  . Days of Exercise per Week: 0 days  . Minutes of Exercise per Session: 0 min  Stress: No Stress Concern Present  . Feeling of Stress : Not at all  Social Connections: Moderately Isolated  . Frequency of Communication with Friends and Family: More than three times a week  . Frequency of Social Gatherings with Friends and Family: More than three times a week  . Attends Religious Services: Never  . Active Member of Clubs or Organizations: No  . Attends Archivist Meetings: Never  . Marital Status: Married    Tobacco Counseling Ready to quit: No Counseling given: Not Answered Comment: Resumed smoking, after quit 01/2018   Clinical Intake:  Pre-visit  preparation completed: Yes  Pain : 0-10 Pain Score: 7  Pain Type: Chronic pain Pain Location: Generalized Pain Descriptors / Indicators: Aching Pain Onset: More than a month ago Pain Frequency: Constant     Nutritional Status: BMI 25 -29 Overweight Nutritional Risks: None Diabetes: No  How often do you need to have someone help you when you read instructions, pamphlets, or other written materials from your doctor or pharmacy?: 1 - Never What is the last grade level you completed in school?: GED  Diabetic? no  Interpreter Needed?: No  Information entered by :: NAllen LPN   Activities of Daily Living In your present state of health, do you have any difficulty performing the following activities: 12/23/2020  Hearing? N  Vision? Y  Comment can't see out one eye  Difficulty concentrating or making decisions? Y  Comment trouble with memory  Walking or climbing stairs? N  Dressing or bathing? N  Doing errands, shopping? N  Preparing Food and eating ? N  Using the Toilet? N  In the past six months, have you accidently leaked urine? N  Do you have problems with loss of bowel control? N  Managing your Medications? N  Managing your Finances? N  Housekeeping or managing your Housekeeping? N  Some recent data might be hidden    Patient Care Team: [name], DO as PCP - General (Family Medicine) [name], MD as PCP - Cardiology (Cardiology) [name], MD as Consulting Physician (Cardiology) Greg Cutter, LCSW as Social Worker (Licensed Clinical Social Worker) Hall Busing, Nobie Putnam, RN as Case Manager ([place]) Whitten, Virl Diamond, [facility] as Pharmacist  Indicate any recent Medical Services you may have received from other than [facility] providers in the past year (date may be approximate).     Assessment:   This is a routine wellness examination for Caprice.  Hearing/Vision screen  Hearing Screening   125Hz  250Hz  500Hz  1000Hz  2000Hz  3000Hz   4000Hz  6000Hz  8000Hz   Right ear:           Left ear:           Vision Screening Comments: No regular eye exams  Dietary issues and exercise activities discussed: Current Exercise Habits: The patient does not participate in regular exercise at present  Goals    .  DIET - INCREASE WATER INTAKE      recommend drinking at least 6-9 glasses of water a day     .  Exercise 3x per  week (30 min per time)      Walk 2-3 times a week for 20-30 minutes.     .  Patient Stated      12/23/2020, would like to be more mobile    .  RN: I have a lot of problems (pt-stated)      Current Barriers:  [name] Knowledge deficit related to basic heart failure pathophysiology and self care management . Knowledge Deficits related to heart failure medications . Financial strain . Cognitive Deficits-Patient reported memory issues  Case Manager Clinical Goal(s):  [name] Over the next 120 days, patient will weigh self daily and record . Over the next 120 days, patient will verbalize understanding of Heart Failure Action Plan and when to call doctor . Over the next 120 days, patient will weigh daily and record (notifying MD of 3 lb weight gain over night or 5 lb in a week) . Over the next 120 days the patient will verbalize an increase of strength and no reported falls  Interventions:  . Basic overview and discussion of pathophysiology of Heart Failure reviewed  . Provided verbal education on low sodium diet, the patient has limited food resources and has been eating a lot of peanut butter and jelly sandwiches. 2020-11-22: The patient does not watch his sodium content. The patient states he watches salt fall on his food. Education given on water following salt and the need to watch his sodium intake or use a salt substitute. Review today of watching hidden sodium and being mindful of sodium.   . Care guide referral to help with food resources for patient to be able to follow prescribed dietary restrictions.  06-09-2020: The  patient has spoke to the Care guides and has resources, often does not utilize information given. 11-22-2020: The patient denies any needs at this time. Will continue to monitor.   . Advised patient to weigh each morning after emptying bladder . Discussed importance of daily weight and advised patient to weigh and record daily.  Review and patient is not weighing daily.  . (patient is weighing every other day- but not recording- encouraged keeping a record-yesterday 199 . Reviewed role of diuretics in prevention of fluid overload and management of heart failure-the patient has had some water weight on board lately. 11/22/2020: The patient takes medications "most of the time" as directed. Denies swelling in his feet and legs. The patient states he is compliant with his medications. . Advised patient, providing education and rationale, to weigh daily and record, calling PCP for weight gain of 3lbs overnight or 5 pounds in a week. Patient reports weighing daily and taking his lasix when he is up in his weight. He reports his weight is 199 yesterday . Discussed with patient the importance of exercise in increasing endurance and strength which patient stated he wanted to do. The patient states he is not doing a lot of exercise. "I feel so lazy and useless" "I feel like crap"- denies suicidal ideation  . Empathetic listening, patient very talkative about his health, his past life and recent losses he has had in his family, recent death of his "baby" sister. Nov 22, 2020: The patient is doing better with his depression and anxiety. The patient has a new granddaughter and was excited to tell the RNCM about his new granddaughter.  Patient Self Care Activities:  . Takes Heart Failure Medications as prescribed . Weighs daily and record (notifying MD of 3 lb weight gain over night or 5 lb in a week) .  Verbalizes understanding of and follows CHF Action Plan . Adheres to low sodium diet  Please see past updates  related to this goal by clicking on the "Past Updates" button in the selected goal      .  RN: I haven't been to the doctor in a while      Current Barriers:  . Chronic Disease Management support, education, and care coordination needs related to Atrial Fibrillation, CHF, HLD, Anxiety, and high fall risk  Clinical Goal(s) related to Atrial Fibrillation, CHF, HLD, Anxiety, and high fall risk :  Over the next 120 days, patient will:  . Work with the care management team to address educational, disease management, and care coordination needs  . Begin or continue self health monitoring activities as directed today Measure and record blood pressure 7 times per week, Measure and record weight daily, and adhere to a heart Healthy diet . Call provider office for new or worsened signs and symptoms Blood pressure findings outside established parameters, Weight outside established parameters, Shortness of breath, and New or worsened symptom related to alterations in blood pressure, patient states it is going up and down . Call care management team with questions or concerns . Verbalize basic understanding of patient centered plan of care established today  Interventions related to Atrial Fibrillation, CHF, HLD, Anxiety, and High fall risk :  . Evaluation of current treatment plans and patient's adherence to plan as established by provider.  The patient has recently seen his cardiologist.  The patient is having a good day.  11-20-2020: The patient is doing well. The patient was happy and was upbeat and positive . States he is doing well. He is happy about his baby granddaughter finally being home.   . Assessed patient understanding of disease states.  The patient has a good understanding of his conditions but is not always compliant with treatment plan established by his providers.  . Assessed patient's education and care coordination needs- sill limited income and resources, but does not wish to fill out  paperwork- declined food resources at this time.  Denies any care coordination needs at this time.  . Provided disease specific education to patient- review of available help and resources  . Education on Heart Healthy diet and watching sodium content.  The patient admits he does not always follow a heart healthy diet. Explained extra water on board can cause the heart to work harder. Education on EF% and what this actually means.  . Evaluation of patient depression and anxiety. The patient states he was having a really good day today. The patient denies any acute distress.  Steele Sizer with appropriate clinical care team members regarding patient needs- continued pharmacy support and LCSW support . Evaluation of upcoming appointments: The patient states that he does not have any to see pcp but knows to call for changes. The patient also states that he sees his cardiologist regularly. He will get the place on his nose checked out as it is likely some kind of cancer. Reminded the patient the importance of health and well being, especially since he has his new granddaughter there living with him now.   Patient Self Care Activities related to Atrial Fibrillation, CHF, HLD, Anxiety, and High fall risk :  . Patient is unable to independently self-manage chronic health conditions  Please see past updates related to this goal by clicking on the "Past Updates" button in the selected goal      .  SW-"I need to take better  care of myself." (pt-stated)      Current Barriers:  . Financial constraints related to managing health care . Housing barriers . ADL IADL limitations . Social Isolation  Clinical Social Work Clinical Goal(s):  [name] Over the next 90 days, client will work with SW to address concerns related to knowledge deficit of appropriate self-care and depression management tools to implement into his daily routine to promote healthy living   Interventions: . Patient interviewed and appropriate  assessments performed . Provided patient with information about what healthy self-care looks like . Discussed plans with patient for ongoing care management follow up and provided patient with direct contact information for care management team . Advised patient to consider grief therapy due to the loss he has experienced this year. Patient declined referral but was appreciative of education on resource. UPDATE- Patient declined grief counseling referral again on 03/11/2020. . Assisted patient/caregiver with obtaining information about health plan benefits . Provided education on available community resources within the local area that patient may benefit from. . Provided mental health counseling with regard to his daily difficulty with mobility/weakness/balance. Patient has frequent falls and does not have a medical alert system. LCSW used active and reflective listening and implemented appropriate interventions to help suppport patient and his emotional needs. Advised patient to implement deep breathing/grounding/meditation/self-care exercises into his daily routine to combat stressors. Education provided.  . Patient is 3 months late on rent and is experiencing food insecurity. Patient reports that his financial difficulties have increased his stress, anxiety and depression. . Patient ask if C3 Guide spoke with his landlord. LCSW will send in basket message to her requesting this question. C3 Guide is currently working on [employer] application for patient (regarding rent assistance).  Patient Self Care Activities:  . Attends all scheduled provider appointments . Calls provider office for new concerns or questions  Please see past updates related to this goal by clicking on the "Past Updates" button in the selected goal         Depression Screen PHQ 2/9 Scores 12/23/2020 06/09/2020 02/21/2020 09/24/2019 06/11/2019 02/27/2019 01/28/2019  PHQ - 2 Score 3 3 5 3 1 5 4   PHQ- 9 Score 8 12 15 11 5 18 15     Fall  Risk Fall Risk  12/23/2020 07/02/2019 02/27/2019 02/21/2018  Falls in the past year? 1 1 1  No  Comment fell down step into the yard - - -  Number falls in past yr: 0 0 1 -  Comment - - 3-4 -  Injury with Fall? 0 0 1 -  Risk for fall due to : Impaired vision;Impaired balance/gait;Medication side effect Other (Comment) Impaired balance/gait -  Risk for fall due to: Comment - gets dizzy - -  Follow up Falls evaluation completed;Education provided;Falls prevention discussed Falls prevention discussed;Education provided;Falls evaluation completed Falls evaluation completed;Falls prevention discussed;Education provided -    FALL RISK PREVENTION PERTAINING TO THE HOME:  Any stairs in or around the home? Yes  If so, are there any without handrails? No  Home free of loose throw rugs in walkways, pet beds, electrical cords, etc? Yes  Adequate lighting in your home to reduce risk of falls? Yes   ASSISTIVE DEVICES UTILIZED TO PREVENT FALLS:  Life alert? No  Use of a cane, walker or w/c? No  Grab bars in the bathroom? Yes  Shower chair or bench in shower? No  Elevated toilet seat or a handicapped toilet? No   TIMED UP AND GO:  Was the test  performed? No   Cognitive Function:     6CIT Screen 12/23/2020 02/27/2019 02/21/2018  What Year? 0 points 0 points 0 points  What month? 0 points 0 points 0 points  What time? 0 points 0 points 0 points  Count back from 20 0 points 0 points 0 points  Months in reverse 4 points 2 points 0 points  Repeat phrase 2 points 0 points 0 points  Total Score 6 2 0    Immunizations There is no immunization history for the selected administration types on file for this patient.  TDAP status: Due, Education has been provided regarding the importance of this vaccine. Advised may receive this vaccine at local pharmacy or Health Dept. Aware to provide a copy of the vaccination record if obtained from local pharmacy or Health Dept. Verbalized acceptance and  understanding.  Flu Vaccine status: Declined, Education has been provided regarding the importance of this vaccine but patient still declined. Advised may receive this vaccine at local pharmacy or Health Dept. Aware to provide a copy of the vaccination record if obtained from local pharmacy or Health Dept. Verbalized acceptance and understanding.  Pneumococcal vaccine status: Declined,  Education has been provided regarding the importance of this vaccine but patient still declined. Advised may receive this vaccine at local pharmacy or Health Dept. Aware to provide a copy of the vaccination record if obtained from local pharmacy or Health Dept. Verbalized acceptance and understanding.   Covid-19 vaccine status: Completed vaccines  Qualifies for Shingles Vaccine? Yes   Zostavax completed No   Shingrix Completed?: No.    Education has been provided regarding the importance of this vaccine. Patient has been advised to call insurance company to determine out of pocket expense if they have not yet received this vaccine. Advised may also receive vaccine at local pharmacy or Health Dept. Verbalized acceptance and understanding.  Screening Tests Health Maintenance  Topic Date Due  . Hepatitis C Screening  Never done  . COVID-19 Vaccine (1) 01/08/2021 (Originally 05/16/1953)  . INFLUENZA VACCINE  03/19/2021 (Originally 07/20/2020)  . TETANUS/TDAP  12/23/2021 (Originally 05/15/1960)  . PNA vac Low Risk Adult (1 of 2 - PCV13) 12/23/2021 (Originally 05/15/2008)    Health Maintenance  Health Maintenance Due  Topic Date Due  . Hepatitis C Screening  Never done    Colorectal cancer screening: decline  Lung Cancer Screening: (Low Dose CT Chest recommended if Age 58-80 years, 30 pack-year currently smoking OR have quit w/in 15years.) does not qualify.   Lung Cancer Screening Referral: no  Additional Screening:  Hepatitis C Screening: does qualify; due  Vision Screening: Recommended annual  ophthalmology exams for early detection of glaucoma and other disorders of the eye. Is the patient up to date with their annual eye exam?  No  Who is the provider or what is the name of the office in which the patient attends annual eye exams? none If pt is not established with a provider, would they like to be referred to a provider to establish care? No .   Dental Screening: Recommended annual dental exams for proper oral hygiene  Community Resource Referral / Chronic Care Management: CRR required this visit?  No   CCM required this visit?  No      Plan:     I have personally reviewed and noted the following in the patient's chart:   . Medical and social history . Use of alcohol, tobacco or illicit drugs  . Current medications and supplements . Functional  ability and status . Nutritional status . Physical activity . Advanced directives . List of other physicians . Hospitalizations, surgeries, and ER visits in previous 12 months . Vitals . Screenings to include cognitive, depression, and falls . Referrals and appointments  In addition, I have reviewed and discussed with patient certain preventive protocols, quality metrics, and best practice recommendations. A written personalized care plan for preventive services as well as general preventive health recommendations were provided to patient.     Kellie Simmering, LPN   [id number]   Nurse Notes:

## 2020-12-23 NOTE — Patient Instructions (Signed)
[name] , Thank you for taking time to come for your Medicare Wellness Visit. I appreciate your ongoing commitment to your health goals. Please review the following plan we discussed and let me know if I can assist you in the future.   Screening recommendations/referrals: Colonoscopy: decline Recommended yearly ophthalmology/optometry visit for glaucoma screening and checkup Recommended yearly dental visit for hygiene and checkup  Vaccinations: Influenza vaccine: decline Pneumococcal vaccine: decline Tdap vaccine: decline Shingles vaccine: decline   Covid-19: decline  Advanced directives: smolking  Conditions/risks identified: none  Next appointment: Follow up in one year for your annual wellness visit.   Preventive Care 50 Years and Older, Male Preventive care refers to lifestyle choices and visits with your health care provider that can promote health and wellness. What does preventive care include?  A yearly physical exam. This is also called an annual well check.  Dental exams once or twice a year.  Routine eye exams. Ask your health care provider how often you should have your eyes checked.  Personal lifestyle choices, including:  Daily care of your teeth and gums.  Regular physical activity.  Eating a healthy diet.  Avoiding tobacco and drug use.  Limiting alcohol use.  Practicing safe sex.  Taking low doses of aspirin every day.  Taking vitamin and mineral supplements as recommended by your health care provider. What happens during an annual well check? The services and screenings done by your health care provider during your annual well check will depend on your age, overall health, lifestyle risk factors, and family history of disease. Counseling  Your health care provider may ask you questions about your:  Alcohol use.  Tobacco use.  Drug use.  Emotional well-being.  Home and relationship well-being.  Sexual activity.  Eating  habits.  History of falls.  Memory and ability to understand (cognition).  Work and work Statistician. Screening  You may have the following tests or measurements:  Height, weight, and BMI.  Blood pressure.  Lipid and cholesterol levels. These may be checked every 5 years, or more frequently if you are over 81 years old.  Skin check.  Lung cancer screening. You may have this screening every year starting at age 2 if you have a 30-pack-year history of smoking and currently smoke or have quit within the past 15 years.  Fecal occult blood test (FOBT) of the stool. You may have this test every year starting at age 42.  Flexible sigmoidoscopy or colonoscopy. You may have a sigmoidoscopy every 5 years or a colonoscopy every 10 years starting at age 16.  Prostate cancer screening. Recommendations will vary depending on your family history and other risks.  Hepatitis C blood test.  Hepatitis B blood test.  Sexually transmitted disease (STD) testing.  Diabetes screening. This is done by checking your blood sugar (glucose) after you have not eaten for a while (fasting). You may have this done every 1-3 years.  Abdominal aortic aneurysm (AAA) screening. You may need this if you are a current or former smoker.  Osteoporosis. You may be screened starting at age 85 if you are at high risk. Talk with your health care provider about your test results, treatment options, and if necessary, the need for more tests. Vaccines  Your health care provider may recommend certain vaccines, such as:  Influenza vaccine. This is recommended every year.  Tetanus, diphtheria, and acellular pertussis (Tdap, Td) vaccine. You may need a Td booster every 10 years.  Zoster vaccine. You may need  this after age 67.  Pneumococcal 13-valent conjugate (PCV13) vaccine. One dose is recommended after age 32.  Pneumococcal polysaccharide (PPSV23) vaccine. One dose is recommended after age 32. Talk to your health  care provider about which screenings and vaccines you need and how often you need them. This information is not intended to replace advice given to you by your health care provider. Make sure you discuss any questions you have with your health care provider. Document Released: 01/02/2016 Document Revised: 08/25/2016 Document Reviewed: 10/07/2015 Elsevier Interactive Patient Education  2017 [place] Prevention in the Home Falls can cause injuries. They can happen to people of all ages. There are many things you can do to make your home safe and to help prevent falls. What can I do on the outside of my home?  Regularly fix the edges of walkways and driveways and fix any cracks.  Remove anything that might make you trip as you walk through a door, such as a raised step or threshold.  Trim any bushes or trees on the path to your home.  Use bright outdoor lighting.  Clear any walking paths of anything that might make someone trip, such as rocks or tools.  Regularly check to see if handrails are loose or broken. Make sure that both sides of any steps have handrails.  Any raised decks and porches should have guardrails on the edges.  Have any leaves, snow, or ice cleared regularly.  Use sand or salt on walking paths during winter.  Clean up any spills in your garage right away. This includes oil or grease spills. What can I do in the bathroom?  Use night lights.  Install grab bars by the toilet and in the tub and shower. Do not use towel bars as grab bars.  Use non-skid mats or decals in the tub or shower.  If you need to sit down in the shower, use a plastic, non-slip stool.  Keep the floor dry. Clean up any water that spills on the floor as soon as it happens.  Remove soap buildup in the tub or shower regularly.  Attach bath mats securely with double-sided non-slip rug tape.  Do not have throw rugs and other things on the floor that can make you trip. What can I do  in the bedroom?  Use night lights.  Make sure that you have a light by your bed that is easy to reach.  Do not use any sheets or blankets that are too big for your bed. They should not hang down onto the floor.  Have a firm chair that has side arms. You can use this for support while you get dressed.  Do not have throw rugs and other things on the floor that can make you trip. What can I do in the kitchen?  Clean up any spills right away.  Avoid walking on wet floors.  Keep items that you use a lot in easy-to-reach places.  If you need to reach something above you, use a strong step stool that has a grab bar.  Keep electrical cords out of the way.  Do not use floor polish or wax that makes floors slippery. If you must use wax, use non-skid floor wax.  Do not have throw rugs and other things on the floor that can make you trip. What can I do with my stairs?  Do not leave any items on the stairs.  Make sure that there are handrails on both sides of  the stairs and use them. Fix handrails that are broken or loose. Make sure that handrails are as long as the stairways.  Check any carpeting to make sure that it is firmly attached to the stairs. Fix any carpet that is loose or worn.  Avoid having throw rugs at the top or bottom of the stairs. If you do have throw rugs, attach them to the floor with carpet tape.  Make sure that you have a light switch at the top of the stairs and the bottom of the stairs. If you do not have them, ask someone to add them for you. What else can I do to help prevent falls?  Wear shoes that:  Do not have high heels.  Have rubber bottoms.  Are comfortable and fit you well.  Are closed at the toe. Do not wear sandals.  If you use a stepladder:  Make sure that it is fully opened. Do not climb a closed stepladder.  Make sure that both sides of the stepladder are locked into place.  Ask someone to hold it for you, if possible.  Clearly mark  and make sure that you can see:  Any grab bars or handrails.  First and last steps.  Where the edge of each step is.  Use tools that help you move around (mobility aids) if they are needed. These include:  Canes.  Walkers.  Scooters.  Crutches.  Turn on the lights when you go into a dark area. Replace any light bulbs as soon as they burn out.  Set up your furniture so you have a clear path. Avoid moving your furniture around.  If any of your floors are uneven, fix them.  If there are any pets around you, be aware of where they are.  Review your medicines with your doctor. Some medicines can make you feel dizzy. This can increase your chance of falling. Ask your doctor what other things that you can do to help prevent falls. This information is not intended to replace advice given to you by your health care provider. Make sure you discuss any questions you have with your health care provider. Document Released: 10/02/2009 Document Revised: 05/13/2016 Document Reviewed: 01/10/2015 Elsevier Interactive Patient Education  2017 [employer].

## 2020-12-23 NOTE — Telephone Encounter (Signed)
Patient consented to telehealth visit. 

## 2020-12-29 ENCOUNTER — Telehealth: Payer: Self-pay | Admitting: Family Medicine

## 2020-12-29 NOTE — Telephone Encounter (Signed)
   Referral Reason: Financial Difficulties related to utilities and rent.   Interventions: Successful outbound call placed to the patient Patient stated that he is still in need of assistance with rent and utilities. Michael Delacruz stated that his rent is $1135 a month, but he is unsure how much the utilites are because his wife handles those bills. Patient stated that he will give wife Care Guide's information and have her call the office back to discuss.   Follow up plan: Care guide will follow up with patient by phone over the next week, and wait to receive call from patient's wife.

## 2020-12-29 NOTE — Telephone Encounter (Signed)
   Referral Reason: Financial Difficulties related to utilities and rent.   Interventions: Successful outbound call placed to the patient Patient stated that he is still in need of assistance with rent and utilities. Michael Delacruz stated that his rent is $1135 a month, but he is unsure how much the utilites are because his wife handles those bills. Patient stated that he will give wife Care Guide's information and have her call the office back to discuss.   Follow up plan: Care guide will follow up with patient by phone over the next week, and wait to receive call from patient's wife.    

## 2021-01-08 NOTE — Telephone Encounter (Signed)
   Telephone encounter was:  Successful.  01/08/2021 Name: Michael Delacruz MRN: [id number] DOB: 09/22/1941  Michael Delacruz is a 80 y.o. year old male who is a primary care patient of [name], DO . The community resource team was consulted for assistance with Financial Difficulties related to rent, utilities, and food.   Care guide performed the following interventions: Discussed resources to assist with financial assistance  Informed patient that Care Guide will complete the referral for a [employer] card from [facility] and also send a Publishing rights manager with a list of or  organizations that might be able to assist him in the future. Patient stated understanding. Also informed patient that if he has any additional needs to give the office a call. Patient stated that he may need assistance with his [employer]. Asked patient if he has a copy of his bill that he can send to Care Guide via e-mail. Mr. Schweer stated that he does not have access to his e-mail, but if he comes across a copy of his bill he will try to drop it off at the office. Informed Mr. Claggett to give Care Guide a call if he comes across the bill. Patient stated understanding.   Follow Up Plan:  No further assistance at this time. Care Guide has completed referral for [employer] gift card and also sent patient a resource letter with list of organizations that might be able to assist with rent and utilities.   [place], Care Management Phone: [phone] Email: [email or web address]

## 2021-01-09 ENCOUNTER — Encounter: Payer: Self-pay | Admitting: Family Medicine

## 2021-01-13 NOTE — Telephone Encounter (Signed)
   Telephone encounter was:  Successful.  01/13/2021 Name: [name] MRN: [id number] DOB: 20-Jan-1941  [name] is a 80 y.o. year old male who is a primary care patient of [name], DO . The community resource team was consulted for assistance with Reading guide performed the following interventions: Informed patient that the Corn from [facility]  has been sent to the office and should arrive by Friday, 01/16/21. Patient stated understanding. .  Follow Up Plan:  No further follow up planned at this time. The patient has been provided with needed resources.  [place], Care Management Phone: [phone] Email: [email or web address]

## 2021-01-15 ENCOUNTER — Telehealth: Payer: Self-pay | Admitting: General Practice

## 2021-01-15 ENCOUNTER — Ambulatory Visit: Payer: Self-pay | Admitting: General Practice

## 2021-01-15 DIAGNOSIS — I5032 Chronic diastolic (congestive) heart failure: Secondary | ICD-10-CM

## 2021-01-15 DIAGNOSIS — F411 Generalized anxiety disorder: Secondary | ICD-10-CM

## 2021-01-15 DIAGNOSIS — Z8673 Personal history of transient ischemic attack (TIA), and cerebral infarction without residual deficits: Secondary | ICD-10-CM

## 2021-01-15 DIAGNOSIS — F331 Major depressive disorder, recurrent, moderate: Secondary | ICD-10-CM

## 2021-01-15 NOTE — Patient Instructions (Signed)
Visit Information  Goals Addressed              This Visit's Progress   .  COMPLETED: RN: I have a lot of problems (pt-stated)        Current Barriers: Closing this goal and opening in new ELS . Knowledge deficit related to basic heart failure pathophysiology and self care management . Knowledge Deficits related to heart failure medications . Financial strain . Cognitive Deficits-Patient reported memory issues  Case Manager Clinical Goal(s):  [name] Over the next 120 days, patient will weigh self daily and record . Over the next 120 days, patient will verbalize understanding of Heart Failure Action Plan and when to call doctor . Over the next 120 days, patient will weigh daily and record (notifying MD of 3 lb weight gain over night or 5 lb in a week) . Over the next 120 days the patient will verbalize an increase of strength and no reported falls  Interventions:  . Basic overview and discussion of pathophysiology of Heart Failure reviewed  . Provided verbal education on low sodium diet, the patient has limited food resources and has been eating a lot of peanut butter and jelly sandwiches. 28-Nov-2020: The patient does not watch his sodium content. The patient states he watches salt fall on his food. Education given on water following salt and the need to watch his sodium intake or use a salt substitute. Review today of watching hidden sodium and being mindful of sodium.   . Care guide referral to help with food resources for patient to be able to follow prescribed dietary restrictions.  06-09-2020: The patient has spoke to the Care guides and has resources, often does not utilize information given. 28-Nov-2020: The patient denies any needs at this time. Will continue to monitor.   . Advised patient to weigh each morning after emptying bladder . Discussed importance of daily weight and advised patient to weigh and record daily.  Review and patient is not weighing daily.  . (patient is weighing  every other day- but not recording- encouraged keeping a record-yesterday 199 . Reviewed role of diuretics in prevention of fluid overload and management of heart failure-the patient has had some water weight on board lately. 11/28/20: The patient takes medications "most of the time" as directed. Denies swelling in his feet and legs. The patient states he is compliant with his medications. . Advised patient, providing education and rationale, to weigh daily and record, calling PCP for weight gain of 3lbs overnight or 5 pounds in a week. Patient reports weighing daily and taking his lasix when he is up in his weight. He reports his weight is 199 yesterday . Discussed with patient the importance of exercise in increasing endurance and strength which patient stated he wanted to do. The patient states he is not doing a lot of exercise. "I feel so lazy and useless" "I feel like crap"- denies suicidal ideation  . Empathetic listening, patient very talkative about his health, his past life and recent losses he has had in his family, recent death of his "baby" sister. November 28, 2020: The patient is doing better with his depression and anxiety. The patient has a new granddaughter and was excited to tell the RNCM about his new granddaughter.  Patient Self Care Activities:  . Takes Heart Failure Medications as prescribed . Weighs daily and record (notifying MD of 3 lb weight gain over night or 5 lb in a week) . Verbalizes understanding of and follows CHF Action Plan .  Adheres to low sodium diet  Please see past updates related to this goal by clicking on the "Past Updates" button in the selected goal      .  COMPLETED: RN: I haven't been to the doctor in a while        Current Barriers: Closing this goal and opening in new ELS . Chronic Disease Management support, education, and care coordination needs related to Atrial Fibrillation, CHF, HLD, Anxiety, and high fall risk  Clinical Goal(s) related to Atrial  Fibrillation, CHF, HLD, Anxiety, and high fall risk :  Over the next 120 days, patient will:  . Work with the care management team to address educational, disease management, and care coordination needs  . Begin or continue self health monitoring activities as directed today Measure and record blood pressure 7 times per week, Measure and record weight daily, and adhere to a heart Healthy diet . Call provider office for new or worsened signs and symptoms Blood pressure findings outside established parameters, Weight outside established parameters, Shortness of breath, and New or worsened symptom related to alterations in blood pressure, patient states it is going up and down . Call care management team with questions or concerns . Verbalize basic understanding of patient centered plan of care established today  Interventions related to Atrial Fibrillation, CHF, HLD, Anxiety, and High fall risk :  . Evaluation of current treatment plans and patient's adherence to plan as established by provider.  The patient has recently seen his cardiologist.  The patient is having a good day.  11-20-2020: The patient is doing well. The patient was happy and was upbeat and positive . States he is doing well. He is happy about his baby granddaughter finally being home.   . Assessed patient understanding of disease states.  The patient has a good understanding of his conditions but is not always compliant with treatment plan established by his providers.  . Assessed patient's education and care coordination needs- sill limited income and resources, but does not wish to fill out paperwork- declined food resources at this time.  Denies any care coordination needs at this time.  . Provided disease specific education to patient- review of available help and resources  . Education on Heart Healthy diet and watching sodium content.  The patient admits he does not always follow a heart healthy diet. Explained extra water on board  can cause the heart to work harder. Education on EF% and what this actually means.  . Evaluation of patient depression and anxiety. The patient states he was having a really good day today. The patient denies any acute distress.  Nash Dimmer with appropriate clinical care team members regarding patient needs- continued pharmacy support and LCSW support . Evaluation of upcoming appointments: The patient states that he does not have any to see pcp but knows to call for changes. The patient also states that he sees his cardiologist regularly. He will get the place on his nose checked out as it is likely some kind of cancer. Reminded the patient the importance of health and well being, especially since he has his new granddaughter there living with him now.   Patient Self Care Activities related to Atrial Fibrillation, CHF, HLD, Anxiety, and High fall risk :  . Patient is unable to independently self-manage chronic health conditions  Please see past updates related to this goal by clicking on the "Past Updates" button in the selected goal      .  RNCM: Keep or Improve  My Strength-Stroke        Timeframe:  Long-Range Goal Priority:  Medium Start Date:                             Expected End Date:                       Follow Up Date 03-12-2021   - eat healthy to increase strength - increase activity or exercise time a little every week - know who to call for help if I fall - learn how to get up if I fall - plan activity for times when energy is the highest    Why is this important?    Before the stroke you probably did not think much about being safe when you are up and about.   Now, it may be harder for you to get around.   It may also be easier for you to trip or fall.   It is common to have muscle weakness after a stroke. You may also feel like you cannot control an arm or leg.   It will be helpful to work with a physical therapist to get your strength and muscle control back.    It is good to stay as active as you can. Walking and stretching help you stay strong and flexible.   The physical therapist will develop an exercise program just for you.     Notes: Is concerned about not being able to walk in the near future     .  RNCM: Manage Fatigue (Stroke)        Timeframe:  Short-Term Goal Priority:  Medium Start Date:                             Expected End Date:                       Follow Up Date 03-12-2021   - balance periods of rest with activity as needed - develop a new routine to improve sleep - eat healthy - get at least 7 to 8 hours of sleep at night - get outdoors every day (weather permitting) - limit daytime naps - practice relaxation or meditation daily - use a fan or white noise in bedroom - use devices that will help like a cane, sock-puller or reacher    Why is this important?    Many people have problems with extreme tiredness after a stroke. This is called poststroke fatigue.   Poststroke fatigue is not the same as the usual tiredness.   It does not always improve with rest.   It is not related to how busy or active you have been.   Many things can help you to manage it.        [name]  RNCM: Track and Manage My Symptoms-Depression        Timeframe:  Short-Term Goal Priority:  High Start Date:                             Expected End Date:                       Follow Up Date 03-12-2021   - avoid negative self-talk - develop a personal safety plan - develop a plan  to deal with triggers like holidays, anniversaries - exercise at least 2 to 3 times per week - have a plan for how to handle bad days - journal feelings and what helps to feel better or worse - spend time or talk with others at least 2 to 3 times per week - spend time or talk with others every day - watch for early signs of feeling worse - write in journal every day    Why is this important?    Keeping track of your progress will help your treatment team find  the right mix of medicine and therapy for you.   Write in your journal every day.   Day-to-day changes in depression symptoms are normal. It may be more helpful to check your progress at the end of each week instead of every day.     Notes: Discussed writing down goals to accomplish and taking small steps to meet those goals        Patient verbalizes understanding of instructions provided today and agrees to view in [place].   Telephone follow up appointment with care management team member scheduled for: 03-12-2021 at 230 pm  Noreene Larsson RN, MSN, [place] Mobile: [phone]

## 2021-01-15 NOTE — Chronic Care Management (AMB) (Signed)
Chronic Care Management   CCM RN Visit Note  01/15/2021 Name: Michael Delacruz MRN: [id number] DOB: 11-Feb-1941  Subjective: Michael Delacruz is a 80 y.o. year old male who is a primary care patient of [name], DO. The care management team was consulted for assistance with disease management and care coordination needs.    Engaged with patient by telephone for follow up visit in response to provider referral for case management and/or care coordination services.   Consent to Services:  The patient was given information about Chronic Care Management services, agreed to services, and gave verbal consent prior to initiation of services.  Please see initial visit note for detailed documentation.   Patient agreed to services and verbal consent obtained.   Assessment: Review of patient past medical history, allergies, medications, health status, including review of consultants reports, laboratory and other test data, was performed as part of comprehensive evaluation and provision of chronic care management services.   SDOH (Social Determinants of Health) assessments and interventions performed:    CCM Care Plan  Allergies  Allergen Reactions   Codeine Nausea Only   Digoxin And Related     SOB, bad dreams   Macrodantin [Nitrofurantoin Macrocrystal] Rash   Morphine And Related Rash    Outpatient Encounter Medications as of 01/15/2021  Medication Sig   ALPRAZolam (XANAX) 0.25 MG tablet Take 1 tablet (0.25 mg total) by mouth 2 (two) times daily as needed for anxiety.   furosemide (LASIX) 40 MG tablet TAKE 1 TABLET BY MOUTH TWICE A DAY AS NEEDED   Homeopathic Products (HOMEOPATHIC CALM SL) Place under the tongue. Is using Doterra essential oil of Onguard ingestion daily   No facility-administered encounter medications on file as of 01/15/2021.    Patient Active Problem List   Diagnosis Date Noted   Generalized anxiety disorder 05/04/2018   Sleep apnea,  obstructive 05/04/2018   History of stroke 05/04/2018   Retinal hemorrhage, left eye 05/04/2018   Late effects of cerebral ischemic stroke 05/04/2018   Hypothyroidism 05/04/2018   Permanent atrial fibrillation ([place]) 05/02/2018   Tachycardia induced cardiomyopathy ([place]) 05/02/2018   Hypokalemia 05/02/2018   Constipation 04/25/2018   Insomnia due to medical condition 02/22/2018   Depression, major, recurrent, moderate ([place]) 01/16/2018   Somatic symptom disorder 01/16/2018   Vision loss 12/31/2015   TIA (transient ischemic attack) 12/06/2015   S/P MVR (mitral valve repair) 11/11/2015   S/P Minimally invasive maze operation for atrial fibrillation 21/30/8657   Systolic CHF, chronic (HCC)    Atrial fibrillation with RVR ([place])    Centrilobular emphysema ([place])    Hypotension 11/02/2012   Stress at home 11/02/2012   Smoking 04/27/2012   Hyperlipidemia 04/27/2012   CAD (coronary artery disease) 04/27/2012   Heart palpitations 09/03/2011   Dizziness 05/19/2011   MVP (mitral valve prolapse) 05/05/2011   Mitral valve regurgitation 05/05/2011   Pulmonary HTN ([place]) 05/05/2011    Conditions to be addressed/monitored:CHF, Anxiety, Depression and stroke  Care Plan : RNCM: Heart Failure (Adult)  Updates made by Vanita Ingles since 01/15/2021 12:00 AM    Problem: RNCM: Symptom Exacerbation (Heart Failure)   Priority: Medium    Goal: RNCM: Symptom Exacerbation Prevented or Minimized (HF)   Priority: Medium  Note:   Current Barriers:   Knowledge deficits related to basic heart failure pathophysiology and self care management  Unable to independently manage HF   Unable to self administer medications as prescribed  Lacks social connections  Does not contact  provider office for questions/concerns  Lack of scale in home- has scales, does not consistently use   Financial strain  Nurse Case Manager Clinical Goal(s):   Over the next 120 days, patient will  weigh self daily and record  Over the next 120 days, patient will verbalize understanding of Heart Failure Action Plan and when to call doctor  Over the next 120 days, patient will take all Heart Failure mediations as prescribed Interventions:   Collaboration with [name], DO regarding development and update of comprehensive plan of care as evidenced by provider attestation and co-signature  Inter-disciplinary care team collaboration (see longitudinal plan of care)  Basic overview and discussion of pathophysiology of Heart Failure  Provided written and verbal education on low sodium diet  Reviewed Heart Failure Action Plan in depth and provided written copy  Assessed for scales in home- does not consistently use   Discussed importance of daily weight  Reviewed role of diuretics in prevention of fluid overload  Patient Goals/Self-Care Activities  Over the next 120 days, patient will:  - Take Heart Failure Medications as prescribed - Weigh daily and record (notify MD with 3 lb weight gain over night or 5 lb in a week) - Follow CHF Action Plan - Adhere to low sodium diet - barriers to lifestyle changes reviewed and addressed - barriers to treatment reviewed and addressed - cognitive screening completed and reviewed - depression screen reviewed - health literacy screening completed or reviewed - healthy lifestyle promoted - rescue (action) plan developed - rescue (action) plan reviewed - self-awareness of signs/symptoms of worsening disease encouraged Follow Up Plan: Telephone follow up appointment with care management team member scheduled for: 03-12-2021 at 2:30 pm   Task: RNCM: Identify and Minimize Risk of Heart Failure Exacerbation   Note:   Care Management Activities:    - barriers to lifestyle changes reviewed and addressed - barriers to treatment reviewed and addressed - cognitive screening completed and reviewed - depression screen reviewed -  health literacy screening completed or reviewed - healthy lifestyle promoted - rescue (action) plan developed - rescue (action) plan reviewed - self-awareness of signs/symptoms of worsening disease encouraged       Care Plan : RNCM: Stroke (Adult)  Updates made by Vanita Ingles since 01/15/2021 12:00 AM    Problem: RNCM: Emotional Adjustment to Disease (Stroke)   Priority: Medium    Long-Range Goal: RNCM: Optimal Coping   Priority: Medium  Note:   Current Barriers:   Care Coordination needs related to help in the community and resources  in a patient with history of CVA  Chronic Disease Management support and education needs related to post CVA care   Lacks caregiver support.   Film/video editor.   Non-adherence to scheduled provider appointments  Non-adherence to prescribed medication regimen  Unable to independently manage health post CVA  Unable to self administer medications as prescribed  Does not adhere to prescribed medication regimen  Lacks social connections  Unable to perform IADLs independently  Does not maintain contact with provider office  Does not contact provider office for questions/concerns  Nurse Case Manager Clinical Goal(s):   Over the next 120 days, patient will verbalize understanding of plan for effective management of post stroke care  Over the next 120 days, patient will work with [facility], CCM team, and pcp to address needs related to management of chronic condtions   Over the next 120 days, patient will demonstrate improved adherence to prescribed treatment plan for CVA  as evidenced bycompliance with heart healthy diet, taking medications as prescribed, and working with the CCM team to optimize health and well being.   Over the next 120 days, patient will verbalize basic understanding of CVA disease process and self health management plan as evidenced by finding new hobbies to help the patient have pleasure and things to look forward to  in his life  Interventions:   1:1 collaboration with Smitty Cords, DO regarding development and update of comprehensive plan of care as evidenced by provider attestation and co-signature  Inter-disciplinary care team collaboration (see longitudinal plan of care)  Evaluation of current treatment plan related to post stroke and patient's adherence to plan as established by provider.  Advised patient to set small goals to work toward. The patient has a lot of depression and anxiety over the loss of ability to do things he use to do because of CVA. Also has lost vision in his right eye and he can no longer read and enjoy reading. Also can not shoot pool like he did before have CVA.  The patient feels lost and states he is "living in the past".  Provided education to patient re: alternate activities to enhance mood. Working on strengthen exercises. Options for help in mobility and safety.  Collaborated with pcp regarding patient states he still has the infection in his nose and would like to know about an ENT consult from pcp to either [place] or [place], not the [place] area. Will sent and in basket message to pcp and inquire   Discussed plans with patient for ongoing care management follow up and provided patient with direct contact information for care management team  Patient Goals/Self-Care Activities Over the next 120 days, patient will:  - Patient will self administer medications as prescribed Patient will attend all scheduled provider appointments Patient will call pharmacy for medication refills Patient will attend church or other social activities Patient will continue to perform IADL's independently Patient will call provider office for new concerns or questions Patient will work with BSW to address care coordination needs and will continue to work with the clinical team to address health care and disease management related needs.    - counseling provided -  decision-making supported - depression screen reviewed - goal-setting facilitated - positive reinforcement provided - problem-solving facilitated - relaxation techniques promoted - self-care encouraged - self-reflection promoted - verbalization of feelings encouraged Follow Up Plan: Telephone follow up appointment with care management team member scheduled for: 03-12-2021 at 230 pm       Task: RNCM: Support Psychosocial Response to Stroke   Note:   Care Management Activities:    - counseling provided - decision-making supported - depression screen reviewed - goal-setting facilitated - positive reinforcement provided - problem-solving facilitated - relaxation techniques promoted - self-care encouraged - self-reflection promoted - verbalization of feelings encouraged     Care Plan : RNCM: Depression (Adult)  Updates made by Marlowe Sax since 01/15/2021 12:00 AM    Problem: RNCM: Depression Identification (Depression)   Priority: High    Goal: RNCM: Depressive Symptoms Identified   Priority: High  Note:   Current Barriers:   Knowledge Deficits related to effective ways of decreasing anxiety and depression   Care Coordination needs related to resources and support  in a patient with depression as a result of loss of independence and unable to do the things he use to do like reading and shooting pool   Chronic Disease Management support and education  needs related to management of depression   Lacks caregiver support.   Film/video editor.   Non-adherence to scheduled provider appointments  Non-adherence to prescribed medication regimen  Unable to independently manage depression and anxiety   Does not adhere to prescribed medication regimen  Does not adhere to prescribed psychotropic medication regimen  Lacks social connections  Unable to perform IADLs independently  Does not maintain contact with provider office  Does not contact provider office for  questions/concerns  Nurse Case Manager Clinical Goal(s):   Over the next 120 days, patient will verbalize understanding of plan for effective management of depression   Over the next 120 days, patient will work with [facility], CCM team and pcp to address needs related to depression and patient stating his depression is "bad"  Over the next 120 days, patient will work with CM clinical social worker to assist with depression and anxiety support and education   Over the next 120 days, the patient will demonstrate ongoing self health care management ability as evidenced by coping with chronic conditions and depression more effectively and finding new hobbies that he enjoys doing and can do independently *  Interventions:   1:1 collaboration with [name], DO regarding development and update of comprehensive plan of care as evidenced by provider attestation and co-signature  Inter-disciplinary care team collaboration (see longitudinal plan of care)  Evaluation of current treatment plan related to depression  and patient's adherence to plan as established by provider.  Advised patient to call the office for worsening mood, anxiety, depression   Provided education to patient re: alternate ways to deal with depression and anxiety, safety, concerns about depressed state  Reviewed medications with patient and discussed the patient does not take anything for depression or anxiety. Discussed medication could help with his depressed symptoms. The patient is not open to taking medications at this time to help with depression .  Collaborated with pcp and LCSW  regarding increased depression and ideas or suggestions to help with effective management of depression   Provided patient with depression educational materials related to effective management and alternative therapies   Social Work referral for depression help and recommendations   Discussed plans with patient for ongoing care  management follow up and provided patient with direct contact information for care management team  Patient Goals/Self-Care Activities Over the next 120 days, patient will:  - Patient will self administer medications as prescribed Patient will attend all scheduled provider appointments Patient will call pharmacy for medication refills Patient will continue to perform IADL's independently Patient will call provider office for new concerns or questions Patient will work with BSW to address care coordination needs and will continue to work with the clinical team to address health care and disease management related needs.   - anxiety screen reviewed - depression screen reviewed - medication list reviewed - participation in psychiatric services encouraged  Follow Up Plan: Telephone follow up appointment with care management team member scheduled for:03-12-2021 at 230 pm         Task: RNCM Identify Depressive Symptoms and Facilitate Treatment   Note:   Care Management Activities:    - anxiety screen reviewed - depression screen reviewed - medication list reviewed - participation in psychiatric services encouraged         Plan:Telephone follow up appointment with care management team member scheduled for:  03-12-2021 at 230 pm  [place], MSN, [place]  [facility] Mobile: [phone]

## 2021-01-16 ENCOUNTER — Other Ambulatory Visit: Payer: Self-pay | Admitting: Family Medicine

## 2021-01-16 DIAGNOSIS — J3489 Other specified disorders of nose and nasal sinuses: Secondary | ICD-10-CM

## 2021-01-27 ENCOUNTER — Telehealth: Payer: Self-pay

## 2021-01-27 ENCOUNTER — Ambulatory Visit (INDEPENDENT_AMBULATORY_CARE_PROVIDER_SITE_OTHER): Payer: Medicare HMO

## 2021-01-27 DIAGNOSIS — I4821 Permanent atrial fibrillation: Secondary | ICD-10-CM

## 2021-01-27 LAB — CUP PACEART REMOTE DEVICE CHECK
Battery Remaining Longevity: 90 mo
Battery Remaining Percentage: 95.5 %
Battery Voltage: 2.99 V
Date Time Interrogation Session: 20220208023048
Implantable Lead Implant Date: 20190515
Implantable Lead Implant Date: 20190515
Implantable Lead Location: 753858
Implantable Lead Location: 753860
Implantable Lead Model: 5076
Implantable Pulse Generator Implant Date: 20190515
Lead Channel Impedance Value: 540 Ohm
Lead Channel Impedance Value: 540 Ohm
Lead Channel Pacing Threshold Amplitude: 0.5 V
Lead Channel Pacing Threshold Amplitude: 0.75 V
Lead Channel Pacing Threshold Pulse Width: 0.5 ms
Lead Channel Pacing Threshold Pulse Width: 0.5 ms
Lead Channel Sensing Intrinsic Amplitude: 8.4 mV
Lead Channel Setting Pacing Amplitude: 2 V
Lead Channel Setting Pacing Amplitude: 2.5 V
Lead Channel Setting Pacing Pulse Width: 0.5 ms
Lead Channel Setting Pacing Pulse Width: 0.5 ms
Lead Channel Setting Sensing Sensitivity: 2 mV
Pulse Gen Model: 3562
Pulse Gen Serial Number: 9427031

## 2021-01-27 NOTE — Telephone Encounter (Signed)
Michael Delacruz  he is hooked up with you yet,  if not would you be willing to do so and reach out to him. Thanks SK

## 2021-01-27 NOTE — Telephone Encounter (Addendum)
Alert from[facility] shows recent fluid retention with significant drop in daily impedance.   Spoke with pt who requested that I speak with his wife.  She reports that patient has been ill with congestion, she reports it is improving however.    Advised that PM report shows possible fluid retention.  Per her report pt has not been taking PRN lasix.  Recommended pt take prescribed dose of 40mg  BID for next 3 days with close monitoring.  If condition does not improve call office.  ED precautions reviewed.

## 2021-01-28 ENCOUNTER — Ambulatory Visit: Payer: Self-pay | Admitting: General Practice

## 2021-01-28 DIAGNOSIS — I5032 Chronic diastolic (congestive) heart failure: Secondary | ICD-10-CM

## 2021-01-28 DIAGNOSIS — F331 Major depressive disorder, recurrent, moderate: Secondary | ICD-10-CM

## 2021-01-28 DIAGNOSIS — F411 Generalized anxiety disorder: Secondary | ICD-10-CM

## 2021-01-28 NOTE — Patient Instructions (Signed)
Visit Information  PATIENT GOALS: Goals Addressed            This Visit's Progress   . RNCM: Track and Manage Fluids and Swelling-Heart Failure       Timeframe:  Short-Term Goal Priority:  High Start Date:         01-28-2021                    Expected End Date:     03-12-2021                  Follow Up Date: 03-12-2021 or before   - call office if I gain more than 2 pounds in one day or 5 pounds in one week - keep legs up while sitting - use salt in moderation - watch for swelling in feet, ankles and legs every day - weigh myself daily    Why is this important?    It is important to check your weight daily and watch how much salt and liquids you have.   It will help you to manage your heart failure.    Notes: 01-28-2021: Patient is currently having an exacerbation in his condition with HF. Cardiologist instructed to double Lasix dose        Patient verbalizes understanding of instructions provided today and agrees to view in [place].   Telephone follow up appointment with care management team member scheduled for: 03-12-2021 at 2:30 pm and prn  Noreene Larsson RN, MSN, [place] Mobile: [phone]

## 2021-01-28 NOTE — Chronic Care Management (AMB) (Signed)
Chronic Care Management   CCM RN Visit Note  01/28/2021 Name: Michael Delacruz MRN: [id number] DOB: 1941/10/14  Subjective: Michael Delacruz is a 80 y.o. year old male who is a primary care patient of [name], DO. The care management team was consulted for assistance with disease management and care coordination needs. Spoke to the patients wife Proberta with patient by telephone for request for call back, patient having an exacerbation of CHF in response to provider referral for case management and/or care coordination services.   Consent to Services:  The patient was given information about Chronic Care Management services, agreed to services, and gave verbal consent prior to initiation of services.  Please see initial visit note for detailed documentation.   Patient agreed to services and verbal consent obtained.   Assessment: Review of patient past medical history, allergies, medications, health status, including review of consultants reports, laboratory and other test data, was performed as part of comprehensive evaluation and provision of chronic care management services.   SDOH (Social Determinants of Health) assessments and interventions performed:    CCM Care Plan  Allergies  Allergen Reactions   Codeine Nausea Only   Digoxin And Related     SOB, bad dreams   Macrodantin [Nitrofurantoin Macrocrystal] Rash   Morphine And Related Rash    Outpatient Encounter Medications as of 01/28/2021  Medication Sig   ALPRAZolam (XANAX) 0.25 MG tablet Take 1 tablet (0.25 mg total) by mouth 2 (two) times daily as needed for anxiety.   furosemide (LASIX) 40 MG tablet TAKE 1 TABLET BY MOUTH TWICE A DAY AS NEEDED   Homeopathic Products (HOMEOPATHIC CALM SL) Place under the tongue. Is using Doterra essential oil of Onguard ingestion daily   No facility-administered encounter medications on file as of 01/28/2021.    Patient Active Problem List    Diagnosis Date Noted   Generalized anxiety disorder 05/04/2018   Sleep apnea, obstructive 05/04/2018   History of stroke 05/04/2018   Retinal hemorrhage, left eye 05/04/2018   Late effects of cerebral ischemic stroke 05/04/2018   Hypothyroidism 05/04/2018   Permanent atrial fibrillation ([place]) 05/02/2018   Tachycardia induced cardiomyopathy ([place]) 05/02/2018   Hypokalemia 05/02/2018   Constipation 04/25/2018   Insomnia due to medical condition 02/22/2018   Depression, major, recurrent, moderate ([place]) 01/16/2018   Somatic symptom disorder 01/16/2018   Vision loss 12/31/2015   TIA (transient ischemic attack) 12/06/2015   S/P MVR (mitral valve repair) 11/11/2015   S/P Minimally invasive maze operation for atrial fibrillation 76/16/0737   Systolic CHF, chronic (HCC)    Atrial fibrillation with RVR ([place])    Centrilobular emphysema ([place])    Hypotension 11/02/2012   Stress at home 11/02/2012   Smoking 04/27/2012   Hyperlipidemia 04/27/2012   CAD (coronary artery disease) 04/27/2012   Heart palpitations 09/03/2011   Dizziness 05/19/2011   MVP (mitral valve prolapse) 05/05/2011   Mitral valve regurgitation 05/05/2011   Pulmonary HTN ([place]) 05/05/2011    Conditions to be addressed/monitored:CHF, Anxiety and Depression  Care Plan : RNCM: Heart Failure (Adult)  Updates made by Vanita Ingles since 01/28/2021 12:00 AM    Problem: RNCM: Symptom Exacerbation (Heart Failure)   Priority: Medium    Goal: RNCM: Symptom Exacerbation Prevented or Minimized (HF)   Priority: Medium  Note:   Current Barriers:   Knowledge deficits related to basic heart failure pathophysiology and self care management  Unable to independently manage HF   Unable to self  administer medications as prescribed  Lacks social connections  Does not contact provider office for questions/concerns  Lack of scale in home- has scales, does not consistently use   Financial strain  Nurse  Case Manager Clinical Goal(s):   Over the next 120 days, patient will weigh self daily and record  Over the next 120 days, patient will verbalize understanding of Heart Failure Action Plan and when to call doctor  Over the next 120 days, patient will take all Heart Failure mediations as prescribed Interventions:   Collaboration with [name], DO regarding development and update of comprehensive plan of care as evidenced by provider attestation and co-signature  Inter-disciplinary care team collaboration (see longitudinal plan of care)  Basic overview and discussion of pathophysiology of Heart Failure  Provided written and verbal education on low sodium diet  Reviewed Heart Failure Action Plan in depth and provided written copy. 01-28-2021: The patients wife states the cardiologist called yesterday as his heart monitoring system alerted the patient that he was having an exacerbation in his HF. Advised the patient to double up on his Lasix and seek 911 help for worsening condition. Call by the patient to the [facility] asking for assistance. Spoke with the patients wife. The patient is using saline nasal spray to open up sinus congestion but wanted to know if there was something stronger the MD could order. Is open to an appointment with pcp. Explained to the wife a message would be sent to the provider for recommendations but if the shortness of breath was being created by exacerbation of heart failure then he would likely need to be evaluated in ER and admitted to have IV Lasix. PO lasix increased yesterday and the patient has been going to the bathroom. The RNCM advised the patients wife to take the patient to urgent care or ER for worsening sx and sx of condition.  The wife verbalized understanding.   Assessed for scales in home- does not consistently use   Discussed importance of daily weight  Reviewed role of diuretics in prevention of fluid overload- 01-28-2021: The patient is  currently taking extra dose of lasix per cardiology for exacerbation in his CHF.   Collaboration with pcp, pharm D, and clinical staff for assistance with request for something to help with opening up sinus passage to be able to breath better.  Patient Goals/Self-Care Activities  Over the next 120 days, patient will:  - Take Heart Failure Medications as prescribed - Weigh daily and record (notify MD with 3 lb weight gain over night or 5 lb in a week) - Follow CHF Action Plan - Adhere to low sodium diet - barriers to lifestyle changes reviewed and addressed - barriers to treatment reviewed and addressed - cognitive screening completed and reviewed - depression screen reviewed - health literacy screening completed or reviewed - healthy lifestyle promoted - rescue (action) plan developed - rescue (action) plan reviewed - self-awareness of signs/symptoms of worsening disease encouraged Follow Up Plan: Telephone follow up appointment with care management team member scheduled for: 03-12-2021 at 2:30 pm   Care Plan : RNCM: Depression (Adult)  Updates made by Vanita Ingles since 01/28/2021 12:00 AM    Problem: RNCM: Depression Identification (Depression)   Priority: High    Goal: RNCM: Depressive Symptoms Identified   Priority: High  Note:   Current Barriers:   Knowledge Deficits related to effective ways of decreasing anxiety and depression   Care Coordination needs related to resources and support  in  a patient with depression as a result of loss of independence and unable to do the things he use to do like reading and shooting pool   Chronic Disease Management support and education needs related to management of depression   Lacks caregiver support.   Film/video editor.   Non-adherence to scheduled provider appointments  Non-adherence to prescribed medication regimen  Unable to independently manage depression and anxiety   Does not adhere to prescribed medication  regimen  Does not adhere to prescribed psychotropic medication regimen  Lacks social connections  Unable to perform IADLs independently  Does not maintain contact with provider office  Does not contact provider office for questions/concerns  Nurse Case Manager Clinical Goal(s):   Over the next 120 days, patient will verbalize understanding of plan for effective management of depression   Over the next 120 days, patient will work with [facility], CCM team and pcp to address needs related to depression and patient stating his depression is "bad"  Over the next 120 days, patient will work with CM clinical social worker to assist with depression and anxiety support and education   Over the next 120 days, the patient will demonstrate ongoing self health care management ability as evidenced by coping with chronic conditions and depression more effectively and finding new hobbies that he enjoys doing and can do independently *  Interventions:   1:1 collaboration with [name], DO regarding development and update of comprehensive plan of care as evidenced by provider attestation and co-signature  Inter-disciplinary care team collaboration (see longitudinal plan of care)  Evaluation of current treatment plan related to depression  and patient's adherence to plan as established by provider.  Advised patient to call the office for worsening mood, anxiety, depression. 01-28-2021: The patient is having an exacerbation in his HF at this time that is also causing increased anxiety and depression. Collaboration with pcp and CCM team.   Provided education to patient re: alternate ways to deal with depression and anxiety, safety, concerns about depressed state  Reviewed medications with patient and discussed the patient does not take anything for depression or anxiety. Discussed medication could help with his depressed symptoms. The patient is not open to taking medications at this time to  help with depression .  Collaborated with pcp and LCSW  regarding increased depression and ideas or suggestions to help with effective management of depression   Provided patient with depression educational materials related to effective management and alternative therapies   Social Work referral for depression help and recommendations   Discussed plans with patient for ongoing care management follow up and provided patient with direct contact information for care management team  Patient Goals/Self-Care Activities Over the next 120 days, patient will:  - Patient will self administer medications as prescribed Patient will attend all scheduled provider appointments Patient will call pharmacy for medication refills Patient will continue to perform IADL's independently Patient will call provider office for new concerns or questions Patient will work with BSW to address care coordination needs and will continue to work with the clinical team to address health care and disease management related needs.   - anxiety screen reviewed - depression screen reviewed - medication list reviewed - participation in psychiatric services encouraged  Follow Up Plan: Telephone follow up appointment with care management team member scheduled for:03-12-2021 at 230 pm           Plan:Telephone follow up appointment with care management team member scheduled for:  03-12-2021 at 230  pm or before as needed   [place], MSN, [facility] [place] Mobile: [phone]

## 2021-01-28 NOTE — Progress Notes (Signed)
I called and scheduled the patient for a video visit with Dr. Raliegh Ip on tomorrow to discuss this sinus congestion.

## 2021-01-29 ENCOUNTER — Telehealth (INDEPENDENT_AMBULATORY_CARE_PROVIDER_SITE_OTHER): Payer: Medicare HMO | Admitting: Family Medicine

## 2021-01-29 ENCOUNTER — Encounter: Payer: Self-pay | Admitting: Family Medicine

## 2021-01-29 ENCOUNTER — Other Ambulatory Visit: Payer: Self-pay

## 2021-01-29 VITALS — Wt 195.0 lb

## 2021-01-29 DIAGNOSIS — U071 COVID-19: Secondary | ICD-10-CM

## 2021-01-29 MED ORDER — HYDROXYZINE HCL 25 MG PO TABS: 25.0000 mg | ORAL_TABLET | Freq: Every evening | ORAL | 0 refills | Status: DC | PRN

## 2021-01-29 MED ORDER — PREDNISONE 10 MG PO TABS: ORAL_TABLET | ORAL | 0 refills | Status: DC

## 2021-01-29 MED ORDER — IPRATROPIUM BROMIDE 0.06 % NA SOLN: 2.0000 | Freq: Four times a day (QID) | NASAL | 0 refills | Status: DC

## 2021-01-29 NOTE — Progress Notes (Signed)
Subjective:    Patient ID: Michael Delacruz, male    DOB: 03-11-1941, 80 y.o.   MRN: [id number]  Michael Delacruz is a 80 y.o. male presenting on 01/29/2021 for Shortness of Breath, Cough, and Diarrhea  Virtual / Telehealth Encounter - Telephone The purpose of this virtual visit is to provide medical care while limiting exposure to the novel coronavirus (COVID19) for both patient and office staff.  Consent was obtained for remote visit:  Yes.   Answered questions that patient had about telehealth interaction:  Yes.   I discussed the limitations, risks, security and privacy concerns of performing an evaluation and management service by video/telephone. I also discussed with the patient that there may be a patient responsible charge related to this service. The patient expressed understanding and agreed to proceed.  Patient Location: Home Provider Location: [facility] (Office)  Participants in virtual visit: - Patient: Michael Delacruz - Wife Michael Delacruz on phone. - CMA: Frederich Cha, CMA - Provider: Dr Parks Ranger   HPI  COVID19 Everyone in house hold had [place] within past 1-2 weeks. He had very difficult time keeping PO intake due to loss of his taste and smell, he lost weight, and he had issue with fall and pulled muscle in shoulder. He was taking Tylenol Arthritis with some improvement for shoulder pain. He lost some weight He was advised congestion and fluid around his heart recently by his Cardiologist, they are doing Lasix medication. He has worsening difficulty with nasal and sinus congestion and difficulty breathing through nose He has Coricidin OTC for chest congestion and cough.  Unvaccinated to [place]  Denies chest pain or acute dyspnea  Depression screen [facility] 2/9 12/23/2020 06/09/2020 02/21/2020  Decreased Interest 0 1 2  Down, Depressed, Hopeless 3 2 3   PHQ - 2 Score 3 3 5   Altered sleeping 0 2 2  Tired, decreased energy 3 2 3   Change in appetite 0 3  3  Feeling bad or failure about yourself  0 2 2  Trouble concentrating 2 0 0  Moving slowly or fidgety/restless 0 0 0  Suicidal thoughts 0 0 0  PHQ-9 Score 8 12 15   Difficult doing work/chores Somewhat difficult Not difficult at all Not difficult at all  Some recent data might be hidden    Social History   Tobacco Use  . Smoking status: Current Every Day Smoker    Packs/day: 0.50    Years: 52.00    Pack years: 26.00    Types: Cigarettes    Last attempt to quit: 02/13/2018    Years since quitting: 2.9  . Smokeless tobacco: Current User  . Tobacco comment: Resumed smoking, after quit 01/2018  Vaping Use  . Vaping Use: Never used  Substance Use Topics  . Alcohol use: Not Currently    Alcohol/week: 1.0 standard drink    Types: 1 Standard drinks or equivalent per week    Comment: occasionally drinks a margarita  . Drug use: No    Review of Systems Per HPI unless specifically indicated above     Objective:    Wt 195 lb (88.5 kg)   BMI 27.98 kg/m   Wt Readings from Last 3 Encounters:  01/29/21 195 lb (88.5 kg)  12/23/20 195 lb (88.5 kg)  11/25/20 200 lb (90.7 kg)    Physical Exam   Telephone visit. No exam.  Results for orders placed or performed in visit on 01/27/21  CUP [place] CHECK  Result Value  Ref Range   Date Time Interrogation Session [id number]    Pulse Generator Manufacturer [employer]    Pulse Gen Model 3562 Quadra Allure MP    Pulse Gen Serial Number [id number]    Clinic Name [facility]    Implantable Pulse Generator Type Cardiac Resynch Therapy Pacemaker    Implantable Pulse Generator Implant Date [id number]    Implantable Lead Manufacturer [facility]    Implantable Lead Model 5076 CapSureFix Novus    Implantable Lead Serial Number [id number]    Implantable Lead Implant Date [id number]    Implantable Lead Location Detail 1 UNKNOWN    Implantable Lead Location [id number]    Implantable Lead Manufacturer [facility]    Implantable Lead Model 1458Q Quartet     Implantable Lead Serial Number [id number]    Implantable Lead Implant Date [id number]    Implantable Lead Location Detail 1 UNKNOWN    Implantable Lead Location [phone]    Lead Channel Setting Sensing Sensitivity 2.0 mV   Lead Channel Setting Sensing Adaptation Mode Fixed Pacing    Lead Channel Setting Pacing Pulse Width 0.5 ms   Lead Channel Setting Pacing Amplitude 2.5 V   Lead Channel Setting Pacing Pulse Width 0.5 ms   Lead Channel Setting Pacing Amplitude 2.0 V   Lead Channel Setting Pacing Capture Mode Fixed Pacing    Lead Channel Status NULL    Lead Channel Impedance Value 540 ohm   Lead Channel Pacing Threshold Amplitude 0.5 V   Lead Channel Pacing Threshold Pulse Width 0.5 ms   Lead Channel Status NULL    Lead Channel Impedance Value 540 ohm   Lead Channel Sensing Intrinsic Amplitude 8.4 mV   Lead Channel Pacing Threshold Amplitude 0.75 V   Lead Channel Pacing Threshold Pulse Width 0.5 ms   Battery Status MOS    Battery Remaining Longevity 90 mo   Battery Remaining Percentage 95.5 %   Battery Voltage 2.99 V      Assessment & Plan:   Problem List Items Addressed This Visit   None   Visit Diagnoses    COVID-19 virus infection    -  Primary   Relevant Medications   hydrOXYzine (ATARAX/VISTARIL) 25 MG tablet   ipratropium (ATROVENT) 0.06 % nasal spray   predniSONE (DELTASONE) 10 MG tablet     COVID19 Recent infection 1-2 weeks Persistent symptoms post-COVID Improved breathing, does have some reported chest congestion / fluid - on diuresis History of COPD emphysema Persistent sinus congestion Use OTC meds as currently using to provide relief Add Start Atrovent nasal spray decongestant 2 sprays in each nostril up to 4 times daily for 7 days May trial Afrin OTC SHORT TERM ONLY 1 dose at night for 2-3 nights to help breathe and sleep Add Hydroxyzine PRN nightly to help sleep Can double dose Xanax at night for a few nights if need Last option if need for breathing /  prednisone taper as discussed F/u protocol reviewed. When to seek care if not improving.   Meds ordered this encounter  Medications  . hydrOXYzine (ATARAX/VISTARIL) 25 MG tablet    Sig: Take 1 tablet (25 mg total) by mouth at bedtime as needed for anxiety (sleep).    Dispense:  30 tablet    Refill:  0  . ipratropium (ATROVENT) 0.06 % nasal spray    Sig: Place 2 sprays into both nostrils 4 (four) times daily. For up to 5-7 days then stop.    Dispense:  15 mL    Refill:  0  .  predniSONE (DELTASONE) 10 MG tablet    Sig: Take 6 tabs with breakfast Day 1, 5 tabs Day 2, 4 tabs Day 3, 3 tabs Day 4, 2 tabs Day 5, 1 tab Day 6.    Dispense:  21 tablet    Refill:  0      Follow up plan: Return if symptoms worsen or fail to improve.   Patient verbalizes understanding with the above medical recommendations including the limitation of remote medical advice.  Specific follow-up and call-back criteria were given for patient to follow-up or seek medical care more urgently if needed.  Total duration of direct patient care provided via telephone: 11 minutes   Nobie Putnam, [facility] 01/29/2021, 8:58 AM

## 2021-01-29 NOTE — Patient Instructions (Signed)
Add Start Atrovent nasal spray decongestant 2 sprays in each nostril up to 4 times daily for 7 days May trial Afrin OTC SHORT TERM ONLY 1 dose at night for 2-3 nights to help breathe and sleep Add Hydroxyzine PRN nightly to help sleep Can double dose Xanax at night for a few nights if need Last option if need for breathing / prednisone taper as discussed F/u protocol reviewed. When to seek care if not improving.

## 2021-01-30 ENCOUNTER — Emergency Department: Payer: Medicare HMO

## 2021-01-30 ENCOUNTER — Encounter: Payer: Self-pay | Admitting: Radiology

## 2021-01-30 ENCOUNTER — Other Ambulatory Visit: Payer: Self-pay

## 2021-01-30 ENCOUNTER — Inpatient Hospital Stay
Admission: EM | Admit: 2021-01-30 | Discharge: 2021-02-02 | DRG: 071 | Disposition: A | Discharge status: Home Health | Payer: Medicare HMO | Attending: Internal Medicine | Admitting: Internal Medicine

## 2021-01-30 DIAGNOSIS — F411 Generalized anxiety disorder: Secondary | ICD-10-CM | POA: Diagnosis present

## 2021-01-30 DIAGNOSIS — I2510 Atherosclerotic heart disease of native coronary artery without angina pectoris: Secondary | ICD-10-CM | POA: Diagnosis present

## 2021-01-30 DIAGNOSIS — I442 Atrioventricular block, complete: Secondary | ICD-10-CM | POA: Diagnosis present

## 2021-01-30 DIAGNOSIS — I272 Pulmonary hypertension, unspecified: Secondary | ICD-10-CM | POA: Diagnosis present

## 2021-01-30 DIAGNOSIS — F17210 Nicotine dependence, cigarettes, uncomplicated: Secondary | ICD-10-CM | POA: Diagnosis present

## 2021-01-30 DIAGNOSIS — Z20822 Contact with and (suspected) exposure to covid-19: Secondary | ICD-10-CM | POA: Diagnosis present

## 2021-01-30 DIAGNOSIS — E876 Hypokalemia: Secondary | ICD-10-CM | POA: Diagnosis present

## 2021-01-30 DIAGNOSIS — R5381 Other malaise: Secondary | ICD-10-CM | POA: Diagnosis present

## 2021-01-30 DIAGNOSIS — G4733 Obstructive sleep apnea (adult) (pediatric): Secondary | ICD-10-CM | POA: Diagnosis not present

## 2021-01-30 DIAGNOSIS — Z950 Presence of cardiac pacemaker: Secondary | ICD-10-CM | POA: Diagnosis not present

## 2021-01-30 DIAGNOSIS — Z8249 Family history of ischemic heart disease and other diseases of the circulatory system: Secondary | ICD-10-CM

## 2021-01-30 DIAGNOSIS — I5022 Chronic systolic (congestive) heart failure: Secondary | ICD-10-CM | POA: Diagnosis not present

## 2021-01-30 DIAGNOSIS — I4819 Other persistent atrial fibrillation: Secondary | ICD-10-CM | POA: Diagnosis not present

## 2021-01-30 DIAGNOSIS — Z823 Family history of stroke: Secondary | ICD-10-CM

## 2021-01-30 DIAGNOSIS — R627 Adult failure to thrive: Secondary | ICD-10-CM

## 2021-01-30 DIAGNOSIS — K219 Gastro-esophageal reflux disease without esophagitis: Secondary | ICD-10-CM | POA: Diagnosis present

## 2021-01-30 DIAGNOSIS — I509 Heart failure, unspecified: Secondary | ICD-10-CM

## 2021-01-30 DIAGNOSIS — Z6830 Body mass index (BMI) 30.0-30.9, adult: Secondary | ICD-10-CM

## 2021-01-30 DIAGNOSIS — I110 Hypertensive heart disease with heart failure: Secondary | ICD-10-CM | POA: Diagnosis present

## 2021-01-30 DIAGNOSIS — Z8616 Personal history of COVID-19: Secondary | ICD-10-CM | POA: Diagnosis not present

## 2021-01-30 DIAGNOSIS — Z79899 Other long term (current) drug therapy: Secondary | ICD-10-CM

## 2021-01-30 DIAGNOSIS — E039 Hypothyroidism, unspecified: Secondary | ICD-10-CM | POA: Diagnosis present

## 2021-01-30 DIAGNOSIS — R4182 Altered mental status, unspecified: Secondary | ICD-10-CM | POA: Diagnosis not present

## 2021-01-30 DIAGNOSIS — Z952 Presence of prosthetic heart valve: Secondary | ICD-10-CM

## 2021-01-30 DIAGNOSIS — M797 Fibromyalgia: Secondary | ICD-10-CM | POA: Diagnosis present

## 2021-01-30 DIAGNOSIS — R0902 Hypoxemia: Secondary | ICD-10-CM

## 2021-01-30 DIAGNOSIS — G9340 Encephalopathy, unspecified: Secondary | ICD-10-CM | POA: Diagnosis not present

## 2021-01-30 DIAGNOSIS — Z86718 Personal history of other venous thrombosis and embolism: Secondary | ICD-10-CM

## 2021-01-30 DIAGNOSIS — Z043 Encounter for examination and observation following other accident: Secondary | ICD-10-CM | POA: Diagnosis not present

## 2021-01-30 DIAGNOSIS — E785 Hyperlipidemia, unspecified: Secondary | ICD-10-CM | POA: Diagnosis not present

## 2021-01-30 DIAGNOSIS — R531 Weakness: Secondary | ICD-10-CM | POA: Diagnosis not present

## 2021-01-30 DIAGNOSIS — G4700 Insomnia, unspecified: Secondary | ICD-10-CM | POA: Diagnosis present

## 2021-01-30 DIAGNOSIS — Z8673 Personal history of transient ischemic attack (TIA), and cerebral infarction without residual deficits: Secondary | ICD-10-CM | POA: Diagnosis not present

## 2021-01-30 DIAGNOSIS — Z9889 Other specified postprocedural states: Secondary | ICD-10-CM

## 2021-01-30 DIAGNOSIS — J432 Centrilobular emphysema: Secondary | ICD-10-CM | POA: Diagnosis not present

## 2021-01-30 DIAGNOSIS — F32A Depression, unspecified: Secondary | ICD-10-CM | POA: Diagnosis present

## 2021-01-30 DIAGNOSIS — K7689 Other specified diseases of liver: Secondary | ICD-10-CM | POA: Diagnosis present

## 2021-01-30 DIAGNOSIS — S3991XA Unspecified injury of abdomen, initial encounter: Secondary | ICD-10-CM | POA: Diagnosis not present

## 2021-01-30 DIAGNOSIS — Z8679 Personal history of other diseases of the circulatory system: Secondary | ICD-10-CM

## 2021-01-30 DIAGNOSIS — F0390 Unspecified dementia without behavioral disturbance: Secondary | ICD-10-CM | POA: Diagnosis present

## 2021-01-30 LAB — COMPREHENSIVE METABOLIC PANEL
ALT: 73 U/L — ABNORMAL HIGH (ref 0–44)
AST: 99 U/L — ABNORMAL HIGH (ref 15–41)
Albumin: 3.3 g/dL — ABNORMAL LOW (ref 3.5–5.0)
Alkaline Phosphatase: 84 U/L (ref 38–126)
Anion gap: 11 (ref 5–15)
BUN: 14 mg/dL (ref 8–23)
CO2: 27 mmol/L (ref 22–32)
Calcium: 9.4 mg/dL (ref 8.9–10.3)
Chloride: 97 mmol/L — ABNORMAL LOW (ref 98–111)
Creatinine, Ser: 0.98 mg/dL (ref 0.61–1.24)
GFR, Estimated: >60 mL/min (ref >60)
Glucose, Bld: 148 mg/dL — ABNORMAL HIGH (ref 70–99)
Potassium: 3.9 mmol/L (ref 3.5–5.1)
Sodium: 135 mmol/L (ref 135–145)
Total Bilirubin: 1.1 mg/dL (ref 0.3–1.2)
Total Protein: 7.1 g/dL (ref 6.5–8.1)

## 2021-01-30 LAB — URINALYSIS, COMPLETE (UACMP) WITH MICROSCOPIC
Bacteria, UA: NONE SEEN
Bilirubin Urine: NEGATIVE
Glucose, UA: NEGATIVE mg/dL
Hgb urine dipstick: NEGATIVE
Ketones, ur: NEGATIVE mg/dL
Leukocytes,Ua: NEGATIVE
Nitrite: NEGATIVE
Protein, ur: NEGATIVE mg/dL
Specific Gravity, Urine: 1.006 (ref 1.005–1.030)
Squamous Epithelial / HPF: NONE SEEN (ref 0–5)
pH: 6 (ref 5.0–8.0)

## 2021-01-30 LAB — CBC
HCT: 40.7 % (ref 39.0–52.0)
Hemoglobin: 14.1 g/dL (ref 13.0–17.0)
MCH: 30.6 pg (ref 26.0–34.0)
MCHC: 34.6 g/dL (ref 30.0–36.0)
MCV: 88.3 fL (ref 80.0–100.0)
Platelets: 320 10*3/uL (ref 150–400)
RBC: 4.61 MIL/uL (ref 4.22–5.81)
RDW: 12.8 % (ref 11.5–15.5)
WBC: 5.6 10*3/uL (ref 4.0–10.5)
nRBC: 0 % (ref 0.0–0.2)

## 2021-01-30 LAB — RESP PANEL BY RT-PCR (FLU A&B, COVID) ARPGX2
Influenza A by PCR: NEGATIVE
Influenza B by PCR: NEGATIVE
SARS Coronavirus 2 by RT PCR: NEGATIVE

## 2021-01-30 LAB — BRAIN NATRIURETIC PEPTIDE: B Natriuretic Peptide: 242.4 pg/mL — ABNORMAL HIGH (ref 0.0–100.0)

## 2021-01-30 LAB — TROPONIN I (HIGH SENSITIVITY): Troponin I (High Sensitivity): 17 ng/L (ref <18)

## 2021-01-30 MED ORDER — IPRATROPIUM-ALBUTEROL 0.5-2.5 (3) MG/3ML IN SOLN
3.0000 mL | Freq: Once | RESPIRATORY_TRACT | Status: AC
  Administered 2021-01-30: 3 mL via RESPIRATORY_TRACT
  Filled 2021-01-30: qty 3

## 2021-01-30 MED ORDER — IOHEXOL 300 MG/ML  SOLN
100.0000 mL | Freq: Once | INTRAMUSCULAR | Status: AC | PRN
  Administered 2021-01-30: 100 mL via INTRAVENOUS

## 2021-01-30 MED ORDER — FUROSEMIDE 10 MG/ML IJ SOLN
40.0000 mg | Freq: Once | INTRAMUSCULAR | Status: AC
  Administered 2021-01-30: 40 mg via INTRAVENOUS
  Filled 2021-01-30: qty 4

## 2021-01-30 NOTE — ED Notes (Signed)
Patient's O2 noted to decreased into high 70's - low 80's when sleeping. EDP placed patient on 2L O2. O2 sats noted to remain in 90's.

## 2021-01-30 NOTE — ED Triage Notes (Signed)
Wife is with pt. As per wife, pt was going to the bathroom and fell. Wife states pt did not hit his head or have LOC. Wife states no one witnessed the fall though. Wife states since the fall pt has been becoming more altered. Wife states pt also has a pacemaker and was called by her provider that pt needs to go up on his lasix due to congestion around the heart.  Pt believes it is 1942

## 2021-01-30 NOTE — ED Notes (Signed)
Primary RN made aware of pt and EKG shown to Dr. Quentin Cornwall and he was also made aware of pt.

## 2021-01-30 NOTE — H&P (Signed)
History and Physical   Michael Delacruz YOV:[id number] DOB: 1941/08/11 DOA: 01/30/2021  PCP: [name], DO   Patient coming from: Home  Chief Complaint: Fall, confusion  HPI: Michael Delacruz is a 80 y.o. male with medical history significant of CHF, A. fib status post Maze procedure, CAD, mitral valve prolapse status post repair, COPD, pulmonary hypertension, OSA, depression, anxiety, hyperlipidemia, CVA, hypothyroidism, insomnia who presents after having a fall last week and some increasing altered mentation since that time.  Patient did have COVID months ago.  Has had 1 week of difficulty sleeping he states he gets short of breath as he is fall asleep and wakes up agitated.  As above family reports he has had a worsening confusion since his fall a week ago even though he did not hit his head or lose consciousness at that time.  His confusion has manifested itself with restlessness, poor sleep, decreased appetite and episodes of incontinence per the report.   He is also has been directed to increase his Lasix dose by his cardiologist after his pacemaker received data indicating that he may be volume overloaded.  Does have some edema. Denies fevers chills, chest pain, abdominal pain, constipation, diarrhea, nausea, vomiting, cough.  ED Course: Vital signs in the ED significant for blood pressure in the 741O to 878M systolic, respiratory rate in the teens to 20s, saturating in the 90s on room air initially, however he did desaturate in the 70s when he fell asleep, and then improved on 2 L.  Lab work-up showed BMP with chloride 97, glucose 148, LFTs with AST 99 and ALT 73, albumin 3.3.  CBC within normal limits.  BNP mildly elevated to 42.  Troponin negative with repeat pending.  Respiratory panel for flu and Covid negative.  Urinalysis within normal notes.  Imaging studies show chest x-ray without acute abnormality but chronic changes of valve replacement present CT head without acute  abnormality, CT abdomen pelvis shows stable hepatic cysts, 5 mm stone in the right kidney, diverticulosis, prostatomegaly, some degenerative disease of spine.  Given a dose of IV Lasix and DuoNeb in the ED also started on oxygen therapy as above.  Review of Systems: As per HPI otherwise all other systems reviewed and are negative.  Past Medical History:  Diagnosis Date  . Arthritis   . Asthma   . Bradycardia   . CAD in native artery    a. LHC 09/2015: 40% pCx, 35% mRCA.  [name] COPD (chronic obstructive pulmonary disease) ([place])   . Dilation of intestine 01/2015  . Fibromyalgia   . Frequent headaches   . GERD (gastroesophageal reflux disease)   . History of blood clots    eye   . History of hiatal hernia   . History of rheumatic fever   . Hypertension   . Kidney stone   . PAF (paroxysmal atrial fibrillation) ([place])    a. s/p TEE/DCCV 07/2015. b. H/o bleeding on Coumadin when INR >5, changed to Eliquis.  . S/P Minimally invasive maze operation for atrial fibrillation 10/22/2015   Complete bilateral atrial lesion set using cryothermy and bipolar radiofrequency ablation with clipping of LA appendage via right mini thoracotomy approach  . S/P minimally invasive mitral valve repair 10/22/2015   Complex valvuloplasty including triangular resection of posterior leaflet, artificial Gore-tex neochord placement x6 and 38 mm Sorin Memo 3D Rechord ring annuloplasty via right minithoracotomy approach  . Severe mitral regurgitation    a. s/p MV repair 10/2015.  [name] Sleep apnea   .  Stroke ([place])   . Thyroid disorder   . TIA (transient ischemic attack)   . Tobacco abuse     Past Surgical History:  Procedure Laterality Date  . ANKLE SURGERY    . AV NODE ABLATION N/A 05/04/2018   Procedure: AV NODE ABLATION;  Surgeon: Thompson Grayer, MD;  Location: [place] [facility];  Service: Cardiovascular;  Laterality: N/A;  . BIV PACEMAKER INSERTION CRT-P N/A 05/03/2018   Procedure: BIV PACEMAKER INSERTION CRT-P;   Surgeon: Deboraha Sprang, MD;  Location: [place] [facility];  Service: Cardiovascular;  Laterality: N/A;  . CARDIAC CATHETERIZATION N/A 10/03/2015   Procedure: Right and Left Heart Cath and Coronary Angiography;  Surgeon: [name], MD;  Location: [place] [facility];  Service: Cardiovascular;  Laterality: N/A;  . COLONOSCOPY    . ELECTROPHYSIOLOGIC STUDY N/A 08/18/2015   Procedure: CARDIOVERSION;  Surgeon: Wellington Hampshire, MD;  Location: [facility];  Service: Cardiovascular;  Laterality: N/A;  . MINIMALLY INVASIVE MAZE PROCEDURE N/A 10/22/2015   Procedure: MINIMALLY INVASIVE MAZE PROCEDURE;  Surgeon: Rexene Alberts, MD;  Location: [place];  Service: Open Heart Surgery;  Laterality: N/A;  . MITRAL VALVE REPAIR Right 10/22/2015   Procedure: MINIMALLY INVASIVE MITRAL VALVE REPAIR (MVR);  Surgeon: Rexene Alberts, MD;  Location: [place];  Service: Open Heart Surgery;  Laterality: Right;  . SINUS EXPLORATION    . TEE WITHOUT CARDIOVERSION N/A 08/18/2015   Procedure: TRANSESOPHAGEAL ECHOCARDIOGRAM (TEE);  Surgeon: Wellington Hampshire, MD;  Location: [facility];  Service: Cardiovascular;  Laterality: N/A;  . TEE WITHOUT CARDIOVERSION N/A 10/22/2015   Procedure: TRANSESOPHAGEAL ECHOCARDIOGRAM (TEE);  Surgeon: Rexene Alberts, MD;  Location: [place];  Service: Open Heart Surgery;  Laterality: N/A;    Social History  reports that he has been smoking cigarettes. He has a 26.00 pack-year smoking history. He uses smokeless tobacco. He reports previous alcohol use of about 1.0 standard drink of alcohol per week. He reports that he does not use drugs.  Allergies  Allergen Reactions  . Codeine Nausea Only  . Digoxin And Related     SOB, bad dreams  . Macrodantin [Nitrofurantoin Macrocrystal] Rash  . Morphine And Related Rash    Family History  Problem Relation Age of Onset  . Stroke Mother   . Irregular heart beat Mother   . Heart murmur Brother   . Pulmonary embolism Brother   . Hypertension Other   .  Pulmonary embolism Maternal Uncle   Reviewed on admission  Prior to Admission medications   Medication Sig Start Date End Date Taking? Authorizing Provider  ALPRAZolam (XANAX) 0.25 MG tablet Take 1 tablet (0.25 mg total) by mouth 2 (two) times daily as needed for anxiety. 12/23/20  Yes Karamalegos, Devonne Doughty, DO  furosemide (LASIX) 40 MG tablet TAKE 1 TABLET BY MOUTH TWICE A DAY AS NEEDED 11/05/20  Yes Deboraha Sprang, MD  Homeopathic Products (HOMEOPATHIC CALM SL) Place under the tongue. Is using Doterra essential oil of Onguard ingestion daily   Yes [provider]  hydrOXYzine (ATARAX/VISTARIL) 25 MG tablet Take 1 tablet (25 mg total) by mouth at bedtime as needed for anxiety (sleep). 01/29/21  Yes Karamalegos, Jarmal Lewelling J, DO  ipratropium (ATROVENT) 0.06 % nasal spray Place 2 sprays into both nostrils 4 (four) times daily. For up to 5-7 days then stop. 01/29/21  Yes Karamalegos, Devonne Doughty, DO  predniSONE (DELTASONE) 10 MG tablet Take 6 tabs with breakfast Day 1, 5 tabs Day 2,  4 tabs Day 3, 3 tabs Day 4, 2 tabs Day 5, 1 tab Day 6. 01/29/21  Yes [name], DO    Physical Exam: Vitals:   01/30/21 2215 01/30/21 2300 01/30/21 2301 01/30/21 2330  BP: (!) 147/97 (!) 158/88  (!) 133/99  Pulse: 78 79 80 80  Resp: 20 14 (!) 21 16  Temp:      SpO2: 95% 91% (!) 76% 96%  Weight:      Height:       Physical Exam Constitutional:      Comments: Mildly distressed and agitated due to difficulty sleeping  HENT:     Head: Normocephalic and atraumatic.     Mouth/Throat:     Mouth: Mucous membranes are moist.     Pharynx: Oropharynx is clear.  Eyes:     Extraocular Movements: Extraocular movements intact.     Pupils: Pupils are equal, round, and reactive to light.  Cardiovascular:     Rate and Rhythm: Regular rhythm. Tachycardia present.     Pulses: Normal pulses.     Heart sounds: Normal heart sounds.  Pulmonary:     Effort: Pulmonary effort is normal. No respiratory  distress.     Breath sounds: Normal breath sounds.  Abdominal:     General: Bowel sounds are normal. There is no distension.     Palpations: Abdomen is soft.     Tenderness: There is no abdominal tenderness.     Comments: Tachypnic  Musculoskeletal:        General: No swelling or deformity.  Skin:    General: Skin is warm and dry.  Neurological:     General: No focal deficit present.     Mental Status: He is alert.     Comments: Patient is awake, alert, oriented to person and place only Otherwise no focal deficit, move all 4 extremities spontaneously    Labs on Admission: I have personally reviewed following labs and imaging studies  CBC: Recent Labs  Lab 01/30/21 2052  WBC 5.6  HGB 14.1  HCT 40.7  MCV 88.3  PLT 481    Basic Metabolic Panel: Recent Labs  Lab 01/30/21 2052  NA 135  K 3.9  CL 97*  CO2 27  GLUCOSE 148*  BUN 14  CREATININE 0.98  CALCIUM 9.4    GFR: Estimated Creatinine Clearance: 67.2 mL/min (by C-G formula based on SCr of 0.98 mg/dL).  Liver Function Tests: Recent Labs  Lab 01/30/21 2052  AST 99*  ALT 73*  ALKPHOS 84  BILITOT 1.1  PROT 7.1  ALBUMIN 3.3*    Urine analysis:    Component Value Date/Time   COLORURINE YELLOW (A) 01/30/2021 2031   APPEARANCEUR CLEAR (A) 01/30/2021 2031   APPEARANCEUR CLEAR 01/24/2015 1849   LABSPEC 1.006 01/30/2021 2031   LABSPEC 1.025 01/24/2015 1849   PHURINE 6.0 01/30/2021 2031   GLUCOSEU NEGATIVE 01/30/2021 2031   GLUCOSEU NEGATIVE 01/24/2015 1849   HGBUR NEGATIVE 01/30/2021 2031   BILIRUBINUR NEGATIVE 01/30/2021 2031   BILIRUBINUR negative 06/11/2019 1538   BILIRUBINUR NEGATIVE 01/24/2015 1849   KETONESUR NEGATIVE 01/30/2021 2031   PROTEINUR NEGATIVE 01/30/2021 2031   UROBILINOGEN 0.2 06/11/2019 1538   UROBILINOGEN 1.0 10/20/2015 1259   NITRITE NEGATIVE 01/30/2021 2031   LEUKOCYTESUR NEGATIVE 01/30/2021 2031   LEUKOCYTESUR TRACE 01/24/2015 1849    Radiological Exams on Admission: CT  Head Wo Contrast  Result Date: 01/30/2021 CLINICAL DATA:  Delirium.  Fell. EXAM: CT HEAD WITHOUT CONTRAST TECHNIQUE: Contiguous axial images  were obtained from the base of the skull through the vertex without intravenous contrast. COMPARISON:  02/19/2019 FINDINGS: Brain: Age related volume loss. Mild chronic small-vessel ischemic change of the hemispheric white matter. No sign of acute infarction, mass lesion, hemorrhage, hydrocephalus or extra-axial collection. Vascular: There is atherosclerotic calcification of the major vessels at the base of the brain. Skull: Normal Sinuses/Orbits: Clear/normal Other: None IMPRESSION: No acute or traumatic finding. Age related volume loss and mild chronic small-vessel ischemic changes of the white matter. Electronically Signed   By: Nelson Chimes M.D.   On: 01/30/2021 21:27   CT ABDOMEN PELVIS W CONTRAST  Result Date: 01/30/2021 CLINICAL DATA:  Status post fall. EXAM: CT ABDOMEN AND PELVIS WITH CONTRAST TECHNIQUE: Multidetector CT imaging of the abdomen and pelvis was performed using the standard protocol following bolus administration of intravenous contrast. CONTRAST:  116mL OMNIPAQUE IOHEXOL 300 MG/ML  SOLN COMPARISON:  March 12, 2005 FINDINGS: Lower chest: An artificial cardiac valve is seen. Hepatobiliary: Multiple stable cystic appearing areas are noted within the liver. No gallstones, gallbladder wall thickening, or biliary dilatation. Pancreas: Unremarkable. No pancreatic ductal dilatation or surrounding inflammatory changes. Spleen: Normal in size without focal abnormality. Adrenals/Urinary Tract: Adrenal glands are unremarkable. Kidneys are normal in size, without obstructing renal calculi or hydronephrosis. A 1.5 cm diameter cyst is seen within the posterior aspect of the upper pole of the left kidney. A 5 mm nonobstructing renal stone is seen within the mid right kidney. Bladder is unremarkable. Stomach/Bowel: Stomach is within normal limits. Appendix appears  normal. No evidence of bowel wall thickening, distention, or inflammatory changes. Noninflamed diverticula are seen within the descending and sigmoid colon. Vascular/Lymphatic: Marked severity aortic calcification. No enlarged abdominal or pelvic lymph nodes. Reproductive: There is mild to moderate severity prostate gland enlargement. Other: No abdominal wall hernia or abnormality. No abdominopelvic ascites. Musculoskeletal: Degenerative changes are seen within the lumbar spine, most prominent at the levels of L3-L4 and L5-S1. IMPRESSION: 1. Multiple stable hepatic cysts. 2. 5 mm nonobstructing renal stone within the right kidney. 3. Colonic diverticulosis. 4. Enlarged prostate gland. 5. Degenerative changes of the lumbar spine, most prominent at the levels of L3-L4 and L5-S1. 6. Aortic atherosclerosis. Aortic Atherosclerosis (ICD10-I70.0). Electronically Signed   By: Virgina Norfolk M.D.   On: 01/30/2021 22:57   DG Chest Portable 1 View  Result Date: 01/30/2021 CLINICAL DATA:  Patient fell going to the bathroom.  Weakness. EXAM: PORTABLE CHEST 1 VIEW COMPARISON:  05/04/2018 FINDINGS: Stable changes from cardiac surgery and valve replacement. Heart is normal size. No mediastinal or hilar masses. Left anterior chest wall biventricular pacemaker is stable. Prominent bronchovascular and interstitial markings. No convincing lung consolidation or pulmonary edema. No pleural effusion or pneumothorax. Findings are similar to the prior chest radiograph. Skeletal structures are grossly intact. IMPRESSION: 1. No acute cardiopulmonary disease. 2. Chronic lung findings and chronic changes from prior cardiac surgery and valve replacement. Electronically Signed   By: Lajean Manes M.D.   On: 01/30/2021 20:52   EKG: Paced rhythm, 80 bpm.  Likely underlying A. fib due to lack of P waves.  Baseline wander.  PVCs.  Assessment/Plan Principal Problem:   Altered mental status Active Problems:   Pulmonary HTN (HCC)    Hyperlipidemia   CAD (coronary artery disease)   Centrilobular emphysema (HCC)   Systolic CHF, chronic (HCC)   S/P Minimally invasive maze operation for atrial fibrillation   S/P MVR (mitral valve repair)   Generalized anxiety disorder   OSA (  obstructive sleep apnea)   History of stroke   Hypothyroidism   CHB (complete heart block) ([place])  Fall Altered mental status OSA Insomnia > Patient with a fall last week denies hitting head or loss of consciousness, however wife reports increasing altered mentation since that time. > He has had decreased appetite, restlessness, poor sleep and episodes of incontinence.  His alert oriented self and place only in ED. > Patient reports primarily agitation and difficulty falling asleep with some sensation of shortness of breath > He did desaturate into the 70s in the ED when falling asleep which improved with 2 L of oxygen.  Suspect that nighttime hypoxia is playing a role in his symptoms along with a week of poor sleep. > Takes xanex for sleep, which has not been working as well recently - Monitor on telemetry - Continue oxygen therapy, trial CPAP if tolerated - Will likely benefit from sleep study upon discharge - We will check TSH and VBG as well - Will address anxiety to help with sleep  CHF > Per cardiology notes last EF was in 2019 and was 30 to 35%, he has declined further echocardiograms.  He also does not take Lasix daily as he is not being on taking pills he takes it about 3 times a week further last night. - Received 4 mg IV Lasix in ED - Monitor response and redose tomorrow - Consider repeat echo if amenable - I/O, Daily Weight - Add-on Mg  COPD Pulmonary hypertension > Not currently on any medications for this > Duoneb in ED - PRN Duonebs  A. fib status post Maze procedure > Per cardiology notes patient not on anticoagulation as he has declined - Continue to monitor  CAD > Nonobstructive on heart cath in 2016 > Initial  troponin negative in the ED - Not currently on any medications for this  Complete heart block > Status post nodal ablation - Pacemaker in place  History of mitral Valve prolapse status post repair  Depression Anxiety > Xanax not helping as much at home - Will trial PO ativan  Hyperlipidemia Hx of CVA > Not currently on any medications for this  Hypothyroidism  > History of this listed in his chart but not on any Synthroid - Checking TSH  Transaminitis > AST 99, ALT 73 > Does have chronic hepatic cysts which may be viral but also have a degree of heart failure which could also be contributing > Only mildly elevated so we will monitor for now - Trend hepatic function  DVT prophylaxis: Lovenox Code Status:   Full  Family Communication:  Discussed with significant other at bedside  Disposition Plan:   Patient is from:  Home  Anticipated DC to:  Home  Anticipated DC date:  1 to 3 days  Anticipated DC barriers: None  Consults called:  None  Admission status:  Observation, MedSurg with telemetry   Severity of Illness: The appropriate patient status for this patient is OBSERVATION. Observation status is judged to be reasonable and necessary in order to provide the required intensity of service to ensure the patient's safety. The patient's presenting symptoms, physical exam findings, and initial radiographic and laboratory data in the context of their medical condition is felt to place them at decreased risk for further clinical deterioration. Furthermore, it is anticipated that the patient will be medically stable for discharge from the hospital within 2 midnights of admission. The following factors support the patient status of observation.   " The patient's presenting  symptoms include confusion, weakness, fall. " The physical exam findings include mild confusion with disorientation to time or situation, trace edema. " The initial radiographic and laboratory data are LFT  elevation, BNP 242, otherwise stable.  Marcelyn Bruins MD [facility]  How to contact the [facility] Attending or Consulting provider [place] or covering provider during after hours [place], for this patient?   1. Check the care team in [facility] and look for a) attending/consulting TR[facility] provider listed and b) the [facility] team listed 2. Log into [email or web address] and use [place]'s universal password to access. If you do not have the password, please contact the hospital operator. 3. Locate the [facility] provider you are looking for under [facility] and page to a number that you can be directly reached. 4. If you still have difficulty reaching the provider, please page the [facility] (Director on Call) for the Hospitalists listed on amion for assistance.  01/31/2021, 12:30 AM

## 2021-01-30 NOTE — ED Provider Notes (Addendum)
[facility] Emergency Department Provider Note  ____________________________________________   Event Date/Time   First MD Initiated Contact with Patient 01/30/21 2012     (approximate)  I have reviewed the triage vital signs and the nursing notes.   HISTORY  Chief Complaint Altered Mental Status    HPI Michael Delacruz is a 80 y.o. male with past medical history as below here with delirium.  Patient reportedly has had decreased appetite for the last several weeks.  Over the last week, he has had increasing confusion and weakness.  He fell a week ago, but did not necessarily hit his head.  Since then, however, family states that he has been increasingly confused.  He is having difficulty sleeping.  He has had episodes of incontinence which are new.  He has had generalized weakness.  He is not wanting to eat or drink.  On my assessment, he is somewhat confused as to why is here.  Denies any specific complaints or pain.  Of note, he was told by his cardiologist that his pacemaker was telling him that he had fluid on him so he has increased his diuresis over the last several days.  No known Covid exposures.        Past Medical History:  Diagnosis Date  . Arthritis   . Asthma   . Bradycardia   . CAD in native artery    a. LHC 09/2015: 40% pCx, 35% mRCA.  [name] COPD (chronic obstructive pulmonary disease) ([place])   . Dilation of intestine 01/2015  . Fibromyalgia   . Frequent headaches   . GERD (gastroesophageal reflux disease)   . History of blood clots    eye   . History of hiatal hernia   . History of rheumatic fever   . Hypertension   . Kidney stone   . PAF (paroxysmal atrial fibrillation) ([place])    a. s/p TEE/DCCV 07/2015. b. H/o bleeding on Coumadin when INR >5, changed to Eliquis.  . S/P Minimally invasive maze operation for atrial fibrillation 10/22/2015   Complete bilateral atrial lesion set using cryothermy and bipolar radiofrequency ablation with  clipping of LA appendage via right mini thoracotomy approach  . S/P minimally invasive mitral valve repair 10/22/2015   Complex valvuloplasty including triangular resection of posterior leaflet, artificial Gore-tex neochord placement x6 and 38 mm Sorin Memo 3D Rechord ring annuloplasty via right minithoracotomy approach  . Severe mitral regurgitation    a. s/p MV repair 10/2015.  [name] Sleep apnea   . Stroke ([place])   . Thyroid disorder   . TIA (transient ischemic attack)   . Tobacco abuse     Patient Active Problem List   Diagnosis Date Noted  . Altered mental status 01/31/2021  . CHB (complete heart block) ([place]) 01/31/2021  . Generalized anxiety disorder 05/04/2018  . OSA (obstructive sleep apnea) 05/04/2018  . History of stroke 05/04/2018  . Retinal hemorrhage, left eye 05/04/2018  . Late effects of cerebral ischemic stroke 05/04/2018  . Hypothyroidism 05/04/2018  . Permanent atrial fibrillation ([place]) 05/02/2018  . Tachycardia induced cardiomyopathy ([place]) 05/02/2018  . Hypokalemia 05/02/2018  . Constipation 04/25/2018  . Insomnia due to medical condition 02/22/2018  . Depression, major, recurrent, moderate ([place]) 01/16/2018  . Somatic symptom disorder 01/16/2018  . Vision loss 12/31/2015  . TIA (transient ischemic attack) 12/06/2015  . S/P MVR (mitral valve repair) 11/11/2015  . S/P Minimally invasive maze operation for atrial fibrillation 10/22/2015  . Systolic CHF, chronic ([place])   .  Atrial fibrillation with RVR ([place])   . Centrilobular emphysema ([place])   . Hypotension 11/02/2012  . Stress at home 11/02/2012  . Smoking 04/27/2012  . Hyperlipidemia 04/27/2012  . CAD (coronary artery disease) 04/27/2012  . Heart palpitations 09/03/2011  . Dizziness 05/19/2011  . MVP (mitral valve prolapse) 05/05/2011  . Mitral valve regurgitation 05/05/2011  . Pulmonary HTN ([place]) 05/05/2011    Past Surgical History:  Procedure Laterality Date  . ANKLE SURGERY    . AV NODE ABLATION N/A  05/04/2018   Procedure: AV NODE ABLATION;  Surgeon: Thompson Grayer, MD;  Location: [facility];  Service: Cardiovascular;  Laterality: N/A;  . BIV PACEMAKER INSERTION CRT-P N/A 05/03/2018   Procedure: BIV PACEMAKER INSERTION CRT-P;  Surgeon: Deboraha Sprang, MD;  Location: [facility];  Service: Cardiovascular;  Laterality: N/A;  . CARDIAC CATHETERIZATION N/A 10/03/2015   Procedure: Right and Left Heart Cath and Coronary Angiography;  Surgeon: [name], MD;  Location: [facility];  Service: Cardiovascular;  Laterality: N/A;  . COLONOSCOPY    . ELECTROPHYSIOLOGIC STUDY N/A 08/18/2015   Procedure: CARDIOVERSION;  Surgeon: Wellington Hampshire, MD;  Location: [facility];  Service: Cardiovascular;  Laterality: N/A;  . MINIMALLY INVASIVE MAZE PROCEDURE N/A 10/22/2015   Procedure: MINIMALLY INVASIVE MAZE PROCEDURE;  Surgeon: Rexene Alberts, MD;  Location: [facility];  Service: Open Heart Surgery;  Laterality: N/A;  . MITRAL VALVE REPAIR Right 10/22/2015   Procedure: MINIMALLY INVASIVE MITRAL VALVE REPAIR (MVR);  Surgeon: Rexene Alberts, MD;  Location: [place];  Service: Open Heart Surgery;  Laterality: Right;  . SINUS EXPLORATION    . TEE WITHOUT CARDIOVERSION N/A 08/18/2015   Procedure: TRANSESOPHAGEAL ECHOCARDIOGRAM (TEE);  Surgeon: Wellington Hampshire, MD;  Location: [facility];  Service: Cardiovascular;  Laterality: N/A;  . TEE WITHOUT CARDIOVERSION N/A 10/22/2015   Procedure: TRANSESOPHAGEAL ECHOCARDIOGRAM (TEE);  Surgeon: Rexene Alberts, MD;  Location: [facility];  Service: Open Heart Surgery;  Laterality: N/A;    Prior to Admission medications   Medication Sig Start Date End Date Taking? Authorizing Provider  ALPRAZolam (XANAX) 0.25 MG tablet Take 1 tablet (0.25 mg total) by mouth 2 (two) times daily as needed for anxiety. 12/23/20  Yes Karamalegos, Devonne Doughty, DO  furosemide (LASIX) 40 MG tablet TAKE 1 TABLET BY MOUTH TWICE A DAY AS NEEDED 11/05/20  Yes Deboraha Sprang, MD  Homeopathic  Products (HOMEOPATHIC CALM SL) Place under the tongue. Is using Doterra essential oil of Onguard ingestion daily   Yes [provider]  hydrOXYzine (ATARAX/VISTARIL) 25 MG tablet Take 1 tablet (25 mg total) by mouth at bedtime as needed for anxiety (sleep). 01/29/21  Yes Karamalegos, Alexander J, DO  ipratropium (ATROVENT) 0.06 % nasal spray Place 2 sprays into both nostrils 4 (four) times daily. For up to 5-7 days then stop. 01/29/21  Yes Karamalegos, Devonne Doughty, DO  predniSONE (DELTASONE) 10 MG tablet Take 6 tabs with breakfast Day 1, 5 tabs Day 2, 4 tabs Day 3, 3 tabs Day 4, 2 tabs Day 5, 1 tab Day 6. 01/29/21  Yes Karamalegos, Devonne Doughty, DO    Allergies Codeine, Digoxin and related, Macrodantin [nitrofurantoin macrocrystal], and Morphine and related  Family History  Problem Relation Age of Onset  . Stroke Mother   . Irregular heart beat Mother   . Heart murmur Brother   . Pulmonary embolism Brother   . Hypertension Other   . Pulmonary embolism Maternal Uncle  Social History Social History   Tobacco Use  . Smoking status: Current Every Day Smoker    Packs/day: 0.50    Years: 52.00    Pack years: 26.00    Types: Cigarettes    Last attempt to quit: 02/13/2018    Years since quitting: 2.9  . Smokeless tobacco: Current User  . Tobacco comment: Resumed smoking, after quit 01/2018  Vaping Use  . Vaping Use: Never used  Substance Use Topics  . Alcohol use: Not Currently    Alcohol/week: 1.0 standard drink    Types: 1 Standard drinks or equivalent per week    Comment: occasionally drinks a margarita  . Drug use: No    Review of Systems  Review of Systems  Unable to perform ROS: Mental status change  Constitutional: Positive for fatigue.  Neurological: Positive for weakness.  Psychiatric/Behavioral: Positive for confusion.     ____________________________________________  PHYSICAL EXAM:      VITAL SIGNS: ED Triage Vitals  Enc Vitals Group     BP 01/30/21  1956 (!) 128/94     Pulse Rate 01/30/21 1956 80     Resp 01/30/21 1956 (!) 22     Temp 01/30/21 1956 97.7 F (36.5 C)     Temp src --      SpO2 01/30/21 1956 97 %     Weight 01/30/21 1957 197 lb (89.4 kg)     Height 01/30/21 1957 5\' 11"  (1.803 m)     Head Circumference --      Peak Flow --      Pain Score 01/30/21 1956 8     Pain Loc --      Pain Edu? --      Excl. in [place]? --      Physical Exam Vitals and nursing note reviewed.  Constitutional:      General: He is not in acute distress.    Appearance: He is well-developed.  HENT:     Head: Normocephalic and atraumatic.     Mouth/Throat:     Mouth: Mucous membranes are dry.  Eyes:     Conjunctiva/sclera: Conjunctivae normal.  Cardiovascular:     Rate and Rhythm: Normal rate and regular rhythm.     Heart sounds: Normal heart sounds. No murmur heard. No friction rub.  Pulmonary:     Effort: Pulmonary effort is normal. No respiratory distress.     Breath sounds: Normal breath sounds. No wheezing or rales.  Abdominal:     General: There is no distension.     Palpations: Abdomen is soft.     Tenderness: There is no abdominal tenderness.  Musculoskeletal:     Cervical back: Neck supple.     Right lower leg: Edema present.     Left lower leg: Edema present.  Skin:    General: Skin is warm.     Capillary Refill: Capillary refill takes less than 2 seconds.  Neurological:     Mental Status: He is alert. He is disoriented.     GCS: GCS eye subscore is 4. GCS verbal subscore is 5. GCS motor subscore is 6.     Sensory: Sensation is intact.     Motor: No abnormal muscle tone.     Coordination: Coordination is intact.       ____________________________________________   LABS (all labs ordered are listed, but only abnormal results are displayed)  Labs Reviewed  COMPREHENSIVE METABOLIC PANEL - Abnormal; Notable for the following components:      Result  Value   Chloride 97 (*)    Glucose, Bld 148 (*)    Albumin 3.3 (*)     AST 99 (*)    ALT 73 (*)    All other components within normal limits  URINALYSIS, COMPLETE (UACMP) WITH MICROSCOPIC - Abnormal; Notable for the following components:   Color, Urine YELLOW (*)    APPearance CLEAR (*)    All other components within normal limits  BRAIN NATRIURETIC PEPTIDE - Abnormal; Notable for the following components:   B Natriuretic Peptide 242.4 (*)    All other components within normal limits  TROPONIN I (HIGH SENSITIVITY) - Abnormal; Notable for the following components:   Troponin I (High Sensitivity) 18 (*)    All other components within normal limits  RESP PANEL BY RT-PCR (FLU A&B, COVID) ARPGX2  CBC  TSH  COMPREHENSIVE METABOLIC PANEL  CBC  BLOOD GAS, VENOUS  MAGNESIUM  CBG MONITORING, ED  TROPONIN I (HIGH SENSITIVITY)    ____________________________________________  EKG: Paced rhythm, ventricular rate 80.  QTc 525.  Nonspecific intraventricular conduction delay..  PVCs.  No acute ST elevations. ________________________________________  RADIOLOGY All imaging, including plain films, CT scans, and ultrasounds, independently reviewed by me, and interpretations confirmed via formal radiology reads.  ED MD interpretation:   CT head: Negative Chest x-ray: Chronic lung findings, no acute disease  Official radiology report(s): CT Head Wo Contrast  Result Date: 01/30/2021 CLINICAL DATA:  Delirium.  Fell. EXAM: CT HEAD WITHOUT CONTRAST TECHNIQUE: Contiguous axial images were obtained from the base of the skull through the vertex without intravenous contrast. COMPARISON:  02/19/2019 FINDINGS: Brain: Age related volume loss. Mild chronic small-vessel ischemic change of the hemispheric white matter. No sign of acute infarction, mass lesion, hemorrhage, hydrocephalus or extra-axial collection. Vascular: There is atherosclerotic calcification of the major vessels at the base of the brain. Skull: Normal Sinuses/Orbits: Clear/normal Other: None IMPRESSION: No acute  or traumatic finding. Age related volume loss and mild chronic small-vessel ischemic changes of the white matter. Electronically Signed   By: Nelson Chimes M.D.   On: 01/30/2021 21:27   CT ABDOMEN PELVIS W CONTRAST  Result Date: 01/30/2021 CLINICAL DATA:  Status post fall. EXAM: CT ABDOMEN AND PELVIS WITH CONTRAST TECHNIQUE: Multidetector CT imaging of the abdomen and pelvis was performed using the standard protocol following bolus administration of intravenous contrast. CONTRAST:  154mL OMNIPAQUE IOHEXOL 300 MG/ML  SOLN COMPARISON:  March 12, 2005 FINDINGS: Lower chest: An artificial cardiac valve is seen. Hepatobiliary: Multiple stable cystic appearing areas are noted within the liver. No gallstones, gallbladder wall thickening, or biliary dilatation. Pancreas: Unremarkable. No pancreatic ductal dilatation or surrounding inflammatory changes. Spleen: Normal in size without focal abnormality. Adrenals/Urinary Tract: Adrenal glands are unremarkable. Kidneys are normal in size, without obstructing renal calculi or hydronephrosis. A 1.5 cm diameter cyst is seen within the posterior aspect of the upper pole of the left kidney. A 5 mm nonobstructing renal stone is seen within the mid right kidney. Bladder is unremarkable. Stomach/Bowel: Stomach is within normal limits. Appendix appears normal. No evidence of bowel wall thickening, distention, or inflammatory changes. Noninflamed diverticula are seen within the descending and sigmoid colon. Vascular/Lymphatic: Marked severity aortic calcification. No enlarged abdominal or pelvic lymph nodes. Reproductive: There is mild to moderate severity prostate gland enlargement. Other: No abdominal wall hernia or abnormality. No abdominopelvic ascites. Musculoskeletal: Degenerative changes are seen within the lumbar spine, most prominent at the levels of L3-L4 and L5-S1. IMPRESSION: 1. Multiple stable hepatic cysts.  2. 5 mm nonobstructing renal stone within the right kidney. 3.  Colonic diverticulosis. 4. Enlarged prostate gland. 5. Degenerative changes of the lumbar spine, most prominent at the levels of L3-L4 and L5-S1. 6. Aortic atherosclerosis. Aortic Atherosclerosis (ICD10-I70.0). Electronically Signed   By: Virgina Norfolk M.D.   On: 01/30/2021 22:57   DG Chest Portable 1 View  Result Date: 01/30/2021 CLINICAL DATA:  Patient fell going to the bathroom.  Weakness. EXAM: PORTABLE CHEST 1 VIEW COMPARISON:  05/04/2018 FINDINGS: Stable changes from cardiac surgery and valve replacement. Heart is normal size. No mediastinal or hilar masses. Left anterior chest wall biventricular pacemaker is stable. Prominent bronchovascular and interstitial markings. No convincing lung consolidation or pulmonary edema. No pleural effusion or pneumothorax. Findings are similar to the prior chest radiograph. Skeletal structures are grossly intact. IMPRESSION: 1. No acute cardiopulmonary disease. 2. Chronic lung findings and chronic changes from prior cardiac surgery and valve replacement. Electronically Signed   By: Lajean Manes M.D.   On: 01/30/2021 20:52    ____________________________________________  PROCEDURES   Procedure(s) performed (including Critical Care):  .1-3 Lead EKG Interpretation Performed by: Duffy Bruce, MD Authorized by: Duffy Bruce, MD     Interpretation: normal     ECG rate:  80-90s   ECG rate assessment: normal     Rhythm: sinus rhythm     Ectopy: none     Conduction: normal   Comments:     Indication: weakness, resp failure    ____________________________________________  INITIAL IMPRESSION / MDM / [place] / ED COURSE  As part of my medical decision making, I reviewed the following data within the [facility] notes reviewed and incorporated, Old chart reviewed, Notes from prior ED visits, and [place] Controlled Substance Database       *RANDE ROYLANCE was evaluated in Emergency Department on 01/31/2021 for  the symptoms described in the history of present illness. He was evaluated in the context of the global COVID-19 pandemic, which necessitated consideration that the patient might be at risk for infection with the SARS-CoV-2 virus that causes COVID-19. Institutional protocols and algorithms that pertain to the evaluation of patients at risk for COVID-19 are in a state of rapid change based on information released by regulatory bodies including the CDC and federal and state organizations. These policies and algorithms were followed during the patient's care in the ED.  Some ED evaluations and interventions may be delayed as a result of limited staffing during the pandemic.*     Medical Decision Making:  80 yo M with h/o relatively recent COVID-19 here with worsening weakness, loss of appetite, and confusion. Labs are overall reassuring. CBC without significant anemia or leukocytosis. CMP shows mild likely viral transaminitis. EKG non ischemic. BNP elevated and pt does have pitting edema b/l, likely from mild CHF, though CXR is overall unremarkable. UA without signs of UTI. CT head negative and he has no focal neurological deficits.  Suspect confusion/weakness 2/2 recent COVID, likely exacerbated by hypoxia from possible CHF as well as OSA. Pt noted to desat consistently in ED, requiring 2L [place]. Will admit for further work-up and oxygen.  ____________________________________________  FINAL CLINICAL IMPRESSION(S) / ED DIAGNOSES  Final diagnoses:  Altered mental status, unspecified altered mental status type  Hypoxia  Acute congestive heart failure, unspecified heart failure type ([place])     MEDICATIONS GIVEN DURING THIS VISIT:  Medications  enoxaparin (LOVENOX) injection 40 mg (has no administration in time range)  sodium chloride  flush (NS) 0.9 % injection 3 mL (3 mLs Intravenous Not Given 01/31/21 0105)  acetaminophen (TYLENOL) tablet 650 mg (has no administration in time range)    Or   acetaminophen (TYLENOL) suppository 650 mg (has no administration in time range)  polyethylene glycol (MIRALAX / GLYCOLAX) packet 17 g (has no administration in time range)  iohexol (OMNIPAQUE) 300 MG/ML solution 100 mL (100 mLs Intravenous Contrast Given 01/30/21 2237)  furosemide (LASIX) injection 40 mg (40 mg Intravenous Given 01/30/21 2353)  ipratropium-albuterol (DUONEB) 0.5-2.5 (3) MG/3ML nebulizer solution 3 mL (3 mLs Nebulization Given 01/30/21 2357)  haloperidol lactate (HALDOL) injection 2.5 mg (2.5 mg Intravenous Given 01/31/21 0035)     ED Discharge Orders    None       Note:  This document was prepared using Dragon voice recognition software and may include unintentional dictation errors.   Duffy Bruce, MD 01/31/21 Margaretha Glassing    Duffy Bruce, MD 01/31/21 [phone]

## 2021-01-30 NOTE — ED Notes (Signed)
Patient's wife bedside. Wife reports increased confusion, loss of appetite, restlessness, not sleeping. "He is not like him at all". Patient's wife reports symptoms have been worsening since he fell last week. Per patient's wife, patient has pacemaker and had been told to increase his dose of lasix.  Patient is alert and oriented to self and place, disoriented to time and situation.

## 2021-01-31 ENCOUNTER — Encounter: Payer: Self-pay | Admitting: Internal Medicine

## 2021-01-31 DIAGNOSIS — R627 Adult failure to thrive: Secondary | ICD-10-CM | POA: Diagnosis present

## 2021-01-31 DIAGNOSIS — M797 Fibromyalgia: Secondary | ICD-10-CM | POA: Diagnosis present

## 2021-01-31 DIAGNOSIS — I442 Atrioventricular block, complete: Secondary | ICD-10-CM

## 2021-01-31 DIAGNOSIS — K219 Gastro-esophageal reflux disease without esophagitis: Secondary | ICD-10-CM | POA: Diagnosis present

## 2021-01-31 DIAGNOSIS — I5022 Chronic systolic (congestive) heart failure: Secondary | ICD-10-CM | POA: Diagnosis present

## 2021-01-31 DIAGNOSIS — F32A Depression, unspecified: Secondary | ICD-10-CM | POA: Diagnosis present

## 2021-01-31 DIAGNOSIS — G4733 Obstructive sleep apnea (adult) (pediatric): Secondary | ICD-10-CM | POA: Diagnosis present

## 2021-01-31 DIAGNOSIS — J432 Centrilobular emphysema: Secondary | ICD-10-CM

## 2021-01-31 DIAGNOSIS — I4819 Other persistent atrial fibrillation: Secondary | ICD-10-CM | POA: Diagnosis present

## 2021-01-31 DIAGNOSIS — I272 Pulmonary hypertension, unspecified: Secondary | ICD-10-CM | POA: Diagnosis present

## 2021-01-31 DIAGNOSIS — F411 Generalized anxiety disorder: Secondary | ICD-10-CM | POA: Diagnosis present

## 2021-01-31 DIAGNOSIS — Z6830 Body mass index (BMI) 30.0-30.9, adult: Secondary | ICD-10-CM | POA: Diagnosis not present

## 2021-01-31 DIAGNOSIS — G4700 Insomnia, unspecified: Secondary | ICD-10-CM | POA: Diagnosis present

## 2021-01-31 DIAGNOSIS — E785 Hyperlipidemia, unspecified: Secondary | ICD-10-CM | POA: Diagnosis present

## 2021-01-31 DIAGNOSIS — I2510 Atherosclerotic heart disease of native coronary artery without angina pectoris: Secondary | ICD-10-CM | POA: Diagnosis present

## 2021-01-31 DIAGNOSIS — R4182 Altered mental status, unspecified: Secondary | ICD-10-CM | POA: Diagnosis present

## 2021-01-31 DIAGNOSIS — Z952 Presence of prosthetic heart valve: Secondary | ICD-10-CM | POA: Diagnosis not present

## 2021-01-31 DIAGNOSIS — E876 Hypokalemia: Secondary | ICD-10-CM | POA: Diagnosis present

## 2021-01-31 DIAGNOSIS — Z950 Presence of cardiac pacemaker: Secondary | ICD-10-CM | POA: Diagnosis not present

## 2021-01-31 DIAGNOSIS — E039 Hypothyroidism, unspecified: Secondary | ICD-10-CM | POA: Diagnosis present

## 2021-01-31 DIAGNOSIS — G9340 Encephalopathy, unspecified: Principal | ICD-10-CM

## 2021-01-31 DIAGNOSIS — I110 Hypertensive heart disease with heart failure: Secondary | ICD-10-CM | POA: Diagnosis present

## 2021-01-31 DIAGNOSIS — Z8673 Personal history of transient ischemic attack (TIA), and cerebral infarction without residual deficits: Secondary | ICD-10-CM | POA: Diagnosis not present

## 2021-01-31 DIAGNOSIS — Z8616 Personal history of COVID-19: Secondary | ICD-10-CM | POA: Diagnosis not present

## 2021-01-31 DIAGNOSIS — Z20822 Contact with and (suspected) exposure to covid-19: Secondary | ICD-10-CM | POA: Diagnosis present

## 2021-01-31 LAB — CBC
HCT: 40.3 % (ref 39.0–52.0)
Hemoglobin: 14.1 g/dL (ref 13.0–17.0)
MCH: 30.9 pg (ref 26.0–34.0)
MCHC: 35 g/dL (ref 30.0–36.0)
MCV: 88.4 fL (ref 80.0–100.0)
Platelets: 310 10*3/uL (ref 150–400)
RBC: 4.56 MIL/uL (ref 4.22–5.81)
RDW: 12.8 % (ref 11.5–15.5)
WBC: 7 10*3/uL (ref 4.0–10.5)
nRBC: 0 % (ref 0.0–0.2)

## 2021-01-31 LAB — COMPREHENSIVE METABOLIC PANEL
ALT: 77 U/L — ABNORMAL HIGH (ref 0–44)
AST: 100 U/L — ABNORMAL HIGH (ref 15–41)
Albumin: 3.2 g/dL — ABNORMAL LOW (ref 3.5–5.0)
Alkaline Phosphatase: 81 U/L (ref 38–126)
Anion gap: 15 (ref 5–15)
BUN: 14 mg/dL (ref 8–23)
CO2: 24 mmol/L (ref 22–32)
Calcium: 9.2 mg/dL (ref 8.9–10.3)
Chloride: 99 mmol/L (ref 98–111)
Creatinine, Ser: 0.91 mg/dL (ref 0.61–1.24)
GFR, Estimated: >60 mL/min (ref >60)
Glucose, Bld: 130 mg/dL — ABNORMAL HIGH (ref 70–99)
Potassium: 3.1 mmol/L — ABNORMAL LOW (ref 3.5–5.1)
Sodium: 138 mmol/L (ref 135–145)
Total Bilirubin: 1.1 mg/dL (ref 0.3–1.2)
Total Protein: 6.7 g/dL (ref 6.5–8.1)

## 2021-01-31 LAB — TSH: TSH: 1.029 u[IU]/mL (ref 0.350–4.500)

## 2021-01-31 LAB — MAGNESIUM: Magnesium: 2.6 mg/dL — ABNORMAL HIGH (ref 1.7–2.4)

## 2021-01-31 LAB — TROPONIN I (HIGH SENSITIVITY): Troponin I (High Sensitivity): 18 ng/L — ABNORMAL HIGH (ref <18)

## 2021-01-31 MED ORDER — TORSEMIDE 20 MG PO TABS
40.0000 mg | ORAL_TABLET | Freq: Every day | ORAL | Status: DC
  Administered 2021-01-31 – 2021-02-02 (×3): 40 mg via ORAL
  Filled 2021-01-31 (×3): qty 2

## 2021-01-31 MED ORDER — ACETAMINOPHEN 650 MG RE SUPP: 650.0000 mg | Freq: Four times a day (QID) | RECTAL | Status: DC | PRN

## 2021-01-31 MED ORDER — ENSURE ENLIVE PO LIQD
237.0000 mL | Freq: Two times a day (BID) | ORAL | Status: DC
  Administered 2021-01-31 – 2021-02-02 (×4): 237 mL via ORAL

## 2021-01-31 MED ORDER — POTASSIUM CHLORIDE 20 MEQ PO PACK
40.0000 meq | PACK | Freq: Once | ORAL | Status: AC
  Administered 2021-01-31: 16:00:00 40 meq via ORAL
  Filled 2021-01-31: qty 2

## 2021-01-31 MED ORDER — POTASSIUM CHLORIDE 20 MEQ PO PACK
40.0000 meq | PACK | ORAL | Status: DC
  Administered 2021-01-31: 40 meq via ORAL
  Filled 2021-01-31 (×2): qty 2

## 2021-01-31 MED ORDER — POLYETHYLENE GLYCOL 3350 17 G PO PACK: 17.0000 g | PACK | Freq: Every day | ORAL | Status: DC | PRN

## 2021-01-31 MED ORDER — SODIUM CHLORIDE 0.9% FLUSH
3.0000 mL | Freq: Two times a day (BID) | INTRAVENOUS | Status: DC
  Administered 2021-01-31 – 2021-02-02 (×5): 3 mL via INTRAVENOUS

## 2021-01-31 MED ORDER — ENOXAPARIN SODIUM 40 MG/0.4ML ~~LOC~~ SOLN
40.0000 mg | SUBCUTANEOUS | Status: DC
  Administered 2021-01-31 – 2021-02-02 (×3): 40 mg via SUBCUTANEOUS
  Filled 2021-01-31 (×3): qty 0.4

## 2021-01-31 MED ORDER — ADULT MULTIVITAMIN W/MINERALS CH
1.0000 | ORAL_TABLET | Freq: Every day | ORAL | Status: DC
  Administered 2021-01-31 – 2021-02-02 (×3): 1 via ORAL
  Filled 2021-01-31 (×2): qty 1

## 2021-01-31 MED ORDER — HALOPERIDOL LACTATE 5 MG/ML IJ SOLN: 2.5000 mg | Freq: Once | INTRAMUSCULAR | Status: AC

## 2021-01-31 MED ORDER — LORAZEPAM 1 MG PO TABS: 1.0000 mg | ORAL_TABLET | Freq: Every day | ORAL | Status: DC

## 2021-01-31 MED ORDER — HALOPERIDOL LACTATE 5 MG/ML IJ SOLN
INTRAMUSCULAR | Status: AC
  Administered 2021-01-31: 2.5 mg via INTRAVENOUS
  Filled 2021-01-31: qty 1

## 2021-01-31 MED ORDER — ACETAMINOPHEN 325 MG PO TABS: 650.0000 mg | ORAL_TABLET | Freq: Four times a day (QID) | ORAL | Status: DC | PRN

## 2021-01-31 MED ORDER — QUETIAPINE FUMARATE 25 MG PO TABS
25.0000 mg | ORAL_TABLET | Freq: Every day | ORAL | Status: DC
  Administered 2021-01-31 – 2021-02-01 (×2): 25 mg via ORAL
  Filled 2021-01-31 (×2): qty 1

## 2021-01-31 NOTE — Progress Notes (Signed)
PROGRESS NOTE    Michael Delacruz  WJX:[id number] DOB: 01-Feb-1941 DOA: 01/30/2021 PCP: [name], DO   Chief Complaint.  Altered mental status. Brief Narrative:   Michael Delacruz is a 80 y.o. male with medical history significant of CHF, A. fib status post Maze procedure, CAD, mitral valve prolapse status post repair, COPD, pulmonary hypertension, OSA, depression, anxiety, hyperlipidemia, CVA, hypothyroidism, insomnia who presents after having a fall last week and some increasing altered mentation since that time. Patient has back pain and leg pain with difficulty walking for about a year.  Since had a Covid, patient had increased weakness.  About 10 days ago, he had a fall.  Since that time, patient has been having difficulty with sleeping, more confused.  He also has very poor appetite, he drinks water, but does not eat much. CT scan of the head did not show any acute changes.  Patient has pacemaker, not able to perform MRI.  Assessment & Plan:   Principal Problem:   Altered mental status Active Problems:   Pulmonary HTN (HCC)   Hyperlipidemia   CAD (coronary artery disease)   Centrilobular emphysema (HCC)   Systolic CHF, chronic (HCC)   S/P Minimally invasive maze operation for atrial fibrillation   S/P MVR (mitral valve repair)   Generalized anxiety disorder   OSA (obstructive sleep apnea)   History of stroke   Hypothyroidism   CHB (complete heart block) (HCC)   Encephalopathy  #1. Encephalopathy. Insomnia. Obstructive sleep apnea. Patient condition appears to be multifactorial, recent Covid infection, sleep deprivation, obstructive sleep apnea all play a rule in patient mental status changes.  Based on CT scan of the head, patient may also has some baseline dementia. The first step of the treatment is to improve sleep.  We will start with the Seroquel 25 mg every evening. We will also start CPAP while asleep. We will obtain neurology consult if condition  does not improve.  Currently do not suspect infection source of encephalopathy.  2.  Chronic systolic congestive heart failure. Patient ejection fraction was 30 to 35%.  He had received IV Lasix in the emergency room, currently volume status is better.  I will continue 40 mg torsemide daily.  #3.  COPD. Pulmonary hypertension. Continue home medicines.  4.  Persistent atrial fibrillation status post maze procedure. Not currently on anticoagulation due to functional decline.  5.  Failure to thrive. We will start physical therapy/Occupational Therapy.  6.  Status pacemaker with history of third-degree AV block.  7.  Hypokalemia Supplement orally.    DVT prophylaxis: Lovenox Code Status: Full Family Communication: Wife updated on phone. Disposition Plan:  .   Status is: Inpatient  Remains inpatient appropriate because:Inpatient level of care appropriate due to severity of illness   Dispo: The patient is from: Home              Anticipated d/c is to: Home              Anticipated d/c date is: 3 days              Patient currently is not medically stable to d/c.   Difficult to place patient No        No intake/output data recorded. Total I/O In: -  Out: 300 [Urine:300]     Consultants:   None  Procedures: None  Antimicrobials:None  Subjective: Patient still very confused, not agitated. He denies any headache or dizziness. No fever or chills. No abdominal  pain or nausea vomiting. No short of breath or cough. No dysuria or hematuria  Objective: Vitals:   01/31/21 0230 01/31/21 0312 01/31/21 0452 01/31/21 0800  BP: (!) 141/88 135/79  (!) 142/50  Pulse: 78 74  81  Resp:  16  16  Temp:  97.6 F (36.4 C)  97.7 F (36.5 C)  TempSrc:  Oral    SpO2: 95% 97%  93%  Weight:   98.4 kg   Height:        Intake/Output Summary (Last 24 hours) at 01/31/2021 0904 Last data filed at 01/31/2021 0837 Gross per 24 hour  Intake --  Output 300 ml  Net -300 ml    Filed Weights   01/30/21 1957 01/31/21 0452  Weight: 89.4 kg 98.4 kg    Examination:  General exam: Appears calm and comfortable  Respiratory system: Clear to auscultation. Respiratory effort normal. Cardiovascular system: S1 & S2 heard, RRR. No JVD, murmurs, rubs, gallops or clicks. No pedal edema. Gastrointestinal system: Abdomen is nondistended, soft and nontender. No organomegaly or masses felt. Normal bowel sounds heard. Central nervous system: Alert and oriented x2. No focal neurological deficits. Extremities: Symmetric  Skin: No rashes, lesions or ulcers Psychiatry:  Mood & affect appropriate.     Data Reviewed: I have personally reviewed following labs and imaging studies  CBC: Recent Labs  Lab 01/30/21 2052 01/31/21 0532  WBC 5.6 7.0  HGB 14.1 14.1  HCT 40.7 40.3  MCV 88.3 88.4  PLT 320 185   Basic Metabolic Panel: Recent Labs  Lab 01/30/21 2052 01/31/21 0532  NA 135 138  K 3.9 3.1*  CL 97* 99  CO2 27 24  GLUCOSE 148* 130*  BUN 14 14  CREATININE 0.98 0.91  CALCIUM 9.4 9.2  MG 2.6*  --    GFR: Estimated Creatinine Clearance: 81.3 mL/min (by C-G formula based on SCr of 0.91 mg/dL). Liver Function Tests: Recent Labs  Lab 01/30/21 2052 01/31/21 0532  AST 99* 100*  ALT 73* 77*  ALKPHOS 84 81  BILITOT 1.1 1.1  PROT 7.1 6.7  ALBUMIN 3.3* 3.2*   No results for input(s): LIPASE, AMYLASE in the last 168 hours. No results for input(s): AMMONIA in the last 168 hours. Coagulation Profile: No results for input(s): INR, PROTIME in the last 168 hours. Cardiac Enzymes: No results for input(s): CKTOTAL, CKMB, CKMBINDEX, TROPONINI in the last 168 hours. BNP (last 3 results) No results for input(s): PROBNP in the last 8760 hours. HbA1C: No results for input(s): HGBA1C in the last 72 hours. CBG: No results for input(s): GLUCAP in the last 168 hours. Lipid Profile: No results for input(s): CHOL, HDL, LDLCALC, TRIG, CHOLHDL, LDLDIRECT in the last 72  hours. Thyroid Function Tests: Recent Labs    01/30/21 2356  TSH 1.029   Anemia Panel: No results for input(s): VITAMINB12, FOLATE, FERRITIN, TIBC, IRON, RETICCTPCT in the last 72 hours. Sepsis Labs: No results for input(s): PROCALCITON, LATICACIDVEN in the last 168 hours.  Recent Results (from the past 240 hour(s))  Resp Panel by RT-PCR (Flu A&B, Covid) Nasopharyngeal Swab     Status: None   Collection Time: 01/30/21  8:32 PM   Specimen: Nasopharyngeal Swab; Nasopharyngeal(NP) swabs in vial transport medium  Result Value Ref Range Status   SARS Coronavirus 2 by RT PCR NEGATIVE NEGATIVE Final    Comment: (NOTE) SARS-CoV-2 target nucleic acids are NOT DETECTED.  The SARS-CoV-2 RNA is generally detectable in upper respiratory specimens during the acute phase of infection.  The lowest concentration of SARS-CoV-2 viral copies this assay can detect is 138 copies/mL. A negative result does not preclude SARS-Cov-2 infection and should not be used as the sole basis for treatment or other patient management decisions. A negative result may occur with  improper specimen collection/handling, submission of specimen other than nasopharyngeal swab, presence of viral mutation(s) within the areas targeted by this assay, and inadequate number of viral copies(<138 copies/mL). A negative result must be combined with clinical observations, patient history, and epidemiological information. The expected result is Negative.  Fact Sheet for Patients:  EntrepreneurPulse.com.au  Fact Sheet for Healthcare Providers:  IncredibleEmployment.be  This test is no t yet approved or cleared by the [place] FDA and  has been authorized for detection and/or diagnosis of SARS-CoV-2 by FDA under an Emergency Use Authorization (EUA). This EUA will remain  in effect (meaning this test can be used) for the duration of the COVID-19 declaration under Section 564(b)(1) of the  Act, 21 U.S.C.section 360bbb-3(b)(1), unless the authorization is terminated  or revoked sooner.       Influenza A by PCR NEGATIVE NEGATIVE Final   Influenza B by PCR NEGATIVE NEGATIVE Final    Comment: (NOTE) The Xpert Xpress SARS-CoV-2/FLU/RSV plus assay is intended as an aid in the diagnosis of influenza from Nasopharyngeal swab specimens and should not be used as a sole basis for treatment. Nasal washings and aspirates are unacceptable for Xpert Xpress SARS-CoV-2/FLU/RSV testing.  Fact Sheet for Patients: EntrepreneurPulse.com.au  Fact Sheet for Healthcare Providers: IncredibleEmployment.be  This test is not yet approved or cleared by the [place] FDA and has been authorized for detection and/or diagnosis of SARS-CoV-2 by FDA under an Emergency Use Authorization (EUA). This EUA will remain in effect (meaning this test can be used) for the duration of the COVID-19 declaration under Section 564(b)(1) of the Act, 21 U.S.C. section 360bbb-3(b)(1), unless the authorization is terminated or revoked.  Performed at [facility], [address] [place], [place] [address]          Radiology Studies: CT Head Wo Contrast  Result Date: 01/30/2021 CLINICAL DATA:  Delirium.  Fell. EXAM: CT HEAD WITHOUT CONTRAST TECHNIQUE: Contiguous axial images were obtained from the base of the skull through the vertex without intravenous contrast. COMPARISON:  02/19/2019 FINDINGS: Brain: Age related volume loss. Mild chronic small-vessel ischemic change of the hemispheric white matter. No sign of acute infarction, mass lesion, hemorrhage, hydrocephalus or extra-axial collection. Vascular: There is atherosclerotic calcification of the major vessels at the base of the brain. Skull: Normal Sinuses/Orbits: Clear/normal Other: None IMPRESSION: No acute or traumatic finding. Age related volume loss and mild chronic small-vessel ischemic changes of the  white matter. Electronically Signed   By: Nelson Chimes M.D.   On: 01/30/2021 21:27   CT ABDOMEN PELVIS W CONTRAST  Result Date: 01/30/2021 CLINICAL DATA:  Status post fall. EXAM: CT ABDOMEN AND PELVIS WITH CONTRAST TECHNIQUE: Multidetector CT imaging of the abdomen and pelvis was performed using the standard protocol following bolus administration of intravenous contrast. CONTRAST:  189mL OMNIPAQUE IOHEXOL 300 MG/ML  SOLN COMPARISON:  March 12, 2005 FINDINGS: Lower chest: An artificial cardiac valve is seen. Hepatobiliary: Multiple stable cystic appearing areas are noted within the liver. No gallstones, gallbladder wall thickening, or biliary dilatation. Pancreas: Unremarkable. No pancreatic ductal dilatation or surrounding inflammatory changes. Spleen: Normal in size without focal abnormality. Adrenals/Urinary Tract: Adrenal glands are unremarkable. Kidneys are normal in size, without obstructing renal calculi or hydronephrosis. A  1.5 cm diameter cyst is seen within the posterior aspect of the upper pole of the left kidney. A 5 mm nonobstructing renal stone is seen within the mid right kidney. Bladder is unremarkable. Stomach/Bowel: Stomach is within normal limits. Appendix appears normal. No evidence of bowel wall thickening, distention, or inflammatory changes. Noninflamed diverticula are seen within the descending and sigmoid colon. Vascular/Lymphatic: Marked severity aortic calcification. No enlarged abdominal or pelvic lymph nodes. Reproductive: There is mild to moderate severity prostate gland enlargement. Other: No abdominal wall hernia or abnormality. No abdominopelvic ascites. Musculoskeletal: Degenerative changes are seen within the lumbar spine, most prominent at the levels of L3-L4 and L5-S1. IMPRESSION: 1. Multiple stable hepatic cysts. 2. 5 mm nonobstructing renal stone within the right kidney. 3. Colonic diverticulosis. 4. Enlarged prostate gland. 5. Degenerative changes of the lumbar spine, most  prominent at the levels of L3-L4 and L5-S1. 6. Aortic atherosclerosis. Aortic Atherosclerosis (ICD10-I70.0). Electronically Signed   By: Virgina Norfolk M.D.   On: 01/30/2021 22:57   DG Chest Portable 1 View  Result Date: 01/30/2021 CLINICAL DATA:  Patient fell going to the bathroom.  Weakness. EXAM: PORTABLE CHEST 1 VIEW COMPARISON:  05/04/2018 FINDINGS: Stable changes from cardiac surgery and valve replacement. Heart is normal size. No mediastinal or hilar masses. Left anterior chest wall biventricular pacemaker is stable. Prominent bronchovascular and interstitial markings. No convincing lung consolidation or pulmonary edema. No pleural effusion or pneumothorax. Findings are similar to the prior chest radiograph. Skeletal structures are grossly intact. IMPRESSION: 1. No acute cardiopulmonary disease. 2. Chronic lung findings and chronic changes from prior cardiac surgery and valve replacement. Electronically Signed   By: Lajean Manes M.D.   On: 01/30/2021 20:52        Scheduled Meds: . enoxaparin (LOVENOX) injection  40 mg Subcutaneous Q24H  . potassium chloride  40 mEq Oral Q2H  . QUEtiapine  25 mg Oral QHS  . sodium chloride flush  3 mL Intravenous Q12H  . torsemide  40 mg Oral Daily   Continuous Infusions:   LOS: 0 days    Time spent: 38 minutes    Sharen Hones, MD [facility]   To contact the attending provider between 7A-7P or the covering provider during after hours 7P-7A, please log into the web site [email or web address] and access using universal [place] password for that web site. If you do not have the password, please call the hospital operator.  01/31/2021, 9:04 AM

## 2021-01-31 NOTE — ED Notes (Signed)
Patient's wife emptied urinal x3 of approximately 924mL urine total. Pants removed and placed in gown. External cath in place and on low suction. Patient and wife given instruction on external cath.  Patient given warm blankets.

## 2021-01-31 NOTE — Progress Notes (Signed)
Pt refused cpap

## 2021-01-31 NOTE — Progress Notes (Signed)
PT Cancellation Note  Patient Details Name: ADILSON GRAFTON MRN: [id number] DOB: 1941/03/20   Cancelled Treatment:    Reason Eval/Treat Not Completed: Patient declined, no reason specified.  Pt reports he is too fatigued to undergo PT due to OT just being in room.  Pt will be seen at later date as medically appropriate.     Gwenlyn Saran, PT, DPT 01/31/21, 4:26 PM

## 2021-01-31 NOTE — ED Notes (Signed)
Patient noted to be resting comfortably at this time. Wife states that patient is more calm and feeling better at this time.

## 2021-01-31 NOTE — ED Notes (Signed)
Patient increasingly agitated with this RN and with wife, pulling at wires, attempting to get out of bed. Patient takes 0.25-0.5 mg xanax at home for anxiety as needed. IV medication requested from NP to decrease patient's anxiety.  Patient states "get me out of here". Patient is visibly uncomfortable and hitting self on head. Wife states at night this behavior is typical for patient but has been worsening recently.

## 2021-01-31 NOTE — Evaluation (Signed)
Occupational Therapy Evaluation Patient Details Name: Michael Delacruz MRN: [id number] DOB: 06-11-1941 Today's Date: 01/31/2021    History of Present Illness Pt is 80 y/o M with PMH: Michael Delacruz is a 80 y.o. male with medical history significant of CHF, A. fib s/p Maze procedure, CAD, mitral valve prolapse s/p repair, COPD, pulmonary HTN, OSA, depression, anxiety, HLD, CVA, hypothyroidism, and insomnia. Pt presented to ED after having a fall last week and some increasing altered mentation since that time. Pt with no acute changes on CT and team unable to perform MRI as a part of patient workup d/t pt's PPM (R side). MD's note indicates some potential baseline dementia based on CT.   Clinical Impression   Pt seen for OT evaluation this date in setting of acute hospitalization with altered mental status. Pt and pt's spouse report that he is INDEP at baseline including driving and running errands and performing all I/ADLs. Pt does not use assistive device at baseline. Pt lives with his spouse in [facility] with 5 STE and b/l railing. This date, pt presents with decreased fxl activity tolerance. He is on nasal cannula and is noted to have RR in the 20s at rest and they quickly jump to 30 breaths per minute and greater with just light activity such as transition to sitting. Pt requires CGA/MIN A with bed mobility, MIN A with ADL transfers with hand held assist, and MOD A for LB dressing at this time. Will continue to follow acutely. Anticipate at this time that pt will require STR at SNF to regain strength and tolerance for fxl mobility and ADLs.     Follow Up Recommendations  SNF    Equipment Recommendations  3 in 1 bedside commode    Recommendations for Other Services       Precautions / Restrictions Precautions Precautions: Fall Restrictions Weight Bearing Restrictions: No      Mobility Bed Mobility Overal bed mobility: Needs Assistance Bed Mobility: Supine to Sit;Sit to Supine      Supine to sit: Min guard Sit to supine: Min assist   General bed mobility comments: assist with LEs for back to bed    Transfers Overall transfer level: Needs assistance Equipment used: 1 person hand held assist Transfers: Sit to/from Stand Sit to Stand: Min assist         General transfer comment: cues for safety/sequencing, pt somewhat impulsive, but believe it's more r/t not understandinf request rather than behavioral.    Balance Overall balance assessment: Needs assistance   Sitting balance-Leahy Scale: Good       Standing balance-Leahy Scale: Poor Standing balance comment: requires UE support at least unilaterally, requires external support of MIN A, and is noted to brace back of LEs on bed                           ADL either performed or assessed with clinical judgement   ADL Overall ADL's : Needs assistance/impaired                                       General ADL Comments: Requires SETUP to MIN A for seated UB ADLs such as bathing/dressing and requires MOD/MAX A for LB ADLs-MOD A to thread socks and MAX A for posterior peri care in standing. Multimodal cues as pt with difficulty sequencing 2/2 cognition  Vision Patient Visual Report: No change from baseline       Perception     Praxis      Pertinent Vitals/Pain Pain Assessment: Faces Faces Pain Scale: Hurts little more Pain Location: back/neck Pain Descriptors / Indicators: Aching;Sore Pain Intervention(s): Limited activity within patient's tolerance;Monitored during session     Hand Dominance Right   Extremity/Trunk Assessment Upper Extremity Assessment Upper Extremity Assessment: Generalized weakness   Lower Extremity Assessment Lower Extremity Assessment: Generalized weakness       Communication Communication Communication: No difficulties   Cognition Arousal/Alertness: Awake/alert Behavior During Therapy: WFL for tasks assessed/performed Overall  Cognitive Status: Impaired/Different from baseline Area of Impairment: Orientation;Memory;Following commands;Safety/judgement;Problem solving                 Orientation Level: Disoriented to;Place;Time;Situation   Memory: Decreased short-term memory Following Commands: Follows one step commands inconsistently;Follows one step commands with increased time Safety/Judgement: Decreased awareness of safety;Decreased awareness of deficits   Problem Solving: Slow processing;Decreased initiation;Difficulty sequencing;Requires verbal cues;Requires tactile cues General Comments: Pt is pleasant and cooperative, but has difficulty with motor planning and sequencing and just generally following one step commnads. Follows ~75% of commands, but requires tactile cues to perform correctly requested task (ex: tries to stand up when prompted to laterally scoot)   General Comments       Exercises Other Exercises Other Exercises: OT facilitates ed with pt and spouse re: role of OT in acute setting. Pt with poor carryover of education at this time as his short term memory is limited 2/2 AMS, but pt's spouse with good reception.   Shoulder Instructions      Home Living Family/patient expects to be discharged to:: Private residence Living Arrangements: Spouse/significant other Available Help at Discharge: Family;Available PRN/intermittently Type of Home: House Home Access: Stairs to enter [employer] of Steps: 5 Entrance Stairs-Rails: Right;Left;Can reach both Home Layout: One level               Home Equipment: Walker - 2 wheels;Shower seat          Prior Functioning/Environment Level of Independence: Independent        Comments: INDEP with fxl mobility at baseline; does not use the AD he has-got them after heart surgery. Pt was also driving and performing all I/ADLs I'ly at baseline. Pt's spouse reports he's been needing more assistance in the last 2 or so weeks.         OT Problem List: Decreased strength;Impaired balance (sitting and/or standing);Decreased activity tolerance;Decreased cognition;Decreased safety awareness;Decreased knowledge of use of DME or AE;Cardiopulmonary status limiting activity      OT Treatment/Interventions: Self-care/ADL training;DME and/or AE instruction;Therapeutic activities;Balance training;Therapeutic exercise;Energy conservation;Patient/family education    OT Goals(Current goals can be found in the care plan section) Acute Rehab OT Goals Patient Stated Goal: to get my breathing better and be able to get up OT Goal Formulation: With patient/family Time For Goal Achievement: 02/14/21 Potential to Achieve Goals: Good ADL Goals Pt Will Perform Lower Body Bathing: with min guard assist;with min assist;sit to/from stand (with LRAD) Pt Will Perform Lower Body Dressing: with supervision;with min guard assist;sit to/from stand (with AE PRN) Pt Will Transfer to Toilet: with supervision;with min guard assist;ambulating;bedside commode (with LRAD to [facility] ~10-15' away to improve fxl mobility tolerance.) Pt/caregiver will Perform Home Exercise Program: Increased strength;Both right and left upper extremity;With minimal assist  OT Frequency: Min 1X/week   Barriers to D/C:  Co-evaluation              AM-PAC OT "6 Clicks" Daily Activity     Outcome Measure Help from another person eating meals?: None Help from another person taking care of personal grooming?: A Little Help from another person toileting, which includes using toliet, bedpan, or urinal?: A Lot Help from another person bathing (including washing, rinsing, drying)?: A Lot Help from another person to put on and taking off regular upper body clothing?: A Little Help from another person to put on and taking off regular lower body clothing?: A Lot 6 Click Score: 16   End of Session Equipment Utilized During Treatment: Gait belt;Rolling walker;Oxygen Nurse  Communication: Mobility status;Other (comment) (notified of pt bath during session)  Activity Tolerance: Patient tolerated treatment well Patient left: in bed;with call bell/phone within reach;with bed alarm set  OT Visit Diagnosis: Unsteadiness on feet (R26.81)                Time: 5681-2751 OT Time Calculation (min): 61 min Charges:  OT General Charges $OT Visit: 1 Visit OT Evaluation $OT Eval Moderate Complexity: 1 Mod OT Treatments $Self Care/Home Management : 23-37 mins $Therapeutic Activity: 23-37 mins  Gerrianne Scale, MS, OTR/L ascom [phone] 01/31/21, 6:38 PM

## 2021-01-31 NOTE — Progress Notes (Signed)
Initial Nutrition Assessment  DOCUMENTATION CODES:   Not applicable  INTERVENTION:  Ensure Enlive po BID, each supplement provides 350 kcal and 20 grams of protein  MVI with minerals daily  Pt is at risk for refeeding as noted below, recommend monitoring magnesium, potassium, and phosphorus daily for at least 3 days, MD to replete as needed   NUTRITION DIAGNOSIS:   Inadequate oral intake related to poor appetite,lethargy/confusion as evidenced by per patient/family report.    GOAL:   Patient will meet greater than or equal to 90% of their needs    MONITOR:   Labs,I & O's,Supplement acceptance,PO intake,Weight trends,Skin  REASON FOR ASSESSMENT:   Malnutrition Screening Tool    ASSESSMENT:  80 year old male admitted with AMS. Past medical history significant of CHF, Afib s/p Maze procedure, CAD, MV prolapse s/p repair, COPD, pulmonary hypertension, OSA, depression, anxiety, HLD, CVA, hypothyroidism, insomnia, chronic back and leg pain with ambulatory difficulties and recent Covid infection presented with confusion and poor appetite s/p fall approximately 10 days ago.   RD working remotely.  Attempted to contact pt via phone this afternoon, however no answer. There are no documented meals for review at this time. Per notes, he reports poor appetite over the last 10 days and is at risk for refeeding. Recommend monitoring magnesium, potassium, and phosphorus daily for at least 3 days, MD to replete as needed. Pt currently receiving Heart Healthy diet which is restrictive of protein. RD will order Ensure supplement as well as daily MVI to help him meet his needs and will plan to obtain nutrition history at follow-up.  Per chart, weights up ~20 lbs from his usual weight. Suspect weight increase secondary to fluid. Per notes, he was recently directed to increase Lasix dose by cardiologist s/p data from pacemaker indicated volume overload. Will use usual weight (88.5 kg) for  estimating needs.   Medications reviewed and include: Klor-con, Seroquel, Demadex 40 mg daily  Labs: K 3.1 (L), AST 100 (H), ALT 77 (H) 2/11- Mg 2.6 (H), BNP 242 (H)  NUTRITION - FOCUSED PHYSICAL EXAM:  Unable to complete at this time  Diet Order:   Diet Order            Diet Heart Room service appropriate? Yes; Fluid consistency: Thin  Diet effective now                 EDUCATION NEEDS:   No education needs have been identified at this time  Skin:  Skin Assessment: Reviewed RN Assessment  Last BM:  2/10  Height:   Ht Readings from Last 1 Encounters:  01/30/21 5\' 11"  (1.803 m)    Weight:   Wt Readings from Last 1 Encounters:  01/31/21 98.4 kg    Ideal Body Weight:     BMI:  Body mass index is 30.27 kg/m.  Estimated Nutritional Needs:   Kcal:  3662-9476  Protein:  120-135  Fluid:  2.2 L/day   Lajuan Lines, RD, LDN Clinical Nutrition After Hours/Weekend Pager # in [phone]

## 2021-02-01 DIAGNOSIS — R627 Adult failure to thrive: Secondary | ICD-10-CM

## 2021-02-01 LAB — BASIC METABOLIC PANEL
Anion gap: 11 (ref 5–15)
BUN: 15 mg/dL (ref 8–23)
CO2: 26 mmol/L (ref 22–32)
Calcium: 9.1 mg/dL (ref 8.9–10.3)
Chloride: 100 mmol/L (ref 98–111)
Creatinine, Ser: 1.09 mg/dL (ref 0.61–1.24)
GFR, Estimated: >60 mL/min (ref >60)
Glucose, Bld: 142 mg/dL — ABNORMAL HIGH (ref 70–99)
Potassium: 3.5 mmol/L (ref 3.5–5.1)
Sodium: 137 mmol/L (ref 135–145)

## 2021-02-01 LAB — CBC WITH DIFFERENTIAL/PLATELET
Abs Immature Granulocytes: 0.05 10*3/uL (ref 0.00–0.07)
Basophils Absolute: 0 10*3/uL (ref 0.0–0.1)
Basophils Relative: 0 %
Eosinophils Absolute: 0.1 10*3/uL (ref 0.0–0.5)
Eosinophils Relative: 1 %
HCT: 39.6 % (ref 39.0–52.0)
Hemoglobin: 14.1 g/dL (ref 13.0–17.0)
Immature Granulocytes: 1 %
Lymphocytes Relative: 13 %
Lymphs Abs: 0.9 10*3/uL (ref 0.7–4.0)
MCH: 31.8 pg (ref 26.0–34.0)
MCHC: 35.6 g/dL (ref 30.0–36.0)
MCV: 89.4 fL (ref 80.0–100.0)
Monocytes Absolute: 0.9 10*3/uL (ref 0.1–1.0)
Monocytes Relative: 13 %
Neutro Abs: 5.2 10*3/uL (ref 1.7–7.7)
Neutrophils Relative %: 72 %
Platelets: 302 10*3/uL (ref 150–400)
RBC: 4.43 MIL/uL (ref 4.22–5.81)
RDW: 13 % (ref 11.5–15.5)
WBC: 7.3 10*3/uL (ref 4.0–10.5)
nRBC: 0 % (ref 0.0–0.2)

## 2021-02-01 LAB — MAGNESIUM: Magnesium: 2.2 mg/dL (ref 1.7–2.4)

## 2021-02-01 MED ORDER — POTASSIUM CHLORIDE 20 MEQ PO PACK
40.0000 meq | PACK | Freq: Once | ORAL | Status: AC
  Administered 2021-02-01: 40 meq via ORAL
  Filled 2021-02-01: qty 2

## 2021-02-01 NOTE — Progress Notes (Signed)
PROGRESS NOTE    Michael Delacruz  [facility]:[id number] DOB: 1941-08-17 DOA: 01/30/2021 PCP: [name], DO   Chief complaint.  Altered mental status. Brief Narrative:  Michael Delacruz a 80 y.o.malewith medical history significant ofCHF, A. fib status post Maze procedure, CAD, mitral valve prolapse status post repair, COPD, pulmonary hypertension, OSA, depression, anxiety, hyperlipidemia, CVA, hypothyroidism, insomnia who presents after having a fall last week and some increasing altered mentation since that time. Patient has back pain and leg pain with difficulty walking for about a year.  Since had a Covid, patient had increased weakness.  About 10 days ago, he had a fall.  Since that time, patient has been having difficulty with sleeping, more confused.  He also has very poor appetite, he drinks water, but does not eat much. CT scan of the head did not show any acute changes.  Patient has pacemaker, not able to perform MRI. It is determined that the patient condition was multifactorial with recent Covid infection, obstruct sleep apnea and sleep deprivation.  He is placed on Seroquel 2/12, he slept better, condition improving.    Assessment & Plan:   Principal Problem:   Altered mental status Active Problems:   Pulmonary HTN (HCC)   Hyperlipidemia   CAD (coronary artery disease)   Centrilobular emphysema (HCC)   Systolic CHF, chronic (HCC)   S/P Minimally invasive maze operation for atrial fibrillation   S/P MVR (mitral valve repair)   Generalized anxiety disorder   OSA (obstructive sleep apnea)   History of stroke   Hypothyroidism   CHB (complete heart block) (HCC)   Encephalopathy  #1.  Encephalopathy. Insomnia Objective sleep apnea. It appears that patient condition is mainly from sleep deprivation.  He slept better after giving Seroquel.  I spoke with his wife, they talk on the phone, patient mental status appears to be improved significantly compared to  yesterday.  I will keep patient 1 more night, continue Seroquel.  Most likely discharge home tomorrow.  2.  Chronic systolic congestive heart failure. Continue oral torsemide.  3.  COPD. Pulmonary hypertension. Stable.  4.  Persistent atrial fibrillation status post maze procedure. No recurrence of atrial fibrillation.  5.  Failure to thrive. Patient has been evaluated by occupational therapy, pending physical therapy, recommend SNF placement.  Discussed with patient wife, family does not want patient to go to nursing home.  We will set up home care and home physical therapy after discharge.  6.  Hypokalemia. Improved.  Potassium is 3.5, will give another dose of oral supplement.    DVT prophylaxis: Lovenox Code Status: Full Family Communication: wife updated Disposition Plan:  .   Status is: Inpatient  Remains inpatient appropriate because:Inpatient level of care appropriate due to severity of illness   Dispo: The patient is from: Home              Anticipated d/c is to: Home              Anticipated d/c date is: 1 day              Patient currently is not medically stable to d/c.   Difficult to place patient No        I/O last 3 completed shifts: In: 3 [I.V.:3] Out: 950 [Urine:950] No intake/output data recorded.     Consultants:   None  Procedures: None  Antimicrobials: None  Subjective: Mental status seem to be better this morning.  He slept well last night.  He talked his wife over the phone this morning, he appears he is more coherent.  Patient is also decided to watch Super Bowl tonight.  He knows today is Sunday. Denies any short of breath or cough. No abdominal pain or nausea vomiting. No fever chills pain No headache or dizziness.  Objective: Vitals:   01/31/21 1630 01/31/21 2144 02/01/21 0335 02/01/21 0743  BP: 130/69 122/76 125/82 (!) 149/79  Pulse: 75 79 82 80  Resp: 16 18 18 19   Temp: 98 F (36.7 C) 97.7 F (36.5 C) 97.8 F (36.6  C) 98.4 F (36.9 C)  TempSrc: Oral Oral    SpO2: 98% 98% 97% 95%  Weight:      Height:        Intake/Output Summary (Last 24 hours) at 02/01/2021 0906 Last data filed at 01/31/2021 1400 Gross per 24 hour  Intake 3 ml  Output 650 ml  Net -647 ml   Filed Weights   01/30/21 1957 01/31/21 0452  Weight: 89.4 kg 98.4 kg    Examination:  General exam: Appears calm and comfortable  Respiratory system: Clear to auscultation. Respiratory effort normal. Cardiovascular system: S1 & S2 heard, RRR. No JVD, murmurs, rubs, gallops or clicks. No pedal edema. Gastrointestinal system: Abdomen is nondistended, soft and nontender. No organomegaly or masses felt. Normal bowel sounds heard. Central nervous system: Alert and oriented. No focal neurological deficits. Extremities: Symmetric 5 x 5 power. Skin: No rashes, lesions or ulcers Psychiatry: Judgement and insight appear normal. Mood & affect appropriate.     Data Reviewed: I have personally reviewed following labs and imaging studies  CBC: Recent Labs  Lab 01/30/21 2052 01/31/21 0532 02/01/21 0442  WBC 5.6 7.0 7.3  NEUTROABS  --   --  5.2  HGB 14.1 14.1 14.1  HCT 40.7 40.3 39.6  MCV 88.3 88.4 89.4  PLT 320 310 509   Basic Metabolic Panel: Recent Labs  Lab 01/30/21 2052 01/31/21 0532 02/01/21 0442  NA 135 138 137  K 3.9 3.1* 3.5  CL 97* 99 100  CO2 27 24 26   GLUCOSE 148* 130* 142*  BUN 14 14 15   CREATININE 0.98 0.91 1.09  CALCIUM 9.4 9.2 9.1  MG 2.6*  --  2.2   GFR: Estimated Creatinine Clearance: 67.8 mL/min (by C-G formula based on SCr of 1.09 mg/dL). Liver Function Tests: Recent Labs  Lab 01/30/21 2052 01/31/21 0532  AST 99* 100*  ALT 73* 77*  ALKPHOS 84 81  BILITOT 1.1 1.1  PROT 7.1 6.7  ALBUMIN 3.3* 3.2*   No results for input(s): LIPASE, AMYLASE in the last 168 hours. No results for input(s): AMMONIA in the last 168 hours. Coagulation Profile: No results for input(s): INR, PROTIME in the last 168  hours. Cardiac Enzymes: No results for input(s): CKTOTAL, CKMB, CKMBINDEX, TROPONINI in the last 168 hours. BNP (last 3 results) No results for input(s): PROBNP in the last 8760 hours. HbA1C: No results for input(s): HGBA1C in the last 72 hours. CBG: No results for input(s): GLUCAP in the last 168 hours. Lipid Profile: No results for input(s): CHOL, HDL, LDLCALC, TRIG, CHOLHDL, LDLDIRECT in the last 72 hours. Thyroid Function Tests: Recent Labs    01/30/21 2356  TSH 1.029   Anemia Panel: No results for input(s): VITAMINB12, FOLATE, FERRITIN, TIBC, IRON, RETICCTPCT in the last 72 hours. Sepsis Labs: No results for input(s): PROCALCITON, LATICACIDVEN in the last 168 hours.  Recent Results (from the past 240 hour(s))  Resp Panel by RT-PCR (  Flu A&B, Covid) Nasopharyngeal Swab     Status: None   Collection Time: 01/30/21  8:32 PM   Specimen: Nasopharyngeal Swab; Nasopharyngeal(NP) swabs in vial transport medium  Result Value Ref Range Status   SARS Coronavirus 2 by RT PCR NEGATIVE NEGATIVE Final    Comment: (NOTE) SARS-CoV-2 target nucleic acids are NOT DETECTED.  The SARS-CoV-2 RNA is generally detectable in upper respiratory specimens during the acute phase of infection. The lowest concentration of SARS-CoV-2 viral copies this assay can detect is 138 copies/mL. A negative result does not preclude SARS-Cov-2 infection and should not be used as the sole basis for treatment or other patient management decisions. A negative result may occur with  improper specimen collection/handling, submission of specimen other than nasopharyngeal swab, presence of viral mutation(s) within the areas targeted by this assay, and inadequate number of viral copies(<138 copies/mL). A negative result must be combined with clinical observations, patient history, and epidemiological information. The expected result is Negative.  Fact Sheet for Patients:   EntrepreneurPulse.com.au  Fact Sheet for Healthcare Providers:  IncredibleEmployment.be  This test is no t yet approved or cleared by the [place] FDA and  has been authorized for detection and/or diagnosis of SARS-CoV-2 by FDA under an Emergency Use Authorization (EUA). This EUA will remain  in effect (meaning this test can be used) for the duration of the COVID-19 declaration under Section 564(b)(1) of the Act, 21 U.S.C.section 360bbb-3(b)(1), unless the authorization is terminated  or revoked sooner.       Influenza A by PCR NEGATIVE NEGATIVE Final   Influenza B by PCR NEGATIVE NEGATIVE Final    Comment: (NOTE) The Xpert Xpress SARS-CoV-2/FLU/RSV plus assay is intended as an aid in the diagnosis of influenza from Nasopharyngeal swab specimens and should not be used as a sole basis for treatment. Nasal washings and aspirates are unacceptable for Xpert Xpress SARS-CoV-2/FLU/RSV testing.  Fact Sheet for Patients: EntrepreneurPulse.com.au  Fact Sheet for Healthcare Providers: IncredibleEmployment.be  This test is not yet approved or cleared by the [place] FDA and has been authorized for detection and/or diagnosis of SARS-CoV-2 by FDA under an Emergency Use Authorization (EUA). This EUA will remain in effect (meaning this test can be used) for the duration of the COVID-19 declaration under Section 564(b)(1) of the Act, 21 U.S.C. section 360bbb-3(b)(1), unless the authorization is terminated or revoked.  Performed at [facility], [address], [place], [place] [address]          Radiology Studies: CT Head Wo Contrast  Result Date: 01/30/2021 CLINICAL DATA:  Delirium.  Fell. EXAM: CT HEAD WITHOUT CONTRAST TECHNIQUE: Contiguous axial images were obtained from the base of the skull through the vertex without intravenous contrast. COMPARISON:  02/19/2019 FINDINGS: Brain: Age  related volume loss. Mild chronic small-vessel ischemic change of the hemispheric white matter. No sign of acute infarction, mass lesion, hemorrhage, hydrocephalus or extra-axial collection. Vascular: There is atherosclerotic calcification of the major vessels at the base of the brain. Skull: Normal Sinuses/Orbits: Clear/normal Other: None IMPRESSION: No acute or traumatic finding. Age related volume loss and mild chronic small-vessel ischemic changes of the white matter. Electronically Signed   By: Nelson Chimes M.D.   On: 01/30/2021 21:27   CT ABDOMEN PELVIS W CONTRAST  Result Date: 01/30/2021 CLINICAL DATA:  Status post fall. EXAM: CT ABDOMEN AND PELVIS WITH CONTRAST TECHNIQUE: Multidetector CT imaging of the abdomen and pelvis was performed using the standard protocol following bolus administration of intravenous contrast. CONTRAST:  162mL OMNIPAQUE IOHEXOL 300 MG/ML  SOLN COMPARISON:  March 12, 2005 FINDINGS: Lower chest: An artificial cardiac valve is seen. Hepatobiliary: Multiple stable cystic appearing areas are noted within the liver. No gallstones, gallbladder wall thickening, or biliary dilatation. Pancreas: Unremarkable. No pancreatic ductal dilatation or surrounding inflammatory changes. Spleen: Normal in size without focal abnormality. Adrenals/Urinary Tract: Adrenal glands are unremarkable. Kidneys are normal in size, without obstructing renal calculi or hydronephrosis. A 1.5 cm diameter cyst is seen within the posterior aspect of the upper pole of the left kidney. A 5 mm nonobstructing renal stone is seen within the mid right kidney. Bladder is unremarkable. Stomach/Bowel: Stomach is within normal limits. Appendix appears normal. No evidence of bowel wall thickening, distention, or inflammatory changes. Noninflamed diverticula are seen within the descending and sigmoid colon. Vascular/Lymphatic: Marked severity aortic calcification. No enlarged abdominal or pelvic lymph nodes. Reproductive: There  is mild to moderate severity prostate gland enlargement. Other: No abdominal wall hernia or abnormality. No abdominopelvic ascites. Musculoskeletal: Degenerative changes are seen within the lumbar spine, most prominent at the levels of L3-L4 and L5-S1. IMPRESSION: 1. Multiple stable hepatic cysts. 2. 5 mm nonobstructing renal stone within the right kidney. 3. Colonic diverticulosis. 4. Enlarged prostate gland. 5. Degenerative changes of the lumbar spine, most prominent at the levels of L3-L4 and L5-S1. 6. Aortic atherosclerosis. Aortic Atherosclerosis (ICD10-I70.0). Electronically Signed   By: Virgina Norfolk M.D.   On: 01/30/2021 22:57   DG Chest Portable 1 View  Result Date: 01/30/2021 CLINICAL DATA:  Patient fell going to the bathroom.  Weakness. EXAM: PORTABLE CHEST 1 VIEW COMPARISON:  05/04/2018 FINDINGS: Stable changes from cardiac surgery and valve replacement. Heart is normal size. No mediastinal or hilar masses. Left anterior chest wall biventricular pacemaker is stable. Prominent bronchovascular and interstitial markings. No convincing lung consolidation or pulmonary edema. No pleural effusion or pneumothorax. Findings are similar to the prior chest radiograph. Skeletal structures are grossly intact. IMPRESSION: 1. No acute cardiopulmonary disease. 2. Chronic lung findings and chronic changes from prior cardiac surgery and valve replacement. Electronically Signed   By: Lajean Manes M.D.   On: 01/30/2021 20:52        Scheduled Meds: . enoxaparin (LOVENOX) injection  40 mg Subcutaneous Q24H  . feeding supplement  237 mL Oral BID BM  . multivitamin with minerals  1 tablet Oral Daily  . potassium chloride  40 mEq Oral Once  . QUEtiapine  25 mg Oral QHS  . sodium chloride flush  3 mL Intravenous Q12H  . torsemide  40 mg Oral Daily   Continuous Infusions:   LOS: 1 day    Time spent: 28 minutes    Sharen Hones, MD [facility]   To contact the attending provider between  7A-7P or the covering provider during after hours 7P-7A, please log into the web site [email or web address] and access using universal [place] password for that web site. If you do not have the password, please call the hospital operator.  02/01/2021, 9:06 AM

## 2021-02-01 NOTE — Progress Notes (Signed)
Mobility Specialist - Progress Note   02/01/21 1300  Mobility  Activity Transferred to/from [facility]  Level of Assistance Minimal assist, patient does 75% or more  Assistive Device BSC  Distance Ambulated (ft) 2 ft  Mobility Response Tolerated well  Mobility performed by Mobility specialist  $Mobility charge 1 Mobility    Pre-mobility: 84 HR, 93% SpO2 Post-mobility: 80 HR, 94% SpO2   Pt was attempting to stand from recliner with leg rest still up, upon arrival. Pt agreeable to assistance. Spouse in room during session. Pt possibly confused, but he would only answer AO questions correctly when wife gets on him about "joking too much". Pt utilizing room air. Mobility lowered leg rest for easy transfer and pt stood with minA. No AD in use. Pt requiring 2-hand assist and verbal cueing for sequencing to safely complete transfer. Pt pivoted to [facility] where he was successful in urinal output. Upon sitting on BSC, pt's O2 briefly desat to a low of 90% but rebounds quickly to mid-90s. Pt does c/o SOB throughout transfer with heavy breathing noted. Pt educated on PLB technique, but does not carry over well during activity. Overall, pt tolerated well. Pt was left in recliner with all needs in reach and alarm set. Nurse notified.    Kathee Delton Mobility Specialist 02/01/21, 1:55 PM

## 2021-02-01 NOTE — Evaluation (Signed)
Physical Therapy Evaluation Patient Details Name: Michael Delacruz MRN: [id number] DOB: September 24, 1941 Today's Date: 02/01/2021   History of Present Illness  Pt is 80 y/o M with PMH: Michael Delacruz is a 80 y.o. male with medical history significant of CHF, A. fib s/p Maze procedure, CAD, mitral valve prolapse s/p repair, COPD, pulmonary HTN, OSA, depression, anxiety, HLD, CVA, hypothyroidism, and insomnia. Pt presented to ED after having a fall last week and some increasing altered mentation since that time. Pt with no acute changes on CT and team unable to perform MRI as a part of patient workup d/t pt's PPM (R side). MD's note indicates some potential baseline dementia based on CT.  Clinical Impression  Upon evaluation, patient alert and oriented to self, general location; follows simple commands approx 75% time. Noted difficulty with processing, task initiation and sequencing at times.  Patient endorses history of mild hemiparesis R UE/LE; no significant residual appreciated.  Does endorse changes in vision after CVA; unable to elaborate.  Currently requiring min assist for sit/stand, basic transfers and gait (200') with RW, cga/min assist.  Demonstrates shuffling stepping pattern; inconsistent step height/length; difficulty negotiating RW around stationary obstacles and turns (min assist from therapist).  Limited balance reactions evident; recommend continued use of RW and +1 assist at all times. Sats >93% on RA thoughout session;  RR 15-17 at rest, 20s with activity. Would benefit from skilled PT to address above deficits and promote optimal return to PLOF.; Recommend transition to HHPT and 24/7 supervision/assist upon discharge from acute hospitalization.    Follow Up Recommendations Home health PT;Supervision/Assistance - 24 hour    Equipment Recommendations  Rolling walker with 5" wheels;3in1 (PT)    Recommendations for Other Services       Precautions / Restrictions  Precautions Precautions: Fall Restrictions Weight Bearing Restrictions: No      Mobility  Bed Mobility Overal bed mobility: Modified Independent                  Transfers Overall transfer level: Needs assistance Equipment used: Rolling walker (2 wheeled) Transfers: Sit to/from Stand Sit to Stand: Min assist         General transfer comment: cuing for hand placement, tends to pull on RW  Ambulation/Gait Ambulation/Gait assistance: Min guard;Min assist Gait Distance (Feet): 200 Feet Assistive device: Rolling walker (2 wheeled)       General Gait Details: shuffling stepping pattern; inconsistent step height/length; difficulty negotiating RW around stationary obstacles and turns (min assist from therapist).  Limited balance reactions evident; recommend continued use of RW and +1 assist at all times.  Stairs            Wheelchair Mobility    Modified Rankin (Stroke Patients Only)       Balance Overall balance assessment: Needs assistance Sitting-balance support: No upper extremity supported;Feet supported Sitting balance-Leahy Scale: Good     Standing balance support: Bilateral upper extremity supported Standing balance-Leahy Scale: Poor Standing balance comment: requires UE support and +1 assist                             Pertinent Vitals/Pain Pain Assessment: No/denies pain    Home Living Family/patient expects to be discharged to:: Private residence Living Arrangements: Spouse/significant other Available Help at Discharge: Family;Available PRN/intermittently Type of Home: House Home Access: Stairs to enter Entrance Stairs-Rails: Right;Left;Can reach both Entrance Stairs-Number of Steps: 5  Prior Function Level of Independence: Independent         Comments: INDEP with fxl mobility at baseline; does not use the AD he has-got them after heart surgery. Pt was also driving and performing all I/ADLs I'ly at baseline.  Pt's spouse reports he's been needing more assistance in the last 2 or so weeks.     Hand Dominance   Dominant Hand: Right    Extremity/Trunk Assessment   Upper Extremity Assessment Upper Extremity Assessment: Overall WFL for tasks assessed (grossly 4/5 throughout)    Lower Extremity Assessment Lower Extremity Assessment: Overall WFL for tasks assessed (grossly 4/5 throughout)       Communication   Communication: No difficulties  Cognition Arousal/Alertness: Awake/alert Behavior During Therapy: WFL for tasks assessed/performed                                   General Comments: pleasant and cooperative, follows simple commands approx 75% time; difficulty with motor planning and sequencing; limited/no insight into deficits      General Comments      Exercises Other Exercises Other Exercises: Reviewed role of PT and progressive mobility; educated in safe technique with transfers, RW management.  Patient voiced undersatnding; question full understanding and integration   Assessment/Plan    PT Assessment Patient needs continued PT services  PT Problem List Decreased activity tolerance;Decreased balance;Decreased mobility;Decreased cognition;Decreased knowledge of use of DME;Decreased safety awareness;Decreased knowledge of precautions;Cardiopulmonary status limiting activity       PT Treatment Interventions DME instruction;Gait training;Stair training;Functional mobility training;Balance training;Therapeutic activities;Cognitive remediation;Therapeutic exercise;Patient/family education    PT Goals (Current goals can be found in the Care Plan section)  Acute Rehab PT Goals Patient Stated Goal: to call my wife PT Goal Formulation: With patient Time For Goal Achievement: 02/15/21 Potential to Achieve Goals: Good    Frequency Min 2X/week   Barriers to discharge        Co-evaluation               AM-PAC PT "6 Clicks" Mobility  Outcome Measure  Help needed turning from your back to your side while in a flat bed without using bedrails?: None Help needed moving from lying on your back to sitting on the side of a flat bed without using bedrails?: None Help needed moving to and from a bed to a chair (including a wheelchair)?: A Little Help needed standing up from a chair using your arms (e.g., wheelchair or bedside chair)?: A Little Help needed to walk in hospital room?: A Little Help needed climbing 3-5 steps with a railing? : A Little 6 Click Score: 20    End of Session Equipment Utilized During Treatment: Gait belt Activity Tolerance: Patient tolerated treatment well Patient left: in chair;with call bell/phone within reach;with chair alarm set Nurse Communication: Mobility status PT Visit Diagnosis: Muscle weakness (generalized) (M62.81);Difficulty in walking, not elsewhere classified (R26.2)    Time: 9767-3419 PT Time Calculation (min) (ACUTE ONLY): 25 min   Charges:   PT Evaluation $PT Eval Moderate Complexity: 1 Mod PT Treatments $Therapeutic Activity: 8-22 mins      Aalivia Mcgraw H. Owens Shark, PT, DPT, NCS 02/01/21, 9:25 AM [phone]

## 2021-02-02 LAB — BLOOD GAS, VENOUS
Acid-Base Excess: 3.7 mmol/L — ABNORMAL HIGH (ref 0.0–2.0)
Bicarbonate: 26.3 mmol/L (ref 20.0–28.0)
O2 Saturation: 94.8 %
Patient temperature: 37
pCO2, Ven: 33 mmHg — ABNORMAL LOW (ref 44.0–60.0)
pH, Ven: 7.51 — ABNORMAL HIGH (ref 7.250–7.430)
pO2, Ven: 67 mmHg — ABNORMAL HIGH (ref 32.0–45.0)

## 2021-02-02 LAB — BASIC METABOLIC PANEL
Anion gap: 13 (ref 5–15)
BUN: 18 mg/dL (ref 8–23)
CO2: 26 mmol/L (ref 22–32)
Calcium: 9.6 mg/dL (ref 8.9–10.3)
Chloride: 100 mmol/L (ref 98–111)
Creatinine, Ser: 1.22 mg/dL (ref 0.61–1.24)
GFR, Estimated: >60 mL/min (ref >60)
Glucose, Bld: 125 mg/dL — ABNORMAL HIGH (ref 70–99)
Potassium: 3.6 mmol/L (ref 3.5–5.1)
Sodium: 139 mmol/L (ref 135–145)

## 2021-02-02 LAB — MAGNESIUM: Magnesium: 2.6 mg/dL — ABNORMAL HIGH (ref 1.7–2.4)

## 2021-02-02 MED ORDER — QUETIAPINE FUMARATE 25 MG PO TABS: 25.0000 mg | ORAL_TABLET | Freq: Every day | ORAL | 0 refills | Status: DC

## 2021-02-02 MED ORDER — FLEET ENEMA 7-19 GM/118ML RE ENEM
1.0000 | ENEMA | Freq: Once | RECTAL | Status: AC
  Administered 2021-02-02: 14:00:00 1 via RECTAL

## 2021-02-02 MED ORDER — FLEET ENEMA 7-19 GM/118ML RE ENEM: 1.0000 | ENEMA | Freq: Once | RECTAL | Status: DC

## 2021-02-02 MED ORDER — LACTULOSE 10 GM/15ML PO SOLN
20.0000 g | Freq: Once | ORAL | Status: AC
  Administered 2021-02-02: 13:00:00 20 g via ORAL
  Filled 2021-02-02: qty 30

## 2021-02-02 MED ORDER — POLYETHYLENE GLYCOL 3350 17 G PO PACK
17.0000 g | PACK | Freq: Every day | ORAL | Status: DC
  Administered 2021-02-02: 09:00:00 17 g via ORAL
  Filled 2021-02-02: qty 1

## 2021-02-02 NOTE — Progress Notes (Signed)
Remote pacemaker transmission.   

## 2021-02-02 NOTE — TOC Initial Note (Signed)
Transition of Care [facility]) - Initial/Assessment Note    Patient Details  Name: Michael Delacruz MRN: [id number] Date of Birth: 1941-12-02  [facility] of [facility] CM/SW Contact:    Allayne Butcher, RN Phone Number: 02/02/2021, 11:01 AM  Clinical Narrative:                 Patient admitted to the hospital with altered mental status and CHF exacerbation.  RNCM met with patient at the bedside.   Patient would like to go home today.  MD has placed discharge order but patietn needs to have a bowel movement before going home.  RNCM spoke with patient's wife via phone.  Patient agrees to home health services and wife reports that a family member has used [facility] in the past so they would like to use [employer].  [place] with [facility] accepted referral for RN, PT, and OT.  Patient's wife Michael Delacruz will pick him up today.  Patient has a walker at home, declines 3 in 1.    Expected Discharge Plan: Home w Home Health Services Barriers to Discharge: Barriers Resolved   Patient Goals and CMS Choice Patient states their goals for this hospitalization and ongoing recovery are:: Patient wants to get out of here today CMS Medicare.gov Compare Post Acute Care list provided to:: Patient Represenative (must comment) Choice offered to / list presented to : Spouse,Patient  Expected Discharge Plan and Services Expected Discharge Plan: Home w Home Health Services   Discharge Planning Services: CM Consult Post Acute Care Choice: Home Health Living arrangements for the past 2 months: Single Family Home Expected Discharge Date: 02/02/21               DME Arranged: N/A         HH Arranged: RN,PT,OT HH Agency: [facility] Date HH Agency Contacted: 02/02/21 Time HH Agency Contacted: 1054 Representative spoke with at [facility] Agency: [place]  Prior Living Arrangements/Services Living arrangements for the past 2 months: Single Family Home Lives with:: Spouse,Adult Children Patient language and need for  interpreter reviewed:: Yes Do you feel safe going back to the place where you live?: Yes      Need for Family Participation in Patient Care: Yes (Comment) (CHF) Care giver support system in place?: Yes (comment) (wife and daughter) Current home services: DME (walker) Criminal Activity/Legal Involvement Pertinent to Current Situation/Hospitalization: No - Comment as needed  Activities of Daily Living Home Assistive Devices/Equipment: None ADL Screening (condition at time of admission) Patient's cognitive ability adequate to safely complete daily activities?: No Is the patient deaf or have difficulty hearing?: No Does the patient have difficulty seeing, even when wearing glasses/contacts?: No Does the patient have difficulty concentrating, remembering, or making decisions?: Yes Patient able to express need for assistance with ADLs?: Yes Does the patient have difficulty dressing or bathing?: Yes Independently performs ADLs?: No Communication: Independent Dressing (OT): Needs assistance Is this a change from baseline?: Pre-admission baseline Grooming: Needs assistance Is this a change from baseline?: Pre-admission baseline Feeding: Independent Bathing: Needs assistance Is this a change from baseline?: Pre-admission baseline Toileting: Needs assistance Is this a change from baseline?: Pre-admission baseline In/Out Bed: Needs assistance Is this a change from baseline?: Pre-admission baseline Walks in Home: Needs assistance Is this a change from baseline?: Pre-admission baseline Does the patient have difficulty walking or climbing stairs?: Yes Weakness of Legs: Both Weakness of Arms/Hands: None  Permission Sought/Granted Permission sought to share information with : Case Manager,Family Supports,Other (comment) Permission granted to  share information with : Yes, Verbal Permission Granted  Share Information with NAME: Deloris  Permission granted to share info w AGENCY:  [facility]  Permission granted to share info w Relationship: wife     Emotional Assessment Appearance:: Appears stated age Attitude/Demeanor/Rapport: Engaged Affect (typically observed): Accepting Orientation: : Oriented to Self,Oriented to Place,Oriented to  Time,Oriented to Situation Alcohol / Substance Use: Not Applicable Psych Involvement: No (comment)  Admission diagnosis:  Altered mental status [R41.82] Hypoxia [R09.02] Altered mental status, unspecified altered mental status type [R41.82] Acute congestive heart failure, unspecified heart failure type ([place]) [I50.9] Encephalopathy [G93.40] Patient Active Problem List   Diagnosis Date Noted  . Failure to thrive in adult 02/01/2021  . Altered mental status 01/31/2021  . CHB (complete heart block) ([place]) 01/31/2021  . Encephalopathy 01/31/2021  . Generalized anxiety disorder 05/04/2018  . OSA (obstructive sleep apnea) 05/04/2018  . History of stroke 05/04/2018  . Retinal hemorrhage, left eye 05/04/2018  . Late effects of cerebral ischemic stroke 05/04/2018  . Hypothyroidism 05/04/2018  . Permanent atrial fibrillation ([place]) 05/02/2018  . Tachycardia induced cardiomyopathy ([place]) 05/02/2018  . Hypokalemia 05/02/2018  . Constipation 04/25/2018  . Insomnia due to medical condition 02/22/2018  . Depression, major, recurrent, moderate ([place]) 01/16/2018  . Somatic symptom disorder 01/16/2018  . Vision loss 12/31/2015  . TIA (transient ischemic attack) 12/06/2015  . S/P MVR (mitral valve repair) 11/11/2015  . S/P Minimally invasive maze operation for atrial fibrillation 10/22/2015  . Systolic CHF, chronic ([place])   . Atrial fibrillation with RVR ([place])   . Centrilobular emphysema ([place])   . Hypotension 11/02/2012  . Stress at home 11/02/2012  . Smoking 04/27/2012  . Hyperlipidemia 04/27/2012  . CAD (coronary artery disease) 04/27/2012  . Heart palpitations 09/03/2011  . Dizziness 05/19/2011  . MVP (mitral valve prolapse) 05/05/2011   . Mitral valve regurgitation 05/05/2011  . Pulmonary HTN ([place]) 05/05/2011   PCP:  [name], DO Pharmacy:   [id number] [id number]: [phone]: [phone]    Social Determinants of Health (SDOH) Interventions    Readmission Risk Interventions No flowsheet data found.

## 2021-02-02 NOTE — Discharge Summary (Signed)
Physician Discharge Summary  Patient ID: Michael Delacruz MRN: [id number] DOB/AGE: May 22, 1941 80 y.o.  Admit date: 01/30/2021 Discharge date: 02/02/2021  Admission Diagnoses:  Discharge Diagnoses:  Principal Problem:   Altered mental status Active Problems:   Pulmonary HTN (HCC)   Hyperlipidemia   CAD (coronary artery disease)   Centrilobular emphysema (HCC)   Systolic CHF, chronic (HCC)   S/P Minimally invasive maze operation for atrial fibrillation   S/P MVR (mitral valve repair)   Generalized anxiety disorder   OSA (obstructive sleep apnea)   History of stroke   Hypothyroidism   CHB (complete heart block) ([place])   Encephalopathy   Failure to thrive in adult   Discharged Condition: good  Hospital Course:  Michael Delacruz a 80 y.o.malewith medical history significant ofCHF, A. fib status post Maze procedure, CAD, mitral valve prolapse status post repair, COPD, pulmonary hypertension, OSA, depression, anxiety, hyperlipidemia, CVA, hypothyroidism, insomnia who presents after having a fall last week and some increasing altered mentation since that time. Patient has back pain and leg pain with difficulty walking for about a year. Since had a Covid, patient had increased weakness. About 10 days ago, he had a fall. Since that time, patient has been having difficulty with sleeping, more confused. He also has very poor appetite, he drinks water, but does not eat much. CT scan of the head did not show any acute changes. Patient has pacemaker, not able to perform MRI. It is determined that the patient condition was multifactorial with recent Covid infection, obstruct sleep apnea and sleep deprivation.  He is placed on Seroquel 2/12, he slept better, condition improving.  #1.  Encephalopathy. Insomnia Obstructive sleep apnea. It appears that patient condition is mainly from sleep deprivation.  He slept better after giving Seroquel.  Condition had improved at this point.  2.   Chronic systolic congestive heart failure. Continue oral torsemide.  3.  COPD. Pulmonary hypertension. Stable.  4.  Persistent atrial fibrillation status post maze procedure. No recurrence of atrial fibrillation.  5.  Failure to thrive. Patient has been evaluated by occupational therapy, pending physical therapy, recommend SNF placement.  Discussed with patient wife, family does not want patient to go to nursing home.  We will set up home care and home physical therapy after discharge.  6.  Hypokalemia. Improved.  Consults: None  Significant Diagnostic Studies:   Treatments: seroquel  Discharge Exam: Blood pressure 125/72, pulse 78, temperature 97.8 F (36.6 C), temperature source Oral, resp. rate 20, height 5\' 11"  (1.803 m), weight 98.4 kg, SpO2 96 %. General appearance: alert, cooperative and Oriented x3 Resp: clear to auscultation bilaterally Cardio: regular rate and rhythm, S1, S2 normal, no murmur, click, rub or gallop GI: soft, non-tender; bowel sounds normal; no masses,  no organomegaly Extremities: extremities normal, atraumatic, no cyanosis or edema  Disposition: Discharge disposition: 01-Home or Self Care       Discharge Instructions    Diet - low sodium heart healthy   Complete by: As directed    Increase activity slowly   Complete by: As directed      Allergies as of 02/02/2021      Reactions   Codeine Nausea Only   Digoxin And Related    SOB, bad dreams   Macrodantin [nitrofurantoin Macrocrystal] Rash   Morphine And Related Rash      Medication List    STOP taking these medications   ALPRAZolam 0.25 MG tablet Commonly known as: XANAX   predniSONE 10 MG tablet  Commonly known as: DELTASONE     TAKE these medications   furosemide 40 MG tablet Commonly known as: LASIX TAKE 1 TABLET BY MOUTH TWICE A DAY AS NEEDED   HOMEOPATHIC CALM SL Place under the tongue. Is using Doterra essential oil of Onguard ingestion daily   hydrOXYzine 25 MG  tablet Commonly known as: ATARAX/VISTARIL Take 1 tablet (25 mg total) by mouth at bedtime as needed for anxiety (sleep).   ipratropium 0.06 % nasal spray Commonly known as: ATROVENT Place 2 sprays into both nostrils 4 (four) times daily. For up to 5-7 days then stop.   QUEtiapine 25 MG tablet Commonly known as: SEROQUEL Take 1 tablet (25 mg total) by mouth at bedtime for 14 days.       Follow-up Information    [name], DO Follow up in 1 week(s).   Specialty: Family Medicine Contact information: [place] [address] [phone]        [name], MD .   Specialty: Cardiology Contact information: [place] [address] [phone]              32 minutes Signed: Sharen Hones 02/02/2021, 10:25 AM

## 2021-02-02 NOTE — Telephone Encounter (Signed)
Attempted call to patient for ICM intro and left message for return call.  Patient hospitalized 2/11-2/14 after fall at home and increasing altered mental state following fall.

## 2021-02-04 ENCOUNTER — Telehealth: Payer: Self-pay

## 2021-02-04 NOTE — Telephone Encounter (Signed)
Per spouse  Transition Care Management Follow-up Telephone Call  Date of discharge and from where: 02/02/2021  How have you been since you were released from the hospital? Still feeling weak  Any questions or concerns? No  Items Reviewed:  Did the pt receive and understand the discharge instructions provided? Yes   Medications obtained and verified? No   Other? No   Any new allergies since your discharge? No   Dietary orders reviewed? Yes  Do you have support at home? Yes   Home Care and Equipment/Supplies: Were home health services ordered? yes If so, what is the name of the agency? [facility]  Has the agency set up a time to come to the patient's home? yes Were any new equipment or medical supplies ordered?  No What is the name of the medical supply agency? n/a Were you able to get the supplies/equipment? not applicable Do you have any questions related to the use of the equipment or supplies? No  Functional Questionnaire: (I = Independent and D = Dependent) ADLs: I  Bathing/Dressing- I  Meal Prep- D  Eating- I, not eating much  Maintaining continence- I some supervision  Transferring/Ambulation- I  Managing Meds- D  Follow up appointments reviewed:   PCP Hospital f/u appt confirmed? Yes  Scheduled to see Dr. Parks Ranger on 02/06/2021 @ 3:40.  Are transportation arrangements needed? No   If their condition worsens, is the pt aware to call PCP or go to the Emergency Dept.? Yes  Was the patient provided with contact information for the PCP's office or ED? Yes  Was to pt encouraged to call back with questions or concerns? Yes

## 2021-02-06 ENCOUNTER — Other Ambulatory Visit: Payer: Self-pay

## 2021-02-06 ENCOUNTER — Encounter: Payer: Self-pay | Admitting: Family Medicine

## 2021-02-06 ENCOUNTER — Ambulatory Visit (INDEPENDENT_AMBULATORY_CARE_PROVIDER_SITE_OTHER): Payer: Medicare HMO | Admitting: Family Medicine

## 2021-02-06 VITALS — BP 109/74 | HR 80 | Ht 71.0 in | Wt 181.2 lb

## 2021-02-06 DIAGNOSIS — M6250 Muscle wasting and atrophy, not elsewhere classified, unspecified site: Secondary | ICD-10-CM | POA: Diagnosis not present

## 2021-02-06 DIAGNOSIS — G933 Postviral fatigue syndrome: Secondary | ICD-10-CM | POA: Diagnosis not present

## 2021-02-06 DIAGNOSIS — F411 Generalized anxiety disorder: Secondary | ICD-10-CM | POA: Diagnosis not present

## 2021-02-06 DIAGNOSIS — F32A Depression, unspecified: Secondary | ICD-10-CM | POA: Diagnosis not present

## 2021-02-06 DIAGNOSIS — U071 COVID-19: Secondary | ICD-10-CM | POA: Diagnosis not present

## 2021-02-06 DIAGNOSIS — U099 Post covid-19 condition, unspecified: Secondary | ICD-10-CM | POA: Diagnosis not present

## 2021-02-06 DIAGNOSIS — F331 Major depressive disorder, recurrent, moderate: Secondary | ICD-10-CM | POA: Diagnosis not present

## 2021-02-06 DIAGNOSIS — G4733 Obstructive sleep apnea (adult) (pediatric): Secondary | ICD-10-CM | POA: Diagnosis not present

## 2021-02-06 DIAGNOSIS — G4701 Insomnia due to medical condition: Secondary | ICD-10-CM

## 2021-02-06 DIAGNOSIS — I110 Hypertensive heart disease with heart failure: Secondary | ICD-10-CM | POA: Diagnosis not present

## 2021-02-06 DIAGNOSIS — J432 Centrilobular emphysema: Secondary | ICD-10-CM | POA: Diagnosis not present

## 2021-02-06 DIAGNOSIS — G4700 Insomnia, unspecified: Secondary | ICD-10-CM | POA: Diagnosis not present

## 2021-02-06 MED ORDER — QUETIAPINE FUMARATE 25 MG PO TABS: 25.0000 mg | ORAL_TABLET | Freq: Every day | ORAL | 2 refills | Status: DC

## 2021-02-06 MED ORDER — IPRATROPIUM BROMIDE 0.06 % NA SOLN: 2.0000 | Freq: Four times a day (QID) | NASAL | 3 refills | Status: DC

## 2021-02-06 NOTE — Patient Instructions (Addendum)
Thank you for coming to the office today.  Refilled Quetiapine 25mg  nightly.  Refilled Nose spray atrovent.  Upcoming ENT for the sinus and nose. The CT scan did not show any sinusitis  X-ray of chest and shoulder looks fine. The cause is most likely musculoskeletal for the pain related to movement and coughing w the ribs. Keep on Tylenol.  Ensure or Boost are fine.  Keep on Furosemide 40mg  ONCE daily for now, skip the 2nd dose if weight is stable and breathing is good  Update on Monday with Cardiology - maybe can reduce furosemide to HOLD it until needed - to be determined  Recommend [facility] next may need [facility]   Please schedule a Follow-up Appointment to: Return in about 3 months (around 05/06/2021), or if symptoms worsen or fail to improve.  If you have any other questions or concerns, please feel free to call the office or send a message through [place]. You may also schedule an earlier appointment if necessary.  Additionally, you may be receiving a survey about your experience at our office within a few days to 1 week by e-mail or mail. We value your feedback.  Nobie Putnam, DO [place]

## 2021-02-06 NOTE — Progress Notes (Signed)
Subjective:    Patient ID: Michael Delacruz, male    DOB: 09/15/1941, 80 y.o.   MRN: [id number]  Michael Delacruz is a 80 y.o. male presenting on 02/06/2021 for Hospitalization Follow-up (Post covid) and Weakness   HPI  HOSPITAL FOLLOW-UP VISIT  Hospital/Location: [facility] [place] Date of Admission: 01/30/21 Date of Discharge: 02/02/21 Transitions of care telephone call: Completed 02/04/21 Kellie Simmering LPN  Reason for Admission: Altered Mental Status Post COVID Dehydration  - Hospital H&P and Discharge Summary have been reviewed - Patient presents today 4 days after recent hospitalization. Brief summary of recent course, patient had symptoms of altered mental status, hospitalized, treated with IV rehydration, stabilization and imaging and diagnostic work up.   Recent fall on pacemaker side When coughs has worsening on left pacemaker side Still using nasal spray needs re order  - Today reports overall has done well after discharge. Symptoms of confusion have resolved. He still has weakness  - New medications on discharge: Quetiapine 25mg  nightly for 14 days - sleeping much better, off Xanax now >1 month or more  175 lbs dry weight baseline He is on Furosemide 40mg  BID taking it PRN based on weight  I have reviewed the discharge medication list, and have reconciled the current and discharge medications today.  [place] RN today, then PT tomorrow  Taking Ensure to improve nutrition has poor PO intake  Upcoming heart care Monday [facility]  Left eye blurry and not as clear/ previously with Dr Ellin Mayhew and [facility]   Current Outpatient Medications:  .  furosemide (LASIX) 40 MG tablet, TAKE 1 TABLET BY MOUTH TWICE A DAY AS NEEDED, Disp: 180 tablet, Rfl: 0 .  Homeopathic Products (HOMEOPATHIC CALM SL), Place under the tongue. Is using Doterra essential oil of Onguard ingestion daily, Disp: , Rfl:  .  hydrOXYzine (ATARAX/VISTARIL) 25 MG tablet, Take 1 tablet (25 mg total) by mouth at  bedtime as needed for anxiety (sleep)., Disp: 30 tablet, Rfl: 0 .  ipratropium (ATROVENT) 0.06 % nasal spray, Place 2 sprays into both nostrils 4 (four) times daily. As needed, Disp: 15 mL, Rfl: 3 .  QUEtiapine (SEROQUEL) 25 MG tablet, Take 1 tablet (25 mg total) by mouth at bedtime., Disp: 30 tablet, Rfl: 2  ------------------------------------------------------------------------- Social History   Tobacco Use  . Smoking status: Current Every Day Smoker    Packs/day: 0.50    Years: 52.00    Pack years: 26.00    Types: Cigarettes    Last attempt to quit: 02/13/2018    Years since quitting: 2.9  . Smokeless tobacco: Current User  . Tobacco comment: Resumed smoking, after quit 01/2018  Vaping Use  . Vaping Use: Never used  Substance Use Topics  . Alcohol use: Not Currently    Alcohol/week: 1.0 standard drink    Types: 1 Standard drinks or equivalent per week    Comment: occasionally drinks a margarita  . Drug use: No    Review of Systems Per HPI unless specifically indicated above     Objective:    BP 109/74   Pulse 80   Ht 5\' 11"  (1.803 m)   Wt 181 lb 3.2 oz (82.2 kg)   SpO2 95%   BMI 25.27 kg/m   Wt Readings from Last 3 Encounters:  02/06/21 181 lb 3.2 oz (82.2 kg)  01/31/21 217 lb (98.4 kg)  01/29/21 195 lb (88.5 kg)    Physical Exam Vitals and nursing note reviewed.  Constitutional:  General: He is not in acute distress.    Appearance: He is well-developed and well-nourished. He is not diaphoretic.     Comments: Well-appearing, comfortable, cooperative  HENT:     Head: Normocephalic and atraumatic.     Mouth/Throat:     Mouth: Oropharynx is clear and moist.  Eyes:     General:        Right eye: No discharge.        Left eye: No discharge.     Conjunctiva/sclera: Conjunctivae normal.  Neck:     Thyroid: No thyromegaly.  Cardiovascular:     Rate and Rhythm: Normal rate and regular rhythm.     Pulses: Intact distal pulses.     Heart sounds: Normal  heart sounds. No murmur heard.   Pulmonary:     Effort: Pulmonary effort is normal. No respiratory distress.     Breath sounds: Normal breath sounds. No wheezing or rales.  Musculoskeletal:        General: No edema. Normal range of motion.     Cervical back: Normal range of motion and neck supple.  Lymphadenopathy:     Cervical: No cervical adenopathy.  Skin:    General: Skin is warm and dry.     Findings: No erythema or rash.  Neurological:     Mental Status: He is alert and oriented to person, place, and time.  Psychiatric:        Mood and Affect: Mood and affect normal.        Behavior: Behavior normal.     Comments: Well groomed, good eye contact, normal speech and thoughts      CLINICAL DATA:  Delirium.  Fell.  EXAM: CT HEAD WITHOUT CONTRAST  TECHNIQUE: Contiguous axial images were obtained from the base of the skull through the vertex without intravenous contrast.  COMPARISON:  02/19/2019  FINDINGS: Brain: Age related volume loss. Mild chronic small-vessel ischemic change of the hemispheric white matter. No sign of acute infarction, mass lesion, hemorrhage, hydrocephalus or extra-axial collection.  Vascular: There is atherosclerotic calcification of the major vessels at the base of the brain.  Skull: Normal  Sinuses/Orbits: Clear/normal  Other: None  IMPRESSION: No acute or traumatic finding. Age related volume loss and mild chronic small-vessel ischemic changes of the white matter.   Electronically Signed   By: Nelson Chimes M.D.   On: 01/30/2021 21:27  -----------------  I have personally reviewed the radiology report from 01/30/21 CXR.  CLINICAL DATA:  Patient fell going to the bathroom.  Weakness.  EXAM: PORTABLE CHEST 1 VIEW  COMPARISON:  05/04/2018  FINDINGS: Stable changes from cardiac surgery and valve replacement. Heart is normal size. No mediastinal or hilar masses. Left anterior chest wall biventricular pacemaker is  stable.  Prominent bronchovascular and interstitial markings. No convincing lung consolidation or pulmonary edema. No pleural effusion or pneumothorax. Findings are similar to the prior chest radiograph.  Skeletal structures are grossly intact.  IMPRESSION: 1. No acute cardiopulmonary disease. 2. Chronic lung findings and chronic changes from prior cardiac surgery and valve replacement.   Electronically Signed   By: Lajean Manes M.D.   On: 01/30/2021 20:52   Results for orders placed or performed during the hospital encounter of 01/30/21  Resp Panel by RT-PCR (Flu A&B, Covid) Nasopharyngeal Swab   Specimen: Nasopharyngeal Swab; Nasopharyngeal(NP) swabs in vial transport medium  Result Value Ref Range   SARS Coronavirus 2 by RT PCR NEGATIVE NEGATIVE   Influenza A by PCR NEGATIVE NEGATIVE  Influenza B by PCR NEGATIVE NEGATIVE  Comprehensive metabolic panel  Result Value Ref Range   Sodium 135 135 - 145 mmol/L   Potassium 3.9 3.5 - 5.1 mmol/L   Chloride 97 (L) 98 - 111 mmol/L   CO2 27 22 - 32 mmol/L   Glucose, Bld 148 (H) 70 - 99 mg/dL   BUN 14 8 - 23 mg/dL   Creatinine, Ser 0.98 0.61 - 1.24 mg/dL   Calcium 9.4 8.9 - 10.3 mg/dL   Total Protein 7.1 6.5 - 8.1 g/dL   Albumin 3.3 (L) 3.5 - 5.0 g/dL   AST 99 (H) 15 - 41 U/L   ALT 73 (H) 0 - 44 U/L   Alkaline Phosphatase 84 38 - 126 U/L   Total Bilirubin 1.1 0.3 - 1.2 mg/dL   GFR, Estimated >60 >60 mL/min   Anion gap 11 5 - 15  CBC  Result Value Ref Range   WBC 5.6 4.0 - 10.5 K/uL   RBC 4.61 4.22 - 5.81 MIL/uL   Hemoglobin 14.1 13.0 - 17.0 g/dL   HCT 40.7 39.0 - 52.0 %   MCV 88.3 80.0 - 100.0 fL   MCH 30.6 26.0 - 34.0 pg   MCHC 34.6 30.0 - 36.0 g/dL   RDW 12.8 11.5 - 15.5 %   Platelets 320 150 - 400 K/uL   nRBC 0.0 0.0 - 0.2 %  Urinalysis, Complete w Microscopic Urine, Clean Catch  Result Value Ref Range   Color, Urine YELLOW (A) YELLOW   APPearance CLEAR (A) CLEAR   Specific Gravity, Urine 1.006 1.005 -  1.030   pH 6.0 5.0 - 8.0   Glucose, UA NEGATIVE NEGATIVE mg/dL   Hgb urine dipstick NEGATIVE NEGATIVE   Bilirubin Urine NEGATIVE NEGATIVE   Ketones, ur NEGATIVE NEGATIVE mg/dL   Protein, ur NEGATIVE NEGATIVE mg/dL   Nitrite NEGATIVE NEGATIVE   Leukocytes,Ua NEGATIVE NEGATIVE   RBC / HPF 0-5 0 - 5 RBC/hpf   WBC, UA 0-5 0 - 5 WBC/hpf   Bacteria, UA NONE SEEN NONE SEEN   Squamous Epithelial / LPF NONE SEEN 0 - 5  Brain natriuretic peptide  Result Value Ref Range   B Natriuretic Peptide 242.4 (H) 0.0 - 100.0 pg/mL  TSH  Result Value Ref Range   TSH 1.029 0.350 - 4.500 uIU/mL  Comprehensive metabolic panel  Result Value Ref Range   Sodium 138 135 - 145 mmol/L   Potassium 3.1 (L) 3.5 - 5.1 mmol/L   Chloride 99 98 - 111 mmol/L   CO2 24 22 - 32 mmol/L   Glucose, Bld 130 (H) 70 - 99 mg/dL   BUN 14 8 - 23 mg/dL   Creatinine, Ser 0.91 0.61 - 1.24 mg/dL   Calcium 9.2 8.9 - 10.3 mg/dL   Total Protein 6.7 6.5 - 8.1 g/dL   Albumin 3.2 (L) 3.5 - 5.0 g/dL   AST 100 (H) 15 - 41 U/L   ALT 77 (H) 0 - 44 U/L   Alkaline Phosphatase 81 38 - 126 U/L   Total Bilirubin 1.1 0.3 - 1.2 mg/dL   GFR, Estimated >60 >60 mL/min   Anion gap 15 5 - 15  CBC  Result Value Ref Range   WBC 7.0 4.0 - 10.5 K/uL   RBC 4.56 4.22 - 5.81 MIL/uL   Hemoglobin 14.1 13.0 - 17.0 g/dL   HCT 40.3 39.0 - 52.0 %   MCV 88.4 80.0 - 100.0 fL   MCH 30.9 26.0 - 34.0 pg  MCHC 35.0 30.0 - 36.0 g/dL   RDW 12.8 11.5 - 15.5 %   Platelets 310 150 - 400 K/uL   nRBC 0.0 0.0 - 0.2 %  Blood gas, venous  Result Value Ref Range   pH, Ven 7.51 (H) 7.250 - 7.430   pCO2, Ven 33 (L) 44.0 - 60.0 mmHg   pO2, Ven 67.0 (H) 32.0 - 45.0 mmHg   Bicarbonate 26.3 20.0 - 28.0 mmol/L   Acid-Base Excess 3.7 (H) 0.0 - 2.0 mmol/L   O2 Saturation 94.8 %   Patient temperature 37.0    Collection site VENOUS    Sample type VENOUS   Magnesium  Result Value Ref Range   Magnesium 2.6 (H) 1.7 - 2.4 mg/dL  CBC with Differential/Platelet  Result  Value Ref Range   WBC 7.3 4.0 - 10.5 K/uL   RBC 4.43 4.22 - 5.81 MIL/uL   Hemoglobin 14.1 13.0 - 17.0 g/dL   HCT 39.6 39.0 - 52.0 %   MCV 89.4 80.0 - 100.0 fL   MCH 31.8 26.0 - 34.0 pg   MCHC 35.6 30.0 - 36.0 g/dL   RDW 13.0 11.5 - 15.5 %   Platelets 302 150 - 400 K/uL   nRBC 0.0 0.0 - 0.2 %   Neutrophils Relative % 72 %   Neutro Abs 5.2 1.7 - 7.7 K/uL   Lymphocytes Relative 13 %   Lymphs Abs 0.9 0.7 - 4.0 K/uL   Monocytes Relative 13 %   Monocytes Absolute 0.9 0.1 - 1.0 K/uL   Eosinophils Relative 1 %   Eosinophils Absolute 0.1 0.0 - 0.5 K/uL   Basophils Relative 0 %   Basophils Absolute 0.0 0.0 - 0.1 K/uL   Immature Granulocytes 1 %   Abs Immature Granulocytes 0.05 0.00 - 0.07 K/uL  Basic metabolic panel  Result Value Ref Range   Sodium 137 135 - 145 mmol/L   Potassium 3.5 3.5 - 5.1 mmol/L   Chloride 100 98 - 111 mmol/L   CO2 26 22 - 32 mmol/L   Glucose, Bld 142 (H) 70 - 99 mg/dL   BUN 15 8 - 23 mg/dL   Creatinine, Ser 1.09 0.61 - 1.24 mg/dL   Calcium 9.1 8.9 - 10.3 mg/dL   GFR, Estimated >60 >60 mL/min   Anion gap 11 5 - 15  Magnesium  Result Value Ref Range   Magnesium 2.2 1.7 - 2.4 mg/dL  Basic metabolic panel  Result Value Ref Range   Sodium 139 135 - 145 mmol/L   Potassium 3.6 3.5 - 5.1 mmol/L   Chloride 100 98 - 111 mmol/L   CO2 26 22 - 32 mmol/L   Glucose, Bld 125 (H) 70 - 99 mg/dL   BUN 18 8 - 23 mg/dL   Creatinine, Ser 1.22 0.61 - 1.24 mg/dL   Calcium 9.6 8.9 - 10.3 mg/dL   GFR, Estimated >60 >60 mL/min   Anion gap 13 5 - 15  Magnesium  Result Value Ref Range   Magnesium 2.6 (H) 1.7 - 2.4 mg/dL  Troponin I (High Sensitivity)  Result Value Ref Range   Troponin I (High Sensitivity) 17 <18 ng/L  Troponin I (High Sensitivity)  Result Value Ref Range   Troponin I (High Sensitivity) 18 (H) <18 ng/L      Assessment & Plan:   Problem List Items Addressed This Visit    Insomnia due to medical condition   Relevant Medications   QUEtiapine (SEROQUEL)  25 MG tablet   Generalized anxiety  disorder   Relevant Medications   QUEtiapine (SEROQUEL) 25 MG tablet   Depression, major, recurrent, moderate (HCC) - Primary   Relevant Medications   QUEtiapine (SEROQUEL) 25 MG tablet    Other Visit Diagnoses    COVID-19 virus infection       Relevant Medications   ipratropium (ATROVENT) 0.06 % nasal spray     #Post COVID SYndrome Still congestion, weakness, fatigue Re order Atrovent Nasal  #Depression / Insomnia Controlled, now on Seroquel - will re order low dose 25mg  nightly OFF Xanax BDZ  #Poor nutrition PO Keep on Ensure  #CHF No edema, euvolemic Near dry wt Advised Furosemide 40mg  daily for now then PRN, can go up to BID based on wt   Meds ordered this encounter  Medications  . QUEtiapine (SEROQUEL) 25 MG tablet    Sig: Take 1 tablet (25 mg total) by mouth at bedtime.    Dispense:  30 tablet    Refill:  2  . ipratropium (ATROVENT) 0.06 % nasal spray    Sig: Place 2 sprays into both nostrils 4 (four) times daily. As needed    Dispense:  15 mL    Refill:  3    Follow up plan: Return in about 3 months (around 05/06/2021), or if symptoms worsen or fail to improve.   Nobie Putnam, [facility] 02/06/2021, 3:57 PM

## 2021-02-07 DIAGNOSIS — G4700 Insomnia, unspecified: Secondary | ICD-10-CM | POA: Diagnosis not present

## 2021-02-07 DIAGNOSIS — U099 Post covid-19 condition, unspecified: Secondary | ICD-10-CM | POA: Diagnosis not present

## 2021-02-07 DIAGNOSIS — F32A Depression, unspecified: Secondary | ICD-10-CM | POA: Diagnosis not present

## 2021-02-07 DIAGNOSIS — J432 Centrilobular emphysema: Secondary | ICD-10-CM | POA: Diagnosis not present

## 2021-02-07 DIAGNOSIS — G4733 Obstructive sleep apnea (adult) (pediatric): Secondary | ICD-10-CM | POA: Diagnosis not present

## 2021-02-07 DIAGNOSIS — F411 Generalized anxiety disorder: Secondary | ICD-10-CM | POA: Diagnosis not present

## 2021-02-07 DIAGNOSIS — G933 Postviral fatigue syndrome: Secondary | ICD-10-CM | POA: Diagnosis not present

## 2021-02-07 DIAGNOSIS — M6250 Muscle wasting and atrophy, not elsewhere classified, unspecified site: Secondary | ICD-10-CM | POA: Diagnosis not present

## 2021-02-07 DIAGNOSIS — I110 Hypertensive heart disease with heart failure: Secondary | ICD-10-CM | POA: Diagnosis not present

## 2021-02-09 ENCOUNTER — Ambulatory Visit (INDEPENDENT_AMBULATORY_CARE_PROVIDER_SITE_OTHER): Payer: Medicare HMO | Admitting: Nurse Practitioner

## 2021-02-09 ENCOUNTER — Other Ambulatory Visit: Payer: Self-pay

## 2021-02-09 ENCOUNTER — Encounter: Payer: Self-pay | Admitting: Nurse Practitioner

## 2021-02-09 VITALS — BP 100/56 | HR 83 | Ht 68.0 in | Wt 178.0 lb

## 2021-02-09 DIAGNOSIS — J432 Centrilobular emphysema: Secondary | ICD-10-CM | POA: Diagnosis not present

## 2021-02-09 DIAGNOSIS — G4733 Obstructive sleep apnea (adult) (pediatric): Secondary | ICD-10-CM | POA: Diagnosis not present

## 2021-02-09 DIAGNOSIS — I4821 Permanent atrial fibrillation: Secondary | ICD-10-CM

## 2021-02-09 DIAGNOSIS — Z6825 Body mass index (BMI) 25.0-25.9, adult: Secondary | ICD-10-CM | POA: Diagnosis not present

## 2021-02-09 DIAGNOSIS — I428 Other cardiomyopathies: Secondary | ICD-10-CM

## 2021-02-09 DIAGNOSIS — I509 Heart failure, unspecified: Secondary | ICD-10-CM | POA: Diagnosis not present

## 2021-02-09 DIAGNOSIS — I77819 Aortic ectasia, unspecified site: Secondary | ICD-10-CM

## 2021-02-09 DIAGNOSIS — I25118 Atherosclerotic heart disease of native coronary artery with other forms of angina pectoris: Secondary | ICD-10-CM | POA: Diagnosis not present

## 2021-02-09 DIAGNOSIS — J449 Chronic obstructive pulmonary disease, unspecified: Secondary | ICD-10-CM | POA: Diagnosis not present

## 2021-02-09 DIAGNOSIS — Z9889 Other specified postprocedural states: Secondary | ICD-10-CM | POA: Diagnosis not present

## 2021-02-09 DIAGNOSIS — I739 Peripheral vascular disease, unspecified: Secondary | ICD-10-CM | POA: Diagnosis not present

## 2021-02-09 DIAGNOSIS — F411 Generalized anxiety disorder: Secondary | ICD-10-CM | POA: Diagnosis not present

## 2021-02-09 DIAGNOSIS — M6250 Muscle wasting and atrophy, not elsewhere classified, unspecified site: Secondary | ICD-10-CM | POA: Diagnosis not present

## 2021-02-09 DIAGNOSIS — E46 Unspecified protein-calorie malnutrition: Secondary | ICD-10-CM | POA: Diagnosis not present

## 2021-02-09 DIAGNOSIS — I480 Paroxysmal atrial fibrillation: Secondary | ICD-10-CM | POA: Diagnosis not present

## 2021-02-09 DIAGNOSIS — U099 Post covid-19 condition, unspecified: Secondary | ICD-10-CM | POA: Diagnosis not present

## 2021-02-09 DIAGNOSIS — I110 Hypertensive heart disease with heart failure: Secondary | ICD-10-CM | POA: Diagnosis not present

## 2021-02-09 DIAGNOSIS — G4700 Insomnia, unspecified: Secondary | ICD-10-CM | POA: Diagnosis not present

## 2021-02-09 DIAGNOSIS — Z8616 Personal history of COVID-19: Secondary | ICD-10-CM | POA: Diagnosis not present

## 2021-02-09 DIAGNOSIS — E261 Secondary hyperaldosteronism: Secondary | ICD-10-CM | POA: Diagnosis not present

## 2021-02-09 DIAGNOSIS — F32A Depression, unspecified: Secondary | ICD-10-CM | POA: Diagnosis not present

## 2021-02-09 DIAGNOSIS — G933 Postviral fatigue syndrome: Secondary | ICD-10-CM | POA: Diagnosis not present

## 2021-02-09 MED ORDER — FUROSEMIDE 40 MG PO TABS: 40.0000 mg | ORAL_TABLET | Freq: Every day | ORAL | 3 refills | Status: DC | PRN

## 2021-02-09 NOTE — Progress Notes (Signed)
Office Visit    Patient Name: Michael Delacruz Date of Encounter: 02/09/2021  Primary Care Provider:  [name], DO Primary Cardiologist:  Ida Rogue, MD  Chief Complaint    80 year old male with a history of tobacco abuse, COPD, severe mitral regurgitation status post mitral valve repair (November 2016), moderate pulmonary hypertension, atrial fibrillation/flutter status post permanent pacemaker and AV nodal ablation, nonischemic cardiomyopathy and HFrEF with an EF of 30 to 35% (December 2019), retinal bleeding exacerbated by oral anticoagulation requiring surgery x2 with residual right eye vision deficits, nonobstructive CAD, hypertension, who presents for follow-up of heart failure.  Past Medical History    Past Medical History:  Diagnosis Date  . Aortic dilatation ([place])    a. 11/2018 Echo: Ao root 68mm, Asc Ao 49mm.  . Arthritis   . Asthma   . Bradycardia   . CAD in native artery    a. LHC 09/2015: 40% pCx, 35% mRCA.  [name] COPD (chronic obstructive pulmonary disease) ([place])   . Dilation of intestine 01/2015  . Fibromyalgia   . Frequent headaches   . GERD (gastroesophageal reflux disease)   . History of blood clots    eye   . History of hiatal hernia   . History of rheumatic fever   . Hypertension   . Kidney stone   . NICM (nonischemic cardiomyopathy) ([place])    a. 07/2017 Echo: EF reduced to 40-45%; b. 07/2018 Echo: EF 25-30%; c. 11/2018 Echo: EF 30-35%, diff HK w/ sev inf HK, Ao root 34mm, Asc Ao 30mm, Mild MS (grad 84mmHg), mildly dil LA.  [name] PAF (paroxysmal atrial fibrillation) ([place])    a. s/p TEE/DCCV 07/2015. b. H/o bleeding on Coumadin when INR >5, changed to Eliquis-subsequently discontinued; c. 04/2018 s/p SJM 3562 Quadra Allure MP DC PPM & AVN ablation.  . S/P Minimally invasive maze operation for atrial fibrillation 10/22/2015   Complete bilateral atrial lesion set using cryothermy and bipolar radiofrequency ablation with clipping of LA appendage via  right mini thoracotomy approach  . S/P minimally invasive mitral valve repair 10/22/2015   Complex valvuloplasty including triangular resection of posterior leaflet, artificial Gore-tex neochord placement x6 and 38 mm Sorin Memo 3D Rechord ring annuloplasty via right minithoracotomy approach  . Severe mitral regurgitation s/p MVR    a. s/p MV repair 10/2015; b. 11/2018 Echo: Mild MS (mean grad 12mmHg).  . Sleep apnea   . Stroke ([place])   . Thyroid disorder   . TIA (transient ischemic attack)   . Tobacco abuse    Past Surgical History:  Procedure Laterality Date  . ANKLE SURGERY    . AV NODE ABLATION N/A 05/04/2018   Procedure: AV NODE ABLATION;  Surgeon: Thompson Grayer, MD;  Location: [facility];  Service: Cardiovascular;  Laterality: N/A;  . BIV PACEMAKER INSERTION CRT-P N/A 05/03/2018   Procedure: BIV PACEMAKER INSERTION CRT-P;  Surgeon: Deboraha Sprang, MD;  Location: [facility];  Service: Cardiovascular;  Laterality: N/A;  . CARDIAC CATHETERIZATION N/A 10/03/2015   Procedure: Right and Left Heart Cath and Coronary Angiography;  Surgeon: [name], MD;  Location: [facility];  Service: Cardiovascular;  Laterality: N/A;  . COLONOSCOPY    . ELECTROPHYSIOLOGIC STUDY N/A 08/18/2015   Procedure: CARDIOVERSION;  Surgeon: Wellington Hampshire, MD;  Location: [facility];  Service: Cardiovascular;  Laterality: N/A;  . MINIMALLY INVASIVE MAZE PROCEDURE N/A 10/22/2015   Procedure: MINIMALLY INVASIVE MAZE PROCEDURE;  Surgeon: Rexene Alberts,  MD;  [facility];  Service: Open Heart Surgery;  Laterality: N/A;  . MITRAL VALVE REPAIR Right 10/22/2015   Procedure: MINIMALLY INVASIVE MITRAL VALVE REPAIR (MVR);  Surgeon: Rexene Alberts, MD;  Location: [facility];  Service: Open Heart Surgery;  Laterality: Right;  . SINUS EXPLORATION    . TEE WITHOUT CARDIOVERSION N/A 08/18/2015   Procedure: TRANSESOPHAGEAL ECHOCARDIOGRAM (TEE);  Surgeon: Wellington Hampshire, MD;  Location: [facility];   Service: Cardiovascular;  Laterality: N/A;  . TEE WITHOUT CARDIOVERSION N/A 10/22/2015   Procedure: TRANSESOPHAGEAL ECHOCARDIOGRAM (TEE);  Surgeon: Rexene Alberts, MD;  Location: [facility];  Service: Open Heart Surgery;  Laterality: N/A;    Allergies  Allergies  Allergen Reactions  . Codeine Nausea Only  . Digoxin And Related     SOB, bad dreams  . Macrodantin [Nitrofurantoin Macrocrystal] Rash  . Morphine And Related Rash    History of Present Illness    80 year old male with above complex past medical history including nonobstructive CAD, severe mitral regurgitation status post mitral valve repair, moderate pulmonary hypertension, hypertension, atrial fibrillation/flutter status post permanent pacemaker and AV node ablation, dilated aorta, nonischemic cardiomyopathy and HFrEF, ongoing tobacco abuse, COPD, and retinal bleeding exacerbated by oral anticoagulation with ongoing right visual field deficits. In the setting of progressive mitral valve disease, he underwent diagnostic catheterization in 2016 revealing moderate nonobstructive left circumflex and RCA disease. He subsequently underwent successful mitral valve repair with surgical maze in the setting of a history of paroxysmal atrial fibrillation and flutter. He had previously been treated with amiodarone which was discontinued secondary to nightmares, and oral anticoagulation, which was discontinued secondary to retinal bleeding requiring 2 surgeries (pt has preferred to remain off of [place]). As noted, he has ongoing right eye vision deficits. In 2018, he was found to have LV dysfunction with an EF of 40 to 45% in the setting of atrial flutter. This was difficult to rhythm and rate control and as result, he underwent successful permanent pacemaker placement and AV nodal ablation in May 2019. EF was measured at 25 to 30% in August 2019 but improved to 30-35% by echo in December 2019. That echo also showed stable dilation of the aortic root at 43  mm and ascending aorta at 41 mm. Mild mitral stenosis with a mean gradient of 5 mmHg was noted and was stable from prior echo.  Mr. Leeson was last seen in clinic in December 2021. It was noted that he did not do well with Entresto previously secondary to orthostasis. He was not interested in guideline directed medical therapy. He was offered an echocardiogram to follow-up LV function and also dilated aortic root however, he deferred.  Unfortunately, he developed COVID19 earlier this year and this was associated severe weakness, restlessness, and altered mental status.  In that setting, he fell in the shower at one point and struck the left side of his upper chest associated with severe pain.  His wife noted poor appetite and significant weight loss.  He was taken to the emergency department on February 11, where CT of the head was negative.  Chest x-ray was without acute findings.  He was discharged home on February 14.  Since discharge, he has noted slow but steady improvement in dyspnea and fatigue.  His appetite has been slow to return.  He continues to have some left upper chest wall discomfort and tenderness, though this is improving as well.  He has not been experiencing any angina denies palpitations, PND, orthopnea,  dizziness, syncope, edema, or early satiety.  Home Medications    Prior to Admission medications   Medication Sig Start Date End Date Taking? Authorizing Provider  furosemide (LASIX) 40 MG tablet TAKE 1 TABLET BY MOUTH TWICE A DAY AS NEEDED 11/05/20   Deboraha Sprang, MD  Homeopathic Products (HOMEOPATHIC CALM SL) Place under the tongue. Is using Doterra essential oil of Onguard ingestion daily    [provider]  hydrOXYzine (ATARAX/VISTARIL) 25 MG tablet Take 1 tablet (25 mg total) by mouth at bedtime as needed for anxiety (sleep). 01/29/21   Karamalegos, Devonne Doughty, DO  ipratropium (ATROVENT) 0.06 % nasal spray Place 2 sprays into both nostrils 4 (four) times daily. As  needed 02/06/21   [name], DO  QUEtiapine (SEROQUEL) 25 MG tablet Take 1 tablet (25 mg total) by mouth at bedtime. 02/06/21   [name], DO    Review of Systems    Ongoing fatigue and somewhat poor appetite in the setting of recent Covid infection.  Ongoing left upper chest tenderness following recent fall.  He has dyspnea on exertion which is improving slowly since his Covid infection.  He denies palpitations, chest pain, PND, orthopnea, dizziness, syncope, edema, or early satiety.  All other systems reviewed and are otherwise negative except as noted above.  Physical Exam    VS:  BP (!) 100/56 (BP Location: Left Arm, Patient Position: Sitting, Cuff Size: Normal)   Pulse 83   Ht 5\' 8"  (1.727 m)   Wt 178 lb (80.7 kg)   SpO2 98%   BMI 27.06 kg/m  , BMI Body mass index is 27.06 kg/m. GEN: Well nourished, well developed, in no acute distress. HEENT: normal. Neck: Supple, no JVD, carotid bruits, or masses. Cardiac: RRR, no murmurs, rubs, or gallops. No clubbing, cyanosis, edema.  Radials/PT 2+ and equal bilaterally.  Respiratory:  Respirations regular and unlabored, clear to auscultation bilaterally. GI: Soft, nontender, nondistended, BS + x 4. MS: no deformity or atrophy. Skin: warm and dry, no rash. Neuro:  Strength and sensation are intact. Psych: Normal affect.  Accessory Clinical Findings    ECG personally reviewed by me today -V paced with underlying A. fib- no acute changes.  Lab Results  Component Value Date   WBC 7.3 02/01/2021   HGB 14.1 02/01/2021   HCT 39.6 02/01/2021   MCV 89.4 02/01/2021   PLT 302 02/01/2021   Lab Results  Component Value Date   CREATININE 1.22 02/02/2021   BUN 18 02/02/2021   NA 139 02/02/2021   K 3.6 02/02/2021   CL 100 02/02/2021   CO2 26 02/02/2021   Lab Results  Component Value Date   ALT 77 (H) 01/31/2021   AST 100 (H) 01/31/2021   ALKPHOS 81 01/31/2021   BILITOT 1.1 01/31/2021   Lab Results   Component Value Date   CHOL 215 (H) 05/01/2018   HDL 42 05/01/2018   LDLCALC 157 (H) 05/01/2018   TRIG 79 05/01/2018   CHOLHDL 5.1 05/01/2018    Lab Results  Component Value Date   HGBA1C 5.5 05/01/2018    Assessment & Plan    1.  Nonischemic cardiomyopathy/HFrEF: EF 30 to 35% by echo in December 2019.  Patient recently noted on corvue to have excess volume and he was advised to take Lasix 40 mg twice daily for 3 days.  Patient was subsequently hospitalized in the setting of weakness, altered mental status, and recent COVID-19 infection.  Since mid February, his weight is  down nearly 20 pounds.  He is euvolemic on examination today.  I note that his creatinine was mildly elevated at 1.22 on February 14, when he was in the hospital.  I recommended follow-up labs today but he wished to defer.  Given weight loss, soft blood pressure, and recent illness with ongoing anorexia, I will have him reduce Lasix back to daily as needed for weight gain.  He is otherwise not on any beta-blocker, ACE inhibitor, ARB, ARNI, or MRA in the setting of soft blood pressure and his wish to defer any additional therapy.  He was agreeable to follow-up echocardiogram given known cardiomyopathy, mitral valve disease, and dilated aorta.  2.  Permanent atrial fibrillation: Status post biventricular pacemaker placement and AV nodal ablation in May 2019.  Underlying A. fib today.  CHA2DS2-VASc equals 7.  Patient continues to wish to avoid oral anticoagulation in the setting of prior retinal hemorrhage and right eye vision loss.  3.  Status post mitral valve repair: Mild mitral stenosis on most recent echo.  Patient agreeable to repeat.  4.  Aortic dilatation: 43 mm aortic root with a 41 mm ascending aorta on echo in December 2019.  He is agreeable to repeat echo to evaluate.  5.  Recent COVID-19 infection: Patient had complicated course with severe malaise, fatigue, anorexia, and altered mental status.  His strength is  returning and dyspnea steadily improving.  6.  Disposition: Follow-up in clinic in 3 months or sooner if necessary.   Murray Hodgkins, NP 02/09/2021, 10:35 AM

## 2021-02-09 NOTE — Patient Instructions (Addendum)
Medication Instructions:  Your physician has recommended you make the following change in your medication:   1. DECREASE Furosemide 40 mg once daily as needed for weight gain or shortness of breath  *If you need a refill on your cardiac medications before your next appointment, please call your pharmacy*   Lab Work: None  If you have labs (blood work) drawn today and your tests are completely normal, you will receive your results only by: [name] MyChart Message (if you have MyChart) OR . A paper copy in the mail If you have any lab test that is abnormal or we need to change your treatment, we will call you to review the results.   Testing/Procedures: Your physician has requested that you have an echocardiogram. Echocardiography is a painless test that uses sound waves to create images of your heart. It provides your doctor with information about the size and shape of your heart and how well your heart's chambers and valves are working. This procedure takes approximately one hour. There are no restrictions for this procedure.    Follow-Up: At [facility], you and your health needs are our priority.  As part of our continuing mission to provide you with exceptional heart care, we have created designated Provider Care Teams.  These Care Teams include your primary Cardiologist (physician) and Advanced Practice Providers (APPs -  Physician Assistants and Nurse Practitioners) who all work together to provide you with the care you need, when you need it.    Your next appointment:   3 month(s)  The format for your next appointment:   In Person  Provider:   Ida Rogue, MD or Murray Hodgkins, NP

## 2021-02-10 ENCOUNTER — Ambulatory Visit (INDEPENDENT_AMBULATORY_CARE_PROVIDER_SITE_OTHER): Payer: Medicare HMO | Admitting: Otolaryngology

## 2021-02-10 ENCOUNTER — Ambulatory Visit (INDEPENDENT_AMBULATORY_CARE_PROVIDER_SITE_OTHER): Payer: Medicare HMO | Admitting: Licensed Clinical Social Worker

## 2021-02-10 ENCOUNTER — Other Ambulatory Visit (HOSPITAL_COMMUNITY)
Admission: RE | Admit: 2021-02-10 | Discharge: 2021-02-10 | Disposition: A | Discharge status: Home / Self Care | Payer: Medicare HMO | Source: Ambulatory Visit | Attending: Otolaryngology | Admitting: Otolaryngology

## 2021-02-10 ENCOUNTER — Encounter (INDEPENDENT_AMBULATORY_CARE_PROVIDER_SITE_OTHER): Payer: Self-pay | Admitting: Otolaryngology

## 2021-02-10 VITALS — Temp 97.7°F

## 2021-02-10 DIAGNOSIS — G4701 Insomnia due to medical condition: Secondary | ICD-10-CM

## 2021-02-10 DIAGNOSIS — D487 Neoplasm of uncertain behavior of other specified sites: Secondary | ICD-10-CM

## 2021-02-10 DIAGNOSIS — C44321 Squamous cell carcinoma of skin of nose: Secondary | ICD-10-CM | POA: Diagnosis not present

## 2021-02-10 DIAGNOSIS — I5032 Chronic diastolic (congestive) heart failure: Secondary | ICD-10-CM

## 2021-02-10 DIAGNOSIS — J310 Chronic rhinitis: Secondary | ICD-10-CM

## 2021-02-10 DIAGNOSIS — F331 Major depressive disorder, recurrent, moderate: Secondary | ICD-10-CM

## 2021-02-10 DIAGNOSIS — F411 Generalized anxiety disorder: Secondary | ICD-10-CM

## 2021-02-10 NOTE — Progress Notes (Signed)
Path

## 2021-02-10 NOTE — Progress Notes (Signed)
HPI: Michael Delacruz is a 80 y.o. male who presents is referred by his PCP for evaluation of nodule within the left nasal cavity that is causing pain and intermittent obstruction.  He has had a previous history of a basal cell carcinoma removed from the dorsum of his nose several years ago.  This is been in his nose for couple months now is gradually gotten worse.[name] He also complains of some nasal congestion on both sides.  Apparently he has had previous surgery on his sinuses on the right side several years ago.  Past Medical History:  Diagnosis Date  . Aortic dilatation ([place])    a. 11/2018 Echo: Ao root 2mm, Asc Ao 63mm.  . Arthritis   . Asthma   . Bradycardia   . CAD in native artery    a. LHC 09/2015: 40% pCx, 35% mRCA.  [name] COPD (chronic obstructive pulmonary disease) ([place])   . Dilation of intestine 01/2015  . Fibromyalgia   . Frequent headaches   . GERD (gastroesophageal reflux disease)   . History of blood clots    eye   . History of hiatal hernia   . History of rheumatic fever   . Hypertension   . Kidney stone   . NICM (nonischemic cardiomyopathy) ([place])    a. 07/2017 Echo: EF reduced to 40-45%; b. 07/2018 Echo: EF 25-30%; c. 11/2018 Echo: EF 30-35%, diff HK w/ sev inf HK, Ao root 33mm, Asc Ao 70mm, Mild MS (grad 34mmHg), mildly dil LA.  [name] PAF (paroxysmal atrial fibrillation) ([place])    a. s/p TEE/DCCV 07/2015. b. H/o bleeding on Coumadin when INR >5, changed to Eliquis-subsequently discontinued; c. 04/2018 s/p SJM 3562 Quadra Allure MP DC PPM & AVN ablation.  . S/P Minimally invasive maze operation for atrial fibrillation 10/22/2015   Complete bilateral atrial lesion set using cryothermy and bipolar radiofrequency ablation with clipping of LA appendage via right mini thoracotomy approach  . S/P minimally invasive mitral valve repair 10/22/2015   Complex valvuloplasty including triangular resection of posterior leaflet, artificial Gore-tex neochord placement x6 and 38 mm Sorin Memo 3D  Rechord ring annuloplasty via right minithoracotomy approach  . Severe mitral regurgitation s/p MVR    a. s/p MV repair 10/2015; b. 11/2018 Echo: Mild MS (mean grad 50mmHg).  . Sleep apnea   . Stroke ([place])   . Thyroid disorder   . TIA (transient ischemic attack)   . Tobacco abuse    Past Surgical History:  Procedure Laterality Date  . ANKLE SURGERY    . AV NODE ABLATION N/A 05/04/2018   Procedure: AV NODE ABLATION;  Surgeon: Thompson Grayer, MD;  Location: [facility];  Service: Cardiovascular;  Laterality: N/A;  . BIV PACEMAKER INSERTION CRT-P N/A 05/03/2018   Procedure: BIV PACEMAKER INSERTION CRT-P;  Surgeon: Deboraha Sprang, MD;  Location: [facility];  Service: Cardiovascular;  Laterality: N/A;  . CARDIAC CATHETERIZATION N/A 10/03/2015   Procedure: Right and Left Heart Cath and Coronary Angiography;  Surgeon: [name], MD;  Location: [facility];  Service: Cardiovascular;  Laterality: N/A;  . COLONOSCOPY    . ELECTROPHYSIOLOGIC STUDY N/A 08/18/2015   Procedure: CARDIOVERSION;  Surgeon: Wellington Hampshire, MD;  Location: [facility];  Service: Cardiovascular;  Laterality: N/A;  . MINIMALLY INVASIVE MAZE PROCEDURE N/A 10/22/2015   Procedure: MINIMALLY INVASIVE MAZE PROCEDURE;  Surgeon: Rexene Alberts, MD;  Location: [facility];  Service: Open Heart Surgery;  Laterality: N/A;  . MITRAL VALVE  REPAIR Right 10/22/2015   Procedure: MINIMALLY INVASIVE MITRAL VALVE REPAIR (MVR);  Surgeon: Rexene Alberts, MD;  Location: [facility];  Service: Open Heart Surgery;  Laterality: Right;  . SINUS EXPLORATION    . TEE WITHOUT CARDIOVERSION N/A 08/18/2015   Procedure: TRANSESOPHAGEAL ECHOCARDIOGRAM (TEE);  Surgeon: Wellington Hampshire, MD;  Location: [facility];  Service: Cardiovascular;  Laterality: N/A;  . TEE WITHOUT CARDIOVERSION N/A 10/22/2015   Procedure: TRANSESOPHAGEAL ECHOCARDIOGRAM (TEE);  Surgeon: Rexene Alberts, MD;  Location: [facility];  Service: Open Heart Surgery;  Laterality: N/A;    Social History   Socioeconomic History  . Marital status: Married    Spouse name: [name]  . Number of children: 3  . Years of education: GED  . Highest education level: GED or equivalent  Occupational History  . Not on file  Tobacco Use  . Smoking status: Current Every Day Smoker    Packs/day: 0.50    Years: 52.00    Pack years: 26.00    Types: Cigarettes    Start date: 103  . Smokeless tobacco: Current User  . Tobacco comment: Resumed smoking, after quit 01/2018  Vaping Use  . Vaping Use: Never used  Substance and Sexual Activity  . Alcohol use: Not Currently    Alcohol/week: 1.0 standard drink    Types: 1 Standard drinks or equivalent per week    Comment: occasionally drinks a margarita  . Drug use: No  . Sexual activity: Not on file  Other Topics Concern  . Not on file  Social History Narrative   Right handed    2-3 cups coffee per day         Social Determinants of Health   Financial Resource Strain: High Risk  . Difficulty of Paying Living Expenses: Hard  Food Insecurity: Food Insecurity Present  . Worried About Charity fundraiser in the Last Year: Often true  . Ran Out of Food in the Last Year: Often true  Transportation Needs: No Transportation Needs  . Lack of Transportation (Medical): No  . Lack of Transportation (Non-Medical): No  Physical Activity: Inactive  . Days of Exercise per Week: 0 days  . Minutes of Exercise per Session: 0 min  Stress: No Stress Concern Present  . Feeling of Stress : Not at all  Social Connections: Moderately Isolated  . Frequency of Communication with Friends and Family: More than three times a week  . Frequency of Social Gatherings with Friends and Family: More than three times a week  . Attends Religious Services: Never  . Active Member of Clubs or Organizations: No  . Attends Archivist Meetings: Never  . Marital Status: Married   Family History  Problem Relation Age of Onset  . Stroke Mother   .  Irregular heart beat Mother   . Heart murmur Brother   . Pulmonary embolism Brother   . Hypertension Other   . Pulmonary embolism Maternal Uncle    Allergies  Allergen Reactions  . Codeine Nausea Only  . Digoxin And Related     SOB, bad dreams  . Macrodantin [Nitrofurantoin Macrocrystal] Rash  . Morphine And Related Rash   Prior to Admission medications   Medication Sig Start Date End Date Taking? Authorizing Provider  Acetaminophen (TYLENOL 8 HOUR PO) Take by mouth every 8 (eight) hours. As needed    [provider]  furosemide (LASIX) 40 MG tablet Take 1 tablet (40 mg total) by mouth daily as needed (As needed for  weight gain or shortness of breath). 02/09/21   Theora Gianotti, NP  Homeopathic Products (HOMEOPATHIC CALM SL) Place under the tongue. Is using Doterra essential oil of Onguard ingestion daily    [provider]  ipratropium (ATROVENT) 0.06 % nasal spray Place 2 sprays into both nostrils 4 (four) times daily. As needed 02/06/21   [name], DO  QUEtiapine (SEROQUEL) 25 MG tablet Take 1 tablet (25 mg total) by mouth at bedtime. 02/06/21   Karamalegos, Devonne Doughty, DO     Positive ROS: Otherwise negative  All other systems have been reviewed and were otherwise negative with the exception of those mentioned in the HPI and as above.  Physical Exam: Constitutional: Alert, well-appearing, no acute distress Ears: External ears without lesions or tenderness. Ear canals are clear bilaterally with intact, clear TMs.  Nasal: External nose without lesions. Septum with minimal deformity..  Patient with an ulcerative mass in the lower left nostril and measures approximately 1 to 2 cm in size and is consistent with a probable basal cell carcinoma.  After injecting the nose with 1 cc Xylocaine with epinephrine a biopsy was obtained from the mass.  This extends up into the mucous membranes of the nose and extends to the lower nasal rim of the  nostril.  Edges of the ulcer has rolled edges consistent with basal cell carcinoma.  Nasal endoscopy was performed on both sides.  On nasal endoscopy the right sinus cavities have previously been opened up and are clear.  The left nasal cavity is clear with no polyps and no signs of infection.  The nasopharynx was clear bilaterally. Oral: Lips and gums without lesions. Tongue and palate mucosa without lesions. Posterior oropharynx clear. Neck: No palpable adenopathy or masses Respiratory: Breathing comfortably  Skin: No facial/neck lesions or rash noted.  Procedures  Assessment: Left nasal cavity mass consistent with probable basal cell carcinoma.  Plan: A biopsy was obtained in the office today.  Following results of the biopsy we will plan wide excision and full-thickness skin graft. Patient will call us on Friday concerning results of the biopsy and scheduling surgery.   Radene Journey, MD   CC:

## 2021-02-10 NOTE — Chronic Care Management (AMB) (Signed)
Chronic Care Management    Clinical Social Work Note  02/10/2021 Name: Michael Delacruz MRN: [id number] DOB: September 28, 1941  Michael Delacruz is a 80 y.o. year old male who is a primary care patient of [name], DO. The CCM team was consulted to assist the patient with chronic disease management and/or care coordination needs related to: Level of Care Concerns and Mental Health Counseling and Resources.   Engaged with patient by telephone for follow up visit in response to provider referral for social work chronic care management and care coordination services.   Consent to Services:  The patient was given the following information about Chronic Care Management services today, agreed to services, and gave verbal consent: 1. CCM service includes personalized support from designated clinical staff supervised by the primary care provider, including individualized plan of care and coordination with other care providers 2. 24/7 contact phone numbers for assistance for urgent and routine care needs. 3. Service will only be billed when office clinical staff spend 20 minutes or more in a month to coordinate care. 4. Only one practitioner may furnish and bill the service in a calendar month. 5.The patient may stop CCM services at any time (effective at the end of the month) by phone call to the office staff. 6. The patient will be responsible for cost sharing (co-pay) of up to 20% of the service fee (after annual deductible is met). Patient agreed to services and consent obtained.  Patient agreed to services and consent obtained.   Assessment: Review of patient past medical history, allergies, medications, and health status, including review of relevant consultants reports was performed today as part of a comprehensive evaluation and provision of chronic care management and care coordination services.     SDOH (Social Determinants of Health) assessments and interventions performed:    Advanced  Directives Status: See Care Plan for related entries.  CCM Care Plan  Allergies  Allergen Reactions  . Codeine Nausea Only  . Digoxin And Related     SOB, bad dreams  . Macrodantin [Nitrofurantoin Macrocrystal] Rash  . Morphine And Related Rash    Outpatient Encounter Medications as of 02/10/2021  Medication Sig  . Acetaminophen (TYLENOL 8 HOUR PO) Take by mouth every 8 (eight) hours. As needed  . furosemide (LASIX) 40 MG tablet Take 1 tablet (40 mg total) by mouth daily as needed (As needed for weight gain or shortness of breath).  . Homeopathic Products (HOMEOPATHIC CALM SL) Place under the tongue. Is using Doterra essential oil of Onguard ingestion daily  . ipratropium (ATROVENT) 0.06 % nasal spray Place 2 sprays into both nostrils 4 (four) times daily. As needed  . QUEtiapine (SEROQUEL) 25 MG tablet Take 1 tablet (25 mg total) by mouth at bedtime.   No facility-administered encounter medications on file as of 02/10/2021.    Patient Active Problem List   Diagnosis Date Noted  . Failure to thrive in adult 02/01/2021  . Altered mental status 01/31/2021  . CHB (complete heart block) ([place]) 01/31/2021  . Encephalopathy 01/31/2021  . Generalized anxiety disorder 05/04/2018  . OSA (obstructive sleep apnea) 05/04/2018  . History of stroke 05/04/2018  . Retinal hemorrhage, left eye 05/04/2018  . Late effects of cerebral ischemic stroke 05/04/2018  . Hypothyroidism 05/04/2018  . Permanent atrial fibrillation ([place]) 05/02/2018  . Tachycardia induced cardiomyopathy ([place]) 05/02/2018  . Hypokalemia 05/02/2018  . Constipation 04/25/2018  . Insomnia due to medical condition 02/22/2018  . Depression, major, recurrent, moderate ([place]) 01/16/2018  .  Somatic symptom disorder 01/16/2018  . Vision loss 12/31/2015  . TIA (transient ischemic attack) 12/06/2015  . S/P MVR (mitral valve repair) 11/11/2015  . S/P Minimally invasive maze operation for atrial fibrillation 10/22/2015  . Systolic CHF,  chronic ([place])   . Atrial fibrillation with RVR ([place])   . Centrilobular emphysema ([place])   . Hypotension 11/02/2012  . Stress at home 11/02/2012  . Smoking 04/27/2012  . Hyperlipidemia 04/27/2012  . CAD (coronary artery disease) 04/27/2012  . Heart palpitations 09/03/2011  . Dizziness 05/19/2011  . MVP (mitral valve prolapse) 05/05/2011  . Mitral valve regurgitation 05/05/2011  . Pulmonary HTN ([place]) 05/05/2011    Conditions to be addressed/monitored: Anxiety and Depression; Limited social support, Level of care concerns, Mental Health Concerns  and Social Isolation  Care Plan : General Social Work (Adult)  Updates made by Greg Cutter, LCSW since 02/10/2021 12:00 AM    Problem: Quality of Life (General Plan of Care)     Long-Range Goal: Quality of Life Maintained   Start Date: 02/10/2021  Note:   Evidence-based guidance:   Assess patient's thoughts about quality of life, goals and expectations, and dissatisfaction or desire to improve.   Identify issues of primary importance such as mental health, illness, exercise tolerance, pain, sexual function and intimacy, cognitive change, social isolation, finances and relationships.   Assess and monitor for signs/symptoms of psychosocial concerns, especially depression or ideations regarding harm to others or self; provide or refer for mental health services as needed.   Identify sensory issues that impact quality of life such as hearing loss, vision deficit; strategize ways to maintain or improve hearing, vision.   Promote access to services in the community to support independence such as support groups, home visiting programs, financial assistance, handicapped parking tags, durable medical equipment and emergency responder.   Promote activities to decrease social isolation such as group support or social, leisure and recreational activities, employment, use of social media; consider safety concerns about being out of home for  activities.   Provide patient an opportunity to share by storytelling or a "life review" to give positive meaning to life and to assist with coping and negative experiences.   Encourage patient to tap into hope to improve sense of self.   Counsel based on prognosis and as early as possible about end-of-life and palliative care; consider referral to palliative care provider.   Advocate for the development of palliative care plan that may include avoidance of unnecessary testing and intervention, symptom control, discontinuation of medications, hospice and organ donation.   Counsel as early as possible those with life-limiting chronic disease about palliative care; consider referral to palliative care provider.   Advocate for the development of palliative care plan.   Notes:   Timeframe:  Long-Range Goal Priority:  Medium  Start Date:  02/10/21                         Expected End Date:  05/10/21                    Follow Up Date- 03/24/21  Current Barriers:  . Financial constraints related to managing health care . Housing barriers . ADL IADL limitations . Social Isolation  Clinical Social Work Clinical Goal(s):  [name] Over the next 90 days, client will work with SW to address concerns related to knowledge deficit of appropriate self-care and depression management tools to implement into his daily routine to promote  healthy living   Interventions: . Patient interviewed and appropriate assessments performed . Patient had a recent hospital admission on 01/30/21 for Altered Mental Status. CCM LCSW spoke with patient's spouse on 02/10/21 and she confirms that [facility] is involved and they are coming out 2x per week. [facility] is still coming once per month to complete home visits. . Patient went to his cardiologist appointment yesterday and has an ENT appointment today. Spouse continues to provide stable transportation to all medical appointments for patient. He has a follow up ultrasound on 02/25/21 as  well. . Patient was taken off xanax and was put on different medication which spouse reports is for his sleep. She reports that this adjustment is going well but she will notify CCM LCSW if this changes.  . Per spouse, patient's appetite remains low. CCM LCSW provided education on what healthy self-care looks like . Discussed plans with patient for ongoing care management follow up and provided patient with direct contact information for care management team . Advised patient to consider grief therapy due to the loss he has experienced this year. Patient declined referral but was appreciative of education on resource. UPDATE- Patient declined grief counseling referral again . Assisted patient/caregiver with obtaining information about health plan benefits . Provided education on available community resources within the local area that patient may benefit from. . Provided mental health counseling with regard to his daily difficulty with mobility/weakness/balance. Patient has frequent falls and does not have a medical alert system. LCSW used active and reflective listening and implemented appropriate interventions to help suppport patient and his emotional needs. Advised patient/family to implement deep breathing/grounding/meditation/self-care exercises into his daily routine to combat stressors. Education provided.  . Past update-Patient is 3 months late on rent and is experiencing food insecurity. Patient reports that his financial difficulties have increased his stress, anxiety and depression. [facility] Guide previously worked with patient and completed [employer] application for rent assistance.  Patient Self Care Activities:  . Attends all scheduled provider appointments . Calls provider office for new concerns or questions  Please see past updates related to this goal by clicking on the "Past Updates" button in the selected goal     Task: Support and Maintain Acceptable Degree of Health, Comfort and  Happiness   Note:   Care Management Activities:    - affirmation provided - community involvement promoted - expression of thoughts about present/future encouraged - independence in all possible areas promoted - life review by storytelling encouraged - patient strengths promoted - psychosocial concerns monitored - self-expression encouraged - sleep diary encouraged - sleep hygiene techniques encouraged - social relationships promoted - strategies to maintain hearing and/or vision promoted - strategies to maintain intimacy promoted - wellness behaviors promoted    Notes:       Follow Up Plan: SW will follow up with patient by phone over the next quarter      Eula Fried, [place], MSW, [email or web address] Phone: [phone]

## 2021-02-11 LAB — SURGICAL PATHOLOGY

## 2021-02-12 ENCOUNTER — Other Ambulatory Visit: Payer: Self-pay | Admitting: Family Medicine

## 2021-02-12 ENCOUNTER — Ambulatory Visit: Payer: Self-pay | Admitting: General Practice

## 2021-02-12 DIAGNOSIS — I5032 Chronic diastolic (congestive) heart failure: Secondary | ICD-10-CM

## 2021-02-12 DIAGNOSIS — F32A Depression, unspecified: Secondary | ICD-10-CM | POA: Diagnosis not present

## 2021-02-12 DIAGNOSIS — M6250 Muscle wasting and atrophy, not elsewhere classified, unspecified site: Secondary | ICD-10-CM | POA: Diagnosis not present

## 2021-02-12 DIAGNOSIS — F331 Major depressive disorder, recurrent, moderate: Secondary | ICD-10-CM | POA: Diagnosis not present

## 2021-02-12 DIAGNOSIS — J432 Centrilobular emphysema: Secondary | ICD-10-CM | POA: Diagnosis not present

## 2021-02-12 DIAGNOSIS — G4733 Obstructive sleep apnea (adult) (pediatric): Secondary | ICD-10-CM | POA: Diagnosis not present

## 2021-02-12 DIAGNOSIS — F411 Generalized anxiety disorder: Secondary | ICD-10-CM | POA: Diagnosis not present

## 2021-02-12 DIAGNOSIS — F419 Anxiety disorder, unspecified: Secondary | ICD-10-CM | POA: Diagnosis not present

## 2021-02-12 DIAGNOSIS — G933 Postviral fatigue syndrome: Secondary | ICD-10-CM | POA: Diagnosis not present

## 2021-02-12 DIAGNOSIS — G4700 Insomnia, unspecified: Secondary | ICD-10-CM | POA: Diagnosis not present

## 2021-02-12 DIAGNOSIS — I110 Hypertensive heart disease with heart failure: Secondary | ICD-10-CM | POA: Diagnosis not present

## 2021-02-12 DIAGNOSIS — Z634 Disappearance and death of family member: Secondary | ICD-10-CM

## 2021-02-12 DIAGNOSIS — U099 Post covid-19 condition, unspecified: Secondary | ICD-10-CM | POA: Diagnosis not present

## 2021-02-12 MED ORDER — BUSPIRONE HCL 5 MG PO TABS: 5.0000 mg | ORAL_TABLET | Freq: Three times a day (TID) | ORAL | 1 refills | Status: DC | PRN

## 2021-02-12 NOTE — Patient Instructions (Signed)
Visit Information  PATIENT GOALS: Patient Care Plan: RNCM: Heart Failure (Adult)    Problem Identified: RNCM: Symptom Exacerbation (Heart Failure)   Priority: Medium    Goal: RNCM: Symptom Exacerbation Prevented or Minimized (HF)   Priority: Medium  Note:   Current Barriers:  [name] Knowledge deficits related to basic heart failure pathophysiology and self care management . Unable to independently manage HF  . Unable to self administer medications as prescribed . Lacks social connections . Does not contact provider office for questions/concerns . Lack of scale in home- has scales, does not consistently use  . Financial strain  Occupational hygienist):   Over the next 120 days, patient will weigh self daily and record  Over the next 120 days, patient will verbalize understanding of Heart Failure Action Plan and when to call doctor  Over the next 120 days, patient will take all Heart Failure mediations as prescribed Interventions:  . Collaboration with [name], DO regarding development and update of comprehensive plan of care as evidenced by provider attestation and co-signature . Inter-disciplinary care team collaboration (see longitudinal plan of care) . Basic overview and discussion of pathophysiology of Heart Failure . Provided written and verbal education on low sodium diet . Reviewed Heart Failure Action Plan in depth and provided written copy. 01-28-2021: The patients wife states the cardiologist called yesterday as his heart monitoring system alerted the patient that he was having an exacerbation in his HF. Advised the patient to double up on his Lasix and seek 911 help for worsening condition. Call by the patient to the [facility] asking for assistance. Spoke with the patients wife. The patient is using saline nasal spray to open up sinus congestion but wanted to know if there was something stronger the MD could order. Is open to an appointment with pcp.  Explained to the wife a message would be sent to the provider for recommendations but if the shortness of breath was being created by exacerbation of heart failure then he would likely need to be evaluated in ER and admitted to have IV Lasix. PO lasix increased yesterday and the patient has been going to the bathroom. The RNCM advised the patients wife to take the patient to urgent care or ER for worsening sx and sx of condition.  The wife verbalized understanding.  . Assessed for scales in home- does not consistently use  . Discussed importance of daily weight . Reviewed role of diuretics in prevention of fluid overload- 01-28-2021: The patient is currently taking extra dose of lasix per cardiology for exacerbation in his CHF.  . Collaboration with pcp, pharm D, and clinical staff for assistance with request for something to help with opening up sinus passage to be able to breath better. . Follow up post discharge from the hospital. The patient is feeling much better as far as his heart failure. He is stable and no issues related to his breathing. Is having and exacerbation in his anxiety and depression. Working with the pcp and RNCM to meet expressed needs.   Patient Goals/Self-Care Activities . Over the next 120 days, patient will:  - Take Heart Failure Medications as prescribed - Weigh daily and record (notify MD with 3 lb weight gain over night or 5 lb in a week) - Follow CHF Action Plan - Adhere to low sodium diet - barriers to lifestyle changes reviewed and addressed - barriers to treatment reviewed and addressed - cognitive screening completed and reviewed - depression screen reviewed -  health literacy screening completed or reviewed - healthy lifestyle promoted - rescue (action) plan developed - rescue (action) plan reviewed - self-awareness of signs/symptoms of worsening disease encouraged Follow Up Plan: Telephone follow up appointment with care management team member scheduled for:  03-12-2021 at 2:30 pm   Task: RNCM: Identify and Minimize Risk of Heart Failure Exacerbation   Note:   Care Management Activities:    - barriers to lifestyle changes reviewed and addressed - barriers to treatment reviewed and addressed - cognitive screening completed and reviewed - depression screen reviewed - health literacy screening completed or reviewed - healthy lifestyle promoted - rescue (action) plan developed - rescue (action) plan reviewed - self-awareness of signs/symptoms of worsening disease encouraged       Patient Care Plan: RNCM: Stroke (Adult)    Problem Identified: RNCM: Emotional Adjustment to Disease (Stroke)   Priority: Medium    Long-Range Goal: RNCM: Optimal Coping   Priority: Medium  Note:   Current Barriers:  . Care Coordination needs related to help in the community and resources  in a patient with history of CVA . Chronic Disease Management support and education needs related to post CVA care  . Lacks caregiver support.  . Film/video editor.  . Non-adherence to scheduled provider appointments . Non-adherence to prescribed medication regimen . Unable to independently manage health post CVA . Unable to self administer medications as prescribed . Does not adhere to prescribed medication regimen . Lacks social connections . Unable to perform IADLs independently . Does not maintain contact with provider office . Does not contact provider office for questions/concerns  Nurse Case Manager Clinical Goal(s):  [name] Over the next 120 days, patient will verbalize understanding of plan for effective management of post stroke care . Over the next 120 days, patient will work with [facility], [place] team, and pcp to address needs related to management of chronic condtions  . Over the next 120 days, patient will demonstrate improved adherence to prescribed treatment plan for CVA as evidenced bycompliance with heart healthy diet, taking medications as prescribed, and  working with the CCM team to optimize health and well being.  . Over the next 120 days, patient will verbalize basic understanding of CVA disease process and self health management plan as evidenced by finding new hobbies to help the patient have pleasure and things to look forward to in his life  Interventions:  . 1:1 collaboration with [name], DO regarding development and update of comprehensive plan of care as evidenced by provider attestation and co-signature . Inter-disciplinary care team collaboration (see longitudinal plan of care) . Evaluation of current treatment plan related to post stroke and patient's adherence to plan as established by provider. . Advised patient to set small goals to work toward. The patient has a lot of depression and anxiety over the loss of ability to do things he use to do because of CVA. Also has lost vision in his right eye and he can no longer read and enjoy reading. Also can not shoot pool like he did before have CVA.  The patient feels lost and states he is "living in the past". . Provided education to patient re: alternate activities to enhance mood. Working on strengthen exercises. Options for help in mobility and safety. [name] Collaborated with pcp regarding patient states he still has the infection in his nose and would like to know about an ENT consult from pcp to either [facility] or [place], not the [place] area. Will sent and  in basket message to pcp and inquire  . Discussed plans with patient for ongoing care management follow up and provided patient with direct contact information for care management team  Patient Goals/Self-Care Activities Over the next 120 days, patient will:  - Patient will self administer medications as prescribed Patient will attend all scheduled provider appointments Patient will call pharmacy for medication refills Patient will attend church or other social activities Patient will continue to perform IADL's  independently Patient will call provider office for new concerns or questions Patient will work with BSW to address care coordination needs and will continue to work with the clinical team to address health care and disease management related needs.    - counseling provided - decision-making supported - depression screen reviewed - goal-setting facilitated - positive reinforcement provided - problem-solving facilitated - relaxation techniques promoted - self-care encouraged - self-reflection promoted - verbalization of feelings encouraged Follow Up Plan: Telephone follow up appointment with care management team member scheduled for: 03-12-2021 at 230 pm       Task: RNCM: Support Psychosocial Response to Stroke   Note:   Care Management Activities:    - counseling provided - decision-making supported - depression screen reviewed - goal-setting facilitated - positive reinforcement provided - problem-solving facilitated - relaxation techniques promoted - self-care encouraged - self-reflection promoted - verbalization of feelings encouraged     Patient Care Plan: RNCM: Depression (Adult)    Problem Identified: RNCM: Depression Identification (Depression)   Priority: High    Goal: RNCM: Depressive Symptoms Identified   Priority: High  Note:   Current Barriers:  [name] Knowledge Deficits related to effective ways of decreasing anxiety and depression  . Care Coordination needs related to resources and support  in a patient with depression as a result of loss of independence and unable to do the things he use to do like reading and shooting pool  . Chronic Disease Management support and education needs related to management of depression  . Lacks caregiver support.  . Film/video editor.  . Non-adherence to scheduled provider appointments . Non-adherence to prescribed medication regimen . Unable to independently manage depression and anxiety  . Does not adhere to prescribed  medication regimen . Does not adhere to prescribed psychotropic medication regimen . Lacks social connections . Unable to perform IADLs independently . Does not maintain contact with provider office . Does not contact provider office for questions/concerns  Nurse Case Manager Clinical Goal(s):  [name] Over the next 120 days, patient will verbalize understanding of plan for effective management of depression  . Over the next 120 days, patient will work with [facility], CCM team and pcp to address needs related to depression and patient stating his depression is "bad" . Over the next 120 days, patient will work with CM clinical social worker to assist with depression and anxiety support and education  . Over the next 120 days, the patient will demonstrate ongoing self health care management ability as evidenced by coping with chronic conditions and depression more effectively and finding new hobbies that he enjoys doing and can do independently *  Interventions:  . 1:1 collaboration with [name], DO regarding development and update of comprehensive plan of care as evidenced by provider attestation and co-signature . Inter-disciplinary care team collaboration (see longitudinal plan of care) . Evaluation of current treatment plan related to depression  and patient's adherence to plan as established by provider. . Advised patient to call the office for worsening mood, anxiety, depression. 01-28-2021:  The patient is having an exacerbation in his HF at this time that is also causing increased anxiety and depression. Collaboration with pcp and CCM team. 02-12-2021: The patient reached out to the [facility] because he was very anxious and did not know what to do. The patient states that his mother in law passed away earlier today and his wife is not at home. His daughter is there with his grandchild but not directly with him. He has taken 2 Xanax that he had on hand but they have not helped him at all. He wants  to know if the pcp can give him something to help calm him down because he feels like he is going "crazy". Secure chat to pcp asking or recommendations. The pcp has ordered Buspar 5 mg and he can take 2 to 3 times daily as needed for anxiety. Education and support given to the patient.  . Provided education to patient re: alternate ways to deal with depression and anxiety, safety, concerns about depressed state. 02-12-2021: Review of divisional activities, relaxation, mindfulness . Reviewed medications with patient and discussed the patient does not take anything for depression or anxiety. Discussed medication could help with his depressed symptoms. The patient is not open to taking medications at this time to help with depression. 02-12-2021: Review of Buspar and recommendations by pcp. Education on the patient not taking Xanax as this is something that has not worked for him in the past. The patient has some on hand and did try a couple to see if it would help but it did not. The patient contracted not to take anymore of the Xanax and agrees to take the Buspar. Review of having someone pick it up from the pharmacy for him.  [name] Collaborated with pcp and LCSW  regarding increased depression and ideas or suggestions to help with effective management of depression. 02-12-2021: Will alert the LCSW as well of the changes in events today. The LCSW is working with the patient also.  [name] Provided patient with depression educational materials related to effective management and alternative therapies  . Social Work referral for depression help and recommendations  . Discussed plans with patient for ongoing care management follow up and provided patient with direct contact information for care management team  Patient Goals/Self-Care Activities Over the next 120 days, patient will:  - Patient will self administer medications as prescribed Patient will attend all scheduled provider appointments Patient will call pharmacy  for medication refills Patient will continue to perform IADL's independently Patient will call provider office for new concerns or questions Patient will work with BSW to address care coordination needs and will continue to work with the clinical team to address health care and disease management related needs.   - anxiety screen reviewed - depression screen reviewed - medication list reviewed - participation in psychiatric services encouraged  Follow Up Plan: Telephone follow up appointment with care management team member scheduled for:03-12-2021 at 230 pm         Task: RNCM Identify Depressive Symptoms and Facilitate Treatment   Note:   Care Management Activities:    - anxiety screen reviewed - depression screen reviewed - medication list reviewed - participation in psychiatric services encouraged       Patient Care Plan: General Social Work (Adult)    Problem Identified: Quality of Life (General Plan of Care)     Long-Range Goal: Quality of Life Maintained   Start Date: 02/10/2021  Note:   Evidence-based guidance:  Assess patient's thoughts about quality of life, goals and expectations, and dissatisfaction or desire to improve.   Identify issues of primary importance such as mental health, illness, exercise tolerance, pain, sexual function and intimacy, cognitive change, social isolation, finances and relationships.   Assess and monitor for signs/symptoms of psychosocial concerns, especially depression or ideations regarding harm to others or self; provide or refer for mental health services as needed.   Identify sensory issues that impact quality of life such as hearing loss, vision deficit; strategize ways to maintain or improve hearing, vision.   Promote access to services in the community to support independence such as support groups, home visiting programs, financial assistance, handicapped parking tags, durable medical equipment and emergency responder.   Promote  activities to decrease social isolation such as group support or social, leisure and recreational activities, employment, use of social media; consider safety concerns about being out of home for activities.   Provide patient an opportunity to share by storytelling or a "life review" to give positive meaning to life and to assist with coping and negative experiences.   Encourage patient to tap into hope to improve sense of self.   Counsel based on prognosis and as early as possible about end-of-life and palliative care; consider referral to palliative care provider.   Advocate for the development of palliative care plan that may include avoidance of unnecessary testing and intervention, symptom control, discontinuation of medications, hospice and organ donation.   Counsel as early as possible those with life-limiting chronic disease about palliative care; consider referral to palliative care provider.   Advocate for the development of palliative care plan.   Notes:   Timeframe:  Long-Range Goal Priority:  Medium  Start Date:  02/10/21                         Expected End Date:  05/10/21                    Follow Up Date- 03/24/21  Current Barriers:  . Financial constraints related to managing health care . Housing barriers . ADL IADL limitations . Social Isolation  Clinical Social Work Clinical Goal(s):  [name] Over the next 90 days, client will work with SW to address concerns related to knowledge deficit of appropriate self-care and depression management tools to implement into his daily routine to promote healthy living   Interventions: . Patient interviewed and appropriate assessments performed . Patient had a recent hospital admission on 01/30/21 for Altered Mental Status. CCM LCSW spoke with patient's spouse on 02/10/21 and she confirms that [facility] PT is involved and they are coming out 2x per week. [facility] is still coming once per month to complete home visits. . Patient went to his  cardiologist appointment yesterday and has an ENT appointment today. Spouse continues to provide stable transportation to all medical appointments for patient. He has a follow up ultrasound on 02/25/21 as well. . Patient was taken off xanax and was put on different medication which spouse reports is for his sleep. She reports that this adjustment is going well but she will notify CCM LCSW if this changes.  . Per spouse, patient's appetite remains low. CCM LCSW provided education on what healthy self-care looks like . Discussed plans with patient for ongoing care management follow up and provided patient with direct contact information for care management team . Advised patient to consider grief therapy due to the loss he has experienced this  year. Patient declined referral but was appreciative of education on resource. UPDATE- Patient declined grief counseling referral again . Assisted patient/caregiver with obtaining information about health plan benefits . Provided education on available community resources within the local area that patient may benefit from. . Provided mental health counseling with regard to his daily difficulty with mobility/weakness/balance. Patient has frequent falls and does not have a medical alert system. LCSW used active and reflective listening and implemented appropriate interventions to help suppport patient and his emotional needs. Advised patient/family to implement deep breathing/grounding/meditation/self-care exercises into his daily routine to combat stressors. Education provided.  . Past update-Patient is 3 months late on rent and is experiencing food insecurity. Patient reports that his financial difficulties have increased his stress, anxiety and depression. [facility] Guide previously worked with patient and completed [employer] application for rent assistance.  Patient Self Care Activities:  . Attends all scheduled provider appointments . Calls provider office for new concerns or  questions  Please see past updates related to this goal by clicking on the "Past Updates" button in the selected goal     Task: Support and Maintain Acceptable Degree of Health, Comfort and Happiness   Note:   Care Management Activities:    - affirmation provided - community involvement promoted - expression of thoughts about present/future encouraged - independence in all possible areas promoted - life review by storytelling encouraged - patient strengths promoted - psychosocial concerns monitored - self-expression encouraged - sleep diary encouraged - sleep hygiene techniques encouraged - social relationships promoted - strategies to maintain hearing and/or vision promoted - strategies to maintain intimacy promoted - wellness behaviors promoted    Notes:      Patient verbalizes understanding of instructions provided today and agrees to view in [place].   Telephone follow up appointment with care management team member scheduled for: 03-12-2021 at 230 pm  Noreene Larsson RN, MSN, [place] Mobile: [phone]

## 2021-02-12 NOTE — Chronic Care Management (AMB) (Signed)
Chronic Care Management   CCM RN Visit Note  02/12/2021 Name: Michael Delacruz MRN: [id number] DOB: 1941-02-04  Subjective: Michael Delacruz is a 80 y.o. year old male who is a primary care patient of [name], DO. The care management team was consulted for assistance with disease management and care coordination needs.    Engaged with patient by telephone for follow up visit in response to provider referral for case management and/or care coordination services.   Consent to Services:  The patient was given information about Chronic Care Management services, agreed to services, and gave verbal consent prior to initiation of services.  Please see initial visit note for detailed documentation.   Patient agreed to services and verbal consent obtained.   Assessment: Review of patient past medical history, allergies, medications, health status, including review of consultants reports, laboratory and other test data, was performed as part of comprehensive evaluation and provision of chronic care management services.   SDOH (Social Determinants of Health) assessments and interventions performed:    CCM Care Plan  Allergies  Allergen Reactions  . Codeine Nausea Only  . Digoxin And Related     SOB, bad dreams  . Macrodantin [Nitrofurantoin Macrocrystal] Rash  . Morphine And Related Rash    Outpatient Encounter Medications as of 02/12/2021  Medication Sig  . Acetaminophen (TYLENOL 8 HOUR PO) Take by mouth every 8 (eight) hours. As needed  . busPIRone (BUSPAR) 5 MG tablet Take 1 tablet (5 mg total) by mouth 3 (three) times daily as needed (anxiety).  . furosemide (LASIX) 40 MG tablet Take 1 tablet (40 mg total) by mouth daily as needed (As needed for weight gain or shortness of breath).  . Homeopathic Products (HOMEOPATHIC CALM SL) Place under the tongue. Is using Doterra essential oil of Onguard ingestion daily  . ipratropium (ATROVENT) 0.06 % nasal spray Place 2 sprays into  both nostrils 4 (four) times daily. As needed  . QUEtiapine (SEROQUEL) 25 MG tablet Take 1 tablet (25 mg total) by mouth at bedtime.   No facility-administered encounter medications on file as of 02/12/2021.    Patient Active Problem List   Diagnosis Date Noted  . Failure to thrive in adult 02/01/2021  . Altered mental status 01/31/2021  . CHB (complete heart block) ([place]) 01/31/2021  . Encephalopathy 01/31/2021  . Generalized anxiety disorder 05/04/2018  . OSA (obstructive sleep apnea) 05/04/2018  . History of stroke 05/04/2018  . Retinal hemorrhage, left eye 05/04/2018  . Late effects of cerebral ischemic stroke 05/04/2018  . Hypothyroidism 05/04/2018  . Permanent atrial fibrillation ([place]) 05/02/2018  . Tachycardia induced cardiomyopathy ([place]) 05/02/2018  . Hypokalemia 05/02/2018  . Constipation 04/25/2018  . Insomnia due to medical condition 02/22/2018  . Depression, major, recurrent, moderate ([place]) 01/16/2018  . Somatic symptom disorder 01/16/2018  . Vision loss 12/31/2015  . TIA (transient ischemic attack) 12/06/2015  . S/P MVR (mitral valve repair) 11/11/2015  . S/P Minimally invasive maze operation for atrial fibrillation 10/22/2015  . Systolic CHF, chronic ([place])   . Atrial fibrillation with RVR ([place])   . Centrilobular emphysema ([place])   . Hypotension 11/02/2012  . Stress at home 11/02/2012  . Smoking 04/27/2012  . Hyperlipidemia 04/27/2012  . CAD (coronary artery disease) 04/27/2012  . Heart palpitations 09/03/2011  . Dizziness 05/19/2011  . MVP (mitral valve prolapse) 05/05/2011  . Mitral valve regurgitation 05/05/2011  . Pulmonary HTN ([place]) 05/05/2011    Conditions to be addressed/monitored:CHF, Anxiety and Depression  Care  Plan : RNCM: Heart Failure (Adult)  Updates made by Vanita Ingles since 02/12/2021 12:00 AM    Problem: RNCM: Symptom Exacerbation (Heart Failure)   Priority: Medium    Goal: RNCM: Symptom Exacerbation Prevented or Minimized (HF)    Priority: Medium  Note:   Current Barriers:  [name] Knowledge deficits related to basic heart failure pathophysiology and self care management . Unable to independently manage HF  . Unable to self administer medications as prescribed . Lacks social connections . Does not contact provider office for questions/concerns . Lack of scale in home- has scales, does not consistently use  . Financial strain  Occupational hygienist):   Over the next 120 days, patient will weigh self daily and record  Over the next 120 days, patient will verbalize understanding of Heart Failure Action Plan and when to call doctor  Over the next 120 days, patient will take all Heart Failure mediations as prescribed Interventions:  . Collaboration with [name], DO regarding development and update of comprehensive plan of care as evidenced by provider attestation and co-signature . Inter-disciplinary care team collaboration (see longitudinal plan of care) . Basic overview and discussion of pathophysiology of Heart Failure . Provided written and verbal education on low sodium diet . Reviewed Heart Failure Action Plan in depth and provided written copy. 01-28-2021: The patients wife states the cardiologist called yesterday as his heart monitoring system alerted the patient that he was having an exacerbation in his HF. Advised the patient to double up on his Lasix and seek 911 help for worsening condition. Call by the patient to the [facility] asking for assistance. Spoke with the patients wife. The patient is using saline nasal spray to open up sinus congestion but wanted to know if there was something stronger the MD could order. Is open to an appointment with pcp. Explained to the wife a message would be sent to the provider for recommendations but if the shortness of breath was being created by exacerbation of heart failure then he would likely need to be evaluated in ER and admitted to have IV Lasix. PO  lasix increased yesterday and the patient has been going to the bathroom. The RNCM advised the patients wife to take the patient to urgent care or ER for worsening sx and sx of condition.  The wife verbalized understanding.  . Assessed for scales in home- does not consistently use  . Discussed importance of daily weight . Reviewed role of diuretics in prevention of fluid overload- 01-28-2021: The patient is currently taking extra dose of lasix per cardiology for exacerbation in his CHF.  . Collaboration with pcp, pharm D, and clinical staff for assistance with request for something to help with opening up sinus passage to be able to breath better. . Follow up post discharge from the hospital. The patient is feeling much better as far as his heart failure. He is stable and no issues related to his breathing. Is having and exacerbation in his anxiety and depression. Working with the pcp and RNCM to meet expressed needs.   Patient Goals/Self-Care Activities . Over the next 120 days, patient will:  - Take Heart Failure Medications as prescribed - Weigh daily and record (notify MD with 3 lb weight gain over night or 5 lb in a week) - Follow CHF Action Plan - Adhere to low sodium diet - barriers to lifestyle changes reviewed and addressed - barriers to treatment reviewed and addressed - cognitive screening completed and  reviewed - depression screen reviewed - health literacy screening completed or reviewed - healthy lifestyle promoted - rescue (action) plan developed - rescue (action) plan reviewed - self-awareness of signs/symptoms of worsening disease encouraged Follow Up Plan: Telephone follow up appointment with care management team member scheduled for: 03-12-2021 at 2:30 pm   Care Plan : RNCM: Depression (Adult)  Updates made by Vanita Ingles since 02/12/2021 12:00 AM    Problem: RNCM: Depression Identification (Depression)   Priority: High    Goal: RNCM: Depressive Symptoms Identified    Priority: High  Note:   Current Barriers:  [name] Knowledge Deficits related to effective ways of decreasing anxiety and depression  . Care Coordination needs related to resources and support  in a patient with depression as a result of loss of independence and unable to do the things he use to do like reading and shooting pool  . Chronic Disease Management support and education needs related to management of depression  . Lacks caregiver support.  . Film/video editor.  . Non-adherence to scheduled provider appointments . Non-adherence to prescribed medication regimen . Unable to independently manage depression and anxiety  . Does not adhere to prescribed medication regimen . Does not adhere to prescribed psychotropic medication regimen . Lacks social connections . Unable to perform IADLs independently . Does not maintain contact with provider office . Does not contact provider office for questions/concerns  Nurse Case Manager Clinical Goal(s):  [name] Over the next 120 days, patient will verbalize understanding of plan for effective management of depression  . Over the next 120 days, patient will work with [facility], CCM team and pcp to address needs related to depression and patient stating his depression is "bad" . Over the next 120 days, patient will work with CM clinical social worker to assist with depression and anxiety support and education  . Over the next 120 days, the patient will demonstrate ongoing self health care management ability as evidenced by coping with chronic conditions and depression more effectively and finding new hobbies that he enjoys doing and can do independently *  Interventions:  . 1:1 collaboration with [name], DO regarding development and update of comprehensive plan of care as evidenced by provider attestation and co-signature . Inter-disciplinary care team collaboration (see longitudinal plan of care) . Evaluation of current treatment plan  related to depression  and patient's adherence to plan as established by provider. . Advised patient to call the office for worsening mood, anxiety, depression. 01-28-2021: The patient is having an exacerbation in his HF at this time that is also causing increased anxiety and depression. Collaboration with pcp and CCM team. 02-12-2021: The patient reached out to the [facility] because he was very anxious and did not know what to do. The patient states that his mother in law passed away earlier today and his wife is not at home. His daughter is there with his grandchild but not directly with him. He has taken 2 Xanax that he had on hand but they have not helped him at all. He wants to know if the pcp can give him something to help calm him down because he feels like he is going "crazy". Secure chat to pcp asking or recommendations. The pcp has ordered Buspar 5 mg and he can take 2 to 3 times daily as needed for anxiety. Education and support given to the patient.  . Provided education to patient re: alternate ways to deal with depression and anxiety, safety, concerns about depressed  state. 02-12-2021: Review of divisional activities, relaxation, mindfulness . Reviewed medications with patient and discussed the patient does not take anything for depression or anxiety. Discussed medication could help with his depressed symptoms. The patient is not open to taking medications at this time to help with depression. 02-12-2021: Review of Buspar and recommendations by pcp. Education on the patient not taking Xanax as this is something that has not worked for him in the past. The patient has some on hand and did try a couple to see if it would help but it did not. The patient contracted not to take anymore of the Xanax and agrees to take the Buspar. Review of having someone pick it up from the pharmacy for him.  [name] Collaborated with pcp and LCSW  regarding increased depression and ideas or suggestions to help with effective  management of depression. 02-12-2021: Will alert the LCSW as well of the changes in events today. The LCSW is working with the patient also.  [name] Provided patient with depression educational materials related to effective management and alternative therapies  . Social Work referral for depression help and recommendations  . Discussed plans with patient for ongoing care management follow up and provided patient with direct contact information for care management team  Patient Goals/Self-Care Activities Over the next 120 days, patient will:  - Patient will self administer medications as prescribed Patient will attend all scheduled provider appointments Patient will call pharmacy for medication refills Patient will continue to perform IADL's independently Patient will call provider office for new concerns or questions Patient will work with BSW to address care coordination needs and will continue to work with the clinical team to address health care and disease management related needs.   - anxiety screen reviewed - depression screen reviewed - medication list reviewed - participation in psychiatric services encouraged  Follow Up Plan: Telephone follow up appointment with care management team member scheduled for:03-12-2021 at 230 pm           Plan:Telephone follow up appointment with care management team member scheduled for:  03-12-2021 at 73 pm  Noreene Larsson RN, MSN, [place] Mobile: [phone]

## 2021-02-13 ENCOUNTER — Telehealth: Payer: Self-pay | Admitting: Family Medicine

## 2021-02-13 NOTE — Telephone Encounter (Signed)
Pt miss his PT home health visit due to death in the family

## 2021-02-16 ENCOUNTER — Telehealth (INDEPENDENT_AMBULATORY_CARE_PROVIDER_SITE_OTHER): Payer: Self-pay

## 2021-02-16 NOTE — Telephone Encounter (Signed)
I spoke w/pt to inform him that his surgery had been changed to [facility] 02/24/2021, he is to arrive by 9:20 am. I am sending him notes on this.

## 2021-02-17 ENCOUNTER — Ambulatory Visit (INDEPENDENT_AMBULATORY_CARE_PROVIDER_SITE_OTHER): Payer: Self-pay | Admitting: Otolaryngology

## 2021-02-17 DIAGNOSIS — G4700 Insomnia, unspecified: Secondary | ICD-10-CM | POA: Diagnosis not present

## 2021-02-17 DIAGNOSIS — M6250 Muscle wasting and atrophy, not elsewhere classified, unspecified site: Secondary | ICD-10-CM | POA: Diagnosis not present

## 2021-02-17 DIAGNOSIS — G4733 Obstructive sleep apnea (adult) (pediatric): Secondary | ICD-10-CM | POA: Diagnosis not present

## 2021-02-17 DIAGNOSIS — F32A Depression, unspecified: Secondary | ICD-10-CM | POA: Diagnosis not present

## 2021-02-17 DIAGNOSIS — U099 Post covid-19 condition, unspecified: Secondary | ICD-10-CM | POA: Diagnosis not present

## 2021-02-17 DIAGNOSIS — F411 Generalized anxiety disorder: Secondary | ICD-10-CM | POA: Diagnosis not present

## 2021-02-17 DIAGNOSIS — I110 Hypertensive heart disease with heart failure: Secondary | ICD-10-CM | POA: Diagnosis not present

## 2021-02-17 DIAGNOSIS — G933 Postviral fatigue syndrome: Secondary | ICD-10-CM | POA: Diagnosis not present

## 2021-02-17 DIAGNOSIS — J432 Centrilobular emphysema: Secondary | ICD-10-CM | POA: Diagnosis not present

## 2021-02-17 DIAGNOSIS — C300 Malignant neoplasm of nasal cavity: Secondary | ICD-10-CM

## 2021-02-17 NOTE — H&P (Signed)
PREOPERATIVE H&P  Chief Complaint: Chronic left intranasal sore  HPI: Michael Delacruz is a 80 y.o. male who presents for evaluation of chronic left intranasal sore.  On exam patient has an ulcerative lesion involving the left nasal septum that extends down to the rim of the left nostril.  On clinical exam this is consistent with probable cancer.  Patient's had a previous history of basal cell carcinoma removed from the dorsum of the nose.  A biopsy of the left intranasal mass revealed squamous cell carcinoma.  He is taken to the operating room at this time for wide excision and placement of a full-thickness skin graft.  Past Medical History:  Diagnosis Date  . Aortic dilatation ([place])    a. 11/2018 Echo: Ao root 10mm, Asc Ao 88mm.  . Arthritis   . Asthma   . Bradycardia   . CAD in native artery    a. LHC 09/2015: 40% pCx, 35% mRCA.  [name] COPD (chronic obstructive pulmonary disease) ([place])   . Dilation of intestine 01/2015  . Fibromyalgia   . Frequent headaches   . GERD (gastroesophageal reflux disease)   . History of blood clots    eye   . History of hiatal hernia   . History of rheumatic fever   . Hypertension   . Kidney stone   . NICM (nonischemic cardiomyopathy) ([place])    a. 07/2017 Echo: EF reduced to 40-45%; b. 07/2018 Echo: EF 25-30%; c. 11/2018 Echo: EF 30-35%, diff HK w/ sev inf HK, Ao root 36mm, Asc Ao 76mm, Mild MS (grad 55mmHg), mildly dil LA.  [name] PAF (paroxysmal atrial fibrillation) ([place])    a. s/p TEE/DCCV 07/2015. b. H/o bleeding on Coumadin when INR >5, changed to Eliquis-subsequently discontinued; c. 04/2018 s/p SJM 3562 Quadra Allure MP DC PPM & AVN ablation.  . S/P Minimally invasive maze operation for atrial fibrillation 10/22/2015   Complete bilateral atrial lesion set using cryothermy and bipolar radiofrequency ablation with clipping of LA appendage via right mini thoracotomy approach  . S/P minimally invasive mitral valve repair 10/22/2015   Complex valvuloplasty including  triangular resection of posterior leaflet, artificial Gore-tex neochord placement x6 and 38 mm Sorin Memo 3D Rechord ring annuloplasty via right minithoracotomy approach  . Severe mitral regurgitation s/p MVR    a. s/p MV repair 10/2015; b. 11/2018 Echo: Mild MS (mean grad 79mmHg).  . Sleep apnea   . Stroke ([place])   . Thyroid disorder   . TIA (transient ischemic attack)   . Tobacco abuse    Past Surgical History:  Procedure Laterality Date  . ANKLE SURGERY    . AV NODE ABLATION N/A 05/04/2018   Procedure: AV NODE ABLATION;  Surgeon: Thompson Grayer, MD;  Location: [facility];  Service: Cardiovascular;  Laterality: N/A;  . BIV PACEMAKER INSERTION CRT-P N/A 05/03/2018   Procedure: BIV PACEMAKER INSERTION CRT-P;  Surgeon: Deboraha Sprang, MD;  Location: [facility];  Service: Cardiovascular;  Laterality: N/A;  . CARDIAC CATHETERIZATION N/A 10/03/2015   Procedure: Right and Left Heart Cath and Coronary Angiography;  Surgeon: [name], MD;  Location: [facility];  Service: Cardiovascular;  Laterality: N/A;  . COLONOSCOPY    . ELECTROPHYSIOLOGIC STUDY N/A 08/18/2015   Procedure: CARDIOVERSION;  Surgeon: Wellington Hampshire, MD;  Location: [facility];  Service: Cardiovascular;  Laterality: N/A;  . MINIMALLY INVASIVE MAZE PROCEDURE N/A 10/22/2015   Procedure: MINIMALLY INVASIVE MAZE PROCEDURE;  Surgeon: Rexene Alberts, MD;  Location: [facility];  Service: Open Heart Surgery;  Laterality: N/A;  . MITRAL VALVE REPAIR Right 10/22/2015   Procedure: MINIMALLY INVASIVE MITRAL VALVE REPAIR (MVR);  Surgeon: Rexene Alberts, MD;  Location: [facility];  Service: Open Heart Surgery;  Laterality: Right;  . SINUS EXPLORATION    . TEE WITHOUT CARDIOVERSION N/A 08/18/2015   Procedure: TRANSESOPHAGEAL ECHOCARDIOGRAM (TEE);  Surgeon: Wellington Hampshire, MD;  Location: [facility];  Service: Cardiovascular;  Laterality: N/A;  . TEE WITHOUT CARDIOVERSION N/A 10/22/2015   Procedure: TRANSESOPHAGEAL ECHOCARDIOGRAM  (TEE);  Surgeon: Rexene Alberts, MD;  Location: [facility];  Service: Open Heart Surgery;  Laterality: N/A;   Social History   Socioeconomic History  . Marital status: Married    Spouse name: [name]  . Number of children: 3  . Years of education: GED  . Highest education level: GED or equivalent  Occupational History  . Not on file  Tobacco Use  . Smoking status: Current Every Day Smoker    Packs/day: 0.50    Years: 52.00    Pack years: 26.00    Types: Cigarettes    Start date: 56  . Smokeless tobacco: Current User  . Tobacco comment: Resumed smoking, after quit 01/2018  Vaping Use  . Vaping Use: Never used  Substance and Sexual Activity  . Alcohol use: Not Currently    Alcohol/week: 1.0 standard drink    Types: 1 Standard drinks or equivalent per week    Comment: occasionally drinks a margarita  . Drug use: No  . Sexual activity: Not on file  Other Topics Concern  . Not on file  Social History Narrative   Right handed    2-3 cups coffee per day         Social Determinants of Health   Financial Resource Strain: High Risk  . Difficulty of Paying Living Expenses: Hard  Food Insecurity: Food Insecurity Present  . Worried About Charity fundraiser in the Last Year: Often true  . Ran Out of Food in the Last Year: Often true  Transportation Needs: No Transportation Needs  . Lack of Transportation (Medical): No  . Lack of Transportation (Non-Medical): No  Physical Activity: Inactive  . Days of Exercise per Week: 0 days  . Minutes of Exercise per Session: 0 min  Stress: No Stress Concern Present  . Feeling of Stress : Not at all  Social Connections: Moderately Isolated  . Frequency of Communication with Friends and Family: More than three times a week  . Frequency of Social Gatherings with Friends and Family: More than three times a week  . Attends Religious Services: Never  . Active Member of Clubs or Organizations: No  . Attends Archivist Meetings: Never   . Marital Status: Married   Family History  Problem Relation Age of Onset  . Stroke Mother   . Irregular heart beat Mother   . Heart murmur Brother   . Pulmonary embolism Brother   . Hypertension Other   . Pulmonary embolism Maternal Uncle    Allergies  Allergen Reactions  . Codeine Nausea Only  . Digoxin And Related     SOB, bad dreams  . Macrodantin [Nitrofurantoin Macrocrystal] Rash  . Morphine And Related Rash   Prior to Admission medications   Medication Sig Start Date End Date Taking? Authorizing Provider  Acetaminophen (TYLENOL 8 HOUR PO) Take by mouth every 8 (eight) hours. As needed    [provider]  busPIRone (BUSPAR) 5 MG  tablet Take 1 tablet (5 mg total) by mouth 3 (three) times daily as needed (anxiety). 02/12/21   Karamalegos, Devonne Doughty, DO  furosemide (LASIX) 40 MG tablet Take 1 tablet (40 mg total) by mouth daily as needed (As needed for weight gain or shortness of breath). 02/09/21   Theora Gianotti, NP  Homeopathic Products (HOMEOPATHIC CALM SL) Place under the tongue. Is using Doterra essential oil of Onguard ingestion daily    [provider]  ipratropium (ATROVENT) 0.06 % nasal spray Place 2 sprays into both nostrils 4 (four) times daily. As needed 02/06/21   [name], DO  QUEtiapine (SEROQUEL) 25 MG tablet Take 1 tablet (25 mg total) by mouth at bedtime. 02/06/21   Karamalegos, Devonne Doughty, DO     Positive ROS: Otherwise negative  All other systems have been reviewed and were otherwise negative with the exception of those mentioned in the HPI and as above.  Physical Exam: There were no vitals filed for this visit.  General: Alert, no acute distress Oral: Normal oral mucosa and tonsils Nasal: Patient has a small superficial ulcer that extends to the skin of the columella on the left side and along the inferior aspect of the left nostril.  It extends onto the septum with superficial ulceration but no apparent  involvement of the cartilage.. Neck: No palpable adenopathy or thyroid nodules Ear: Ear canal is clear with normal appearing TMs Cardiovascular: Regular rate and rhythm, no murmur.  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  Plan for wide excision of this with placement of a full-thickness skin graft.   Melony Overly, MD 02/17/2021 11:03 AM

## 2021-02-17 NOTE — H&P (Deleted)
  The note originally documented on this encounter has been moved the the encounter in which it belongs.  

## 2021-02-18 ENCOUNTER — Telehealth: Payer: Self-pay | Admitting: Cardiovascular Disease

## 2021-02-18 ENCOUNTER — Ambulatory Visit (INDEPENDENT_AMBULATORY_CARE_PROVIDER_SITE_OTHER): Payer: Medicare HMO | Admitting: Licensed Clinical Social Worker

## 2021-02-18 DIAGNOSIS — I5032 Chronic diastolic (congestive) heart failure: Secondary | ICD-10-CM | POA: Diagnosis not present

## 2021-02-18 DIAGNOSIS — G4701 Insomnia due to medical condition: Secondary | ICD-10-CM

## 2021-02-18 DIAGNOSIS — F331 Major depressive disorder, recurrent, moderate: Secondary | ICD-10-CM

## 2021-02-18 DIAGNOSIS — F411 Generalized anxiety disorder: Secondary | ICD-10-CM

## 2021-02-18 DIAGNOSIS — Z5941 Food insecurity: Secondary | ICD-10-CM

## 2021-02-18 NOTE — Telephone Encounter (Signed)
   [facility] Pre-operative Risk Assessment    HEARTCARE STAFF: - Please ensure there is not already an duplicate clearance open for this procedure. - Under Visit Info/Reason for Call, type in Other and utilize the format Clearance MM/DD/YY or Clearance TBD. Do not use dashes or single digits. - If request is for dental extraction, please clarify the # of teeth to be extracted.  Request for surgical clearance:  1. What type of surgery is being performed? Excision of internasal cancer w/ full skin graft  2. When is this surgery scheduled? TBD, waiting on clearance  3. What type of clearance is required (medical clearance vs. Pharmacy clearance to hold med vs. Both)? both  4. Are there any medications that need to be held prior to surgery and how long?not noted  5. Practice name and name of physician performing surgery? Dr. Radene Journey  6. What is the office phone number? [phone]   7.   What is the office fax number? [phone]  8.   Anesthesia type (None, local, MAC, general) ? [phone]   Marykay Lex 02/18/2021, 4:19 PM  _________________________________________________________________   (provider comments below)

## 2021-02-18 NOTE — Telephone Encounter (Signed)
Spoke with patient and ICM intro given.  Explained reason for call and h agreed to monthly ICM follow up.  Advised currently the monitor is showing disconnected.  He stated he was just got home and will check to make sure it is plugged in   Will call him back within 10-15 minutes to help troubleshoot disconnected monitor if possible.

## 2021-02-18 NOTE — Chronic Care Management (AMB) (Signed)
Chronic Care Management    Clinical Social Work Note  02/18/2021 Name: Michael Delacruz MRN: [id number] DOB: [birth year]  Michael Delacruz is a 80 y.o. year old male who is a primary care patient of [name], DO. The CCM team was consulted to assist the patient with chronic disease management and/or care coordination needs related to: Mental Health Counseling and Resources.   Engaged with patient by telephone for follow up visit in response to provider referral for social work chronic care management and care coordination services.   Consent to Services:  The patient was given the following information about Chronic Care Management services today, agreed to services, and gave verbal consent: 1. CCM service includes personalized support from designated clinical staff supervised by the primary care provider, including individualized plan of care and coordination with other care providers 2. 24/7 contact phone numbers for assistance for urgent and routine care needs. 3. Service will only be billed when office clinical staff spend 20 minutes or more in a month to coordinate care. 4. Only one practitioner may furnish and bill the service in a calendar month. 5.The patient may stop CCM services at any time (effective at the end of the month) by phone call to the office staff. 6. The patient will be responsible for cost sharing (co-pay) of up to 20% of the service fee (after annual deductible is met). Patient agreed to services and consent obtained.  Patient agreed to services and consent obtained.   Assessment: Review of patient past medical history, allergies, medications, and health status, including review of relevant consultants reports was performed today as part of a comprehensive evaluation and provision of chronic care management and care coordination services.     SDOH (Social Determinants of Health) assessments and interventions performed:    Advanced Directives Status: See Care  Plan for related entries.  CCM Care Plan  Allergies  Allergen Reactions  . Codeine Nausea Only  . Digoxin And Related     SOB, bad dreams  . Macrodantin [Nitrofurantoin Macrocrystal] Rash  . Morphine And Related Rash    Outpatient Encounter Medications as of 02/18/2021  Medication Sig  . Acetaminophen (TYLENOL 8 HOUR PO) Take by mouth every 8 (eight) hours. As needed  . busPIRone (BUSPAR) 5 MG tablet Take 1 tablet (5 mg total) by mouth 3 (three) times daily as needed (anxiety).  . furosemide (LASIX) 40 MG tablet Take 1 tablet (40 mg total) by mouth daily as needed (As needed for weight gain or shortness of breath).  . Homeopathic Products (HOMEOPATHIC CALM SL) Place under the tongue. Is using Doterra essential oil of Onguard ingestion daily  . ipratropium (ATROVENT) 0.06 % nasal spray Place 2 sprays into both nostrils 4 (four) times daily. As needed  . QUEtiapine (SEROQUEL) 25 MG tablet Take 1 tablet (25 mg total) by mouth at bedtime.   No facility-administered encounter medications on file as of 02/18/2021.    Patient Active Problem List   Diagnosis Date Noted  . Failure to thrive in adult 02/01/2021  . Altered mental status 01/31/2021  . CHB (complete heart block) ([place]) 01/31/2021  . Encephalopathy 01/31/2021  . Generalized anxiety disorder 05/04/2018  . OSA (obstructive sleep apnea) 05/04/2018  . History of stroke 05/04/2018  . Retinal hemorrhage, left eye 05/04/2018  . Late effects of cerebral ischemic stroke 05/04/2018  . Hypothyroidism 05/04/2018  . Permanent atrial fibrillation ([place]) 05/02/2018  . Tachycardia induced cardiomyopathy ([place]) 05/02/2018  . Hypokalemia 05/02/2018  . Constipation  04/25/2018  . Insomnia due to medical condition 02/22/2018  . Depression, major, recurrent, moderate ([place]) 01/16/2018  . Somatic symptom disorder 01/16/2018  . Vision loss 12/31/2015  . TIA (transient ischemic attack) 12/06/2015  . S/P MVR (mitral valve repair) 11/11/2015  . S/P  Minimally invasive maze operation for atrial fibrillation 10/22/2015  . Systolic CHF, chronic ([place])   . Atrial fibrillation with RVR ([place])   . Centrilobular emphysema ([place])   . Hypotension 11/02/2012  . Stress at home 11/02/2012  . Smoking 04/27/2012  . Hyperlipidemia 04/27/2012  . CAD (coronary artery disease) 04/27/2012  . Heart palpitations 09/03/2011  . Dizziness 05/19/2011  . MVP (mitral valve prolapse) 05/05/2011  . Mitral valve regurgitation 05/05/2011  . Pulmonary HTN ([place]) 05/05/2011    Conditions to be addressed/monitored: Anxiety and Depression; Mental Health Concerns   Care Plan : General Social Work (Adult)  Updates made by Greg Cutter, LCSW since 02/18/2021 12:00 AM    Problem: Quality of Life (General Plan of Care)     Long-Range Goal: Quality of Life Maintained   Start Date: 02/10/2021  Note:    Timeframe:  Long-Range Goal Priority:  Medium  Start Date:  02/10/21                         Expected End Date:  05/10/21                    Follow Up Date- 03/24/21  Current Barriers:  . Financial constraints related to managing health care . Housing barriers . ADL IADL limitations . Social Isolation  Clinical Social Work Clinical Goal(s):  [name] Over the next 90 days, client will work with SW to address concerns related to knowledge deficit of appropriate self-care and depression management tools to implement into his daily routine to promote healthy living   Interventions: . Patient interviewed and appropriate assessments performed . Patient had a recent hospital admission on 01/30/21 for Altered Mental Status. CCM LCSW spoke with patient's spouse on 02/10/21 and she confirms that [facility] PT is involved and they are coming out 2x per week. [facility] is still coming once per month to complete home visits. . Patient went to his cardiologist appointment and ENT appointment. Spouse continues to provide stable transportation to all medical appointments for patient. He has a follow  up ultrasound on 02/25/21 as well. . Patient was taken off xanax and was put on different medication but they haven't picked it up yet. Patient and spouse confirm that patient is NOT CURRENTLY TAKING BUSPAR. Family will pick up medication this afternoon as they were informed that one had not been filled yet.  . Per spouse, patient's appetite remains low. Patient reports that he has NO taste. CCM LCSW provided education on what healthy self-care looks like and how to improve his eating habits. Patient is actively drinking ensure. . Discussed plans with patient for ongoing care management follow up and provided patient with direct contact information for care management team . Advised patient to consider grief therapy due to the loss he has experienced this year. Patient's mother in law passed away recently. Patient declined referral for telephonic grief counseling but was appreciative of education on resource.  . Patient ask that I send him a text message of his upcoming appointments. Text messages sent on 02/18/21.  . Patient was diagnosed with squamous cell carcinoma and has an excision inner nasal cancer procedure surgery for 02/24/21  . Assisted  patient/caregiver with obtaining information about health plan benefits . Provided education on available community resources within the local area that patient may benefit from. . Provided mental health counseling with regard to his daily difficulty with mobility/weakness/balance. Patient has frequent falls and does not have a medical alert system. LCSW used active and reflective listening and implemented appropriate interventions to help suppport patient and his emotional needs. Advised patient/family to implement deep breathing/grounding/meditation/self-care exercises into his daily routine to combat stressors. Education provided.  . Past update-Patient is 3 months late on rent and is experiencing food insecurity. Patient reports that his financial difficulties  have increased his stress, anxiety and depression. C3 Guide previously worked with patient and completed [employer] application for rent assistance. Patient is now interested in getting food stamps are family is unable to afford groceries. CCM LCSW will place new C3 referral for EBT enrollment assistance on 02/18/21  Patient Self Care Activities:  . Attends all scheduled provider appointments . Calls provider office for new concerns or questions  Please see past updates related to this goal by clicking on the "Past Updates" button in the selected goal        Follow Up Plan: SW will follow up with patient by phone over the next quarter      Eula Fried, [place], MSW, [email or web address] Phone: [phone]

## 2021-02-18 NOTE — Telephone Encounter (Signed)
Spoke with patient and assisted with sending remote transmission.  Received the transmission and reviewed with patient.  Advised report suggesting fluid accumulation since 02/13/2021.  He reports feeling Lydiann Bonif[place] of breath over the last week.  He  recently had COVID which affected his taste and hee has been adding salt to foods so it has some taste.  Education given on importance of limiting salt to 2000 mg daily and fluid intake to 64 ounces a day.  Current weight is 178 lbs and lost weigh during Feb hospitalization.    Advised to take PRN Furosemide 1 tablet daily x 2 days and will recheck fluid levels on 02/20/2021.  Will call to assist manual transmission.

## 2021-02-18 NOTE — Patient Instructions (Signed)
Licensed Clinical Social Worker Visit Information  Goals we discussed today:  Goals Addressed              This Visit's Progress   .  SW-"I need to take better care of myself." (pt-stated)         Timeframe:  Long-Range Goal Priority:  Medium  Start Date:  02/10/21                         Expected End Date:  05/10/21                    Follow Up Date- 03/24/21  Current Barriers:  . Financial constraints related to managing health care . Housing barriers . ADL IADL limitations . Social Isolation  Clinical Social Work Clinical Goal(s):  [name] Over the next 90 days, client will work with SW to address concerns related to knowledge deficit of appropriate self-care and depression management tools to implement into his daily routine to promote healthy living   Interventions: . Patient interviewed and appropriate assessments performed . Patient had a recent hospital admission on 01/30/21 for Altered Mental Status. CCM LCSW spoke with patient's spouse on 02/10/21 and she confirms that [facility] PT is involved and they are coming out 2x per week. [facility] is still coming once per month to complete home visits. . Patient went to his cardiologist appointment and ENT appointment. Spouse continues to provide stable transportation to all medical appointments for patient. He has a follow up ultrasound on 02/25/21 as well. . Patient was taken off xanax and was put on different medication but they haven't picked it up yet. Patient and spouse confirm that patient is NOT CURRENTLY TAKING BUSPAR. Family will pick up medication this afternoon as they were informed that one had not been filled yet.  . Per spouse, patient's appetite remains low. Patient reports that he has NO taste. CCM LCSW provided education on what healthy self-care looks like and how to improve his eating habits. Patient is actively drinking ensure. . Discussed plans with patient for ongoing care management follow up and provided patient with direct  contact information for care management team . Advised patient to consider grief therapy due to the loss he has experienced this year. Patient's mother in law passed away recently. Patient declined referral for telephonic grief counseling but was appreciative of education on resource.  . Patient ask that I send him a text message of his upcoming appointments. Text messages sent on 02/18/21.  . Patient was diagnosed with squamous cell carcinoma and has an excision inner nasal cancer procedure surgery for 02/24/21  . Assisted patient/caregiver with obtaining information about health plan benefits . Provided education on available community resources within the local area that patient may benefit from. . Provided mental health counseling with regard to his daily difficulty with mobility/weakness/balance. Patient has frequent falls and does not have a medical alert system. LCSW used active and reflective listening and implemented appropriate interventions to help suppport patient and his emotional needs. Advised patient/family to implement deep breathing/grounding/meditation/self-care exercises into his daily routine to combat stressors. Education provided.  . Past update-Patient is 3 months late on rent and is experiencing food insecurity. Patient reports that his financial difficulties have increased his stress, anxiety and depression. C3 Guide previously worked with patient and completed [employer] application for rent assistance. Patient is now interested in getting food stamps are family is unable to afford groceries. CCM LCSW will place  new C3 referral for EBT enrollment assistance on 02/18/21  Patient Self Care Activities:  . Attends all scheduled provider appointments . Calls provider office for new concerns or questions  Please see past updates related to this goal by clicking on the "Past Updates" button in the selected goal          Eula Fried, [place], MSW, [email or web address] Phone: [phone]

## 2021-02-18 NOTE — Progress Notes (Signed)
Patient has multiple comorbidities including NICM with EF 30-35%, a-fib, pacemaker COPD, s/p MVR, Maze, OSA no CPAP. He was hospitalied for 4d in Feb with covid pneumonia, weakness and failure to thrive. Due to [facility] guidelines pt will need to be done at main or. Pam at [facility] office made aware.

## 2021-02-19 NOTE — Telephone Encounter (Signed)
   Primary Cardiologist: Ida Rogue, MD  Chart reviewed as part of pre-operative protocol coverage. Patient with complex PMH and recent seen by Murray Hodgkins, NP on 02/09/21. He was relatively doing well. Clearance based on echo 02/25/21.   [place], [place] 02/19/2021, 8:53 AM

## 2021-02-19 NOTE — Telephone Encounter (Signed)
Clearance ordering doctor called back with surgery date: March 16 at [facility]

## 2021-02-20 ENCOUNTER — Telehealth: Payer: Self-pay | Admitting: Family Medicine

## 2021-02-20 ENCOUNTER — Other Ambulatory Visit (HOSPITAL_COMMUNITY): Payer: Medicare HMO

## 2021-02-20 ENCOUNTER — Telehealth (INDEPENDENT_AMBULATORY_CARE_PROVIDER_SITE_OTHER): Payer: Self-pay

## 2021-02-20 ENCOUNTER — Telehealth: Payer: Self-pay

## 2021-02-20 ENCOUNTER — Ambulatory Visit (INDEPENDENT_AMBULATORY_CARE_PROVIDER_SITE_OTHER): Payer: Medicare HMO

## 2021-02-20 ENCOUNTER — Other Ambulatory Visit (INDEPENDENT_AMBULATORY_CARE_PROVIDER_SITE_OTHER): Payer: Self-pay

## 2021-02-20 DIAGNOSIS — Z950 Presence of cardiac pacemaker: Secondary | ICD-10-CM | POA: Diagnosis not present

## 2021-02-20 DIAGNOSIS — I5020 Unspecified systolic (congestive) heart failure: Secondary | ICD-10-CM

## 2021-02-20 MED ORDER — IPRATROPIUM BROMIDE 0.02 % IN SOLN: 0.5000 mg | Freq: Four times a day (QID) | RESPIRATORY_TRACT | 12 refills | Status: DC

## 2021-02-20 NOTE — Telephone Encounter (Signed)
ICM call to patient. Pt reports shortness of breath continues despite taking PRN Furosemide x 2 days.  ICM intro given on 3/2 and based on Covue 3/2 fluid level reading and SOB, advised patient to take PRN Furosemide 3/2 & 3/3.    Patient reports today, 02/20/2021, no increase in urination from PRN Furosemide and shortness of breath remains unchanged.  Patient does admit to using salt shaker and does not limit salt intake to the recommended 2000 mg daily.  Patient having nasal surgery for cancer on 03/04/2021.   CorVue thoracic impedance suggesting possible fluid accumulation starting 02/14/21 and no improvement after taking PRN Furosemide x 2 days.   Prescribed:  Furosemide 40 mg Take 1 tablet (40 mg total) by mouth daily as needed (As needed for weight gain or shortness of breath).  Recommendations: Reinforced limiting salt intake to < 2000 mg daily.  Advised to take PRN Furosemide 1 tablet x next 3 days.  Follow-up plan: ICM clinic phone appointment on 3/8//2022 to recheck fluid levels.   Copy sent for Dr Olin Pia review and if any further recommendations will call patient back..   3 month ICM trend: 02/20/2021.

## 2021-02-20 NOTE — Telephone Encounter (Addendum)
See ICM 02/20/2021 Phone note for updated CorVue reading.

## 2021-02-20 NOTE — Telephone Encounter (Signed)
Up to the patient ultimately. I would ask if they can try to re-schedule  Nobie Putnam, [facility] 02/20/2021, 11:39 AM

## 2021-02-20 NOTE — Telephone Encounter (Signed)
I spoke w/pt's wife today. He had gotten changed date mixed up. Informed her that new date is 03/04/2020 at [facility] and we are waiting on Cardiac Clearance to be able to do surgery under general anesthesia.

## 2021-02-20 NOTE — Telephone Encounter (Signed)
Michael Delacruz, from [facility], calling stating that the pt missed a home PT visit this morning. He states that he declined due to scheduling issues. Please advise.      [phone]

## 2021-02-20 NOTE — Progress Notes (Signed)
EPIC Encounter for ICM Monitoring  Patient Name: Michael Delacruz is a 80 y.o. male Date: 02/20/2021 Primary Care Physican: [name], DO Primary Cardiologist: Caryl Comes Electrophysiologist: Vergie Living Pacing: 93%  02/20/2021 Weight:174-178 lbs        Heart Failure questions reviewed.  Pt reports continued shortness of breath.  ICM intro given on 3/2 and based on Covue 3/2 fluid level reading and SOB, advised patient to take PRN Furosemide 3/2 & 3/3.  Patient reports today, no increase in urination from PRN Furosemide and shortness of breath remains unchanged.  Patient does admit to using salt shaker and does not limit salt intake to the recommended 2000 mg daily.  Patient having nasal surgery for cancer on 03/04/2021.   CorVue thoracic impedance suggesting possible fluid accumulation starting 02/14/21 and no improvement after taking PRN Furosemide x 2 days.   Prescribed:  Furosemide 40 mg Take 1 tablet (40 mg total) by mouth daily as needed (As needed for weight gain or shortness of breath).  Recommendations: Reinforced limiting salt intake to < 2000 mg daily.  Advised to take PRN Furosemide 1 tablet x next 3 days.  Follow-up plan: ICM clinic phone appointment on 3/8//2022 to recheck fluid levels.   91 day device clinic remote transmission 04/28/2021.    EP/Cardiology Office Visits: 05/11/2021 with Dr. [name].    Copy of ICM check sent to Dr. Caryl Comes.  Phone note sent for Dr Olin Pia review.   3 month ICM trend: 02/20/2021.    1 Year ICM trend:       [name], RN 02/20/2021 9:08 AM

## 2021-02-23 ENCOUNTER — Telehealth: Payer: Self-pay | Admitting: Family Medicine

## 2021-02-23 NOTE — Telephone Encounter (Signed)
   Telephone encounter was:  Successful.  02/23/2021 Name: Michael Delacruz MRN: [id number] DOB: February 26, 1941  Michael Delacruz is a 80 y.o. year old male who is a primary care patient of [name], DO . The community resource team was consulted for assistance with Food Insecurity and Financial Difficulties related to paying rent and bills.   Care guide performed the following interventions: Patient provided with information about care guide support team and interviewed to confirm resource needs Investigation of community resources performed Discussed resources to assist with food insecurities and income based housing. I spoke to both patient and wife and got an email for [name] his wife. They have met and exceeded their $500 limit with [employer] and they are living in the house they have now because of the two dogs, they pay over $1000 a month and make $1875 between them. I suggested applying for food stamps, looking at income based housing in their area that can take dogs and also gave them a list of local food banks in their area. I emailed all of these things to his wife at [email or web address].  Follow Up Plan:  No further follow up planned at this time. The patient has been provided with needed resources.  [place], Care Management Phone: [phone] Email: [email or web address]

## 2021-02-24 ENCOUNTER — Telehealth: Payer: Self-pay | Admitting: Family Medicine

## 2021-02-24 NOTE — Telephone Encounter (Signed)
L lets try 80 mg po qd x 3 d Thanks SK

## 2021-02-24 NOTE — Telephone Encounter (Signed)
[facility] called to verify if the office received a fax for plan of care from 02/06/21.  Stated that they faxed multiple pages on 02/13/21 to the office.  Please call to verify it was received and to discuss further.  CB# [phone], ext. [phone]

## 2021-02-24 NOTE — Telephone Encounter (Signed)
Attempted call to patient and left message with direct number for return call

## 2021-02-24 NOTE — Telephone Encounter (Addendum)
Spoke with patient.  Advised Dr Caryl Comes recommended he take Furosemide 2 tablets (80 mg total) x 3 days and then after 3rd day return to PRN.  Rechecked remote transmission fluid levels today before providing recommendation and CorVue thoracic impedance suggesting possible fluid accumulation worsen than previous report.  Discussed at length and patient admits to eating most fast foods.  Advised restaurant foods are typically high in salt and he is probably getting 2 to 3 times the amount of salt daily than recommended for heart patients. Advised to limit salt intake to 2000 mg daily and avoid restaurant foods if possible.   Pt reports 10 lb weight gain since 02/02/2021 hospital discharge.  Baseline weight was 174 lbs and current weight is 184 lbs.  Will recheck fluid levels on 03/02/2021  02/24/2021 Corvue Thoracic impedance worse than previous report.

## 2021-02-25 ENCOUNTER — Telehealth: Payer: Self-pay | Admitting: Family Medicine

## 2021-02-25 ENCOUNTER — Ambulatory Visit (INDEPENDENT_AMBULATORY_CARE_PROVIDER_SITE_OTHER): Payer: Medicare HMO

## 2021-02-25 ENCOUNTER — Other Ambulatory Visit: Payer: Self-pay

## 2021-02-25 DIAGNOSIS — I428 Other cardiomyopathies: Secondary | ICD-10-CM | POA: Diagnosis not present

## 2021-02-25 LAB — ECHOCARDIOGRAM COMPLETE
AR max vel: 3.45 cm2
AV Area VTI: 3.4 cm2
AV Area mean vel: 3.3 cm2
AV Mean grad: 2 mmHg
AV Peak grad: 3.6 mmHg
Ao pk vel: 0.95 m/s
Area-P 1/2: 2.56 cm2
Calc EF: 44.1 %
MV VTI: 1.15 cm2
Single Plane A2C EF: 44.8 %
Single Plane A4C EF: 40.8 %

## 2021-02-25 NOTE — Telephone Encounter (Signed)
Legrand Como PTA with [facility] is calling to report the pt had missed PT visit due to he was unavailable

## 2021-02-26 ENCOUNTER — Encounter: Payer: Self-pay | Admitting: *Deleted

## 2021-02-26 ENCOUNTER — Telehealth: Payer: Self-pay | Admitting: *Deleted

## 2021-02-26 NOTE — Telephone Encounter (Signed)
Reviewed results with patient and he verbalized understanding. He did request report to be mailed to him. Printed report and placed in outgoing mail. He was very appreciative for the call with no further questions at this time.

## 2021-02-26 NOTE — Telephone Encounter (Signed)
-----   Message from Theora Gianotti, NP sent at 02/26/2021  9:02 AM EST ----- EF 45-50%, which is still not normal, but much improved from prior recording (was 30-35% 11/2018).  Mitral valve is mildly leaky.  The left atrium, which is the top chamber on the left side, is severely dilated, which is a common finding in someone with permanent atrial fibrillation, which he has.  The Aorta is moderately dilated @ 4.7 cm.  This is measured as 0.4cm greater than what was seen on CT in November 2021.  Given two different imaging modalities, it's difficult to know if this has truly grown in size.  Either way, we would plan a f/u CTA of the chest to assess his Aortic size within the next year.  He can discuss further with Dr. [name] at May visit.

## 2021-02-27 DIAGNOSIS — J432 Centrilobular emphysema: Secondary | ICD-10-CM | POA: Diagnosis not present

## 2021-02-27 DIAGNOSIS — G933 Postviral fatigue syndrome: Secondary | ICD-10-CM | POA: Diagnosis not present

## 2021-02-27 DIAGNOSIS — G4733 Obstructive sleep apnea (adult) (pediatric): Secondary | ICD-10-CM | POA: Diagnosis not present

## 2021-02-27 DIAGNOSIS — I110 Hypertensive heart disease with heart failure: Secondary | ICD-10-CM | POA: Diagnosis not present

## 2021-02-27 DIAGNOSIS — U099 Post covid-19 condition, unspecified: Secondary | ICD-10-CM | POA: Diagnosis not present

## 2021-02-27 DIAGNOSIS — F411 Generalized anxiety disorder: Secondary | ICD-10-CM | POA: Diagnosis not present

## 2021-02-27 DIAGNOSIS — G4700 Insomnia, unspecified: Secondary | ICD-10-CM | POA: Diagnosis not present

## 2021-02-27 DIAGNOSIS — F32A Depression, unspecified: Secondary | ICD-10-CM | POA: Diagnosis not present

## 2021-02-27 DIAGNOSIS — M6250 Muscle wasting and atrophy, not elsewhere classified, unspecified site: Secondary | ICD-10-CM | POA: Diagnosis not present

## 2021-02-27 NOTE — Telephone Encounter (Signed)
   Primary Cardiologist: Ida Rogue, MD  Chart reviewed as part of pre-operative protocol coverage. Given past medical history and time since last visit, based on ACC/AHA guidelines, DALESSANDRO BALDYGA would be at acceptable risk for the planned procedure without further cardiovascular testing.   I will route this recommendation to the requesting party via Epic fax function and remove from pre-op pool.  Please call with questions.  Jossie Ng. Danelly Hassinger NP-C    02/27/2021, 3:59 PM [facility] [place] [address] Office [phone] Fax [phone]

## 2021-03-02 ENCOUNTER — Telehealth: Payer: Self-pay

## 2021-03-02 NOTE — Telephone Encounter (Signed)
Attempted ICM Call and left message to return call.

## 2021-03-03 ENCOUNTER — Encounter: Payer: Self-pay | Admitting: Internal Medicine

## 2021-03-03 ENCOUNTER — Encounter (HOSPITAL_COMMUNITY): Payer: Self-pay | Admitting: Otolaryngology

## 2021-03-03 NOTE — Progress Notes (Signed)
Anesthesia Chart Review: Same day workup  Follows with cardiology for history of severe mitral regurgitation status post mitral valve repair (November 2016), moderate pulmonary hypertension, atrial fibrillation/flutter status post placement of biventricular Medtronic PPM and AV nodal ablation, nonischemic cardiomyopathy and HFrEF with an EF of 45 to 50% (by echo 02/25/2021), retinal bleeding exacerbated by oral anticoagulation requiring surgery x2 with residual right eye vision deficits, nonobstructive CAD, hypertension.  Cleared for surgery per telephone encounter Salome Spotted, NP on 02/27/2021, "Chart reviewed as part of pre-operative protocol coverage. Given past medical history and time since last visit, based on ACC/AHA guidelines, Michael Delacruz would be at acceptable risk for the planned procedure without further cardiovascular testing."  Patient will need day of surgery labs and evaluation.  EKG 02/09/2021: Ventricular paced rhythm with intrinsic complexes.  Biventricular pacemaker detected.  Rate 83.  TTE 02/25/2021: 1. Left ventricular ejection fraction, by estimation, is 45 to 50%. The  left ventricle has mildly decreased function. The left ventricle  demonstrates regional wall motion abnormalities (Septal and apical septal  hypokinesis, possibly secondary to  conduction abnormality). Left ventricular diastolic parameters are  indeterminate.  2. Right ventricular systolic function is normal. The right ventricular  size is normal.  3. The mitral valve was not well visualized. Mitral valve repair noted.  Mild mitral valve regurgitation. No evidence of mitral stenosis. The mean  mitral valve gradient is 5.0 mmHg.  4. Left atrial size was severely dilated.  5. Right atrial size was moderately dilated.  6. There is moderate dilatation of the aortic root, measuring 47 mm.  Ascending aorta not well visualized.   Comparison(s): EF 30-35%, diffuse hypokinesis, history of mitral valve   repair MAZE procedure with mild mitral stenosis. Aortic root 4.3 cm.  descending aorta 4.1 cm.   CT chest lung cancer screening 10/27/2020: IMPRESSION: 1. New 5.1 mm inferior lingular nodule. Lung-RADS 3, probably benign findings. Short-term follow-up in 6 months is recommended with repeat low-dose chest CT without contrast (please use the following order, "CT CHEST LCS NODULE FOLLOW-UP W/O CM"). These results will be called to the ordering clinician or representative by the Radiologist Assistant, and communication documented in the PACS or [employer]. 2. Ascending Aortic aneurysm NOS (ICD10-I71.9), stable. Recommend annual imaging followup by CTA or MRA. This recommendation follows 2010 ACCF/AHA/AATS/ACR/ASA/SCA/SCAI/SIR/STS/SVM Guidelines for the Diagnosis and Management of Patients with Thoracic Aortic Disease. Circulation. 2010; 121: L935-T017. Aortic aneurysm NOS (ICD10-I71.9). 3. Tiny left renal stone. 4. Aortic atherosclerosis (ICD10-I70.0). Coronary artery calcification.  5.  Emphysema (ICD10-J43.9).    Wynonia Musty [facility] Short Stay Center/Anesthesiology Phone [phone] 03/03/2021 1:43 PM

## 2021-03-03 NOTE — Progress Notes (Signed)
Spoke with pt for pre-op call. Pt has a cardiac history. Pt's cardiologist is Dr. [name]. Cardiac clearance in chart and in Epic. Pt does have a pacemaker, Medtronic- sent info to the Device clinic. See below. Emailed Gae Dry, rep for Medtronic  Pt will need Covid test on arrival in AM.            [place]  Patient Information: Name:Michael Delacruz, Michael Delacruz  DOB:Jun 30, 1941   QQI:[id number]  Planned Procedure: Excision of left intranasal cancer with placement of full thickness skin graft  Surgeon: Dr. Radene Journey  Date of Procedure: 03/04/21  Cautery will be used.  Position during surgery: supine   Please send documentation back to:  [facility] (Fax # [phone])   Sheela Stack, RN  03/03/2021 8:37 AM      Device Information:  Clinic EP Physician:Steven Caryl Comes, MD Device Type:Pacemaker Manufacturer and Phone #:St. Jude/[employer]ott: [phone] Pacemaker Dependent?:No Date of Last Device Check:3/8/2022Normal Device Function?:Yes  Electrophysiologist's Recommendations:   Have magnet available.  Provide continuous ECG monitoring when magnet is used or reprogramming is to be performed.  Procedure will likely interfere with device function.  Device should be programmed:  Asynchronous pacing during procedure and returned to normal programming after procedure.  Per Device Clinic Standing Orders, Drake Leach  03/03/2021 3:35 PM      Documentation

## 2021-03-03 NOTE — Progress Notes (Signed)
PERIOPERATIVE PRESCRIPTION FOR IMPLANTED CARDIAC DEVICE PROGRAMMING   Patient Information: Name: Michael Delacruz, Michael Delacruz  DOB: [birth year]   MRN: [id number]  Planned Procedure: Excision of left intranasal cancer with placement of full thickness skin graft  Surgeon: Dr. Radene Journey  Date of Procedure: 03/04/21  Cautery will be used.  Position during surgery: supine   Please send documentation back to:  [facility] (Fax # [phone])   Sheela Stack, RN  03/03/2021 8:37 AM       Device Information:   Clinic EP Physician:   Virl Axe, MD Device Type:  Pacemaker Manufacturer and Phone #:  [employer]. Jude/[employer]ott: [phone] Pacemaker Dependent?:  No Date of Last Device Check:  02/24/2021        Normal Device Function?:  Yes     Electrophysiologist's Recommendations:    Have magnet available.  Provide continuous ECG monitoring when magnet is used or reprogramming is to be performed.   Procedure will likely interfere with device function.  Device should be programmed:  Asynchronous pacing during procedure and returned to normal programming after procedure.  Per Device Clinic Standing Orders, Drake Leach  03/03/2021 3:35 PM

## 2021-03-03 NOTE — Anesthesia Preprocedure Evaluation (Addendum)
Anesthesia Evaluation  Patient identified by MRN, date of birth, ID band Patient awake    Reviewed: Allergy & Precautions, NPO status , Patient's Chart, lab work & pertinent test results  History of Anesthesia Complications Negative for: history of anesthetic complications  Airway Mallampati: I  TM Distance: >3 FB Neck ROM: Full    Dental  (+) Upper Dentures, Lower Dentures, Dental Advisory Given   Pulmonary asthma , sleep apnea , COPD, Current Smoker and Patient abstained from smoking.,  Covid-19 Nucleic Acid Test Results Lab Results      Component                Value               Date                      [place]              NEGATIVE            03/04/2021                [place]              NEGATIVE            01/30/2021              breath sounds clear to auscultation       Cardiovascular hypertension, + CAD and +CHF  + dysrhythmias + Valvular Problems/Murmurs MR  Rhythm:Regular  1. Left ventricular ejection fraction, by estimation, is 45 to 50%. The  left ventricle has mildly decreased function. The left ventricle  demonstrates regional wall motion abnormalities (Septal and apical septal  hypokinesis, possibly secondary to  conduction abnormality). Left ventricular diastolic parameters are  indeterminate.  2. Right ventricular systolic function is normal. The right ventricular  size is normal.  3. The mitral valve was not well visualized. Mitral valve repair noted.  Mild mitral valve regurgitation. No evidence of mitral stenosis. The mean  mitral valve gradient is 5.0 mmHg.  4. Left atrial size was severely dilated.  5. Right atrial size was moderately dilated.  6. There is moderate dilatation of the aortic root, measuring 47 mm.  Ascending aorta not well visualized.    Prox Cx lesion, 40% stenosed.  Mid RCA lesion, 35% stenosed.  The left ventricular systolic function is normal.      Neuro/Psych  Headaches, PSYCHIATRIC DISORDERS Anxiety Depression TIA Neuromuscular disease CVA    GI/Hepatic Neg liver ROS, hiatal hernia, GERD  ,  Endo/Other  Hypothyroidism Lab Results      Component                Value               Date                      HGBA1C                   5.5                 05/01/2018             Renal/GU negative Renal ROSLab Results      Component                Value               Date  CREATININE               1.12                03/04/2021                Musculoskeletal  (+) Arthritis , Fibromyalgia -  Abdominal   Peds  Hematology negative hematology ROS (+) Lab Results      Component                Value               Date                      WBC                      4.7                 03/04/2021                HGB                      13.2                03/04/2021                HCT                      40.1                03/04/2021                MCV                      95.7                03/04/2021                PLT                      165                 03/04/2021              Anesthesia Other Findings Pacer dependent  Reproductive/Obstetrics                         Anesthesia Physical Anesthesia Plan  ASA: III  Anesthesia Plan: General   Post-op Pain Management:    Induction: Intravenous  PONV Risk Score and Plan: 2 and Ondansetron, Dexamethasone and Midazolam  Airway Management Planned: Oral ETT  Additional Equipment: None  Intra-op Plan:   Post-operative Plan: Extubation in OR  Informed Consent: I have reviewed the patients History and Physical, chart, labs and discussed the procedure including the risks, benefits and alternatives for the proposed anesthesia with the patient or authorized representative who has indicated his/her understanding and acceptance.     Dental advisory given  Plan Discussed with: CRNA and Anesthesiologist  Anesthesia Plan Comments:  (PAT note by Karoline Caldwell, PA-C: Follows with cardiology for history of severe mitral regurgitation status post mitral valve repair (November 2016), moderate pulmonary hypertension, atrial fibrillation/flutter status post placement of biventricular Medtronic PPM and AV nodal ablation, nonischemic cardiomyopathy and HFrEF with an EF of 45 to 50% (by echo 02/25/2021), retinal bleeding exacerbated by oral anticoagulation requiring surgery x2 with  residual right eye vision deficits, nonobstructive CAD, hypertension.  Cleared for surgery per telephone encounter Salome Spotted, NP on 02/27/2021, "Chart reviewed as part of pre-operative protocol coverage. Given past medical history and time since last visit, based on ACC/AHA guidelines, NEILSON OEHLERT would be at acceptable risk for the planned procedure without further cardiovascular testing."  Patient will need day of surgery labs and evaluation.  EKG 02/09/2021: Ventricular paced rhythm with intrinsic complexes.  Biventricular pacemaker detected.  Rate 83.  TTE 02/25/2021: 1. Left ventricular ejection fraction, by estimation, is 45 to 50%. The  left ventricle has mildly decreased function. The left ventricle  demonstrates regional wall motion abnormalities (Septal and apical septal  hypokinesis, possibly secondary to  conduction abnormality). Left ventricular diastolic parameters are  indeterminate.  2. Right ventricular systolic function is normal. The right ventricular  size is normal.  3. The mitral valve was not well visualized. Mitral valve repair noted.  Mild mitral valve regurgitation. No evidence of mitral stenosis. The mean  mitral valve gradient is 5.0 mmHg.  4. Left atrial size was severely dilated.  5. Right atrial size was moderately dilated.  6. There is moderate dilatation of the aortic root, measuring 47 mm.  Ascending aorta not well visualized.   Comparison(s): EF 30-35%, diffuse hypokinesis, history of mitral valve  repair MAZE  procedure with mild mitral stenosis. Aortic root 4.3 cm.  descending aorta 4.1 cm.   CT chest lung cancer screening 10/27/2020: IMPRESSION: 1. New 5.1 mm inferior lingular nodule. Lung-RADS 3, probably benign findings. Short-term follow-up in 6 months is recommended with repeat low-dose chest CT without contrast (please use the following order, "CT CHEST LCS NODULE FOLLOW-UP W/O CM"). These results will be called to the ordering clinician or representative by the Radiologist Assistant, and communication documented in the PACS or [employer]. 2. Ascending Aortic aneurysm NOS (ICD10-I71.9), stable. Recommend annual imaging followup by CTA or MRA. This recommendation follows 2010 ACCF/AHA/AATS/ACR/ASA/SCA/SCAI/SIR/STS/SVM Guidelines for the Diagnosis and Management of Patients with Thoracic Aortic Disease. Circulation. 2010; 121: J825-K539. Aortic aneurysm NOS (ICD10-I71.9). 3. Tiny left renal stone. 4. Aortic atherosclerosis (ICD10-I70.0). Coronary artery calcification.  5.  Emphysema (ICD10-J43.9). )      Anesthesia Quick Evaluation

## 2021-03-03 NOTE — Telephone Encounter (Signed)
Returned patient call per voice mail message.  Spoke with patient and attempted to assist in sending manual transmission to recheck fluid levels but unsuccessful.  He reported he took PRN Furosemide as instructed by Dr Caryl Comes.  Provided [employer] number and advised to call when he has time.  Will attempt to recheck fluid levels 03/09/2021.  Pt scheduled for nasal surgery tomorrow.  He asked about clearance for the surgery and advised that his surgeon's office obtain's surgerical clearance by Dr Donivan Scull office and he should ask surgeon's office if it was received.

## 2021-03-04 ENCOUNTER — Ambulatory Visit (HOSPITAL_COMMUNITY): Payer: Medicare HMO | Admitting: Physician Assistant

## 2021-03-04 ENCOUNTER — Other Ambulatory Visit: Payer: Self-pay

## 2021-03-04 ENCOUNTER — Encounter (HOSPITAL_COMMUNITY): Payer: Self-pay | Admitting: Otolaryngology

## 2021-03-04 ENCOUNTER — Ambulatory Visit (HOSPITAL_COMMUNITY)
Admission: RE | Admit: 2021-03-04 | Discharge: 2021-03-04 | Disposition: A | Discharge status: Home / Self Care | Payer: Medicare HMO | Attending: Otolaryngology | Admitting: Otolaryngology

## 2021-03-04 ENCOUNTER — Ambulatory Visit (INDEPENDENT_AMBULATORY_CARE_PROVIDER_SITE_OTHER): Payer: Self-pay | Admitting: Otolaryngology

## 2021-03-04 ENCOUNTER — Encounter (HOSPITAL_COMMUNITY)
Admission: RE | Disposition: A | Discharge status: Home / Self Care | Payer: Self-pay | Source: Home / Self Care | Attending: Otolaryngology

## 2021-03-04 DIAGNOSIS — Z86718 Personal history of other venous thrombosis and embolism: Secondary | ICD-10-CM | POA: Diagnosis not present

## 2021-03-04 DIAGNOSIS — Z8249 Family history of ischemic heart disease and other diseases of the circulatory system: Secondary | ICD-10-CM | POA: Diagnosis not present

## 2021-03-04 DIAGNOSIS — Z823 Family history of stroke: Secondary | ICD-10-CM | POA: Diagnosis not present

## 2021-03-04 DIAGNOSIS — I5022 Chronic systolic (congestive) heart failure: Secondary | ICD-10-CM | POA: Diagnosis not present

## 2021-03-04 DIAGNOSIS — I110 Hypertensive heart disease with heart failure: Secondary | ICD-10-CM | POA: Diagnosis not present

## 2021-03-04 DIAGNOSIS — Z79899 Other long term (current) drug therapy: Secondary | ICD-10-CM | POA: Diagnosis not present

## 2021-03-04 DIAGNOSIS — C300 Malignant neoplasm of nasal cavity: Secondary | ICD-10-CM | POA: Insufficient documentation

## 2021-03-04 DIAGNOSIS — I2510 Atherosclerotic heart disease of native coronary artery without angina pectoris: Secondary | ICD-10-CM | POA: Diagnosis not present

## 2021-03-04 DIAGNOSIS — Z885 Allergy status to narcotic agent status: Secondary | ICD-10-CM | POA: Diagnosis not present

## 2021-03-04 DIAGNOSIS — Z20822 Contact with and (suspected) exposure to covid-19: Secondary | ICD-10-CM | POA: Diagnosis not present

## 2021-03-04 DIAGNOSIS — C760 Malignant neoplasm of head, face and neck: Secondary | ICD-10-CM | POA: Diagnosis not present

## 2021-03-04 DIAGNOSIS — Z881 Allergy status to other antibiotic agents status: Secondary | ICD-10-CM | POA: Diagnosis not present

## 2021-03-04 DIAGNOSIS — F17210 Nicotine dependence, cigarettes, uncomplicated: Secondary | ICD-10-CM | POA: Diagnosis not present

## 2021-03-04 DIAGNOSIS — Z888 Allergy status to other drugs, medicaments and biological substances status: Secondary | ICD-10-CM | POA: Insufficient documentation

## 2021-03-04 HISTORY — DX: Anxiety disorder, unspecified: F41.9

## 2021-03-04 HISTORY — DX: Heart failure, unspecified: I50.9

## 2021-03-04 HISTORY — PX: MASS EXCISION: SHX2000

## 2021-03-04 HISTORY — PX: SKIN FULL THICKNESS GRAFT: SHX442

## 2021-03-04 HISTORY — DX: Personal history of urinary calculi: Z87.442

## 2021-03-04 LAB — BASIC METABOLIC PANEL
Anion gap: 8 (ref 5–15)
BUN: 11 mg/dL (ref 8–23)
CO2: 27 mmol/L (ref 22–32)
Calcium: 9 mg/dL (ref 8.9–10.3)
Chloride: 104 mmol/L (ref 98–111)
Creatinine, Ser: 1.12 mg/dL (ref 0.61–1.24)
GFR, Estimated: >60 mL/min (ref >60)
Glucose, Bld: 103 mg/dL — ABNORMAL HIGH (ref 70–99)
Potassium: 3.2 mmol/L — ABNORMAL LOW (ref 3.5–5.1)
Sodium: 139 mmol/L (ref 135–145)

## 2021-03-04 LAB — SARS CORONAVIRUS 2 BY RT PCR (HOSPITAL ORDER, PERFORMED IN ~~LOC~~ HOSPITAL LAB): SARS Coronavirus 2: NEGATIVE

## 2021-03-04 LAB — CBC
HCT: 40.1 % (ref 39.0–52.0)
Hemoglobin: 13.2 g/dL (ref 13.0–17.0)
MCH: 31.5 pg (ref 26.0–34.0)
MCHC: 32.9 g/dL (ref 30.0–36.0)
MCV: 95.7 fL (ref 80.0–100.0)
Platelets: 165 10*3/uL (ref 150–400)
RBC: 4.19 MIL/uL — ABNORMAL LOW (ref 4.22–5.81)
RDW: 14.6 % (ref 11.5–15.5)
WBC: 4.7 10*3/uL (ref 4.0–10.5)
nRBC: 0 % (ref 0.0–0.2)

## 2021-03-04 SURGERY — EXCISION MASS
Anesthesia: General | Site: Shoulder

## 2021-03-04 MED ORDER — 0.9 % SODIUM CHLORIDE (POUR BTL) OPTIME
TOPICAL | Status: DC | PRN
  Administered 2021-03-04: 1000 mL

## 2021-03-04 MED ORDER — MUPIROCIN 2 % EX OINT
TOPICAL_OINTMENT | CUTANEOUS | Status: AC
  Filled 2021-03-04: qty 22

## 2021-03-04 MED ORDER — ROCURONIUM BROMIDE 10 MG/ML (PF) SYRINGE
PREFILLED_SYRINGE | INTRAVENOUS | Status: DC | PRN
  Administered 2021-03-04: 70 mg via INTRAVENOUS

## 2021-03-04 MED ORDER — ROCURONIUM BROMIDE 10 MG/ML (PF) SYRINGE
PREFILLED_SYRINGE | INTRAVENOUS | Status: AC
  Filled 2021-03-04: qty 10

## 2021-03-04 MED ORDER — ORAL CARE MOUTH RINSE: 15.0000 mL | Freq: Once | OROMUCOSAL | Status: AC

## 2021-03-04 MED ORDER — LIDOCAINE-EPINEPHRINE 1 %-1:100000 IJ SOLN
INTRAMUSCULAR | Status: AC
  Filled 2021-03-04: qty 1

## 2021-03-04 MED ORDER — CEFAZOLIN SODIUM-DEXTROSE 2-4 GM/100ML-% IV SOLN
2.0000 g | INTRAVENOUS | Status: AC
  Administered 2021-03-04: 2 g via INTRAVENOUS
  Filled 2021-03-04: qty 100

## 2021-03-04 MED ORDER — HYDROCODONE-ACETAMINOPHEN 5-325 MG PO TABS: 1.0000 | ORAL_TABLET | Freq: Four times a day (QID) | ORAL | 0 refills | Status: DC | PRN

## 2021-03-04 MED ORDER — OXYCODONE HCL 5 MG PO TABS
ORAL_TABLET | ORAL | Status: AC
  Filled 2021-03-04: qty 1

## 2021-03-04 MED ORDER — PHENYLEPHRINE HCL-NACL 10-0.9 MG/250ML-% IV SOLN
INTRAVENOUS | Status: DC | PRN
  Administered 2021-03-04: 25 ug/min via INTRAVENOUS

## 2021-03-04 MED ORDER — FENTANYL CITRATE (PF) 100 MCG/2ML IJ SOLN: 25.0000 ug | INTRAMUSCULAR | Status: DC | PRN

## 2021-03-04 MED ORDER — CEPHALEXIN 500 MG PO CAPS: 500.0000 mg | ORAL_CAPSULE | Freq: Three times a day (TID) | ORAL | 0 refills | Status: DC

## 2021-03-04 MED ORDER — LIDOCAINE 2% (20 MG/ML) 5 ML SYRINGE
INTRAMUSCULAR | Status: DC | PRN
  Administered 2021-03-04: 60 mg via INTRAVENOUS

## 2021-03-04 MED ORDER — OXYCODONE HCL 5 MG PO TABS
5.0000 mg | ORAL_TABLET | Freq: Once | ORAL | Status: AC | PRN
  Administered 2021-03-04: 5 mg via ORAL

## 2021-03-04 MED ORDER — ACETAMINOPHEN 500 MG PO TABS
1000.0000 mg | ORAL_TABLET | Freq: Once | ORAL | Status: AC
  Administered 2021-03-04: 1000 mg via ORAL
  Filled 2021-03-04: qty 2

## 2021-03-04 MED ORDER — DEXAMETHASONE SODIUM PHOSPHATE 10 MG/ML IJ SOLN
INTRAMUSCULAR | Status: DC | PRN
  Administered 2021-03-04: 5 mg via INTRAVENOUS

## 2021-03-04 MED ORDER — OXYMETAZOLINE HCL 0.05 % NA SOLN
NASAL | Status: DC | PRN
  Administered 2021-03-04: 1

## 2021-03-04 MED ORDER — SUGAMMADEX SODIUM 200 MG/2ML IV SOLN
INTRAVENOUS | Status: DC | PRN
  Administered 2021-03-04 (×4): 50 mg via INTRAVENOUS

## 2021-03-04 MED ORDER — FENTANYL CITRATE (PF) 250 MCG/5ML IJ SOLN
INTRAMUSCULAR | Status: DC | PRN
  Administered 2021-03-04 (×2): 50 ug via INTRAVENOUS
  Administered 2021-03-04: 100 ug via INTRAVENOUS

## 2021-03-04 MED ORDER — ONDANSETRON HCL 4 MG/2ML IJ SOLN
INTRAMUSCULAR | Status: AC
  Filled 2021-03-04: qty 2

## 2021-03-04 MED ORDER — PROPOFOL 10 MG/ML IV BOLUS
INTRAVENOUS | Status: DC | PRN
  Administered 2021-03-04: 130 mg via INTRAVENOUS

## 2021-03-04 MED ORDER — CHLORHEXIDINE GLUCONATE 0.12 % MT SOLN
15.0000 mL | Freq: Once | OROMUCOSAL | Status: AC
  Administered 2021-03-04: 15 mL via OROMUCOSAL
  Filled 2021-03-04: qty 15

## 2021-03-04 MED ORDER — ACETAMINOPHEN 10 MG/ML IV SOLN: 1000.0000 mg | Freq: Once | INTRAVENOUS | Status: DC | PRN

## 2021-03-04 MED ORDER — FENTANYL CITRATE (PF) 250 MCG/5ML IJ SOLN
INTRAMUSCULAR | Status: AC
  Filled 2021-03-04: qty 5

## 2021-03-04 MED ORDER — OXYMETAZOLINE HCL 0.05 % NA SOLN
NASAL | Status: AC
  Filled 2021-03-04: qty 30

## 2021-03-04 MED ORDER — LACTATED RINGERS IV SOLN: INTRAVENOUS | Status: DC

## 2021-03-04 MED ORDER — CHLORHEXIDINE GLUCONATE CLOTH 2 % EX PADS: 6.0000 | MEDICATED_PAD | Freq: Once | CUTANEOUS | Status: DC

## 2021-03-04 MED ORDER — LIDOCAINE-EPINEPHRINE 1 %-1:100000 IJ SOLN
INTRAMUSCULAR | Status: DC | PRN
  Administered 2021-03-04: 18 mL

## 2021-03-04 MED ORDER — MUPIROCIN 2 % EX OINT
TOPICAL_OINTMENT | CUTANEOUS | Status: DC | PRN
  Administered 2021-03-04: 1 via NASAL

## 2021-03-04 MED ORDER — DEXAMETHASONE SODIUM PHOSPHATE 10 MG/ML IJ SOLN
INTRAMUSCULAR | Status: AC
  Filled 2021-03-04: qty 1

## 2021-03-04 MED ORDER — ONDANSETRON HCL 4 MG/2ML IJ SOLN
INTRAMUSCULAR | Status: DC | PRN
  Administered 2021-03-04: 4 mg via INTRAVENOUS

## 2021-03-04 MED ORDER — ACETAMINOPHEN 500 MG PO TABS: 1000.0000 mg | ORAL_TABLET | Freq: Once | ORAL | Status: DC | PRN

## 2021-03-04 MED ORDER — ACETAMINOPHEN 160 MG/5ML PO SOLN: 1000.0000 mg | Freq: Once | ORAL | Status: DC | PRN

## 2021-03-04 MED ORDER — LACTATED RINGERS IV SOLN: INTRAVENOUS | Status: DC | PRN

## 2021-03-04 MED ORDER — OXYCODONE HCL 5 MG/5ML PO SOLN: 5.0000 mg | Freq: Once | ORAL | Status: AC | PRN

## 2021-03-04 SURGICAL SUPPLY — 65 items
APPLICATOR COTTON TIP 6 STRL (MISCELLANEOUS) ×4 IMPLANT
APPLICATOR COTTON TIP 6IN STRL (MISCELLANEOUS) ×6
ATTRACTOMAT 16X20 MAGNETIC DRP (DRAPES) IMPLANT
BLADE EAR TYMPAN 2.5 60D BEAV (BLADE) ×3 IMPLANT
BLADE SURG 15 STRL LF DISP TIS (BLADE) IMPLANT
BLADE SURG 15 STRL SS (BLADE)
BNDG CONFORM 2 STRL LF (GAUZE/BANDAGES/DRESSINGS) IMPLANT
BNDG GAUZE ELAST 4 BULKY (GAUZE/BANDAGES/DRESSINGS) IMPLANT
CANISTER SUCT 3000ML PPV (MISCELLANEOUS) IMPLANT
CATH ROBINSON RED A/P 16FR (CATHETERS) IMPLANT
CLEANER TIP ELECTROSURG 2X2 (MISCELLANEOUS) ×3 IMPLANT
CNTNR URN SCR LID CUP LEK RST (MISCELLANEOUS) ×2 IMPLANT
CONT SPEC 4OZ STRL OR WHT (MISCELLANEOUS) ×1
CORD BIPOLAR FORCEPS 12FT (ELECTRODE) ×3 IMPLANT
COVER SURGICAL LIGHT HANDLE (MISCELLANEOUS) ×3 IMPLANT
COVER WAND RF STERILE (DRAPES) ×3 IMPLANT
DERMABOND ADVANCED (GAUZE/BANDAGES/DRESSINGS)
DERMABOND ADVANCED .7 DNX12 (GAUZE/BANDAGES/DRESSINGS) IMPLANT
DRAIN PENROSE 1/4X12 LTX STRL (WOUND CARE) IMPLANT
DRAIN SNY 10 ROU (WOUND CARE) IMPLANT
DRAIN SNY 7 FPER (WOUND CARE) IMPLANT
DRAPE HALF SHEET 40X57 (DRAPES) IMPLANT
DRESSING NASAL POPE 10X1.5X2.5 (GAUZE/BANDAGES/DRESSINGS) ×2 IMPLANT
DRSG EMULSION OIL 3X3 NADH (GAUZE/BANDAGES/DRESSINGS) IMPLANT
DRSG NASAL POPE 10X1.5X2.5 (GAUZE/BANDAGES/DRESSINGS) ×3
ELECT COATED BLADE 2.86 ST (ELECTRODE) ×3 IMPLANT
ELECT NEEDLE TIP 2.8 STRL (NEEDLE) IMPLANT
ELECT REM PT RETURN 9FT ADLT (ELECTROSURGICAL) ×3
ELECTRODE REM PT RTRN 9FT ADLT (ELECTROSURGICAL) ×2 IMPLANT
FORCEPS BIPOLAR SPETZLER 8 1.0 (NEUROSURGERY SUPPLIES) ×3 IMPLANT
GAUZE 4X4 16PLY RFD (DISPOSABLE) ×3 IMPLANT
GAUZE SPONGE 4X4 12PLY STRL (GAUZE/BANDAGES/DRESSINGS) ×3 IMPLANT
GAUZE XEROFORM 1X8 LF (GAUZE/BANDAGES/DRESSINGS) ×3 IMPLANT
GLOVE ECLIPSE 7.5 STRL STRAW (GLOVE) ×3 IMPLANT
GOWN STRL REUS W/ TWL LRG LVL3 (GOWN DISPOSABLE) ×4 IMPLANT
GOWN STRL REUS W/ TWL XL LVL3 (GOWN DISPOSABLE) ×2 IMPLANT
GOWN STRL REUS W/TWL LRG LVL3 (GOWN DISPOSABLE) ×2
GOWN STRL REUS W/TWL XL LVL3 (GOWN DISPOSABLE) ×1
KIT BASIN OR (CUSTOM PROCEDURE TRAY) ×3 IMPLANT
KIT TURNOVER KIT B (KITS) ×3 IMPLANT
NEEDLE 25GX 5/8IN NON SAFETY (NEEDLE) ×3 IMPLANT
NS IRRIG 1000ML POUR BTL (IV SOLUTION) ×3 IMPLANT
PAD ARMBOARD 7.5X6 YLW CONV (MISCELLANEOUS) ×6 IMPLANT
PATTIES SURGICAL .5X1.5 (GAUZE/BANDAGES/DRESSINGS) ×3 IMPLANT
PENCIL FOOT CONTROL (ELECTRODE) ×3 IMPLANT
POSITIONER HEAD DONUT 9IN (MISCELLANEOUS) IMPLANT
SUT CHROMIC 3 0 PS 2 (SUTURE) ×6 IMPLANT
SUT CHROMIC 3 0 SH 27 (SUTURE) ×3 IMPLANT
SUT CHROMIC 4 0 P 3 18 (SUTURE) IMPLANT
SUT ETHILON 4 0 PS 2 18 (SUTURE) ×3 IMPLANT
SUT ETHILON 5 0 P 3 18 (SUTURE) ×1
SUT NYLON ETHILON 5-0 P-3 1X18 (SUTURE) ×2 IMPLANT
SUT SILK 4 0 (SUTURE)
SUT SILK 4-0 18XBRD TIE 12 (SUTURE) IMPLANT
SUT VIC AB 4-0 PS2 18 (SUTURE) ×9 IMPLANT
SUT VIC AB 5-0 P-3 18X BRD (SUTURE) ×8 IMPLANT
SUT VIC AB 5-0 P3 18 (SUTURE) ×4
SWAB COLLECTION DEVICE MRSA (MISCELLANEOUS) IMPLANT
SWAB CULTURE ESWAB REG 1ML (MISCELLANEOUS) IMPLANT
SYR BULB IRRIG 60ML STRL (SYRINGE) IMPLANT
SYR TB 1ML LUER SLIP (SYRINGE) IMPLANT
TAPE CLOTH SOFT 2X10 (GAUZE/BANDAGES/DRESSINGS) ×3 IMPLANT
TOWEL GREEN STERILE FF (TOWEL DISPOSABLE) ×3 IMPLANT
TRAY ENT MC OR (CUSTOM PROCEDURE TRAY) ×3 IMPLANT
WATER STERILE IRR 1000ML POUR (IV SOLUTION) ×3 IMPLANT

## 2021-03-04 NOTE — Progress Notes (Signed)
[employer]. Jude rep will be at bedside in 5 minutes.

## 2021-03-04 NOTE — Op Note (Signed)
NAME: Michael Delacruz, Michael Delacruz MEDICAL RECORD NO: [id number] ACCOUNT NO: [id number] DATE OF BIRTH: 03/11/1941 FACILITY: [facility] LOCATION: [facility] PHYSICIAN: Leonides Sake. Lucia Gaskins, MD  Operative Report   DATE OF PROCEDURE: 03/04/2021  PREOPERATIVE DIAGNOSIS:  Left intranasal squamous cell carcinoma measuring approximately 1.5 x 2.5 cm in size.  POSTOPERATIVE DIAGNOSES:  Left intranasal squamous cell carcinoma measuring approximately 1.5 x 2.5 cm in size.  OPERATION PROCEDURE:  Wide excision of left intranasal squamous cell carcinoma with placement of a full-thickness skin graft.  SURGEON:  Leonides Sake. Lucia Gaskins, MD  ANESTHESIA: General endotracheal.  BLOOD LOSS:  40 mL.  COMPLICATIONS:  None.  BRIEF CLINICAL NOTE:  The patient is a 80 year old gentleman who has had a chronic sore in the left nostril for several months now.  A biopsy was obtained from the sore in the office that revealed squamous cell carcinoma.  On exam, the cancer extends to  the skin of the nostril, a little bit more on the medial aspect at the base of the columella on the left side, extends superiorly up along the columella up onto the septum more posteriorly.  He has a shallow ulceration of this lesion.  He gives a  previous history of basal cell carcinomas removed from his nose.  He is taken to the operating room this time for wide excision of this with placement of a full-thickness skin graft.  DESCRIPTION OF PROCEDURE:  The patient underwent general endotracheal anesthesia.  Nose was then further prepped with Betadine solution, and the left neck supraclavicular area was prepped with Betadine solution for planned full-thickness skin graft.  The  area of the cancer was marked out and this was excised with 2-3 mm margins.  Excision involved excising underlying membranes as well as the mucoperichondrium along the anterior cartilaginous septum on the left side.  Specimen was removed and was marked  with 2 long sutures at the 12  o'clock or anterior inferior margin, 2 short sutures at the posterior or 6 o'clock margin, and a long and short suture marking the 3 o'clock or superior columella margin.  Hemostasis was obtained with bipolar cautery.  After  making some measurements, a full-thickness skin graft was harvested from the left supraclavicular area.  The donor site was closed with 3-0 chromic suture subcutaneously and 5-0 nylon to reapproximate skin edges.  The full-thickness skin graft was then  cut to appropriate size and was secured to the approximate 2-3 cm defect using 5-0 Vicryl sutures at the periphery and then the graft was quilted to the septum with 3-0 chromic sutures.  Xeroform was placed over the graft followed by a Merocel pack and  mupirocin ointment.  This especially obstructed the left nostril.  In order for him to breathe better, a #6 nasal trumpet was placed in the left nasal cavity.  This completed the procedure.  The patient was awoken from anesthesia and transferred to  recovery room postop doing well.  DISPOSITION:  He will be discharged home later this evening on Keflex 500 mg t.i.d. for 1 week and Tylenol, ibuprofen, and hydrocodone 5 mg tablets 1 every 6 hours p.r.n. pain.  He will follow up in my office in 5 days to have the Xeroform packing  removed from the left side of the nose for the full-thickness skin graft.   ROH D: 03/04/2021 6:04:44 pm T: 03/04/2021 7:59:00 pm  JOB: [id number]/ [id number]

## 2021-03-04 NOTE — Anesthesia Procedure Notes (Signed)
Procedure Name: Intubation Date/Time: 03/04/2021 3:32 PM Performed by: Imagene Riches, CRNA Pre-anesthesia Checklist: Patient identified, Emergency Drugs available, Suction available and Patient being monitored Patient Re-evaluated:Patient Re-evaluated prior to induction Oxygen Delivery Method: Circle System Utilized Preoxygenation: Pre-oxygenation with 100% oxygen Induction Type: IV induction Ventilation: Mask ventilation without difficulty and Oral airway inserted - appropriate to patient size Laryngoscope Size: Sabra Heck and 2 Grade View: Grade I Tube type: Oral Tube size: 7.5 mm Number of attempts: 1 Airway Equipment and Method: Stylet and Oral airway Placement Confirmation: ETT inserted through vocal cords under direct vision,  positive ETCO2 and breath sounds checked- equal and bilateral Secured at: 23 cm Tube secured with: Tape Dental Injury: Teeth and Oropharynx as per pre-operative assessment

## 2021-03-04 NOTE — Transfer of Care (Signed)
Immediate Anesthesia Transfer of Care Note  Patient: Michael Delacruz  Procedure(s) Performed: EXCISION INNER NASAL CANCER (N/A Abdomen) SKIN GRAFT FULL THICKNESS (N/A Shoulder)  Patient Location: PACU  Anesthesia Type:General  Level of Consciousness: drowsy and patient cooperative  Airway & Oxygen Therapy: Patient Spontanous Breathing and Patient connected to face mask oxygen  Post-op Assessment: Report given to RN and Post -op Vital signs reviewed and stable  Post vital signs: Reviewed and stable  Last Vitals:  Vitals Value Taken Time  BP 129/81 03/04/21 1803  Temp    Pulse 80 03/04/21 1808  Resp 18 03/04/21 1808  SpO2 99 % 03/04/21 1808  Vitals shown include unvalidated device data.  Last Pain:  Vitals:   03/04/21 1304  TempSrc:   PainSc: 0-No pain         Complications: No complications documented.

## 2021-03-04 NOTE — Anesthesia Postprocedure Evaluation (Signed)
Anesthesia Post Note  Patient: Michael Delacruz  Procedure(s) Performed: EXCISION INNER NASAL CANCER (N/A Abdomen) SKIN GRAFT FULL THICKNESS (N/A Shoulder)     Patient location during evaluation: PACU Anesthesia Type: General Level of consciousness: sedated Pain management: pain level controlled Vital Signs Assessment: post-procedure vital signs reviewed and stable Respiratory status: spontaneous breathing and respiratory function stable Cardiovascular status: stable Postop Assessment: no apparent nausea or vomiting Anesthetic complications: no   No complications documented.  Last Vitals:  Vitals:   03/04/21 1850 03/04/21 1905  BP: 126/87 (!) 128/57  Pulse: 80 77  Resp: 18 14  Temp:  (!) 36.2 C  SpO2: 95% 99%    Last Pain:  Vitals:   03/04/21 1905  TempSrc:   PainSc: Asleep                 Milady Fleener DANIEL

## 2021-03-04 NOTE — H&P (Signed)
PREOPERATIVE H&P  Chief Complaint: Chronic sore on the left nostril.  HPI: Michael Delacruz is a 80 y.o. male who presents for evaluation of chronic sore he has had in his left nostril for several months.  On exam in the office patient has an ulcer involving the left nostril that extends up onto the left side of the septum.  Biopsy of the ulcer in the office revealed squamous cell carcinoma.  He is taken to the operating room at this time for wide excision of the squamous cell carcinoma with a full-thickness skin graft placement. He has history of cardiac disease and congestive heart failure.  Past Medical History:  Diagnosis Date  . Anxiety   . Aortic dilatation ([place])    a. 11/2018 Echo: Ao root 24mm, Asc Ao 73mm.  . Arthritis   . Asthma   . Bradycardia   . CAD in native artery    a. LHC 09/2015: 40% pCx, 35% mRCA.  [name] CHF (congestive heart failure) ([place])   . COPD (chronic obstructive pulmonary disease) ([place])   . Dilation of intestine 01/2015  . Fibromyalgia   . Frequent headaches   . GERD (gastroesophageal reflux disease)   . History of blood clots    eye   . History of hiatal hernia   . History of kidney stones   . History of rheumatic fever   . Hypertension   . NICM (nonischemic cardiomyopathy) ([place])    a. 07/2017 Echo: EF reduced to 40-45%; b. 07/2018 Echo: EF 25-30%; c. 11/2018 Echo: EF 30-35%, diff HK w/ sev inf HK, Ao root 76mm, Asc Ao 58mm, Mild MS (grad 91mmHg), mildly dil LA.  [name] PAF (paroxysmal atrial fibrillation) ([place])    a. s/p TEE/DCCV 07/2015. b. H/o bleeding on Coumadin when INR >5, changed to Eliquis-subsequently discontinued; c. 04/2018 s/p SJM 3562 Quadra Allure MP DC PPM & AVN ablation.  . S/P Minimally invasive maze operation for atrial fibrillation 10/22/2015   Complete bilateral atrial lesion set using cryothermy and bipolar radiofrequency ablation with clipping of LA appendage via right mini thoracotomy approach  . S/P minimally invasive mitral valve repair  10/22/2015   Complex valvuloplasty including triangular resection of posterior leaflet, artificial Gore-tex neochord placement x6 and 38 mm Sorin Memo 3D Rechord ring annuloplasty via right minithoracotomy approach  . Severe mitral regurgitation s/p MVR    a. s/p MV repair 10/2015; b. 11/2018 Echo: Mild MS (mean grad 45mmHg).  . Sleep apnea    no longer uses cpap and doesn't use O2 at home  . Stroke [facility])     no vision in right, on occasion sees a pinpoint   . Thyroid disorder   . TIA (transient ischemic attack)   . Tobacco abuse    Past Surgical History:  Procedure Laterality Date  . ANKLE SURGERY    . AV NODE ABLATION N/A 05/04/2018   Procedure: AV NODE ABLATION;  Surgeon: Thompson Grayer, MD;  Location: [place] [facility];  Service: Cardiovascular;  Laterality: N/A;  . BIV PACEMAKER INSERTION CRT-P N/A 05/03/2018   Procedure: BIV PACEMAKER INSERTION CRT-P;  Surgeon: Deboraha Sprang, MD;  Location: [place] [facility];  Service: Cardiovascular;  Laterality: N/A;  . CARDIAC CATHETERIZATION N/A 10/03/2015   Procedure: Right and Left Heart Cath and Coronary Angiography;  Surgeon: [name], MD;  Location: [place] [facility];  Service: Cardiovascular;  Laterality: N/A;  . COLONOSCOPY    . ELECTROPHYSIOLOGIC STUDY N/A 08/18/2015   Procedure: CARDIOVERSION;  Surgeon: Wellington Hampshire, MD;  Location: [facility];  Service: Cardiovascular;  Laterality: N/A;  . MINIMALLY INVASIVE MAZE PROCEDURE N/A 10/22/2015   Procedure: MINIMALLY INVASIVE MAZE PROCEDURE;  Surgeon: Rexene Alberts, MD;  Location: [place];  Service: Open Heart Surgery;  Laterality: N/A;  . MITRAL VALVE REPAIR Right 10/22/2015   Procedure: MINIMALLY INVASIVE MITRAL VALVE REPAIR (MVR);  Surgeon: Rexene Alberts, MD;  Location: [place];  Service: Open Heart Surgery;  Laterality: Right;  . SINUS EXPLORATION    . TEE WITHOUT CARDIOVERSION N/A 08/18/2015   Procedure: TRANSESOPHAGEAL ECHOCARDIOGRAM (TEE);  Surgeon: Wellington Hampshire, MD;   Location: [facility];  Service: Cardiovascular;  Laterality: N/A;  . TEE WITHOUT CARDIOVERSION N/A 10/22/2015   Procedure: TRANSESOPHAGEAL ECHOCARDIOGRAM (TEE);  Surgeon: Rexene Alberts, MD;  Location: [place];  Service: Open Heart Surgery;  Laterality: N/A;   Social History   Socioeconomic History  . Marital status: Married    Spouse name: [name]  . Number of children: 3  . Years of education: GED  . Highest education level: GED or equivalent  Occupational History  . Not on file  Tobacco Use  . Smoking status: Current Every Day Smoker    Packs/day: 0.25    Years: 52.00    Pack years: 13.00    Types: Cigarettes    Start date: 75  . Smokeless tobacco: Never Used  . Tobacco comment: Resumed smoking, after quit 01/2018  Vaping Use  . Vaping Use: Never used  Substance and Sexual Activity  . Alcohol use: Not Currently    Alcohol/week: 1.0 standard drink    Types: 1 Standard drinks or equivalent per week    Comment: occasionally drinks a margarita  . Drug use: No  . Sexual activity: Not on file  Other Topics Concern  . Not on file  Social History Narrative   Right handed    2-3 cups coffee per day         Social Determinants of Health   Financial Resource Strain: High Risk  . Difficulty of Paying Living Expenses: Hard  Food Insecurity: Food Insecurity Present  . Worried About Charity fundraiser in the Last Year: Often true  . Ran Out of Food in the Last Year: Often true  Transportation Needs: No Transportation Needs  . Lack of Transportation (Medical): No  . Lack of Transportation (Non-Medical): No  Physical Activity: Inactive  . Days of Exercise per Week: 0 days  . Minutes of Exercise per Session: 0 min  Stress: No Stress Concern Present  . Feeling of Stress : Not at all  Social Connections: Moderately Isolated  . Frequency of Communication with Friends and Family: More than three times a week  . Frequency of Social Gatherings with Friends and Family: More than  three times a week  . Attends Religious Services: Never  . Active Member of Clubs or Organizations: No  . Attends Archivist Meetings: Never  . Marital Status: Married   Family History  Problem Relation Age of Onset  . Stroke Mother   . Irregular heart beat Mother   . Heart murmur Brother   . Pulmonary embolism Brother   . Hypertension Other   . Pulmonary embolism Maternal Uncle    Allergies  Allergen Reactions  . Codeine Nausea Only  . Digoxin And Related     SOB, bad dreams  . Macrodantin [Nitrofurantoin Macrocrystal] Rash  . Morphine And Related Rash   Prior to Admission medications  Medication Sig Start Date End Date Taking? Authorizing Provider  Acetaminophen (TYLENOL 8 HOUR PO) Take by mouth every 8 (eight) hours. As needed    [provider]  busPIRone (BUSPAR) 5 MG tablet Take 1 tablet (5 mg total) by mouth 3 (three) times daily as needed (anxiety). 02/12/21   Karamalegos, Devonne Doughty, DO  furosemide (LASIX) 40 MG tablet Take 1 tablet (40 mg total) by mouth daily as needed (As needed for weight gain or shortness of breath). 02/09/21   Theora Gianotti, NP  Homeopathic Products (HOMEOPATHIC CALM SL) Place under the tongue. Is using Doterra essential oil of Onguard ingestion daily    [provider]  ipratropium (ATROVENT) 0.02 % nebulizer solution Take 2.5 mLs (0.5 mg total) by nebulization 4 (four) times daily. 02/20/21   Rozetta Nunnery, MD  ipratropium (ATROVENT) 0.06 % nasal spray Place 2 sprays into both nostrils 4 (four) times daily. As needed 02/06/21   [name], DO  mupirocin ointment (BACTROBAN) 2 % Apply 1 application topically 3 (three) times daily. 02/18/21   [provider]  QUEtiapine (SEROQUEL) 25 MG tablet Take 1 tablet (25 mg total) by mouth at bedtime. 02/06/21   Karamalegos, Devonne Doughty, DO     Positive ROS: Otherwise negative  All other systems have been reviewed and were otherwise negative  with the exception of those mentioned in the HPI and as above.  Physical Exam: There were no vitals filed for this visit.  General: Alert, no acute distress Oral: Normal oral mucosa and tonsils Nasal: Patient has a superficial ulcer that extends along the lower left nostril that extends back onto the septum.  He has some scabbing and crusting.  But this appears fairly superficial.  Remaining nasal cavity is clear. Neck: No palpable adenopathy or thyroid nodules Ear: Ear canal is clear with normal appearing TMs Cardiovascular: Regular rate and rhythm, no murmur.  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  Plan for wide excision of the left nostril in septal squamous cell carcinoma with placement of full-thickness skin graft.   Melony Overly, MD 03/04/2021 1:08 PM

## 2021-03-04 NOTE — Brief Op Note (Signed)
03/04/2021  5:55 PM  PATIENT:  Michael Delacruz  80 y.o. male  PRE-OPERATIVE DIAGNOSIS:  INNER NASAL CANCER  POST-OPERATIVE DIAGNOSIS:  INNER NASAL CANCER  PROCEDURE:  Procedure(s): EXCISION INNER NASAL CANCER (N/A) SKIN GRAFT FULL THICKNESS (N/A) 1.5 x 2 cmm left intranasal SCCa  SURGEON:  Surgeon(s) and Role:    Rozetta Nunnery, MD - Primary  PHYSICIAN ASSISTANT:   ASSISTANTS: none   ANESTHESIA:   general  EBL:  20 mL   BLOOD ADMINISTERED:none  DRAINS: none   LOCAL MEDICATIONS USED:  XYLOCAINE   SPECIMEN:  Source of Specimen:  left intranasal scca  DISPOSITION OF SPECIMEN:  PATHOLOGY  COUNTS:  YES  TOURNIQUET:  * No tourniquets in log *  DICTATION: .Other Dictation: Dictation Number [id number]  PLAN OF CARE: Discharge to home after PACU  PATIENT DISPOSITION:  PACU - hemodynamically stable.   Delay start of Pharmacological VTE agent (>24hrs) due to surgical blood loss or risk of bleeding: yes

## 2021-03-04 NOTE — Progress Notes (Signed)
Spoke to Dr. Tobias Alexander regarding PPM instructions.  Called [employer] and paged rep.  Awaiting return call.

## 2021-03-04 NOTE — Discharge Instructions (Signed)
Take tylenol, ibuprofen or hydrocodone 5 mg tabs every 6 hrs prn pain Keflex 500 mg tid for the next week Can breath through the nasal trumpet. If it is uncomfortable you can remove it but don't try to replace it if you remove it Return to see Dr Lucia Gaskins next Monday at 4:30. Call office if you have any problems or questions.      [phone]

## 2021-03-05 ENCOUNTER — Other Ambulatory Visit (INDEPENDENT_AMBULATORY_CARE_PROVIDER_SITE_OTHER): Payer: Self-pay | Admitting: Otolaryngology

## 2021-03-05 ENCOUNTER — Encounter (HOSPITAL_COMMUNITY): Payer: Self-pay | Admitting: Otolaryngology

## 2021-03-05 MED ORDER — HYDROCODONE-ACETAMINOPHEN 7.5-325 MG/15ML PO SOLN: 10.0000 mL | Freq: Four times a day (QID) | ORAL | 0 refills | Status: DC | PRN

## 2021-03-07 DIAGNOSIS — U099 Post covid-19 condition, unspecified: Secondary | ICD-10-CM | POA: Diagnosis not present

## 2021-03-07 DIAGNOSIS — G933 Postviral fatigue syndrome: Secondary | ICD-10-CM | POA: Diagnosis not present

## 2021-03-07 DIAGNOSIS — M6250 Muscle wasting and atrophy, not elsewhere classified, unspecified site: Secondary | ICD-10-CM | POA: Diagnosis not present

## 2021-03-07 DIAGNOSIS — I110 Hypertensive heart disease with heart failure: Secondary | ICD-10-CM | POA: Diagnosis not present

## 2021-03-07 DIAGNOSIS — J432 Centrilobular emphysema: Secondary | ICD-10-CM | POA: Diagnosis not present

## 2021-03-07 DIAGNOSIS — G4700 Insomnia, unspecified: Secondary | ICD-10-CM | POA: Diagnosis not present

## 2021-03-07 DIAGNOSIS — F411 Generalized anxiety disorder: Secondary | ICD-10-CM | POA: Diagnosis not present

## 2021-03-07 DIAGNOSIS — G4733 Obstructive sleep apnea (adult) (pediatric): Secondary | ICD-10-CM | POA: Diagnosis not present

## 2021-03-07 DIAGNOSIS — F32A Depression, unspecified: Secondary | ICD-10-CM | POA: Diagnosis not present

## 2021-03-09 ENCOUNTER — Ambulatory Visit (INDEPENDENT_AMBULATORY_CARE_PROVIDER_SITE_OTHER): Payer: Medicare HMO

## 2021-03-09 ENCOUNTER — Other Ambulatory Visit: Payer: Self-pay

## 2021-03-09 ENCOUNTER — Ambulatory Visit (INDEPENDENT_AMBULATORY_CARE_PROVIDER_SITE_OTHER): Payer: Medicare HMO | Admitting: Otolaryngology

## 2021-03-09 DIAGNOSIS — I5020 Unspecified systolic (congestive) heart failure: Secondary | ICD-10-CM

## 2021-03-09 DIAGNOSIS — Z950 Presence of cardiac pacemaker: Secondary | ICD-10-CM

## 2021-03-09 DIAGNOSIS — Z4889 Encounter for other specified surgical aftercare: Secondary | ICD-10-CM

## 2021-03-09 NOTE — Progress Notes (Signed)
HPI: Michael Delacruz is a 80 y.o. male who presents 5 days s/p wide excision of left intranasal squamous cell carcinoma with placement of full-thickness skin graft..   Past Medical History:  Diagnosis Date  . Anxiety   . Aortic dilatation ([place])    a. 11/2018 Echo: Ao root 103mm, Asc Ao 58mm.  . Arthritis   . Asthma   . Bradycardia   . CAD in native artery    a. LHC 09/2015: 40% pCx, 35% mRCA.  [name] CHF (congestive heart failure) ([place])   . COPD (chronic obstructive pulmonary disease) ([place])   . Dilation of intestine 01/2015  . Fibromyalgia   . Frequent headaches   . GERD (gastroesophageal reflux disease)   . History of blood clots    eye   . History of hiatal hernia   . History of kidney stones   . History of rheumatic fever   . Hypertension   . NICM (nonischemic cardiomyopathy) ([place])    a. 07/2017 Echo: EF reduced to 40-45%; b. 07/2018 Echo: EF 25-30%; c. 11/2018 Echo: EF 30-35%, diff HK w/ sev inf HK, Ao root 15mm, Asc Ao 66mm, Mild MS (grad 71mmHg), mildly dil LA.  [name] PAF (paroxysmal atrial fibrillation) ([place])    a. s/p TEE/DCCV 07/2015. b. H/o bleeding on Coumadin when INR >5, changed to Eliquis-subsequently discontinued; c. 04/2018 s/p SJM 3562 Quadra Allure MP DC PPM & AVN ablation.  . S/P Minimally invasive maze operation for atrial fibrillation 10/22/2015   Complete bilateral atrial lesion set using cryothermy and bipolar radiofrequency ablation with clipping of LA appendage via right mini thoracotomy approach  . S/P minimally invasive mitral valve repair 10/22/2015   Complex valvuloplasty including triangular resection of posterior leaflet, artificial Gore-tex neochord placement x6 and 38 mm Sorin Memo 3D Rechord ring annuloplasty via right minithoracotomy approach  . Severe mitral regurgitation s/p MVR    a. s/p MV repair 10/2015; b. 11/2018 Echo: Mild MS (mean grad 78mmHg).  . Sleep apnea    no longer uses cpap and doesn't use O2 at home  . Stroke [facility])     no vision in right, on  occasion sees a pinpoint   . Thyroid disorder   . TIA (transient ischemic attack)   . Tobacco abuse    Past Surgical History:  Procedure Laterality Date  . ANKLE SURGERY    . AV NODE ABLATION N/A 05/04/2018   Procedure: AV NODE ABLATION;  Surgeon: Thompson Grayer, MD;  Location: [place] [facility];  Service: Cardiovascular;  Laterality: N/A;  . BIV PACEMAKER INSERTION CRT-P N/A 05/03/2018   Procedure: BIV PACEMAKER INSERTION CRT-P;  Surgeon: Deboraha Sprang, MD;  Location: [place] [facility];  Service: Cardiovascular;  Laterality: N/A;  . CARDIAC CATHETERIZATION N/A 10/03/2015   Procedure: Right and Left Heart Cath and Coronary Angiography;  Surgeon: [name], MD;  Location: [place] [facility];  Service: Cardiovascular;  Laterality: N/A;  . COLONOSCOPY    . ELECTROPHYSIOLOGIC STUDY N/A 08/18/2015   Procedure: CARDIOVERSION;  Surgeon: Wellington Hampshire, MD;  Location: [facility];  Service: Cardiovascular;  Laterality: N/A;  . MASS EXCISION N/A 03/04/2021   Procedure: EXCISION INNER NASAL CANCER;  Surgeon: Rozetta Nunnery, MD;  Location: [facility];  Service: ENT;  Laterality: N/A;  . MINIMALLY INVASIVE MAZE PROCEDURE N/A 10/22/2015   Procedure: MINIMALLY INVASIVE MAZE PROCEDURE;  Surgeon: Rexene Alberts, MD;  Location: [place];  Service: Open Heart Surgery;  Laterality: N/A;  . MITRAL  VALVE REPAIR Right 10/22/2015   Procedure: MINIMALLY INVASIVE MITRAL VALVE REPAIR (MVR);  Surgeon: Rexene Alberts, MD;  Location: [place];  Service: Open Heart Surgery;  Laterality: Right;  . SINUS EXPLORATION    . SKIN FULL THICKNESS GRAFT N/A 03/04/2021   Procedure: SKIN GRAFT FULL THICKNESS;  Surgeon: Rozetta Nunnery, MD;  Location: [place];  Service: ENT;  Laterality: N/A;  . TEE WITHOUT CARDIOVERSION N/A 08/18/2015   Procedure: TRANSESOPHAGEAL ECHOCARDIOGRAM (TEE);  Surgeon: Wellington Hampshire, MD;  Location: [facility];  Service: Cardiovascular;  Laterality: N/A;  . TEE WITHOUT CARDIOVERSION N/A 10/22/2015    Procedure: TRANSESOPHAGEAL ECHOCARDIOGRAM (TEE);  Surgeon: Rexene Alberts, MD;  Location: [place];  Service: Open Heart Surgery;  Laterality: N/A;   Social History   Socioeconomic History  . Marital status: Married    Spouse name: [name]  . Number of children: 3  . Years of education: GED  . Highest education level: GED or equivalent  Occupational History  . Not on file  Tobacco Use  . Smoking status: Current Every Day Smoker    Packs/day: 0.25    Years: 52.00    Pack years: 13.00    Types: Cigarettes    Start date: 2  . Smokeless tobacco: Never Used  . Tobacco comment: Resumed smoking, after quit 01/2018  Vaping Use  . Vaping Use: Never used  Substance and Sexual Activity  . Alcohol use: Not Currently    Alcohol/week: 1.0 standard drink    Types: 1 Standard drinks or equivalent per week    Comment: occasionally drinks a margarita  . Drug use: No  . Sexual activity: Not on file  Other Topics Concern  . Not on file  Social History Narrative   Right handed    2-3 cups coffee per day         Social Determinants of Health   Financial Resource Strain: High Risk  . Difficulty of Paying Living Expenses: Hard  Food Insecurity: Food Insecurity Present  . Worried About Charity fundraiser in the Last Year: Often true  . Ran Out of Food in the Last Year: Often true  Transportation Needs: No Transportation Needs  . Lack of Transportation (Medical): No  . Lack of Transportation (Non-Medical): No  Physical Activity: Inactive  . Days of Exercise per Week: 0 days  . Minutes of Exercise per Session: 0 min  Stress: No Stress Concern Present  . Feeling of Stress : Not at all  Social Connections: Moderately Isolated  . Frequency of Communication with Friends and Family: More than three times a week  . Frequency of Social Gatherings with Friends and Family: More than three times a week  . Attends Religious Services: Never  . Active Member of Clubs or Organizations: No  .  Attends Archivist Meetings: Never  . Marital Status: Married   Family History  Problem Relation Age of Onset  . Stroke Mother   . Irregular heart beat Mother   . Heart murmur Brother   . Pulmonary embolism Brother   . Hypertension Other   . Pulmonary embolism Maternal Uncle    Allergies  Allergen Reactions  . Codeine Nausea Only  . Digoxin And Related     SOB, bad dreams  . Macrodantin [Nitrofurantoin Macrocrystal] Rash  . Morphine And Related Rash   Prior to Admission medications   Medication Sig Start Date End Date Taking? Authorizing Provider  HYDROcodone-acetaminophen (HYCET) 7.5-325 mg/15 ml solution Take 10-15  mLs by mouth every 6 (six) hours as needed for moderate pain. 03/05/21 03/05/22  Rozetta Nunnery, MD  Acetaminophen (TYLENOL 8 HOUR PO) Take by mouth every 8 (eight) hours. As needed    [provider]  busPIRone (BUSPAR) 5 MG tablet Take 1 tablet (5 mg total) by mouth 3 (three) times daily as needed (anxiety). 02/12/21   Karamalegos, Devonne Doughty, DO  cephALEXin (KEFLEX) 500 MG capsule Take 1 capsule (500 mg total) by mouth 3 (three) times daily. 03/04/21   Rozetta Nunnery, MD  furosemide (LASIX) 40 MG tablet Take 1 tablet (40 mg total) by mouth daily as needed (As needed for weight gain or shortness of breath). 02/09/21   Theora Gianotti, NP  Homeopathic Products (HOMEOPATHIC CALM SL) Place under the tongue. Is using Doterra essential oil of Onguard ingestion daily    [provider]  ipratropium (ATROVENT) 0.02 % nebulizer solution Take 2.5 mLs (0.5 mg total) by nebulization 4 (four) times daily. 02/20/21   Rozetta Nunnery, MD  ipratropium (ATROVENT) 0.06 % nasal spray Place 2 sprays into both nostrils 4 (four) times daily. As needed 02/06/21   [name], DO  mupirocin ointment (BACTROBAN) 2 % Apply 1 application topically 3 (three) times daily. 02/18/21   [provider]  QUEtiapine (SEROQUEL) 25 MG  tablet Take 1 tablet (25 mg total) by mouth at bedtime. 02/06/21   [name], DO     Physical Exam: Donor site has mild erythema and sutures were removed. The left intranasal packing and Xeroform was removed from the full-thickness skin graft.  This appears to have taken nicely.  Applied bacitracin ointment to the skin graft.   Assessment: S/p excision of left intranasal squamous cell carcinoma with placement of full-thickness skin graft.  Plan: Sutures and packing were removed in the office today.  I recommended application of the mupirocin ointment to both sites twice a day for the next 4 days.  He will return in 4 days for recheck and cleaning. Final path report is still pending.  We will also need to review this with the patient   Radene Journey, MD

## 2021-03-09 NOTE — Progress Notes (Signed)
EPIC Encounter for ICM Monitoring  Patient Name: Michael Delacruz is a 80 y.o. male Date: 03/09/2021 Primary Care Physican: [name], DO Primary Cardiologist: Caryl Comes Electrophysiologist: Vergie Living Pacing: 94%            02/20/2021 Weight:174-178 lbs                                                            Spoke with patient and reports pt had nasal surgery last week.   03/06/2021 decreased impedance correlates recent surgery.  CorVue thoracic impedance returned to normal 3/10-3/18 and then returned to suggesting possible fluid accumulation starting 3/18.   Prescribed:  Furosemide 40 mg Take 1 tablet (40 mg total) by mouth daily as needed (As needed for weight gain or shortness of breath).  Recommendations:  No changes and encouraged to call if experiencing any fluid symptoms.  Follow-up plan: ICM clinic phone appointment on 03/17/2021 to recheck fluid levels.   91 day device clinic remote transmission 04/28/2021.    EP/Cardiology Office Visits: 05/11/2021 with Dr. [name].    Copy of ICM check sent to Dr. Caryl Comes.   3 month ICM trend: 03/09/2021.    1 Year ICM trend:       [name], RN 03/09/2021 10:41 AM

## 2021-03-11 ENCOUNTER — Other Ambulatory Visit: Payer: Self-pay | Admitting: Family Medicine

## 2021-03-11 DIAGNOSIS — G4701 Insomnia due to medical condition: Secondary | ICD-10-CM

## 2021-03-11 DIAGNOSIS — F331 Major depressive disorder, recurrent, moderate: Secondary | ICD-10-CM

## 2021-03-11 DIAGNOSIS — F411 Generalized anxiety disorder: Secondary | ICD-10-CM

## 2021-03-11 LAB — SURGICAL PATHOLOGY

## 2021-03-11 NOTE — Telephone Encounter (Signed)
Requested medication (s) are due for refill today: yes  Requested medication (s) are on the active medication list: yes  Last refill:  02/06/21 #30 2 refills  Future visit scheduled: yes in 9 months  Notes to clinic:  not delegated per protocol, request for 90 days , dx code needed     Requested Prescriptions  Pending Prescriptions Disp Refills   QUEtiapine (SEROQUEL) 25 MG tablet [Pharmacy Med Name: QUETIAPINE FUMARATE 25 MG TAB] 90 tablet 1    Sig: TAKE 1 TABLET BY MOUTH EVERYDAY AT BEDTIME      Not Delegated - Psychiatry:  Antipsychotics - Second Generation (Atypical) - quetiapine Failed - 03/11/2021 10:32 AM      Failed - This refill cannot be delegated      Failed - ALT in normal range and within 180 days    ALT  Date Value Ref Range Status  01/31/2021 77 (H) 0 - 44 U/L Final   SGPT (ALT)  Date Value Ref Range Status  01/24/2015 19 14 - 63 U/L Final          Failed - AST in normal range and within 180 days    AST  Date Value Ref Range Status  01/31/2021 100 (H) 15 - 41 U/L Final   SGOT(AST)  Date Value Ref Range Status  01/24/2015 13 (L) 15 - 37 Unit/L Final          Passed - Completed PHQ-2 or PHQ-9 in the last 360 days      Passed - Last BP in normal range    BP Readings from Last 1 Encounters:  03/04/21 (!) 128/57          Passed - Valid encounter within last 6 months    Recent Outpatient Visits           1 month ago Depression, major, recurrent, moderate ([place])   [place], DO   1 month ago COVID-19 virus infection   [place], DO   1 year ago Generalized anxiety disorder   [place], DO   1 year ago Generalized anxiety disorder   [place], DO   1 year ago Recurrent falls   [place], DO       Future Appointments             In  2 months Gollan, Kathlene November, MD [facility], [place]   In 9 months  [facility], [facility]

## 2021-03-12 ENCOUNTER — Telehealth: Payer: Self-pay

## 2021-03-13 ENCOUNTER — Ambulatory Visit (INDEPENDENT_AMBULATORY_CARE_PROVIDER_SITE_OTHER): Payer: Medicare HMO | Admitting: Otolaryngology

## 2021-03-13 ENCOUNTER — Other Ambulatory Visit: Payer: Self-pay

## 2021-03-13 DIAGNOSIS — Z4889 Encounter for other specified surgical aftercare: Secondary | ICD-10-CM

## 2021-03-13 NOTE — Progress Notes (Signed)
HPI: Michael Delacruz is a 80 y.o. male who presents 9 days s/p excision of left nasal squamous cell carcinoma.  Final pathology report revealed well-differentiated squamous cell carcinoma with negative margins. Patient complains of congestion on the left side..   Past Medical History:  Diagnosis Date  . Anxiety   . Aortic dilatation ([place])    a. 11/2018 Echo: Ao root 33mm, Asc Ao 66mm.  . Arthritis   . Asthma   . Bradycardia   . CAD in native artery    a. LHC 09/2015: 40% pCx, 35% mRCA.  [name] CHF (congestive heart failure) ([place])   . COPD (chronic obstructive pulmonary disease) ([place])   . Dilation of intestine 01/2015  . Fibromyalgia   . Frequent headaches   . GERD (gastroesophageal reflux disease)   . History of blood clots    eye   . History of hiatal hernia   . History of kidney stones   . History of rheumatic fever   . Hypertension   . NICM (nonischemic cardiomyopathy) ([place])    a. 07/2017 Echo: EF reduced to 40-45%; b. 07/2018 Echo: EF 25-30%; c. 11/2018 Echo: EF 30-35%, diff HK w/ sev inf HK, Ao root 21mm, Asc Ao 29mm, Mild MS (grad 62mmHg), mildly dil LA.  [name] PAF (paroxysmal atrial fibrillation) ([place])    a. s/p TEE/DCCV 07/2015. b. H/o bleeding on Coumadin when INR >5, changed to Eliquis-subsequently discontinued; c. 04/2018 s/p SJM 3562 Quadra Allure MP DC PPM & AVN ablation.  . S/P Minimally invasive maze operation for atrial fibrillation 10/22/2015   Complete bilateral atrial lesion set using cryothermy and bipolar radiofrequency ablation with clipping of LA appendage via right mini thoracotomy approach  . S/P minimally invasive mitral valve repair 10/22/2015   Complex valvuloplasty including triangular resection of posterior leaflet, artificial Gore-tex neochord placement x6 and 38 mm Sorin Memo 3D Rechord ring annuloplasty via right minithoracotomy approach  . Severe mitral regurgitation s/p MVR    a. s/p MV repair 10/2015; b. 11/2018 Echo: Mild MS (mean grad 29mmHg).  . Sleep apnea     no longer uses cpap and doesn't use O2 at home  . Stroke [facility])     no vision in right, on occasion sees a pinpoint   . Thyroid disorder   . TIA (transient ischemic attack)   . Tobacco abuse    Past Surgical History:  Procedure Laterality Date  . ANKLE SURGERY    . AV NODE ABLATION N/A 05/04/2018   Procedure: AV NODE ABLATION;  Surgeon: Thompson Grayer, MD;  Location: [place] [facility];  Service: Cardiovascular;  Laterality: N/A;  . BIV PACEMAKER INSERTION CRT-P N/A 05/03/2018   Procedure: BIV PACEMAKER INSERTION CRT-P;  Surgeon: Deboraha Sprang, MD;  Location: [place] [facility];  Service: Cardiovascular;  Laterality: N/A;  . CARDIAC CATHETERIZATION N/A 10/03/2015   Procedure: Right and Left Heart Cath and Coronary Angiography;  Surgeon: [name], MD;  Location: [place] [facility];  Service: Cardiovascular;  Laterality: N/A;  . COLONOSCOPY    . ELECTROPHYSIOLOGIC STUDY N/A 08/18/2015   Procedure: CARDIOVERSION;  Surgeon: Wellington Hampshire, MD;  Location: [facility] ORS;  Service: Cardiovascular;  Laterality: N/A;  . MASS EXCISION N/A 03/04/2021   Procedure: EXCISION INNER NASAL CANCER;  Surgeon: Rozetta Nunnery, MD;  Location: [facility] OR;  Service: ENT;  Laterality: N/A;  . MINIMALLY INVASIVE MAZE PROCEDURE N/A 10/22/2015   Procedure: MINIMALLY INVASIVE MAZE PROCEDURE;  Surgeon: Rexene Alberts, MD;  Location:  [place] OR;  Service: Open Heart Surgery;  Laterality: N/A;  . MITRAL VALVE REPAIR Right 10/22/2015   Procedure: MINIMALLY INVASIVE MITRAL VALVE REPAIR (MVR);  Surgeon: Rexene Alberts, MD;  Location: [place];  Service: Open Heart Surgery;  Laterality: Right;  . SINUS EXPLORATION    . SKIN FULL THICKNESS GRAFT N/A 03/04/2021   Procedure: SKIN GRAFT FULL THICKNESS;  Surgeon: Rozetta Nunnery, MD;  Location: [place];  Service: ENT;  Laterality: N/A;  . TEE WITHOUT CARDIOVERSION N/A 08/18/2015   Procedure: TRANSESOPHAGEAL ECHOCARDIOGRAM (TEE);  Surgeon: Wellington Hampshire, MD;  Location: [facility]  ORS;  Service: Cardiovascular;  Laterality: N/A;  . TEE WITHOUT CARDIOVERSION N/A 10/22/2015   Procedure: TRANSESOPHAGEAL ECHOCARDIOGRAM (TEE);  Surgeon: Rexene Alberts, MD;  Location: [place];  Service: Open Heart Surgery;  Laterality: N/A;   Social History   Socioeconomic History  . Marital status: Married    Spouse name: [name]  . Number of children: 3  . Years of education: GED  . Highest education level: GED or equivalent  Occupational History  . Not on file  Tobacco Use  . Smoking status: Current Every Day Smoker    Packs/day: 0.25    Years: 52.00    Pack years: 13.00    Types: Cigarettes    Start date: 61  . Smokeless tobacco: Never Used  . Tobacco comment: Resumed smoking, after quit 01/2018  Vaping Use  . Vaping Use: Never used  Substance and Sexual Activity  . Alcohol use: Not Currently    Alcohol/week: 1.0 standard drink    Types: 1 Standard drinks or equivalent per week    Comment: occasionally drinks a margarita  . Drug use: No  . Sexual activity: Not on file  Other Topics Concern  . Not on file  Social History Narrative   Right handed    2-3 cups coffee per day         Social Determinants of Health   Financial Resource Strain: High Risk  . Difficulty of Paying Living Expenses: Hard  Food Insecurity: Food Insecurity Present  . Worried About Charity fundraiser in the Last Year: Often true  . Ran Out of Food in the Last Year: Often true  Transportation Needs: No Transportation Needs  . Lack of Transportation (Medical): No  . Lack of Transportation (Non-Medical): No  Physical Activity: Inactive  . Days of Exercise per Week: 0 days  . Minutes of Exercise per Session: 0 min  Stress: No Stress Concern Present  . Feeling of Stress : Not at all  Social Connections: Moderately Isolated  . Frequency of Communication with Friends and Family: More than three times a week  . Frequency of Social Gatherings with Friends and Family: More than three times a week   . Attends Religious Services: Never  . Active Member of Clubs or Organizations: No  . Attends Archivist Meetings: Never  . Marital Status: Married   Family History  Problem Relation Age of Onset  . Stroke Mother   . Irregular heart beat Mother   . Heart murmur Brother   . Pulmonary embolism Brother   . Hypertension Other   . Pulmonary embolism Maternal Uncle    Allergies  Allergen Reactions  . Codeine Nausea Only  . Digoxin And Related     SOB, bad dreams  . Macrodantin [Nitrofurantoin Macrocrystal] Rash  . Morphine And Related Rash   Prior to Admission medications   Medication Sig Start Date End  Date Taking? Authorizing Provider  HYDROcodone-acetaminophen (HYCET) 7.5-325 mg/15 ml solution Take 10-15 mLs by mouth every 6 (six) hours as needed for moderate pain. 03/05/21 03/05/22  Rozetta Nunnery, MD  Acetaminophen (TYLENOL 8 HOUR PO) Take by mouth every 8 (eight) hours. As needed    [provider]  busPIRone (BUSPAR) 5 MG tablet Take 1 tablet (5 mg total) by mouth 3 (three) times daily as needed (anxiety). 02/12/21   Karamalegos, Devonne Doughty, DO  cephALEXin (KEFLEX) 500 MG capsule Take 1 capsule (500 mg total) by mouth 3 (three) times daily. 03/04/21   Rozetta Nunnery, MD  furosemide (LASIX) 40 MG tablet Take 1 tablet (40 mg total) by mouth daily as needed (As needed for weight gain or shortness of breath). 02/09/21   Theora Gianotti, NP  Homeopathic Products (HOMEOPATHIC CALM SL) Place under the tongue. Is using Doterra essential oil of Onguard ingestion daily    [provider]  ipratropium (ATROVENT) 0.02 % nebulizer solution Take 2.5 mLs (0.5 mg total) by nebulization 4 (four) times daily. 02/20/21   Rozetta Nunnery, MD  ipratropium (ATROVENT) 0.06 % nasal spray Place 2 sprays into both nostrils 4 (four) times daily. As needed 02/06/21   [name], DO  mupirocin ointment (BACTROBAN) 2 % Apply 1 application  topically 3 (three) times daily. 02/18/21   [provider]  QUEtiapine (SEROQUEL) 25 MG tablet TAKE 1 TABLET BY MOUTH EVERYDAY AT BEDTIME 03/11/21   [name], DO     Physical Exam: He has a fair amount of scabbing and crusting where the graft was placed.  Everything seems to be healing nicely otherwise.  Some of the graft crusting and scabbing was removed and he was able to breathe a little bit better.   Assessment: S/p excision of left nasal squamous cell carcinoma.  Plan: Reviewed pathology report with patient and his wife.  This showed negative margins. Recommended use of saline irrigation as well as nasal gel to help keep the nose clear.  He will continue to apply mupirocin 2% ointment to 3 times a day to the nostril.  He will follow-up in 2 weeks for recheck and cleaning. Also discussed with him concerning possible use of Breathe Right strips.   Radene Journey, MD

## 2021-03-14 DIAGNOSIS — J432 Centrilobular emphysema: Secondary | ICD-10-CM | POA: Diagnosis not present

## 2021-03-14 DIAGNOSIS — F411 Generalized anxiety disorder: Secondary | ICD-10-CM | POA: Diagnosis not present

## 2021-03-14 DIAGNOSIS — G4700 Insomnia, unspecified: Secondary | ICD-10-CM | POA: Diagnosis not present

## 2021-03-14 DIAGNOSIS — F32A Depression, unspecified: Secondary | ICD-10-CM | POA: Diagnosis not present

## 2021-03-14 DIAGNOSIS — U099 Post covid-19 condition, unspecified: Secondary | ICD-10-CM | POA: Diagnosis not present

## 2021-03-14 DIAGNOSIS — G933 Postviral fatigue syndrome: Secondary | ICD-10-CM | POA: Diagnosis not present

## 2021-03-14 DIAGNOSIS — G4733 Obstructive sleep apnea (adult) (pediatric): Secondary | ICD-10-CM | POA: Diagnosis not present

## 2021-03-14 DIAGNOSIS — I110 Hypertensive heart disease with heart failure: Secondary | ICD-10-CM | POA: Diagnosis not present

## 2021-03-14 DIAGNOSIS — M6250 Muscle wasting and atrophy, not elsewhere classified, unspecified site: Secondary | ICD-10-CM | POA: Diagnosis not present

## 2021-03-17 ENCOUNTER — Ambulatory Visit (INDEPENDENT_AMBULATORY_CARE_PROVIDER_SITE_OTHER): Payer: Medicare HMO

## 2021-03-17 ENCOUNTER — Telehealth: Payer: Self-pay

## 2021-03-17 DIAGNOSIS — Z950 Presence of cardiac pacemaker: Secondary | ICD-10-CM

## 2021-03-17 DIAGNOSIS — I5020 Unspecified systolic (congestive) heart failure: Secondary | ICD-10-CM

## 2021-03-17 NOTE — Telephone Encounter (Signed)
Remote ICM transmission received.  Attempted call to patient regarding ICM remote transmission and left detailed message per DPR.  Advised to return call for any fluid symptoms or questions. Next ICM remote transmission scheduled 04/13/2021.

## 2021-03-17 NOTE — Progress Notes (Signed)
EPIC Encounter for ICM Monitoring  Patient Name: Michael Delacruz is a 80 y.o. male Date: 03/17/2021 Primary Care Physican: [name], DO Primary Cardiologist:Klein Electrophysiologist:Klein Bi-V Pacing:94% 02/20/2021 Weight:174-178lbs    Attempted call to patient and unable to reach.  Left detailed message per DPR regarding transmission. Transmission reviewed.   CorVue thoracic impedance returned to normal.  Prescribed:  Furosemide40 mgTake 1 tablet (40 mg total) by mouth daily as needed (As needed for weight gain or shortness of breath).  Recommendations: Left voice mail with ICM number and encouraged to call if experiencing any fluid symptoms..  Follow-up plan: ICM clinic phone appointment on 04/13/2021. 91 day device clinic remote transmission 04/28/2021.   EP/Cardiology Office Visits:05/11/2021 with Dr.Gollan.   Copy of ICM check sent to [facility].  3 month ICM trend: 03/17/2021.    1 Year ICM trend:       [name], RN 03/17/2021 4:07 PM

## 2021-03-23 ENCOUNTER — Telehealth: Payer: Self-pay

## 2021-03-23 ENCOUNTER — Telehealth: Payer: Self-pay | Admitting: General Practice

## 2021-03-23 NOTE — Telephone Encounter (Signed)
  Chronic Care Management   Outreach Note  03/23/2021 Name: Michael Delacruz MRN: [id number] DOB: August 24, 1941  Referred by: [name], DO Reason for referral : Appointment (RNCM: Follow up for Chronic Disease Management and Care Coordination Needs)   An unsuccessful telephone outreach was attempted today. The patient was referred to the case management team for assistance with care management and care coordination.   Follow Up Plan: A HIPAA compliant phone message was left for the patient providing contact information and requesting a return call.   Noreene Larsson RN, MSN, [place] Mobile: [phone]

## 2021-03-24 ENCOUNTER — Ambulatory Visit (INDEPENDENT_AMBULATORY_CARE_PROVIDER_SITE_OTHER): Payer: Medicare HMO | Admitting: Licensed Clinical Social Worker

## 2021-03-24 DIAGNOSIS — F331 Major depressive disorder, recurrent, moderate: Secondary | ICD-10-CM

## 2021-03-24 DIAGNOSIS — F411 Generalized anxiety disorder: Secondary | ICD-10-CM

## 2021-03-24 DIAGNOSIS — I5032 Chronic diastolic (congestive) heart failure: Secondary | ICD-10-CM

## 2021-03-24 DIAGNOSIS — G4701 Insomnia due to medical condition: Secondary | ICD-10-CM

## 2021-03-24 NOTE — Chronic Care Management (AMB) (Signed)
Chronic Care Management    Clinical Social Work Note  03/24/2021 Name: Michael Delacruz MRN: [id number] DOB: [birth year]  Michael Delacruz is a 80 y.o. year old male who is a primary care patient of [name], DO. The CCM team was consulted to assist the patient with chronic disease management and/or care coordination needs related to: [employer] , Level of Care Concerns and Mental Health Counseling and Resources.   Collaboration with spouse during outreach for follow up visit in response to provider referral for social work chronic care management and care coordination services.   Consent to Services:  The patient was given the following information about Chronic Care Management services today, agreed to services, and gave verbal consent: 1. CCM service includes personalized support from designated clinical staff supervised by the primary care provider, including individualized plan of care and coordination with other care providers 2. 24/7 contact phone numbers for assistance for urgent and routine care needs. 3. Service will only be billed when office clinical staff spend 20 minutes or more in a month to coordinate care. 4. Only one practitioner may furnish and bill the service in a calendar month. 5.The patient may stop CCM services at any time (effective at the end of the month) by phone call to the office staff. 6. The patient will be responsible for cost sharing (co-pay) of up to 20% of the service fee (after annual deductible is met). Patient agreed to services and consent obtained.  Patient agreed to services and consent obtained.   Assessment: Review of patient past medical history, allergies, medications, and health status, including review of relevant consultants reports was performed today as part of a comprehensive evaluation and provision of chronic care management and care coordination services.     SDOH (Social Determinants of Health) assessments and  interventions performed:    Advanced Directives Status: Not addressed in this encounter.  CCM Care Plan  Allergies  Allergen Reactions  . Codeine Nausea Only  . Digoxin And Related     SOB, bad dreams  . Macrodantin [Nitrofurantoin Macrocrystal] Rash  . Morphine And Related Rash    Outpatient Encounter Medications as of 03/24/2021  Medication Sig Note  . HYDROcodone-acetaminophen (HYCET) 7.5-325 mg/15 ml solution Take 10-15 mLs by mouth every 6 (six) hours as needed for moderate pain.   . Acetaminophen (TYLENOL 8 HOUR PO) Take by mouth every 8 (eight) hours. As needed   . busPIRone (BUSPAR) 5 MG tablet Take 1 tablet (5 mg total) by mouth 3 (three) times daily as needed (anxiety). 03/03/2021: Pt states he may take 1 daily if needed  . cephALEXin (KEFLEX) 500 MG capsule Take 1 capsule (500 mg total) by mouth 3 (three) times daily.   . furosemide (LASIX) 40 MG tablet Take 1 tablet (40 mg total) by mouth daily as needed (As needed for weight gain or shortness of breath). 03/03/2021: Takes daily   . Homeopathic Products (HOMEOPATHIC CALM SL) Place under the tongue. Is using Doterra essential oil of Onguard ingestion daily   . ipratropium (ATROVENT) 0.02 % nebulizer solution Take 2.5 mLs (0.5 mg total) by nebulization 4 (four) times daily. 03/03/2021: No longer using this medication  . ipratropium (ATROVENT) 0.06 % nasal spray Place 2 sprays into both nostrils 4 (four) times daily. As needed   . mupirocin ointment (BACTROBAN) 2 % Apply 1 application topically 3 (three) times daily. 03/03/2021: No longer using this medication  . QUEtiapine (SEROQUEL) 25 MG tablet TAKE 1 TABLET BY  MOUTH EVERYDAY AT BEDTIME    No facility-administered encounter medications on file as of 03/24/2021.    Patient Active Problem List   Diagnosis Date Noted  . Failure to thrive in adult 02/01/2021  . Altered mental status 01/31/2021  . CHB (complete heart block) ([place]) 01/31/2021  . Encephalopathy 01/31/2021  .  Generalized anxiety disorder 05/04/2018  . OSA (obstructive sleep apnea) 05/04/2018  . History of stroke 05/04/2018  . Retinal hemorrhage, left eye 05/04/2018  . Late effects of cerebral ischemic stroke 05/04/2018  . Hypothyroidism 05/04/2018  . Permanent atrial fibrillation ([place]) 05/02/2018  . Tachycardia induced cardiomyopathy ([place]) 05/02/2018  . Hypokalemia 05/02/2018  . Constipation 04/25/2018  . Insomnia due to medical condition 02/22/2018  . Depression, major, recurrent, moderate ([place]) 01/16/2018  . Somatic symptom disorder 01/16/2018  . Vision loss 12/31/2015  . TIA (transient ischemic attack) 12/06/2015  . S/P MVR (mitral valve repair) 11/11/2015  . S/P Minimally invasive maze operation for atrial fibrillation 10/22/2015  . Systolic CHF, chronic ([place])   . Atrial fibrillation with RVR ([place])   . Centrilobular emphysema ([place])   . Hypotension 11/02/2012  . Stress at home 11/02/2012  . Smoking 04/27/2012  . Hyperlipidemia 04/27/2012  . CAD (coronary artery disease) 04/27/2012  . Heart palpitations 09/03/2011  . Dizziness 05/19/2011  . MVP (mitral valve prolapse) 05/05/2011  . Mitral valve regurgitation 05/05/2011  . Pulmonary HTN ([place]) 05/05/2011    Conditions to be addressed/monitored: Anxiety; Limited social support, Mental Health Concerns  and Social Isolation  Care Plan : General Social Work (Adult)  Updates made by Greg Cutter, LCSW since 03/24/2021 12:00 AM    Problem: Quality of Life (General Plan of Care)     Long-Range Goal: Quality of Life Maintained   Start Date: 02/10/2021  Note:    Timeframe:  Long-Range Goal Priority:  Medium  Start Date:  02/10/21                         Expected End Date:  05/10/21                    Follow Up Date- 05/06/21  Current Barriers:  . Financial constraints related to managing health care . Housing barriers . ADL IADL limitations . Social Isolation  Clinical Social Work Clinical Goal(s):  [name] Over the next 90 days,  client will work with SW to address concerns related to knowledge deficit of appropriate self-care and depression management tools to implement into his daily routine to promote healthy living   Interventions: . Patient interviewed and appropriate assessments performed . Patient had a recent hospital admission on 01/30/21 for Altered Mental Status. CCM LCSW spoke with patient's spouse on 03/24/21 and she confirms that [facility] PT is involved and they are coming out 2x per week. [facility] is still coming once per month to complete home visits as well. Per spouse, patient continues to struggle with weakness in the legs but has not had any recent falls.  . Patient went to his cardiologist appointment and ENT appointment. Spouse continues to provide stable transportation to all medical appointments for patient.  . Per spouse, patient's appetite remains low. Patient reports that he has NO taste. CCM LCSW provided education on what healthy self-care looks like and how to improve his eating habits. Patient is actively drinking ensure. . Discussed plans with patient for ongoing care management follow up and provided patient with direct contact information for care management  team . Advised patient to consider grief therapy due to the loss he has experienced this year. Patient's mother in law passed away recently. Patient declined referral for telephonic grief counseling but was appreciative of education on resource.  . Patient's spouse was informed that current CCM LCSW will be leaving position next month and his next CCM Social Work follow up visit will be with another LCSW. Patient was appreciative of support provided and receptive to news . Patient ask that I send him a text message of his upcoming appointments. Text messages sent on 02/18/21.  . Patient was diagnosed with squamous cell carcinoma and completed an excision inner nasal cancer procedure surgery for 02/24/21. Patient has a follow up appointment regarding this  on 03/26/21. Spouse reports that patient has been very worried about this. Emotional support provided to family.  . Assisted patient/caregiver with obtaining information about health plan benefits . Provided education on available community resources within the local area that patient may benefit from. . Provided mental health counseling with regard to his daily difficulty with mobility/weakness/balance. Patient has frequent falls and does not have a medical alert system. LCSW used active and reflective listening and implemented appropriate interventions to help suppport patient and his emotional needs. Advised patient/family to implement deep breathing/grounding/meditation/self-care exercises into his daily routine to combat stressors. Education provided.  . Past update-Patient is 3 months late on rent and is experiencing food insecurity. Patient reports that his financial difficulties have increased his stress, anxiety and depression. C3 Guide previously worked with patient and completed [employer] application for rent assistance. Patient is now interested in getting food stamps are family is unable to afford groceries. CCM LCSW placed new C3 referral for EBT enrollment assistance on 02/18/21  Patient Self Care Activities:  . Attends all scheduled provider appointments . Calls provider office for new concerns or questions  Please see past updates related to this goal by clicking on the "Past Updates" button in the selected goal        Follow Up Plan: SW will follow up with patient by phone over the next 45 days      Eula Fried, [employer], MSW, [email or web address] Phone: [phone]

## 2021-03-24 NOTE — Patient Instructions (Signed)
Licensed Clinical Social Worker Visit Information  Goals we discussed today:  Goals Addressed              This Visit's Progress   .  SW-"I need to take better care of myself." (pt-stated)         Timeframe:  Long-Range Goal Priority:  Medium  Start Date:  02/10/21                         Expected End Date:  05/10/21                    Follow Up Date- 05/06/21  Current Barriers:  . Financial constraints related to managing health care . Housing barriers . ADL IADL limitations . Social Isolation  Clinical Social Work Clinical Goal(s):  [name] Over the next 90 days, client will work with SW to address concerns related to knowledge deficit of appropriate self-care and depression management tools to implement into his daily routine to promote healthy living   Interventions: . Patient interviewed and appropriate assessments performed . Patient had a recent hospital admission on 01/30/21 for Altered Mental Status. CCM LCSW spoke with patient's spouse on 03/24/21 and she confirms that [facility] PT is involved and they are coming out 2x per week. [facility] is still coming once per month to complete home visits as well. Per spouse, patient continues to struggle with weakness in the legs but has not had any recent falls.  . Patient went to his cardiologist appointment and ENT appointment. Spouse continues to provide stable transportation to all medical appointments for patient.  . Per spouse, patient's appetite remains low. Patient reports that he has NO taste. CCM LCSW provided education on what healthy self-care looks like and how to improve his eating habits. Patient is actively drinking ensure. . Discussed plans with patient for ongoing care management follow up and provided patient with direct contact information for care management team . Advised patient to consider grief therapy due to the loss he has experienced this year. Patient's mother in law passed away recently. Patient declined referral for  telephonic grief counseling but was appreciative of education on resource.  . Patient's spouse was informed that current CCM LCSW will be leaving position next month and his next CCM Social Work follow up visit will be with another LCSW. Patient was appreciative of support provided and receptive to news . Patient ask that I send him a text message of his upcoming appointments. Text messages sent on 02/18/21.  . Patient was diagnosed with squamous cell carcinoma and completed an excision inner nasal cancer procedure surgery for 02/24/21. Patient has a follow up appointment regarding this on 03/26/21. Spouse reports that patient has been very worried about this. Emotional support provided to family.  . Assisted patient/caregiver with obtaining information about health plan benefits . Provided education on available community resources within the local area that patient may benefit from. . Provided mental health counseling with regard to his daily difficulty with mobility/weakness/balance. Patient has frequent falls and does not have a medical alert system. LCSW used active and reflective listening and implemented appropriate interventions to help suppport patient and his emotional needs. Advised patient/family to implement deep breathing/grounding/meditation/self-care exercises into his daily routine to combat stressors. Education provided.  . Past update-Patient is 3 months late on rent and is experiencing food insecurity. Patient reports that his financial difficulties have increased his stress, anxiety and depression. C3 Guide previously worked with  patient and completed [facility] application for rent assistance. Patient is now interested in getting food stamps are family is unable to afford groceries. CCM LCSW placed new C3 referral for EBT enrollment assistance on 02/18/21  Patient Self Care Activities:  . Attends all scheduled provider appointments . Calls provider office for new concerns or questions  Please  see past updates related to this goal by clicking on the "Past Updates" button in the selected goal        Eula Fried, [employer], MSW, [email or web address] Phone: [phone]

## 2021-03-26 ENCOUNTER — Other Ambulatory Visit: Payer: Self-pay

## 2021-03-26 ENCOUNTER — Ambulatory Visit (INDEPENDENT_AMBULATORY_CARE_PROVIDER_SITE_OTHER): Payer: Medicare HMO | Admitting: Otolaryngology

## 2021-03-26 VITALS — Temp 96.1°F

## 2021-03-26 DIAGNOSIS — Z4889 Encounter for other specified surgical aftercare: Secondary | ICD-10-CM

## 2021-03-26 NOTE — Progress Notes (Signed)
HPI: Michael Delacruz is a 80 y.o. male who presents 3 weeks days s/p excision of left septal squamous cell carcinoma with placement of skin graft.  He still having trouble breathing through the left nostril..   Past Medical History:  Diagnosis Date  . Anxiety   . Aortic dilatation ([place])    a. 11/2018 Echo: Ao root 56mm, Asc Ao 68mm.  . Arthritis   . Asthma   . Bradycardia   . CAD in native artery    a. LHC 09/2015: 40% pCx, 35% mRCA.  [name] CHF (congestive heart failure) ([place])   . COPD (chronic obstructive pulmonary disease) ([place])   . Dilation of intestine 01/2015  . Fibromyalgia   . Frequent headaches   . GERD (gastroesophageal reflux disease)   . History of blood clots    eye   . History of hiatal hernia   . History of kidney stones   . History of rheumatic fever   . Hypertension   . NICM (nonischemic cardiomyopathy) ([place])    a. 07/2017 Echo: EF reduced to 40-45%; b. 07/2018 Echo: EF 25-30%; c. 11/2018 Echo: EF 30-35%, diff HK w/ sev inf HK, Ao root 31mm, Asc Ao 76mm, Mild MS (grad 63mmHg), mildly dil LA.  [name] PAF (paroxysmal atrial fibrillation) ([place])    a. s/p TEE/DCCV 07/2015. b. H/o bleeding on Coumadin when INR >5, changed to Eliquis-subsequently discontinued; c. 04/2018 s/p SJM 3562 Quadra Allure MP DC PPM & AVN ablation.  . S/P Minimally invasive maze operation for atrial fibrillation 10/22/2015   Complete bilateral atrial lesion set using cryothermy and bipolar radiofrequency ablation with clipping of LA appendage via right mini thoracotomy approach  . S/P minimally invasive mitral valve repair 10/22/2015   Complex valvuloplasty including triangular resection of posterior leaflet, artificial Gore-tex neochord placement x6 and 38 mm Sorin Memo 3D Rechord ring annuloplasty via right minithoracotomy approach  . Severe mitral regurgitation s/p MVR    a. s/p MV repair 10/2015; b. 11/2018 Echo: Mild MS (mean grad 7mmHg).  . Sleep apnea    no longer uses cpap and doesn't use O2 at home  .  Stroke [facility])     no vision in right, on occasion sees a pinpoint   . Thyroid disorder   . TIA (transient ischemic attack)   . Tobacco abuse    Past Surgical History:  Procedure Laterality Date  . ANKLE SURGERY    . AV NODE ABLATION N/A 05/04/2018   Procedure: AV NODE ABLATION;  Surgeon: Thompson Grayer, MD;  Location: [place] [facility];  Service: Cardiovascular;  Laterality: N/A;  . BIV PACEMAKER INSERTION CRT-P N/A 05/03/2018   Procedure: BIV PACEMAKER INSERTION CRT-P;  Surgeon: Deboraha Sprang, MD;  Location: [place] [facility];  Service: Cardiovascular;  Laterality: N/A;  . CARDIAC CATHETERIZATION N/A 10/03/2015   Procedure: Right and Left Heart Cath and Coronary Angiography;  Surgeon: [name], MD;  Location: [place] [facility];  Service: Cardiovascular;  Laterality: N/A;  . COLONOSCOPY    . ELECTROPHYSIOLOGIC STUDY N/A 08/18/2015   Procedure: CARDIOVERSION;  Surgeon: Wellington Hampshire, MD;  Location: [facility] OR[facility];  Service: Cardiovascular;  Laterality: N/A;  . MASS EXCISION N/A 03/04/2021   Procedure: EXCISION INNER NASAL CANCER;  Surgeon: Rozetta Nunnery, MD;  Location: [facility] OR;  Service: ENT;  Laterality: N/A;  . MINIMALLY INVASIVE MAZE PROCEDURE N/A 10/22/2015   Procedure: MINIMALLY INVASIVE MAZE PROCEDURE;  Surgeon: Rexene Alberts, MD;  Location: [place];  Service:  Open Heart Surgery;  Laterality: N/A;  . MITRAL VALVE REPAIR Right 10/22/2015   Procedure: MINIMALLY INVASIVE MITRAL VALVE REPAIR (MVR);  Surgeon: Rexene Alberts, MD;  Location: [place];  Service: Open Heart Surgery;  Laterality: Right;  . SINUS EXPLORATION    . SKIN FULL THICKNESS GRAFT N/A 03/04/2021   Procedure: SKIN GRAFT FULL THICKNESS;  Surgeon: Rozetta Nunnery, MD;  Location: [place];  Service: ENT;  Laterality: N/A;  . TEE WITHOUT CARDIOVERSION N/A 08/18/2015   Procedure: TRANSESOPHAGEAL ECHOCARDIOGRAM (TEE);  Surgeon: Wellington Hampshire, MD;  Location: [facility] OR[facility];  Service: Cardiovascular;  Laterality: N/A;  .  TEE WITHOUT CARDIOVERSION N/A 10/22/2015   Procedure: TRANSESOPHAGEAL ECHOCARDIOGRAM (TEE);  Surgeon: Rexene Alberts, MD;  Location: [place];  Service: Open Heart Surgery;  Laterality: N/A;   Social History   Socioeconomic History  . Marital status: Married    Spouse name: [name]  . Number of children: 3  . Years of education: GED  . Highest education level: GED or equivalent  Occupational History  . Not on file  Tobacco Use  . Smoking status: Current Every Day Smoker    Packs/day: 0.25    Years: 52.00    Pack years: 13.00    Types: Cigarettes    Start date: 81  . Smokeless tobacco: Never Used  . Tobacco comment: Resumed smoking, after quit 01/2018  Vaping Use  . Vaping Use: Never used  Substance and Sexual Activity  . Alcohol use: Not Currently    Alcohol/week: 1.0 standard drink    Types: 1 Standard drinks or equivalent per week    Comment: occasionally drinks a margarita  . Drug use: No  . Sexual activity: Not on file  Other Topics Concern  . Not on file  Social History Narrative   Right handed    2-3 cups coffee per day         Social Determinants of Health   Financial Resource Strain: High Risk  . Difficulty of Paying Living Expenses: Hard  Food Insecurity: Food Insecurity Present  . Worried About Charity fundraiser in the Last Year: Often true  . Ran Out of Food in the Last Year: Often true  Transportation Needs: No Transportation Needs  . Lack of Transportation (Medical): No  . Lack of Transportation (Non-Medical): No  Physical Activity: Inactive  . Days of Exercise per Week: 0 days  . Minutes of Exercise per Session: 0 min  Stress: No Stress Concern Present  . Feeling of Stress : Not at all  Social Connections: Moderately Isolated  . Frequency of Communication with Friends and Family: More than three times a week  . Frequency of Social Gatherings with Friends and Family: More than three times a week  . Attends Religious Services: Never  . Active  Member of Clubs or Organizations: No  . Attends Archivist Meetings: Never  . Marital Status: Married   Family History  Problem Relation Age of Onset  . Stroke Mother   . Irregular heart beat Mother   . Heart murmur Brother   . Pulmonary embolism Brother   . Hypertension Other   . Pulmonary embolism Maternal Uncle    Allergies  Allergen Reactions  . Codeine Nausea Only  . Digoxin And Related     SOB, bad dreams  . Macrodantin [Nitrofurantoin Macrocrystal] Rash  . Morphine And Related Rash   Prior to Admission medications   Medication Sig Start Date End Date Taking? Authorizing Provider  HYDROcodone-acetaminophen (HYCET) 7.5-325 mg/15 ml solution Take 10-15 mLs by mouth every 6 (six) hours as needed for moderate pain. 03/05/21 03/05/22  Rozetta Nunnery, MD  Acetaminophen (TYLENOL 8 HOUR PO) Take by mouth every 8 (eight) hours. As needed    [provider]  busPIRone (BUSPAR) 5 MG tablet Take 1 tablet (5 mg total) by mouth 3 (three) times daily as needed (anxiety). 02/12/21   Karamalegos, Devonne Doughty, DO  cephALEXin (KEFLEX) 500 MG capsule Take 1 capsule (500 mg total) by mouth 3 (three) times daily. 03/04/21   Rozetta Nunnery, MD  furosemide (LASIX) 40 MG tablet Take 1 tablet (40 mg total) by mouth daily as needed (As needed for weight gain or shortness of breath). 02/09/21   Theora Gianotti, NP  Homeopathic Products (HOMEOPATHIC CALM SL) Place under the tongue. Is using Doterra essential oil of Onguard ingestion daily    [provider]  ipratropium (ATROVENT) 0.02 % nebulizer solution Take 2.5 mLs (0.5 mg total) by nebulization 4 (four) times daily. 02/20/21   Rozetta Nunnery, MD  ipratropium (ATROVENT) 0.06 % nasal spray Place 2 sprays into both nostrils 4 (four) times daily. As needed 02/06/21   [name], DO  mupirocin ointment (BACTROBAN) 2 % Apply 1 application topically 3 (three) times daily. 02/18/21   [provider]  QUEtiapine (SEROQUEL) 25 MG tablet TAKE 1 TABLET BY MOUTH EVERYDAY AT BEDTIME 03/11/21   [name], DO     Physical Exam: A portion of the skin graft is sticking out in excess in the posterior nasal cavity and partially obstructing the nose.  This portion was removed with scissors.  He has little bit of scarring anteriorly.   Assessment: S/p excision of squamous cell carcinoma from the left nostril and left septum.  Plan: Recommend use of saline irrigation and application of mupirocin ointment daily to the nostril.  He will follow-up in 2 weeks for recheck and cleaning.   Radene Journey, MD

## 2021-03-31 NOTE — Telephone Encounter (Signed)
Please reschedule F/U  with RN CM at [facility]

## 2021-03-31 NOTE — Telephone Encounter (Signed)
Patient scheduled for 05/04/2021

## 2021-04-06 DIAGNOSIS — I739 Peripheral vascular disease, unspecified: Secondary | ICD-10-CM | POA: Diagnosis not present

## 2021-04-06 DIAGNOSIS — I480 Paroxysmal atrial fibrillation: Secondary | ICD-10-CM | POA: Diagnosis not present

## 2021-04-06 DIAGNOSIS — Z8669 Personal history of other diseases of the nervous system and sense organs: Secondary | ICD-10-CM | POA: Diagnosis not present

## 2021-04-06 DIAGNOSIS — I25118 Atherosclerotic heart disease of native coronary artery with other forms of angina pectoris: Secondary | ICD-10-CM | POA: Diagnosis not present

## 2021-04-06 DIAGNOSIS — I495 Sick sinus syndrome: Secondary | ICD-10-CM | POA: Diagnosis not present

## 2021-04-06 DIAGNOSIS — D692 Other nonthrombocytopenic purpura: Secondary | ICD-10-CM | POA: Diagnosis not present

## 2021-04-06 DIAGNOSIS — I509 Heart failure, unspecified: Secondary | ICD-10-CM | POA: Diagnosis not present

## 2021-04-06 DIAGNOSIS — H547 Unspecified visual loss: Secondary | ICD-10-CM | POA: Diagnosis not present

## 2021-04-06 DIAGNOSIS — F331 Major depressive disorder, recurrent, moderate: Secondary | ICD-10-CM | POA: Diagnosis not present

## 2021-04-06 DIAGNOSIS — E261 Secondary hyperaldosteronism: Secondary | ICD-10-CM | POA: Diagnosis not present

## 2021-04-06 DIAGNOSIS — J449 Chronic obstructive pulmonary disease, unspecified: Secondary | ICD-10-CM | POA: Diagnosis not present

## 2021-04-06 DIAGNOSIS — I69398 Other sequelae of cerebral infarction: Secondary | ICD-10-CM | POA: Diagnosis not present

## 2021-04-07 ENCOUNTER — Encounter: Payer: Self-pay | Admitting: *Deleted

## 2021-04-07 ENCOUNTER — Telehealth: Payer: Self-pay | Admitting: *Deleted

## 2021-04-07 DIAGNOSIS — R918 Other nonspecific abnormal finding of lung field: Secondary | ICD-10-CM

## 2021-04-07 DIAGNOSIS — Z87891 Personal history of nicotine dependence: Secondary | ICD-10-CM

## 2021-04-07 NOTE — Telephone Encounter (Signed)
Spoke to patient via telephone re: 6 month f/u lung screening scan. Confirmed smoking history, current smoker, .5 ppd x 60 yrs. Scan scheduled for 04/29/21 @ 11:15 am.

## 2021-04-09 ENCOUNTER — Other Ambulatory Visit: Payer: Self-pay

## 2021-04-09 ENCOUNTER — Ambulatory Visit (INDEPENDENT_AMBULATORY_CARE_PROVIDER_SITE_OTHER): Payer: Medicare HMO | Admitting: Otolaryngology

## 2021-04-09 DIAGNOSIS — Z4889 Encounter for other specified surgical aftercare: Secondary | ICD-10-CM

## 2021-04-09 NOTE — Progress Notes (Signed)
HPI: Michael Delacruz is a 80 y.o. male who presents 3 weeks days s/p excision of left nasal septal squamous cell carcinoma.  He is doing better..   Past Medical History:  Diagnosis Date  . Anxiety   . Aortic dilatation ([place])    a. 11/2018 Echo: Ao root 34mm, Asc Ao 88mm.  . Arthritis   . Asthma   . Bradycardia   . CAD in native artery    a. LHC 09/2015: 40% pCx, 35% mRCA.  [name] CHF (congestive heart failure) ([place])   . COPD (chronic obstructive pulmonary disease) ([place])   . Dilation of intestine 01/2015  . Fibromyalgia   . Frequent headaches   . GERD (gastroesophageal reflux disease)   . History of blood clots    eye   . History of hiatal hernia   . History of kidney stones   . History of rheumatic fever   . Hypertension   . NICM (nonischemic cardiomyopathy) ([place])    a. 07/2017 Echo: EF reduced to 40-45%; b. 07/2018 Echo: EF 25-30%; c. 11/2018 Echo: EF 30-35%, diff HK w/ sev inf HK, Ao root 22mm, Asc Ao 7mm, Mild MS (grad 48mmHg), mildly dil LA.  [name] PAF (paroxysmal atrial fibrillation) ([place])    a. s/p TEE/DCCV 07/2015. b. H/o bleeding on Coumadin when INR >5, changed to Eliquis-subsequently discontinued; c. 04/2018 s/p SJM 3562 Quadra Allure MP DC PPM & AVN ablation.  . S/P Minimally invasive maze operation for atrial fibrillation 10/22/2015   Complete bilateral atrial lesion set using cryothermy and bipolar radiofrequency ablation with clipping of LA appendage via right mini thoracotomy approach  . S/P minimally invasive mitral valve repair 10/22/2015   Complex valvuloplasty including triangular resection of posterior leaflet, artificial Gore-tex neochord placement x6 and 38 mm Sorin Memo 3D Rechord ring annuloplasty via right minithoracotomy approach  . Severe mitral regurgitation s/p MVR    a. s/p MV repair 10/2015; b. 11/2018 Echo: Mild MS (mean grad 59mmHg).  . Sleep apnea    no longer uses cpap and doesn't use O2 at home  . Stroke [facility])     no vision in right, on occasion sees a  pinpoint   . Thyroid disorder   . TIA (transient ischemic attack)   . Tobacco abuse    Past Surgical History:  Procedure Laterality Date  . ANKLE SURGERY    . AV NODE ABLATION N/A 05/04/2018   Procedure: AV NODE ABLATION;  Surgeon: Thompson Grayer, MD;  Location: [place] [facility];  Service: Cardiovascular;  Laterality: N/A;  . BIV PACEMAKER INSERTION CRT-P N/A 05/03/2018   Procedure: BIV PACEMAKER INSERTION CRT-P;  Surgeon: Deboraha Sprang, MD;  Location: [place] [facility];  Service: Cardiovascular;  Laterality: N/A;  . CARDIAC CATHETERIZATION N/A 10/03/2015   Procedure: Right and Left Heart Cath and Coronary Angiography;  Surgeon: [name], MD;  Location: [place] [facility];  Service: Cardiovascular;  Laterality: N/A;  . COLONOSCOPY    . ELECTROPHYSIOLOGIC STUDY N/A 08/18/2015   Procedure: CARDIOVERSION;  Surgeon: Wellington Hampshire, MD;  Location: [facility] ORS;  Service: Cardiovascular;  Laterality: N/A;  . MASS EXCISION N/A 03/04/2021   Procedure: EXCISION INNER NASAL CANCER;  Surgeon: Rozetta Nunnery, MD;  Location: [facility] OR;  Service: ENT;  Laterality: N/A;  . MINIMALLY INVASIVE MAZE PROCEDURE N/A 10/22/2015   Procedure: MINIMALLY INVASIVE MAZE PROCEDURE;  Surgeon: Rexene Alberts, MD;  Location: [place];  Service: Open Heart Surgery;  Laterality: N/A;  . MITRAL  VALVE REPAIR Right 10/22/2015   Procedure: MINIMALLY INVASIVE MITRAL VALVE REPAIR (MVR);  Surgeon: Rexene Alberts, MD;  Location: [place];  Service: Open Heart Surgery;  Laterality: Right;  . SINUS EXPLORATION    . SKIN FULL THICKNESS GRAFT N/A 03/04/2021   Procedure: SKIN GRAFT FULL THICKNESS;  Surgeon: Rozetta Nunnery, MD;  Location: [place];  Service: ENT;  Laterality: N/A;  . TEE WITHOUT CARDIOVERSION N/A 08/18/2015   Procedure: TRANSESOPHAGEAL ECHOCARDIOGRAM (TEE);  Surgeon: Wellington Hampshire, MD;  Location: [facility] OR[facility];  Service: Cardiovascular;  Laterality: N/A;  . TEE WITHOUT CARDIOVERSION N/A 10/22/2015   Procedure:  TRANSESOPHAGEAL ECHOCARDIOGRAM (TEE);  Surgeon: Rexene Alberts, MD;  Location: [place];  Service: Open Heart Surgery;  Laterality: N/A;   Social History   Socioeconomic History  . Marital status: Married    Spouse name: [name]  . Number of children: 3  . Years of education: GED  . Highest education level: GED or equivalent  Occupational History  . Not on file  Tobacco Use  . Smoking status: Current Every Day Smoker    Packs/day: 0.50    Years: 60.00    Pack years: 30.00    Types: Cigarettes    Start date: 80  . Smokeless tobacco: Never Used  . Tobacco comment: Resumed smoking, after quit 01/2018  Vaping Use  . Vaping Use: Never used  Substance and Sexual Activity  . Alcohol use: Not Currently    Alcohol/week: 1.0 standard drink    Types: 1 Standard drinks or equivalent per week    Comment: occasionally drinks a margarita  . Drug use: No  . Sexual activity: Not on file  Other Topics Concern  . Not on file  Social History Narrative   Right handed    2-3 cups coffee per day         Social Determinants of Health   Financial Resource Strain: High Risk  . Difficulty of Paying Living Expenses: Hard  Food Insecurity: Food Insecurity Present  . Worried About Charity fundraiser in the Last Year: Often true  . Ran Out of Food in the Last Year: Often true  Transportation Needs: No Transportation Needs  . Lack of Transportation (Medical): No  . Lack of Transportation (Non-Medical): No  Physical Activity: Inactive  . Days of Exercise per Week: 0 days  . Minutes of Exercise per Session: 0 min  Stress: No Stress Concern Present  . Feeling of Stress : Not at all  Social Connections: Moderately Isolated  . Frequency of Communication with Friends and Family: More than three times a week  . Frequency of Social Gatherings with Friends and Family: More than three times a week  . Attends Religious Services: Never  . Active Member of Clubs or Organizations: No  . Attends Theatre manager Meetings: Never  . Marital Status: Married   Family History  Problem Relation Age of Onset  . Stroke Mother   . Irregular heart beat Mother   . Heart murmur Brother   . Pulmonary embolism Brother   . Hypertension Other   . Pulmonary embolism Maternal Uncle    Allergies  Allergen Reactions  . Codeine Nausea Only  . Digoxin And Related     SOB, bad dreams  . Macrodantin [Nitrofurantoin Macrocrystal] Rash  . Morphine And Related Rash   Prior to Admission medications   Medication Sig Start Date End Date Taking? Authorizing Provider  HYDROcodone-acetaminophen (HYCET) 7.5-325 mg/15 ml solution Take 10-15  mLs by mouth every 6 (six) hours as needed for moderate pain. 03/05/21 03/05/22  Rozetta Nunnery, MD  Acetaminophen (TYLENOL 8 HOUR PO) Take by mouth every 8 (eight) hours. As needed    [provider]  busPIRone (BUSPAR) 5 MG tablet Take 1 tablet (5 mg total) by mouth 3 (three) times daily as needed (anxiety). 02/12/21   Karamalegos, Devonne Doughty, DO  cephALEXin (KEFLEX) 500 MG capsule Take 1 capsule (500 mg total) by mouth 3 (three) times daily. 03/04/21   Rozetta Nunnery, MD  furosemide (LASIX) 40 MG tablet Take 1 tablet (40 mg total) by mouth daily as needed (As needed for weight gain or shortness of breath). 02/09/21   Theora Gianotti, NP  Homeopathic Products (HOMEOPATHIC CALM SL) Place under the tongue. Is using Doterra essential oil of Onguard ingestion daily    [provider]  ipratropium (ATROVENT) 0.02 % nebulizer solution Take 2.5 mLs (0.5 mg total) by nebulization 4 (four) times daily. 02/20/21   Rozetta Nunnery, MD  ipratropium (ATROVENT) 0.06 % nasal spray Place 2 sprays into both nostrils 4 (four) times daily. As needed 02/06/21   [name], DO  mupirocin ointment (BACTROBAN) 2 % Apply 1 application topically 3 (three) times daily. 02/18/21   [provider]  QUEtiapine (SEROQUEL) 25 MG tablet TAKE 1  TABLET BY MOUTH EVERYDAY AT BEDTIME 03/11/21   [name], DO     Physical Exam: Still has moderate scabbing where he had a graft placed.  But everything seems to be healing nicely with no signs of infection.   Assessment: S/p excision of squamous cell carcinoma from the left nostril and left septum.  Plan: Recommend application of antibiotic ointment daily to the incision site and he will follow-up in 1 month for recheck and cleaning.   Radene Journey, MD

## 2021-04-13 ENCOUNTER — Ambulatory Visit (INDEPENDENT_AMBULATORY_CARE_PROVIDER_SITE_OTHER): Payer: Medicare HMO

## 2021-04-13 DIAGNOSIS — I5020 Unspecified systolic (congestive) heart failure: Secondary | ICD-10-CM

## 2021-04-13 DIAGNOSIS — Z950 Presence of cardiac pacemaker: Secondary | ICD-10-CM

## 2021-04-14 NOTE — Progress Notes (Signed)
EPIC Encounter for ICM Monitoring  Patient Name: Michael Delacruz is a 80 y.o. male Date: 04/14/2021 Primary Care Physican: [name], DO Primary Cardiologist:Gollan Electrophysiologist:Klein Bi-V Pacing:94% 04/14/2021 Weight: 178lbs    Spoke with patient and reports feeling a little [name] of breath over the last couple of days.    CorVue thoracic impedancenormal.  Prescribed:  Furosemide40 mgTake 1 tablet (40 mg total) by mouth daily as needed (As needed for weight gain or shortness of breath).  Recommendations:Advised to take PRN Furosemide x 1-2 days   Follow-up plan: ICM clinic phone appointment on 05/25/2021. 91 day device clinic remote transmission 04/28/2021.   EP/Cardiology Office Visits:05/11/2021 with Dr.Gollan.   Copy of ICM check sent to [name].  3 month ICM trend: 04/13/2021.    1 Year ICM trend:       [name], RN 04/14/2021 2:18 PM

## 2021-04-28 ENCOUNTER — Ambulatory Visit (INDEPENDENT_AMBULATORY_CARE_PROVIDER_SITE_OTHER): Payer: Medicare HMO

## 2021-04-28 DIAGNOSIS — I442 Atrioventricular block, complete: Secondary | ICD-10-CM | POA: Diagnosis not present

## 2021-04-29 ENCOUNTER — Ambulatory Visit
Admission: RE | Admit: 2021-04-29 | Discharge: 2021-04-29 | Disposition: A | Discharge status: Home / Self Care | Payer: Medicare HMO | Source: Ambulatory Visit | Attending: Oncology | Admitting: Oncology

## 2021-04-29 ENCOUNTER — Other Ambulatory Visit: Payer: Self-pay

## 2021-04-29 DIAGNOSIS — Z87891 Personal history of nicotine dependence: Secondary | ICD-10-CM | POA: Diagnosis not present

## 2021-04-29 DIAGNOSIS — J432 Centrilobular emphysema: Secondary | ICD-10-CM | POA: Diagnosis not present

## 2021-04-29 DIAGNOSIS — J984 Other disorders of lung: Secondary | ICD-10-CM | POA: Diagnosis not present

## 2021-04-29 DIAGNOSIS — R918 Other nonspecific abnormal finding of lung field: Secondary | ICD-10-CM

## 2021-04-29 DIAGNOSIS — I712 Thoracic aortic aneurysm, without rupture: Secondary | ICD-10-CM | POA: Diagnosis not present

## 2021-04-29 DIAGNOSIS — I2510 Atherosclerotic heart disease of native coronary artery without angina pectoris: Secondary | ICD-10-CM | POA: Diagnosis not present

## 2021-04-29 LAB — CUP PACEART REMOTE DEVICE CHECK
Battery Remaining Longevity: 89 mo
Battery Remaining Percentage: 95.5 %
Battery Voltage: 2.99 V
Date Time Interrogation Session: 20220511021329
Implantable Lead Implant Date: 20190515
Implantable Lead Implant Date: 20190515
Implantable Lead Location: 753858
Implantable Lead Location: 753860
Implantable Lead Model: 5076
Implantable Pulse Generator Implant Date: 20190515
Lead Channel Impedance Value: 510 Ohm
Lead Channel Impedance Value: 540 Ohm
Lead Channel Pacing Threshold Amplitude: 0.5 V
Lead Channel Pacing Threshold Amplitude: 1 V
Lead Channel Pacing Threshold Pulse Width: 0.5 ms
Lead Channel Pacing Threshold Pulse Width: 0.5 ms
Lead Channel Sensing Intrinsic Amplitude: 12 mV
Lead Channel Setting Pacing Amplitude: 2 V
Lead Channel Setting Pacing Amplitude: 2.5 V
Lead Channel Setting Pacing Pulse Width: 0.5 ms
Lead Channel Setting Pacing Pulse Width: 0.5 ms
Lead Channel Setting Sensing Sensitivity: 2 mV
Pulse Gen Model: 3562
Pulse Gen Serial Number: 9427031

## 2021-05-04 ENCOUNTER — Telehealth: Payer: Medicare HMO | Admitting: General Practice

## 2021-05-04 ENCOUNTER — Ambulatory Visit (INDEPENDENT_AMBULATORY_CARE_PROVIDER_SITE_OTHER): Payer: Medicare HMO | Admitting: General Practice

## 2021-05-04 DIAGNOSIS — I6930 Unspecified sequelae of cerebral infarction: Secondary | ICD-10-CM

## 2021-05-04 DIAGNOSIS — F331 Major depressive disorder, recurrent, moderate: Secondary | ICD-10-CM

## 2021-05-04 DIAGNOSIS — J3489 Other specified disorders of nose and nasal sinuses: Secondary | ICD-10-CM

## 2021-05-04 DIAGNOSIS — I5032 Chronic diastolic (congestive) heart failure: Secondary | ICD-10-CM

## 2021-05-04 NOTE — Patient Instructions (Signed)
Visit Information  PATIENT GOALS: Goals Addressed            This Visit's Progress   . RNCM: Keep or Improve My Strength-Stroke       Timeframe:  Long-Range Goal Priority:  Medium Start Date:                             Expected End Date:                       Follow Up Date 07-13-2021   - eat healthy to increase strength - increase activity or exercise time a little every week - know who to call for help if I fall - learn how to get up if I fall - plan activity for times when energy is the highest    Why is this important?    Before the stroke you probably did not think much about being safe when you are up and about.   Now, it may be harder for you to get around.   It may also be easier for you to trip or fall.   It is common to have muscle weakness after a stroke. You may also feel like you cannot control an arm or leg.   It will be helpful to work with a physical therapist to get your strength and muscle control back.   It is good to stay as active as you can. Walking and stretching help you stay strong and flexible.   The physical therapist will develop an exercise program just for you.     Notes: Is concerned about not being able to walk in the near future     . RNCM: Manage Fatigue (Stroke)       Timeframe:  Long-Range Goal Priority:  Medium Start Date:                             Expected End Date:                       Follow Up Date 07-13-2021   - balance periods of rest with activity as needed - develop a new routine to improve sleep - eat healthy - get at least 7 to 8 hours of sleep at night - get outdoors every day (weather permitting) - limit daytime naps - practice relaxation or meditation daily - use a fan or white noise in bedroom - use devices that will help like a cane, sock-puller or reacher    Why is this important?    Many people have problems with extreme tiredness after a stroke. This is called poststroke fatigue.   Poststroke  fatigue is not the same as the usual tiredness.   It does not always improve with rest.   It is not related to how busy or active you have been.   Many things can help you to manage it.        . COMPLETED: RNCM: Track and Manage Fluids and Swelling-Heart Failure       Timeframe:  Long-Range Goal Priority:  High Start Date:         01-28-2021                    Expected End Date:     03-12-2021  Follow Up Date: 07-13-2021 or before   - call office if I gain more than 2 pounds in one day or 5 pounds in one week - keep legs up while sitting - use salt in moderation - watch for swelling in feet, ankles and legs every day - weigh myself daily    Why is this important?    It is important to check your weight daily and watch how much salt and liquids you have.   It will help you to manage your heart failure.    Notes: 01-28-2021: Patient is currently having an exacerbation in his condition with HF. Cardiologist instructed to double Lasix dose. 05-04-2021: The patient is stable at this time. No new concerns at this time     . RNCM: Track and Manage My Symptoms-Depression       Timeframe:  Long-Range Goal Priority:  High Start Date:                             Expected End Date:                       Follow Up Date 07-13-2021   - avoid negative self-talk - develop a personal safety plan - develop a plan to deal with triggers like holidays, anniversaries - exercise at least 2 to 3 times per week - have a plan for how to handle bad days - journal feelings and what helps to feel better or worse - spend time or talk with others at least 2 to 3 times per week - spend time or talk with others every day - watch for early signs of feeling worse - write in journal every day    Why is this important?    Keeping track of your progress will help your treatment team find the right mix of medicine and therapy for you.   Write in your journal every day.   Day-to-day changes in  depression symptoms are normal. It may be more helpful to check your progress at the end of each week instead of every day.     Notes: Discussed writing down goals to accomplish and taking small steps to meet those goals       Patient Care Plan: RNCM: Heart Failure (Adult)    Problem Identified: RNCM: Symptom Exacerbation (Heart Failure)   Priority: Medium    Long-Range Goal: RNCM: Symptom Exacerbation Prevented or Minimized (HF)   Priority: Medium  Note:   Current Barriers:  [name] Knowledge deficits related to basic heart failure pathophysiology and self care management . Unable to independently manage HF  . Unable to self administer medications as prescribed . Lacks social connections . Does not contact provider office for questions/concerns . Lack of scale in home- has scales, does not consistently use  . Financial strain  Occupational hygienist):   Over the next 120 days, patient will weigh self daily and record  Over the next 120 days, patient will verbalize understanding of Heart Failure Action Plan and when to call doctor  Over the next 120 days, patient will take all Heart Failure mediations as prescribed Interventions:  . Collaboration with [name], DO regarding development and update of comprehensive plan of care as evidenced by provider attestation and co-signature . Inter-disciplinary care team collaboration (see longitudinal plan of care) . Basic overview and discussion of pathophysiology of Heart Failure . Provided written and verbal education  on low sodium diet. 05-04-2021: Review and encouraged the patient to watch dietary restrictions  . Reviewed Heart Failure Action Plan in depth and provided written copy. 05-04-2021: The patient is doing well and has not had any further HF exacerbation since February 2022. The patient has plan of care when he has extra fluid on board. The patient is happy and doing well today.  . Assessed for scales in  home- does not consistently use  . Discussed importance of daily weight . Reviewed role of diuretics in prevention of fluid overload- 01-28-2021: The patient is currently taking extra dose of lasix per cardiology for exacerbation in his CHF.  05-04-2021: The patient is compliant with medications regimen.  . Collaboration with pcp, pharm D, and clinical staff for assistance with request for something to help with opening up sinus passage to be able to breath better. . Review of upcoming appointments. Sees cardiologist in June. Does not have any follow up with pcp but knows to call for changes.   Patient Goals/Self-Care Activities . Over the next 120 days, patient will:  - Take Heart Failure Medications as prescribed - Weigh daily and record (notify MD with 3 lb weight gain over night or 5 lb in a week) - Follow CHF Action Plan - Adhere to low sodium diet - barriers to lifestyle changes reviewed and addressed - barriers to treatment reviewed and addressed - cognitive screening completed and reviewed - depression screen reviewed - health literacy screening completed or reviewed - healthy lifestyle promoted - rescue (action) plan developed - rescue (action) plan reviewed - self-awareness of signs/symptoms of worsening disease encouraged Follow Up Plan: Telephone follow up appointment with care management team member scheduled for: 07-13-2021 at 11:45 am   Task: RNCM: Identify and Minimize Risk of Heart Failure Exacerbation   Note:   Care Management Activities:    - barriers to lifestyle changes reviewed and addressed - barriers to treatment reviewed and addressed - cognitive screening completed and reviewed - depression screen reviewed - health literacy screening completed or reviewed - healthy lifestyle promoted - rescue (action) plan developed - rescue (action) plan reviewed - self-awareness of signs/symptoms of worsening disease encouraged       Patient Care Plan: RNCM: Stroke  (Adult)    Problem Identified: RNCM: Emotional Adjustment to Disease (Stroke)   Priority: Medium    Long-Range Goal: RNCM: Optimal Coping   Priority: Medium  Note:   Current Barriers:  . Care Coordination needs related to help in the community and resources  in a patient with history of CVA . Chronic Disease Management support and education needs related to post CVA care  . Lacks caregiver support.  . Film/video editor.  . Non-adherence to scheduled provider appointments . Non-adherence to prescribed medication regimen . Unable to independently manage health post CVA . Unable to self administer medications as prescribed . Does not adhere to prescribed medication regimen . Lacks social connections . Unable to perform IADLs independently . Does not maintain contact with provider office . Does not contact provider office for questions/concerns  Nurse Case Manager Clinical Goal(s):  [name] Over the next 120 days, patient will verbalize understanding of plan for effective management of post stroke care . Over the next 120 days, patient will work with [facility], [facility] team, and pcp to address needs related to management of chronic condtions  . Over the next 120 days, patient will demonstrate improved adherence to prescribed treatment plan for CVA as evidenced bycompliance with heart healthy diet,  taking medications as prescribed, and working with the CCM team to optimize health and well being.  . Over the next 120 days, patient will verbalize basic understanding of CVA disease process and self health management plan as evidenced by finding new hobbies to help the patient have pleasure and things to look forward to in his life  Interventions:  . 1:1 collaboration with [name], DO regarding development and update of comprehensive plan of care as evidenced by provider attestation and co-signature . Inter-disciplinary care team collaboration (see longitudinal plan of care) . Evaluation  of current treatment plan related to post stroke and patient's adherence to plan as established by provider. . Advised patient to set small goals to work toward. The patient has a lot of depression and anxiety over the loss of ability to do things he use to do because of CVA. Also has lost vision in his right eye and he can no longer read and enjoy reading. Also can not shoot pool like he did before have CVA.  The patient feels lost and states he is "living in the past". . Provided education to patient re: alternate activities to enhance mood. Working on strengthen exercises. Options for help in mobility and safety. 05-04-2021: The patient is doing things with his little granddaughter and enjoys spending time with her. The patient denies any acute distress. Is pacing activity.  [name] Collaborated with pcp regarding patient states he still has the infection in his nose and would like to know about an ENT consult from pcp to either [facility] or [place], not the [place] area. Will sent and in basket message to pcp and inquire. 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 Sees the specialist for follow up again this week. The patient states it is healing.  . Discussed plans with patient for ongoing care management follow up and provided patient with direct contact information for care management team  Patient Goals/Self-Care Activities Over the next 120 days, patient will:  - Patient will self administer medications as prescribed Patient will attend all scheduled provider appointments Patient will call pharmacy for medication refills Patient will attend church or other social activities Patient will continue to perform IADL's independently Patient will call provider office for new concerns or questions Patient will work with BSW to address care coordination needs and will continue to work with the clinical team to address health care and disease management related needs.    - counseling  provided - decision-making supported - depression screen reviewed - goal-setting facilitated - positive reinforcement provided - problem-solving facilitated - relaxation techniques promoted - self-care encouraged - self-reflection promoted - verbalization of feelings encouraged Follow Up Plan: Telephone follow up appointment with care management team member scheduled for: 07-13-2021 at 1145 am       Task: RNCM: Support Psychosocial Response to Stroke   Note:   Care Management Activities:    - counseling provided - decision-making supported - depression screen reviewed - goal-setting facilitated - positive reinforcement provided - problem-solving facilitated - relaxation techniques promoted - self-care encouraged - self-reflection promoted - verbalization of feelings encouraged     Patient Care Plan: RNCM: Depression (Adult)    Problem Identified: RNCM: Depression Identification (Depression)   Priority: High    Goal: RNCM: Depressive Symptoms Identified   Priority: High  Note:   Current Barriers:  [name] Knowledge Deficits related to effective ways of decreasing anxiety and depression  . Care Coordination needs related to resources and support  in a  patient with depression as a result of loss of independence and unable to do the things he use to do like reading and shooting pool  . Chronic Disease Management support and education needs related to management of depression  . Lacks caregiver support.  . Film/video editor.  . Non-adherence to scheduled provider appointments . Non-adherence to prescribed medication regimen . Unable to independently manage depression and anxiety  . Does not adhere to prescribed medication regimen . Does not adhere to prescribed psychotropic medication regimen . Lacks social connections . Unable to perform IADLs independently . Does not maintain contact with provider office . Does not contact provider office for questions/concerns  Nurse  Case Manager Clinical Goal(s):  [name] Over the next 120 days, patient will verbalize understanding of plan for effective management of depression  . Over the next 120 days, patient will work with [facility], CCM team and pcp to address needs related to depression and patient stating his depression is "bad" . Over the next 120 days, patient will work with CM clinical social worker to assist with depression and anxiety support and education  . Over the next 120 days, the patient will demonstrate ongoing self health care management ability as evidenced by coping with chronic conditions and depression more effectively and finding new hobbies that he enjoys doing and can do independently *  Interventions:  . 1:1 collaboration with [name], DO regarding development and update of comprehensive plan of care as evidenced by provider attestation and co-signature . Inter-disciplinary care team collaboration (see longitudinal plan of care) . Evaluation of current treatment plan related to depression  and patient's adherence to plan as established by provider. . Advised patient to call the office for worsening mood, anxiety, depression. 01-28-2021: The patient is having an exacerbation in his HF at this time that is also causing increased anxiety and depression. Collaboration with pcp and CCM team. 02-12-2021: The patient reached out to the [facility] because he was very anxious and did not know what to do. The patient states that his mother in law passed away earlier today and his wife is not at home. His daughter is there with his grandchild but not directly with him. He has taken 2 Xanax that he had on hand but they have not helped him at all. He wants to know if the pcp can give him something to help calm him down because he feels like he is going "crazy". Secure chat to pcp asking or recommendations. The pcp has ordered Buspar 5 mg and he can take 2 to 3 times daily as needed for anxiety. Education and support given  to the patient. 05-04-2021: The patient states he is doing much better today. The patient denies any new concerns and is enjoying playing with his granddaughter. The patient states that everything is going well with his health and he is hopeful that the area in his nose will heal up soon. Denies any new concerns at this time.  . Provided education to patient re: alternate ways to deal with depression and anxiety, safety, concerns about depressed state. 02-12-2021: Review of divisional activities, relaxation, mindfulness . Reviewed medications with patient and discussed the patient does not take anything for depression or anxiety. Discussed medication could help with his depressed symptoms. The patient is not open to taking medications at this time to help with depression. 02-12-2021: Review of Buspar and recommendations by pcp. Education on the patient not taking Xanax as this is something that has not worked for  him in the past. The patient has some on hand and did try a couple to see if it would help but it did not. The patient contracted not to take anymore of the Xanax and agrees to take the Buspar. Review of having someone pick it up from the pharmacy for him. 05-04-2021: The patient is compliant with medications and denies any new questions or concerns. [name] Collaborated with pcp and LCSW  regarding increased depression and ideas or suggestions to help with effective management of depression. 02-12-2021: Will alert the LCSW as well of the changes in events today. The LCSW is working with the patient also. 05-04-2021: The LCSW is following the patient. Reminded the patient of upcoming appointment today.  . Provided patient with depression educational materials related to effective management and alternative therapies  . Social Work referral for depression help and recommendations  . Discussed plans with patient for ongoing care management follow up and provided patient with direct contact information for care  management team  Patient Goals/Self-Care Activities Over the next 120 days, patient will:  - Patient will self administer medications as prescribed Patient will attend all scheduled provider appointments Patient will call pharmacy for medication refills Patient will continue to perform IADL's independently Patient will call provider office for new concerns or questions Patient will work with BSW to address care coordination needs and will continue to work with the clinical team to address health care and disease management related needs.   - anxiety screen reviewed - depression screen reviewed - medication list reviewed - participation in psychiatric services encouraged  Follow Up Plan: Telephone follow up appointment with care management team member scheduled for:07-13-2021 at 11:45 am.       Task: RNCM Identify Depressive Symptoms and Facilitate Treatment   Note:   Care Management Activities:    - anxiety screen reviewed - depression screen reviewed - medication list reviewed - participation in psychiatric services encouraged       Patient Care Plan: General Social Work (Adult)    Problem Identified: Quality of Life (General Plan of Care)     Long-Range Goal: Quality of Life Maintained   Start Date: 02/10/2021  Note:    Timeframe:  Long-Range Goal Priority:  Medium  Start Date:  02/10/21                         Expected End Date:  05/10/21                    Follow Up Date- 05/06/21  Current Barriers:  . Financial constraints related to managing health care . Housing barriers . ADL IADL limitations . Social Isolation  Clinical Social Work Clinical Goal(s):  [name] Over the next 90 days, client will work with SW to address concerns related to knowledge deficit of appropriate self-care and depression management tools to implement into his daily routine to promote healthy living   Interventions: . Patient interviewed and appropriate assessments performed . Patient had a  recent hospital admission on 01/30/21 for Altered Mental Status. CCM LCSW spoke with patient's spouse on 03/24/21 and she confirms that [facility] PT is involved and they are coming out 2x per week. [facility] is still coming once per month to complete home visits as well. Per spouse, patient continues to struggle with weakness in the legs but has not had any recent falls.  . Patient went to his cardiologist appointment and ENT appointment. Spouse continues to provide stable  transportation to all medical appointments for patient.  . Per spouse, patient's appetite remains low. Patient reports that he has NO taste. CCM LCSW provided education on what healthy self-care looks like and how to improve his eating habits. Patient is actively drinking ensure. . Discussed plans with patient for ongoing care management follow up and provided patient with direct contact information for care management team . Advised patient to consider grief therapy due to the loss he has experienced this year. Patient's mother in law passed away recently. Patient declined referral for telephonic grief counseling but was appreciative of education on resource.  . Patient's spouse was informed that current CCM LCSW will be leaving position next month and his next CCM Social Work follow up visit will be with another LCSW. Patient was appreciative of support provided and receptive to news . Patient ask that I send him a text message of his upcoming appointments. Text messages sent on 02/18/21.  . Patient was diagnosed with squamous cell carcinoma and completed an excision inner nasal cancer procedure surgery for 02/24/21. Patient has a follow up appointment regarding this on 03/26/21. Spouse reports that patient has been very worried about this. Emotional support provided to family.  . Assisted patient/caregiver with obtaining information about health plan benefits . Provided education on available community resources within the local area that patient may  benefit from. . Provided mental health counseling with regard to his daily difficulty with mobility/weakness/balance. Patient has frequent falls and does not have a medical alert system. LCSW used active and reflective listening and implemented appropriate interventions to help suppport patient and his emotional needs. Advised patient/family to implement deep breathing/grounding/meditation/self-care exercises into his daily routine to combat stressors. Education provided.  . Past update-Patient is 3 months late on rent and is experiencing food insecurity. Patient reports that his financial difficulties have increased his stress, anxiety and depression. C3 Guide previously worked with patient and completed [employer] application for rent assistance. Patient is now interested in getting food stamps are family is unable to afford groceries. CCM LCSW placed new [employer]3 referral for EBT enrollment assistance on 02/18/21  Patient Self Care Activities:  . Attends all scheduled provider appointments . Calls provider office for new concerns or questions  Please see past updates related to this goal by clicking on the "Past Updates" button in the selected goal     Task: Support and Maintain Acceptable Degree of Health, Comfort and Happiness   Note:   Care Management Activities:    - affirmation provided - community involvement promoted - expression of thoughts about present/future encouraged - independence in all possible areas promoted - life review by storytelling encouraged - patient strengths promoted - psychosocial concerns monitored - self-expression encouraged - sleep diary encouraged - sleep hygiene techniques encouraged - social relationships promoted - strategies to maintain hearing and/or vision promoted - strategies to maintain intimacy promoted - wellness behaviors promoted    Notes:      Patient verbalizes understanding of instructions provided today and agrees to view in [place].    Telephone follow up appointment with care management team member scheduled for: 07-13-2021 at 1145 am  [place], MSN, [place] Mobile: [phone]

## 2021-05-04 NOTE — Chronic Care Management (AMB) (Signed)
Chronic Care Management   CCM RN Visit Note  05/04/2021 Name: Michael Delacruz MRN: [id number] DOB: 1941/08/28  Subjective: Michael Delacruz is a 80 y.o. year old male who is a primary care patient of [name], DO. The care management team was consulted for assistance with disease management and care coordination needs.    Engaged with patient by telephone for follow up visit in response to provider referral for case management and/or care coordination services.   Consent to Services:  The patient was given information about Chronic Care Management services, agreed to services, and gave verbal consent prior to initiation of services.  Please see initial visit note for detailed documentation.   Patient agreed to services and verbal consent obtained.   Assessment: Review of patient past medical history, allergies, medications, health status, including review of consultants reports, laboratory and other test data, was performed as part of comprehensive evaluation and provision of chronic care management services.   SDOH (Social Determinants of Health) assessments and interventions performed:    CCM Care Plan  Allergies  Allergen Reactions  . Codeine Nausea Only  . Digoxin And Related     SOB, bad dreams  . Macrodantin [Nitrofurantoin Macrocrystal] Rash  . Morphine And Related Rash    Outpatient Encounter Medications as of 05/04/2021  Medication Sig Note  . HYDROcodone-acetaminophen (HYCET) 7.5-325 mg/15 ml solution Take 10-15 mLs by mouth every 6 (six) hours as needed for moderate pain.   . Acetaminophen (TYLENOL 8 HOUR PO) Take by mouth every 8 (eight) hours. As needed   . busPIRone (BUSPAR) 5 MG tablet Take 1 tablet (5 mg total) by mouth 3 (three) times daily as needed (anxiety). 03/03/2021: Pt states he may take 1 daily if needed  . cephALEXin (KEFLEX) 500 MG capsule Take 1 capsule (500 mg total) by mouth 3 (three) times daily.   . furosemide (LASIX) 40 MG tablet  Take 1 tablet (40 mg total) by mouth daily as needed (As needed for weight gain or shortness of breath). 03/03/2021: Takes daily   . Homeopathic Products (HOMEOPATHIC CALM SL) Place under the tongue. Is using Doterra essential oil of Onguard ingestion daily   . ipratropium (ATROVENT) 0.02 % nebulizer solution Take 2.5 mLs (0.5 mg total) by nebulization 4 (four) times daily. 03/03/2021: No longer using this medication  . ipratropium (ATROVENT) 0.06 % nasal spray Place 2 sprays into both nostrils 4 (four) times daily. As needed   . mupirocin ointment (BACTROBAN) 2 % Apply 1 application topically 3 (three) times daily. 03/03/2021: No longer using this medication  . QUEtiapine (SEROQUEL) 25 MG tablet TAKE 1 TABLET BY MOUTH EVERYDAY AT BEDTIME    No facility-administered encounter medications on file as of 05/04/2021.    Patient Active Problem List   Diagnosis Date Noted  . Failure to thrive in adult 02/01/2021  . Altered mental status 01/31/2021  . CHB (complete heart block) ([place]) 01/31/2021  . Encephalopathy 01/31/2021  . Generalized anxiety disorder 05/04/2018  . OSA (obstructive sleep apnea) 05/04/2018  . History of stroke 05/04/2018  . Retinal hemorrhage, left eye 05/04/2018  . Late effects of cerebral ischemic stroke 05/04/2018  . Hypothyroidism 05/04/2018  . Permanent atrial fibrillation ([place]) 05/02/2018  . Tachycardia induced cardiomyopathy ([place]) 05/02/2018  . Hypokalemia 05/02/2018  . Constipation 04/25/2018  . Insomnia due to medical condition 02/22/2018  . Depression, major, recurrent, moderate ([place]) 01/16/2018  . Somatic symptom disorder 01/16/2018  . Vision loss 12/31/2015  . TIA (transient ischemic  attack) 12/06/2015  . S/P MVR (mitral valve repair) 11/11/2015  . S/P Minimally invasive maze operation for atrial fibrillation 10/22/2015  . Systolic CHF, chronic ([place])   . Atrial fibrillation with RVR ([place])   . Centrilobular emphysema ([place])   . Hypotension 11/02/2012  . Stress  at home 11/02/2012  . Smoking 04/27/2012  . Hyperlipidemia 04/27/2012  . CAD (coronary artery disease) 04/27/2012  . Heart palpitations 09/03/2011  . Dizziness 05/19/2011  . MVP (mitral valve prolapse) 05/05/2011  . Mitral valve regurgitation 05/05/2011  . Pulmonary HTN ([place]) 05/05/2011    Conditions to be addressed/monitored:CHF, Depression and History of a stroke  Care Plan : RNCM: Heart Failure (Adult)  Updates made by Vanita Ingles since 05/04/2021 12:00 AM    Problem: RNCM: Symptom Exacerbation (Heart Failure)   Priority: Medium    Long-Range Goal: RNCM: Symptom Exacerbation Prevented or Minimized (HF)   Priority: Medium  Note:   Current Barriers:  [name] Knowledge deficits related to basic heart failure pathophysiology and self care management . Unable to independently manage HF  . Unable to self administer medications as prescribed . Lacks social connections . Does not contact provider office for questions/concerns . Lack of scale in home- has scales, does not consistently use  . Financial strain  Occupational hygienist):   Over the next 120 days, patient will weigh self daily and record  Over the next 120 days, patient will verbalize understanding of Heart Failure Action Plan and when to call doctor  Over the next 120 days, patient will take all Heart Failure mediations as prescribed Interventions:  . Collaboration with [name], DO regarding development and update of comprehensive plan of care as evidenced by provider attestation and co-signature . Inter-disciplinary care team collaboration (see longitudinal plan of care) . Basic overview and discussion of pathophysiology of Heart Failure . Provided written and verbal education on low sodium diet. 05-04-2021: Review and encouraged the patient to watch dietary restrictions  . Reviewed Heart Failure Action Plan in depth and provided written copy. 05-04-2021: The patient is doing well and has not  had any further HF exacerbation since February 2022. The patient has plan of care when he has extra fluid on board. The patient is happy and doing well today.  . Assessed for scales in home- does not consistently use  . Discussed importance of daily weight . Reviewed role of diuretics in prevention of fluid overload- 01-28-2021: The patient is currently taking extra dose of lasix per cardiology for exacerbation in his CHF.  05-04-2021: The patient is compliant with medications regimen.  . Collaboration with pcp, pharm D, and clinical staff for assistance with request for something to help with opening up sinus passage to be able to breath better. . Review of upcoming appointments. Sees cardiologist in June. Does not have any follow up with pcp but knows to call for changes.   Patient Goals/Self-Care Activities . Over the next 120 days, patient will:  - Take Heart Failure Medications as prescribed - Weigh daily and record (notify MD with 3 lb weight gain over night or 5 lb in a week) - Follow CHF Action Plan - Adhere to low sodium diet - barriers to lifestyle changes reviewed and addressed - barriers to treatment reviewed and addressed - cognitive screening completed and reviewed - depression screen reviewed - health literacy screening completed or reviewed - healthy lifestyle promoted - rescue (action) plan developed - rescue (action) plan reviewed - self-awareness of signs/symptoms  of worsening disease encouraged Follow Up Plan: Telephone follow up appointment with care management team member scheduled for: 07-13-2021 at 11:45 am   Care Plan : RNCM: Stroke (Adult)  Updates made by Vanita Ingles since 05/04/2021 12:00 AM    Problem: RNCM: Emotional Adjustment to Disease (Stroke)   Priority: Medium    Long-Range Goal: RNCM: Optimal Coping   Priority: Medium  Note:   Current Barriers:  . Care Coordination needs related to help in the community and resources  in a patient with history of  CVA . Chronic Disease Management support and education needs related to post CVA care  . Lacks caregiver support.  . Film/video editor.  . Non-adherence to scheduled provider appointments . Non-adherence to prescribed medication regimen . Unable to independently manage health post CVA . Unable to self administer medications as prescribed . Does not adhere to prescribed medication regimen . Lacks social connections . Unable to perform IADLs independently . Does not maintain contact with provider office . Does not contact provider office for questions/concerns  Nurse Case Manager Clinical Goal(s):  [name] Over the next 120 days, patient will verbalize understanding of plan for effective management of post stroke care . Over the next 120 days, patient will work with [facility], [facility], and pcp to address needs related to management of chronic condtions  . Over the next 120 days, patient will demonstrate improved adherence to prescribed treatment plan for CVA as evidenced bycompliance with heart healthy diet, taking medications as prescribed, and working with the CCM team to optimize health and well being.  . Over the next 120 days, patient will verbalize basic understanding of CVA disease process and self health management plan as evidenced by finding new hobbies to help the patient have pleasure and things to look forward to in his life  Interventions:  . 1:1 collaboration with [name], DO regarding development and update of comprehensive plan of care as evidenced by provider attestation and co-signature . Inter-disciplinary care team collaboration (see longitudinal plan of care) . Evaluation of current treatment plan related to post stroke and patient's adherence to plan as established by provider. . Advised patient to set small goals to work toward. The patient has a lot of depression and anxiety over the loss of ability to do things he use to do because of CVA. Also has lost  vision in his right eye and he can no longer read and enjoy reading. Also can not shoot pool like he did before have CVA.  The patient feels lost and states he is "living in the past". . Provided education to patient re: alternate activities to enhance mood. Working on strengthen exercises. Options for help in mobility and safety. 05-04-2021: The patient is doing things with his little granddaughter and enjoys spending time with her. The patient denies any acute distress. Is pacing activity.  [name] Collaborated with pcp regarding patient states he still has the infection in his nose and would like to know about an ENT consult from pcp to either [facility] or [place], not the [place] area. Will sent and in basket message to pcp and inquire. 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 Sees the specialist for follow up again this week. The patient states it is healing.  . Discussed plans with patient for ongoing care management follow up and provided patient with direct contact information for care management team  Patient Goals/Self-Care Activities Over the next 120 days, patient will:  - Patient will  self administer medications as prescribed Patient will attend all scheduled provider appointments Patient will call pharmacy for medication refills Patient will attend church or other social activities Patient will continue to perform IADL's independently Patient will call provider office for new concerns or questions Patient will work with BSW to address care coordination needs and will continue to work with the clinical team to address health care and disease management related needs.    - counseling provided - decision-making supported - depression screen reviewed - goal-setting facilitated - positive reinforcement provided - problem-solving facilitated - relaxation techniques promoted - self-care encouraged - self-reflection promoted - verbalization of feelings encouraged Follow  Up Plan: Telephone follow up appointment with care management team member scheduled for: 07-13-2021 at 1145 am       Care Plan : RNCM: Depression (Adult)  Updates made by Vanita Ingles since 05/04/2021 12:00 AM    Problem: RNCM: Depression Identification (Depression)   Priority: High    Goal: RNCM: Depressive Symptoms Identified   Priority: High  Note:   Current Barriers:  [name] Knowledge Deficits related to effective ways of decreasing anxiety and depression  . Care Coordination needs related to resources and support  in a patient with depression as a result of loss of independence and unable to do the things he use to do like reading and shooting pool  . Chronic Disease Management support and education needs related to management of depression  . Lacks caregiver support.  . Film/video editor.  . Non-adherence to scheduled provider appointments . Non-adherence to prescribed medication regimen . Unable to independently manage depression and anxiety  . Does not adhere to prescribed medication regimen . Does not adhere to prescribed psychotropic medication regimen . Lacks social connections . Unable to perform IADLs independently . Does not maintain contact with provider office . Does not contact provider office for questions/concerns  Nurse Case Manager Clinical Goal(s):  [name] Over the next 120 days, patient will verbalize understanding of plan for effective management of depression  . Over the next 120 days, patient will work with [facility], CCM team and pcp to address needs related to depression and patient stating his depression is "bad" . Over the next 120 days, patient will work with CM clinical social worker to assist with depression and anxiety support and education  . Over the next 120 days, the patient will demonstrate ongoing self health care management ability as evidenced by coping with chronic conditions and depression more effectively and finding new hobbies that he enjoys doing  and can do independently *  Interventions:  . 1:1 collaboration with [name], DO regarding development and update of comprehensive plan of care as evidenced by provider attestation and co-signature . Inter-disciplinary care team collaboration (see longitudinal plan of care) . Evaluation of current treatment plan related to depression  and patient's adherence to plan as established by provider. . Advised patient to call the office for worsening mood, anxiety, depression. 01-28-2021: The patient is having an exacerbation in his HF at this time that is also causing increased anxiety and depression. Collaboration with pcp and CCM team. 02-12-2021: The patient reached out to the [facility] because he was very anxious and did not know what to do. The patient states that his mother in law passed away earlier today and his wife is not at home. His daughter is there with his grandchild but not directly with him. He has taken 2 Xanax that he had on hand but they have not helped  him at all. He wants to know if the pcp can give him something to help calm him down because he feels like he is going "crazy". Secure chat to pcp asking or recommendations. The pcp has ordered Buspar 5 mg and he can take 2 to 3 times daily as needed for anxiety. Education and support given to the patient. 05-04-2021: The patient states he is doing much better today. The patient denies any new concerns and is enjoying playing with his granddaughter. The patient states that everything is going well with his health and he is hopeful that the area in his nose will heal up soon. Denies any new concerns at this time.  . Provided education to patient re: alternate ways to deal with depression and anxiety, safety, concerns about depressed state. 02-12-2021: Review of divisional activities, relaxation, mindfulness . Reviewed medications with patient and discussed the patient does not take anything for depression or anxiety. Discussed medication  could help with his depressed symptoms. The patient is not open to taking medications at this time to help with depression. 02-12-2021: Review of Buspar and recommendations by pcp. Education on the patient not taking Xanax as this is something that has not worked for him in the past. The patient has some on hand and did try a couple to see if it would help but it did not. The patient contracted not to take anymore of the Xanax and agrees to take the Buspar. Review of having someone pick it up from the pharmacy for him. 05-04-2021: The patient is compliant with medications and denies any new questions or concerns. [name] Collaborated with pcp and LCSW  regarding increased depression and ideas or suggestions to help with effective management of depression. 02-12-2021: Will alert the LCSW as well of the changes in events today. The LCSW is working with the patient also. 05-04-2021: The LCSW is following the patient. Reminded the patient of upcoming appointment today.  . Provided patient with depression educational materials related to effective management and alternative therapies  . Social Work referral for depression help and recommendations  . Discussed plans with patient for ongoing care management follow up and provided patient with direct contact information for care management team  Patient Goals/Self-Care Activities Over the next 120 days, patient will:  - Patient will self administer medications as prescribed Patient will attend all scheduled provider appointments Patient will call pharmacy for medication refills Patient will continue to perform IADL's independently Patient will call provider office for new concerns or questions Patient will work with BSW to address care coordination needs and will continue to work with the clinical team to address health care and disease management related needs.   - anxiety screen reviewed - depression screen reviewed - medication list reviewed - participation in  psychiatric services encouraged  Follow Up Plan: Telephone follow up appointment with care management team member scheduled for:07-13-2021 at 11:45 am.         Plan:Telephone follow up appointment with care management team member scheduled for:  07-13-2021 at 1145 am  Noreene Larsson RN, MSN, [facility] Mobile: [phone]

## 2021-05-05 ENCOUNTER — Telehealth: Payer: Self-pay | Admitting: *Deleted

## 2021-05-05 NOTE — Telephone Encounter (Signed)
Attempted to review lung screening results with patient. However, there is no answer. Left message for patient to call back to review.

## 2021-05-06 ENCOUNTER — Ambulatory Visit: Payer: Medicare HMO | Admitting: Licensed Clinical Social Worker

## 2021-05-06 ENCOUNTER — Telehealth: Payer: Self-pay | Admitting: *Deleted

## 2021-05-06 DIAGNOSIS — I5022 Chronic systolic (congestive) heart failure: Secondary | ICD-10-CM

## 2021-05-06 DIAGNOSIS — I272 Pulmonary hypertension, unspecified: Secondary | ICD-10-CM

## 2021-05-06 DIAGNOSIS — I5032 Chronic diastolic (congestive) heart failure: Secondary | ICD-10-CM | POA: Diagnosis not present

## 2021-05-06 DIAGNOSIS — F411 Generalized anxiety disorder: Secondary | ICD-10-CM

## 2021-05-06 DIAGNOSIS — F331 Major depressive disorder, recurrent, moderate: Secondary | ICD-10-CM | POA: Diagnosis not present

## 2021-05-06 NOTE — Telephone Encounter (Signed)
Please contact patient. We received results of his CT Screening for Lung Cancer. He was already given those results. Also they identified some "mild Right sided hydronephrosis" of kidney on the imaging and may warrant further imaging to follow-up. We can discuss this at his next apt or within the next 3+ months if or when he is ready to discuss. Sometimes we see some hydronephrosis or swelling of the kidney from poor urination or blockage from prostate or kidney stones. If he has any urinary problems, difficulty going or unable to go, he should be seen much sooner. May need a Urologist in future, otherwise we can discuss options.  Nobie Putnam, DO [facility] 05/06/2021, 4:53 PM

## 2021-05-06 NOTE — Telephone Encounter (Signed)
  Notified patient of LDCT lung cancer screening program results with recommendation for 12 month follow up imaging. Also notified of incidental findings noted below and is encouraged to discuss further with PCP who will receive a copy of this note and/or the CT report. Patient verbalizes understanding. Especially encouraged patient to discuss the hydronephrosis finding with his PCP.   IMPRESSION: 1. Lung-RADS 2, benign appearance or behavior. 2. Mild hydronephrosis upper pole right kidney, incompletely visualized. This is new since CT abdomen/pelvis of 01/30/2021. Consider repeat CT abdomen/pelvis to further evaluate. 3. 4.5 cm ascending thoracic aortic aneurysm. Recommend attention on annual follow-up lung cancer screening CT. 4. Emphysema (ICD10-J43.9) and Aortic Atherosclerosis (ICD10-170.0)

## 2021-05-07 ENCOUNTER — Ambulatory Visit (INDEPENDENT_AMBULATORY_CARE_PROVIDER_SITE_OTHER): Payer: Medicare HMO | Admitting: Otolaryngology

## 2021-05-07 ENCOUNTER — Other Ambulatory Visit: Payer: Self-pay

## 2021-05-07 DIAGNOSIS — Z4889 Encounter for other specified surgical aftercare: Secondary | ICD-10-CM

## 2021-05-07 NOTE — Progress Notes (Signed)
HPI: Michael Delacruz is a 80 y.o. male who presents 7 weeks  s/p excision of left intranasal squamous cell carcinoma with placement of full-thickness skin graft.  He is doing much better..   Past Medical History:  Diagnosis Date  . Anxiety   . Aortic dilatation ([place])    a. 11/2018 Echo: Ao root 22mm, Asc Ao 81mm.  . Arthritis   . Asthma   . Bradycardia   . CAD in native artery    a. LHC 09/2015: 40% pCx, 35% mRCA.  [name] CHF (congestive heart failure) ([place])   . COPD (chronic obstructive pulmonary disease) ([place])   . Dilation of intestine 01/2015  . Fibromyalgia   . Frequent headaches   . GERD (gastroesophageal reflux disease)   . History of blood clots    eye   . History of hiatal hernia   . History of kidney stones   . History of rheumatic fever   . Hypertension   . NICM (nonischemic cardiomyopathy) ([place])    a. 07/2017 Echo: EF reduced to 40-45%; b. 07/2018 Echo: EF 25-30%; c. 11/2018 Echo: EF 30-35%, diff HK w/ sev inf HK, Ao root 30mm, Asc Ao 75mm, Mild MS (grad 42mmHg), mildly dil LA.  [name] PAF (paroxysmal atrial fibrillation) ([place])    a. s/p TEE/DCCV 07/2015. b. H/o bleeding on Coumadin when INR >5, changed to Eliquis-subsequently discontinued; c. 04/2018 s/p SJM 3562 Quadra Allure MP DC PPM & AVN ablation.  . S/P Minimally invasive maze operation for atrial fibrillation 10/22/2015   Complete bilateral atrial lesion set using cryothermy and bipolar radiofrequency ablation with clipping of LA appendage via right mini thoracotomy approach  . S/P minimally invasive mitral valve repair 10/22/2015   Complex valvuloplasty including triangular resection of posterior leaflet, artificial Gore-tex neochord placement x6 and 38 mm Sorin Memo 3D Rechord ring annuloplasty via right minithoracotomy approach  . Severe mitral regurgitation s/p MVR    a. s/p MV repair 10/2015; b. 11/2018 Echo: Mild MS (mean grad 48mmHg).  . Sleep apnea    no longer uses cpap and doesn't use O2 at home  . Stroke [facility])     no  vision in right, on occasion sees a pinpoint   . Thyroid disorder   . TIA (transient ischemic attack)   . Tobacco abuse    Past Surgical History:  Procedure Laterality Date  . ANKLE SURGERY    . AV NODE ABLATION N/A 05/04/2018   Procedure: AV NODE ABLATION;  Surgeon: Thompson Grayer, MD;  Location: [place] [facility];  Service: Cardiovascular;  Laterality: N/A;  . BIV PACEMAKER INSERTION CRT-P N/A 05/03/2018   Procedure: BIV PACEMAKER INSERTION CRT-P;  Surgeon: Deboraha Sprang, MD;  Location: [place] [facility];  Service: Cardiovascular;  Laterality: N/A;  . CARDIAC CATHETERIZATION N/A 10/03/2015   Procedure: Right and Left Heart Cath and Coronary Angiography;  Surgeon: [name], MD;  Location: [place] [facility];  Service: Cardiovascular;  Laterality: N/A;  . COLONOSCOPY    . ELECTROPHYSIOLOGIC STUDY N/A 08/18/2015   Procedure: CARDIOVERSION;  Surgeon: Wellington Hampshire, MD;  Location: [facility] ORS;  Service: Cardiovascular;  Laterality: N/A;  . MASS EXCISION N/A 03/04/2021   Procedure: EXCISION INNER NASAL CANCER;  Surgeon: Rozetta Nunnery, MD;  Location: [facility] OR;  Service: ENT;  Laterality: N/A;  . MINIMALLY INVASIVE MAZE PROCEDURE N/A 10/22/2015   Procedure: MINIMALLY INVASIVE MAZE PROCEDURE;  Surgeon: Rexene Alberts, MD;  Location: [place];  Service: Open Heart Surgery;  Laterality: N/A;  . MITRAL VALVE REPAIR Right 10/22/2015   Procedure: MINIMALLY INVASIVE MITRAL VALVE REPAIR (MVR);  Surgeon: Rexene Alberts, MD;  Location: [place];  Service: Open Heart Surgery;  Laterality: Right;  . SINUS EXPLORATION    . SKIN FULL THICKNESS GRAFT N/A 03/04/2021   Procedure: SKIN GRAFT FULL THICKNESS;  Surgeon: Rozetta Nunnery, MD;  Location: [place];  Service: ENT;  Laterality: N/A;  . TEE WITHOUT CARDIOVERSION N/A 08/18/2015   Procedure: TRANSESOPHAGEAL ECHOCARDIOGRAM (TEE);  Surgeon: Wellington Hampshire, MD;  Location: [facility] ORS;  Service: Cardiovascular;  Laterality: N/A;  . TEE WITHOUT  CARDIOVERSION N/A 10/22/2015   Procedure: TRANSESOPHAGEAL ECHOCARDIOGRAM (TEE);  Surgeon: Rexene Alberts, MD;  Location: [place];  Service: Open Heart Surgery;  Laterality: N/A;   Social History   Socioeconomic History  . Marital status: Married    Spouse name: [name]  . Number of children: 3  . Years of education: GED  . Highest education level: GED or equivalent  Occupational History  . Not on file  Tobacco Use  . Smoking status: Current Every Day Smoker    Packs/day: 0.50    Years: 60.00    Pack years: 30.00    Types: Cigarettes    Start date: 41  . Smokeless tobacco: Never Used  . Tobacco comment: Resumed smoking, after quit 01/2018  Vaping Use  . Vaping Use: Never used  Substance and Sexual Activity  . Alcohol use: Not Currently    Alcohol/week: 1.0 standard drink    Types: 1 Standard drinks or equivalent per week    Comment: occasionally drinks a margarita  . Drug use: No  . Sexual activity: Not on file  Other Topics Concern  . Not on file  Social History Narrative   Right handed    2-3 cups coffee per day         Social Determinants of Health   Financial Resource Strain: High Risk  . Difficulty of Paying Living Expenses: Hard  Food Insecurity: Food Insecurity Present  . Worried About Charity fundraiser in the Last Year: Often true  . Ran Out of Food in the Last Year: Often true  Transportation Needs: No Transportation Needs  . Lack of Transportation (Medical): No  . Lack of Transportation (Non-Medical): No  Physical Activity: Inactive  . Days of Exercise per Week: 0 days  . Minutes of Exercise per Session: 0 min  Stress: No Stress Concern Present  . Feeling of Stress : Not at all  Social Connections: Moderately Isolated  . Frequency of Communication with Friends and Family: More than three times a week  . Frequency of Social Gatherings with Friends and Family: More than three times a week  . Attends Religious Services: Never  . Active Member of Clubs  or Organizations: No  . Attends Archivist Meetings: Never  . Marital Status: Married   Family History  Problem Relation Age of Onset  . Stroke Mother   . Irregular heart beat Mother   . Heart murmur Brother   . Pulmonary embolism Brother   . Hypertension Other   . Pulmonary embolism Maternal Uncle    Allergies  Allergen Reactions  . Codeine Nausea Only  . Digoxin And Related     SOB, bad dreams  . Macrodantin [Nitrofurantoin Macrocrystal] Rash  . Morphine And Related Rash   Prior to Admission medications   Medication Sig Start Date End Date Taking? Authorizing Provider  HYDROcodone-acetaminophen (HYCET) 7.5-325  mg/15 ml solution Take 10-15 mLs by mouth every 6 (six) hours as needed for moderate pain. 03/05/21 03/05/22  Rozetta Nunnery, MD  Acetaminophen (TYLENOL 8 HOUR PO) Take by mouth every 8 (eight) hours. As needed    [provider]  busPIRone (BUSPAR) 5 MG tablet Take 1 tablet (5 mg total) by mouth 3 (three) times daily as needed (anxiety). 02/12/21   Karamalegos, Devonne Doughty, DO  cephALEXin (KEFLEX) 500 MG capsule Take 1 capsule (500 mg total) by mouth 3 (three) times daily. 03/04/21   Rozetta Nunnery, MD  furosemide (LASIX) 40 MG tablet Take 1 tablet (40 mg total) by mouth daily as needed (As needed for weight gain or shortness of breath). 02/09/21   Theora Gianotti, NP  Homeopathic Products (HOMEOPATHIC CALM SL) Place under the tongue. Is using Doterra essential oil of Onguard ingestion daily    [provider]  ipratropium (ATROVENT) 0.02 % nebulizer solution Take 2.5 mLs (0.5 mg total) by nebulization 4 (four) times daily. 02/20/21   Rozetta Nunnery, MD  ipratropium (ATROVENT) 0.06 % nasal spray Place 2 sprays into both nostrils 4 (four) times daily. As needed 02/06/21   [name], DO  mupirocin ointment (BACTROBAN) 2 % Apply 1 application topically 3 (three) times daily. 02/18/21   [provider]   QUEtiapine (SEROQUEL) 25 MG tablet TAKE 1 TABLET BY MOUTH EVERYDAY AT BEDTIME 03/11/21   [name], DO     Physical Exam: He has minimal crusting on the left side of the septum no obvious evidence of recurrent cancer or persistent cancer.  Graft appears to be healing nicely.   Assessment: S/p excision of left intranasal squamous cell carcinoma  Plan: He will follow-up in 4 months for recheck.   Radene Journey, MD

## 2021-05-07 NOTE — Patient Instructions (Signed)
Visit Information  Goals Addressed              This Visit's Progress     Patient Stated   .  SW-"I need to take better care of myself." (pt-stated)   On track      Timeframe:  Long-Range Goal Priority:  Medium  Start Date:  02/10/21                         Expected End Date:  08/19/21                    Follow Up Date- 07/28/21  Patient Self Care Activities:  . Attend all scheduled provider appointments . Call provider office for new concerns or questions . Utilize strategies discussed to assist in management of symptoms       Patient verbalizes understanding of instructions provided today and agrees to view in [place].   Telephone follow up appointment with care management team member scheduled for:07/28/21  Christa See, MSW, [email or web address] Phone [phone] 1:28 PM

## 2021-05-07 NOTE — Chronic Care Management (AMB) (Signed)
Chronic Care Management    Clinical Social Work Note  05/07/2021 Name: Michael Delacruz MRN: [id number] DOB: [birth year]  Michael Delacruz is a 80 y.o. year old male who is a primary care patient of [name], DO. The CCM team was consulted to assist the patient with chronic disease management and/or care coordination needs related to: Mental Health Counseling and Resources.   Engaged with patient's spouse by telephone for follow up visit in response to provider referral for social work chronic care management and care coordination services.   Consent to Services:  The patient was given information about Chronic Care Management services, agreed to services, and gave verbal consent prior to initiation of services.  Please see initial visit note for detailed documentation.   Patient agreed to services and consent obtained.   Assessment: Patient's spouse provided all information during this encounter. Patient is currently experiencing difficulty with management of chronic health conditions. He continues to endorse decrease in appetite, low energy, and back pain. Patient receives strong support from spouse. See Care Plan below for interventions and patient self-care actives. Recent life changes /stressors: Management of health conditions Recommendation: Patient may benefit from, and is in agreement to continue compliance with medication management and utilization of healthy coping skills.  Follow up Plan: Patient would like continued follow-up.  CCM LCSW will follow up with patient on 07/28/21. Patient will call office if needed prior to next encounter.  SDOH (Social Determinants of Health) assessments and interventions performed:    Advanced Directives Status: Not addressed in this encounter.  CCM Care Plan  Allergies  Allergen Reactions  . Codeine Nausea Only  . Digoxin And Related     SOB, bad dreams  . Macrodantin [Nitrofurantoin Macrocrystal] Rash  . Morphine And  Related Rash    Outpatient Encounter Medications as of 05/06/2021  Medication Sig Note  . HYDROcodone-acetaminophen (HYCET) 7.5-325 mg/15 ml solution Take 10-15 mLs by mouth every 6 (six) hours as needed for moderate pain.   . Acetaminophen (TYLENOL 8 HOUR PO) Take by mouth every 8 (eight) hours. As needed   . busPIRone (BUSPAR) 5 MG tablet Take 1 tablet (5 mg total) by mouth 3 (three) times daily as needed (anxiety). 03/03/2021: Pt states he may take 1 daily if needed  . cephALEXin (KEFLEX) 500 MG capsule Take 1 capsule (500 mg total) by mouth 3 (three) times daily.   . furosemide (LASIX) 40 MG tablet Take 1 tablet (40 mg total) by mouth daily as needed (As needed for weight gain or shortness of breath). 03/03/2021: Takes daily   . Homeopathic Products (HOMEOPATHIC CALM SL) Place under the tongue. Is using Doterra essential oil of Onguard ingestion daily   . ipratropium (ATROVENT) 0.02 % nebulizer solution Take 2.5 mLs (0.5 mg total) by nebulization 4 (four) times daily. 03/03/2021: No longer using this medication  . ipratropium (ATROVENT) 0.06 % nasal spray Place 2 sprays into both nostrils 4 (four) times daily. As needed   . mupirocin ointment (BACTROBAN) 2 % Apply 1 application topically 3 (three) times daily. 03/03/2021: No longer using this medication  . QUEtiapine (SEROQUEL) 25 MG tablet TAKE 1 TABLET BY MOUTH EVERYDAY AT BEDTIME    No facility-administered encounter medications on file as of 05/06/2021.    Patient Active Problem List   Diagnosis Date Noted  . Failure to thrive in adult 02/01/2021  . Altered mental status 01/31/2021  . CHB (complete heart block) ([place]) 01/31/2021  . Encephalopathy 01/31/2021  . Generalized  anxiety disorder 05/04/2018  . OSA (obstructive sleep apnea) 05/04/2018  . History of stroke 05/04/2018  . Retinal hemorrhage, left eye 05/04/2018  . Late effects of cerebral ischemic stroke 05/04/2018  . Hypothyroidism 05/04/2018  . Permanent atrial fibrillation  ([place]) 05/02/2018  . Tachycardia induced cardiomyopathy ([place]) 05/02/2018  . Hypokalemia 05/02/2018  . Constipation 04/25/2018  . Insomnia due to medical condition 02/22/2018  . Depression, major, recurrent, moderate ([place]) 01/16/2018  . Somatic symptom disorder 01/16/2018  . Vision loss 12/31/2015  . TIA (transient ischemic attack) 12/06/2015  . S/P MVR (mitral valve repair) 11/11/2015  . S/P Minimally invasive maze operation for atrial fibrillation 10/22/2015  . Systolic CHF, chronic ([place])   . Atrial fibrillation with RVR ([place])   . Centrilobular emphysema ([place])   . Hypotension 11/02/2012  . Stress at home 11/02/2012  . Smoking 04/27/2012  . Hyperlipidemia 04/27/2012  . CAD (coronary artery disease) 04/27/2012  . Heart palpitations 09/03/2011  . Dizziness 05/19/2011  . MVP (mitral valve prolapse) 05/05/2011  . Mitral valve regurgitation 05/05/2011  . Pulmonary HTN ([place]) 05/05/2011    Conditions to be addressed/monitored: Anxiety and Depression; Mental Health Concerns   Care Plan : General Social Work (Adult)  Updates made by Rebekah [place], LCSW since 05/07/2021 12:00 AM    Problem: Quality of Life (General Plan of Care)     Long-Range Goal: Quality of Life Maintained   Start Date: 02/10/2021  Note:   Timeframe:  Long-Range Goal Priority:  Medium  Start Date:  02/10/21                         Expected End Date:  08/19/21                    Follow Up Date- 07/28/21  Current Barriers:  . Financial constraints related to managing health care . Housing barriers . ADL IADL limitations . Social Isolation  Clinical Social Work Clinical Goal(s):  [name] Over the next 90 days, client will work with SW to address concerns related to knowledge deficit of appropriate self-care and depression management tools to implement into his daily routine to promote healthy living   Interventions: . CCM LCSW interviewed patient's spouse and appropriate assessments performed . Patient "just doesn't  feel good" due to chronic medical conditions. He has low energy despite taking several naps during the day, decrease in appetite, and he complains of back pain. Patient recently completed scan on lungs and is awaiting results. CCM LCSW informed spouse that per chart review, a message was left regarding test results. Spouse has left a message for a return call.  [name] CCM LCSW updated spouse of patient's upcoming appointments  . LCSW used active and reflective listening and implemented appropriate interventions to help suppport emotional needs of patient and spouse. Strategies identified to combat stressors . Discussed plans with patient for ongoing care management follow up and provided patient with direct contact information for care management team  Patient Self Care Activities:  . Attend all scheduled provider appointments . Call provider office for new concerns or questions . Utilize strategies discussed to assist in management of symptoms         Christa See, MSW, [facility] Management [email or web address] Phone [phone] 1:26 PM

## 2021-05-10 NOTE — Progress Notes (Signed)
Evaluation Performed:  Follow-up visit  Date:  05/11/2021   ID:  Michael Delacruz, Michael Delacruz 1941-07-01, MRN [id number]  Patient Location:  [place] [id number]   Provider location:   [facility], [place] office  PCP:  [name], DO  Cardiologist:  Arvid Right [facility]  Chief Complaint  Patient presents with  . 3 month follow up     Patient c/o shortness of breath and chest pain at times. Medications reviewed by the patient verbally.     History of Present Illness:    Michael Delacruz is a 80 y.o. male  past medical history of long smoking history for 50 years with underlying COPD,  severe mitral valve regurgitation on echocardiogram,  prolapse of posterior leaflet,  moderate pulmonary hypertension  s/p successful MR repair by right thoracotomy by Dr. Roxy Manns 11/ 2016 Smokes 1 ppd PAF on warfarin had bleeding, now on asa, s/p Maze Surgical report indicates clipping of left atrial appendage, Atricure left atrial clip, size 45 mm Previous history of retinal bleeding felt exacerbated by warfarin , requiring surgery 2 residual right eye  vision deficits Atrial flutter EF 30 to 35% Who presents for follow up of his MR repair and atrial flutter  LOV 11/2020 Seen by one of our providers 01/2021  covid 19 early 2022 associated severe weakness, restlessness, and altered mental status.  fell in the shower at one point and struck the left side of his upper chest associated with severe pain.  His wife noted poor appetite and significant weight loss.  He was taken to the emergency department on February 11, where CT of the head was negative.  Chest x-ray was without acute findings.  He was discharged home on February 14  Some stress, newborn baby living at their house Was born prematurely  Continues to smoke  CT chest 04/29/2021 4.5 cm ascending thoracic aortic aneurysm  Poor appetite, lost 30 pounds  Chronic vision issue No  vision in right eye,  Has  CRT-P and AV junction ablation  Denies any shortness of breath, leg swelling PND orthopnea abdominal distention  Echo 2019 reviewed estimated ejection fraction was in the  range of 30% to 35%.  EKG personally reviewed by myself on todays visit Paced rhythm rate 80 bpm  Other past medical history reviewed Previously declined anticoagulation and cardioversion  He was started on amiodarone as a class IIb indication for rate control   amio has been discontinued because of nightmares.    May [birth year]29 acute systolic heart failure with symptoms of impending doom.   He was in atrial flutter with uncontrolled rate.    underwent CRT-P and AV junction ablation.    Failed Entresto in the past secondary to orthostasis    Past Medical History:  Diagnosis Date  . Anxiety   . Aortic dilatation ([place])    a. 11/2018 Echo: Ao root 85mm, Asc Ao 68mm.  . Arthritis   . Asthma   . Bradycardia   . CAD in native artery    a. LHC 09/2015: 40% pCx, 35% mRCA.  [name] CHF (congestive heart failure) ([place])   . COPD (chronic obstructive pulmonary disease) ([place])   . Dilation of intestine 01/2015  . Fibromyalgia   . Frequent headaches   . GERD (gastroesophageal reflux disease)   . History of blood clots    eye   . History of hiatal hernia   . History of kidney stones   .  History of rheumatic fever   . Hypertension   . NICM (nonischemic cardiomyopathy) ([place])    a. 07/2017 Echo: EF reduced to 40-45%; b. 07/2018 Echo: EF 25-30%; c. 11/2018 Echo: EF 30-35%, diff HK w/ sev inf HK, Ao root 74mm, Asc Ao 73mm, Mild MS (grad 70mmHg), mildly dil LA.  [name] PAF (paroxysmal atrial fibrillation) ([place])    a. s/p TEE/DCCV 07/2015. b. H/o bleeding on Coumadin when INR >5, changed to Eliquis-subsequently discontinued; c. 04/2018 s/p SJM 3562 Quadra Allure MP DC PPM & AVN ablation.  . S/P Minimally invasive maze operation for atrial fibrillation 10/22/2015   Complete bilateral atrial lesion set using  cryothermy and bipolar radiofrequency ablation with clipping of LA appendage via right mini thoracotomy approach  . S/P minimally invasive mitral valve repair 10/22/2015   Complex valvuloplasty including triangular resection of posterior leaflet, artificial Gore-tex neochord placement x6 and 38 mm Sorin Memo 3D Rechord ring annuloplasty via right minithoracotomy approach  . Severe mitral regurgitation s/p MVR    a. s/p MV repair 10/2015; b. 11/2018 Echo: Mild MS (mean grad 97mmHg).  . Sleep apnea    no longer uses cpap and doesn't use O2 at home  . Stroke [facility])     no vision in right, on occasion sees a pinpoint   . Thyroid disorder   . TIA (transient ischemic attack)   . Tobacco abuse    Past Surgical History:  Procedure Laterality Date  . ANKLE SURGERY    . AV NODE ABLATION N/A 05/04/2018   Procedure: AV NODE ABLATION;  Surgeon: Thompson Grayer, MD;  Location: [place] CV LAB;  Service: Cardiovascular;  Laterality: N/A;  . BIV PACEMAKER INSERTION CRT-P N/A 05/03/2018   Procedure: BIV PACEMAKER INSERTION CRT-P;  Surgeon: Deboraha Sprang, MD;  Location: [place] [facility];  Service: Cardiovascular;  Laterality: N/A;  . CARDIAC CATHETERIZATION N/A 10/03/2015   Procedure: Right and Left Heart Cath and Coronary Angiography;  Surgeon: [name], MD;  Location: [place] [facility];  Service: Cardiovascular;  Laterality: N/A;  . COLONOSCOPY    . ELECTROPHYSIOLOGIC STUDY N/A 08/18/2015   Procedure: CARDIOVERSION;  Surgeon: Wellington Hampshire, MD;  Location: [facility] ORS;  Service: Cardiovascular;  Laterality: N/A;  . MASS EXCISION N/A 03/04/2021   Procedure: EXCISION INNER NASAL CANCER;  Surgeon: Rozetta Nunnery, MD;  Location[facility];  Service: ENT;  Laterality: N/A;  . MINIMALLY INVASIVE MAZE PROCEDURE N/A 10/22/2015   Procedure: MINIMALLY INVASIVE MAZE PROCEDURE;  Surgeon: Rexene Alberts, MD;  Location: [place];  Service: Open Heart Surgery;  Laterality: N/A;  . MITRAL VALVE REPAIR Right  10/22/2015   Procedure: MINIMALLY INVASIVE MITRAL VALVE REPAIR (MVR);  Surgeon: Rexene Alberts, MD;  Location: [place];  Service: Open Heart Surgery;  Laterality: Right;  . SINUS EXPLORATION    . SKIN FULL THICKNESS GRAFT N/A 03/04/2021   Procedure: SKIN GRAFT FULL THICKNESS;  Surgeon: Rozetta Nunnery, MD;  Location: [place];  Service: ENT;  Laterality: N/A;  . TEE WITHOUT CARDIOVERSION N/A 08/18/2015   Procedure: TRANSESOPHAGEAL ECHOCARDIOGRAM (TEE);  Surgeon: Wellington Hampshire, MD;  Location: [facility] ORS;  Service: Cardiovascular;  Laterality: N/A;  . TEE WITHOUT CARDIOVERSION N/A 10/22/2015   Procedure: TRANSESOPHAGEAL ECHOCARDIOGRAM (TEE);  Surgeon: Rexene Alberts, MD;  Location: [place];  Service: Open Heart Surgery;  Laterality: N/A;     Current Meds  Medication Sig  . Acetaminophen (TYLENOL 8 HOUR PO) Take by mouth every 8 (eight)  hours. As needed  . busPIRone (BUSPAR) 5 MG tablet Take 1 tablet (5 mg total) by mouth 3 (three) times daily as needed (anxiety).  . cephALEXin (KEFLEX) 500 MG capsule Take 1 capsule (500 mg total) by mouth 3 (three) times daily.  . furosemide (LASIX) 40 MG tablet Take 1 tablet (40 mg total) by mouth daily as needed (As needed for weight gain or shortness of breath).  . Homeopathic Products (HOMEOPATHIC CALM SL) Place under the tongue. Is using Doterra essential oil of Onguard ingestion daily  . HYDROcodone-acetaminophen (HYCET) 7.5-325 mg/15 ml solution Take 10-15 mLs by mouth every 6 (six) hours as needed for moderate pain.  [name] ipratropium (ATROVENT) 0.06 % nasal spray Place 2 sprays into both nostrils 4 (four) times daily. As needed  . QUEtiapine (SEROQUEL) 25 MG tablet TAKE 1 TABLET BY MOUTH EVERYDAY AT BEDTIME     Allergies:   Codeine, Digoxin and related, Macrodantin [nitrofurantoin macrocrystal], and Morphine and related   Social History   Tobacco Use  . Smoking status: Current Every Day Smoker    Packs/day: 0.50    Years: 60.00    Pack years: 30.00     Types: Cigarettes    Start date: 78  . Smokeless tobacco: Never Used  . Tobacco comment: Resumed smoking, after quit 01/2018  Vaping Use  . Vaping Use: Never used  Substance Use Topics  . Alcohol use: Not Currently    Alcohol/week: 1.0 standard drink    Types: 1 Standard drinks or equivalent per week    Comment: occasionally drinks a margarita  . Drug use: No     Current Outpatient Medications on File Prior to Visit  Medication Sig Dispense Refill  . Acetaminophen (TYLENOL 8 HOUR PO) Take by mouth every 8 (eight) hours. As needed    . busPIRone (BUSPAR) 5 MG tablet Take 1 tablet (5 mg total) by mouth 3 (three) times daily as needed (anxiety). 60 tablet 1  . cephALEXin (KEFLEX) 500 MG capsule Take 1 capsule (500 mg total) by mouth 3 (three) times daily. 21 capsule 0  . furosemide (LASIX) 40 MG tablet Take 1 tablet (40 mg total) by mouth daily as needed (As needed for weight gain or shortness of breath). 90 tablet 3  . Homeopathic Products (HOMEOPATHIC CALM SL) Place under the tongue. Is using Doterra essential oil of Onguard ingestion daily    . HYDROcodone-acetaminophen (HYCET) 7.5-325 mg/15 ml solution Take 10-15 mLs by mouth every 6 (six) hours as needed for moderate pain. 240 mL 0  . ipratropium (ATROVENT) 0.06 % nasal spray Place 2 sprays into both nostrils 4 (four) times daily. As needed 15 mL 3  . QUEtiapine (SEROQUEL) 25 MG tablet TAKE 1 TABLET BY MOUTH EVERYDAY AT BEDTIME 90 tablet 1   No current facility-administered medications on file prior to visit.     Family Hx: The patient's family history includes Heart murmur in his brother; Hypertension in an other family member; Irregular heart beat in his mother; Pulmonary embolism in his brother and maternal uncle; Stroke in his mother.  ROS:   Please see the history of present illness.    Review of Systems  Constitutional: Positive for malaise/fatigue.  HENT:       Pain in his nose left side  Respiratory: Negative.    Cardiovascular: Negative.   Gastrointestinal: Negative.   Musculoskeletal: Negative.   Neurological: Negative.   Psychiatric/Behavioral: Negative.   All other systems reviewed and are negative.    Labs/Other Tests  and Data Reviewed:    Recent Labs: 01/30/2021: B Natriuretic Peptide 242.4; TSH 1.029 01/31/2021: ALT 77 02/02/2021: Magnesium 2.6 03/04/2021: BUN 11; Creatinine, Ser 1.12; Hemoglobin 13.2; Platelets 165; Potassium 3.2; Sodium 139   Recent Lipid Panel Lab Results  Component Value Date/Time   CHOL 215 (H) 05/01/2018 10:23 AM   TRIG 79 05/01/2018 10:23 AM   HDL 42 05/01/2018 10:23 AM   CHOLHDL 5.1 05/01/2018 10:23 AM   LDLCALC 157 (H) 05/01/2018 10:23 AM    Wt Readings from Last 3 Encounters:  05/11/21 177 lb 8 oz (80.5 kg)  03/04/21 175 lb (79.4 kg)  02/09/21 178 lb (80.7 kg)     Exam:    Vital Signs: Vital signs may also be detailed in the HPI BP 120/70 (BP Location: Left Arm, Patient Position: Sitting, Cuff Size: Normal)   Pulse 80   Ht 5\' 10"  (1.778 m)   Wt 177 lb 8 oz (80.5 kg)   SpO2 98%   BMI 25.47 kg/m   Constitutional:  oriented to person, place, and time. No distress.  HENT:  Head: Grossly normal Eyes:  no discharge. No scleral icterus.  Neck: No JVD, no carotid bruits  Cardiovascular: Regular rate and rhythm, no murmurs appreciated Pulmonary/Chest: Clear to auscultation bilaterally, no wheezes or rails Abdominal: Soft.  no distension.  no tenderness.  Musculoskeletal: Normal range of motion Neurological:  normal muscle tone. Coordination normal. No atrophy Skin: Skin warm and dry Psychiatric: normal affect, pleasant   ASSESSMENT & PLAN:    Chronic atrial fibrillation Does not want anticoagultion, very concerned about bleeding in his eye Prior AV node ablation with pacer,  Feels he is asymptomatic  Complete heart block (HCC) paced rhythm  NICM (nonischemic cardiomyopathy) ([place]) EF 30 to 35% in 11/2018 up to 45 to 50% Does not want  meds, discussed with him again on today's visit On lasix as needed  Atrial flutter, unspecified type ([place]) Previous TIA/strokes Does not want anticoagulation  Nose pain, extending into the cheek left eye Had surgery, basal cell, recovered  Dilated aorta aortic ascending aorta is 4.5 cm, will need periodic imaging on annual basis Results discussed with him stable    Total encounter time more than 25 minutes  Greater than 50% was spent in counseling and coordination of care with the patient    Signed, Ida Rogue, MD  05/11/2021 12:05 PM    [place] Office [address], [place], [place] [address]

## 2021-05-11 ENCOUNTER — Ambulatory Visit (INDEPENDENT_AMBULATORY_CARE_PROVIDER_SITE_OTHER): Payer: Medicare HMO | Admitting: Cardiovascular Disease

## 2021-05-11 ENCOUNTER — Encounter: Payer: Self-pay | Admitting: Cardiovascular Disease

## 2021-05-11 VITALS — BP 120/70 | HR 80 | Ht 70.0 in | Wt 177.5 lb

## 2021-05-11 DIAGNOSIS — I4821 Permanent atrial fibrillation: Secondary | ICD-10-CM

## 2021-05-11 DIAGNOSIS — I428 Other cardiomyopathies: Secondary | ICD-10-CM

## 2021-05-11 DIAGNOSIS — Z9889 Other specified postprocedural states: Secondary | ICD-10-CM

## 2021-05-11 DIAGNOSIS — I442 Atrioventricular block, complete: Secondary | ICD-10-CM | POA: Diagnosis not present

## 2021-05-11 DIAGNOSIS — I5020 Unspecified systolic (congestive) heart failure: Secondary | ICD-10-CM

## 2021-05-11 DIAGNOSIS — Z950 Presence of cardiac pacemaker: Secondary | ICD-10-CM | POA: Diagnosis not present

## 2021-05-11 NOTE — Patient Instructions (Signed)
Medication Instructions:  No changes  If you need a refill on your cardiac medications before your next appointment, please call your pharmacy.    Lab work: No new labs needed   If you have labs (blood work) drawn today and your tests are completely normal, you will receive your results only by: . MyChart Message (if you have MyChart) OR . A paper copy in the mail If you have any lab test that is abnormal or we need to change your treatment, we will call you to review the results.   Testing/Procedures: No new testing needed   Follow-Up: At [facility], you and your health needs are our priority.  As part of our continuing mission to provide you with exceptional heart care, we have created designated Provider Care Teams.  These Care Teams include your primary Cardiologist (physician) and Advanced Practice Providers (APPs -  Physician Assistants and Nurse Practitioners) who all work together to provide you with the care you need, when you need it.  . You will need a follow up appointment in 12 months  . Providers on your designated Care Team:   . Christopher Berge, NP . Ryan Dunn, PA-C . Jacquelyn Visser, PA-C  Any Other Special Instructions Will Be Listed Below (If Applicable).  COVID-19 Vaccine Information can be found at: [email or web address] For questions related to vaccine distribution or appointments, please email [email or web address] or call [phone].     

## 2021-05-20 NOTE — Progress Notes (Signed)
Remote pacemaker transmission.   

## 2021-05-25 ENCOUNTER — Ambulatory Visit (INDEPENDENT_AMBULATORY_CARE_PROVIDER_SITE_OTHER): Payer: Medicare HMO

## 2021-05-25 DIAGNOSIS — I5020 Unspecified systolic (congestive) heart failure: Secondary | ICD-10-CM

## 2021-05-25 DIAGNOSIS — Z950 Presence of cardiac pacemaker: Secondary | ICD-10-CM | POA: Diagnosis not present

## 2021-05-26 NOTE — Progress Notes (Signed)
EPIC Encounter for ICM Monitoring  Patient Name: Michael Delacruz is a 80 y.o. male Date: 05/26/2021 Primary Care Physican: [name], DO Primary Cardiologist:Gollan Electrophysiologist:Klein Bi-V Pacing:94% 05/11/2021 Weight: 177lbs    Spoke with patient and reports feeling fine with exception of occasional "electrical shock feeling" that only last a couple of a seconds.   CorVue thoracic impedancenormal.  Prescribed:  Furosemide40 mgTake 1 tablet (40 mg total) by mouth daily as needed (As needed for weight gain or shortness of breath).  Recommendations:No changes and encouraged to call if experiencing any fluid symptoms.  Follow-up plan: ICM clinic phone appointment on7/10/2021. 91 day device clinic remote transmission 07/28/2021.   EP/Cardiology Office Visits: Recall 11/25/2021 with Dr.Gollan.   Copy of ICM check sent to [place].  3 month ICM trend: 05/25/2021.    1 Year ICM trend:       [name], RN 05/26/2021 12:48 PM

## 2021-06-29 ENCOUNTER — Ambulatory Visit (INDEPENDENT_AMBULATORY_CARE_PROVIDER_SITE_OTHER): Payer: Medicare HMO

## 2021-06-29 DIAGNOSIS — Z950 Presence of cardiac pacemaker: Secondary | ICD-10-CM | POA: Diagnosis not present

## 2021-06-29 DIAGNOSIS — I5020 Unspecified systolic (congestive) heart failure: Secondary | ICD-10-CM

## 2021-06-30 ENCOUNTER — Telehealth: Payer: Self-pay

## 2021-06-30 NOTE — Telephone Encounter (Signed)
Remote ICM transmission received.  Attempted call to patient regarding ICM remote transmission and left detailed message per DPR.  Advised to return call for any fluid symptoms or questions. Next ICM remote transmission scheduled 08/03/2021.

## 2021-06-30 NOTE — Progress Notes (Signed)
EPIC Encounter for ICM Monitoring  Patient Name: Michael Delacruz is a 80 y.o. male Date: 06/30/2021 Primary Care Physican: [name], DO Primary Cardiologist: [name] Electrophysiologist: Vergie Living Pacing: 94%            05/11/2021 Weight: 177 lbs                                                            Attempted call to patient and unable to reach.  Left detailed message per DPR regarding transmission. Transmission reviewed.    CorVue thoracic impedance normal.   Prescribed: Furosemide 40 mg Take 1 tablet (40 mg total) by mouth daily as needed (As needed for weight gain or shortness of breath).   Recommendations:  Left voice mail with ICM number and encouraged to call if experiencing any fluid symptoms.   Follow-up plan: ICM clinic phone appointment on 08/03/2021.   91 day device clinic remote transmission 07/28/2021.     EP/Cardiology Office Visits:  Recall 11/25/2021 with Dr. [name].     Copy of ICM check sent to Dr. Caryl Comes.    3 month ICM trend: 06/29/2021.    1 Year ICM trend:       Michael Billings, RN 06/30/2021 12:49 PM

## 2021-07-02 ENCOUNTER — Telehealth: Payer: Self-pay | Admitting: Internal Medicine

## 2021-07-02 NOTE — Telephone Encounter (Signed)
   Pt c/o of Chest Pain: STAT if CP now or developed within 24 hours  1. Are you having CP right now? yes  2. Are you experiencing any other symptoms (ex. SOB, nausea, vomiting, sweating)? SOB  3. How long have you been experiencing CP? About 1 week  4. Is your CP continuous or coming and going? Continuous  5. Have you taken Nitroglycerin? no ? Patients wife calling in reporting patient is having pain around the pacemaker area on his chest. Patient states it is worse when lying down but today he is having pain continuously while sitting and moving. Reports this pain has been going on for a week. Patient has been scheduled for 10/4 as of now. Patients wife reports there have been no medication changes, no strenuous activity and that deep breaths make him uncomfortable.   Please advise

## 2021-07-02 NOTE — Telephone Encounter (Signed)
I spoke with [name] (ok per DPR)- patient in the back ground as well. Per [name], for ~ 1 week, the patient has been having chest pain while laying down. Today he is having chest pain "around the pacer site" while sitting, standing, walking, & laying. The patient is not worse when he presses on the area.  He does confirm that he feels more SOB over the last week and more so today.  He does feel like it is harder for him to take a deep breath.  His weight is stable (followed in ICM clinic).   He denies any nausea or recent strenuous activity.  He has had no recent respiratory illness that he is aware of.  He has had 1 episode of pain radiating to his shoulder prior to today.  I have advised the patient and his wife that I am uncertain that his symptoms are cardiac related, but have offered them an appointment for evaluation in the office tomorrow with Ignacia Bayley, NP.  The patient and his wife are in favor of an in office visit.   I did not ask him to transmit, but will reach out to the [facility] team to see if they can ask him to transmit prior to his appointment tomorrow.

## 2021-07-02 NOTE — Telephone Encounter (Signed)
The patient wife helped send the patient transmission. I told her the nurse will call back.Transmission received.

## 2021-07-02 NOTE — Telephone Encounter (Signed)
Attempted to contact patient to send manual transmission. No asnwer, LMTCB.

## 2021-07-02 NOTE — Telephone Encounter (Signed)
Remote transmission reviewed and no episodes are noted. Presenting rhythm shows BVP 88 bpm. Advised patient if his symptoms worsen to seek ED treatment. Patient agreeable and verbalized understanding.

## 2021-07-03 ENCOUNTER — Encounter: Payer: Self-pay | Admitting: Nurse Practitioner

## 2021-07-03 ENCOUNTER — Other Ambulatory Visit: Payer: Self-pay

## 2021-07-03 ENCOUNTER — Ambulatory Visit (INDEPENDENT_AMBULATORY_CARE_PROVIDER_SITE_OTHER): Payer: Medicare HMO | Admitting: Nurse Practitioner

## 2021-07-03 VITALS — BP 130/80 | HR 79 | Ht 70.0 in | Wt 178.0 lb

## 2021-07-03 DIAGNOSIS — I4821 Permanent atrial fibrillation: Secondary | ICD-10-CM

## 2021-07-03 DIAGNOSIS — I712 Thoracic aortic aneurysm, without rupture: Secondary | ICD-10-CM | POA: Diagnosis not present

## 2021-07-03 DIAGNOSIS — I340 Nonrheumatic mitral (valve) insufficiency: Secondary | ICD-10-CM

## 2021-07-03 DIAGNOSIS — I428 Other cardiomyopathies: Secondary | ICD-10-CM | POA: Diagnosis not present

## 2021-07-03 DIAGNOSIS — R0789 Other chest pain: Secondary | ICD-10-CM

## 2021-07-03 DIAGNOSIS — I5022 Chronic systolic (congestive) heart failure: Secondary | ICD-10-CM

## 2021-07-03 DIAGNOSIS — I7121 Aneurysm of the ascending aorta, without rupture: Secondary | ICD-10-CM

## 2021-07-03 NOTE — Progress Notes (Signed)
Office Visit    Patient Name: Michael Delacruz Date of Encounter: 07/03/2021  Primary Care Provider:  [name], DO Primary Cardiologist:  Ida Rogue, MD  Chief Complaint    81 y/o ? with a history of tobacco abuse, COPD, severe mitral regurgitation status post mitral valve repair (November 2016), moderate pulmonary hypertension, atrial fibrillation/flutter status post permanent pacemaker and AV nodal ablation, nonischemic cardiomyopathy and HFrEF with an EF of 45-50% (March 2022), retinal bleeding exacerbated by oral anticoagulation requiring surgery x2 with residual right eye vision deficits, nonobstructive CAD, and HTN, who presents for f/u related to chest pain around his pacemaker site yesterday.  Past Medical History    Past Medical History:  Diagnosis Date   Anxiety    Arthritis    Ascending aortic aneurysm ([place])    a. 11/2018 Echo: Ao root 36mm, Asc Ao 62mm; b. 02/2021 Echo: Ao root 39mm; c. 04/2021 CT chest: Asc Ao 4.5cm.   Asthma    Bradycardia    CAD in native artery    a. LHC 09/2015: 40% pCx, 35% mRCA.   Chronic HFrEF (heart failure with reduced ejection fraction) (HCC)    COPD (chronic obstructive pulmonary disease) (HCC)    Dilation of intestine 01/2015   Fibromyalgia    Frequent headaches    GERD (gastroesophageal reflux disease)    History of blood clots    eye    History of hiatal hernia    History of kidney stones    History of rheumatic fever    Hypertension    NICM (nonischemic cardiomyopathy) ([place])    a. 07/2017 Echo: EF reduced to 40-45%; b. 07/2018 Echo: EF 25-30%; c. 11/2018 Echo: EF 30-35%; d. 02/2021 Echo: EF 45-50%, septal and apical septal HK. Mild MR. Sev dil LA. Mod dil RA. Ao root 66mm.   Permanent atrial fibrillation ([place])    a. s/p TEE/DCCV 07/2015. b. H/o bleeding on Coumadin when INR >5, changed to Eliquis-subsequently discontinued; c. 04/2018 s/p SJM 3562 Quadra Allure MP DC PPM & AVN ablation.   S/P Minimally invasive maze  operation for atrial fibrillation 10/22/2015   Complete bilateral atrial lesion set using cryothermy and bipolar radiofrequency ablation with clipping of LA appendage via right mini thoracotomy approach   S/P minimally invasive mitral valve repair 10/22/2015   Complex valvuloplasty including triangular resection of posterior leaflet, artificial Gore-tex neochord placement x6 and 38 mm Sorin Memo 3D Rechord ring annuloplasty via right minithoracotomy approach   Severe mitral regurgitation s/p MVR    a. s/p MV repair 10/2015; b. 11/2018 Echo: Mild MS (mean grad 33mmHg); d. 02/2021 Echo: Mild MR. Mean grad 66mmHg.   Sleep apnea    no longer uses cpap and doesn't use O2 at home   Stroke [facility])     no vision in right, on occasion sees a pinpoint    Thyroid disorder    TIA (transient ischemic attack)    Tobacco abuse    Past Surgical History:  Procedure Laterality Date   ANKLE SURGERY     AV NODE ABLATION N/A 05/04/2018   Procedure: AV NODE ABLATION;  Surgeon: Thompson Grayer, MD;  Location: [place] [facility];  Service: Cardiovascular;  Laterality: N/A;   BIV PACEMAKER INSERTION CRT-P N/A 05/03/2018   Procedure: BIV PACEMAKER INSERTION CRT-P;  Surgeon: Deboraha Sprang, MD;  Location: [place] [facility];  Service: Cardiovascular;  Laterality: N/A;   CARDIAC CATHETERIZATION N/A 10/03/2015   Procedure: Right and Left Heart Cath  and Coronary Angiography;  Surgeon: [name], MD;  Location: [place] [facility];  Service: Cardiovascular;  Laterality: N/A;   COLONOSCOPY     ELECTROPHYSIOLOGIC STUDY N/A 08/18/2015   Procedure: CARDIOVERSION;  Surgeon: Wellington Hampshire, MD;  Location: [facility] ORS;  Service: Cardiovascular;  Laterality: N/A;   MASS EXCISION N/A 03/04/2021   Procedure: EXCISION INNER NASAL CANCER;  Surgeon: Rozetta Nunnery, MD;  Location[facility];  Service: ENT;  Laterality: N/A;   MINIMALLY INVASIVE MAZE PROCEDURE N/A 10/22/2015   Procedure: MINIMALLY INVASIVE MAZE PROCEDURE;  Surgeon:  Rexene Alberts, MD;  Location: [place];  Service: Open Heart Surgery;  Laterality: N/A;   MITRAL VALVE REPAIR Right 10/22/2015   Procedure: MINIMALLY INVASIVE MITRAL VALVE REPAIR (MVR);  Surgeon: Rexene Alberts, MD;  Location: [place];  Service: Open Heart Surgery;  Laterality: Right;   SINUS EXPLORATION     SKIN FULL THICKNESS GRAFT N/A 03/04/2021   Procedure: SKIN GRAFT FULL THICKNESS;  Surgeon: Rozetta Nunnery, MD;  Location: [facility] OR;  Service: ENT;  Laterality: N/A;   TEE WITHOUT CARDIOVERSION N/A 08/18/2015   Procedure: TRANSESOPHAGEAL ECHOCARDIOGRAM (TEE);  Surgeon: Wellington Hampshire, MD;  Location: [facility] ORS;  Service: Cardiovascular;  Laterality: N/A;   TEE WITHOUT CARDIOVERSION N/A 10/22/2015   Procedure: TRANSESOPHAGEAL ECHOCARDIOGRAM (TEE);  Surgeon: Rexene Alberts, MD;  Location: [place];  Service: Open Heart Surgery;  Laterality: N/A;    Allergies  Allergies  Allergen Reactions   Codeine Nausea Only   Digoxin And Related     SOB, bad dreams   Macrodantin [Nitrofurantoin Macrocrystal] Rash   Morphine And Related Rash    History of Present Illness    80 y/o ? with above complex past medical history including nonobstructive CAD, severe mitral regurgitation status post mitral valve repair, moderate pulmonary hypertension, hypertension, atrial fibrillation/flutter status post permanent pacemaker and AV node ablation, dilated aorta, nonischemic cardiomyopathy and HFrEF, ongoing tobacco abuse, COPD, and retinal bleeding exacerbated by oral anticoagulation with ongoing right visual field deficits. In the setting of progressive mitral valve disease, he underwent diagnostic catheterization in 2016 revealing moderate nonobstructive left circumflex and RCA disease. He subsequently underwent successful mitral valve repair with surgical maze in the setting of a history of paroxysmal atrial fibrillation and flutter. He had previously been treated with amiodarone which was discontinued secondary to  nightmares, and oral anticoagulation, which was discontinued secondary to retinal bleeding requiring 2 surgeries (pt has preferred to remain off of Foley). As noted, he has ongoing right eye vision deficits. In 2018, he was found to have LV dysfunction with an EF of 40 to 45% in the setting of atrial flutter. This was difficult to rhythm and rate control and as result, he underwent successful permanent pacemaker placement and AV nodal ablation in May 2019. EF was measured at 25 to 30% in August 2019 but improved to 30-35% by echo in December 2019.  Most recent echo in March 2022 showed improvement in LV function to 45-50% with mild mitral regurgitation, and moderately dilated aortic root at 47 mm.  He subsequently underwent chest CT for pulmonary nodule screening and this showed ascending aortic aneurysm measuring 4.5 cm with recommendation for annual imaging.  He was last seen in cardiology clinic in May of this year, at which time he was feeling well and was euvolemic on examination.  For the most part, he has continued to feel well from a cardiac standpoint and does not typically experience chest  pain, dyspnea, palpitations, or edema.  Yesterday, around 10 AM, he noted a sharp chest pain medial and lateral to his pacemaker site that seem to worsen with certain shoulder movements as well as deep breathing.  This persisted throughout much of the day but by early evening began to wane and subsequently resolved completely.  He has had no recurrence.  He thinks maybe he was more short of breath than usual but this too has resolved.  His device was interrogated remotely yesterday and showed normal device function and normal OptiVol.  He denies palpitations, PND, orthopnea, dizziness, syncope, edema, or early satiety.  Home Medications    Current Outpatient Medications  Medication Sig Dispense Refill   busPIRone (BUSPAR) 5 MG tablet Take 1 tablet (5 mg total) by mouth 3 (three) times daily as needed (anxiety). 60  tablet 1   furosemide (LASIX) 40 MG tablet Take 1 tablet (40 mg total) by mouth daily as needed (As needed for weight gain or shortness of breath). 90 tablet 3   Homeopathic Products (HOMEOPATHIC CALM SL) Place under the tongue. Is using Doterra essential oil of Onguard ingestion daily     HYDROcodone-acetaminophen (HYCET) 7.5-325 mg/15 ml solution Take 10-15 mLs by mouth every 6 (six) hours as needed for moderate pain. 240 mL 0   ipratropium (ATROVENT) 0.06 % nasal spray Place 2 sprays into both nostrils 4 (four) times daily. As needed 15 mL 3   QUEtiapine (SEROQUEL) 25 MG tablet TAKE 1 TABLET BY MOUTH EVERYDAY AT BEDTIME 90 tablet 1   No current facility-administered medications for this visit.     Review of Systems    Sharp chest pain around his pacemaker site yesterday which is since resolved.  He denies angina, dyspnea, palpitations, PND, orthopnea, dizziness, syncope, edema, or early satiety.  All other systems reviewed and are otherwise negative except as noted above.  Physical Exam    VS:  BP 130/80 (BP Location: Left Arm, Patient Position: Sitting, Cuff Size: Normal)   Pulse 79   Ht 5\' 10"  (1.778 m)   Wt 178 lb (80.7 kg)   SpO2 98%   BMI 25.54 kg/m  , BMI Body mass index is 25.54 kg/m.     GEN: Well nourished, well developed, in no acute distress. HEENT: normal. Neck: Supple, no JVD, carotid bruits, or masses. Cardiac: RRR, no murmurs, rubs, or gallops. No clubbing, cyanosis, edema.  Radials 2+/PT 1+ and equal bilaterally.  Respiratory:  Respirations regular and unlabored, clear to auscultation bilaterally.  Chest wall is nontender.  Pacemaker site is without trauma, erythema, or ecchymosis. GI: Soft, nontender, nondistended, BS + x 4. MS: no deformity or atrophy. Skin: warm and dry, no rash. Neuro:  Strength and sensation are intact. Psych: Normal affect.  Accessory Clinical Findings    ECG personally reviewed by me today -V paced, 79, underlying atrial fibrillation -  no acute changes.  Lab Results  Component Value Date   WBC 4.7 03/04/2021   HGB 13.2 03/04/2021   HCT 40.1 03/04/2021   MCV 95.7 03/04/2021   PLT 165 03/04/2021   Lab Results  Component Value Date   CREATININE 1.12 03/04/2021   BUN 11 03/04/2021   NA 139 03/04/2021   K 3.2 (L) 03/04/2021   CL 104 03/04/2021   CO2 27 03/04/2021   Lab Results  Component Value Date   ALT 77 (H) 01/31/2021   AST 100 (H) 01/31/2021   ALKPHOS 81 01/31/2021   BILITOT 1.1 01/31/2021   Lab  Results  Component Value Date   CHOL 215 (H) 05/01/2018   HDL 42 05/01/2018   LDLCALC 157 (H) 05/01/2018   TRIG 79 05/01/2018   CHOLHDL 5.1 05/01/2018    Lab Results  Component Value Date   HGBA1C 5.5 05/01/2018    Assessment & Plan    1.  Noncardiac chest pain: Patient had sharp somewhat pleuritic chest pain medial and lateral to his pacemaker site yesterday, that was worse with deep breathing and certain movements of his left arm.  This has since resolved completely.  He does not think the area was tender yesterday.  He is nontender today and there is no evidence of trauma.  Device interrogation yesterday showed normal function and OptiVol.  We discussed potential etiology but ultimately feel that symptoms are musculoskeletal in nature.  He does admit to carrying about a 40 pound box the night before and he is not sure if maybe he upset something.  No further ischemic testing is warranted at this time.  I did advise if he has recurrent symptoms, that he should contact EMS for more immediate evaluation including ECG and subsequently lab work through the ER.  2.  Permanent atrial fibrillation/history of atrial flutter: Status post pacemaker placement and AV nodal ablation.  He is V paced.  He is not on anticoagulation secondary to prior history of ocular bleeding.  3.  Nonischemic cardiomyopathy/heart failure with midrange ejection fraction: Most recent echo in March of this year showed an EF of 45 to 50%.  He  is euvolemic on examination and has furosemide to be used as needed but has not required this.  He is not on any heart failure medications at his request.  4.  Ascending aortic aneurysm: 4.5 cm by CT in May.  Heart rate and blood pressure stable.  Annual follow-up.  5.  History of mitral regurgitation status post mitral valve repair: Only mild MR on echo in March.  6.  Disposition: Follow-up in clinic in 3 months or sooner if necessary.   Murray Hodgkins, NP 07/03/2021, 5:49 PM

## 2021-07-03 NOTE — Patient Instructions (Signed)
Medication Instructions:  No changes at this time.  *If you need a refill on your cardiac medications before your next appointment, please call your pharmacy*   Lab Work: None  If you have labs (blood work) drawn today and your tests are completely normal, you will receive your results only by: [place] (if you have MyChart) OR A paper copy in the mail If you have any lab test that is abnormal or we need to change your treatment, we will call you to review the results.   Testing/Procedures: None    Follow-Up: At [facility], you and your health needs are our priority.  As part of our continuing mission to provide you with exceptional heart care, we have created designated Provider Care Teams.  These Care Teams include your primary Cardiologist (physician) and Advanced Practice Providers (APPs -  Physician Assistants and Nurse Practitioners) who all work together to provide you with the care you need, when you need it.   Your next appointment:   3 month(s)  The format for your next appointment:   In Person  Provider:   Ida Rogue, MD or Murray Hodgkins, NP   Other Instructions Vital signs and EKG normal today with no cardiac related findings.

## 2021-07-13 ENCOUNTER — Ambulatory Visit (INDEPENDENT_AMBULATORY_CARE_PROVIDER_SITE_OTHER): Payer: Medicare HMO | Admitting: General Practice

## 2021-07-13 ENCOUNTER — Telehealth: Payer: Self-pay | Admitting: General Practice

## 2021-07-13 DIAGNOSIS — I5022 Chronic systolic (congestive) heart failure: Secondary | ICD-10-CM | POA: Diagnosis not present

## 2021-07-13 DIAGNOSIS — F331 Major depressive disorder, recurrent, moderate: Secondary | ICD-10-CM

## 2021-07-13 DIAGNOSIS — I5032 Chronic diastolic (congestive) heart failure: Secondary | ICD-10-CM | POA: Diagnosis not present

## 2021-07-13 DIAGNOSIS — Z8673 Personal history of transient ischemic attack (TIA), and cerebral infarction without residual deficits: Secondary | ICD-10-CM

## 2021-07-13 NOTE — Patient Instructions (Signed)
Visit Information  PATIENT GOALS:  Goals Addressed             This Visit's Progress    RNCM: Keep or Improve My Strength-Stroke       Timeframe:  Long-Range Goal Priority:  Medium Start Date:     01-15-2021                        Expected End Date:      07-08-2022                 Follow Up Date 09-14-2021   - eat healthy to increase strength - increase activity or exercise time a little every week - know who to call for help if I fall - learn how to get up if I fall - plan activity for times when energy is the highest    Why is this important?   Before the stroke you probably did not think much about being safe when you are up and about.  Now, it may be harder for you to get around.  It may also be easier for you to trip or fall.  It is common to have muscle weakness after a stroke. You may also feel like you cannot control an arm or leg.  It will be helpful to work with a physical therapist to get your strength and muscle control back.  It is good to stay as active as you can. Walking and stretching help you stay strong and flexible.  The physical therapist will develop an exercise program just for you.     Notes: Is concerned about not being able to walk in the near future. 07-13-2021: The patient is currently mobile. Will continue to monitor.      RNCM: Manage Fatigue (Stroke)       Timeframe:  Long-Range Goal Priority:  Medium Start Date:        01-15-2021                     Expected End Date:   07-08-2022                   Follow Up Date 09-14-2021   - balance periods of rest with activity as needed - develop a new routine to improve sleep - eat healthy - get at least 7 to 8 hours of sleep at night - get outdoors every day (weather permitting) - limit daytime naps - practice relaxation or meditation daily - use a fan or white noise in bedroom - use devices that will help like a cane, sock-puller or reacher    Why is this important?   Many people have problems  with extreme tiredness after a stroke. This is called poststroke fatigue.  Poststroke fatigue is not the same as the usual tiredness.  It does not always improve with rest.  It is not related to how busy or active you have been.  Many things can help you to manage it.    07-13-2021: Patient has memory loss due to stroke. The patient states he is managing his chronic conditions as best as he can.     RNCM: Track and Manage My Symptoms-Depression       Timeframe:  Long-Range Goal Priority:  High Start Date:      01-15-2021                       Expected End  Date:     07-08-2022                  Follow Up Date 07-14-2021   - avoid negative self-talk - develop a personal safety plan - develop a plan to deal with triggers like holidays, anniversaries - exercise at least 2 to 3 times per week - have a plan for how to handle bad days - journal feelings and what helps to feel better or worse - spend time or talk with others at least 2 to 3 times per week - spend time or talk with others every day - watch for early signs of feeling worse - write in journal every day    Why is this important?   Keeping track of your progress will help your treatment team find the right mix of medicine and therapy for you.  Write in your journal every day.  Day-to-day changes in depression symptoms are normal. It may be more helpful to check your progress at the end of each week instead of every day.     Notes: Discussed writing down goals to accomplish and taking small steps to meet those goals. 07-13-2021: The patient is struggling with depression. Able to talk about his feelings. Denies SI. Knows CCM team is available to assist with needs.         Patient verbalizes understanding of instructions provided today and agrees to view in [place].   Telephone follow up appointment with care management team member scheduled for: 09-14-2021 at 1 pm Noreene Larsson RN, MSN, [place] Mobile: [phone]
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
## 2021-07-28 ENCOUNTER — Ambulatory Visit (INDEPENDENT_AMBULATORY_CARE_PROVIDER_SITE_OTHER): Payer: Medicare HMO

## 2021-07-28 ENCOUNTER — Telehealth: Payer: Self-pay

## 2021-07-28 DIAGNOSIS — F330 Major depressive disorder, recurrent, mild: Secondary | ICD-10-CM | POA: Diagnosis not present

## 2021-07-28 DIAGNOSIS — R112 Nausea with vomiting, unspecified: Secondary | ICD-10-CM | POA: Diagnosis not present

## 2021-07-28 DIAGNOSIS — Z8673 Personal history of transient ischemic attack (TIA), and cerebral infarction without residual deficits: Secondary | ICD-10-CM | POA: Diagnosis not present

## 2021-07-28 DIAGNOSIS — R197 Diarrhea, unspecified: Secondary | ICD-10-CM | POA: Diagnosis not present

## 2021-07-28 DIAGNOSIS — D692 Other nonthrombocytopenic purpura: Secondary | ICD-10-CM | POA: Diagnosis not present

## 2021-07-28 DIAGNOSIS — I442 Atrioventricular block, complete: Secondary | ICD-10-CM

## 2021-07-28 DIAGNOSIS — Z8669 Personal history of other diseases of the nervous system and sense organs: Secondary | ICD-10-CM | POA: Diagnosis not present

## 2021-07-28 LAB — CUP PACEART REMOTE DEVICE CHECK
Battery Remaining Longevity: 59 mo
Battery Remaining Percentage: 67 %
Battery Voltage: 2.99 V
Date Time Interrogation Session: 20220809105236
Implantable Lead Implant Date: 20190515
Implantable Lead Implant Date: 20190515
Implantable Lead Location: 753858
Implantable Lead Location: 753860
Implantable Lead Model: 5076
Implantable Pulse Generator Implant Date: 20190515
Lead Channel Impedance Value: 510 Ohm
Lead Channel Impedance Value: 540 Ohm
Lead Channel Pacing Threshold Amplitude: 0.5 V
Lead Channel Pacing Threshold Amplitude: 1 V
Lead Channel Pacing Threshold Pulse Width: 0.5 ms
Lead Channel Pacing Threshold Pulse Width: 0.5 ms
Lead Channel Sensing Intrinsic Amplitude: 8.5 mV
Lead Channel Setting Pacing Amplitude: 2 V
Lead Channel Setting Pacing Amplitude: 2.5 V
Lead Channel Setting Pacing Pulse Width: 0.5 ms
Lead Channel Setting Pacing Pulse Width: 0.5 ms
Lead Channel Setting Sensing Sensitivity: 2 mV
Pulse Gen Model: 3562
Pulse Gen Serial Number: 9427031

## 2021-07-29 ENCOUNTER — Telehealth: Payer: Self-pay | Admitting: Licensed Clinical Social Worker

## 2021-07-29 NOTE — Telephone Encounter (Signed)
    Clinical Social Work  Chronic Care Management   Phone Outreach    07/29/2021 Name: AMITH NOBLETT MRN: [id number] DOB: [birth year]  Michael Delacruz is a 80 y.o. year old male who is a primary care patient of [name], DO .   F/U phone call today to assess needs, progress and barriers with care plan goals.   Telephone outreach was unsuccessful A HIPPA compliant phone message was left for the patient providing contact information and requesting a return call.   Plan:CCM LCSW will wait for return call. If no return call is received, Will reach out to patient again in the next 30 days .   Review of patient status, including review of consultants reports, relevant laboratory and other test results, and collaboration with appropriate care team members and the patient's provider was performed as part of comprehensive patient evaluation and provision of care management services.    Christa See, MSW, [place] [facility] [email or web address] Phone [phone] 12:36 PM

## 2021-07-30 DIAGNOSIS — F330 Major depressive disorder, recurrent, mild: Secondary | ICD-10-CM | POA: Diagnosis not present

## 2021-07-30 DIAGNOSIS — Z8719 Personal history of other diseases of the digestive system: Secondary | ICD-10-CM | POA: Diagnosis not present

## 2021-08-03 ENCOUNTER — Ambulatory Visit (INDEPENDENT_AMBULATORY_CARE_PROVIDER_SITE_OTHER): Payer: Medicare HMO

## 2021-08-03 DIAGNOSIS — I5022 Chronic systolic (congestive) heart failure: Secondary | ICD-10-CM | POA: Diagnosis not present

## 2021-08-03 DIAGNOSIS — Z950 Presence of cardiac pacemaker: Secondary | ICD-10-CM | POA: Diagnosis not present

## 2021-08-04 NOTE — Progress Notes (Signed)
EPIC Encounter for ICM Monitoring  Patient Name: Michael Delacruz is a 80 y.o. male Date: 08/04/2021 Primary Care Physican: [name], DO Primary Cardiologist: [name] Electrophysiologist: Vergie Living Pacing: 94%            08/04/2021 Weight: 177-178 lbs                                                            Spoke with patient and heart failure questions reviewed.  Pt asymptomatic for fluid accumulation and feeling well.   CorVue thoracic impedance normal.   Prescribed: Furosemide 40 mg Take 1 tablet (40 mg total) by mouth daily as needed (As needed for weight gain or shortness of breath).   Recommendations:  No changes and encouraged to call if experiencing any fluid symptoms.   Follow-up plan: ICM clinic phone appointment on 09/07/2021.   91 day device clinic remote transmission 10/27/2021.     EP/Cardiology Office Visits:  Recall 11/25/2021 with Dr. [name].  09/22/2021 with Dr Caryl Comes.    Copy of ICM check sent to Dr. Caryl Comes.    3 month ICM trend: 08/03/2021.    1 Year ICM trend:       [name], RN 08/04/2021 4:15 PM

## 2021-08-19 NOTE — Progress Notes (Signed)
Remote pacemaker transmission.   

## 2021-09-07 ENCOUNTER — Ambulatory Visit (INDEPENDENT_AMBULATORY_CARE_PROVIDER_SITE_OTHER): Payer: Medicare HMO

## 2021-09-07 DIAGNOSIS — I5020 Unspecified systolic (congestive) heart failure: Secondary | ICD-10-CM | POA: Diagnosis not present

## 2021-09-07 DIAGNOSIS — Z950 Presence of cardiac pacemaker: Secondary | ICD-10-CM

## 2021-09-08 NOTE — Progress Notes (Signed)
EPIC Encounter for ICM Monitoring  Patient Name: Michael Delacruz is a 80 y.o. male Date: 09/08/2021 Primary Care Physican: [name], DO Primary Cardiologist: [name] Electrophysiologist: Vergie Living Pacing: 94%            09/08/2021 Weight: 170 lbs                                                            Spoke with patient and heart failure questions reviewed.  Pt asymptomatic for fluid accumulation and feeling well.   CorVue thoracic impedance suggesting normal fluid levels.   Prescribed: Furosemide 40 mg Take 1 tablet (40 mg total) by mouth daily as needed (As needed for weight gain or shortness of breath).   Recommendations:  No changes and encouraged to call if experiencing any fluid symptoms.   Follow-up plan: ICM clinic phone appointment on 10/12/2021.   91 day device clinic remote transmission 10/27/2021.     EP/Cardiology Office Visits:  Recall 11/25/2021 with Dr. [name].  09/22/2021 with Dr Caryl Comes.    Copy of ICM check sent to Dr. Caryl Comes.     3 month ICM trend: 09/07/2021.    1 Year ICM trend:       [name], RN 09/08/2021 10:20 AM

## 2021-09-11 ENCOUNTER — Other Ambulatory Visit: Payer: Self-pay | Admitting: Family Medicine

## 2021-09-11 DIAGNOSIS — F411 Generalized anxiety disorder: Secondary | ICD-10-CM

## 2021-09-11 DIAGNOSIS — F331 Major depressive disorder, recurrent, moderate: Secondary | ICD-10-CM

## 2021-09-11 DIAGNOSIS — G4701 Insomnia due to medical condition: Secondary | ICD-10-CM

## 2021-09-11 NOTE — Telephone Encounter (Signed)
Requested medications are due for refill today yes  Requested medications are on the active medication list yes  Last refill 06/11/21  Last visit 01/2021  Future visit scheduled 09/14/21  Notes to clinic Not Delegated.

## 2021-09-14 ENCOUNTER — Ambulatory Visit (INDEPENDENT_AMBULATORY_CARE_PROVIDER_SITE_OTHER): Payer: Medicare HMO

## 2021-09-14 ENCOUNTER — Telehealth: Payer: Medicare HMO | Admitting: General Practice

## 2021-09-14 DIAGNOSIS — F331 Major depressive disorder, recurrent, moderate: Secondary | ICD-10-CM

## 2021-09-14 DIAGNOSIS — I5022 Chronic systolic (congestive) heart failure: Secondary | ICD-10-CM

## 2021-09-14 DIAGNOSIS — I5032 Chronic diastolic (congestive) heart failure: Secondary | ICD-10-CM

## 2021-09-14 DIAGNOSIS — Z8673 Personal history of transient ischemic attack (TIA), and cerebral infarction without residual deficits: Secondary | ICD-10-CM

## 2021-09-14 DIAGNOSIS — F411 Generalized anxiety disorder: Secondary | ICD-10-CM
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
## 2021-09-14 NOTE — Patient Instructions (Signed)
Visit Information  PATIENT GOALS:  Goals Addressed             This Visit's Progress    RNCM: Keep or Improve My Strength-Stroke       Timeframe:  Long-Range Goal Priority:  Medium Start Date:     01-15-2021                        Expected End Date:      07-08-2022                 Follow Up Date: 11-16-2021   - eat healthy to increase strength - increase activity or exercise time a little every week - know who to call for help if I fall - learn how to get up if I fall - plan activity for times when energy is the highest    Why is this important?   Before the stroke you probably did not think much about being safe when you are up and about.  Now, it may be harder for you to get around.  It may also be easier for you to trip or fall.  It is common to have muscle weakness after a stroke. You may also feel like you cannot control an arm or leg.  It will be helpful to work with a physical therapist to get your strength and muscle control back.  It is good to stay as active as you can. Walking and stretching help you stay strong and flexible.  The physical therapist will develop an exercise program just for you.     Notes: Is concerned about not being able to walk in the near future. 09-14-2021: The patient is currently mobile. Will continue to monitor.      RNCM: Manage Fatigue (Stroke)       Timeframe:  Long-Range Goal Priority:  Medium Start Date:        01-15-2021                     Expected End Date:   07-08-2022                   Follow Up Date 11-16-2021   - balance periods of rest with activity as needed - develop a new routine to improve sleep - eat healthy - get at least 7 to 8 hours of sleep at night - get outdoors every day (weather permitting) - limit daytime naps - practice relaxation or meditation daily - use a fan or white noise in bedroom - use devices that will help like a cane, sock-puller or reacher    Why is this important?   Many people have problems  with extreme tiredness after a stroke. This is called poststroke fatigue.  Poststroke fatigue is not the same as the usual tiredness.  It does not always improve with rest.  It is not related to how busy or active you have been.  Many things can help you to manage it.    09-14-2021: Patient has memory loss due to stroke. The patient states he is managing his chronic conditions as best as he can.     RNCM: Track and Manage My Symptoms-Depression       Timeframe:  Long-Range Goal Priority:  High Start Date:      01-15-2021                       Expected End  Date:     07-08-2022                  Follow Up Date 11-16-2021   - avoid negative self-talk - develop a personal safety plan - develop a plan to deal with triggers like holidays, anniversaries - exercise at least 2 to 3 times per week - have a plan for how to handle bad days - journal feelings and what helps to feel better or worse - spend time or talk with others at least 2 to 3 times per week - spend time or talk with others every day - watch for early signs of feeling worse - write in journal every day    Why is this important?   Keeping track of your progress will help your treatment team find the right mix of medicine and therapy for you.  Write in your journal every day.  Day-to-day changes in depression symptoms are normal. It may be more helpful to check your progress at the end of each week instead of every day.     Notes: Discussed writing down goals to accomplish and taking small steps to meet those goals. 07-13-2021: The patient is struggling with depression. Able to talk about his feelings. Denies SI. Knows CCM team is available to assist with needs. 09-14-2021: The patient was in a better mood today. Celebrated his granddaughters first birthday this weekend. She is what makes him happy.  He states he is stable and takes it one day at a time. Denies any acute distress at this time. Will continue to monitor.          Patient verbalizes understanding of instructions provided today and agrees to view in [place].   Telephone follow up appointment with care management team member scheduled for: 11-16-2021 at 1 pm  Noreene Larsson RN, MSN, [place] Mobile: [phone]

## 2021-09-18 DIAGNOSIS — I5032 Chronic diastolic (congestive) heart failure: Secondary | ICD-10-CM

## 2021-09-18 DIAGNOSIS — F331 Major depressive disorder, recurrent, moderate: Secondary | ICD-10-CM | POA: Diagnosis not present

## 2021-09-18 DIAGNOSIS — I5022 Chronic systolic (congestive) heart failure: Secondary | ICD-10-CM

## 2021-09-22 ENCOUNTER — Ambulatory Visit (INDEPENDENT_AMBULATORY_CARE_PROVIDER_SITE_OTHER): Payer: Medicare HMO | Admitting: Internal Medicine

## 2021-09-22 ENCOUNTER — Encounter: Payer: Self-pay | Admitting: Internal Medicine

## 2021-09-22 ENCOUNTER — Other Ambulatory Visit: Payer: Self-pay

## 2021-09-22 VITALS — BP 126/80 | HR 82 | Ht 70.0 in | Wt 174.0 lb

## 2021-09-22 DIAGNOSIS — I428 Other cardiomyopathies: Secondary | ICD-10-CM

## 2021-09-22 DIAGNOSIS — Z950 Presence of cardiac pacemaker: Secondary | ICD-10-CM | POA: Diagnosis not present

## 2021-09-22 DIAGNOSIS — I5020 Unspecified systolic (congestive) heart failure: Secondary | ICD-10-CM

## 2021-09-22 DIAGNOSIS — I442 Atrioventricular block, complete: Secondary | ICD-10-CM | POA: Diagnosis not present

## 2021-09-22 LAB — CUP PACEART INCLINIC DEVICE CHECK
Battery Remaining Longevity: 55 mo
Battery Voltage: 2.99 V
Brady Statistic RA Percent Paced: 0 %
Brady Statistic RV Percent Paced: 94 %
Date Time Interrogation Session: 20221004165015
Implantable Lead Implant Date: 20190515
Implantable Lead Implant Date: 20190515
Implantable Lead Location: 753858
Implantable Lead Location: 753860
Implantable Lead Model: 5076
Implantable Pulse Generator Implant Date: 20190515
Lead Channel Impedance Value: 512.5 Ohm
Lead Channel Impedance Value: 550 Ohm
Lead Channel Pacing Threshold Amplitude: 0.5 V
Lead Channel Pacing Threshold Amplitude: 0.5 V
Lead Channel Pacing Threshold Amplitude: 0.75 V
Lead Channel Pacing Threshold Amplitude: 0.75 V
Lead Channel Pacing Threshold Pulse Width: 0.5 ms
Lead Channel Pacing Threshold Pulse Width: 0.5 ms
Lead Channel Pacing Threshold Pulse Width: 0.5 ms
Lead Channel Pacing Threshold Pulse Width: 0.5 ms
Lead Channel Sensing Intrinsic Amplitude: 7 mV
Lead Channel Setting Pacing Amplitude: 2 V
Lead Channel Setting Pacing Amplitude: 2.5 V
Lead Channel Setting Pacing Pulse Width: 0.5 ms
Lead Channel Setting Pacing Pulse Width: 0.5 ms
Lead Channel Setting Sensing Sensitivity: 2 mV
Pulse Gen Model: 3562
Pulse Gen Serial Number: 9427031

## 2021-09-22 LAB — PACEMAKER DEVICE OBSERVATION

## 2021-09-22 NOTE — Progress Notes (Signed)
Patient Care Team: [name], DO as PCP - General (Family Medicine) [name], MD as PCP - Cardiology (Cardiology) [name], MD as Consulting Physician (Cardiology) Vanita Ingles, RN as Case Manager (General Practice) Rebekah Chesterfield, LCSW as Social Worker (Licensed Clinical Social Worker)   HPI  Michael Delacruz is a 80 y.o. male seen in follow-up for persistent/permanent atrial fibrillation/flutter with a rapid ventricular response and interval deterioration of LV systolic function    DATE TEST EF     8/16    Echo  55-60 % Severe MR 2/2 post leaflet prolapse  12/16    Echo  55-60 % Mild Aortic root dilitation MR mild   8/18 Echo  45-50%    5/19 Echo  20-25%   8/19 Echo  25-30%          He remains disinclined for anticoagulation.  Atrial fibrillation is now permanent  5/19 he presented with acute systolic heart failure with symptoms of impending doom.  He was in atrial flutter with uncontrolled rate.  He was at that juncture willing and underwent CRT-P and AV junction ablation.  I did his CRT-P Dr. Greggory Brandy did his AV ablation  Continues to complain of leg weakness without leg pain.  No chest pain or dyspnea.  He is really weak and spending time recently in bed.  COVID about a year ago.  DATE TEST   9/18 Holter      Mean 123 (94-135)  12/18 Holter Mean HR 112 (94-135)          Date Cr K TSH ALT   10/18 1.23 3.4 5.31(8/18)  12  10/18 1..44 3.9 9.9>>8.7   5/19 0.97 3.9           Attempted at last visit to use Entresto.  Failed because of orthostasis.     Past Medical History:  Diagnosis Date   Anxiety    Arthritis    Ascending aortic aneurysm    a. 11/2018 Echo: Ao root 6mm, Asc Ao 41mm; b. 02/2021 Echo: Ao root 32mm; c. 04/2021 CT chest: Asc Ao 4.5cm.   Asthma    Bradycardia    CAD in native artery    a. LHC 09/2015: 40% pCx, 35% mRCA.   Chronic HFrEF (heart failure with reduced ejection fraction) (HCC)    COPD (chronic  obstructive pulmonary disease) (HCC)    Dilation of intestine 01/2015   Fibromyalgia    Frequent headaches    GERD (gastroesophageal reflux disease)    History of blood clots    eye    History of hiatal hernia    History of kidney stones    History of rheumatic fever    Hypertension    NICM (nonischemic cardiomyopathy) ([place])    a. 07/2017 Echo: EF reduced to 40-45%; b. 07/2018 Echo: EF 25-30%; c. 11/2018 Echo: EF 30-35%; d. 02/2021 Echo: EF 45-50%, septal and apical septal HK. Mild MR. Sev dil LA. Mod dil RA. Ao root 70mm.   Permanent atrial fibrillation ([place])    a. s/p TEE/DCCV 07/2015. b. H/o bleeding on Coumadin when INR >5, changed to Eliquis-subsequently discontinued; c. 04/2018 s/p SJM 3562 Quadra Allure MP DC PPM & AVN ablation.   S/P Minimally invasive maze operation for atrial fibrillation 10/22/2015   Complete bilateral atrial lesion set using cryothermy and bipolar radiofrequency ablation with clipping of LA appendage via right mini thoracotomy approach   S/P minimally invasive mitral valve repair 10/22/2015  Complex valvuloplasty including triangular resection of posterior leaflet, artificial Gore-tex neochord placement x6 and 38 mm Sorin Memo 3D Rechord ring annuloplasty via right minithoracotomy approach   Severe mitral regurgitation s/p MVR    a. s/p MV repair 10/2015; b. 11/2018 Echo: Mild MS (mean grad 48mmHg); d. 02/2021 Echo: Mild MR. Mean grad 3mmHg.   Sleep apnea    no longer uses cpap and doesn't use O2 at home   Stroke [facility])     no vision in right, on occasion sees a pinpoint    Thyroid disorder    TIA (transient ischemic attack)    Tobacco abuse     Past Surgical History:  Procedure Laterality Date   ANKLE SURGERY     AV NODE ABLATION N/A 05/04/2018   Procedure: AV NODE ABLATION;  Surgeon: Thompson Grayer, MD;  Location: [place] [facility];  Service: Cardiovascular;  Laterality: N/A;   BIV PACEMAKER INSERTION CRT-P N/A 05/03/2018   Procedure: BIV PACEMAKER INSERTION  CRT-P;  Surgeon: Deboraha Sprang, MD;  Location: [place] [facility];  Service: Cardiovascular;  Laterality: N/A;   CARDIAC CATHETERIZATION N/A 10/03/2015   Procedure: Right and Left Heart Cath and Coronary Angiography;  Surgeon: [name], MD;  Location: [place] [facility];  Service: Cardiovascular;  Laterality: N/A;   COLONOSCOPY     ELECTROPHYSIOLOGIC STUDY N/A 08/18/2015   Procedure: CARDIOVERSION;  Surgeon: Wellington Hampshire, MD;  Location: [facility] OR[facility];  Service: Cardiovascular;  Laterality: N/A;   MASS EXCISION N/A 03/04/2021   Procedure: EXCISION INNER NASAL CANCER;  Surgeon: Rozetta Nunnery, MD;  Location: [facility] OR;  Service: ENT;  Laterality: N/A;   MINIMALLY INVASIVE MAZE PROCEDURE N/A 10/22/2015   Procedure: MINIMALLY INVASIVE MAZE PROCEDURE;  Surgeon: Rexene Alberts, MD;  Location: [place];  Service: Open Heart Surgery;  Laterality: N/A;   MITRAL VALVE REPAIR Right 10/22/2015   Procedure: MINIMALLY INVASIVE MITRAL VALVE REPAIR (MVR);  Surgeon: Rexene Alberts, MD;  Location: [place];  Service: Open Heart Surgery;  Laterality: Right;   SINUS EXPLORATION     SKIN FULL THICKNESS GRAFT N/A 03/04/2021   Procedure: SKIN GRAFT FULL THICKNESS;  Surgeon: Rozetta Nunnery, MD;  Location: [facility] OR;  Service: ENT;  Laterality: N/A;   TEE WITHOUT CARDIOVERSION N/A 08/18/2015   Procedure: TRANSESOPHAGEAL ECHOCARDIOGRAM (TEE);  Surgeon: Wellington Hampshire, MD;  Location: [facility] ORS;  Service: Cardiovascular;  Laterality: N/A;   TEE WITHOUT CARDIOVERSION N/A 10/22/2015   Procedure: TRANSESOPHAGEAL ECHOCARDIOGRAM (TEE);  Surgeon: Rexene Alberts, MD;  Location: [place];  Service: Open Heart Surgery;  Laterality: N/A;    Current Outpatient Medications  Medication Sig Dispense Refill   busPIRone (BUSPAR) 5 MG tablet Take 1 tablet (5 mg total) by mouth 3 (three) times daily as needed (anxiety). 60 tablet 1   furosemide (LASIX) 40 MG tablet Take 1 tablet (40 mg total) by mouth daily as needed (As needed for  weight gain or shortness of breath). 90 tablet 3   Homeopathic Products (HOMEOPATHIC CALM SL) Place under the tongue. Is using Doterra essential oil of Onguard ingestion daily     ipratropium (ATROVENT) 0.06 % nasal spray Place 2 sprays into both nostrils 4 (four) times daily. As needed 15 mL 3   QUEtiapine (SEROQUEL) 25 MG tablet TAKE 1 TABLET BY MOUTH EVERYDAY AT BEDTIME 90 tablet 1   No current facility-administered medications for this visit.    Allergies  Allergen Reactions   Codeine Nausea Only  Digoxin And Related     SOB, bad dreams   Macrodantin [Nitrofurantoin Macrocrystal] Rash   Morphine And Related Rash      Review of Systems negative except from HPI and PMH  Physical Exam BP 126/80 (BP Location: Left Arm, Patient Position: Sitting, Cuff Size: Normal)   Pulse 82   Ht 5\' 10"  (1.778 m)   Wt 174 lb (78.9 kg)   SpO2 98%   BMI 24.97 kg/m  Well developed and well nourished in no acute distress HENT normal Neck supple with JVP-flat Clear Device pocket well healed; without hematoma or erythema.  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  We will continue on an as-needed basis.  Hypotension has preclude guideline directed therapy.  For cardiomyopathy No anticoagulation per patient choice.  Increasingly debilitated.  Have encouraged him to exercise, consider CUBII and stay out of lying in bed

## 2021-09-22 NOTE — Patient Instructions (Signed)

## 2021-10-05 ENCOUNTER — Other Ambulatory Visit: Payer: Self-pay

## 2021-10-05 ENCOUNTER — Encounter: Payer: Self-pay | Admitting: Cardiovascular Disease

## 2021-10-05 ENCOUNTER — Ambulatory Visit (INDEPENDENT_AMBULATORY_CARE_PROVIDER_SITE_OTHER): Payer: Medicare HMO | Admitting: Cardiovascular Disease

## 2021-10-05 VITALS — BP 130/78 | HR 82 | Ht 70.0 in | Wt 176.2 lb

## 2021-10-05 DIAGNOSIS — I4821 Permanent atrial fibrillation: Secondary | ICD-10-CM

## 2021-10-05 DIAGNOSIS — I7121 Aneurysm of the ascending aorta, without rupture: Secondary | ICD-10-CM

## 2021-10-05 DIAGNOSIS — I272 Pulmonary hypertension, unspecified: Secondary | ICD-10-CM | POA: Diagnosis not present

## 2021-10-05 DIAGNOSIS — I442 Atrioventricular block, complete: Secondary | ICD-10-CM | POA: Diagnosis not present

## 2021-10-05 DIAGNOSIS — Z950 Presence of cardiac pacemaker: Secondary | ICD-10-CM

## 2021-10-05 DIAGNOSIS — I340 Nonrheumatic mitral (valve) insufficiency: Secondary | ICD-10-CM

## 2021-10-05 DIAGNOSIS — I428 Other cardiomyopathies: Secondary | ICD-10-CM

## 2021-10-05 DIAGNOSIS — Z9889 Other specified postprocedural states: Secondary | ICD-10-CM | POA: Diagnosis not present

## 2021-10-05 DIAGNOSIS — I5020 Unspecified systolic (congestive) heart failure: Secondary | ICD-10-CM

## 2021-10-05 NOTE — Patient Instructions (Addendum)
Medication Instructions:  No changes  If you need a refill on your cardiac medications before your next appointment, please call your pharmacy.   Lab work: No new labs needed  Testing/Procedures: ECHO (spring of 2023)  Your physician has requested that you have an echocardiogram. Echocardiography is a painless test that uses sound waves to create images of your heart. It provides your doctor with information about the size and shape of your heart and how well your heart's chambers and valves are working. This procedure takes approximately one hour. There are no restrictions for this procedure.   Follow-Up: At [facility], you and your health needs are our priority.  As part of our continuing mission to provide you with exceptional heart care, we have created designated Provider Care Teams.  These Care Teams include your primary Cardiologist (physician) and Advanced Practice Providers (APPs -  Physician Assistants and Nurse Practitioners) who all work together to provide you with the care you need, when you need it.  You will need a follow up appointment in 6 months  Providers on your designated Care Team:   Murray Hodgkins, NP Christell Faith, PA-C Marrianne Mood, PA-C [place], [place]  COVID-19 Vaccine Information can be found at: [email or web address] For questions related to vaccine distribution or appointments, please email [email or web address] or call [phone].  '

## 2021-10-05 NOTE — Progress Notes (Signed)
Evaluation Performed:  Follow-up visit  Date:  10/05/2021   ID:  Michael Delacruz, DOB 1941-11-17, MRN [id number]  Patient Location:  [place] [id number]   Provider location:   [facility], [place] office  PCP:  [name], DO  Cardiologist:  Patsy Baltimore  Chief Complaint  Patient presents with   3 month follow up     "Doing well." Medications reviewed by the patient verbally.     History of Present Illness:    Michael Delacruz is a 80 y.o. male  past medical history of long smoking history for 50 years with underlying COPD,  severe mitral valve regurgitation on echocardiogram,  prolapse of posterior leaflet,  moderate pulmonary hypertension  s/p successful MR repair by right thoracotomy by Dr. Roxy Manns 11/ 2016 Smokes 1 ppd PAF on warfarin had bleeding, now on asa, s/p Maze Surgical report indicates clipping of left atrial appendage, Atricure left atrial clip, size 45 mm Previous history of retinal bleeding felt exacerbated by warfarin , requiring surgery 2 residual right eye  vision deficits Atrial flutter EF 40 to 45% Dilated ascending aorta 4.5 cm on CT Who presents for follow up of his MR repair and atrial flutter  LOV 04/2021 Lost vision on right, chronic issue Vision on left comes and goes, no rhyme or reason  Trouble walking first thing in AM Better after few hours, after he warms up  Continues to smoke, no desire to quit Walked track at [employer], "almost killed me" Does not do regular walking program likes deconditioned  Denies any shortness of breath, leg swelling PND orthopnea abdominal distention  Echo 02/2021 reviewed eft ventricular ejection fraction, by estimation, is 45 to 50%. The  left ventricle has mildly decreased function. The left ventricle  demonstrates regional wall motion abnormalities (Septal and apical septal  hypokinesis, possibly secondary to  conduction abnormality).  Left ventricular diastolic parameters are  indeterminate.   2. Right ventricular systolic function is normal. The right ventricular  size is normal.   3. The mitral valve was not well visualized. Mitral valve repair noted.  Mild mitral valve regurgitation. No evidence of mitral stenosis. The mean  mitral valve gradient is 5.0 mmHg.   4. Left atrial size was severely dilated.   5. Right atrial size was moderately dilated.   6. There is moderate dilatation of the aortic root, measuring 47 mm.  Ascending aorta not well visualized.   EKG personally reviewed by myself on todays visit Paced rhythm with rate 82 bpm  Other past medical history reviewed covid 19 early 2022 associated severe weakness, restlessness, and altered mental status.  Previous fall in the shower at one point and struck the left side of his upper chest associated with severe pain.  Evaluation in the ER  CT chest 04/29/2021 4.5 cm ascending thoracic aortic aneurysm  Poor appetite, lost 30 pounds  Chronic vision issue No vision in right eye,  Has  CRT-P and AV junction ablation  Other past medical history reviewed Previously declined anticoagulation and cardioversion  He was started on amiodarone as a class IIb indication for rate control   amio has been discontinued because of nightmares.     May 5188 acute systolic heart failure with symptoms of impending doom.   He was in atrial flutter with uncontrolled rate.    underwent CRT-P and AV junction ablation.    Failed Entresto in the past secondary to orthostasis  Past Medical History:  Diagnosis Date   Anxiety    Arthritis    Ascending aortic aneurysm    a. 11/2018 Echo: Ao root 46mm, Asc Ao 32mm; b. 02/2021 Echo: Ao root 57mm; c. 04/2021 CT chest: Asc Ao 4.5cm.   Asthma    Bradycardia    CAD in native artery    a. LHC 09/2015: 40% pCx, 35% mRCA.   Chronic HFrEF (heart failure with reduced ejection fraction) (HCC)    COPD (chronic obstructive  pulmonary disease) (HCC)    Dilation of intestine 01/2015   Fibromyalgia    Frequent headaches    GERD (gastroesophageal reflux disease)    History of blood clots    eye    History of hiatal hernia    History of kidney stones    History of rheumatic fever    Hypertension    NICM (nonischemic cardiomyopathy) ([place])    a. 07/2017 Echo: EF reduced to 40-45%; b. 07/2018 Echo: EF 25-30%; c. 11/2018 Echo: EF 30-35%; d. 02/2021 Echo: EF 45-50%, septal and apical septal HK. Mild MR. Sev dil LA. Mod dil RA. Ao root 10mm.   Permanent atrial fibrillation ([place])    a. s/p TEE/DCCV 07/2015. b. H/o bleeding on Coumadin when INR >5, changed to Eliquis-subsequently discontinued; c. 04/2018 s/p SJM 3562 Quadra Allure MP DC PPM & AVN ablation.   S/P Minimally invasive maze operation for atrial fibrillation 10/22/2015   Complete bilateral atrial lesion set using cryothermy and bipolar radiofrequency ablation with clipping of LA appendage via right mini thoracotomy approach   S/P minimally invasive mitral valve repair 10/22/2015   Complex valvuloplasty including triangular resection of posterior leaflet, artificial Gore-tex neochord placement x6 and 38 mm Sorin Memo 3D Rechord ring annuloplasty via right minithoracotomy approach   Severe mitral regurgitation s/p MVR    a. s/p MV repair 10/2015; b. 11/2018 Echo: Mild MS (mean grad 24mmHg); d. 02/2021 Echo: Mild MR. Mean grad 21mmHg.   Sleep apnea    no longer uses cpap and doesn't use O2 at home   Stroke [facility])     no vision in right, on occasion sees a pinpoint    Thyroid disorder    TIA (transient ischemic attack)    Tobacco abuse    Past Surgical History:  Procedure Laterality Date   ANKLE SURGERY     AV NODE ABLATION N/A 05/04/2018   Procedure: AV NODE ABLATION;  Surgeon: Thompson Grayer, MD;  Location: [place] [facility];  Service: Cardiovascular;  Laterality: N/A;   BIV PACEMAKER INSERTION CRT-P N/A 05/03/2018   Procedure: BIV PACEMAKER INSERTION CRT-P;   Surgeon: Deboraha Sprang, MD;  Location: [place] [facility];  Service: Cardiovascular;  Laterality: N/A;   CARDIAC CATHETERIZATION N/A 10/03/2015   Procedure: Right and Left Heart Cath and Coronary Angiography;  Surgeon: [name], MD;  Location: [place] [facility];  Service: Cardiovascular;  Laterality: N/A;   COLONOSCOPY     ELECTROPHYSIOLOGIC STUDY N/A 08/18/2015   Procedure: CARDIOVERSION;  Surgeon: Wellington Hampshire, MD;  Location: [facility] ORS;  Service: Cardiovascular;  Laterality: N/A;   MASS EXCISION N/A 03/04/2021   Procedure: EXCISION INNER NASAL CANCER;  Surgeon: Rozetta Nunnery, MD;  Location: [facility] OR;  Service: ENT;  Laterality: N/A;   MINIMALLY INVASIVE MAZE PROCEDURE N/A 10/22/2015   Procedure: MINIMALLY INVASIVE MAZE PROCEDURE;  Surgeon: Rexene Alberts, MD;  Location: [place];  Service: Open Heart Surgery;  Laterality: N/A;   MITRAL VALVE REPAIR Right  10/22/2015   Procedure: MINIMALLY INVASIVE MITRAL VALVE REPAIR (MVR);  Surgeon: Rexene Alberts, MD;  Location: [place];  Service: Open Heart Surgery;  Laterality: Right;   SINUS EXPLORATION     SKIN FULL THICKNESS GRAFT N/A 03/04/2021   Procedure: SKIN GRAFT FULL THICKNESS;  Surgeon: Rozetta Nunnery, MD;  Location: [facility] OR;  Service: ENT;  Laterality: N/A;   TEE WITHOUT CARDIOVERSION N/A 08/18/2015   Procedure: TRANSESOPHAGEAL ECHOCARDIOGRAM (TEE);  Surgeon: Wellington Hampshire, MD;  Location: [facility] OR[facility];  Service: Cardiovascular;  Laterality: N/A;   TEE WITHOUT CARDIOVERSION N/A 10/22/2015   Procedure: TRANSESOPHAGEAL ECHOCARDIOGRAM (TEE);  Surgeon: Rexene Alberts, MD;  Location: [place];  Service: Open Heart Surgery;  Laterality: N/A;     Current Meds  Medication Sig   busPIRone (BUSPAR) 5 MG tablet Take 1 tablet (5 mg total) by mouth 3 (three) times daily as needed (anxiety).   furosemide (LASIX) 40 MG tablet Take 1 tablet (40 mg total) by mouth daily as needed (As needed for weight gain or shortness of breath).   Homeopathic  Products (HOMEOPATHIC CALM SL) Place under the tongue. Is using Doterra essential oil of Onguard ingestion daily   ipratropium (ATROVENT) 0.06 % nasal spray Place 2 sprays into both nostrils 4 (four) times daily. As needed   QUEtiapine (SEROQUEL) 25 MG tablet TAKE 1 TABLET BY MOUTH EVERYDAY AT BEDTIME     Allergies:   Codeine, Digoxin and related, Macrodantin [nitrofurantoin macrocrystal], and Morphine and related   Social History   Tobacco Use   Smoking status: Every Day    Packs/day: 0.50    Years: 60.00    Pack years: 30.00    Types: Cigarettes    Start date: 1960   Smokeless tobacco: Never   Tobacco comments:    Resumed smoking, after quit 01/2018  Vaping Use   Vaping Use: Never used  Substance Use Topics   Alcohol use: Not Currently    Alcohol/week: 1.0 standard drink    Types: 1 Standard drinks or equivalent per week    Comment: occasionally drinks a margarita   Drug use: No     Family Hx: The patient's family history includes Heart murmur in his brother; Hypertension in an other family member; Irregular heart beat in his mother; Pulmonary embolism in his brother and maternal uncle; Stroke in his mother.  ROS:   Please see the history of present illness.    Review of Systems  Constitutional: Negative.   HENT: Negative.    Respiratory: Negative.    Cardiovascular: Negative.   Gastrointestinal: Negative.   Musculoskeletal: Negative.        Leg weakness  Neurological: Negative.   Psychiatric/Behavioral: Negative.    All other systems reviewed and are negative.   Labs/Other Tests and Data Reviewed:    Recent Labs: 01/30/2021: B Natriuretic Peptide 242.4; TSH 1.029 01/31/2021: ALT 77 02/02/2021: Magnesium 2.6 03/04/2021: BUN 11; Creatinine, Ser 1.12; Hemoglobin 13.2; Platelets 165; Potassium 3.2; Sodium 139   Recent Lipid Panel Lab Results  Component Value Date/Time   CHOL 215 (H) 05/01/2018 10:23 AM   TRIG 79 05/01/2018 10:23 AM   HDL 42 05/01/2018 10:23 AM    CHOLHDL 5.1 05/01/2018 10:23 AM   LDLCALC 157 (H) 05/01/2018 10:23 AM    Wt Readings from Last 3 Encounters:  10/05/21 176 lb 4 oz (79.9 kg)  09/22/21 174 lb (78.9 kg)  07/03/21 178 lb (80.7 kg)     Exam:    Vital Signs:  Vital signs may also be detailed in the HPI BP 130/78 (BP Location: Left Arm, Patient Position: Sitting, Cuff Size: Normal)   Pulse 82   Ht 5\' 10"  (1.778 m)   Wt 176 lb 4 oz (79.9 kg)   SpO2 99%   BMI 25.29 kg/m   Constitutional:  oriented to person, place, and time. No distress.  HENT:  Head: Grossly normal Eyes:  no discharge. No scleral icterus.  Neck: No JVD, no carotid bruits  Cardiovascular: Regular rate and rhythm, no murmurs appreciated Pulmonary/Chest: Clear to auscultation bilaterally, no wheezes or rails Abdominal: Soft.  no distension.  no tenderness.  Musculoskeletal: Normal range of motion Neurological:  normal muscle tone. Coordination normal. No atrophy Skin: Skin warm and dry Psychiatric: normal affect, pleasant   ASSESSMENT & PLAN:    Chronic atrial fibrillation Does not want anticoagultion, very concerned about bleeding in his eye Prior AV node ablation with pacer,  Asymptomatic  Complete heart block (HCC) paced rhythm, followed by EP  NICM (nonischemic cardiomyopathy) ([place]) EF 30 to 35% in 11/2018 up to 45 to 50% in 02/2021 On Lasix as needed, declined any other medications  Atrial flutter, unspecified type [facility]) Previous TIA/strokes Does not want anticoagulation  Dilated aorta aortic ascending aorta is 4.5 cm, on CT scan May 2022 Repeat imaging 2023 at his discretion Discussed details with him   Total encounter time more than 25 minutes  Greater than 50% was spent in counseling and coordination of care with the patient    Signed, Ida Rogue, MD  10/05/2021 12:29 PM    [place] Office [address], [place], [place] [address]

## 2021-10-12 ENCOUNTER — Ambulatory Visit (INDEPENDENT_AMBULATORY_CARE_PROVIDER_SITE_OTHER): Payer: Medicare HMO

## 2021-10-12 DIAGNOSIS — I5020 Unspecified systolic (congestive) heart failure: Secondary | ICD-10-CM

## 2021-10-12 DIAGNOSIS — Z950 Presence of cardiac pacemaker: Secondary | ICD-10-CM

## 2021-10-14 NOTE — Progress Notes (Signed)
EPIC Encounter for ICM Monitoring  Patient Name: Michael Delacruz is a 80 y.o. male Date: 10/14/2021 Primary Care Physican: [name], DO Primary Cardiologist: [name] Electrophysiologist: Vergie Living Pacing: 94%            10/14/2021 Weight: 171 lbs                                                            Spoke with patient and heart failure questions reviewed.  Pt asymptomatic for fluid accumulation and feeling well.  Pt reports "stumbling around x 2 days", blurry vision (off and on longer than 2 weeks), headache x 2 weeks, and dizziness for months.     CorVue thoracic impedance suggesting normal fluid levels.     Prescribed: Furosemide 40 mg Take 1 tablet (40 mg total) by mouth daily as needed (As needed for weight gain or shortness of breath).   Recommendations:   Advised to go to ER if symptoms are urgent or call PCP for appointment.  Encouraged to call for any fluid symptoms.   Follow-up plan: ICM clinic phone appointment on 11/23/2021.   91 day device clinic remote transmission 10/27/2021.     EP/Cardiology Office Visits:  Recall 04/03/2022 with Dr. [name]. Recall 09/22/2022 with Dr Caryl Comes.    Copy of ICM check sent to Dr. Caryl Comes.      3 month ICM trend: 10/12/2021.    1 Year ICM trend:       [name], RN 10/14/2021 8:31 AM

## 2021-10-27 ENCOUNTER — Ambulatory Visit (INDEPENDENT_AMBULATORY_CARE_PROVIDER_SITE_OTHER): Payer: Medicare HMO

## 2021-10-27 DIAGNOSIS — I442 Atrioventricular block, complete: Secondary | ICD-10-CM | POA: Diagnosis not present

## 2021-10-27 LAB — CUP PACEART REMOTE DEVICE CHECK
Battery Remaining Longevity: 57 mo
Battery Remaining Percentage: 64 %
Battery Voltage: 2.99 V
Date Time Interrogation Session: 20221108143047
Implantable Lead Implant Date: 20190515
Implantable Lead Implant Date: 20190515
Implantable Lead Location: 753858
Implantable Lead Location: 753860
Implantable Lead Model: 5076
Implantable Pulse Generator Implant Date: 20190515
Lead Channel Impedance Value: 540 Ohm
Lead Channel Impedance Value: 540 Ohm
Lead Channel Pacing Threshold Amplitude: 0.5 V
Lead Channel Pacing Threshold Amplitude: 0.75 V
Lead Channel Pacing Threshold Pulse Width: 0.5 ms
Lead Channel Pacing Threshold Pulse Width: 0.5 ms
Lead Channel Sensing Intrinsic Amplitude: 7.7 mV
Lead Channel Setting Pacing Amplitude: 2 V
Lead Channel Setting Pacing Amplitude: 2.5 V
Lead Channel Setting Pacing Pulse Width: 0.5 ms
Lead Channel Setting Pacing Pulse Width: 0.5 ms
Lead Channel Setting Sensing Sensitivity: 2 mV
Pulse Gen Model: 3562
Pulse Gen Serial Number: 9427031

## 2021-11-04 NOTE — Progress Notes (Signed)
Remote pacemaker transmission.   

## 2021-11-16 ENCOUNTER — Ambulatory Visit (INDEPENDENT_AMBULATORY_CARE_PROVIDER_SITE_OTHER): Payer: Medicare HMO

## 2021-11-16 ENCOUNTER — Telehealth: Payer: Medicare HMO | Admitting: General Practice

## 2021-11-16 DIAGNOSIS — I5022 Chronic systolic (congestive) heart failure: Secondary | ICD-10-CM

## 2021-11-16 DIAGNOSIS — E7800 Pure hypercholesterolemia, unspecified: Secondary | ICD-10-CM

## 2021-11-16 DIAGNOSIS — I6930 Unspecified sequelae of cerebral infarction: Secondary | ICD-10-CM

## 2021-11-16 DIAGNOSIS — F331 Major depressive disorder, recurrent, moderate: Secondary | ICD-10-CM

## 2021-11-16 DIAGNOSIS — I5032 Chronic diastolic (congestive) heart failure: Secondary | ICD-10-CM

## 2021-11-16 DIAGNOSIS — I25118 Atherosclerotic heart disease of native coronary artery with other forms of angina pectoris: Secondary | ICD-10-CM

## 2021-11-16 DIAGNOSIS — Z8673 Personal history of transient ischemic attack (TIA), and cerebral infarction without residual deficits: Secondary | ICD-10-CM

## 2021-11-16 DIAGNOSIS — J432 Centrilobular emphysema: Secondary | ICD-10-CM

## 2021-11-16 DIAGNOSIS — F411 Generalized anxiety disorder: Secondary | ICD-10-CM

## 2021-11-16 NOTE — Chronic Care Management (AMB) (Signed)
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 or exercise tolerance  COPD/centrilobular emphysema : (Status: Goal on Track (progressing): YES.) Long Term Goal  Reviewed medications with patient, including use of prescribed maintenance and rescue inhalers, and provided instruction on medication management and the importance of adherence Provided patient with basic written and verbal COPD/Centrilobular emphysema education on self care/management/and exacerbation prevention Advised patient to track and manage COPD/Centrilobular emphysema triggers Provided written and verbal instructions on pursed lip breathing and utilized returned demonstration as teach back Provided instruction about proper use of medications used for management of COPD/Centrilobular emphysema  including inhalers Advised patient to self assesses COPD/Centrilobular emphysema  action plan zone and make appointment with provider if in the yellow zone for 48 hours without improvement Advised patient to engage in light exercise as tolerated 3-5 days a week to aid in the the management of COPD/Centrilobular emphysema  Provided education about and advised  patient to utilize infection prevention strategies to reduce risk of respiratory infection.  11-16-2021: The patient states that he is congested in the mornings and it is in his chest. He is coughing up thick sputum and he states that it is yellow in color. Education on monitoring for changes in color of sputum and to notify the provider for green or dark brown sputum. Patient declined assistance with an appointment but will call for changes.  Discussed the importance of adequate rest and management of fatigue with COPD/Centrilobular emphysema   Anxiety and Depression   (Status: Goal on Track (progressing): YES.) Long Term Goal  Evaluation of current treatment plan related to Anxiety and Depression, Mental Health Concerns  and Memory Deficits self-management and patient's adherence to plan as established by provider. Discussed plans with patient for ongoing care management follow up and provided patient with direct contact information for care management team Advised patient to call the office for changes in mood, depression,  anxiety, or worsening memory changes ; Provided education to patient re: doing activities to stimulate his memory and mind. The patient feels he is having issues with his long term and short term memory. The patient states it seems to be getting worse over time. Education on discussing his concerns with pcp. The patient states he will consider making an appointment to discuss his concerns with the pcp; Reviewed medications with patient and discussed compliance ; Discussed plans with patient for ongoing care management follow up and provided patient with direct contact information for care management team; Screening for signs and symptoms of depression related to chronic disease state;  Assessed social determinant of health barriers;   Hyperlipidemia:  (Status: Goal on Track (progressing): YES.) Long Term Goal  Lab Results  Component Value Date   CHOL 215 (H) 05/01/2018   HDL 42  05/01/2018   LDLCALC 157 (H) 05/01/2018   TRIG 79 05/01/2018   CHOLHDL 5.1 05/01/2018     Medication review performed; medication list updated in electronic medical record.  Provider established cholesterol goals reviewed; Counseled on importance of regular laboratory monitoring as prescribed; Provided HLD educational materials; Reviewed role and benefits of statin for ASCVD risk reduction; Discussed strategies to manage statin-induced myalgias; Reviewed importance of limiting foods high in cholesterol;   History of Stroke   (Status: Goal on Track (progressing): YES.) Long Term Goal  Evaluation of current treatment plan related to  history of stroke  ,  self-management and patient's adherence to plan as established by provider. Discussed plans with patient for ongoing care management follow up and provided patient with direct contact information for care management team Advised patient to call the office for changes related to past history of stroke, discussed various sx and sx that may be a residual effect. 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 Education on discussing concerns with the pcp; Discussed plans with patient for ongoing care management follow up and provided patient with direct contact information for care management team; Advised patient to discuss changes in memory and his concerns and questions  with provider;   Patient Goals/Self-Care Activities: Take medications as prescribed   Attend all scheduled provider appointments Call pharmacy for medication refills 3-7 days in advance of running out of medications Attend church or other social activities Perform all self care activities independently  Perform IADL's (shopping, preparing meals, housekeeping, managing finances) independently Call provider office for new concerns or questions  Work with the social worker to address care coordination needs and will continue to  work with the clinical team to address health care and disease management related needs call the Suicide and Crisis Lifeline: 988 call the [place] National Suicide Prevention Lifeline: 831-029-6155 or TTY: 302-090-4659 TTY (838)466-2788) to talk to a trained counselor call 1-800-273-TALK (toll free, 24 hour hotline) if experiencing a Mental Health or [place]  call office if I gain more than 2 pounds in one day or 5 pounds in one week keep legs up while sitting track weight in diary use salt in moderation watch for swelling in feet, ankles and legs every day weigh myself daily develop a rescue plan follow rescue plan if symptoms flare-up eat more whole grains, fruits and vegetables, lean meats and healthy fats know when to call the doctorfor changes in sx and sx of heart failure  track symptoms and what helps feel better or worse dress right for the weather, hot or cold -  call for medicine refill 2 or 3 days before it runs out - take all medications exactly as prescribed - call doctor with any symptoms you believe are related to your medicine - call doctor when you experience any new symptoms - go to all doctor appointments as scheduled - adhere to prescribed diet: heart healthy/ADA diet        Plan:Telephone follow up appointment with care management team member scheduled for:  01-18-2022 at 1 pm  [place], MSN, [facility] Mobile: [phone]

## 2021-11-16 NOTE — Patient Instructions (Signed)
Visit Information  Thank you for taking time to visit with me today. Please don't hesitate to contact me if I can be of assistance to you before our next scheduled telephone appointment.  Following are the goals we discussed today:  RNCM Clinical Goal(s):  Patient will verbalize basic understanding of CAD, HLD, Anxiety, Depression, Pulmonary Disease, and history of stroke  disease process and self health management plan as evidenced by following the plan of care, taking medications as directed, working with the CCM team and pcp to optimize health and well being  take all medications exactly as prescribed and will call provider for medication related questions as evidenced by compliance with medications and calling for refills before running out of medications     continue to work with Consulting civil engineer and/or Social Worker to address care management and care coordination needs related to Anxiety, Depression, and memory changes  as evidenced by adherence to CM Team Scheduled appointments     demonstrate a decrease in CAD, HLD, Anxiety, Depression, Pulmonary Disease, and history of stroke  exacerbations  as evidenced by working with the CCM team and pcp to optimize plan of care, with stable conditions and not having any acute exacerbations of chronic conditions  demonstrate ongoing self health care management ability for effective management of chronic conditions  as evidenced by working with the CCM team through collaboration with Consulting civil engineer, provider, and care team.    Interventions: 1:1 collaboration with primary care provider regarding development and update of comprehensive plan of care as evidenced by provider attestation and co-signature Inter-disciplinary care team collaboration (see longitudinal plan of care) Evaluation of current treatment plan related to  self management and patient's adherence to plan as established by provider     Heart Failure Interventions:  (Status: Goal on Track  (progressing): YES.)  Long Term Goal  Basic overview and discussion of pathophysiology of Heart Failure reviewed Provided education on low sodium diet Reviewed Heart Failure Action Plan in depth and provided written copy Assessed need for readable accurate scales in home Provided education about placing scale on hard, flat surface Advised patient to weigh each morning after emptying bladder Discussed importance of daily weight and advised patient to weigh and record daily Reviewed role of diuretics in prevention of fluid overload and management of heart failure Discussed the importance of keeping all appointments with provider Provided patient with education about the role of exercise in the management of heart failure    CAD  (Status: Goal on Track (progressing): YES.) Long Term Goal  Assessed understanding of CAD diagnosis Medications reviewed including medications utilized in CAD treatment plan Provided education on importance of blood pressure control in management of CAD; Provided education on Importance of limiting foods high in cholesterol; Counseled on importance of regular laboratory monitoring as prescribed; Reviewed Importance of taking all medications as prescribed Reviewed Importance of attending all scheduled provider appointments Advised to report any changes in symptoms or exercise tolerance   COPD/centrilobular emphysema : (Status: Goal on Track (progressing): YES.) Long Term Goal  Reviewed medications with patient, including use of prescribed maintenance and rescue inhalers, and provided instruction on medication management and the importance of adherence Provided patient with basic written and verbal COPD/Centrilobular emphysema education on self care/management/and exacerbation prevention Advised patient to track and manage COPD/Centrilobular emphysema triggers Provided written and verbal instructions on pursed lip breathing and utilized returned demonstration as teach  back Provided instruction about proper use of medications used for management of COPD/Centrilobular  emphysema  including inhalers Advised patient to self assesses COPD/Centrilobular emphysema  action plan zone and make appointment with provider if in the yellow zone for 48 hours without improvement Advised patient to engage in light exercise as tolerated 3-5 days a week to aid in the the management of COPD/Centrilobular emphysema  Provided education about and advised patient to utilize infection prevention strategies to reduce risk of respiratory infection.  11-16-2021: The patient states that he is congested in the mornings and it is in his chest. He is coughing up thick sputum and he states that it is yellow in color. Education on monitoring for changes in color of sputum and to notify the provider for green or dark brown sputum. Patient declined assistance with an appointment but will call for changes.  Discussed the importance of adequate rest and management of fatigue with COPD/Centrilobular emphysema    Anxiety and Depression   (Status: Goal on Track (progressing): YES.) Long Term Goal  Evaluation of current treatment plan related to Anxiety and Depression, Mental Health Concerns  and Memory Deficits self-management and patient's adherence to plan as established by provider. Discussed plans with patient for ongoing care management follow up and provided patient with direct contact information for care management team Advised patient to call the office for changes in mood, depression, anxiety, or worsening memory changes ; Provided education to patient re: doing activities to stimulate his memory and mind. The patient feels he is having issues with his long term and short term memory. The patient states it seems to be getting worse over time. Education on discussing his concerns with pcp. The patient states he will consider making an appointment to discuss his concerns with the pcp; Reviewed  medications with patient and discussed compliance ; Discussed plans with patient for ongoing care management follow up and provided patient with direct contact information for care management team; Screening for signs and symptoms of depression related to chronic disease state;  Assessed social determinant of health barriers;    Hyperlipidemia:  (Status: Goal on Track (progressing): YES.) Long Term Goal       Lab Results  Component Value Date    CHOL 215 (H) 05/01/2018    HDL 42 05/01/2018    LDLCALC 157 (H) 05/01/2018    TRIG 79 05/01/2018    CHOLHDL 5.1 05/01/2018      Medication review performed; medication list updated in electronic medical record.  Provider established cholesterol goals reviewed; Counseled on importance of regular laboratory monitoring as prescribed; Provided HLD educational materials; Reviewed role and benefits of statin for ASCVD risk reduction; Discussed strategies to manage statin-induced myalgias; Reviewed importance of limiting foods high in cholesterol;     History of Stroke   (Status: Goal on Track (progressing): YES.) Long Term Goal  Evaluation of current treatment plan related to  history of stroke  ,  self-management and patient's adherence to plan as established by provider. Discussed plans with patient for ongoing care management follow up and provided patient with direct contact information for care management team Advised patient to call the office for changes related to past history of stroke, discussed various sx and sx that may be a residual effect. 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 Education on discussing concerns with the pcp; Discussed plans with patient for ongoing care management follow up and provided patient with direct contact information for care management team; Advised patient to discuss changes in memory and  his concerns and questions  with provider;    Patient  Goals/Self-Care Activities: Take medications as prescribed   Attend all scheduled provider appointments Call pharmacy for medication refills 3-7 days in advance of running out of medications Attend church or other social activities Perform all self care activities independently  Perform IADL's (shopping, preparing meals, housekeeping, managing finances) independently Call provider office for new concerns or questions  Work with the social worker to address care coordination needs and will continue to work with the clinical team to address health care and disease management related needs call the Suicide and Crisis Lifeline: 988 call the [place] National Suicide Prevention Lifeline: (732) 232-1277 or TTY: 740-544-9904 TTY 727-257-9287) to talk to a trained counselor call 1-800-273-TALK (toll free, 24 hour hotline) if experiencing a Mental Health or [place]  call office if I gain more than 2 pounds in one day or 5 pounds in one week keep legs up while sitting track weight in diary use salt in moderation watch for swelling in feet, ankles and legs every day weigh myself daily develop a rescue plan follow rescue plan if symptoms flare-up eat more whole grains, fruits and vegetables, lean meats and healthy fats know when to call the doctorfor changes in sx and sx of heart failure  track symptoms and what helps feel better or worse dress right for the weather, hot or cold - call for medicine refill 2 or 3 days before it runs out - take all medications exactly as prescribed - call doctor with any symptoms you believe are related to your medicine - call doctor when you experience any new symptoms - go to all doctor appointments as scheduled - adhere to prescribed diet: heart healthy/ADA diet   Our next appointment is by telephone on 01-18-2022  at 1 pm  Please call the care guide team at [phone] if you need to cancel or reschedule your appointment.   If you are  experiencing a Mental Health or [place] or need someone to talk to, please call the Suicide and Crisis Lifeline: 988 call the [place] National Suicide Prevention Lifeline: 587-648-3729 or TTY: 402-872-0361 TTY 309-179-8984) to talk to a trained counselor call 1-800-273-TALK (toll free, 24 hour hotline)   Patient verbalizes understanding of instructions provided today and agrees to view in [place].   Noreene Larsson RN, MSN, [place] Mobile: [phone]

## 2021-11-18 DIAGNOSIS — I5032 Chronic diastolic (congestive) heart failure: Secondary | ICD-10-CM

## 2021-11-18 DIAGNOSIS — J432 Centrilobular emphysema: Secondary | ICD-10-CM | POA: Diagnosis not present

## 2021-11-18 DIAGNOSIS — E7800 Pure hypercholesterolemia, unspecified: Secondary | ICD-10-CM

## 2021-11-18 DIAGNOSIS — I25118 Atherosclerotic heart disease of native coronary artery with other forms of angina pectoris: Secondary | ICD-10-CM | POA: Diagnosis not present

## 2021-11-18 DIAGNOSIS — F331 Major depressive disorder, recurrent, moderate: Secondary | ICD-10-CM | POA: Diagnosis not present

## 2021-11-18 DIAGNOSIS — F17210 Nicotine dependence, cigarettes, uncomplicated: Secondary | ICD-10-CM

## 2021-11-23 ENCOUNTER — Ambulatory Visit (INDEPENDENT_AMBULATORY_CARE_PROVIDER_SITE_OTHER): Payer: Medicare HMO

## 2021-11-23 DIAGNOSIS — Z950 Presence of cardiac pacemaker: Secondary | ICD-10-CM

## 2021-11-23 DIAGNOSIS — I5020 Unspecified systolic (congestive) heart failure: Secondary | ICD-10-CM | POA: Diagnosis not present

## 2021-11-25 ENCOUNTER — Telehealth: Payer: Self-pay

## 2021-11-25 NOTE — Progress Notes (Signed)
EPIC Encounter for ICM Monitoring  Patient Name: Michael Delacruz is a 80 y.o. male Date: 11/25/2021 Primary Care Physican: [name], DO Primary Cardiologist: [name] Electrophysiologist: Vergie Living Pacing: 94%            10/14/2021 Weight: 171 lbs                                                            Attempted call to patient and unable to reach.  Left detailed message per DPR regarding transmission. Transmission reviewed.    CorVue thoracic impedance suggesting normal fluid levels.     Prescribed: Furosemide 40 mg Take 1 tablet (40 mg total) by mouth daily as needed (As needed for weight gain or shortness of breath).   Recommendations:  Left voice mail with ICM number and encouraged to call if experiencing any fluid symptoms.   Follow-up plan: ICM clinic phone appointment on 12/28/2021.   91 day device clinic remote transmission 02/15/2022.     EP/Cardiology Office Visits:  Recall 04/03/2022 with Dr. [name]. Recall 09/22/2022 with Dr Caryl Comes.    Copy of ICM check sent to Dr. Caryl Comes.      3 month ICM trend: 11/23/2021.    12-14 Month ICM trend:       [name], RN 11/25/2021 3:43 PM

## 2021-11-25 NOTE — Telephone Encounter (Signed)
Remote ICM transmission received.  Attempted call to patient regarding ICM remote transmission and left detailed message per DPR.  Advised to return call for any fluid symptoms or questions. Next ICM remote transmission scheduled 12/28/2021.    

## 2021-12-08 ENCOUNTER — Ambulatory Visit (INDEPENDENT_AMBULATORY_CARE_PROVIDER_SITE_OTHER): Payer: Medicare HMO | Admitting: Family Medicine

## 2021-12-08 ENCOUNTER — Encounter: Payer: Self-pay | Admitting: Family Medicine

## 2021-12-08 ENCOUNTER — Other Ambulatory Visit: Payer: Self-pay | Admitting: Family Medicine

## 2021-12-08 ENCOUNTER — Other Ambulatory Visit: Payer: Self-pay

## 2021-12-08 VITALS — BP 131/76 | HR 88 | Ht 70.0 in | Wt 172.0 lb

## 2021-12-08 DIAGNOSIS — R221 Localized swelling, mass and lump, neck: Secondary | ICD-10-CM

## 2021-12-08 DIAGNOSIS — F411 Generalized anxiety disorder: Secondary | ICD-10-CM

## 2021-12-08 DIAGNOSIS — J0110 Acute frontal sinusitis, unspecified: Secondary | ICD-10-CM

## 2021-12-08 DIAGNOSIS — F331 Major depressive disorder, recurrent, moderate: Secondary | ICD-10-CM

## 2021-12-08 DIAGNOSIS — G4701 Insomnia due to medical condition: Secondary | ICD-10-CM

## 2021-12-08 DIAGNOSIS — M542 Cervicalgia: Secondary | ICD-10-CM

## 2021-12-08 MED ORDER — QUETIAPINE FUMARATE 25 MG PO TABS: 25.0000 mg | ORAL_TABLET | Freq: Every day | ORAL | 3 refills | Status: DC

## 2021-12-08 MED ORDER — AMOXICILLIN-POT CLAVULANATE 875-125 MG PO TABS: 1.0000 | ORAL_TABLET | Freq: Two times a day (BID) | ORAL | 0 refills | Status: DC

## 2021-12-08 MED ORDER — PREDNISONE 10 MG PO TABS: ORAL_TABLET | ORAL | 0 refills | Status: DC

## 2021-12-08 NOTE — Patient Instructions (Addendum)
Thank you for coming to the office today.  Right Anterior Neck Nodular Swelling, concern for enlarged lymph node causing pain. Cannot rule out neck mass.  Would recommend Referral to new ENT specialist for further evaluation.  Let me know if you need me to place an order / referral to a new ENT once you choose one.   STAT order for Ultrasound. Call them within 1-2 days if not heard yet, and schedule.  [place] Address: [address] [place], [place], [place] [address] Phone: [phone]  ------------------------------------------   Please schedule a Follow-up Appointment to: Return in about 4 weeks (around 01/05/2022), or if symptoms worsen or fail to improve, for 4 weeks headaches / neck pain swelling.  If you have any other questions or concerns, please feel free to call the office or send a message through [place]. You may also schedule an earlier appointment if necessary.  Additionally, you may be receiving a survey about your experience at our office within a few days to 1 week by e-mail or mail. We value your feedback.  Michael Putnam, DO [place]

## 2021-12-08 NOTE — Progress Notes (Signed)
Subjective:    Patient ID: Michael Delacruz, male    DOB: [birth year], 80 y.o.   MRN: [id number]  Michael Delacruz is a 80 y.o. male presenting on 12/08/2021 for Headache   HPI  Right Ear Pain Right Neck Mass History Nasal Cancer Reports R ear pain for past 1 month, describes pain radiating into jaw and into shoulder. He has R neck bulging nodular mass that is painful. He plans to return to ENT - he will find new one. Previous Dr Ezzard Standing for nasal surgery has retired. He had diagnosis of carcinoma nasal cancer removal Admits associated headaches R sided.  Post-COVID Poor balance Difficulty walking, standing straight up, not painful, difficulty with coordination and function Admits some numbness in lower extremity weakness Poor balance since COVID, he has Left eye worsening vision and weakness History of CVA  Past Surgical History:  Procedure Laterality Date   ANKLE SURGERY     AV NODE ABLATION N/A 05/04/2018   Procedure: AV NODE ABLATION;  Surgeon: Hillis Range, MD;  Location: [facility] INVASIVE CV LAB;  Service: Cardiovascular;  Laterality: N/A;   BIV PACEMAKER INSERTION CRT-P N/A 05/03/2018   Procedure: BIV PACEMAKER INSERTION CRT-P;  Surgeon: Duke Salvia, MD;  Location: [facility] INVASIVE CV LAB;  Service: Cardiovascular;  Laterality: N/A;   CARDIAC CATHETERIZATION N/A 10/03/2015   Procedure: Right and Left Heart Cath and Coronary Angiography;  Surgeon: Antonieta Iba, MD;  Location: [facility] INVASIVE CV LAB;  Service: Cardiovascular;  Laterality: N/A;   COLONOSCOPY     ELECTROPHYSIOLOGIC STUDY N/A 08/18/2015   Procedure: CARDIOVERSION;  Surgeon: Iran Ouch, MD;  Location: [facility] ORS;  Service: Cardiovascular;  Laterality: N/A;   MASS EXCISION N/A 03/04/2021   Procedure: EXCISION INNER NASAL CANCER;  Surgeon: Drema Halon, MD;  Location: [facility] OR;  Service: ENT;  Laterality: N/A;   MINIMALLY INVASIVE MAZE PROCEDURE N/A 10/22/2015   Procedure: MINIMALLY INVASIVE MAZE PROCEDURE;   Surgeon: Purcell Nails, MD;  Location: [facility] OR;  Service: Open Heart Surgery;  Laterality: N/A;   MITRAL VALVE REPAIR Right 10/22/2015   Procedure: MINIMALLY INVASIVE MITRAL VALVE REPAIR (MVR);  Surgeon: Purcell Nails, MD;  Location: [facility] OR;  Service: Open Heart Surgery;  Laterality: Right;   SINUS EXPLORATION     SKIN FULL THICKNESS GRAFT N/A 03/04/2021   Procedure: SKIN GRAFT FULL THICKNESS;  Surgeon: Drema Halon, MD;  Location: [facility] OR;  Service: ENT;  Laterality: N/A;   TEE WITHOUT CARDIOVERSION N/A 08/18/2015   Procedure: TRANSESOPHAGEAL ECHOCARDIOGRAM (TEE);  Surgeon: Iran Ouch, MD;  Location: [facility] ORS;  Service: Cardiovascular;  Laterality: N/A;   TEE WITHOUT CARDIOVERSION N/A 10/22/2015   Procedure: TRANSESOPHAGEAL ECHOCARDIOGRAM (TEE);  Surgeon: Purcell Nails, MD;  Location: [facility] OR;  Service: Open Heart Surgery;  Laterality: N/A;     Depression screen [facility] 2/9 12/23/2020 06/09/2020 02/21/2020  Decreased Interest 0 1 2  Down, Depressed, Hopeless 3 2 3   PHQ - 2 Score 3 3 5   Altered sleeping 0 2 2  Tired, decreased energy 3 2 3   Change in appetite 0 3 3  Feeling bad or failure about yourself  0 2 2  Trouble concentrating 2 0 0  Moving slowly or fidgety/restless 0 0 0  Suicidal thoughts 0 0 0  PHQ-9 Score 8 12 15   Difficult doing work/chores Somewhat difficult Not difficult at all Not difficult at all  Some recent data might be hidden    Social History  Tobacco Use   Smoking status: Every Day    Packs/day: 0.50    Years: 60.00    Pack years: 30.00    Types: Cigarettes    Start date: 1960   Smokeless tobacco: Never   Tobacco comments:    Resumed smoking, after quit 01/2018  Vaping Use   Vaping Use: Never used  Substance Use Topics   Alcohol use: Not Currently    Alcohol/week: 1.0 standard drink    Types: 1 Standard drinks or equivalent per week    Comment: occasionally drinks a margarita   Drug use: No    Review of Systems Per HPI unless specifically  indicated above     Objective:    BP 131/76   Pulse 88   Ht 5\' 10"  (1.778 m)   Wt 172 lb (78 kg)   SpO2 100%   BMI 24.68 kg/m   Wt Readings from Last 3 Encounters:  12/08/21 172 lb (78 kg)  10/05/21 176 lb 4 oz (79.9 kg)  09/22/21 174 lb (78.9 kg)    Physical Exam Vitals and nursing note reviewed.  Constitutional:      General: He is not in acute distress.    Appearance: Normal appearance. He is well-developed. He is not diaphoretic.     Comments: Well-appearing, comfortable, cooperative  HENT:     Head: Normocephalic and atraumatic.  Eyes:     General:        Right eye: No discharge.        Left eye: No discharge.     Conjunctiva/sclera: Conjunctivae normal.  Neck:     Comments: Right neck nodular density 2+ cm firmness and tenderness, question if LAD or if mass. Cardiovascular:     Rate and Rhythm: Normal rate.  Pulmonary:     Effort: Pulmonary effort is normal.  Skin:    General: Skin is warm and dry.     Findings: No erythema or rash.  Neurological:     Mental Status: He is alert and oriented to person, place, and time.  Psychiatric:        Mood and Affect: Mood normal.        Behavior: Behavior normal.        Thought Content: Thought content normal.     Comments: Well groomed, good eye contact, normal speech and thoughts   Results for orders placed or performed in visit on 10/27/21  CUP PACEART REMOTE DEVICE CHECK  Result Value Ref Range   Date Time Interrogation Session [id number]    Pulse Generator Manufacturer [employer]    Pulse Gen Model 3562 Quadra Allure MP    Pulse Gen Serial Number [id number]    Clinic Name [facility]    Implantable Pulse Generator Type Cardiac Resynch Therapy Pacemaker    Implantable Pulse Generator Implant Date [id number]    Implantable Lead Manufacturer [facility]    Implantable Lead Model 5076 CapSureFix Novus    Implantable Lead Serial Number [id number]    Implantable Lead Implant Date [id number]    Implantable Lead Location Detail  1 UNKNOWN    Implantable Lead Location [id number]    Implantable Lead Manufacturer [facility]    Implantable Lead Model 1458Q Quartet    Implantable Lead Serial Number [id number]    Implantable Lead Implant Date [id number]    Implantable Lead Location Detail 1 UNKNOWN    Implantable Lead Location [id number]    Lead Channel Setting Sensing Sensitivity 2.0 mV   Lead Channel Setting Sensing Adaptation Mode Fixed Pacing  Lead Channel Setting Pacing Pulse Width 0.5 ms   Lead Channel Setting Pacing Amplitude 2.5 V   Lead Channel Setting Pacing Pulse Width 0.5 ms   Lead Channel Setting Pacing Amplitude 2.0 V   Lead Channel Setting Pacing Capture Mode Fixed Pacing    Lead Channel Status NULL    Lead Channel Impedance Value 540 ohm   Lead Channel Pacing Threshold Amplitude 0.5 V   Lead Channel Pacing Threshold Pulse Width 0.5 ms   Lead Channel Status NULL    Lead Channel Impedance Value 540 ohm   Lead Channel Sensing Intrinsic Amplitude 7.7 mV   Lead Channel Pacing Threshold Amplitude 0.75 V   Lead Channel Pacing Threshold Pulse Width 0.5 ms   Battery Status MOS    Battery Remaining Longevity 57 mo   Battery Remaining Percentage 64.0 %   Battery Voltage 2.99 V      Assessment & Plan:   Problem List Items Addressed This Visit   None Visit Diagnoses     Neck pain on right side    -  Primary   Relevant Medications   predniSONE (DELTASONE) 10 MG tablet   Other Relevant Orders   US Soft Tissue Head/Neck (NON-THYROID)   Mass of right side of neck       Relevant Medications   predniSONE (DELTASONE) 10 MG tablet   Other Relevant Orders   US Soft Tissue Head/Neck (NON-THYROID)   Subacute frontal sinusitis       Relevant Medications   amoxicillin-clavulanate (AUGMENTIN) 875-125 MG tablet   predniSONE (DELTASONE) 10 MG tablet       Concern with R sided neck mass / LAD as likely source of his pain / headaches STAT US Neck ordered for [facility] to be scheduled 24-48 hours. He has history of  nasal cancer s/p surgery in 02/2021 He is due to follow back up with new ENT since prior retired, he will check his list given by prior doctor and let me know where he plans to go and we can place a referral order. He declines local [place] today.  Follow up STAT US Results.  Sinusitis In addition to this issue, likely underlying sinusitis contributing to headaches and symptoms Start Augmentin antibiotic course Start Prednisone taper for pressure pain  Additionally reviewed some poor balance / post -covid, discussed these issues at end of visit but not primary focus today. No new changes  Orders Placed This Encounter  Procedures   US Soft Tissue Head/Neck (NON-THYROID)    Standing Status:   Future    Standing Expiration Date:   12/08/2022    Scheduling Instructions:     REQUESTED SCHEDULE Weds 12/21 IF POSSIBLE    Order Specific Question:   Reason for Exam (SYMPTOM  OR DIAGNOSIS REQUIRED)    Answer:   Right neck pain swelling mass vs lymph node, history of nasal cancer    Order Specific Question:   Preferred imaging location?    Answer:   [facility]    Order Specific Question:   Call Results- Best Contact Number?    Answer:   [phone]- pt can leave     Meds ordered this encounter  Medications   amoxicillin-clavulanate (AUGMENTIN) 875-125 MG tablet    Sig: Take 1 tablet by mouth 2 (two) times daily.    Dispense:  20 tablet    Refill:  0   predniSONE (DELTASONE) 10 MG tablet    Sig: Take 6 tabs with breakfast Day 1, 5 tabs  Day 2, 4 tabs Day 3, 3 tabs Day 4, 2 tabs Day 5, 1 tab Day 6.    Dispense:  21 tablet    Refill:  0      Follow up plan: Return in about 4 weeks (around 01/05/2022), or if symptoms worsen or fail to improve, for 4 weeks headaches / neck pain swelling.   Saralyn Pilar, DO [facility] 12/08/2021, 9:00 AM

## 2021-12-09 ENCOUNTER — Ambulatory Visit
Admission: RE | Admit: 2021-12-09 | Discharge: 2021-12-09 | Disposition: A | Discharge status: Home / Self Care | Payer: Medicare HMO | Source: Ambulatory Visit | Attending: Family Medicine | Admitting: Family Medicine

## 2021-12-09 ENCOUNTER — Encounter: Payer: Self-pay | Admitting: Internal Medicine

## 2021-12-09 DIAGNOSIS — R221 Localized swelling, mass and lump, neck: Secondary | ICD-10-CM | POA: Insufficient documentation

## 2021-12-09 DIAGNOSIS — M542 Cervicalgia: Secondary | ICD-10-CM

## 2021-12-09 DIAGNOSIS — I6523 Occlusion and stenosis of bilateral carotid arteries: Secondary | ICD-10-CM | POA: Diagnosis not present

## 2021-12-09 DIAGNOSIS — Z8739 Personal history of other diseases of the musculoskeletal system and connective tissue: Secondary | ICD-10-CM | POA: Diagnosis not present

## 2021-12-18 ENCOUNTER — Telehealth: Payer: Self-pay

## 2021-12-18 NOTE — Telephone Encounter (Signed)
Copied from [place] [phone]. Topic: General - Other >> Dec 17, 2021  3:27 PM McGill, Nelva Bush wrote: Reason for CRM: Pt requesting a call back for imaging results from 12/09/2021

## 2021-12-18 NOTE — Telephone Encounter (Signed)
Pt informed.  KP 

## 2021-12-28 ENCOUNTER — Ambulatory Visit (INDEPENDENT_AMBULATORY_CARE_PROVIDER_SITE_OTHER): Payer: Medicare HMO

## 2021-12-28 DIAGNOSIS — I5020 Unspecified systolic (congestive) heart failure: Secondary | ICD-10-CM | POA: Diagnosis not present

## 2021-12-28 DIAGNOSIS — Z950 Presence of cardiac pacemaker: Secondary | ICD-10-CM | POA: Diagnosis not present

## 2021-12-29 ENCOUNTER — Ambulatory Visit: Payer: Medicare HMO

## 2021-12-30 ENCOUNTER — Telehealth: Payer: Self-pay

## 2021-12-30 NOTE — Progress Notes (Signed)
EPIC Encounter for ICM Monitoring  Patient Name: Michael Delacruz is a 81 y.o. male Date: 12/30/2021 Primary Care Physican: [name], DO Primary Cardiologist: [name] Electrophysiologist: Vergie Living Pacing: 94%            10/14/2021 Weight: 171 lbs                                                            Attempted call to patient and unable to reach.  Left detailed message per DPR regarding transmission. Transmission reviewed.    CorVue thoracic impedance suggesting normal fluid levels.     Prescribed: Furosemide 40 mg Take 1 tablet (40 mg total) by mouth daily as needed (As needed for weight gain or shortness of breath).   Recommendations:  Left voice mail with ICM number and encouraged to call if experiencing any fluid symptoms.   Follow-up plan: ICM clinic phone appointment on 02/01/2022.   91 day device clinic remote transmission 02/15/2022.     EP/Cardiology Office Visits:  Recall 04/03/2022 with Dr. [name]. Recall 09/22/2022 with Dr Caryl Comes.    Copy of ICM check sent to Dr. Caryl Comes.  3 month ICM trend: 12/28/2021.    12-14 Month ICM trend:     [name], RN 12/30/2021 4:03 PM

## 2021-12-30 NOTE — Telephone Encounter (Signed)
Remote ICM transmission received.  Attempted call to patient regarding ICM remote transmission and left detailed message per DPR.  Advised to return call for any fluid symptoms or questions. Next ICM remote transmission scheduled 02/01/2022.

## 2022-01-01 ENCOUNTER — Ambulatory Visit (INDEPENDENT_AMBULATORY_CARE_PROVIDER_SITE_OTHER): Payer: Medicare HMO

## 2022-01-01 ENCOUNTER — Other Ambulatory Visit: Payer: Self-pay

## 2022-01-01 VITALS — BP 120/78 | HR 80 | Temp 97.5°F | Ht 70.0 in | Wt 179.2 lb

## 2022-01-01 DIAGNOSIS — Z0000 Encounter for general adult medical examination without abnormal findings: Secondary | ICD-10-CM | POA: Diagnosis not present

## 2022-01-01 NOTE — Progress Notes (Signed)
Subjective:   Michael Delacruz is a 81 y.o. male who presents for Medicare Annual/Subsequent preventive examination.  Review of Systems           Objective:    Today's Vitals   01/01/22 1402 01/01/22 1410  BP: 120/78   Pulse: 80   Temp: (!) 97.5 F (36.4 C)   TempSrc: Oral   SpO2: 99%   Weight: 179 lb 3.2 oz (81.3 kg)   Height: 5\' 10"  (1.778 m)   PainSc:  0-No pain   Body mass index is 25.71 kg/m.  Advanced Directives 03/04/2021 01/31/2021 01/30/2021 12/23/2020 02/27/2019 02/27/2019 05/03/2018  Does Patient Have a Medical Advance Directive? No No No No No Yes No  Does patient want to make changes to medical advance directive? - - - - Yes (MAU/Ambulatory/Procedural Areas - Information given) Yes (MAU/Ambulatory/Procedural Areas - Information given) -  Would patient like information on creating a medical advance directive? - No - Patient declined - - - - No - Patient declined    Current Medications (verified) Outpatient Encounter Medications as of 01/01/2022  Medication Sig   amoxicillin-clavulanate (AUGMENTIN) 875-125 MG tablet Take 1 tablet by mouth 2 (two) times daily.   busPIRone (BUSPAR) 5 MG tablet Take 1 tablet (5 mg total) by mouth 3 (three) times daily as needed (anxiety).   furosemide (LASIX) 40 MG tablet Take 1 tablet (40 mg total) by mouth daily as needed (As needed for weight gain or shortness of breath).   Homeopathic Products (HOMEOPATHIC CALM SL) Place under the tongue. Is using Doterra essential oil of Onguard ingestion daily   ipratropium (ATROVENT) 0.06 % nasal spray Place 2 sprays into both nostrils 4 (four) times daily. As needed   predniSONE (DELTASONE) 10 MG tablet Take 6 tabs with breakfast Day 1, 5 tabs Day 2, 4 tabs Day 3, 3 tabs Day 4, 2 tabs Day 5, 1 tab Day 6.   QUEtiapine (SEROQUEL) 25 MG tablet Take 1 tablet (25 mg total) by mouth at bedtime.   No facility-administered encounter medications on file as of 01/01/2022.    Allergies (verified) Codeine,  Digoxin and related, Macrodantin [nitrofurantoin macrocrystal], and Morphine and related   History: Past Medical History:  Diagnosis Date   Anxiety    Arthritis    Ascending aortic aneurysm    a. 11/2018 Echo: Ao root 69mm, Asc Ao 79mm; b. 02/2021 Echo: Ao root 45mm; c. 04/2021 CT chest: Asc Ao 4.5cm.   Asthma    Bradycardia    CAD in native artery    a. LHC 09/2015: 40% pCx, 35% mRCA.   Chronic HFrEF (heart failure with reduced ejection fraction) (HCC)    COPD (chronic obstructive pulmonary disease) (HCC)    Dilation of intestine 01/2015   Fibromyalgia    Frequent headaches    GERD (gastroesophageal reflux disease)    History of blood clots    eye    History of hiatal hernia    History of kidney stones    History of rheumatic fever    Hypertension    NICM (nonischemic cardiomyopathy) ([place])    a. 07/2017 Echo: EF reduced to 40-45%; b. 07/2018 Echo: EF 25-30%; c. 11/2018 Echo: EF 30-35%; d. 02/2021 Echo: EF 45-50%, septal and apical septal HK. Mild MR. Sev dil LA. Mod dil RA. Ao root 15mm.   Permanent atrial fibrillation ([place])    a. s/p TEE/DCCV 07/2015. b. H/o bleeding on Coumadin when INR >5, changed to Eliquis-subsequently discontinued; c. 04/2018 s/p SJM  [place] AVN ablation.   S/P Minimally invasive maze operation for atrial fibrillation 10/22/2015   Complete bilateral atrial lesion set using cryothermy and bipolar radiofrequency ablation with clipping of LA appendage via right mini thoracotomy approach   S/P minimally invasive mitral valve repair 10/22/2015   Complex valvuloplasty including triangular resection of posterior leaflet, artificial Gore-tex neochord placement x6 and 38 mm Sorin Memo 3D Rechord ring annuloplasty via right minithoracotomy approach   Severe mitral regurgitation s/p MVR    a. s/p MV repair 10/2015; b. 11/2018 Echo: Mild MS (mean grad 38mmHg); d. 02/2021 Echo: Mild MR. Mean grad 88mmHg.   Sleep apnea    no longer uses cpap and doesn't use  O2 at home   Stroke [facility])     no vision in right, on occasion sees a pinpoint    Thyroid disorder    TIA (transient ischemic attack)    Tobacco abuse    Past Surgical History:  Procedure Laterality Date   ANKLE SURGERY     AV NODE ABLATION N/A 05/04/2018   Procedure: AV NODE ABLATION;  Surgeon: Thompson Grayer, MD;  Location: [place] [facility];  Service: Cardiovascular;  Laterality: N/A;   BIV PACEMAKER INSERTION CRT-P N/A 05/03/2018   Procedure: BIV PACEMAKER INSERTION CRT-P;  Surgeon: Deboraha Sprang, MD;  Location: [place] [facility];  Service: Cardiovascular;  Laterality: N/A;   CARDIAC CATHETERIZATION N/A 10/03/2015   Procedure: Right and Left Heart Cath and Coronary Angiography;  Surgeon: [name], MD;  Location: [place] [facility];  Service: Cardiovascular;  Laterality: N/A;   COLONOSCOPY     ELECTROPHYSIOLOGIC STUDY N/A 08/18/2015   Procedure: CARDIOVERSION;  Surgeon: Wellington Hampshire, MD;  Location: [facility] ORS;  Service: Cardiovascular;  Laterality: N/A;   MASS EXCISION N/A 03/04/2021   Procedure: EXCISION INNER NASAL CANCER;  Surgeon: Rozetta Nunnery, MD;  Location: [facility] OR;  Service: ENT;  Laterality: N/A;   MINIMALLY INVASIVE MAZE PROCEDURE N/A 10/22/2015   Procedure: MINIMALLY INVASIVE MAZE PROCEDURE;  Surgeon: Rexene Alberts, MD;  Location: [place];  Service: Open Heart Surgery;  Laterality: N/A;   MITRAL VALVE REPAIR Right 10/22/2015   Procedure: MINIMALLY INVASIVE MITRAL VALVE REPAIR (MVR);  Surgeon: Rexene Alberts, MD;  Location: [place];  Service: Open Heart Surgery;  Laterality: Right;   SINUS EXPLORATION     SKIN FULL THICKNESS GRAFT N/A 03/04/2021   Procedure: SKIN GRAFT FULL THICKNESS;  Surgeon: Rozetta Nunnery, MD;  Location: [facility] OR;  Service: ENT;  Laterality: N/A;   TEE WITHOUT CARDIOVERSION N/A 08/18/2015   Procedure: TRANSESOPHAGEAL ECHOCARDIOGRAM (TEE);  Surgeon: Wellington Hampshire, MD;  Location: [facility];  Service: Cardiovascular;  Laterality: N/A;    TEE WITHOUT CARDIOVERSION N/A 10/22/2015   Procedure: TRANSESOPHAGEAL ECHOCARDIOGRAM (TEE);  Surgeon: Rexene Alberts, MD;  Location: [place];  Service: Open Heart Surgery;  Laterality: N/A;   Family History  Problem Relation Age of Onset   Stroke Mother    Irregular heart beat Mother    Heart murmur Brother    Pulmonary embolism Brother    Hypertension Other    Pulmonary embolism Maternal Uncle    Social History   Socioeconomic History   Marital status: Married    Spouse name: [name]   Number of children: 3   Years of education: GED   Highest education level: GED or equivalent  Occupational History   Not on file  Tobacco Use  Smoking status: Every Day    Packs/day: 0.50    Years: 60.00    Pack years: 30.00    Types: Cigarettes    Start date: 39   Smokeless tobacco: Never   Tobacco comments:    Resumed smoking, after quit 01/2018  Vaping Use   Vaping Use: Never used  Substance and Sexual Activity   Alcohol use: Not Currently    Alcohol/week: 1.0 standard drink    Types: 1 Standard drinks or equivalent per week    Comment: occasionally drinks a margarita   Drug use: No   Sexual activity: Not on file  Other Topics Concern   Not on file  Social History Narrative   Right handed    2-3 cups coffee per day         Social Determinants of Health   Financial Resource Strain: High Risk   Difficulty of Paying Living Expenses: Hard  Food Insecurity: No Food Insecurity   Worried About Charity fundraiser in the Last Year: Never true   Ran Out of Food in the Last Year: Never true  Transportation Needs: No Transportation Needs   Lack of Transportation (Medical): No   Lack of Transportation (Non-Medical): No  Physical Activity: Inactive   Days of Exercise per Week: 0 days   Minutes of Exercise per Session: 0 min  Stress: No Stress Concern Present   Feeling of Stress : Only a little  Social Connections: Moderately Isolated   Frequency of Communication with Friends and  Family: More than three times a week   Frequency of Social Gatherings with Friends and Family: More than three times a week   Attends Religious Services: Never   Marine scientist or Organizations: No   Attends Archivist Meetings: Never   Marital Status: Married    Tobacco Counseling Ready to quit: Not Answered Counseling given: Not Answered Tobacco comments: Resumed smoking, after quit 01/2018   Clinical Intake:  Pre-visit preparation completed: Yes  Pain : No/denies pain Pain Score: 0-No pain     Nutritional Status: BMI 25 -29 Overweight Nutritional Risks: None Diabetes: No  How often do you need to have someone help you when you read instructions, pamphlets, or other written materials from your doctor or pharmacy?: 1 - Never  Diabetic?no  Interpreter Needed?: No  Information entered by :: Kirke Shaggy, LPN   Activities of Daily Living In your present state of health, do you have any difficulty performing the following activities: 03/04/2021 03/04/2021  Hearing? [place]? N -  Difficulty concentrating or making decisions? Y -  Walking or climbing stairs? Y -  Dressing or bathing? N -  Doing errands, shopping? - Y  Some recent data might be hidden    Patient Care Team: [name], DO as PCP - General (Family Medicine) [name], MD as PCP - Cardiology (Cardiology) [name], MD as Consulting Physician (Cardiology) Vanita Ingles, RN as Case Manager (General Practice) Rebekah Chesterfield, LCSW as Social Worker (Licensed Clinical Social Worker)  Indicate any recent [place] you may have received from other than [facility] providers in the past year (date may be approximate).     Assessment:   This is a routine wellness examination for [place].  Hearing/Vision screen No results found.  Dietary issues and exercise activities discussed:     Goals Addressed   None    Depression Screen [facility] 2/9 Scores  12/23/2020 06/09/2020 02/21/2020 09/24/2019 06/11/2019  02/27/2019 01/28/2019  PHQ - 2 Score 3 3 5 3 1 5 4   PHQ- 9 Score 8 12 15 11 5 18 15     Fall Risk Fall Risk  12/08/2021 12/23/2020 07/02/2019 02/27/2019 02/21/2018  Falls in the past year? 0 1 1 1  No  Comment - fell down step into the yard - - -  Number falls in past yr: 0 0 0 1 -  Comment - - - 3-4 -  Injury with Fall? 0 0 0 1 -  Risk for fall due to : - Impaired vision;Impaired balance/gait;Medication side effect Other (Comment) Impaired balance/gait -  Risk for fall due to: Comment - - gets dizzy - -  Follow up Falls evaluation completed Falls evaluation completed;Education provided;Falls prevention discussed Falls prevention discussed;Education provided;Falls evaluation completed Falls evaluation completed;Falls prevention discussed;Education provided -    FALL RISK PREVENTION PERTAINING TO THE HOME:  Any stairs in or around the home? Yes  If so, are there any without handrails? No  Home free of loose throw rugs in walkways, pet beds, electrical cords, etc? Yes  Adequate lighting in your home to reduce risk of falls? Yes   ASSISTIVE DEVICES UTILIZED TO PREVENT FALLS:  Life alert? No  Use of a cane, walker or w/c? Yes  Grab bars in the bathroom? No  Shower chair or bench in shower? No  Elevated toilet seat or a handicapped toilet? No   TIMED UP AND GO:  Was the test performed? Yes .  Length of time to ambulate 10 feet: 5 sec.   Gait slow and steady without use of assistive device  Cognitive Function:     6CIT Screen 12/23/2020 02/27/2019 02/21/2018  What Year? 0 points 0 points 0 points  What month? 0 points 0 points 0 points  What time? 0 points 0 points 0 points  Count back from 20 0 points 0 points 0 points  Months in reverse 4 points 2 points 0 points  Repeat phrase 2 points 0 points 0 points  Total Score 6 2 0    Immunizations There is no immunization history for the selected administration types on file for this  patient.  TDAP status: Due, Education has been provided regarding the importance of this vaccine. Advised may receive this vaccine at local pharmacy or Health Dept. Aware to provide a copy of the vaccination record if obtained from local pharmacy or Health Dept. Verbalized acceptance and understanding.  Flu Vaccine status: Declined, Education has been provided regarding the importance of this vaccine but patient still declined. Advised may receive this vaccine at local pharmacy or Health Dept. Aware to provide a copy of the vaccination record if obtained from local pharmacy or Health Dept. Verbalized acceptance and understanding.  Pneumococcal vaccine status: Declined,  Education has been provided regarding the importance of this vaccine but patient still declined. Advised may receive this vaccine at local pharmacy or Health Dept. Aware to provide a copy of the vaccination record if obtained from local pharmacy or Health Dept. Verbalized acceptance and understanding.   Covid-19 vaccine status: Declined, Education has been provided regarding the importance of this vaccine but patient still declined. Advised may receive this vaccine at local pharmacy or Health Dept.or vaccine clinic. Aware to provide a copy of the vaccination record if obtained from local pharmacy or Health Dept. Verbalized acceptance and understanding.  Qualifies for Shingles Vaccine? Yes   Zostavax completed No   Shingrix Completed?: No.    Education has been provided regarding  the importance of this vaccine. Patient has been advised to call insurance company to determine out of pocket expense if they have not yet received this vaccine. Advised may also receive vaccine at local pharmacy or Health Dept. Verbalized acceptance and understanding.  Screening Tests Health Maintenance  Topic Date Due   COVID-19 Vaccine (1) Never done   Pneumonia Vaccine 6+ Years old (1 - PCV) Never done   Hepatitis C Screening  Never done    TETANUS/TDAP  Never done   Zoster Vaccines- Shingrix (1 of 2) Never done   INFLUENZA VACCINE  Never done   HPV VACCINES  Aged Out    Health Maintenance  Health Maintenance Due  Topic Date Due   COVID-19 Vaccine (1) Never done   Pneumonia Vaccine 44+ Years old (1 - PCV) Never done   Hepatitis C Screening  Never done   TETANUS/TDAP  Never done   Zoster Vaccines- Shingrix (1 of 2) Never done   INFLUENZA VACCINE  Never done    Colorectal cancer screening: No longer required.   Lung Cancer Screening: (Low Dose CT Chest recommended if Age 10-80 years, 30 pack-year currently smoking OR have quit w/in 15years.) does not qualify.    Additional Screening:  Hepatitis C Screening: does not qualify; Completed no  Vision Screening: Recommended annual ophthalmology exams for early detection of glaucoma and other disorders of the eye. Is the patient up to date with their annual eye exam?  Yes  Who is the provider or what is the name of the office in which the patient attends annual eye exams? Dr.Woodard If pt is not established with a provider, would they like to be referred to a provider to establish care? No .   Dental Screening: Recommended annual dental exams for proper oral hygiene  Community Resource Referral / Chronic Care Management: CRR required this visit?  No   CCM required this visit?  No      Plan:     I have personally reviewed and noted the following in the patients chart:   Medical and social history Use of alcohol, tobacco or illicit drugs  Current medications and supplements including opioid prescriptions. Patient is not currently taking opioid prescriptions. Functional ability and status Nutritional status Physical activity Advanced directives List of other physicians Hospitalizations, surgeries, and ER visits in previous 12 months Vitals Screenings to include cognitive, depression, and falls Referrals and appointments  In addition, I have reviewed and  discussed with patient certain preventive protocols, quality metrics, and best practice recommendations. A written personalized care plan for preventive services as well as general preventive health recommendations were provided to patient.     Dionisio David, LPN   8/88/2800   Nurse Notes: none

## 2022-01-01 NOTE — Patient Instructions (Signed)
Michael Delacruz , Thank you for taking time to come for your Medicare Wellness Visit. I appreciate your ongoing commitment to your health goals. Please review the following plan we discussed and let me know if I can assist you in the future.   Screening recommendations/referrals: Colonoscopy: 15 years ago, aged out Recommended yearly ophthalmology/optometry visit for glaucoma screening and checkup Recommended yearly dental visit for hygiene and checkup  Vaccinations: Influenza vaccine: n/d Pneumococcal vaccine: n/d Tdap vaccine: n/d Shingles vaccine: n/d   Covid-19: n/d  Advanced directives: no  Conditions/risks identified: none  Next appointment: Follow up in one year for your annual wellness visit.   Preventive Care 24 Years and Older, Male Preventive care refers to lifestyle choices and visits with your health care provider that can promote health and wellness. What does preventive care include? A yearly physical exam. This is also called an annual well check. Dental exams once or twice a year. Routine eye exams. Ask your health care provider how often you should have your eyes checked. Personal lifestyle choices, including: Daily care of your teeth and gums. Regular physical activity. Eating a healthy diet. Avoiding tobacco and drug use. Limiting alcohol use. Practicing safe sex. Taking low doses of aspirin every day. Taking vitamin and mineral supplements as recommended by your health care provider. What happens during an annual well check? The services and screenings done by your health care provider during your annual well check will depend on your age, overall health, lifestyle risk factors, and family history of disease. Counseling  Your health care provider may ask you questions about your: Alcohol use. Tobacco use. Drug use. Emotional well-being. Home and relationship well-being. Sexual activity. Eating habits. History of falls. Memory and ability to understand  (cognition). Work and work Statistician. Screening  You may have the following tests or measurements: Height, weight, and BMI. Blood pressure. Lipid and cholesterol levels. These may be checked every 5 years, or more frequently if you are over 60 years old. Skin check. Lung cancer screening. You may have this screening every year starting at age 26 if you have a 30-pack-year history of smoking and currently smoke or have quit within the past 15 years. Fecal occult blood test (FOBT) of the stool. You may have this test every year starting at age 81. Flexible sigmoidoscopy or colonoscopy. You may have a sigmoidoscopy every 5 years or a colonoscopy every 10 years starting at age 55. Prostate cancer screening. Recommendations will vary depending on your family history and other risks. Hepatitis C blood test. Hepatitis B blood test. Sexually transmitted disease (STD) testing. Diabetes screening. This is done by checking your blood sugar (glucose) after you have not eaten for a while (fasting). You may have this done every 1-3 years. Abdominal aortic aneurysm (AAA) screening. You may need this if you are a current or former smoker. Osteoporosis. You may be screened starting at age 74 if you are at high risk. Talk with your health care provider about your test results, treatment options, and if necessary, the need for more tests. Vaccines  Your health care provider may recommend certain vaccines, such as: Influenza vaccine. This is recommended every year. Tetanus, diphtheria, and acellular pertussis (Tdap, Td) vaccine. You may need a Td booster every 10 years. Zoster vaccine. You may need this after age 42. Pneumococcal 13-valent conjugate (PCV13) vaccine. One dose is recommended after age 38. Pneumococcal polysaccharide (PPSV23) vaccine. One dose is recommended after age 65. Talk to your health care provider about which  screenings and vaccines you need and how often you need them. This  information is not intended to replace advice given to you by your health care provider. Make sure you discuss any questions you have with your health care provider. Document Released: 01/02/2016 Document Revised: 08/25/2016 Document Reviewed: 10/07/2015 Elsevier Interactive Patient Education  2017 [employer] Prevention in the Home Falls can cause injuries. They can happen to people of all ages. There are many things you can do to make your home safe and to help prevent falls. What can I do on the outside of my home? Regularly fix the edges of walkways and driveways and fix any cracks. Remove anything that might make you trip as you walk through a door, such as a raised step or threshold. Trim any bushes or trees on the path to your home. Use bright outdoor lighting. Clear any walking paths of anything that might make someone trip, such as rocks or tools. Regularly check to see if handrails are loose or broken. Make sure that both sides of any steps have handrails. Any raised decks and porches should have guardrails on the edges. Have any leaves, snow, or ice cleared regularly. Use sand or salt on walking paths during winter. Clean up any spills in your garage right away. This includes oil or grease spills. What can I do in the bathroom? Use night lights. Install grab bars by the toilet and in the tub and shower. Do not use towel bars as grab bars. Use non-skid mats or decals in the tub or shower. If you need to sit down in the shower, use a plastic, non-slip stool. Keep the floor dry. Clean up any water that spills on the floor as soon as it happens. Remove soap buildup in the tub or shower regularly. Attach bath mats securely with double-sided non-slip rug tape. Do not have throw rugs and other things on the floor that can make you trip. What can I do in the bedroom? Use night lights. Make sure that you have a light by your bed that is easy to reach. Do not use any sheets or  blankets that are too big for your bed. They should not hang down onto the floor. Have a firm chair that has side arms. You can use this for support while you get dressed. Do not have throw rugs and other things on the floor that can make you trip. What can I do in the kitchen? Clean up any spills right away. Avoid walking on wet floors. Keep items that you use a lot in easy-to-reach places. If you need to reach something above you, use a strong step stool that has a grab bar. Keep electrical cords out of the way. Do not use floor polish or wax that makes floors slippery. If you must use wax, use non-skid floor wax. Do not have throw rugs and other things on the floor that can make you trip. What can I do with my stairs? Do not leave any items on the stairs. Make sure that there are handrails on both sides of the stairs and use them. Fix handrails that are broken or loose. Make sure that handrails are as long as the stairways. Check any carpeting to make sure that it is firmly attached to the stairs. Fix any carpet that is loose or worn. Avoid having throw rugs at the top or bottom of the stairs. If you do have throw rugs, attach them to the floor with carpet  tape. Make sure that you have a light switch at the top of the stairs and the bottom of the stairs. If you do not have them, ask someone to add them for you. What else can I do to help prevent falls? Wear shoes that: Do not have high heels. Have rubber bottoms. Are comfortable and fit you well. Are closed at the toe. Do not wear sandals. If you use a stepladder: Make sure that it is fully opened. Do not climb a closed stepladder. Make sure that both sides of the stepladder are locked into place. Ask someone to hold it for you, if possible. Clearly mark and make sure that you can see: Any grab bars or handrails. First and last steps. Where the edge of each step is. Use tools that help you move around (mobility aids) if they are  needed. These include: Canes. Walkers. Scooters. Crutches. Turn on the lights when you go into a dark area. Replace any light bulbs as soon as they burn out. Set up your furniture so you have a clear path. Avoid moving your furniture around. If any of your floors are uneven, fix them. If there are any pets around you, be aware of where they are. Review your medicines with your doctor. Some medicines can make you feel dizzy. This can increase your chance of falling. Ask your doctor what other things that you can do to help prevent falls. This information is not intended to replace advice given to you by your health care provider. Make sure you discuss any questions you have with your health care provider. Document Released: 10/02/2009 Document Revised: 05/13/2016 Document Reviewed: 01/10/2015 Elsevier Interactive Patient Education  2017 [employer].

## 2022-01-18 ENCOUNTER — Telehealth: Payer: Self-pay

## 2022-01-18 ENCOUNTER — Telehealth: Payer: Medicare HMO

## 2022-01-18 NOTE — Telephone Encounter (Signed)
°  Care Management   Follow Up Note   01/18/2022 Name: Michael Delacruz MRN: [id number] DOB: [birth year]   Referred by: [name], DO Reason for referral : Chronic Care Management (RNCM: Follow up for Chronic Disease Management and Care Coordination Needs )   An unsuccessful telephone outreach was attempted today. The patient was referred to the case management team for assistance with care management and care coordination.   Follow Up Plan: A HIPPA compliant phone message was left for the patient providing contact information and requesting a return call.   Noreene Larsson RN, MSN, [place] Mobile: [phone]

## 2022-01-22 ENCOUNTER — Telehealth: Payer: Self-pay

## 2022-01-22 NOTE — Chronic Care Management (AMB) (Signed)
°  Care Management   Note  01/22/2022 Name: [name] MRN: [id number] DOB: [birth year]  Michael Delacruz is a 81 y.o. year old male who is a primary care patient of [name], DO and is actively engaged with the care management team. I reached out to Colin Benton by phone today to assist with re-scheduling a follow up visit with the RN Case Manager  Follow up plan: Unsuccessful telephone outreach attempt made. A HIPAA compliant phone message was left for the patient providing contact information and requesting a return call.  The care management team will reach out to the patient again over the next 7 days.  If patient returns call to provider office, please advise to call [facility]  at [address], [place], [place], [place] [address] Direct Dial: [phone] [email or web address] Website: [email or web address] .

## 2022-01-26 ENCOUNTER — Ambulatory Visit (INDEPENDENT_AMBULATORY_CARE_PROVIDER_SITE_OTHER): Payer: Medicare HMO

## 2022-01-26 DIAGNOSIS — I428 Other cardiomyopathies: Secondary | ICD-10-CM

## 2022-01-26 LAB — CUP PACEART REMOTE DEVICE CHECK
Battery Remaining Longevity: 55 mo
Battery Remaining Percentage: 62 %
Battery Voltage: 2.99 V
Date Time Interrogation Session: 20230207031434
Implantable Lead Implant Date: 20190515
Implantable Lead Implant Date: 20190515
Implantable Lead Location: 753858
Implantable Lead Location: 753860
Implantable Lead Model: 5076
Implantable Pulse Generator Implant Date: 20190515
Lead Channel Impedance Value: 540 Ohm
Lead Channel Impedance Value: 580 Ohm
Lead Channel Pacing Threshold Amplitude: 0.5 V
Lead Channel Pacing Threshold Amplitude: 0.75 V
Lead Channel Pacing Threshold Pulse Width: 0.5 ms
Lead Channel Pacing Threshold Pulse Width: 0.5 ms
Lead Channel Sensing Intrinsic Amplitude: 9.2 mV
Lead Channel Setting Pacing Amplitude: 2 V
Lead Channel Setting Pacing Amplitude: 2.5 V
Lead Channel Setting Pacing Pulse Width: 0.5 ms
Lead Channel Setting Pacing Pulse Width: 0.5 ms
Lead Channel Setting Sensing Sensitivity: 2 mV
Pulse Gen Model: 3562
Pulse Gen Serial Number: 9427031

## 2022-01-29 NOTE — Progress Notes (Signed)
Remote pacemaker transmission.   

## 2022-02-01 ENCOUNTER — Ambulatory Visit (INDEPENDENT_AMBULATORY_CARE_PROVIDER_SITE_OTHER): Payer: Medicare HMO

## 2022-02-01 DIAGNOSIS — I5020 Unspecified systolic (congestive) heart failure: Secondary | ICD-10-CM

## 2022-02-01 DIAGNOSIS — Z950 Presence of cardiac pacemaker: Secondary | ICD-10-CM

## 2022-02-03 NOTE — Progress Notes (Signed)
EPIC Encounter for ICM Monitoring  Patient Name: Michael Delacruz is a 81 y.o. male Date: 02/03/2022 Primary Care Physican: [name], DO Primary Cardiologist: [name] Electrophysiologist: Vergie Living Pacing: 94%            02/03/2022 Weight: 172-173 lbs                                                            Spoke with patient and heart failure questions reviewed.  Pt asymptomatic for fluid accumulation.   He reports feeling lethargic and weakness on 2/14 and pain under pacemaker with BP 150/91 HR 82.  BP later in the day was 121/77 and symptoms resolved.  Feeling fine today, 2/15.   CorVue thoracic impedance suggesting normal fluid levels with intermittent days with possible fluid accumulation.  No alerts.     Prescribed: Furosemide 40 mg Take 1 tablet (40 mg total) by mouth daily as needed (As needed for weight gain or shortness of breath).   Recommendations:  No changes and encouraged to call if experiencing any fluid symptoms or changes in condition.   Follow-up plan: ICM clinic phone appointment on 03/08/2022.   91 day device clinic remote transmission 02/15/2022.     EP/Cardiology Office Visits:  Recall 04/03/2022 with Dr. [name]. Recall 09/22/2022 with Dr Caryl Comes.    Copy of ICM check sent to Dr. Caryl Comes.  3 month ICM trend: 02/01/2022.    12-14 Month ICM trend:     [name], RN 02/03/2022 10:35 AM

## 2022-02-09 NOTE — Chronic Care Management (AMB) (Signed)
°  Care Management   Note  02/09/2022 Name: Michael Delacruz MRN: [id number] DOB: [birth year]  Michael Delacruz is a 81 y.o. year old male who is a primary care patient of [name], DO and is actively engaged with the care management team. I reached out to Colin Benton by phone today to assist with re-scheduling a follow up visit with the RN Case Manager  Follow up plan: Unsuccessful telephone outreach attempt made. A HIPAA compliant phone message was left for the patient providing contact information and requesting a return call.  The care management team will reach out to the patient again over the next 7 days.  If patient returns call to provider office, please advise to call [facility]  at [address], [place], [place], [place] [address] Direct Dial: [phone] [email or web address]: [email or web address]

## 2022-02-12 NOTE — Telephone Encounter (Signed)
3rd unsuccessful outreach  

## 2022-02-12 NOTE — Chronic Care Management (AMB) (Signed)
°  Care Management   Note  02/12/2022 Name: Michael Delacruz MRN: [id number] DOB: 10/02/1941  Michael Delacruz is a 81 y.o. year old male who is a primary care patient of [name], DO and is actively engaged with the care management team. I reached out to Colin Benton by phone today to assist with re-scheduling a follow up visit with the RN Case Manager  Follow up plan: Unable to make contact on outreach attempts x 3. PCP [name], DO notified via routed documentation in medical record.   Noreene Larsson, [address], [place], [place] [address] Direct Dial: [phone] [email or web address]: [email or web address]

## 2022-02-24 ENCOUNTER — Other Ambulatory Visit: Payer: Self-pay

## 2022-02-24 ENCOUNTER — Ambulatory Visit (INDEPENDENT_AMBULATORY_CARE_PROVIDER_SITE_OTHER): Payer: Medicare HMO | Admitting: Family Medicine

## 2022-02-24 ENCOUNTER — Encounter: Payer: Self-pay | Admitting: Family Medicine

## 2022-02-24 VITALS — Wt 179.0 lb

## 2022-02-24 DIAGNOSIS — N3001 Acute cystitis with hematuria: Secondary | ICD-10-CM | POA: Diagnosis not present

## 2022-02-24 LAB — POCT URINALYSIS DIPSTICK
Bilirubin, UA: NEGATIVE
Glucose, UA: NEGATIVE
Ketones, UA: NEGATIVE
Leukocytes, UA: NEGATIVE
Nitrite, UA: NEGATIVE
Protein, UA: POSITIVE — AB
Spec Grav, UA: 1.025 (ref 1.010–1.025)
Urobilinogen, UA: 0.2 E.U./dL
pH, UA: 5 (ref 5.0–8.0)

## 2022-02-24 MED ORDER — CEPHALEXIN 500 MG PO CAPS: 500.0000 mg | ORAL_CAPSULE | Freq: Three times a day (TID) | ORAL | 0 refills | Status: DC

## 2022-02-24 NOTE — Patient Instructions (Addendum)
1. You have a Urinary Tract Infection - this is very common, your symptoms are reassuring and you should get better within 1 week on the antibiotics ?- Start Keflex '500mg'$  3 times daily for next 7 days, complete entire course, even if feeling better ?- Please drink plenty of fluids, improve hydration over next 1 week ? ?If symptoms worsening, developing nausea / vomiting, worsening back pain, fevers / chills / sweats, then please return for re-evaluation sooner. ? ?If you take AZO OTC - limit this to 2-3 days MAX to avoid affecting kidneys ? ?D-Mannose is a natural supplement that can actually help bind to urinary bacteria and reduce their effectiveness it can help prevent UTI from forming, and may reduce some symptoms. It likely cannot cure an active UTI but it is worth a try and good to prevent them with. Try '500mg'$  twice a day at a full dose if you want, or check package instructions for more info ? ? ?Please schedule a Follow-up Appointment to: Return if symptoms worsen or fail to improve. ? ?If you have any other questions or concerns, please feel free to call the office or send a message through [facility]. You may also schedule an earlier appointment if necessary. ? ?Additionally, you may be receiving a survey about your experience at our office within a few days to 1 week by e-mail or mail. We value your feedback. ? ?Nobie Putnam, DO ?[facility] ?

## 2022-02-24 NOTE — Progress Notes (Signed)
Virtual Visit via Telephone The purpose of this virtual visit is to provide medical care while limiting exposure to the novel coronavirus (COVID19) for both patient and office staff.  Consent was obtained for phone visit:  Yes.   Answered questions that patient had about telehealth interaction:  Yes.   I discussed the limitations, risks, security and privacy concerns of performing an evaluation and management service by telephone. I also discussed with the patient that there may be a patient responsible charge related to this service. The patient expressed understanding and agreed to proceed.  Patient Location: Home Provider Location: [facility] (Office)  Participants in virtual visit: - Patient: Michael Delacruz - CMA: Orinda Kenner, CMA - Provider: Dr Parks Ranger  ---------------------------------------------------------------------- Chief Complaint  Patient presents with   Urinary Tract Infection    S: Reviewed CMA documentation. I have called patient and gathered additional HPI as follows:  Dysuria Reports that symptoms started 8 days ago with dysuria burning urination, he has not had recent UTI or similar problem recently. He admits urinary frequency and difficulty controlling urine, with some urgency at times. And leakage accidents. Not tried any treatment.  History Kidney Stone but he says this does not feel like that  He asks about Colon screening, history of polyps as well.  Denies any fevers, chills, sweats, body ache, cough, shortness of breath, sinus pain or pressure, headache, abdominal pain, diarrhea  Past Medical History:  Diagnosis Date   Anxiety    Arthritis    Ascending aortic aneurysm    a. 11/2018 Echo: Ao root 74m, Asc Ao 4107m b. 02/2021 Echo: Ao root 474mc. 04/2021 CT chest: Asc Ao 4.5cm.   Asthma    Bradycardia    CAD in native artery    a. LHC 09/2015: 40% pCx, 35% mRCA.   Chronic HFrEF (heart failure with reduced ejection  fraction) (HCC)    COPD (chronic obstructive pulmonary disease) (HCC)    Dilation of intestine 01/2015   Fibromyalgia    Frequent headaches    GERD (gastroesophageal reflux disease)    History of blood clots    eye    History of hiatal hernia    History of kidney stones    History of rheumatic fever    Hypertension    [place]  a. 07/2017 Echo: EF reduced to 40-45%; b. 07/2018 Echo: EF 25-30%; c. 11/2018 Echo: EF 30-35%; d. 02/2021 Echo: EF 45-50%, septal and apical septal HK. Mild MR. Sev dil LA. Mod dil RA. Ao root 40m65m Permanent atrial fibrillation (HCC)[place] a. s/p TEE/DCCV 07/2015. b. H/o bleeding on Coumadin when INR >5, changed to Eliquis-subsequently discontinued; c. 04/2018 s/p SJM 3562 Quadra Allure MP DC PPM & AVN ablation.   S/P Minimally invasive maze operation for atrial fibrillation 10/22/2015   Complete bilateral atrial lesion set using cryothermy and bipolar radiofrequency ablation with clipping of LA appendage via right mini thoracotomy approach   S/P minimally invasive mitral valve repair 10/22/2015   Complex valvuloplasty including triangular resection of posterior leaflet, artificial Gore-tex neochord placement x6 and 38 mm Sorin Memo 3D Rechord ring annuloplasty via right minithoracotomy approach   Severe mitral regurgitation s/p MVR    a. s/p MV repair 10/2015; b. 11/2018 Echo: Mild MS (mean grad 5mmH23m d. 02/2021 Echo: Mild MR. Mean grad 5mmHg74m Sleep apnea    no longer uses cpap and doesn't use O2 at home   Stroke (  [place])     no vision in right, on occasion sees a pinpoint    Thyroid disorder    TIA (transient ischemic attack)    Tobacco abuse    Social History   Tobacco Use   Smoking status: Every Day    Packs/day: 0.50    Years: 60.00    Pack years: 30.00    Types: Cigarettes    Start date: 73   Smokeless tobacco: Never   Tobacco comments:    Resumed smoking, after quit 01/2018  Vaping Use   Vaping Use: Never used   Substance Use Topics   Alcohol use: Not Currently    Alcohol/week: 1.0 standard drink    Types: 1 Standard drinks or equivalent per week    Comment: occasionally drinks a margarita   Drug use: No    Current Outpatient Medications:    busPIRone (BUSPAR) 5 MG tablet, Take 1 tablet (5 mg total) by mouth 3 (three) times daily as needed (anxiety)., Disp: 60 tablet, Rfl: 1   cephALEXin (KEFLEX) 500 MG capsule, Take 1 capsule (500 mg total) by mouth 3 (three) times daily. For 7 days, Disp: 21 capsule, Rfl: 0   furosemide (LASIX) 40 MG tablet, Take 1 tablet (40 mg total) by mouth daily as needed (As needed for weight gain or shortness of breath)., Disp: 90 tablet, Rfl: 3   Homeopathic Products (HOMEOPATHIC CALM SL), Place under the tongue. Is using Doterra essential oil of Onguard ingestion daily, Disp: , Rfl:    ipratropium (ATROVENT) 0.06 % nasal spray, Place 2 sprays into both nostrils 4 (four) times daily. As needed, Disp: 15 mL, Rfl: 3   QUEtiapine (SEROQUEL) 25 MG tablet, Take 1 tablet (25 mg total) by mouth at bedtime., Disp: 90 tablet, Rfl: 3  Depression screen [facility] 2/9 02/24/2022 01/01/2022 12/23/2020  Decreased Interest 0 0 0  Down, Depressed, Hopeless 0 0 3  PHQ - 2 Score 0 0 3  Altered sleeping 0 - 0  Tired, decreased energy 3 - 3  Change in appetite 0 - 0  Feeling bad or failure about yourself  0 - 0  Trouble concentrating 2 - 2  Moving slowly or fidgety/restless 0 - 0  Suicidal thoughts 0 - 0  PHQ-9 Score 5 - 8  Difficult doing work/chores Not difficult at all - Somewhat difficult  Some recent data might be hidden    GAD 7 : Generalized Anxiety Score 02/24/2022 02/21/2020 09/24/2019 06/11/2019  Nervous, Anxious, on Edge '3 3 1 '$ 0  Control/stop worrying 3 3 0 1  Worry too much - different things '3 3 1 1  '$ Trouble relaxing '3 3 1 '$ 0  Restless 1 2 0 0  Easily annoyed or irritable 0 0 0 1  Afraid - awful might happen '3 3 1 '$ 0  Total GAD 7 Score '16 17 4 3  '$ Anxiety Difficulty Not difficult  at all Not difficult at all Not difficult at all Not difficult at all    -------------------------------------------------------------------------- O: No physical exam performed due to remote telephone encounter.  Lab results reviewed.  Recent Results (from the past 2160 hour(s))  CUP PACEART REMOTE DEVICE CHECK     Status: None   Collection Time: 01/26/22  3:14 AM  Result Value Ref Range   Date Time Interrogation Session [id number]    Pulse Generator Manufacturer [facility]    Pulse Gen Model 3562 Quadra Allure MP    Pulse Gen Serial Number [id number]    Clinic Name [facility]  Implantable Pulse Generator Type Cardiac Resynch Therapy Pacemaker    Implantable Pulse Generator Implant Date [id number]    Implantable Lead Manufacturer [employer]    Implantable Lead Model 5076 CapSureFix Novus    Implantable Lead Serial Number [id number]    Implantable Lead Implant Date [id number]    Implantable Lead Location Detail 1 UNKNOWN    Implantable Lead Location [phone]    Implantable Lead Manufacturer [facility]    Implantable Lead Model 1458Q Quartet    Implantable Lead Serial Number [id number]    Implantable Lead Implant Date [id number]    Implantable Lead Location Detail 1 UNKNOWN    Implantable Lead Location [phone]    Lead Channel Setting Sensing Sensitivity 2.0 mV   Lead Channel Setting Sensing Adaptation Mode Fixed Pacing    Lead Channel Setting Pacing Pulse Width 0.5 ms   Lead Channel Setting Pacing Amplitude 2.5 V   Lead Channel Setting Pacing Pulse Width 0.5 ms   Lead Channel Setting Pacing Amplitude 2.0 V   Lead Channel Setting Pacing Capture Mode Fixed Pacing    Lead Channel Status     Lead Channel Impedance Value 580 ohm   Lead Channel Pacing Threshold Amplitude 0.5 V   Lead Channel Pacing Threshold Pulse Width 0.5 ms   Lead Channel Status     Lead Channel Impedance Value 540 ohm   Lead Channel Sensing Intrinsic Amplitude 9.2 mV   Lead Channel Pacing Threshold Amplitude 0.75 V    Lead Channel Pacing Threshold Pulse Width 0.5 ms   Battery Status MOS    Battery Remaining Longevity 55 mo   Battery Remaining Percentage 62.0 %   Battery Voltage 2.99 V  POCT Urinalysis Dipstick     Status: Abnormal   Collection Time: 02/24/22  9:40 AM  Result Value Ref Range   Color, UA Yellow    Clarity, UA Cloudy    Glucose, UA Negative Negative   Bilirubin, UA Negative    Ketones, UA Negative    Spec Grav, UA 1.025 1.010 - 1.025   Blood, UA Moderate ++    pH, UA 5.0 5.0 - 8.0   Protein, UA Positive (A) Negative   Urobilinogen, UA 0.2 0.2 or 1.0 E.U./dL   Nitrite, UA Negative    Leukocytes, UA Negative Negative   Appearance     Odor      -------------------------------------------------------------------------- A&P:  Problem List Items Addressed This Visit   None Visit Diagnoses     Acute cystitis with hematuria    -  Primary   Relevant Medications   cephALEXin (KEFLEX) 500 MG capsule   Other Relevant Orders   POCT Urinalysis Dipstick (Completed)   Urine Culture      Clinically consistent with UTI with blood and protein but no leuks or nitrite No recent UTIs or abx courses.  No concern for pyelo today (no systemic symptoms, neg fever, back pain, n/v).  Possible nephrolithiasis with moderate blood but he says pain and symptoms unrelated to past history  Plan: Unable to obtain urine culture today Start Keflex '500mg'$  TID x 7 days Improve PO hydration  RTC if no improvement 1-2 weeks, red flags given to return sooner  We could repeat urine culture if he requested, otherwise, if still not successful may consider switch antibiotic +/- yeast medication vs urology consult.   Meds ordered this encounter  Medications   cephALEXin (KEFLEX) 500 MG capsule    Sig: Take 1 capsule (500 mg total) by mouth 3 (three) times daily. For 7  days    Dispense:  21 capsule    Refill:  0    Follow-up: - Return in 1 week PRN if not improved  Patient verbalizes understanding  with the above medical recommendations including the limitation of remote medical advice.  Specific follow-up and call-back criteria were given for patient to follow-up or seek medical care more urgently if needed.   - Time spent in direct consultation with patient on phone: 10 minutes  Nobie Putnam, [facility] 02/24/2022, 3:52 PM

## 2022-02-25 ENCOUNTER — Ambulatory Visit: Payer: Medicare HMO | Admitting: Family Medicine

## 2022-03-08 ENCOUNTER — Ambulatory Visit (INDEPENDENT_AMBULATORY_CARE_PROVIDER_SITE_OTHER): Payer: Medicare HMO

## 2022-03-08 DIAGNOSIS — Z950 Presence of cardiac pacemaker: Secondary | ICD-10-CM

## 2022-03-08 DIAGNOSIS — I5020 Unspecified systolic (congestive) heart failure: Secondary | ICD-10-CM | POA: Diagnosis not present

## 2022-03-10 NOTE — Progress Notes (Signed)
EPIC Encounter for ICM Monitoring ? ?Patient Name: Michael Delacruz is a 81 y.o. male ?Date: 03/10/2022 ?Primary Care Physican: [name], DO ?Primary Cardiologist: [name] ?Electrophysiologist: Caryl Comes ?Bi-V Pacing: 94%            ?02/03/2022 Weight: 172-173 lbs ?                                                         ?  ?Spoke with patient and heart failure questions reviewed.  Pt asymptomatic for fluid accumulation.   He reports BP today was 167/110 but thinks it is coming down.  Pt reports BP typically runs normal and he is not prescribed any blood pressure medication.   ?  ?CorVue thoracic impedance suggesting normal fluid levels.   ?  ?Prescribed: ?Furosemide 40 mg Take 1 tablet (40 mg total) by mouth daily as needed (As needed for weight gain or shortness of breath). ?  ?Recommendations:  Advised to call Dr Donivan Scull office tomorrow morning to make appointment regarding his BP.  Advised if condition changes to use ER if needed for evaluation.  No changes and encouraged to call if experiencing any fluid symptoms or changes in condition. ?  ?Follow-up plan: ICM clinic phone appointment on 04/12/2022.   91 day device clinic remote transmission 04/27/2022.   ?  ?EP/Cardiology Office Visits:  Recall 04/03/2022 with Dr. [name]. Recall 09/22/2022 with Dr Caryl Comes.  ?  ?Copy of ICM check sent to Dr. Caryl Comes. ?  ?3 month ICM trend: 03/08/2022. ? ? ? ?12-14 Month ICM trend:  ? ? ? ?[name], RN ?03/10/2022 ?5:03 PM ? ?

## 2022-03-24 ENCOUNTER — Ambulatory Visit (INDEPENDENT_AMBULATORY_CARE_PROVIDER_SITE_OTHER): Payer: Medicare HMO | Admitting: Family Medicine

## 2022-03-24 ENCOUNTER — Encounter: Payer: Self-pay | Admitting: Family Medicine

## 2022-03-24 VITALS — BP 118/64 | HR 79 | Ht 73.0 in | Wt 177.6 lb

## 2022-03-24 DIAGNOSIS — I442 Atrioventricular block, complete: Secondary | ICD-10-CM | POA: Diagnosis not present

## 2022-03-24 DIAGNOSIS — R0789 Other chest pain: Secondary | ICD-10-CM | POA: Diagnosis not present

## 2022-03-24 DIAGNOSIS — F331 Major depressive disorder, recurrent, moderate: Secondary | ICD-10-CM | POA: Diagnosis not present

## 2022-03-24 DIAGNOSIS — H00012 Hordeolum externum right lower eyelid: Secondary | ICD-10-CM

## 2022-03-24 DIAGNOSIS — R195 Other fecal abnormalities: Secondary | ICD-10-CM | POA: Diagnosis not present

## 2022-03-24 MED ORDER — SERTRALINE HCL 25 MG PO TABS: 25.0000 mg | ORAL_TABLET | Freq: Every day | ORAL | 2 refills | Status: DC

## 2022-03-24 NOTE — Progress Notes (Signed)
? ?Subjective:  ? ? Patient ID: Michael Delacruz, male    DOB: 07-12-1941, 81 y.o.   MRN: [id number] ? ?CONTRELL BALLENTINE is a 81 y.o. male presenting on 03/24/2022 for Eye Problem ? ? ?HPI ? ?Hypertension ?HFrEF ?BP has been variable ranging from lower range like today up to 161 / 120 ?Episodic chest pains, he has upcoming apt with Cardiologist. ? ?Right Eye External Hordeolum, lower eyelid ?Here today for referral to eye doctor. ?Reports large swollen stye that is bothering him. No redness or drainage. ? ?Diarrhea / Mixed Loose ?Additional new complaint but has been present for a while episodic flares and symptoms. Not worse after antibiotic course recently. ? ?Major Depression, recurrent moderate ?Anxiety ?Describes significant issue with depression symptoms, impacting his physical. ?Currently on Seroquel '25mg'$  nightly for sleep - needs refill, he ran out, but has refills on file at pharmacy ?Previously on Buspar and Alprazolam PRN, not taking often ?He has quit substances, alcohol ? ? ? ? ? ?  03/24/2022  ?  1:13 PM 02/24/2022  ?  9:12 AM 01/01/2022  ?  2:16 PM  ?Depression screen PHQ 2/9  ?Decreased Interest 2 0 0  ?Down, Depressed, Hopeless 2 0 0  ?PHQ - 2 Score 4 0 0  ?Altered sleeping 1 0   ?Tired, decreased energy 3 3   ?Change in appetite 0 0   ?Feeling bad or failure about yourself  0 0   ?Trouble concentrating 1 2   ?Moving slowly or fidgety/restless 0 0   ?Suicidal thoughts 0 0   ?PHQ-9 Score 9 5   ?Difficult doing work/chores Somewhat difficult Not difficult at all   ? ? ?  02/24/2022  ?  9:12 AM 02/21/2020  ? 11:14 AM 09/24/2019  ?  8:30 AM 06/11/2019  ?  2:57 PM  ?GAD 7 : Generalized Anxiety Score  ?Nervous, Anxious, on Edge '3 3 1 '$ 0  ?Control/stop worrying 3 3 0 1  ?Worry too much - different things '3 3 1 1  '$ ?Trouble relaxing '3 3 1 '$ 0  ?Restless 1 2 0 0  ?Easily annoyed or irritable 0 0 0 1  ?Afraid - awful might happen '3 3 1 '$ 0  ?Total GAD 7 Score '16 17 4 3  '$ ?Anxiety Difficulty Not difficult at all Not difficult at  all Not difficult at all Not difficult at all  ? ? ? ? ?Social History  ? ?Tobacco Use  ? Smoking status: Every Day  ?  Packs/day: 0.50  ?  Years: 60.00  ?  Pack years: 30.00  ?  Types: Cigarettes  ?  Start date: 50  ? Smokeless tobacco: Never  ? Tobacco comments:  ?  Resumed smoking, after quit 01/2018  ?Vaping Use  ? Vaping Use: Never used  ?Substance Use Topics  ? Alcohol use: Not Currently  ?  Alcohol/week: 1.0 standard drink  ?  Types: 1 Standard drinks or equivalent per week  ?  Comment: occasionally drinks a margarita  ? Drug use: No  ? ? ?Review of Systems ?Per HPI unless specifically indicated above ? ?   ?Objective:  ?  ?BP 118/64   Pulse 79   Ht '6\' 1"'$  (1.854 m)   Wt 177 lb 9.6 oz (80.6 kg)   SpO2 100%   BMI 23.43 kg/m?   ?Wt Readings from Last 3 Encounters:  ?03/24/22 177 lb 9.6 oz (80.6 kg)  ?02/24/22 179 lb (81.2 kg)  ?01/01/22 179 lb 3.2 oz (81.3  kg)  ?  ?Physical Exam ?Vitals and nursing note reviewed.  ?Constitutional:   ?   General: He is not in acute distress. ?   Appearance: Normal appearance. He is well-developed. He is not diaphoretic.  ?   Comments: Well-appearing, comfortable, cooperative  ?HENT:  ?   Head: Normocephalic and atraumatic.  ?Eyes:  ?   General:     ?   Right eye: No discharge.     ?   Left eye: No discharge.  ?   Conjunctiva/sclera: Conjunctivae normal.  ?Cardiovascular:  ?   Rate and Rhythm: Normal rate.  ?Pulmonary:  ?   Effort: Pulmonary effort is normal.  ?Skin: ?   General: Skin is warm and dry.  ?   Findings: No erythema or rash.  ?Neurological:  ?   Mental Status: He is alert and oriented to person, place, and time.  ?Psychiatric:     ?   Mood and Affect: Mood normal.     ?   Behavior: Behavior normal.     ?   Thought Content: Thought content normal.  ?   Comments: Well groomed, good eye contact, normal speech and thoughts  ? ? ? ? ?Results for orders placed or performed in visit on 02/24/22  ?POCT Urinalysis Dipstick  ?Result Value Ref Range  ? Color, UA Yellow   ?  Clarity, UA Cloudy   ? Glucose, UA Negative Negative  ? Bilirubin, UA Negative   ? Ketones, UA Negative   ? Spec Grav, UA 1.025 1.010 - 1.025  ? Blood, UA Moderate ++   ? pH, UA 5.0 5.0 - 8.0  ? Protein, UA Positive (A) Negative  ? Urobilinogen, UA 0.2 0.2 or 1.0 E.U./dL  ? Nitrite, UA Negative   ? Leukocytes, UA Negative Negative  ? Appearance    ? Odor    ? ?   ?Assessment & Plan:  ? ?Problem List Items Addressed This Visit   ? ? Pulmonary HTN (HCC) (Chronic)  ? Depression, major, recurrent, moderate ([place])  ? Relevant Medications  ? sertraline (ZOLOFT) 25 MG tablet  ? CHB (complete heart block) (HCC)  ? Centrilobular emphysema ([place])  ? ?Other Visit Diagnoses   ? ? Hordeolum externum of right lower eyelid    -  Primary  ? Relevant Orders  ? Ambulatory referral to Ophthalmology  ? Atypical chest pain      ? Loose stools      ? ?  ?  ?Referral to Ophthalmologist for  ?[facility] ?[address] [place], [place] [address] ?Phone: [phone] ?[email or web address] ? ?Major Depression recurrent moderate ?Anxiety ?Mood has been linked to other health conditions ?Suboptimal therapy now, has deferred management in past. ?No longer on Buspar/Alaprazolam PRN but has if need ?On Seroquel '25mg'$  QHS for sleep ? ?Start the Sertraline '25mg'$  daily for mood/depression - adjust dose in future as needed ? ?ED ?Ask Cardiologist about safety of taking Sildenafil (Viagra) first, and we can check testosterone in future. ? ?For loose stools ?- Increase fiber in diet (high fiber foods = vegetables, leafy greens, oats/grains) ?- May take OTC Fiber supplement (metamucil powder or pill/gummy) ?- May try OTC Probiotic ? ?Return to Cardiology as planned, return criteria for cardiac symptoms ? ?Meds ordered this encounter  ?Medications  ? sertraline (ZOLOFT) 25 MG tablet  ?  Sig: Take 1 tablet (25 mg total) by mouth daily.  ?  Dispense:  30 tablet  ?  Refill:  2  ? ? ? ?  Follow up plan: ?Return if symptoms worsen or fail to  improve. ? ?Nobie Putnam, DO ?[facility] ?[facility] ?03/24/2022, 9:31 AM ?

## 2022-03-24 NOTE — Patient Instructions (Addendum)
Thank you for coming to the office today. ? ?[facility] ?[address], [place], [place] [address] ?Phone: [phone] ?[email or web address] ? ?Start the Sertraline '25mg'$  daily for mood/depression ? ?Go pick up a refill Seroquel '25mg'$  at pharmacy ? ?Ask Cardiologist about safety of taking Sildenafil (Viagra) first, and we can check testosterone in future. ? ?For loose stools ?- Increase fiber in diet (high fiber foods = vegetables, leafy greens, oats/grains) ?- May take OTC Fiber supplement (metamucil powder or pill/gummy) ?- May try OTC Probiotic ? ? ?Please schedule a Follow-up Appointment to: Return if symptoms worsen or fail to improve. ? ?If you have any other questions or concerns, please feel free to call the office or send a message through [place]. You may also schedule an earlier appointment if necessary. ? ?Additionally, you may be receiving a survey about your experience at our office within a few days to 1 week by e-mail or mail. We value your feedback. ? ?Nobie Putnam, DO ?[facility] ?

## 2022-04-06 ENCOUNTER — Ambulatory Visit (INDEPENDENT_AMBULATORY_CARE_PROVIDER_SITE_OTHER): Payer: Medicare HMO

## 2022-04-06 DIAGNOSIS — I428 Other cardiomyopathies: Secondary | ICD-10-CM

## 2022-04-06 DIAGNOSIS — I5020 Unspecified systolic (congestive) heart failure: Secondary | ICD-10-CM

## 2022-04-06 LAB — ECHOCARDIOGRAM COMPLETE
AR max vel: 3.15 cm2
AV Area VTI: 3.17 cm2
AV Area mean vel: 3.25 cm2
AV Mean grad: 2 mmHg
AV Peak grad: 2.8 mmHg
Ao pk vel: 0.84 m/s
Calc EF: 46.5 %
MV VTI: 1.19 cm2
S' Lateral: 3.8 cm
Single Plane A2C EF: 47 %
Single Plane A4C EF: 43.2 %

## 2022-04-07 ENCOUNTER — Telehealth: Payer: Self-pay | Admitting: Emergency Medicine

## 2022-04-07 NOTE — Telephone Encounter (Signed)
Called and spoke with patient. Results reviewed with patient, pt verbalized understanding,  questions (if any) answered.   ?

## 2022-04-07 NOTE — Telephone Encounter (Signed)
-----   Message from [name], MD sent at 04/06/2022  5:15 PM EDT ----- ?Echocardiogram ?Ejection fraction appears similar to prior study ?Stable valvular heart disease ?Mitral valve repair intact ?Stable size of dilated aorta ? ?

## 2022-04-15 ENCOUNTER — Other Ambulatory Visit: Payer: Self-pay | Admitting: Family Medicine

## 2022-04-15 DIAGNOSIS — F331 Major depressive disorder, recurrent, moderate: Secondary | ICD-10-CM

## 2022-04-16 NOTE — Telephone Encounter (Signed)
Requested medication (s) are due for refill today: no ? ?Requested medication (s) are on the active medication list: yes ? ?Last refill:  03/24/22 #30 2 RF ? ?Future visit scheduled: no ? ?Notes to clinic:  pt requesting 90 day refill ? ? ?Requested Prescriptions  ?Pending Prescriptions Disp Refills  ? sertraline (ZOLOFT) 25 MG tablet [Pharmacy Med Name: SERTRALINE HCL 25 MG TABLET] 90 tablet 1  ?  Sig: Take 1 tablet (25 mg total) by mouth daily.  ?  ? Not Delegated - Psychiatry:  Antidepressants - SSRI - sertraline Failed - 04/15/2022 11:31 AM  ?  ?  Failed - This refill cannot be delegated  ?  ?  Failed - AST in normal range and within 360 days  ?  AST  ?Date Value Ref Range Status  ?01/31/2021 100 (H) 15 - 41 U/L Final  ? ?SGOT(AST)  ?Date Value Ref Range Status  ?01/24/2015 13 (L) 15 - 37 Unit/L Final  ?  ?  ?  ?  Failed - ALT in normal range and within 360 days  ?  ALT  ?Date Value Ref Range Status  ?01/31/2021 77 (H) 0 - 44 U/L Final  ? ?SGPT (ALT)  ?Date Value Ref Range Status  ?01/24/2015 19 14 - 63 U/L Final  ?  ?  ?  ?  Passed - Completed PHQ-2 or PHQ-9 in the last 360 days  ?  ?  Passed - Valid encounter within last 6 months  ?  Recent Outpatient Visits   ? ?      ? 3 weeks ago Hordeolum externum of right lower eyelid  ? [place], DO  ? 1 month ago Acute cystitis with hematuria  ? [place], DO  ? 4 months ago Neck pain on right side  ? [place], DO  ? 1 year ago Depression, major, recurrent, moderate ([place])  ? [place], DO  ? 1 year ago COVID-19 virus infection  ? [place], DO  ? ?  ?  ?Future Appointments   ? ?        ? In 2 weeks Gollan, Kathlene November, MD [facility]  ? ?  ? ? ?  ?  ?  ? ? ? ? ?

## 2022-04-25 ENCOUNTER — Emergency Department (HOSPITAL_COMMUNITY)
Admission: EM | Admit: 2022-04-25 | Discharge: 2022-04-25 | Discharge status: Against Medical Advice | Payer: Medicare HMO | Attending: Emergency Medicine | Admitting: Emergency Medicine

## 2022-04-25 ENCOUNTER — Emergency Department (HOSPITAL_COMMUNITY): Payer: Medicare HMO

## 2022-04-25 ENCOUNTER — Other Ambulatory Visit: Payer: Self-pay

## 2022-04-25 ENCOUNTER — Encounter (HOSPITAL_COMMUNITY): Payer: Self-pay

## 2022-04-25 DIAGNOSIS — F17200 Nicotine dependence, unspecified, uncomplicated: Secondary | ICD-10-CM | POA: Diagnosis not present

## 2022-04-25 DIAGNOSIS — I701 Atherosclerosis of renal artery: Secondary | ICD-10-CM | POA: Diagnosis not present

## 2022-04-25 DIAGNOSIS — I2510 Atherosclerotic heart disease of native coronary artery without angina pectoris: Secondary | ICD-10-CM | POA: Insufficient documentation

## 2022-04-25 DIAGNOSIS — R55 Syncope and collapse: Secondary | ICD-10-CM | POA: Diagnosis not present

## 2022-04-25 DIAGNOSIS — R42 Dizziness and giddiness: Secondary | ICD-10-CM | POA: Diagnosis not present

## 2022-04-25 DIAGNOSIS — N210 Calculus in bladder: Secondary | ICD-10-CM | POA: Diagnosis not present

## 2022-04-25 DIAGNOSIS — R0789 Other chest pain: Secondary | ICD-10-CM | POA: Diagnosis not present

## 2022-04-25 DIAGNOSIS — I700 Atherosclerosis of aorta: Secondary | ICD-10-CM | POA: Diagnosis not present

## 2022-04-25 DIAGNOSIS — J449 Chronic obstructive pulmonary disease, unspecified: Secondary | ICD-10-CM | POA: Diagnosis not present

## 2022-04-25 DIAGNOSIS — I712 Thoracic aortic aneurysm, without rupture, unspecified: Secondary | ICD-10-CM | POA: Diagnosis not present

## 2022-04-25 DIAGNOSIS — R944 Abnormal results of kidney function studies: Secondary | ICD-10-CM | POA: Insufficient documentation

## 2022-04-25 DIAGNOSIS — I10 Essential (primary) hypertension: Secondary | ICD-10-CM | POA: Diagnosis not present

## 2022-04-25 DIAGNOSIS — R0602 Shortness of breath: Secondary | ICD-10-CM | POA: Insufficient documentation

## 2022-04-25 DIAGNOSIS — K769 Liver disease, unspecified: Secondary | ICD-10-CM | POA: Diagnosis not present

## 2022-04-25 DIAGNOSIS — R110 Nausea: Secondary | ICD-10-CM | POA: Diagnosis not present

## 2022-04-25 DIAGNOSIS — J432 Centrilobular emphysema: Secondary | ICD-10-CM | POA: Diagnosis not present

## 2022-04-25 DIAGNOSIS — R079 Chest pain, unspecified: Secondary | ICD-10-CM

## 2022-04-25 LAB — TROPONIN I (HIGH SENSITIVITY)
Troponin I (High Sensitivity): 7 ng/L (ref <18)
Troponin I (High Sensitivity): 8 ng/L (ref <18)

## 2022-04-25 LAB — CBC
HCT: 43.9 % (ref 39.0–52.0)
Hemoglobin: 14.2 g/dL (ref 13.0–17.0)
MCH: 30.9 pg (ref 26.0–34.0)
MCHC: 32.3 g/dL (ref 30.0–36.0)
MCV: 95.4 fL (ref 80.0–100.0)
Platelets: 116 10*3/uL — ABNORMAL LOW (ref 150–400)
RBC: 4.6 MIL/uL (ref 4.22–5.81)
RDW: 13 % (ref 11.5–15.5)
WBC: 5.1 10*3/uL (ref 4.0–10.5)
nRBC: 0 % (ref 0.0–0.2)

## 2022-04-25 LAB — BASIC METABOLIC PANEL
Anion gap: 6 (ref 5–15)
BUN: 11 mg/dL (ref 8–23)
CO2: 24 mmol/L (ref 22–32)
Calcium: 8.9 mg/dL (ref 8.9–10.3)
Chloride: 112 mmol/L — ABNORMAL HIGH (ref 98–111)
Creatinine, Ser: 1.29 mg/dL — ABNORMAL HIGH (ref 0.61–1.24)
GFR, Estimated: 57 mL/min — ABNORMAL LOW (ref >60)
Glucose, Bld: 99 mg/dL (ref 70–99)
Potassium: 4 mmol/L (ref 3.5–5.1)
Sodium: 142 mmol/L (ref 135–145)

## 2022-04-25 LAB — BRAIN NATRIURETIC PEPTIDE: B Natriuretic Peptide: 99.2 pg/mL (ref 0.0–100.0)

## 2022-04-25 LAB — MAGNESIUM: Magnesium: 1.9 mg/dL (ref 1.7–2.4)

## 2022-04-25 MED ORDER — IOHEXOL 350 MG/ML SOLN
100.0000 mL | Freq: Once | INTRAVENOUS | Status: AC | PRN
  Administered 2022-04-25: 100 mL via INTRAVENOUS

## 2022-04-25 NOTE — ED Notes (Signed)
[employer] Jude pace maker interrogated.  

## 2022-04-25 NOTE — ED Triage Notes (Signed)
Coming from work complains of sudden onset of chest pain, dizziness, nausea blurred vision and lightheadedness.  Patient hx of stroke with right eye vision deficit.  ?

## 2022-04-25 NOTE — Discharge Instructions (Addendum)
Your evaluation today is overall been reassuring.  Your EKG and heart enzymes do not suggest a heart attack and your CT scan shows no changes to your thoracic aortic aneurysm.  There is a cyst or mass on your kidney that you will need to follow-up with your primary care doctor for further evaluation of. ? ?Please call your cardiologist office on Monday to schedule close follow-up and if you have any return of feeling lightheaded or like you are going to pass out, chest pain or shortness of breath return to the emergency department immediately. ? ? ?

## 2022-04-25 NOTE — ED Provider Notes (Signed)
?[facility] ?Provider Note ? ? ?CSN: [id number] ?Arrival date & time: 04/25/22  1534 ? ?  ? ?History ? ?Chief Complaint  ?Patient presents with  ? Chest Pain  ? ? ?Michael Delacruz is a 81 y.o. male. ? ?Michael Delacruz is a 81 y.o. male with a history of COPD, CAD, cardiomyopathy, A-fib, thoracic aneurysm, mitral regurg, fibromyalgia, hypertension, tobacco abuse, who presents to the emergency department for evaluation of chest pain, shortness of breath and near syncope.  Patient reports just prior to arrival he was standing at a table when he had a sudden onset of lightheadedness and felt very hot and sweaty like he was going to pass out.  With this he started experiencing shortness of breath as well as some sharp chest pains.  He reports symptoms lasted about 5 minutes in total.  He has had some similar episodes previously but never so prolonged.  Reports chest pain was radiating back into the left side of his back.  He reports initially chest pain was severe, he was given aspirin with EMS and reports pain has significantly improved.  Patient is followed by Dr. [name] and Dr. Caryl Comes with cardiology ? ?The history is provided by the patient.  ? ?  ? ?Home Medications ?Prior to Admission medications   ?Medication Sig Start Date End Date Taking? Authorizing Provider  ?busPIRone (BUSPAR) 5 MG tablet Take 1 tablet (5 mg total) by mouth 3 (three) times daily as needed (anxiety). 02/12/21   [name], DO  ?furosemide (LASIX) 40 MG tablet Take 1 tablet (40 mg total) by mouth daily as needed (As needed for weight gain or shortness of breath). 02/09/21   Theora Gianotti, NP  ?Homeopathic Products (HOMEOPATHIC CALM SL) Place under the tongue. Is using Doterra essential oil of Onguard ingestion daily    [provider]  ?ipratropium (ATROVENT) 0.06 % nasal spray Place 2 sprays into both nostrils 4 (four) times daily. As needed 02/06/21   [name], DO  ?QUEtiapine (SEROQUEL) 25 MG tablet Take 1 tablet (25 mg total) by mouth at bedtime. 12/08/21   Karamalegos, Devonne Doughty, DO  ?sertraline (ZOLOFT) 25 MG tablet TAKE 1 TABLET (25 MG TOTAL) BY MOUTH DAILY. 04/16/22   [name], DO  ?   ? ?Allergies    ?Codeine, Digoxin and related, Macrodantin [nitrofurantoin macrocrystal], and Morphine and related   ? ?Review of Systems   ?Review of Systems  ?Constitutional:  Positive for diaphoresis. Negative for chills and fever.  ?HENT: Negative.    ?Respiratory:  Positive for shortness of breath. Negative for cough.   ?Cardiovascular:  Positive for chest pain. Negative for palpitations and leg swelling.  ?Gastrointestinal:  Positive for nausea. Negative for abdominal pain and vomiting.  ?Genitourinary:  Negative for dysuria and frequency.  ?Neurological:  Positive for light-headedness. Negative for syncope.  ? ?Physical Exam ?Updated Vital Signs ?BP (!) 129/58 (BP Location: Left Arm)   Pulse 84   Temp 97.8 ?F (36.6 ?C) (Oral)   Resp 18   Ht '6\' 1"'$  (1.854 m)   Wt 78 kg   SpO2 99%   BMI 22.69 kg/m?  ?Physical Exam ?Vitals and nursing note reviewed.  ?Constitutional:   ?   General: He is not in acute distress. ?   Appearance: Normal appearance. He is well-developed. He is not diaphoretic.  ?HENT:  ?   Head: Normocephalic and atraumatic.  ?Eyes:  ?   General:     ?  Right eye: No discharge.     ?   Left eye: No discharge.  ?   Pupils: Pupils are equal, round, and reactive to light.  ?Cardiovascular:  ?   Rate and Rhythm: Normal rate and regular rhythm.  ?   Pulses: Normal pulses.     ?     Radial pulses are 2+ on the right side and 2+ on the left side.  ?     Dorsalis pedis pulses are 2+ on the right side and 2+ on the left side.  ?   Heart sounds: Normal heart sounds.  ?Pulmonary:  ?   Effort: Pulmonary effort is normal. No respiratory distress.  ?   Breath sounds: Normal breath sounds. No wheezing or rales.  ?   Comments: Respirations equal and  unlabored, patient able to speak in full sentences, lungs clear to auscultation bilaterally  ?Abdominal:  ?   General: Bowel sounds are normal. There is no distension.  ?   Palpations: Abdomen is soft. There is no mass.  ?   Tenderness: There is no abdominal tenderness. There is no guarding.  ?   Comments: Abdomen soft, nondistended, nontender to palpation in all quadrants without guarding or peritoneal signs  ?Musculoskeletal:     ?   General: No deformity.  ?   Cervical back: Neck supple.  ?   Right lower leg: No tenderness. No edema.  ?   Left lower leg: No tenderness. No edema.  ?Skin: ?   General: Skin is warm and dry.  ?   Capillary Refill: Capillary refill takes less than 2 seconds.  ?Neurological:  ?   Mental Status: He is alert and oriented to person, place, and time.  ?   Coordination: Coordination normal.  ?   Comments: Speech is clear, able to follow commands ?CN III-XII intact ?Normal strength in upper and lower extremities bilaterally including dorsiflexion and plantar flexion, strong and equal grip strength ?Sensation normal to light and sharp touch ?Moves extremities without ataxia, coordination intact  ?Psychiatric:     ?   Mood and Affect: Mood normal.     ?   Behavior: Behavior normal.  ? ? ?ED Results / Procedures / Treatments   ?Labs ?(all labs ordered are listed, but only abnormal results are displayed) ?Labs Reviewed  ?BASIC METABOLIC PANEL - Abnormal; Notable for the following components:  ?    Result Value  ? Chloride 112 (*)   ? Creatinine, Ser 1.29 (*)   ? GFR, Estimated 57 (*)   ? All other components within normal limits  ?CBC - Abnormal; Notable for the following components:  ? Platelets 116 (*)   ? All other components within normal limits  ?MAGNESIUM  ?BRAIN NATRIURETIC PEPTIDE  ?TROPONIN I (HIGH SENSITIVITY)  ?TROPONIN I (HIGH SENSITIVITY)  ? ? ?EKG ?EKG Interpretation ? ?Date/Time:  Sunday Apr 25 2022 15:39:47 EDT ?Ventricular Rate:  80 ?PR Interval:  183 ?QRS Duration: 159 ?QT  Interval:  448 ?QTC Calculation: 517 ?R Axis:   175 ?Text Interpretation: Sinus rhythm Multiple ventricular premature complexes IVCD, consider atypical RBBB LVH with secondary repolarization abnormality Probable lateral infarct, age indeterminate No significant change was found Confirmed by Ezequiel Essex [phone]) on 04/25/2022 3:50:09 PM ? ?Radiology ?DG Chest Portable 1 View ? ?Result Date: 04/25/2022 ?CLINICAL DATA:  Chest pain EXAM: PORTABLE CHEST 1 VIEW COMPARISON:  January 30, 2021 FINDINGS: Stable cardiomegaly. The hila and mediastinum are unchanged. No pneumothorax. Mild edema. Stable pacemaker within visualize  limits. No other acute abnormalities. IMPRESSION: Cardiomegaly and mild edema.  No other acute abnormalities. Electronically Signed   By: Dorise Bullion III M.D.   On: 04/25/2022 16:43  ? ?CT Angio Chest/Abd/Pel for Dissection W and/or Wo Contrast ? ?Result Date: 04/25/2022 ?CLINICAL DATA:  Chest pain EXAM: CT ANGIOGRAPHY CHEST, ABDOMEN AND PELVIS TECHNIQUE: Non-contrast CT of the chest was initially obtained. Multidetector CT imaging through the chest, abdomen and pelvis was performed using the standard protocol during bolus administration of intravenous contrast. Multiplanar reconstructed images and MIPs were obtained and reviewed to evaluate the vascular anatomy. RADIATION DOSE REDUCTION: This exam was performed according to the departmental dose-optimization program which includes automated exposure control, adjustment of the mA and/or kV according to patient size and/or use of iterative reconstruction technique. CONTRAST:  156m OMNIPAQUE IOHEXOL 350 MG/ML SOLN COMPARISON:  CT chest angio dated March 04, 2018; lung cancer screening CT dated Apr 29, 2021 FINDINGS: CTA CHEST FINDINGS Cardiovascular: Normal heart size. No pericardial effusion. Partially visualized left chest wall pacer with leads seen in the right ventricle and vein overlying the left ventricle. Left atrial occlusion device. Mitral  annular calcifications. Left main and three-vessel coronary artery calcifications. Mediastinum/Nodes: Esophagus and thyroid are unremarkable. Mildly enlarged right lower paratracheal lymph node, measuring 4 cm in short axi

## 2022-04-26 ENCOUNTER — Ambulatory Visit (INDEPENDENT_AMBULATORY_CARE_PROVIDER_SITE_OTHER): Payer: Medicare HMO

## 2022-04-26 DIAGNOSIS — Z950 Presence of cardiac pacemaker: Secondary | ICD-10-CM | POA: Diagnosis not present

## 2022-04-26 DIAGNOSIS — I5020 Unspecified systolic (congestive) heart failure: Secondary | ICD-10-CM | POA: Diagnosis not present

## 2022-04-27 ENCOUNTER — Ambulatory Visit (INDEPENDENT_AMBULATORY_CARE_PROVIDER_SITE_OTHER): Payer: Medicare HMO

## 2022-04-27 DIAGNOSIS — I4821 Permanent atrial fibrillation: Secondary | ICD-10-CM | POA: Diagnosis not present

## 2022-04-27 LAB — CUP PACEART REMOTE DEVICE CHECK
Battery Remaining Longevity: 50 mo
Battery Remaining Percentage: 59 %
Battery Voltage: 2.98 V
Date Time Interrogation Session: 20230508093147
Implantable Lead Implant Date: 20190515
Implantable Lead Implant Date: 20190515
Implantable Lead Location: 753858
Implantable Lead Location: 753860
Implantable Lead Model: 5076
Implantable Pulse Generator Implant Date: 20190515
Lead Channel Impedance Value: 580 Ohm
Lead Channel Impedance Value: 580 Ohm
Lead Channel Pacing Threshold Amplitude: 0.5 V
Lead Channel Pacing Threshold Amplitude: 0.75 V
Lead Channel Pacing Threshold Pulse Width: 0.5 ms
Lead Channel Pacing Threshold Pulse Width: 0.5 ms
Lead Channel Sensing Intrinsic Amplitude: 9.7 mV
Lead Channel Setting Pacing Amplitude: 2 V
Lead Channel Setting Pacing Amplitude: 2.5 V
Lead Channel Setting Pacing Pulse Width: 0.5 ms
Lead Channel Setting Pacing Pulse Width: 0.5 ms
Lead Channel Setting Sensing Sensitivity: 2 mV
Pulse Gen Model: 3562
Pulse Gen Serial Number: 9427031

## 2022-04-28 NOTE — Progress Notes (Signed)
EPIC Encounter for ICM Monitoring ? ?Patient Name: Michael Delacruz is a 81 y.o. male ?Date: 04/28/2022 ?Primary Care Physican: [name], DO ?Primary Cardiologist: [name] ?Electrophysiologist: Caryl Comes ?Bi-V Pacing: 94%            ?02/03/2022 Weight: 172-173 lbs ?                                                         ?  ?Spoke with patient and heart failure questions reviewed.  Pt asymptomatic for fluid accumulation.   ER visit 5/7 for sweating, dizzy, feeling faint, couldn't walk for 15 minutes and headache but no definitive dx made and feeling better now. ?  ?CorVue thoracic impedance suggesting normal fluid levels.   ?  ?Prescribed: ?Furosemide 40 mg Take 1 tablet (40 mg total) by mouth daily as needed (As needed for weight gain or shortness of breath). ?  ?Recommendations:  No changes and encouraged to call if experiencing any fluid symptoms. ?  ?Follow-up plan: ICM clinic phone appointment on 05/31/2022.   91 day device clinic remote transmission 07/27/2022.   ?  ?EP/Cardiology Office Visits:  05/04/2022 with Dr. [name]. Recall 09/22/2022 with Dr Caryl Comes.  ?  ?Copy of ICM check sent to Dr. Caryl Comes. ? ?3 month ICM trend: 04/26/2022. ? ? ? ?12-14 Month ICM trend:  ? ? ? ?[name], RN ?04/28/2022 ?9:54 AM ? ?

## 2022-05-03 NOTE — Progress Notes (Signed)
?  ?  ? ? ?Evaluation Performed:  Follow-up visit ? ?Date:  05/04/2022  ? ?ID:  Michael Delacruz, DOB 1941-05-26, MRN [id number] ? ?Patient Location:  ?[place] ?[place] [address]  ? ?Provider location:   ?[place], [employer] office ? ?PCP:  [name], DO  ?Cardiologist:  [facility] ? ?Chief Complaint  ?Patient presents with  ? 6 month follow up   ?  Patient was at [facility] with syncope on May 7th. Medications reviewed by the patient verbally.   ? ? ?History of Present Illness:   ? ?Michael Delacruz is a 81 y.o. male  past medical history of ?long smoking history for 50 years with underlying COPD,  ?severe mitral valve regurgitation on echocardiogram,  ?prolapse of posterior leaflet,  ?moderate pulmonary hypertension  ?s/p successful MR repair by right thoracotomy by Dr. Roxy Manns 11/ 2016 ?Smokes 1 ppd ?PAF on warfarin had bleeding, now on asa, s/p Maze ?Surgical report indicates clipping of left atrial appendage, Atricure left atrial clip, size 45 mm ?Previous history of retinal bleeding felt exacerbated by warfarin , requiring surgery ?2 residual right eye  vision deficits ?Atrial flutter ?EF 40 to 45% ?Dilated ascending aorta 4.5 cm on CT, unchaged 2023 ?Who presents for follow up of his MR repair and atrial flutter ? ?LOV October 2022 ?Seen in the emergency room Apr 25, 2022 for near syncope, shortness of breath ?Was in antique store, reports he had been shopping for around 4 hours, had been on his feet, had not had much to drink that day, ?" I have had 2 bottles of water in the past 3 months" ?That morning and had a cup of coffee ?Symptoms started when he was standing at a table when he had a sudden onset of lightheadedness and felt very hot and sweaty like he was going to pass out. ?"Could not stand", could not step, felt it before, took over 20 min-30 to recover ?Diaphoresis after sitting down ?In the ER CXR 1.29, had not been drinking ?Not taking much lasix at  baseline ? ?Reports he has had prior similar episodes but typically these last 2 to 5 minutes and resolved without intervention  ?Typically only has episodes when standing ? ?Long smoking history ?Reports he is still driving but having trouble secondary to chronic vision issues ?" Almost need to give up and driving" ? ?CT chest: Reviewed ?Dilated aortic root, measuring up to 4.5 cm unchanged compared to ?prior ?Solid-appearing subcentimeter exophytic lesion of the lower pole ?of the right kidney, increased in size when compared with prior exam ?and suspicious for small RCC ? ?Echo 03/2022 reviewed ? 1. Left ventricular ejection fraction, by estimation, is 45 to 50%. The  ?left ventricle has mildly decreased function. The left ventricle  ?demonstrates regional wall motion abnormalities (septal wall dyskinesis  ?likely from condution abnormality). There is  ? mild left ventricular hypertrophy. The average left ventricular global  ?longitudinal strain is -7.8 %. The global longitudinal strain is abnormal.  ? 2. Right ventricular systolic function is normal. The right ventricular  ?size is normal.  ? 3. Left atrial size was severely dilated.  ? 4. The mitral valve has been repaired/replaced. No evidence of mitral  ?valve regurgitation. Mild mitral stenosis. The mean mitral valve gradient  ?is 5.0 mmHg.  ?  There is moderate dilatation of the aortic root, measuring 45 mm.  ?There is mild dilatation of the ascending aorta, measuring 43 mm. There is  ?borderline  dilatation of the aortic arch, measuring 39 mm.  ?Lost vision on right, chronic issue ?Vision on left comes and goes, no rhyme or reason ? ?EKG personally reviewed by myself on todays visit ?Paced rhythm with rate 81 bpm PVC ?Pacer downloads reviewed, no significant arrhythmia noted ? ?Other past medical history reviewed ?covid 19 early 2022 ?associated severe weakness, restlessness, and altered mental status. ? ?Previous fall in the shower at one point and struck  the left side of his upper chest associated with severe pain.  Evaluation in the ER ? ?CT chest 04/29/2021 ?4.5 cm ascending thoracic aortic aneurysm ? ?Poor appetite, lost 30 pounds ? ?Chronic vision issue ?No vision in right eye, ? ?Has  CRT-P and AV junction ablation ? ?Previously declined anticoagulation and cardioversion  ?He was started on amiodarone as a class IIb indication for rate control  ? amio has been discontinued because of nightmares.   ?  ?May 1791 acute systolic heart failure with symptoms of impending doom.  ? He was in atrial flutter with uncontrolled rate.   ? underwent CRT-P and AV junction ablation.   ? ?Failed Entresto in the past secondary to orthostasis ? ? ?Past Medical History:  ?Diagnosis Date  ? Anxiety   ? Arthritis   ? Ascending aortic aneurysm ([place])   ? a. 11/2018 Echo: Ao root 28m, Asc Ao 474m b. 02/2021 Echo: Ao root 4781mc. 04/2021 CT chest: Asc Ao 4.5cm.  ? Asthma   ? Bradycardia   ? CAD in native artery   ? a. LHC 09/2015: 40% pCx, 35% mRCA.  ? Chronic HFrEF (heart failure with reduced ejection fraction) ([facility] ? COPD (chronic obstructive pulmonary disease) [place] ? Dilation of intestine 01/2015  ? Fibromyalgia   ? Frequent headaches   ? GERD (gastroesophageal reflux disease)   ? History of blood clots   ? eye   ? History of hiatal hernia   ? History of kidney stones   ? History of rheumatic fever   ? Hypertension   ? NICM (nonischemic cardiomyopathy) ([facility] ? a. 07/2017 Echo: EF reduced to 40-45%; b. 07/2018 Echo: EF 25-30%; c. 11/2018 Echo: EF 30-35%; d. 02/2021 Echo: EF 45-50%, septal and apical septal HK. Mild MR. Sev dil LA. Mod dil RA. Ao root 59m38m? Permanent atrial fibrillation (HCC)[employer]? a. s/p TEE/DCCV 07/2015. b. H/o bleeding on Coumadin when INR >5, changed to Eliquis-subsequently discontinued; c. 04/2018 s/p SJM 3562 Quadra Allure MP DC PPM & AVN ablation.  ? S/P Minimally invasive maze operation for atrial fibrillation 10/22/2015  ? Complete bilateral atrial lesion  set using cryothermy and bipolar radiofrequency ablation with clipping of LA appendage via right mini thoracotomy approach  ? S/P minimally invasive mitral valve repair 10/22/2015  ? Complex valvuloplasty including triangular resection of posterior leaflet, artificial Gore-tex neochord placement x6 and 38 mm Sorin Memo 3D Rechord ring annuloplasty via right minithoracotomy approach  ? Severe mitral regurgitation s/p MVR   ? a. s/p MV repair 10/2015; b. 11/2018 Echo: Mild MS (mean grad 5mmH43m d. 02/2021 Echo: Mild MR. Mean grad 5mmHg85m? Sleep apnea   ? no longer uses cpap and doesn't use O2 at home  ? Stroke (HCC) [facility] no vision in right, on occasion sees a pinpoint   ? Thyroid disorder   ? TIA (transient ischemic attack)   ? Tobacco abuse   ? ?Past Surgical History:  ?Procedure Laterality Date  ?  ANKLE SURGERY    ? AV NODE ABLATION N/A 05/04/2018  ? Procedure: AV NODE ABLATION;  Surgeon: Thompson Grayer, MD;  Location: [facility];  Service: Cardiovascular;  Laterality: N/A;  ? BIV PACEMAKER INSERTION CRT-P N/A 05/03/2018  ? Procedure: BIV PACEMAKER INSERTION CRT-P;  Surgeon: Deboraha Sprang, MD;  Location: [facility];  Service: Cardiovascular;  Laterality: N/A;  ? CARDIAC CATHETERIZATION N/A 10/03/2015  ? Procedure: Right and Left Heart Cath and Coronary Angiography;  Surgeon: [name], MD;  Location: [facility];  Service: Cardiovascular;  Laterality: N/A;  ? COLONOSCOPY    ? ELECTROPHYSIOLOGIC STUDY N/A 08/18/2015  ? Procedure: CARDIOVERSION;  Surgeon: Wellington Hampshire, MD;  Location: [facility];  Service: Cardiovascular;  Laterality: N/A;  ? MASS EXCISION N/A 03/04/2021  ? Procedure: EXCISION INNER NASAL CANCER;  Surgeon: Rozetta Nunnery, MD;  Location: [facility];  Service: ENT;  Laterality: N/A;  ? MINIMALLY INVASIVE MAZE PROCEDURE N/A 10/22/2015  ? Procedure: MINIMALLY INVASIVE MAZE PROCEDURE;  Surgeon: Rexene Alberts, MD;  Location: [facility];  Service: Open Heart Surgery;  Laterality:  N/A;  ? MITRAL VALVE REPAIR Right 10/22/2015  ? Procedure: MINIMALLY INVASIVE MITRAL VALVE REPAIR (MVR);  Surgeon: Rexene Alberts, MD;  Location: [facility];  Service: Open Heart Surgery;  Laterality: Right;  ? SIN

## 2022-05-04 ENCOUNTER — Ambulatory Visit (INDEPENDENT_AMBULATORY_CARE_PROVIDER_SITE_OTHER): Payer: Medicare HMO | Admitting: Cardiovascular Disease

## 2022-05-04 ENCOUNTER — Encounter: Payer: Self-pay | Admitting: Cardiovascular Disease

## 2022-05-04 VITALS — BP 120/70 | HR 81 | Ht 73.0 in | Wt 178.0 lb

## 2022-05-04 DIAGNOSIS — Z950 Presence of cardiac pacemaker: Secondary | ICD-10-CM

## 2022-05-04 DIAGNOSIS — I428 Other cardiomyopathies: Secondary | ICD-10-CM

## 2022-05-04 DIAGNOSIS — Z9889 Other specified postprocedural states: Secondary | ICD-10-CM | POA: Diagnosis not present

## 2022-05-04 DIAGNOSIS — I7121 Aneurysm of the ascending aorta, without rupture: Secondary | ICD-10-CM

## 2022-05-04 DIAGNOSIS — I442 Atrioventricular block, complete: Secondary | ICD-10-CM | POA: Diagnosis not present

## 2022-05-04 DIAGNOSIS — I272 Pulmonary hypertension, unspecified: Secondary | ICD-10-CM

## 2022-05-04 DIAGNOSIS — I5020 Unspecified systolic (congestive) heart failure: Secondary | ICD-10-CM

## 2022-05-04 DIAGNOSIS — I340 Nonrheumatic mitral (valve) insufficiency: Secondary | ICD-10-CM

## 2022-05-04 DIAGNOSIS — I4821 Permanent atrial fibrillation: Secondary | ICD-10-CM | POA: Diagnosis not present

## 2022-05-04 NOTE — Patient Instructions (Addendum)
Stay hydrated ? ?Medication Instructions:  ?No changes ? ?If you need a refill on your cardiac medications before your next appointment, please call your pharmacy.  ? ?Lab work: ?No new labs needed ? ?Testing/Procedures: ?No new testing needed ? ?Follow-Up: ?At [facility], you and your health needs are our priority.  As part of our continuing mission to provide you with exceptional heart care, we have created designated Provider Care Teams.  These Care Teams include your primary Cardiologist (physician) and Advanced Practice Providers (APPs -  Physician Assistants and Nurse Practitioners) who all work together to provide you with the care you need, when you need it. ? ?You will need a follow up appointment in 6 months ? ?Providers on your designated Care Team:   ?Murray Hodgkins, NP ?Christell Faith, PA-C ?Cadence Kathlen Mody, PA-C ? ?COVID-19 Vaccine Information can be found at: [email or web address] For questions related to vaccine distribution or appointments, please email [email or web address] or call [phone].  ? ?

## 2022-05-10 NOTE — Progress Notes (Signed)
Remote pacemaker transmission.   

## 2022-06-04 NOTE — Progress Notes (Signed)
No ICM remote transmission received for 05/31/2022 and next ICM transmission scheduled for 06/14/2022.   

## 2022-06-07 ENCOUNTER — Ambulatory Visit (INDEPENDENT_AMBULATORY_CARE_PROVIDER_SITE_OTHER): Payer: Medicare HMO

## 2022-06-07 DIAGNOSIS — I5020 Unspecified systolic (congestive) heart failure: Secondary | ICD-10-CM | POA: Diagnosis not present

## 2022-06-07 DIAGNOSIS — Z950 Presence of cardiac pacemaker: Secondary | ICD-10-CM | POA: Diagnosis not present

## 2022-06-07 NOTE — Progress Notes (Unsigned)
EPIC Encounter for ICM Monitoring  Patient Name: Michael Delacruz is a 81 y.o. male Date: 06/07/2022 Primary Care Physican: [name], DO Primary Cardiologist: [name] Electrophysiologist: Vergie Living Pacing: 94%            02/03/2022 Weight: 172-173 lbs                                                            Spoke with wife per DPR and heart failure questions reviewed.  Pt was sleeping.   Transmission reviewed.   CorVue thoracic impedance suggesting normal fluid levels.     Prescribed: Furosemide 40 mg Take 1 tablet (40 mg total) by mouth daily as needed (As needed for weight gain or shortness of breath).   Recommendations:  No changes and encouraged to pt call if experiencing any fluid symptoms.   Follow-up plan: ICM clinic phone appointment on 07/19/2022.   91 day device clinic remote transmission 07/27/2022.     EP/Cardiology Office Visits:  Next 6 month follow with Dr. [name] will be Nov 2023 (no recall).   09/28/2022 with Dr Caryl Comes.    Copy of ICM check sent to Dr. Caryl Comes.  3 month ICM trend: 06/07/2022.    12-14 Month ICM trend:     [name], RN 06/07/2022 9:41 AM

## 2022-07-13 DIAGNOSIS — H1031 Unspecified acute conjunctivitis, right eye: Secondary | ICD-10-CM | POA: Diagnosis not present

## 2022-07-19 ENCOUNTER — Ambulatory Visit (INDEPENDENT_AMBULATORY_CARE_PROVIDER_SITE_OTHER): Payer: Medicare HMO

## 2022-07-19 DIAGNOSIS — I5020 Unspecified systolic (congestive) heart failure: Secondary | ICD-10-CM

## 2022-07-19 DIAGNOSIS — Z950 Presence of cardiac pacemaker: Secondary | ICD-10-CM

## 2022-07-21 ENCOUNTER — Telehealth: Payer: Self-pay

## 2022-07-21 NOTE — Progress Notes (Signed)
EPIC Encounter for ICM Monitoring  Patient Name: Michael Delacruz is a 81 y.o. male Date: 07/21/2022 Primary Care Physican: [name], DO Primary Cardiologist: [name] Electrophysiologist: Vergie Living Pacing: 94%            05/04/2022 Office Weight: 178 lbs                                                            Attempted call to patient and unable to reach.  Left detailed message per DPR regarding transmission. Transmission reviewed.    CorVue thoracic impedance suggesting normal fluid levels.     Prescribed: Furosemide 40 mg Take 1 tablet (40 mg total) by mouth daily as needed (As needed for weight gain or shortness of breath).   Recommendations:  Left voice mail with ICM number and encouraged to call if experiencing any fluid symptoms.   Follow-up plan: ICM clinic phone appointment on 08/24/2022.   91 day device clinic remote transmission 07/27/2022.     EP/Cardiology Office Visits:  11/08/2022 with Dr. [name].   09/28/2022 with Dr Caryl Comes.    Copy of ICM check sent to Dr. Caryl Comes.  3 month ICM trend: 07/19/2022.    12-14 Month ICM trend:     [name], RN 07/21/2022 1:58 PM

## 2022-07-21 NOTE — Telephone Encounter (Signed)
Remote ICM transmission received.  Attempted call to patient regarding ICM remote transmission and left detailed message per DPR.  Advised to return call for any fluid symptoms or questions. Next ICM remote transmission scheduled 08/24/2022.    

## 2022-07-27 ENCOUNTER — Ambulatory Visit (INDEPENDENT_AMBULATORY_CARE_PROVIDER_SITE_OTHER): Payer: Medicare HMO

## 2022-07-27 DIAGNOSIS — I442 Atrioventricular block, complete: Secondary | ICD-10-CM | POA: Diagnosis not present

## 2022-07-28 LAB — CUP PACEART REMOTE DEVICE CHECK
Battery Remaining Longevity: 48 mo
Battery Remaining Percentage: 56 %
Battery Voltage: 2.98 V
Date Time Interrogation Session: 20230808160743
Implantable Lead Implant Date: 20190515
Implantable Lead Implant Date: 20190515
Implantable Lead Location: 753858
Implantable Lead Location: 753860
Implantable Lead Model: 5076
Implantable Pulse Generator Implant Date: 20190515
Lead Channel Impedance Value: 510 Ohm
Lead Channel Impedance Value: 550 Ohm
Lead Channel Pacing Threshold Amplitude: 0.5 V
Lead Channel Pacing Threshold Amplitude: 0.75 V
Lead Channel Pacing Threshold Pulse Width: 0.5 ms
Lead Channel Pacing Threshold Pulse Width: 0.5 ms
Lead Channel Sensing Intrinsic Amplitude: 8.3 mV
Lead Channel Setting Pacing Amplitude: 2 V
Lead Channel Setting Pacing Amplitude: 2.5 V
Lead Channel Setting Pacing Pulse Width: 0.5 ms
Lead Channel Setting Pacing Pulse Width: 0.5 ms
Lead Channel Setting Sensing Sensitivity: 2 mV
Pulse Gen Model: 3562
Pulse Gen Serial Number: 9427031

## 2022-08-03 ENCOUNTER — Ambulatory Visit: Payer: Self-pay | Admitting: *Deleted

## 2022-08-03 NOTE — Telephone Encounter (Signed)
  Chief Complaint: swelling left groin area and multiple issues Symptoms: "knot" noted in left groin area pain , redness, hard to touch. After picking up something heavy. Weakness in bilateral legs, poor balance has to hold something to walk. Reports dizzy spells at times none now. With dizzy spells blurred vision, break out in to sweat. Noted weight loss now 172 lbs. Stool "stringy" not formed.  Frequency: 1 week to 10 days  Pertinent Negatives: Patient denies chest pain no difficulty breathing no fever. No weakness on one side of body Disposition: '[x]'$ ED /'[]'$ Urgent Care (no appt availability in office) / '[]'$ Appointment(In office/virtual)/ '[]'$  [facility]/ '[]'$ Home Care/ '[x]'$ Refused Recommended Disposition /'[]'$ [facility]/ '[]'$  Follow-up with PCP Additional Notes:   Recommended ED now. Patient reports he has a previous engagement and can not go now. Requesting earliest OV . Please advise. Earliest OV Friday.     Reason for Disposition  [1] MODERATE-SEVERE pain in penis, scrotum, or testicle AND [2] lasts > 1 hour  Answer Assessment - Initial Assessment Questions 1. MECHANISM: "How did the injury happen?" (Injuries to the penis can occur during sexual intercourse or masturbation; the caller may not be willing to mention this)      Picked up something heavy  2. ONSET: "When did the injury happen?" (Minutes or hours ago)      1 week to 10 days ago  3. LOCATION: "What part of the genitals are injured?"      Left groin area  4. APPEARANCE of INJURY: "What does the injury look like?"      Redness swelling hard to touch left side  5. BLEEDING: "Is the injury still bleeding?" If Yes, ask: "Is it difficult to stop?"      No  6. SIZE: For cuts, bruises, or swelling, ask: "How large is it?" (e.g., inches or centimeters)      Na  7. PAIN: "Is it painful?" If Yes, ask: "How bad is the pain?"    (e.g., Scale 1-10; or mild, moderate, severe)     Yes left groin  8. TETANUS: For any breaks  in the skin, ask: "When was the last tetanus booster?"     na 9. OTHER SYMPTOMS: "Do you have any other symptoms?" (e.g., blood from tip of penis, bloody urine)    Leg weakness, cough green , "knot" noted left inner groin area hard to touch painful, red, dizzy spells reported and blurred vision, breaks out into sweat during dizzy spells. No dizziness now. Denies chest pain no difficulty breathing. Stool "stringy" not formed. Noted weight loss  Protocols used: Genital Injury - Male-A-AH

## 2022-08-03 NOTE — Telephone Encounter (Signed)
Cannot work in sooner. I agree with disposition UC, and ED if severe.  Can schedule next available apt, or he can go seek care sooner.  Some of these issues are longer term chronic problems. I am not sure how many we can address all in one day if he is worked in for an acute problem with the groin.  Nobie Putnam, [facility] 08/03/2022, 5:17 PM

## 2022-08-04 NOTE — Telephone Encounter (Signed)
Called and left a message in regards to Dr. Marthann Schiller response. If he calls back, [facility] can go over that last message from Dr. Raliegh Ip.

## 2022-08-09 ENCOUNTER — Encounter: Payer: Self-pay | Admitting: Family Medicine

## 2022-08-09 ENCOUNTER — Ambulatory Visit (INDEPENDENT_AMBULATORY_CARE_PROVIDER_SITE_OTHER): Payer: Medicare HMO | Admitting: Family Medicine

## 2022-08-09 ENCOUNTER — Ambulatory Visit
Admission: RE | Admit: 2022-08-09 | Discharge: 2022-08-09 | Disposition: A | Discharge status: Home / Self Care | Payer: Medicare HMO | Source: Ambulatory Visit | Attending: Family Medicine | Admitting: Family Medicine

## 2022-08-09 VITALS — BP 120/78 | HR 70 | Ht 73.0 in | Wt 178.2 lb

## 2022-08-09 DIAGNOSIS — R519 Headache, unspecified: Secondary | ICD-10-CM

## 2022-08-09 DIAGNOSIS — Z8673 Personal history of transient ischemic attack (TIA), and cerebral infarction without residual deficits: Secondary | ICD-10-CM

## 2022-08-09 DIAGNOSIS — L02214 Cutaneous abscess of groin: Secondary | ICD-10-CM | POA: Diagnosis not present

## 2022-08-09 DIAGNOSIS — H814 Vertigo of central origin: Secondary | ICD-10-CM | POA: Insufficient documentation

## 2022-08-09 DIAGNOSIS — J0110 Acute frontal sinusitis, unspecified: Secondary | ICD-10-CM

## 2022-08-09 DIAGNOSIS — R42 Dizziness and giddiness: Secondary | ICD-10-CM

## 2022-08-09 MED ORDER — AMOXICILLIN-POT CLAVULANATE 875-125 MG PO TABS: 1.0000 | ORAL_TABLET | Freq: Two times a day (BID) | ORAL | 0 refills | Status: DC

## 2022-08-09 MED ORDER — MECLIZINE HCL 25 MG PO TABS: 25.0000 mg | ORAL_TABLET | Freq: Three times a day (TID) | ORAL | 0 refills | Status: DC | PRN

## 2022-08-09 NOTE — Progress Notes (Addendum)
Subjective:    Patient ID: Michael Delacruz, male    DOB: 1941-07-13, 81 y.o.   MRN: [id number]  Michael Delacruz is a 81 y.o. male presenting on 08/09/2022 for Mass, Headache, and Facial Pain   HPI  Verito Episodes / Dizziness / Recurrent Headache History of past Stroke  Dizziness, near passing out. Has been to ED before 04/2022, and has had heart evaluation. Also admits R side of his face continues to feel heavy, not really numb but not normal. No slurred speech or other muscle focal weakness.  Additionally Sinus congestion Reports green mucus drainage Worse with sinus pressure headache and throat bothering him  Swollen Knot in groin with drainage In groin lymph node 10 days ago, noticed this and has drained since, some redness around.  Previous sinus surgery Dr Radene Journey. He has retired and will need a new referral in future if returning to ENT  Additional finger pain, now R index, previously Left sided  Legs still feel weak and has episodes or spells that feel  Legs feel numb without pain. Especially calves. Difficulty with walking in morning. Worse with dizzy spells He will feel drenched from sweat  Limited eye sight R   Past Surgical History:  Procedure Laterality Date  . ANKLE SURGERY    . AV NODE ABLATION N/A 05/04/2018   Procedure: AV NODE ABLATION;  Surgeon: Thompson Grayer, MD;  Location: [place] [facility];  Service: Cardiovascular;  Laterality: N/A;  . BIV PACEMAKER INSERTION CRT-P N/A 05/03/2018   Procedure: BIV PACEMAKER INSERTION CRT-P;  Surgeon: Deboraha Sprang, MD;  Location: [place] [facility];  Service: Cardiovascular;  Laterality: N/A;  . CARDIAC CATHETERIZATION N/A 10/03/2015   Procedure: Right and Left Heart Cath and Coronary Angiography;  Surgeon: [name], MD;  Location: [place] [facility];  Service: Cardiovascular;  Laterality: N/A;  . COLONOSCOPY    . ELECTROPHYSIOLOGIC STUDY N/A 08/18/2015   Procedure: CARDIOVERSION;  Surgeon:  Wellington Hampshire, MD;  Location: [facility];  Service: Cardiovascular;  Laterality: N/A;  . MASS EXCISION N/A 03/04/2021   Procedure: EXCISION INNER NASAL CANCER;  Surgeon: Rozetta Nunnery, MD;  Location: [facility];  Service: ENT;  Laterality: N/A;  . MINIMALLY INVASIVE MAZE PROCEDURE N/A 10/22/2015   Procedure: MINIMALLY INVASIVE MAZE PROCEDURE;  Surgeon: Rexene Alberts, MD;  Location: [place];  Service: Open Heart Surgery;  Laterality: N/A;  . MITRAL VALVE REPAIR Right 10/22/2015   Procedure: MINIMALLY INVASIVE MITRAL VALVE REPAIR (MVR);  Surgeon: Rexene Alberts, MD;  Location: [place];  Service: Open Heart Surgery;  Laterality: Right;  . SINUS EXPLORATION    . SKIN FULL THICKNESS GRAFT N/A 03/04/2021   Procedure: SKIN GRAFT FULL THICKNESS;  Surgeon: Rozetta Nunnery, MD;  Location: [place];  Service: ENT;  Laterality: N/A;  . TEE WITHOUT CARDIOVERSION N/A 08/18/2015   Procedure: TRANSESOPHAGEAL ECHOCARDIOGRAM (TEE);  Surgeon: Wellington Hampshire, MD;  Location: [facility];  Service: Cardiovascular;  Laterality: N/A;  . TEE WITHOUT CARDIOVERSION N/A 10/22/2015   Procedure: TRANSESOPHAGEAL ECHOCARDIOGRAM (TEE);  Surgeon: Rexene Alberts, MD;  Location: [place];  Service: Open Heart Surgery;  Laterality: N/A;        08/09/2022   10:14 AM 03/24/2022    1:13 PM 02/24/2022    9:12 AM  Depression screen PHQ 2/9  Decreased Interest 2 2 0  Down, Depressed, Hopeless 2 2 0  PHQ - 2 Score 4 4 0  Altered sleeping 1  1 0  Tired, decreased energy '3 3 3  '$ Change in appetite 0 0 0  Feeling bad or failure about yourself  0 0 0  Trouble concentrating '1 1 2  '$ Moving slowly or fidgety/restless 0 0 0  Suicidal thoughts 0 0 0  PHQ-9 Score '9 9 5  '$ Difficult doing work/chores Somewhat difficult Somewhat difficult Not difficult at all    Social History   Tobacco Use  . Smoking status: Every Day    Packs/day: 0.50    Years: 60.00    Total pack years: 30.00    Types: Cigarettes    Start date: 21  . Smokeless  tobacco: Never  . Tobacco comments:    Resumed smoking, after quit 01/2018  Vaping Use  . Vaping Use: Never used  Substance Use Topics  . Alcohol use: Not Currently    Alcohol/week: 1.0 standard drink of alcohol    Types: 1 Standard drinks or equivalent per week    Comment: occasionally drinks a margarita  . Drug use: No    Review of Systems Per HPI unless specifically indicated above     Objective:    BP 120/78   Pulse 70   Ht '6\' 1"'$  (1.854 m)   Wt 178 lb 3.2 oz (80.8 kg)   SpO2 99%   BMI 23.51 kg/m   Wt Readings from Last 3 Encounters:  08/09/22 178 lb 3.2 oz (80.8 kg)  05/04/22 178 lb (80.7 kg)  04/25/22 172 lb (78 kg)    Physical Exam Vitals and nursing note reviewed.  Constitutional:      General: He is not in acute distress.    Appearance: He is well-developed. He is not diaphoretic.     Comments: Well-appearing, comfortable, cooperative  HENT:     Head: Normocephalic and atraumatic.  Eyes:     General:        Right eye: No discharge.        Left eye: No discharge.     Conjunctiva/sclera: Conjunctivae normal.  Neck:     Thyroid: No thyromegaly.  Cardiovascular:     Rate and Rhythm: Normal rate and regular rhythm.     Pulses: Normal pulses.     Heart sounds: Normal heart sounds. No murmur heard. Pulmonary:     Effort: Pulmonary effort is normal. No respiratory distress.     Breath sounds: Normal breath sounds. No wheezing or rales.  Musculoskeletal:        General: Normal range of motion.     Cervical back: Normal range of motion and neck supple.  Lymphadenopathy:     Cervical: No cervical adenopathy.  Skin:    General: Skin is warm and dry.     Findings: No erythema or rash.     Comments: Lesion L groin abscess  Neurological:     Mental Status: He is alert and oriented to person, place, and time. Mental status is at baseline.     Cranial Nerves: No cranial nerve deficit or facial asymmetry.     Sensory: Sensory deficit (R side of face some reduced  sensation to light touch.) present.     Motor: Weakness (bilateral lower extremity with stable baseline weakness 4 out of 5) present.     Comments: Cranial nerves intact, able to more symmetrical facial muscles eye brows, smile  Psychiatric:        Behavior: Behavior normal.     Comments: Well groomed, good eye contact, normal speech and thoughts  Results for orders placed or performed in visit on 07/27/22  CUP PACEART REMOTE DEVICE CHECK  Result Value Ref Range   Date Time Interrogation Session [id number]    Pulse Generator Manufacturer [id number]    Pulse Gen Model 3562 Quadra Allure MP    Pulse Gen Serial Number [id number]    Clinic Name [facility]    Implantable Pulse Generator Type Cardiac Resynch Therapy Pacemaker    Implantable Pulse Generator Implant Date [id number]    Implantable Lead Manufacturer [facility]    Implantable Lead Model 5076 CapSureFix Novus    Implantable Lead Serial Number [id number]    Implantable Lead Implant Date [id number]    Implantable Lead Location Detail 1 UNKNOWN    Implantable Lead Location [id number]    Implantable Lead Manufacturer [facility]    Implantable Lead Model 1458Q Quartet    Implantable Lead Serial Number [id number]    Implantable Lead Implant Date [id number]    Implantable Lead Location Detail 1 UNKNOWN    Implantable Lead Location [id number]    Lead Channel Setting Sensing Sensitivity 2.0 mV   Lead Channel Setting Sensing Adaptation Mode Fixed Pacing    Lead Channel Setting Pacing Pulse Width 0.5 ms   Lead Channel Setting Pacing Amplitude 2.5 V   Lead Channel Setting Pacing Pulse Width 0.5 ms   Lead Channel Setting Pacing Amplitude 2.0 V   Lead Channel Setting Pacing Capture Mode Fixed Pacing    Lead Channel Status NULL    Lead Channel Impedance Value 550 ohm   Lead Channel Pacing Threshold Amplitude 0.5 V   Lead Channel Pacing Threshold Pulse Width 0.5 ms   Lead Channel Status NULL    Lead Channel Impedance Value 510 ohm   Lead Channel Sensing  Intrinsic Amplitude 8.3 mV   Lead Channel Pacing Threshold Amplitude 0.75 V   Lead Channel Pacing Threshold Pulse Width 0.5 ms   Battery Status MOS    Battery Remaining Longevity 48 mo   Battery Remaining Percentage 56.0 %   Battery Voltage 2.98 V    I have personally reviewed the radiology report from 08/09/22 on Head CT.  CLINICAL DATA:  Headache, sudden, severe Vertigo, central Dizziness, non-specific headache, dizziness, vertigo, history of stroke   EXAM: CT HEAD WITHOUT CONTRAST   TECHNIQUE: Contiguous axial images were obtained from the base of the skull through the vertex without intravenous contrast.   RADIATION DOSE REDUCTION: This exam was performed according to the departmental dose-optimization program which includes automated exposure control, adjustment of the mA and/or kV according to patient size and/or use of iterative reconstruction technique.   COMPARISON:  CT head January 30, 2021.   FINDINGS: Brain: No evidence of acute infarction, hemorrhage, hydrocephalus, extra-axial collection or mass lesion/mass effect. Patchy white matter hypodensities, nonspecific but compatible with microvascular ischemic disease.   Vascular: Calcific intracranial atherosclerosis. No hyperdense vessel identified.   Skull: No acute fracture.   Sinuses/Orbits: Mild paranasal sinus mucosal thickening. No acute orbital findings.   Other: No mastoid effusions.   IMPRESSION: No evidence of acute intracranial abnormality.     Electronically Signed   By: Margaretha Sheffield M.D.   On: 08/09/2022 11:41      Assessment & Plan:   Problem List Items Addressed This Visit     History of stroke   Relevant Orders   CT HEAD WO CONTRAST (5MM) (Completed)   Ambulatory referral to Neurology   Other Visit Diagnoses     Vertigo    -  Primary  Relevant Medications   meclizine (ANTIVERT) 25 MG tablet   Other Relevant Orders   CT HEAD WO CONTRAST (5MM) (Completed)   Ambulatory  referral to Neurology   Acute nonintractable headache, unspecified headache type       Relevant Orders   CT HEAD WO CONTRAST (5MM) (Completed)   Ambulatory referral to Neurology   Vertigo of central origin       Relevant Orders   CT HEAD WO CONTRAST (5MM) (Completed)   Ambulatory referral to Neurology   Acute non-recurrent frontal sinusitis       Relevant Medications   amoxicillin-clavulanate (AUGMENTIN) 875-125 MG tablet   Abscess, groin       Relevant Medications   amoxicillin-clavulanate (AUGMENTIN) 875-125 MG tablet       Vertigo Headache History of Stroke  Discussion today given the persistence and severity of his symptoms, will order STAT Head CT to rule out CVA possibility and evaluate vertigo. Last imaging 2022.  He has some numbness on R side of face, but difficult to tell timeline onset of this and it is not appearing acute. He has complex history of sinus surgery and sinusitis symptoms  His episodic waves of vertigo are more characteristic of BPPV may be inner ear / sinus related.  Will pursue STAT Head CT  Home Vertigo exercises Epley Maneuver Rx meclizine  Treat sinuses with Augmentin  Pending Head CT result, will pursue Neurology referral next.  He can return to ENT or may need new ENT as well.  Return criteria to ED if need or sooner if worsening symptoms or other focal neuro deficit  Abscess groin Already draining it appears. Treat with Augmentin oral antibiotic Return criteria  Update * STAT Head CT 08/09/22, reviewed result today at 1pm* I have attempted to notify patient of results, he did not answer phone, I sent it to his mychart for review and asked our Clinical Staff to re-attempt phone call. Results are reassuring negative Head CT. No sign of stroke. He can continue current treatment plan and I will submit new referral to neurology now  Orders Placed This Encounter  Procedures  . CT HEAD WO CONTRAST (5MM)    Standing Status:   Future     Number of Occurrences:   1    Standing Expiration Date:   08/10/2023    Order Specific Question:   Preferred imaging location?    Answer:   [facility]  . Ambulatory referral to Neurology    Referral Priority:   Routine    Referral Type:   Consultation    Referral Reason:   Specialty Services Required    Requested Specialty:   Neurology    Number of Visits Requested:   1     Meds ordered this encounter  Medications  . amoxicillin-clavulanate (AUGMENTIN) 875-125 MG tablet    Sig: Take 1 tablet by mouth 2 (two) times daily.    Dispense:  20 tablet    Refill:  0  . meclizine (ANTIVERT) 25 MG tablet    Sig: Take 1 tablet (25 mg total) by mouth 3 (three) times daily as needed for dizziness.    Dispense:  30 tablet    Refill:  0      Follow up plan: Return if symptoms worsen or fail to improve.   Nobie Putnam, [facility] 08/09/2022, 9:50 AM

## 2022-08-09 NOTE — Addendum Note (Signed)
Addended by: [name] on: 08/09/2022 01:05 PM   Modules accepted: Orders

## 2022-08-09 NOTE — Progress Notes (Signed)
Results given to the patient. He will f/u as needed and wait for [facility] to contact him with an appt.

## 2022-08-09 NOTE — Patient Instructions (Addendum)
Thank you for coming to the office today.  Head CT Scan today STAT  If needed will refer to Neurology.  [facility] - Neurology Dept [place], [place] [address] Phone: [phone]  -----------------------------------------  You have symptoms of Vertigo (Benign Paroxysmal Positional Vertigo) - This is commonly caused by inner ear fluid imbalance, sometimes can be worsened by allergies and sinus symptoms, otherwise it can occur randomly sometimes and we may never discover the exact cause. - To treat this, try the Epley Manuever (see diagrams/instructions below) at home up to 3 times a day for 1-2 weeks or until symptoms resolve - You may take Meclizine as needed up to 3 times a day for dizziness, this will not cure symptoms but may help. Caution may make you drowsy.  If you develop significant worsening episode with vertigo that does not improve and you get severe headache, loss of vision, arm or leg weakness, slurred speech, or other concerning symptoms please seek immediate medical attention at Emergency Department.  Please schedule a follow-up appointment with Dr Parks Ranger within 4 weeks if Vertigo not improving, and will consider Referral to Vestibular Rehab  See the next page for images describing the Epley Manuever.     ----------------------------------------------------------------------------------------------------------------------         Please schedule a Follow-up Appointment to: Return if symptoms worsen or fail to improve.  If you have any other questions or concerns, please feel free to call the office or send a message through [place]. You may also schedule an earlier appointment if necessary.  Additionally, you may be receiving a survey about your experience at our office within a few days to 1 week by e-mail or mail. We value your feedback.  Nobie Putnam, DO [place]

## 2022-08-24 ENCOUNTER — Ambulatory Visit (INDEPENDENT_AMBULATORY_CARE_PROVIDER_SITE_OTHER): Payer: Medicare HMO

## 2022-08-24 DIAGNOSIS — I5020 Unspecified systolic (congestive) heart failure: Secondary | ICD-10-CM

## 2022-08-24 DIAGNOSIS — Z950 Presence of cardiac pacemaker: Secondary | ICD-10-CM

## 2022-08-25 NOTE — Progress Notes (Signed)
EPIC Encounter for ICM Monitoring  Patient Name: Michael Delacruz is a 81 y.o. male Date: 08/25/2022 Primary Care Physican: [name], DO Primary Cardiologist: [name] Electrophysiologist: Vergie Living Pacing: 93%            05/04/2022 Office Weight: 178 lbs 08/25/2022 Weight: 172 lbs                                                            Spoke with patient and heart failure questions reviewed.  Pt asymptomatic for fluid accumulation.  Reports feeling well at this time and voices no complaints.    CorVue thoracic impedance suggesting normal fluid levels.     Prescribed: Furosemide 40 mg Take 1 tablet (40 mg total) by mouth daily as needed (As needed for weight gain or shortness of breath).   Recommendations:  No changes and encouraged to call if experiencing any fluid symptoms.   Follow-up plan: ICM clinic phone appointment on 10/04/2022.   91 day device clinic remote transmission 10/26/2022.     EP/Cardiology Office Visits:  11/08/2022 with Dr. [name].   09/28/2022 with Dr Caryl Comes.    Copy of ICM check sent to Dr. Caryl Comes.   3 month ICM trend: 08/24/2022.    12-14 Month ICM trend:     [name], RN 08/25/2022 4:37 PM

## 2022-08-31 NOTE — Progress Notes (Signed)
Remote pacemaker transmission.   

## 2022-09-28 ENCOUNTER — Encounter: Payer: Self-pay | Admitting: Internal Medicine

## 2022-09-28 ENCOUNTER — Ambulatory Visit: Payer: Medicare HMO | Attending: Internal Medicine | Admitting: Internal Medicine

## 2022-09-28 VITALS — BP 108/70 | HR 80 | Ht 73.0 in | Wt 175.0 lb

## 2022-09-28 DIAGNOSIS — I4821 Permanent atrial fibrillation: Secondary | ICD-10-CM

## 2022-09-28 DIAGNOSIS — I428 Other cardiomyopathies: Secondary | ICD-10-CM

## 2022-09-28 DIAGNOSIS — I442 Atrioventricular block, complete: Secondary | ICD-10-CM

## 2022-09-28 DIAGNOSIS — I5020 Unspecified systolic (congestive) heart failure: Secondary | ICD-10-CM | POA: Diagnosis not present

## 2022-09-28 DIAGNOSIS — Z950 Presence of cardiac pacemaker: Secondary | ICD-10-CM | POA: Diagnosis not present

## 2022-09-28 NOTE — Patient Instructions (Signed)
Medication Instructions:  - Your physician recommends that you continue on your current medications as directed. Please refer to the Current Medication list given to you today.  *If you need a refill on your cardiac medications before your next appointment, please call your pharmacy*   Lab Work: - none ordered  If you have labs (blood work) drawn today and your tests are completely normal, you will receive your results only by: MyChart Message (if you have MyChart) OR A paper copy in the mail If you have any lab test that is abnormal or we need to change your treatment, we will call you to review the results.   Testing/Procedures: - none ordered   Follow-Up: At [facility], you and your health needs are our priority.  As part of our continuing mission to provide you with exceptional heart care, we have created designated Provider Care Teams.  These Care Teams include your primary Cardiologist (physician) and Advanced Practice Providers (APPs -  Physician Assistants and Nurse Practitioners) who all work together to provide you with the care you need, when you need it.  We recommend signing up for the patient portal called "MyChart".  Sign up information is provided on this After Visit Summary.  MyChart is used to connect with patients for Virtual Visits (Telemedicine).  Patients are able to view lab/test results, encounter notes, upcoming appointments, etc.  Non-urgent messages can be sent to your provider as well.   To learn more about what you can do with MyChart, go to [email or web address].    Your next appointment:   1 year(s)  The format for your next appointment:   In Person  Provider:   Steven Klein, MD    Other Instructions N/a  Important Information About Sugar       

## 2022-09-28 NOTE — Progress Notes (Signed)
Patient Care Team: [name], DO as PCP - General (Family Medicine) [name], MD as PCP - Cardiology (Cardiology) [name], MD as Consulting Physician (Cardiology) Rebekah Chesterfield, LCSW as Social Worker (Licensed Clinical Social Worker)   HPI  Michael Delacruz is a 81 y.o. male seen in follow-up for persistent/permanent atrial fibrillation/flutter with a rapid ventricular response and interval deterioration of LV systolic function He remains disinclined for anticoagulation.  Atrial fibrillation is now permanent 5/19 he presented with acute systolic heart failure with symptoms of impending doom.  He was in atrial flutter with uncontrolled rate.  He was at that juncture willing and underwent CRT-P and AV junction ablation.  I did his CRT-P Dr. Greggory Brandy did his AV ablation   Intolerant of the chest dose secondary to orthostasis;  weakness with standing with some need to sit down.  Fatigue in legs with mild walking.  No chest pain or shortness of breath  DATE TEST EF  AoRoot    8/16    Echo  55-60 %  Severe MR 2/2 post leaflet prolapse  12/16    Echo  55-60 %  Mild Aortic root dilitation MR mild   8/18 Echo  45-50%     5/19 Echo  20-25%    8/19 Echo  25-30%    5/22 CTA  45 mm   4/23 Echo  45*-50% 45 mm LA 31 mm/ 56 ml/m2         DATE TEST   9/18 Holter      Mean 123 (94-135)  12/18 Holter Mean HR 112 (94-135)          Date Cr K TSH ALT Hgb   10/18 1.23 3.4 5.31(8/18)  12   10/18 1..44 3.9 9.9>>8.7    5/19 0.97 3.9        1.03    5/23 1.29 4.0   14.2     Past Medical History:  Diagnosis Date   Anxiety    Arthritis    Ascending aortic aneurysm ([place])    a. 11/2018 Echo: Ao root 77m, Asc Ao 436m b. 02/2021 Echo: Ao root 4744mc. 04/2021 CT chest: Asc Ao 4.5cm.   Asthma    Bradycardia    CAD in native artery    a. LHC 09/2015: 40% pCx, 35% mRCA.   Chronic HFrEF (heart failure with reduced ejection fraction) (HCC)    COPD (chronic  obstructive pulmonary disease) (HCC)    Dilation of intestine 01/2015   Fibromyalgia    Frequent headaches    GERD (gastroesophageal reflux disease)    History of blood clots    eye    History of hiatal hernia    History of kidney stones    History of rheumatic fever    Hypertension    NICM (nonischemic cardiomyopathy) ([facility]  a. 07/2017 Echo: EF reduced to 40-45%; b. 07/2018 Echo: EF 25-30%; c. 11/2018 Echo: EF 30-35%; d. 02/2021 Echo: EF 45-50%, septal and apical septal HK. Mild MR. Sev dil LA. Mod dil RA. Ao root 49m85m Permanent atrial fibrillation (HCC)[place] a. s/p TEE/DCCV 07/2015. b. H/o bleeding on Coumadin when INR >5, changed to Eliquis-subsequently discontinued; c. 04/2018 s/p SJM 3562 Quadra Allure MP DC PPM & AVN ablation.   S/P Minimally invasive maze operation for atrial fibrillation 10/22/2015   Complete bilateral atrial lesion set using cryothermy and bipolar radiofrequency ablation with clipping of LA appendage via  right mini thoracotomy approach   S/P minimally invasive mitral valve repair 10/22/2015   Complex valvuloplasty including triangular resection of posterior leaflet, artificial Gore-tex neochord placement x6 and 38 mm Sorin Memo 3D Rechord ring annuloplasty via right minithoracotomy approach   Severe mitral regurgitation s/p MVR    a. s/p MV repair 10/2015; b. 11/2018 Echo: Mild MS (mean grad 41mHg); d. 02/2021 Echo: Mild MR. Mean grad 533mg.   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, on occasion sees a pinpoint    Thyroid disorder    TIA (transient ischemic attack)    Tobacco abuse     Past Surgical History:  Procedure Laterality Date   ANKLE SURGERY     AV NODE ABLATION N/A 05/04/2018   Procedure: AV NODE ABLATION;  Surgeon: AlThompson GrayerMD;  [facility];  Service: Cardiovascular;  Laterality: N/A;   BIV PACEMAKER INSERTION CRT-P N/A 05/03/2018   Procedure: BIV PACEMAKER INSERTION  CRT-P;  Surgeon: KlDeboraha SprangMD;  [facility];  Service: Cardiovascular;  Laterality: N/A;   CARDIAC CATHETERIZATION N/A 10/03/2015   Procedure: Right and Left Heart Cath and Coronary Angiography;  Surgeon: TiMinna MerrittsMD;  [facility];  Service: Cardiovascular;  Laterality: N/A;   COLONOSCOPY     ELECTROPHYSIOLOGIC STUDY N/A 08/18/2015   Procedure: CARDIOVERSION;  Surgeon: MuWellington HampshireMD;  Location: [facility] ORS;  Service: Cardiovascular;  Laterality: N/A;   MASS EXCISION N/A 03/04/2021   Procedure: EXCISION INNER NASAL CANCER;  Surgeon: NeRozetta NunneryMD;  [facility];  Service: ENT;  Laterality: N/A;   MINIMALLY INVASIVE MAZE PROCEDURE N/A 10/22/2015   Procedure: MINIMALLY INVASIVE MAZE PROCEDURE;  Surgeon: ClRexene AlbertsMD;  [facility]: Open Heart Surgery;  Laterality: N/A;   MITRAL VALVE REPAIR Right 10/22/2015   Procedure: MINIMALLY INVASIVE MITRAL VALVE REPAIR (MVR);  Surgeon: ClRexene AlbertsMD;  [facility]: Open [facility] Surgery;  Laterality: Right;   SINUS EXPLORATION     SKIN FULL THICKNESS GRAFT N/A 03/04/2021   Procedure: SKIN GRAFT FULL THICKNESS;  Surgeon: NeRozetta NunneryMD;  [facility];  Service: ENT;  Laterality: N/A;   TEE WITHOUT CARDIOVERSION N/A 08/18/2015   Procedure: TRANSESOPHAGEAL ECHOCARDIOGRAM (TEE);  Surgeon: MuWellington HampshireMD;  Location: [facility] OR[facility];  Service: Cardiovascular;  Laterality: N/A;   TEE WITHOUT CARDIOVERSION N/A 10/22/2015   Procedure: TRANSESOPHAGEAL ECHOCARDIOGRAM (TEE);  Surgeon: ClRexene AlbertsMD;  [facility]: [facility] Surgery;  Laterality: N/A;    Current Outpatient Medications  Medication Sig Dispense Refill   amoxicillin-clavulanate (AUGMENTIN) 875-125 MG tablet Take 1 tablet by mouth 2 (two) times daily. 20 tablet 0   furosemide (LASIX) 40 MG tablet Take 1 tablet (40 mg total) by mouth daily as needed (As needed for weight gain  or shortness of breath). 90 tablet 3   ipratropium (ATROVENT) 0.06 % nasal spray Place 2 sprays into both nostrils 4 (four) times daily. As needed 15 mL 3   QUEtiapine (SEROQUEL) 25 MG tablet Take 1 tablet (25 mg total) by mouth at bedtime. 90 tablet 3   sertraline (ZOLOFT) 25 MG tablet TAKE 1 TABLET (25 MG TOTAL) BY MOUTH DAILY. (Patient not taking: Reported on 09/28/2022) 90 tablet 1   No current facility-administered medications for this  visit.    Allergies  Allergen Reactions   Codeine Nausea Only   Digoxin And Related     SOB, bad dreams   Macrodantin [Nitrofurantoin Macrocrystal] Rash   Morphine And Related Rash      Review of Systems negative except from HPI and PMH  Physical Exam BP 108/70   Pulse 80   Ht '6\' 1"'$  (1.854 m)   Wt 175 lb (79.4 kg)   SpO2 98%   BMI 23.09 kg/m  Well developed and well nourished in no acute distress HENT normal Neck supple with JVP-flat Clear Device pocket well healed; without hematoma or erythema.  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  Low blood pressure precludes guideline directed therapies.  Heart rate excursion blunted; activity marker on his device basically flat.  We reprogrammed his rate response from auto--auto -0.5 and changed the slope from 8--10.  Exercise hall walk around the office talking as a we went.  He felt much much better.  Heart rate was  117; we decrease his slope from 10--9

## 2022-10-04 ENCOUNTER — Ambulatory Visit (INDEPENDENT_AMBULATORY_CARE_PROVIDER_SITE_OTHER): Payer: Medicare HMO

## 2022-10-04 DIAGNOSIS — Z950 Presence of cardiac pacemaker: Secondary | ICD-10-CM | POA: Diagnosis not present

## 2022-10-04 DIAGNOSIS — I5020 Unspecified systolic (congestive) heart failure: Secondary | ICD-10-CM | POA: Diagnosis not present

## 2022-10-08 NOTE — Progress Notes (Signed)
EPIC Encounter for ICM Monitoring  Patient Name: Michael Delacruz is a 81 y.o. male Date: 10/08/2022 Primary Care Physican: [name], DO Primary Cardiologist: [name] Electrophysiologist: Vergie Living Pacing: 90%            05/04/2022 Office Weight: 178 lbs 08/25/2022 Weight: 172 lbs 10/08/2022 Weight: 170-172 lbs                                                            Spoke with patient and heart failure questions reviewed.  Pt asymptomatic for fluid accumulation.     CorVue thoracic impedance suggesting normal fluid levels.     Prescribed: Furosemide 40 mg Take 1 tablet (40 mg total) by mouth daily as needed (As needed for weight gain or shortness of breath).   Recommendations:  No changes and encouraged to call if experiencing any fluid symptoms.   Follow-up plan: ICM clinic phone appointment on 11/08/2022.   91 day device clinic remote transmission 10/26/2022.     EP/Cardiology Office Visits:  11/08/2022 with Dr. [name].   Recall 09/28/2023 with Dr Caryl Comes.    Copy of ICM check sent to Dr. Caryl Comes.   3 month ICM trend: 10/04/2022.    12-14 Month ICM trend:     [name], RN 10/08/2022 10:50 AM

## 2022-10-11 DIAGNOSIS — H5213 Myopia, bilateral: Secondary | ICD-10-CM | POA: Diagnosis not present

## 2022-10-13 DIAGNOSIS — H02822 Cysts of right lower eyelid: Secondary | ICD-10-CM | POA: Diagnosis not present

## 2022-10-25 ENCOUNTER — Encounter: Payer: Self-pay | Admitting: Internal Medicine

## 2022-10-25 ENCOUNTER — Ambulatory Visit (INDEPENDENT_AMBULATORY_CARE_PROVIDER_SITE_OTHER): Payer: Medicare HMO | Admitting: Internal Medicine

## 2022-10-25 VITALS — BP 124/72 | HR 84 | Temp 97.1°F | Wt 172.0 lb

## 2022-10-25 DIAGNOSIS — J0140 Acute pansinusitis, unspecified: Secondary | ICD-10-CM | POA: Diagnosis not present

## 2022-10-25 MED ORDER — HYDROCOD POLI-CHLORPHE POLI ER 10-8 MG/5ML PO SUER: 5.0000 mL | Freq: Two times a day (BID) | ORAL | 0 refills | Status: DC

## 2022-10-25 MED ORDER — AMOXICILLIN-POT CLAVULANATE 875-125 MG PO TABS: 1.0000 | ORAL_TABLET | Freq: Two times a day (BID) | ORAL | 0 refills | Status: DC

## 2022-10-25 NOTE — Patient Instructions (Signed)

## 2022-10-25 NOTE — Progress Notes (Signed)
HPI  Pt presents to the clinic today with c/o headache, sinus pressure, runny nose and cough. This started 1 week ago, but got worse yesterday. He is blowing green mucous out of his nose. The cough is productive of green mucous. He has been feeling short of breath. He denies nasal congestion, ear pain, sore throat, chest pain, nausea, vomiting or diarrhea. He denies fever, chills or body aches. He has not tried anything OTC for this. He typically does not have allergies. He has had sick contacts with similar symptoms. He does have COPD, managed without inhalers. He does continue to smoke. He did not take a covid test.  Review of Systems     Past Medical History:  Diagnosis Date   Anxiety    Arthritis    Ascending aortic aneurysm ([place])    a. 11/2018 Echo: Ao root 46m, Asc Ao 46m b. 02/2021 Echo: Ao root 4771mc. 04/2021 CT chest: Asc Ao 4.5cm.   Asthma    Bradycardia    CAD in native artery    a. LHC 09/2015: 40% pCx, 35% mRCA.   Chronic HFrEF (heart failure with reduced ejection fraction) (HCC)    COPD (chronic obstructive pulmonary disease) (HCC)    Dilation of intestine 01/2015   Fibromyalgia    Frequent headaches    GERD (gastroesophageal reflux disease)    History of blood clots    eye    History of hiatal hernia    History of kidney stones    History of rheumatic fever    Hypertension    NICM (nonischemic cardiomyopathy) ([facility]  a. 07/2017 Echo: EF reduced to 40-45%; b. 07/2018 Echo: EF 25-30%; c. 11/2018 Echo: EF 30-35%; d. 02/2021 Echo: EF 45-50%, septal and apical septal HK. Mild MR. Sev dil LA. Mod dil RA. Ao root 52m64m Permanent atrial fibrillation (HCC)[place] a. s/p TEE/DCCV 07/2015. b. H/o bleeding on Coumadin when INR >5, changed to Eliquis-subsequently discontinued; c. 04/2018 s/p SJM 3562 Quadra Allure MP DC PPM & AVN ablation.   S/P Minimally invasive maze operation for atrial fibrillation 10/22/2015   Complete bilateral atrial lesion set using cryothermy and bipolar  radiofrequency ablation with clipping of LA appendage via right mini thoracotomy approach   S/P minimally invasive mitral valve repair 10/22/2015   Complex valvuloplasty including triangular resection of posterior leaflet, artificial Gore-tex neochord placement x6 and 38 mm Sorin Memo 3D Rechord ring annuloplasty via right minithoracotomy approach   Severe mitral regurgitation s/p MVR    a. s/p MV repair 10/2015; b. 11/2018 Echo: Mild MS (mean grad 5mmH50m d. 02/2021 Echo: Mild MR. Mean grad 5mmHg41m Sleep apnea    no longer uses cpap and doesn't use O2 at home   [facility] on [facility] sees [facility]    Thyroid disorder    TIA (transient ischemic attack)    Tobacco abuse     Family History  Problem Relation Age of Onset   Stroke Mother    Irregular heart beat Mother    Heart murmur Brother    Pulmonary embolism Brother    Hypertension Other    Pulmonary embolism Maternal Uncle     Social History   Socioeconomic History   Marital status: Married    Spouse name: [name]   Number of children: 3   Years of education: GED   Highest education level: GED or equivalent  Occupational History   Not  on file  Tobacco Use   Smoking status: Every Day    Packs/day: 0.50    Years: 60.00    Total pack years: 30.00    Types: Cigarettes    Start date: 75   Smokeless tobacco: Never   Tobacco comments:    Resumed smoking, after quit 01/2018  Vaping Use   Vaping Use: Never used  Substance and Sexual Activity   Alcohol use: Not Currently    Alcohol/week: 1.0 standard drink of alcohol    Types: 1 Standard drinks or equivalent per week    Comment: occasionally drinks a margarita   Drug use: No   Sexual activity: Not on file  Other Topics Concern   Not on file  Social History Narrative   Right handed    2-3 cups coffee per day         Social Determinants of Health   Financial Resource Strain: Low Risk  (01/01/2022)   Overall Financial Resource Strain  (CARDIA)    Difficulty of Paying Living Expenses: Not hard at all  Food Insecurity: No Food Insecurity (01/01/2022)   Hunger Vital Sign    Worried About Running Out of Food in the Last Year: Never true    Ran Out of Food in the Last Year: Never true  Transportation Needs: No Transportation Needs (01/01/2022)   PRAPARE - Hydrologist (Medical): No    Lack of Transportation (Non-Medical): No  Physical Activity: Inactive (01/01/2022)   Exercise Vital Sign    Days of Exercise per Week: 0 days    Minutes of Exercise per Session: 0 min  Stress: No Stress Concern Present (01/01/2022)   [place]    Feeling of Stress : Not at all  Social Connections: Moderately Isolated (01/01/2022)   Social Connection and Isolation Panel [NHANES]    Frequency of Communication with Friends and Family: More than three times a week    Frequency of Social Gatherings with Friends and Family: More than three times a week    Attends Religious Services: Never    Marine scientist or Organizations: No    Attends Archivist Meetings: Never    Marital Status: Married  Human resources officer Violence: Not At Risk (01/01/2022)   Humiliation, Afraid, Rape, and Kick questionnaire    Fear of Current or Ex-Partner: No    Emotionally Abused: No    Physically Abused: No    Sexually Abused: No    Allergies  Allergen Reactions   Codeine Nausea Only   Digoxin And Related     SOB, bad dreams   Macrodantin [Nitrofurantoin Macrocrystal] Rash   Morphine And Related Rash     Constitutional: Positive headache, fatigue. Denies abrupt weight changes.  HEENT:  Positive facial pain, runny nose. Denies eye redness, ear pain, ringing in the ears, wax buildup, runny nose or sore throat. Respiratory: Positive cough and shortness of breath. Denies difficulty breathing.  Cardiovascular: Denies chest pain, chest tightness, palpitations  or swelling in the hands or feet.   No other specific complaints in a complete review of systems (except as listed in HPI above).  Objective:   BP 124/72 (BP Location: Right Arm, Patient Position: Sitting, Cuff Size: Normal)   Pulse 84   Temp (!) 97.1 F (36.2 C) (Temporal)   Wt 172 lb (78 kg)   SpO2 99%   BMI 22.69 kg/m    General: Appears his stated age, well  developed, well nourished in NAD. HEENT: Head: normal shape and size, frontal and maxillary sinus tenderness noted; Eyes: sclera white, no icterus, conjunctiva pink; Nose: mucosa boggy and moist, septum midline; Throat/Mouth: + PND. Teeth present, mucosa erythematous and moist, no exudate noted, no lesions or ulcerations noted.  Neck:  No adenopathy noted.  Cardiovascular: Normal rate and rhythm. S1,S2 noted.  No murmur, rubs or gallops noted.  Pulmonary/Chest: Normal effort and positive vesicular breath sounds. No respiratory distress. No wheezes, rales or ronchi noted.       Assessment & Plan:   Acute Bacterial Sinusitis  Can use a Neti Pot which can be purchased from your local drug store. Flonase 2 sprays each nostril for 3 days and then as needed. Rx for Augmentin BID for 10 days Rx for Tussionex cough syrup  RTC as needed or if symptoms persist. Webb Silversmith, NP

## 2022-10-26 ENCOUNTER — Ambulatory Visit (INDEPENDENT_AMBULATORY_CARE_PROVIDER_SITE_OTHER): Payer: Medicare HMO

## 2022-10-26 DIAGNOSIS — Z950 Presence of cardiac pacemaker: Secondary | ICD-10-CM | POA: Diagnosis not present

## 2022-10-26 DIAGNOSIS — I442 Atrioventricular block, complete: Secondary | ICD-10-CM

## 2022-10-26 LAB — CUP PACEART REMOTE DEVICE CHECK
Battery Remaining Longevity: 47 mo
Battery Remaining Percentage: 53 %
Battery Voltage: 2.98 V
Date Time Interrogation Session: 20231107035132
Implantable Lead Connection Status: 753985
Implantable Lead Connection Status: 753985
Implantable Lead Implant Date: 20190515
Implantable Lead Implant Date: 20190515
Implantable Lead Location: 753858
Implantable Lead Location: 753860
Implantable Lead Model: 5076
Implantable Pulse Generator Implant Date: 20190515
Lead Channel Impedance Value: 550 Ohm
Lead Channel Impedance Value: 630 Ohm
Lead Channel Pacing Threshold Amplitude: 0.5 V
Lead Channel Pacing Threshold Amplitude: 0.75 V
Lead Channel Pacing Threshold Pulse Width: 0.5 ms
Lead Channel Pacing Threshold Pulse Width: 0.5 ms
Lead Channel Sensing Intrinsic Amplitude: 8.7 mV
Lead Channel Setting Pacing Amplitude: 2 V
Lead Channel Setting Pacing Amplitude: 2.5 V
Lead Channel Setting Pacing Pulse Width: 0.5 ms
Lead Channel Setting Pacing Pulse Width: 0.5 ms
Lead Channel Setting Sensing Sensitivity: 2 mV
Pulse Gen Model: 3562
Pulse Gen Serial Number: 9427031

## 2022-11-06 NOTE — Progress Notes (Unsigned)
Evaluation Performed:  Follow-up visit  Date:  11/08/2022   ID:  Michael Delacruz, DOB 12/03/1941, MRN [id number]  Patient Location:  [place] [address]   Provider location:   [facility] [place], [place] office  PCP:  [name], DO  Cardiologist:  Arvid Right [facility]  Chief Complaint  Patient presents with   6 month follow up     Patient c/o chest pain & shortness of breath with exertion. Medications reviewed by the patient verbally.     History of Present Illness:    Michael Delacruz is a 81 y.o. male  past medical history of long smoking history for 50 years with underlying COPD,  severe mitral valve regurgitation on echocardiogram,  prolapse of posterior leaflet,  moderate pulmonary hypertension  s/p successful MR repair by right thoracotomy by Dr. Roxy Manns 11/ 2016 Smokes 1 ppd PAF on warfarin had bleeding, now on asa, s/p Maze Surgical report indicates clipping of left atrial appendage, Atricure left atrial clip, size 45 mm Previous history of retinal bleeding felt exacerbated by warfarin , requiring surgery 2 residual right eye  vision deficits Atrial flutter EF 40 to 45% Dilated ascending aorta 4.5 cm on CT, unchaged 2023 Who presents for follow up of his MR repair and atrial flutter  LOV 5/23 In general reports that he feels well Chronic vision issues, last site in the right eye Glasses not working  Pacer working well, pacer downloads reviewed  Lasix as needed for SOB,  "Seldom"  Rare episodes of near syncope No certain time, typically when standing BP low on today's visit, has not been checking blood pressure at home  Sedentary, no regular walking or exercise program  EKG personally reviewed by myself on todays visit Paced rhythm rate 88 bpm, PVCs  Past medical history reviewed emergency room Apr 25, 2022 for near syncope, shortness of breath Was in antique store, reports he had been shopping for around 4 hours,  had been on his feet, had not had much to drink that day, " I have had 2 bottles of water in the past 3 months" That morning and had a cup of coffee Symptoms started when he was standing at a table when he had a sudden onset of lightheadedness and felt very hot and sweaty like he was going to pass out. "Could not stand", could not step, felt it before, took over 20 min-30 to recover Diaphoresis after sitting down In the ER CXR 1.29, had not been drinking Not taking much lasix at baseline  Long smoking history Reports he is still driving but having trouble secondary to chronic vision issues " Almost need to give up and driving"  CT chest: Dilated aortic root, measuring up to 4.5 cm unchanged compared to prior Solid-appearing subcentimeter exophytic lesion of the lower pole of the right kidney, increased in size when compared with prior exam and suspicious for small RCC  Echo 03/2022 reviewed  1. Left ventricular ejection fraction, by estimation, is 45 to 50%. The  left ventricle has mildly decreased function. The left ventricle  demonstrates regional wall motion abnormalities (septal wall dyskinesis  likely from condution abnormality). There is   mild left ventricular hypertrophy. The average left ventricular global  longitudinal strain is -7.8 %. The global longitudinal strain is abnormal.   2. Right ventricular systolic function is normal. The right ventricular  size is normal.   3. Left atrial size was severely dilated.  4. The mitral valve has been repaired/replaced. No evidence of mitral  valve regurgitation. Mild mitral stenosis. The mean mitral valve gradient  is 5.0 mmHg.    There is moderate dilatation of the aortic root, measuring 45 mm.  There is mild dilatation of the ascending aorta, measuring 43 mm. There is  borderline dilatation of the aortic arch, measuring 39 mm.  Lost vision on right, chronic issue Vision on left comes and goes, no rhyme or reason  covid 19  early 2022 associated severe weakness, restlessness, and altered mental status.  Previous fall in the shower at one point and struck the left side of his upper chest associated with severe pain.  Evaluation in the ER  CT chest 04/29/2021 4.5 cm ascending thoracic aortic aneurysm  Poor appetite, lost 30 pounds  Chronic vision issue No vision in right eye,  Has  CRT-P and AV junction ablation  Previously declined anticoagulation and cardioversion  He was started on amiodarone as a class IIb indication for rate control   amio has been discontinued because of nightmares.     May 5277 acute systolic heart failure with symptoms of impending doom.   He was in atrial flutter with uncontrolled rate.    underwent CRT-P and AV junction ablation.    Failed Entresto in the past secondary to orthostasis   Past Medical History:  Diagnosis Date   Anxiety    Arthritis    Ascending aortic aneurysm ([place])    a. 11/2018 Echo: Ao root 70m, Asc Ao 449m b. 02/2021 Echo: Ao root 4734mc. 04/2021 CT chest: Asc Ao 4.5cm.   Asthma    Bradycardia    CAD in native artery    a. LHC 09/2015: 40% pCx, 35% mRCA.   Chronic HFrEF (heart failure with reduced ejection fraction) (HCC)    COPD (chronic obstructive pulmonary disease) (HCC)    Dilation of intestine 01/2015   Fibromyalgia    Frequent headaches    GERD (gastroesophageal reflux disease)    History of blood clots    eye    History of hiatal hernia    History of kidney stones    History of rheumatic fever    Hypertension    NICM (nonischemic cardiomyopathy) ([place]  a. 07/2017 Echo: EF reduced to 40-45%; b. 07/2018 Echo: EF 25-30%; c. 11/2018 Echo: EF 30-35%; d. 02/2021 Echo: EF 45-50%, septal and apical septal HK. Mild MR. Sev dil LA. Mod dil RA. Ao root 5m66m Permanent atrial fibrillation (HCC)[place] a. s/p TEE/DCCV 07/2015. b. H/o bleeding on Coumadin when INR >5, changed to Eliquis-subsequently discontinued; c. 04/2018 s/p SJM 3562 Quadra Allure MP  DC PPM & AVN ablation.   S/P Minimally invasive maze operation for atrial fibrillation 10/22/2015   Complete bilateral atrial lesion set using cryothermy and bipolar radiofrequency ablation with clipping of LA appendage via right mini thoracotomy approach   S/P minimally invasive mitral valve repair 10/22/2015   Complex valvuloplasty including triangular resection of posterior leaflet, artificial Gore-tex neochord placement x6 and 38 mm Sorin Memo 3D Rechord ring annuloplasty via right minithoracotomy approach   Severe mitral regurgitation s/p MVR    a. s/p MV repair 10/2015; b. 11/2018 Echo: Mild MS (mean grad 5mmH43m d. 02/2021 Echo: Mild MR. Mean grad 5mmHg11m Sleep apnea    no longer uses cpap and doesn't use O2 at home   [facility], on occasion sees a  pinpoint    Thyroid disorder    TIA (transient ischemic attack)    Tobacco abuse    Past Surgical History:  Procedure Laterality Date   ANKLE SURGERY     AV NODE ABLATION N/A 05/04/2018   Procedure: AV NODE ABLATION;  Surgeon: Thompson Grayer, MD;  Location: [place] [facility];  Service: Cardiovascular;  Laterality: N/A;   BIV PACEMAKER INSERTION CRT-P N/A 05/03/2018   Procedure: BIV PACEMAKER INSERTION CRT-P;  Surgeon: Deboraha Sprang, MD;  Location: [place] [facility];  Service: Cardiovascular;  Laterality: N/A;   CARDIAC CATHETERIZATION N/A 10/03/2015   Procedure: Right and Left Heart Cath and Coronary Angiography;  Surgeon: [name], MD;  Location: [place] [facility];  Service: Cardiovascular;  Laterality: N/A;   COLONOSCOPY     ELECTROPHYSIOLOGIC STUDY N/A 08/18/2015   Procedure: CARDIOVERSION;  Surgeon: Wellington Hampshire, MD;  Location: [facility] ORS;  Service: Cardiovascular;  Laterality: N/A;   MASS EXCISION N/A 03/04/2021   Procedure: EXCISION INNER NASAL CANCER;  Surgeon: Rozetta Nunnery, MD;  Location[facility];  Service: ENT;  Laterality: N/A;   MINIMALLY INVASIVE MAZE PROCEDURE N/A 10/22/2015    Procedure: MINIMALLY INVASIVE MAZE PROCEDURE;  Surgeon: Rexene Alberts, MD;  Location: [place];  Service: Open Heart Surgery;  Laterality: N/A;   MITRAL VALVE REPAIR Right 10/22/2015   Procedure: MINIMALLY INVASIVE MITRAL VALVE REPAIR (MVR);  Surgeon: Rexene Alberts, MD;  Location: [place];  Service: Open Heart Surgery;  Laterality: Right;   SINUS EXPLORATION     SKIN FULL THICKNESS GRAFT N/A 03/04/2021   Procedure: SKIN GRAFT FULL THICKNESS;  Surgeon: Rozetta Nunnery, MD;  Location: [facility] OR;  Service: ENT;  Laterality: N/A;   TEE WITHOUT CARDIOVERSION N/A 08/18/2015   Procedure: TRANSESOPHAGEAL ECHOCARDIOGRAM (TEE);  Surgeon: Wellington Hampshire, MD;  Location: [facility] OR[facility];  Service: Cardiovascular;  Laterality: N/A;   TEE WITHOUT CARDIOVERSION N/A 10/22/2015   Procedure: TRANSESOPHAGEAL ECHOCARDIOGRAM (TEE);  Surgeon: Rexene Alberts, MD;  Location: [place];  Service: Open Heart Surgery;  Laterality: N/A;     Current Meds  Medication Sig   furosemide (LASIX) 40 MG tablet Take 1 tablet (40 mg total) by mouth daily as needed (As needed for weight gain or shortness of breath).   QUEtiapine (SEROQUEL) 25 MG tablet Take 1 tablet (25 mg total) by mouth at bedtime.     Allergies:   Codeine, Digoxin and related, Macrodantin [nitrofurantoin macrocrystal], and Morphine and related   Social History   Tobacco Use   Smoking status: Every Day    Packs/day: 0.50    Years: 60.00    Total pack years: 30.00    Types: Cigarettes    Start date: 77   Smokeless tobacco: Never   Tobacco comments:    Resumed smoking, after quit 01/2018  Vaping Use   Vaping Use: Never used  Substance Use Topics   Alcohol use: Not Currently    Alcohol/week: 1.0 standard drink of alcohol    Types: 1 Standard drinks or equivalent per week    Comment: occasionally drinks a margarita   Drug use: No     Family Hx: The patient's family history includes Heart murmur in his brother; Hypertension in an other family member;  Irregular heart beat in his mother; Pulmonary embolism in his brother and maternal uncle; Stroke in his mother.  ROS:   Please see the history of present illness.    Review of Systems  Constitutional: Negative.  HENT: Negative.    Eyes:        Vision deficits  Respiratory: Negative.    Cardiovascular: Negative.   Gastrointestinal: Negative.   Musculoskeletal: Negative.        Leg weakness  Neurological:  Positive for dizziness.  Psychiatric/Behavioral: Negative.    All other systems reviewed and are negative.    Labs/Other Tests and Data Reviewed:    Recent Labs: 04/25/2022: B Natriuretic Peptide 99.2; BUN 11; Creatinine, Ser 1.29; Hemoglobin 14.2; Magnesium 1.9; Platelets 116; Potassium 4.0; Sodium 142   Recent Lipid Panel Lab Results  Component Value Date/Time   CHOL 215 (H) 05/01/2018 10:23 AM   TRIG 79 05/01/2018 10:23 AM   HDL 42 05/01/2018 10:23 AM   CHOLHDL 5.1 05/01/2018 10:23 AM   LDLCALC 157 (H) 05/01/2018 10:23 AM    Wt Readings from Last 3 Encounters:  11/08/22 174 lb (78.9 kg)  10/25/22 172 lb (78 kg)  09/28/22 175 lb (79.4 kg)     Exam:    Vital Signs: Vital signs may also be detailed in the HPI BP 110/60 (BP Location: Left Arm, Patient Position: Sitting, Cuff Size: Normal)   Pulse 88   Ht '6\' 1"'$  (1.854 m)   Wt 174 lb (78.9 kg)   SpO2 98%   BMI 22.96 kg/m   Constitutional:  oriented to person, place, and time. No distress.  HENT:  Head: Grossly normal Eyes:  no discharge. No scleral icterus.  Neck: No JVD, no carotid bruits  Cardiovascular: Regular rate and rhythm, no murmurs appreciated Pulmonary/Chest: Clear to auscultation bilaterally, no wheezes or rails Abdominal: Soft.  no distension.  no tenderness.  Musculoskeletal: Normal range of motion Neurological:  normal muscle tone. Coordination normal. No atrophy Skin: Skin warm and dry Psychiatric: normal affect, pleasant  ASSESSMENT & PLAN:    Orthostasis Prior episodes of orthostasis   Recommend he try to avoid using his Lasix, stay hydrated Monitor blood pressure at home, check orthostatics at home Paced rhythm, no significant arrhythmia picked up on pacer download  Chronic atrial fibrillation Does not want anticoagultion, previously very concerned about bleeding in his eye Prior appendage clipping during surgery Prior AV node ablation with pacer,  Followed by EP  Complete heart block (HCC) paced rhythm, followed by EP  NICM (nonischemic cardiomyopathy) ([place]) EF 30 to 35% in 11/2018 up to 45 to 50% in 02/2021 and April 2023  stable  mitral valve repair Previously declined medication changes  Atrial flutter, unspecified type ([place]) Previous TIA/strokes Does not want anticoagulation  Dilated aorta aortic ascending aorta is 4.5 cm, on CT scan May 2022 Aorta unchanged on repeat imaging 2023    Total encounter time more than 30 minutes  Greater than 50% was spent in counseling and coordination of care with the patient    Signed, Michael Rogue, MD  11/08/2022 10:07 AM    [place] Office [address], [place], [place] [address]

## 2022-11-08 ENCOUNTER — Encounter: Payer: Self-pay | Admitting: Cardiovascular Disease

## 2022-11-08 ENCOUNTER — Ambulatory Visit: Payer: Medicare HMO | Attending: Cardiovascular Disease | Admitting: Cardiovascular Disease

## 2022-11-08 ENCOUNTER — Ambulatory Visit (INDEPENDENT_AMBULATORY_CARE_PROVIDER_SITE_OTHER): Payer: Medicare HMO

## 2022-11-08 VITALS — BP 110/60 | HR 88 | Ht 73.0 in | Wt 174.0 lb

## 2022-11-08 DIAGNOSIS — Z950 Presence of cardiac pacemaker: Secondary | ICD-10-CM | POA: Diagnosis not present

## 2022-11-08 DIAGNOSIS — I442 Atrioventricular block, complete: Secondary | ICD-10-CM

## 2022-11-08 DIAGNOSIS — I5020 Unspecified systolic (congestive) heart failure: Secondary | ICD-10-CM

## 2022-11-08 DIAGNOSIS — I272 Pulmonary hypertension, unspecified: Secondary | ICD-10-CM

## 2022-11-08 DIAGNOSIS — Z9889 Other specified postprocedural states: Secondary | ICD-10-CM

## 2022-11-08 DIAGNOSIS — I5022 Chronic systolic (congestive) heart failure: Secondary | ICD-10-CM

## 2022-11-08 DIAGNOSIS — I428 Other cardiomyopathies: Secondary | ICD-10-CM | POA: Diagnosis not present

## 2022-11-08 DIAGNOSIS — I340 Nonrheumatic mitral (valve) insufficiency: Secondary | ICD-10-CM | POA: Diagnosis not present

## 2022-11-08 DIAGNOSIS — I7121 Aneurysm of the ascending aorta, without rupture: Secondary | ICD-10-CM

## 2022-11-08 DIAGNOSIS — I4821 Permanent atrial fibrillation: Secondary | ICD-10-CM | POA: Diagnosis not present

## 2022-11-08 DIAGNOSIS — R0789 Other chest pain: Secondary | ICD-10-CM

## 2022-11-08 DIAGNOSIS — Z0100 Encounter for examination of eyes and vision without abnormal findings: Secondary | ICD-10-CM | POA: Diagnosis not present

## 2022-11-08 NOTE — Patient Instructions (Addendum)
[facility] #[phone]  Medication Instructions:  No changes  If you need a refill on your cardiac medications before your next appointment, please call your pharmacy.    Lab work: No new labs needed   Testing/Procedures: No new testing needed   Follow-Up: At [facility], you and your health needs are our priority.  As part of our continuing mission to provide you with exceptional heart care, we have created designated Provider Care Teams.  These Care Teams include your primary Cardiologist (physician) and Advanced Practice Providers (APPs -  Physician Assistants and Nurse Practitioners) who all work together to provide you with the care you need, when you need it.  You will need a follow up appointment in 12 months  Providers on your designated Care Team:   Murray Hodgkins, NP Christell Faith, PA-C Cadence Kathlen Mody, [place]  COVID-19 Vaccine Information can be found at: [email or web address] For questions related to vaccine distribution or appointments, please email [email or web address] or call [phone].

## 2022-11-08 NOTE — Progress Notes (Signed)
EPIC Encounter for ICM Monitoring  Patient Name: Michael Delacruz is a 81 y.o. male Date: 11/08/2022 Primary Care Physican: [name], DO Primary Cardiologist: [name] Electrophysiologist: Vergie Living Pacing: 90%            05/04/2022 Office Weight: 178 lbs 08/25/2022 Weight: 172 lbs 10/08/2022 Weight: 170-172 lbs                                                            Spoke with patient and heart failure questions reviewed.  Transmission results reviewed.  Pt asymptomatic for fluid accumulation.  Reports feeling well at this time and voices no complaints.  He ate at [employer] over the weekend.   CorVue thoracic impedance suggesting possible fluid accumulation starting 11/19.     Prescribed: Furosemide 40 mg Take 1 tablet (40 mg total) by mouth daily as needed (As needed for weight gain or shortness of breath).   Recommendations:  Recommendation to limit salt intake to 2000 mg daily and fluid intake to 64 oz daily.  Encouraged to call if experiencing any fluid symptoms.    Follow-up plan: ICM clinic phone appointment on 11/15/2022 to recheck fluid levels.   91 day device clinic remote transmission 01/25/2023.     EP/Cardiology Office Visits:  11/08/2022 with Dr. [name].   Recall 09/28/2023 with Dr Caryl Comes.    Copy of ICM check sent to Dr. Caryl Comes.    3 month ICM trend: 11/08/2022.    12-14 Month ICM trend:     [name], RN 11/08/2022 9:41 AM

## 2022-11-15 ENCOUNTER — Ambulatory Visit (INDEPENDENT_AMBULATORY_CARE_PROVIDER_SITE_OTHER): Payer: Medicare HMO

## 2022-11-15 DIAGNOSIS — Z950 Presence of cardiac pacemaker: Secondary | ICD-10-CM

## 2022-11-15 DIAGNOSIS — I5020 Unspecified systolic (congestive) heart failure: Secondary | ICD-10-CM

## 2022-11-15 NOTE — Progress Notes (Signed)
EPIC Encounter for ICM Monitoring  Patient Name: Michael Delacruz is a 81 y.o. male Date: 11/15/2022 Primary Care Physican: [name], DO Primary Cardiologist: [name] Electrophysiologist: Vergie Living Pacing: 9%            05/04/2022 Office Weight: 178 lbs 08/25/2022 Weight: 172 lbs 10/08/2022 Weight: 170-172 lbs 11/15/2022 Weight:                                                             Spoke with patient and heart failure questions reviewed.  Transmission results reviewed.  Pt asymptomatic for fluid accumulation.  Reports feeling very tired.  He was not able to take PRN Furosemide after previous ICM call.     Diet:  He does not limit salt and uses salt shaker at home.  Discussed the effects of high salt diet.   CorVue thoracic impedance returned to normal after 11/20 remote transmission but possible fluid accumulation recurred 11/26.     Prescribed: Furosemide 40 mg Take 1 tablet (40 mg total) by mouth daily as needed (As needed for weight gain or shortness of breath).   Recommendations:  He will take PRN Furosemide 1 tablet x 2 days then return to PRN. Recommendation to limit salt intake to 2000 mg daily.      Follow-up plan: ICM clinic phone appointment on 11/22/2022 to recheck fluid levels.   91 day device clinic remote transmission 01/25/2023.     EP/Cardiology Office Visits:  Recall 11/19/024 with Dr. [name].   Recall 09/28/2023 with Dr Caryl Comes.    Copy of ICM check sent to Dr. Caryl Comes.     3 month ICM trend: 11/15/2022.    12-14 Month ICM trend:     [name], RN 11/15/2022 12:27 PM

## 2022-11-22 ENCOUNTER — Ambulatory Visit (INDEPENDENT_AMBULATORY_CARE_PROVIDER_SITE_OTHER): Payer: Medicare HMO

## 2022-11-22 DIAGNOSIS — I5020 Unspecified systolic (congestive) heart failure: Secondary | ICD-10-CM

## 2022-11-22 DIAGNOSIS — Z950 Presence of cardiac pacemaker: Secondary | ICD-10-CM

## 2022-11-22 NOTE — Progress Notes (Signed)
Remote pacemaker transmission.   

## 2022-11-24 ENCOUNTER — Telehealth: Payer: Self-pay

## 2022-11-24 NOTE — Telephone Encounter (Signed)
Remote ICM transmission received.  Attempted call to patient regarding ICM remote transmission and left detailed message per DPR.  Advised to return call for any fluid symptoms or questions. Next ICM remote transmission scheduled 01/03/2023.

## 2022-11-24 NOTE — Progress Notes (Signed)
EPIC Encounter for ICM Monitoring  Patient Name: Michael Delacruz is a 81 y.o. male Date: 11/24/2022 Primary Care Physican: [name], DO Primary Cardiologist: [name] Electrophysiologist: Vergie Living Pacing: 92%            05/04/2022 Office Weight: 178 lbs 08/25/2022 Weight: 172 lbs 10/08/2022 Weight: 170-172 lbs                                                            Attempted call to patient and unable to reach.  Left detailed message per DPR regarding transmission. Transmission reviewed.     Diet:  Does not follow low salt diet and uses salt at home.     CorVue thoracic impedance suggesting fluid levels returned to normal.     Prescribed: Furosemide 40 mg Take 1 tablet (40 mg total) by mouth daily as needed (As needed for weight gain or shortness of breath).   Recommendations: Left voice mail with ICM number and encouraged to call if experiencing any fluid symptoms.   Follow-up plan: ICM clinic phone appointment on 01/03/2023.   91 day device clinic remote transmission 01/25/2023.     EP/Cardiology Office Visits:  Recall 11/19/024 with Dr. [name].   Recall 09/28/2023 with Dr Caryl Comes.    Copy of ICM check sent to Dr. Caryl Comes.      3 month ICM trend: 11/22/2022.    12-14 Month ICM trend:     [name], RN 11/24/2022 1:21 PM

## 2022-12-10 ENCOUNTER — Ambulatory Visit: Payer: Self-pay | Admitting: *Deleted

## 2022-12-10 ENCOUNTER — Telehealth: Payer: Medicare HMO | Admitting: Family Medicine

## 2022-12-10 DIAGNOSIS — N3000 Acute cystitis without hematuria: Secondary | ICD-10-CM | POA: Diagnosis not present

## 2022-12-10 MED ORDER — CIPROFLOXACIN HCL 500 MG PO TABS: 500.0000 mg | ORAL_TABLET | Freq: Two times a day (BID) | ORAL | 0 refills | Status: AC

## 2022-12-10 NOTE — Progress Notes (Signed)
Virtual Visit Consent   Michael Delacruz, you are scheduled for a virtual visit with a [place] provider today. Just as with appointments in the office, your consent must be obtained to participate. Your consent will be active for this visit and any virtual visit you may have with one of our providers in the next 365 days. If you have a MyChart account, a copy of this consent can be sent to you electronically.  As this is a virtual visit, video technology does not allow for your provider to perform a traditional examination. This may limit your provider's ability to fully assess your condition. If your provider identifies any concerns that need to be evaluated in person or the need to arrange testing (such as labs, EKG, etc.), we will make arrangements to do so. Although advances in technology are sophisticated, we cannot ensure that it will always work on either your end or our end. If the connection with a video visit is poor, the visit may have to be switched to a telephone visit. With either a video or telephone visit, we are not always able to ensure that we have a secure connection.  By engaging in this virtual visit, you consent to the provision of healthcare and authorize for your insurance to be billed (if applicable) for the services provided during this visit. Depending on your insurance coverage, you may receive a charge related to this service.  I need to obtain your verbal consent now. Are you willing to proceed with your visit today? Michael Delacruz has provided verbal consent on 12/10/2022 for a virtual visit (video or telephone). Dellia Nims, FNP  Date: 12/10/2022 3:31 PM  Virtual Visit via Video Note   I, Dellia Nims, connected with  Michael Delacruz  ([id number], 1941-02-25) on 12/10/22 at  3:30 PM EST by a video-enabled telemedicine application and verified that I am speaking with the correct person using two identifiers.  Location: Patient: Virtual Visit Location Patient:  Home Provider: Virtual Visit Location Provider: Home Office   I discussed the limitations of evaluation and management by telemedicine and the availability of in person appointments. The patient expressed understanding and agreed to proceed.    History of Present Illness: Michael Delacruz is a 81 y.o. who identifies as a male who was assigned male at birth, and is being seen today for burning and frequency with urination. No fever or back pain. He says when he has to go he has to go right then. His wife is with him today. The last UTI 3 yrs ago he had to be hospitalized.   HPI: HPI  Problems:  Patient Active Problem List   Diagnosis Date Noted   Failure to thrive in adult 02/01/2021   Altered mental status 01/31/2021   CHB (complete heart block) ([place]) 01/31/2021   Encephalopathy 01/31/2021   Generalized anxiety disorder 05/04/2018   OSA (obstructive sleep apnea) 05/04/2018   History of stroke 05/04/2018   Retinal hemorrhage, left eye 05/04/2018   Late effects of cerebral ischemic stroke 05/04/2018   Hypothyroidism 05/04/2018   Permanent atrial fibrillation ([place]) 05/02/2018   Tachycardia induced cardiomyopathy ([place]) 05/02/2018   Hypokalemia 05/02/2018   Constipation 04/25/2018   Insomnia due to medical condition 02/22/2018   Depression, major, recurrent, moderate ([place]) 01/16/2018   Somatic symptom disorder 01/16/2018   Vision loss 12/31/2015   TIA (transient ischemic attack) 12/06/2015   S/P MVR (mitral valve repair) 11/11/2015   S/P Minimally invasive maze operation for atrial  fibrillation 37/09/6268   Systolic CHF, chronic (HCC)    Atrial fibrillation with RVR (HCC)    Centrilobular emphysema (HCC)    Hypotension 11/02/2012   Stress at home 11/02/2012   Smoking 04/27/2012   Hyperlipidemia 04/27/2012   CAD (coronary artery disease) 04/27/2012   Heart palpitations 09/03/2011   Dizziness 05/19/2011   MVP (mitral valve prolapse) 05/05/2011   Mitral valve regurgitation  05/05/2011   Pulmonary HTN ([place]) 05/05/2011    Allergies:  Allergies  Allergen Reactions   Codeine Nausea Only   Digoxin And Related     SOB, bad dreams   Macrodantin [Nitrofurantoin Macrocrystal] Rash   Morphine And Related Rash   Medications:  Current Outpatient Medications:    ciprofloxacin (CIPRO) 500 MG tablet, Take 1 tablet (500 mg total) by mouth 2 (two) times daily for 10 days., Disp: 6 tablet, Rfl: 0   amoxicillin-clavulanate (AUGMENTIN) 875-125 MG tablet, Take 1 tablet by mouth 2 (two) times daily. (Patient not taking: Reported on 11/08/2022), Disp: 20 tablet, Rfl: 0   chlorpheniramine-HYDROcodone (TUSSIONEX) 10-8 MG/5ML, Take 5 mLs by mouth 2 (two) times daily. (Patient not taking: Reported on 11/08/2022), Disp: 70 mL, Rfl: 0   furosemide (LASIX) 40 MG tablet, Take 1 tablet (40 mg total) by mouth daily as needed (As needed for weight gain or shortness of breath)., Disp: 90 tablet, Rfl: 3   ipratropium (ATROVENT) 0.06 % nasal spray, Place 2 sprays into both nostrils 4 (four) times daily. As needed (Patient not taking: Reported on 10/25/2022), Disp: 15 mL, Rfl: 3   QUEtiapine (SEROQUEL) 25 MG tablet, Take 1 tablet (25 mg total) by mouth at bedtime., Disp: 90 tablet, Rfl: 3   sertraline (ZOLOFT) 25 MG tablet, TAKE 1 TABLET (25 MG TOTAL) BY MOUTH DAILY. (Patient not taking: Reported on 09/28/2022), Disp: 90 tablet, Rfl: 1  Observations/Objective: Patient is well-developed, well-nourished in no acute distress.  Resting comfortably  at home.  Head is normocephalic, atraumatic.  No labored breathing.  Speech is clear and coherent with logical content.  Patient is alert and oriented at baseline.    Assessment and Plan: 1. Acute cystitis without hematuria  Increase fluids, med use and side effects discussed, urgent care if sx persist or worsen.   Follow Up Instructions: I discussed the assessment and treatment plan with the patient. The patient was provided an opportunity to ask  questions and all were answered. The patient agreed with the plan and demonstrated an understanding of the instructions.  A copy of instructions were sent to the patient via MyChart unless otherwise noted below.     The patient was advised to call back or seek an in-person evaluation if the symptoms worsen or if the condition fails to improve as anticipated.  Time:  I spent 10 minutes with the patient via telehealth technology discussing the above problems/concerns.    Dellia Nims, FNP

## 2022-12-10 NOTE — Telephone Encounter (Signed)
Patient wife answering triage: Chief Complaint: urinary pain Symptoms: pain, urgency, frequency  Frequency: symptoms started 3-4 days ago Pertinent Negatives: Patient denies fever Disposition: '[]'$ ED /'[x]'$ Urgent Care (no appt availability in office) / '[]'$ Appointment(In office/virtual)/ '[]'$  [facility]/ '[]'$ Home Care/ '[]'$ Refused Recommended Disposition /'[]'$  Mobile Bus/ '[]'$  Follow-up with PCP Additional Notes: Advised UC virtual/in person appointment given patient history- OTC bladder numbing medication can help symptoms - but they are not going to cure the problem. They will schedule when they get home- advised call back if she has any problems

## 2022-12-10 NOTE — Telephone Encounter (Signed)
Summary: Urinary frequency and discomfort advice   Pts spouse is calling to report that the pt has frequency urination with discomfort. Wife denies wanting appt. Wanting is there something OTC that the patient can take. Please advise         Reason for Disposition  All other males with painful urination  Answer Assessment - Initial Assessment Questions 1. SEVERITY: "How bad is the pain?"  (e.g., Scale 1-10; mild, moderate, or severe)   - MILD (1-3): Complains slightly about urination hurting.   - MODERATE (4-7): Interferes with normal activities.     - SEVERE (8-10): Excruciating, unwilling or unable to urinate because of the pain.      moderate 2. FREQUENCY: "How many times have you had painful urination today?"      Urgency and frequency 3. PATTERN: "Is pain present every time you urinate or just sometimes?"      Every time 4. ONSET: "When did the painful urination start?"      3-4 days 5. FEVER: "Do you have a fever?" If Yes, ask: "What is your temperature, how was it measured, and when did it start?"     no 6. PAST UTI: "Have you had a urine infection before?" If Yes, ask: "When was the last time?" and "What happened that time?"      Yes- few years- had to be hospitalized  7. CAUSE: "What do you think is causing the painful urination?"      UTI 8. OTHER SYMPTOMS: "Do you have any other symptoms?" (e.g., flank pain, penis discharge, scrotal pain, blood in urine)     Not sure  Protocols used: Urination Pain - Male-A-AH

## 2022-12-10 NOTE — Patient Instructions (Signed)
Urinary Tract Infection, Adult  A urinary tract infection (UTI) is an infection of any part of the urinary tract. The urinary tract includes the kidneys, ureters, bladder, and urethra. These organs make, store, and get rid of urine in the body. An upper UTI affects the ureters and kidneys. A lower UTI affects the bladder and urethra. What are the causes? Most urinary tract infections are caused by bacteria in your genital area around your urethra, where urine leaves your body. These bacteria grow and cause inflammation of your urinary tract. What increases the risk? You are more likely to develop this condition if: You have a urinary catheter that stays in place. You are not able to control when you urinate or have a bowel movement (incontinence). You are male and you: Use a spermicide or diaphragm for birth control. Have low estrogen levels. Are pregnant. You have certain genes that increase your risk. You are sexually active. You take antibiotic medicines. You have a condition that causes your flow of urine to slow down, such as: An enlarged prostate, if you are male. Blockage in your urethra. A kidney stone. A nerve condition that affects your bladder control (neurogenic bladder). Not getting enough to drink, or not urinating often. You have certain medical conditions, such as: Diabetes. A weak disease-fighting system (immunesystem). Sickle cell disease. Gout. Spinal cord injury. What are the signs or symptoms? Symptoms of this condition include: Needing to urinate right away (urgency). Frequent urination. This may include small amounts of urine each time you urinate. Pain or burning with urination. Blood in the urine. Urine that smells bad or unusual. Trouble urinating. Cloudy urine. Vaginal discharge, if you are male. Pain in the abdomen or the lower back. You may also have: Vomiting or a decreased appetite. Confusion. Irritability or tiredness. A fever or  chills. Diarrhea. The first symptom in older adults may be confusion. In some cases, they may not have any symptoms until the infection has worsened. How is this diagnosed? This condition is diagnosed based on your medical history and a physical exam. You may also have other tests, including: Urine tests. Blood tests. Tests for STIs (sexually transmitted infections). If you have had more than one UTI, a cystoscopy or imaging studies may be done to determine the cause of the infections. How is this treated? Treatment for this condition includes: Antibiotic medicine. Over-the-counter medicines to treat discomfort. Drinking enough water to stay hydrated. If you have frequent infections or have other conditions such as a kidney stone, you may need to see a health care provider who specializes in the urinary tract (urologist). In rare cases, urinary tract infections can cause sepsis. Sepsis is a life-threatening condition that occurs when the body responds to an infection. Sepsis is treated in the hospital with IV antibiotics, fluids, and other medicines. Follow these instructions at home:  Medicines Take over-the-counter and prescription medicines only as told by your health care provider. If you were prescribed an antibiotic medicine, take it as told by your health care provider. Do not stop using the antibiotic even if you start to feel better. General instructions Make sure you: Empty your bladder often and completely. Do not hold urine for long periods of time. Empty your bladder after sex. Wipe from front to back after urinating or having a bowel movement if you are male. Use each tissue only one time when you wipe. Drink enough fluid to keep your urine pale yellow. Keep all follow-up visits. This is important. Contact a health   care provider if: Your symptoms do not get better after 1-2 days. Your symptoms go away and then return. Get help right away if: You have severe pain in  your back or your lower abdomen. You have a fever or chills. You have nausea or vomiting. Summary A urinary tract infection (UTI) is an infection of any part of the urinary tract, which includes the kidneys, ureters, bladder, and urethra. Most urinary tract infections are caused by bacteria in your genital area. Treatment for this condition often includes antibiotic medicines. If you were prescribed an antibiotic medicine, take it as told by your health care provider. Do not stop using the antibiotic even if you start to feel better. Keep all follow-up visits. This is important. This information is not intended to replace advice given to you by your health care provider. Make sure you discuss any questions you have with your health care provider. Document Revised: 07/18/2020 Document Reviewed: 07/18/2020 Elsevier Patient Education  2023 Elsevier Inc.  

## 2022-12-16 ENCOUNTER — Telehealth: Payer: Self-pay | Admitting: Family Medicine

## 2022-12-16 NOTE — Telephone Encounter (Signed)
Left message for patient to call back and schedule the Medicare Annual Wellness Visit (AWV) virtually or by telephone.  Last AWV 01/01/22  Please schedule at anytime with [place].    Any questions, please call me at [phone]

## 2023-01-03 ENCOUNTER — Ambulatory Visit (INDEPENDENT_AMBULATORY_CARE_PROVIDER_SITE_OTHER): Payer: Medicare HMO

## 2023-01-03 DIAGNOSIS — Z950 Presence of cardiac pacemaker: Secondary | ICD-10-CM

## 2023-01-03 DIAGNOSIS — I5020 Unspecified systolic (congestive) heart failure: Secondary | ICD-10-CM | POA: Diagnosis not present

## 2023-01-05 ENCOUNTER — Telehealth: Payer: Self-pay

## 2023-01-05 ENCOUNTER — Other Ambulatory Visit: Payer: Self-pay | Admitting: Family Medicine

## 2023-01-05 DIAGNOSIS — G4701 Insomnia due to medical condition: Secondary | ICD-10-CM

## 2023-01-05 DIAGNOSIS — F331 Major depressive disorder, recurrent, moderate: Secondary | ICD-10-CM

## 2023-01-05 DIAGNOSIS — F411 Generalized anxiety disorder: Secondary | ICD-10-CM

## 2023-01-05 NOTE — Telephone Encounter (Signed)
Requested medication (s) are due for refill today: yes  Requested medication (s) are on the active medication list: yes  Last refill:  12/08/21 #90 3 RF  Future visit scheduled: no  Notes to clinic:  overdue lab work and med cannot be delegated to NT to refill   Requested Prescriptions  Pending Prescriptions Disp Refills   QUEtiapine (SEROQUEL) 25 MG tablet [Pharmacy Med Name: QUETIAPINE FUMARATE 25 MG TAB] 90 tablet 3    Sig: TAKE 1 TABLET BY MOUTH EVERYDAY AT BEDTIME     Not Delegated - Psychiatry:  Antipsychotics - Second Generation (Atypical) - quetiapine Failed - 01/05/2023  1:21 AM      Failed - This refill cannot be delegated      Failed - TSH in normal range and within 360 days    TSH  Date Value Ref Range Status  01/30/2021 1.029 0.350 - 4.500 uIU/mL Final    Comment:    Performed by a 3rd Generation assay with a functional sensitivity of <=0.01 uIU/mL. Performed at [facility], [address] [place], [place] [address]   02/14/2019 3.720 0.450 - 4.500 uIU/mL Final         Failed - Lipid Panel in normal range within the last 12 months    Cholesterol  Date Value Ref Range Status  05/01/2018 215 (H) 0 - 200 mg/dL Final   LDL Cholesterol  Date Value Ref Range Status  05/01/2018 157 (H) 0 - 99 mg/dL Final    Comment:           Total Cholesterol/HDL:CHD Risk Coronary Heart Disease Risk Table                     Men   Women  1/2 Average Risk   3.4   3.3  Average Risk       5.0   4.4  2 X Average Risk   9.6   7.1  3 X Average Risk  23.4   11.0        Use the calculated Patient Ratio above and the CHD Risk Table to determine the patient's CHD Risk.        ATP III CLASSIFICATION (LDL):  <100     mg/dL   Optimal  100-129  mg/dL   Near or Above                    Optimal  130-159  mg/dL   Borderline  160-189  mg/dL   High  >190     mg/dL   Very High Performed at [facility], [place], [place] [address]    HDL  Date  Value Ref Range Status  05/01/2018 42 >40 mg/dL Final   Triglycerides  Date Value Ref Range Status  05/01/2018 79 <150 mg/dL Final         Failed - CBC within normal limits and completed in the last 12 months    WBC  Date Value Ref Range Status  04/25/2022 5.1 4.0 - 10.5 K/uL Final   RBC  Date Value Ref Range Status  04/25/2022 4.60 4.22 - 5.81 MIL/uL Final   Hemoglobin  Date Value Ref Range Status  04/25/2022 14.2 13.0 - 17.0 g/dL Final  05/21/2020 15.8 13.0 - 17.7 g/dL Final   HCT  Date Value Ref Range Status  04/25/2022 43.9 39.0 - 52.0 % Final   Hematocrit  Date Value Ref Range Status  05/21/2020 44.9 37.5 -  51.0 % Final   MCHC  Date Value Ref Range Status  04/25/2022 32.3 30.0 - 36.0 g/dL Final   [facility]  Date Value Ref Range Status  04/25/2022 30.9 26.0 - 34.0 pg Final   MCV  Date Value Ref Range Status  04/25/2022 95.4 80.0 - 100.0 fL Final  05/21/2020 93 79 - 97 fL Final  01/24/2015 89 80 - 100 fL Final   No results found for: "PLTCOUNTKUC", "LABPLAT", "POCPLA" RDW  Date Value Ref Range Status  04/25/2022 13.0 11.5 - 15.5 % Final  05/21/2020 12.0 11.6 - 15.4 % Final  01/24/2015 14.0 11.5 - 14.5 % Final         Failed - CMP within normal limits and completed in the last 12 months    Albumin  Date Value Ref Range Status  01/31/2021 3.2 (L) 3.5 - 5.0 g/dL Final  11/07/2018 4.0 3.5 - 4.8 g/dL Final  01/24/2015 3.7 3.4 - 5.0 g/dL Final   Alkaline Phosphatase  Date Value Ref Range Status  01/31/2021 81 38 - 126 U/L Final  01/24/2015 83 46 - 116 Unit/L Final   ALT  Date Value Ref Range Status  01/31/2021 77 (H) 0 - 44 U/L Final   SGPT (ALT)  Date Value Ref Range Status  01/24/2015 19 14 - 63 U/L Final   AST  Date Value Ref Range Status  01/31/2021 100 (H) 15 - 41 U/L Final   SGOT(AST)  Date Value Ref Range Status  01/24/2015 13 (L) 15 - 37 Unit/L Final   BUN  Date Value Ref Range Status  04/25/2022 11 8 - 23 mg/dL Final  05/21/2020 10  8 - 27 mg/dL Final  01/24/2015 12 7 - 18 mg/dL Final   Calcium  Date Value Ref Range Status  04/25/2022 8.9 8.9 - 10.3 mg/dL Final   Calcium, Total  Date Value Ref Range Status  01/24/2015 10.2 (H) 8.5 - 10.1 mg/dL Final   Calcium, Ion  Date Value Ref Range Status  10/23/2015 1.12 (L) 1.13 - 1.30 mmol/L Final   CO2  Date Value Ref Range Status  04/25/2022 24 22 - 32 mmol/L Final   Co2  Date Value Ref Range Status  01/24/2015 28 21 - 32 mmol/L Final   Bicarbonate  Date Value Ref Range Status  01/31/2021 26.3 20.0 - 28.0 mmol/L Final   TCO2  Date Value Ref Range Status  10/23/2015 18 0 - 100 mmol/L Final   Creatinine  Date Value Ref Range Status  01/24/2015 1.12 0.60 - 1.30 mg/dL Final   Creat  Date Value Ref Range Status  05/31/2011 1.10 0.50 - 1.35 mg/dL Final   Creatinine, Ser  Date Value Ref Range Status  04/25/2022 1.29 (H) 0.61 - 1.24 mg/dL Final   Glucose  Date Value Ref Range Status  01/24/2015 106 (H) 65 - 99 mg/dL Final   Glucose, Bld  Date Value Ref Range Status  04/25/2022 99 70 - 99 mg/dL Final    Comment:    Glucose reference range applies only to samples taken after fasting for at least 8 hours.   Glucose-Capillary  Date Value Ref Range Status  10/24/2015 128 (H) 65 - 99 mg/dL Final   Potassium  Date Value Ref Range Status  04/25/2022 4.0 3.5 - 5.1 mmol/L Final  01/24/2015 4.2 3.5 - 5.1 mmol/L Final   Sodium  Date Value Ref Range Status  04/25/2022 142 135 - 145 mmol/L Final  05/21/2020 140 134 - 144 mmol/L Final  01/24/2015 142 136 - 145 mmol/L Final   Total Bilirubin  Date Value Ref Range Status  01/31/2021 1.1 0.3 - 1.2 mg/dL Final   Bilirubin,Total  Date Value Ref Range Status  01/24/2015 0.4 0.2 - 1.0 mg/dL Final   Bilirubin Total  Date Value Ref Range Status  11/07/2018 0.7 0.0 - 1.2 mg/dL Final   Bilirubin, Direct  Date Value Ref Range Status  01/16/2018 0.1 0.1 - 0.5 mg/dL Final   Indirect Bilirubin  Date Value  Ref Range Status  01/16/2018 0.4 0.3 - 0.9 mg/dL Final    Comment:    Performed at [facility], [address] [place], [place] [address]   Protein, ur  Date Value Ref Range Status  01/30/2021 NEGATIVE NEGATIVE mg/dL Final   Protein, UA  Date Value Ref Range Status  02/24/2022 Positive (A) Negative Final   Total Protein  Date Value Ref Range Status  01/31/2021 6.7 6.5 - 8.1 g/dL Final  11/07/2018 6.4 6.0 - 8.5 g/dL Final  01/24/2015 7.6 6.4 - 8.2 g/dL Final   EGFR (African American)  Date Value Ref Range Status  01/24/2015 >60 >38m/min Final   GFR calc Af Amer  Date Value Ref Range Status  05/21/2020 77 >59 mL/min/1.73 Final    Comment:    **[facility] currently reports eGFR in compliance with the current**   recommendations of the [employer] [facility] will   update reporting as new guidelines are published from the NKF-ASN   Task force.    EGFR (Non-African Amer.)  Date Value Ref Range Status  01/24/2015 >60 >658mmin Final    Comment:    eGFR values <6054min/1.73 m2 may be an indication of chronic kidney disease (CKD). Calculated eGFR, using the MRDR Study equation, is useful in  patients with stable renal function. The eGFR calculation will not be reliable in acutely ill patients when serum creatinine is changing rapidly. It is not useful in patients on dialysis. The eGFR calculation may not be applicable to patients at the low and high extremes of body sizes, pregnant women, and vegetarians.    GFR, Estimated  Date Value Ref Range Status  04/25/2022 57 (L) >60 mL/min Final    Comment:    (NOTE) Calculated using the CKD-EPI Creatinine Equation (2021)          Passed - Completed PHQ-2 or PHQ-9 in the last 360 days      Passed - Last BP in normal range    BP Readings from Last 1 Encounters:  11/08/22 110/60         Passed - Last Heart Rate in normal range    Pulse Readings from Last 1 Encounters:  11/08/22 88          Passed - Valid encounter within last 6 months    Recent Outpatient Visits           2 months ago Acute non-recurrent pansinusitis   [facility]   4 months ago [facility]   9 months ago Hordeolum externum of right lower eyelid   [facility]   10 months ago Acute cystitis with hematuria   [facility]   1 year ago Neck pain on right side   [facility]

## 2023-01-05 NOTE — Telephone Encounter (Signed)
Remote ICM transmission received.  Attempted call to patient regarding ICM remote transmission and left detailed message per DPR.  Advised to return call for any fluid symptoms or questions. Next ICM remote transmission scheduled 02/08/2023.

## 2023-01-05 NOTE — Progress Notes (Signed)
EPIC Encounter for ICM Monitoring  Patient Name: Michael Delacruz is a 82 y.o. male Date: 01/05/2023 Primary Care Physican: [name], DO Primary Cardiologist: [name] Electrophysiologist: Vergie Living Pacing: 91%            05/04/2022 Office Weight: 178 lbs 08/25/2022 Weight: 172 lbs 10/08/2022 Weight: 170-172 lbs                                                            Attempted call to patient and unable to reach.  Left detailed message per DPR regarding transmission. Transmission reviewed.     Diet:  Does not follow low salt diet and uses salt at home.     CorVue thoracic impedance suggesting normal fluid levels with exception of possible fluid accumulation from 12/8-12/19 and 1/4-1/8.   Prescribed: Furosemide 40 mg Take 1 tablet (40 mg total) by mouth daily as needed (As needed for weight gain or shortness of breath).   Recommendations: Left voice mail with ICM number and encouraged to call if experiencing any fluid symptoms.   Follow-up plan: ICM clinic phone appointment on 02/08/2023.   91 day device clinic remote transmission 01/25/2023.     EP/Cardiology Office Visits:  Recall 11/19/024 with Dr. [name].   Recall 09/28/2023 with Dr Caryl Comes.    Copy of ICM check sent to Dr. Caryl Comes.      3 month ICM trend: 01/03/2023.    12-14 Month ICM trend:     [name], RN 01/05/2023 5:13 PM

## 2023-01-25 ENCOUNTER — Ambulatory Visit: Payer: Medicare HMO

## 2023-01-25 DIAGNOSIS — I442 Atrioventricular block, complete: Secondary | ICD-10-CM

## 2023-01-25 LAB — CUP PACEART REMOTE DEVICE CHECK
Battery Remaining Longevity: 43 mo
Battery Remaining Percentage: 50 %
Battery Voltage: 2.98 V
Date Time Interrogation Session: 20240206022922
Implantable Lead Connection Status: 753985
Implantable Lead Connection Status: 753985
Implantable Lead Implant Date: 20190515
Implantable Lead Implant Date: 20190515
Implantable Lead Location: 753858
Implantable Lead Location: 753860
Implantable Lead Model: 5076
Implantable Pulse Generator Implant Date: 20190515
Lead Channel Impedance Value: 510 Ohm
Lead Channel Impedance Value: 580 Ohm
Lead Channel Pacing Threshold Amplitude: 0.5 V
Lead Channel Pacing Threshold Amplitude: 0.75 V
Lead Channel Pacing Threshold Pulse Width: 0.5 ms
Lead Channel Pacing Threshold Pulse Width: 0.5 ms
Lead Channel Sensing Intrinsic Amplitude: 7.2 mV
Lead Channel Setting Pacing Amplitude: 2 V
Lead Channel Setting Pacing Amplitude: 2.5 V
Lead Channel Setting Pacing Pulse Width: 0.5 ms
Lead Channel Setting Pacing Pulse Width: 0.5 ms
Lead Channel Setting Sensing Sensitivity: 2 mV
Pulse Gen Model: 3562
Pulse Gen Serial Number: 9427031

## 2023-01-28 ENCOUNTER — Ambulatory Visit (INDEPENDENT_AMBULATORY_CARE_PROVIDER_SITE_OTHER): Payer: Medicare HMO

## 2023-01-28 VITALS — Ht 73.0 in | Wt 174.0 lb

## 2023-01-28 DIAGNOSIS — Z0000 Encounter for general adult medical examination without abnormal findings: Secondary | ICD-10-CM

## 2023-01-28 NOTE — Progress Notes (Signed)
Virtual Visit via Telephone Note  I connected with  Michael Delacruz on 01/28/23 at  8:15 AM EST by telephone and verified that I am speaking with the correct person using two identifiers.  Location: Patient: home Provider: [facility] Persons participating in the virtual visit: [facility]   I discussed the limitations, risks, security and privacy concerns of performing an evaluation and management service by telephone and the availability of in person appointments. The patient expressed understanding and agreed to proceed.  Interactive audio and video telecommunications were attempted between this nurse and patient, however failed, due to patient having technical difficulties OR patient did not have access to video capability.  We continued and completed visit with audio only.  Some vital signs may be absent or patient reported.   Dionisio David, LPN  Subjective:   Michael Delacruz is a 82 y.o. male who presents for Medicare Annual/Subsequent preventive examination.  Review of Systems     Cardiac Risk Factors include: advanced age (>71mn, >>51women);smoking/ tobacco exposure;male gender     Objective:    Today's Vitals   01/28/23 0816  PainSc: 4    There is no height or weight on file to calculate BMI.     01/28/2023    8:24 AM 04/25/2022    3:41 PM 01/01/2022    2:17 PM 03/04/2021    1:05 PM 01/31/2021    3:08 AM 01/30/2021    7:59 PM 12/23/2020    9:21 AM  Advanced Directives  Does Patient Have a Medical Advance Directive? No No No No No No No  Would patient like information on creating a medical advance directive? No - Patient declined No - Patient declined No - Patient declined  No - Patient declined      Current Medications (verified) Outpatient Encounter Medications as of 01/28/2023  Medication Sig   furosemide (LASIX) 40 MG tablet Take 1 tablet (40 mg total) by mouth daily as needed (As needed for weight gain or shortness of breath).    amoxicillin-clavulanate (AUGMENTIN) 875-125 MG tablet Take 1 tablet by mouth 2 (two) times daily. (Patient not taking: Reported on 11/08/2022)   chlorpheniramine-HYDROcodone (TUSSIONEX) 10-8 MG/5ML Take 5 mLs by mouth 2 (two) times daily. (Patient not taking: Reported on 11/08/2022)   ipratropium (ATROVENT) 0.06 % nasal spray Place 2 sprays into both nostrils 4 (four) times daily. As needed (Patient not taking: Reported on 10/25/2022)   QUEtiapine (SEROQUEL) 25 MG tablet TAKE 1 TABLET BY MOUTH EVERYDAY AT BEDTIME (Patient not taking: Reported on 01/28/2023)   sertraline (ZOLOFT) 25 MG tablet TAKE 1 TABLET (25 MG TOTAL) BY MOUTH DAILY. (Patient not taking: Reported on 09/28/2022)   No facility-administered encounter medications on file as of 01/28/2023.    Allergies (verified) Codeine, Digoxin and related, Macrodantin [nitrofurantoin macrocrystal], and Morphine and related   History: Past Medical History:  Diagnosis Date   Anxiety    Arthritis    Ascending aortic aneurysm ([place]    a. 11/2018 Echo: Ao root 43m Asc Ao 4175mb. 02/2021 Echo: Ao root 8m85m. 04/2021 CT chest: Asc Ao 4.5cm.   Asthma    Bradycardia    CAD in native artery    a. LHC 09/2015: 40% pCx, 35% mRCA.   Chronic HFrEF (heart failure with reduced ejection fraction) (HCC)    COPD (chronic obstructive pulmonary disease) (HCC)    Dilation of intestine 01/2015   Fibromyalgia    Frequent headaches    GERD (gastroesophageal reflux disease)  History of blood clots    eye    History of hiatal hernia    History of kidney stones    History of rheumatic fever    Hypertension    NICM (nonischemic cardiomyopathy) ([place])    a. 07/2017 Echo: EF reduced to 40-45%; b. 07/2018 Echo: EF 25-30%; c. 11/2018 Echo: EF 30-35%; d. 02/2021 Echo: EF 45-50%, septal and apical septal HK. Mild MR. Sev dil LA. Mod dil RA. Ao root 63m.   Permanent atrial fibrillation ([place]    a. s/p TEE/DCCV 07/2015. b. H/o bleeding on Coumadin when INR >5, changed  to Eliquis-subsequently discontinued; c. 04/2018 s/p SJM 3562 Quadra Allure MP DC PPM & AVN ablation.   S/P Minimally invasive maze operation for atrial fibrillation 10/22/2015   Complete bilateral atrial lesion set using cryothermy and bipolar radiofrequency ablation with clipping of LA appendage via right mini thoracotomy approach   S/P minimally invasive mitral valve repair 10/22/2015   Complex valvuloplasty including triangular resection of posterior leaflet, artificial Gore-tex neochord placement x6 and 38 mm Sorin Memo 3D Rechord ring annuloplasty via right minithoracotomy approach   Severe mitral regurgitation s/p MVR    a. s/p MV repair 10/2015; b. 11/2018 Echo: Mild MS (mean grad 554mg); d. 02/2021 Echo: Mild MR. Mean grad 46m26m.   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, on occasion sees a pinpoint    Thyroid disorder    TIA (transient ischemic attack)    Tobacco abuse    Past Surgical History:  Procedure Laterality Date   ANKLE SURGERY     AV NODE ABLATION N/A 05/04/2018   Procedure: AV NODE ABLATION;  Surgeon: AllThompson GrayerD;  Location: [facility];  Service: Cardiovascular;  Laterality: N/A;   BIV PACEMAKER INSERTION CRT-P N/A 05/03/2018   Procedure: BIV PACEMAKER INSERTION CRT-P;  Surgeon: KleDeboraha SprangD;  Location: [facility] [place];  Service: Cardiovascular;  Laterality: N/A;   CARDIAC CATHETERIZATION N/A 10/03/2015   Procedure: Right and Left Heart Cath and Coronary Angiography;  Surgeon: TimMinna MerrittsD;  [facility];  Service: Cardiovascular;  Laterality: N/A;   COLONOSCOPY     ELECTROPHYSIOLOGIC STUDY N/A 08/18/2015   Procedure: CARDIOVERSION;  Surgeon: MuhWellington HampshireD;  Location: [facility];  Service: Cardiovascular;  Laterality: N/A;   MASS EXCISION N/A 03/04/2021   Procedure: EXCISION INNER NASAL CANCER;  Surgeon: NewRozetta NunneryD;  [facility];  Service:  ENT;  Laterality: N/A;   MINIMALLY INVASIVE MAZE PROCEDURE N/A 10/22/2015   Procedure: MINIMALLY INVASIVE MAZE PROCEDURE;  Surgeon: ClaRexene AlbertsD;  [facility] [place];  Laterality: N/A;   MITRAL VALVE REPAIR Right 10/22/2015   Procedure: MINIMALLY INVASIVE MITRAL VALVE REPAIR (MVR);  Surgeon: ClaRexene AlbertsD;  [facility] [place] [facility];  Laterality: Right;   SINUS EXPLORATION     SKIN FULL THICKNESS GRAFT N/A 03/04/2021   Procedure: SKIN GRAFT FULL THICKNESS;  Surgeon: NewRozetta NunneryD;  [facility];  Service: ENT;  Laterality: N/A;   TEE WITHOUT CARDIOVERSION N/A 08/18/2015   Procedure: TRANSESOPHAGEAL ECHOCARDIOGRAM (TEE);  Surgeon: MuhWellington HampshireD;  Location: [facility];  Service: Cardiovascular;  Laterality: N/A;   TEE WITHOUT CARDIOVERSION N/A 10/22/2015   Procedure: TRANSESOPHAGEAL ECHOCARDIOGRAM (TEE);  Surgeon: ClaRexene AlbertsD;  [facility];  Service: Open Heart Surgery;  Laterality: N/A;   Family History  Problem Relation Age of Onset   Stroke Mother    Irregular heart beat Mother    Heart murmur Brother    Pulmonary embolism Brother    Hypertension Other    Pulmonary embolism Maternal Uncle    Social History   Socioeconomic History   Marital status: Married    Spouse name: [name]   Number of children: 3   Years of education: GED   Highest education level: GED or equivalent  Occupational History   Not on file  Tobacco Use   Smoking status: Every Day    Packs/day: 0.50    Years: 60.00    Total pack years: 30.00    Types: Cigarettes    Start date: 1960   Smokeless tobacco: Never   Tobacco comments:    Resumed smoking, after quit 01/2018  Vaping Use   Vaping Use: Never used  Substance and Sexual Activity   Alcohol use: Not Currently    Alcohol/week: 1.0 standard drink of alcohol    Types: 1 Standard drinks or equivalent per week    Comment: occasionally drinks a margarita   Drug use: No    Sexual activity: Not on file  Other Topics Concern   Not on file  Social History Narrative   Right handed    2-3 cups coffee per day         Social Determinants of Health   Financial Resource Strain: Low Risk  (01/28/2023)   Overall Financial Resource Strain (CARDIA)    Difficulty of Paying Living Expenses: Not very hard  Food Insecurity: No Food Insecurity (01/28/2023)   Hunger Vital Sign    Worried About Running Out of Food in the Last Year: Never true    Ran Out of Food in the Last Year: Never true  Transportation Needs: No Transportation Needs (01/28/2023)   PRAPARE - Hydrologist (Medical): No    Lack of Transportation (Non-Medical): No  Physical Activity: Inactive (01/28/2023)   Exercise Vital Sign    Days of Exercise per Week: 0 days    Minutes of Exercise per Session: 0 min  Stress: No Stress Concern Present (01/28/2023)   [place]    Feeling of Stress : Only a little  Social Connections: Moderately Isolated (01/28/2023)   Social Connection and Isolation Panel [NHANES]    Frequency of Communication with Friends and Family: Twice a week    Frequency of Social Gatherings with Friends and Family: More than three times a week    Attends Religious Services: Never    Marine scientist or Organizations: No    Attends Archivist Meetings: Never    Marital Status: Married    Tobacco Counseling Ready to quit: Not Answered Counseling given: Not Answered Tobacco comments: Resumed smoking, after quit 01/2018   Clinical Intake:  Pre-visit preparation completed: Yes  Pain : 0-10 Pain Score: 4  Pain Type: Chronic pain     Diabetes: No  How often do you need to have someone help you when you read instructions, pamphlets, or other written materials from your doctor or pharmacy?: 1 - Never  Diabetic?no  Interpreter Needed?: No  Information entered by :: Kirke Shaggy, LPN   Activities of Daily Living    01/28/2023    8:25 AM  In your present state of health, do you  have any difficulty performing the following activities:  Hearing? 0  Vision? 0  Difficulty concentrating or making decisions? 0  Walking or climbing stairs? 0  Dressing or bathing? 0  Doing errands, shopping? 0  Preparing Food and eating ? N  Using the Toilet? N  In the past six months, have you accidently leaked urine? N  Do you have problems with loss of bowel control? N  Managing your Medications? N  Managing your Finances? N  Housekeeping or managing your Housekeeping? N    Patient Care Team: [name], DO as PCP - General (Family Medicine) [name], MD as PCP - Cardiology (Cardiology) [name], MD as Consulting Physician (Cardiology) Rebekah Chesterfield, LCSW as Social Worker (Licensed Clinical Social Worker)  Indicate any recent [employer] you may have received from other than [facility] providers in the past year (date may be approximate).     Assessment:   This is a routine wellness examination for Ezekiel.  Hearing/Vision screen Hearing Screening - Comments:: No aids Vision Screening - Comments:: Wears glasses to read-   Dietary issues and exercise activities discussed: Current Exercise Habits: The patient does not participate in regular exercise at present   Goals Addressed             This Visit's Progress    DIET - REDUCE SUGAR INTAKE         Depression Screen    01/28/2023    8:21 AM 08/09/2022   10:14 AM 03/24/2022    1:13 PM 02/24/2022    9:12 AM 01/01/2022    2:16 PM 12/23/2020    9:30 AM 06/09/2020    3:48 PM  PHQ 2/9 Scores  PHQ - 2 Score 1 4 4 $ 0 0 3 3  PHQ- 9 Score 3 9 9 5  8 12    $ Fall Risk    01/28/2023    8:25 AM 08/09/2022   10:14 AM 02/24/2022    9:12 AM 01/01/2022    2:18 PM 12/08/2021    8:53 AM  Fall Risk   Falls in the past year? 0 0 0 0 0  Number falls in past yr: 0 0 0 0 0  Injury with Fall? 0  0 0 0 0  Risk for fall due to : No Fall Risks No Fall Risks No Fall Risks No Fall Risks   Follow up Falls prevention discussed;Falls evaluation completed Falls evaluation completed Falls evaluation completed Falls evaluation completed Falls evaluation completed    FALL RISK PREVENTION PERTAINING TO THE HOME:  Any stairs in or around the home? Yes  If so, are there any without handrails? No  Home free of loose throw rugs in walkways, pet beds, electrical cords, etc? Yes  Adequate lighting in your home to reduce risk of falls? Yes   ASSISTIVE DEVICES UTILIZED TO PREVENT FALLS:  Life alert? No  Use of a cane, walker or w/c? No  Grab bars in the bathroom? Yes  Shower chair or bench in shower? No  Elevated toilet seat or a handicapped toilet? No   Cognitive Function:        01/28/2023    8:27 AM 12/23/2020    9:43 AM 02/27/2019    3:59 PM 02/21/2018    4:36 PM  6CIT Screen  What Year? 0 points 0 points 0 points 0 points  What month? 0 points 0 points 0 points 0 points  What time? 0 points 0 points 0 points 0 points  Count back from 20 0 points 0 points 0 points 0 points  Months in reverse 4 points 4 points 2 points 0 points  Repeat phrase 2 points 2 points 0 points 0 points  Total Score 6 points 6 points 2 points 0 points    Immunizations There is no immunization history for the selected administration types on file for this patient.  TDAP status: Due, Education has been provided regarding the importance of this vaccine. Advised may receive this vaccine at local pharmacy or Health Dept. Aware to provide a copy of the vaccination record if obtained from local pharmacy or Health Dept. Verbalized acceptance and understanding.  Flu Vaccine status: Declined, Education has been provided regarding the importance of this vaccine but patient still declined. Advised may receive this vaccine at local pharmacy or Health Dept. Aware to provide a copy of the vaccination record if obtained from  local pharmacy or Health Dept. Verbalized acceptance and understanding.  Pneumococcal vaccine status: Declined,  Education has been provided regarding the importance of this vaccine but patient still declined. Advised may receive this vaccine at local pharmacy or Health Dept. Aware to provide a copy of the vaccination record if obtained from local pharmacy or Health Dept. Verbalized acceptance and understanding.   Covid-19 vaccine status: Declined, Education has been provided regarding the importance of this vaccine but patient still declined. Advised may receive this vaccine at local pharmacy or Health Dept.or vaccine clinic. Aware to provide a copy of the vaccination record if obtained from local pharmacy or Health Dept. Verbalized acceptance and understanding.  Qualifies for Shingles Vaccine? Yes   Zostavax completed No   Shingrix Completed?: No.    Education has been provided regarding the importance of this vaccine. Patient has been advised to call insurance company to determine out of pocket expense if they have not yet received this vaccine. Advised may also receive vaccine at local pharmacy or Health Dept. Verbalized acceptance and understanding.  Screening Tests Health Maintenance  Topic Date Due   COVID-19 Vaccine (1) Never done   Pneumonia Vaccine 54+ Years old (1 of 2 - PCV) Never done   Hepatitis C Screening  Never done   DTaP/Tdap/Td (1 - Tdap) Never done   Zoster Vaccines- Shingrix (1 of 2) Never done   INFLUENZA VACCINE  03/20/2023 (Originally 07/20/2022)   Lung Cancer Screening  04/26/2023   Medicare Annual Wellness (AWV)  01/29/2024   HPV VACCINES  Aged Out    Health Maintenance  Health Maintenance Due  Topic Date Due   COVID-19 Vaccine (1) Never done   Pneumonia Vaccine 66+ Years old (1 of 2 - PCV) Never done   Hepatitis C Screening  Never done   DTaP/Tdap/Td (1 - Tdap) Never done   Zoster Vaccines- Shingrix (1 of 2) Never done    Colorectal cancer screening: No  longer required.   Lung Cancer Screening: (Low Dose CT Chest recommended if Age 59-80 years, 30 pack-year currently smoking OR have quit w/in 15years.) does not qualify.    Additional Screening:  Hepatitis C Screening: does qualify; Completed no  Vision Screening: Recommended annual ophthalmology exams for early detection of glaucoma and other disorders of the eye. Is the patient up to date with their annual eye exam?  No  Who is the provider or what is the name of the office in which the patient attends annual eye exams? No one If pt is not established with a provider, would they like to be referred to a  provider to establish care? No .   Dental Screening: Recommended annual dental exams for proper oral hygiene  Community Resource Referral / Chronic Care Management: CRR required this visit?  No   CCM required this visit?  No      Plan:     I have personally reviewed and noted the following in the patient's chart:   Medical and social history Use of alcohol, tobacco or illicit drugs  Current medications and supplements including opioid prescriptions. Patient is not currently taking opioid prescriptions. Functional ability and status Nutritional status Physical activity Advanced directives List of other physicians Hospitalizations, surgeries, and ER visits in previous 12 months Vitals Screenings to include cognitive, depression, and falls Referrals and appointments  In addition, I have reviewed and discussed with patient certain preventive protocols, quality metrics, and best practice recommendations. A written personalized care plan for preventive services as well as general preventive health recommendations were provided to patient.     Dionisio David, LPN   [id number]   Nurse Notes: none

## 2023-01-28 NOTE — Patient Instructions (Signed)
Michael Delacruz , Thank you for taking time to come for your Medicare Wellness Visit. I appreciate your ongoing commitment to your health goals. Please review the following plan we discussed and let me know if I can assist you in the future.   These are the goals we discussed:  Goals       DIET - EAT MORE FRUITS AND VEGETABLES      DIET - INCREASE WATER INTAKE      recommend drinking at least 6-9 glasses of water a day       DIET - REDUCE SUGAR INTAKE      Exercise 3x per week (30 min per time)      Walk 2-3 times a week for 20-30 minutes.       Patient Stated      12/23/2020, would like to be more mobile      SW-"I need to take better care of myself." (pt-stated)       Timeframe:  Long-Range Goal Priority:  Medium  Start Date:  02/10/21                         Expected End Date:  08/19/21                    Follow Up Date- 07/28/21  Patient Self Care Activities:  Attend all scheduled provider appointments Call provider office for new concerns or questions Utilize strategies discussed to assist in management of symptoms        This is a list of the screening recommended for you and due dates:  Health Maintenance  Topic Date Due   COVID-19 Vaccine (1) Never done   Pneumonia Vaccine (1 of 2 - PCV) Never done   Hepatitis C Screening: USPSTF Recommendation to screen - Ages 75-79 yo.  Never done   DTaP/Tdap/Td vaccine (1 - Tdap) Never done   Zoster (Shingles) Vaccine (1 of 2) Never done   Flu Shot  03/20/2023*   Screening for Lung Cancer  04/26/2023   Medicare Annual Wellness Visit  01/29/2024   HPV Vaccine  Aged Out  *Topic was postponed. The date shown is not the original due date.    Advanced directives: no  Conditions/risks identified: none  Next appointment: Follow up in one year for your annual wellness visit. 02/03/24 @ 8:15 am by phone  Preventive Care 65 Years and Older, Male  Preventive care refers to lifestyle choices and visits with your health care provider that  can promote health and wellness. What does preventive care include? A yearly physical exam. This is also called an annual well check. Dental exams once or twice a year. Routine eye exams. Ask your health care provider how often you should have your eyes checked. Personal lifestyle choices, including: Daily care of your teeth and gums. Regular physical activity. Eating a healthy diet. Avoiding tobacco and drug use. Limiting alcohol use. Practicing safe sex. Taking low doses of aspirin every day. Taking vitamin and mineral supplements as recommended by your health care provider. What happens during an annual well check? The services and screenings done by your health care provider during your annual well check will depend on your age, overall health, lifestyle risk factors, and family history of disease. Counseling  Your health care provider may ask you questions about your: Alcohol use. Tobacco use. Drug use. Emotional well-being. Home and relationship well-being. Sexual activity. Eating habits. History of falls. Memory and ability to  understand (cognition). Work and work Statistician. Screening  You may have the following tests or measurements: Height, weight, and BMI. Blood pressure. Lipid and cholesterol levels. These may be checked every 5 years, or more frequently if you are over 65 years old. Skin check. Lung cancer screening. You may have this screening every year starting at age 48 if you have a 30-pack-year history of smoking and currently smoke or have quit within the past 15 years. Fecal occult blood test (FOBT) of the stool. You may have this test every year starting at age 66. Flexible sigmoidoscopy or colonoscopy. You may have a sigmoidoscopy every 5 years or a colonoscopy every 10 years starting at age 32. Prostate cancer screening. Recommendations will vary depending on your family history and other risks. Hepatitis C blood test. Hepatitis B blood test. Sexually  transmitted disease (STD) testing. Diabetes screening. This is done by checking your blood sugar (glucose) after you have not eaten for a while (fasting). You may have this done every 1-3 years. Abdominal aortic aneurysm (AAA) screening. You may need this if you are a current or former smoker. Osteoporosis. You may be screened starting at age 22 if you are at high risk. Talk with your health care provider about your test results, treatment options, and if necessary, the need for more tests. Vaccines  Your health care provider may recommend certain vaccines, such as: Influenza vaccine. This is recommended every year. Tetanus, diphtheria, and acellular pertussis (Tdap, Td) vaccine. You may need a Td booster every 10 years. Zoster vaccine. You may need this after age 58. Pneumococcal 13-valent conjugate (PCV13) vaccine. One dose is recommended after age 90. Pneumococcal polysaccharide (PPSV23) vaccine. One dose is recommended after age 19. Talk to your health care provider about which screenings and vaccines you need and how often you need them. This information is not intended to replace advice given to you by your health care provider. Make sure you discuss any questions you have with your health care provider. Document Released: 01/02/2016 Document Revised: 08/25/2016 Document Reviewed: 10/07/2015 Elsevier Interactive Patient Education  2017 [place] Prevention in the Home Falls can cause injuries. They can happen to people of all ages. There are many things you can do to make your home safe and to help prevent falls. What can I do on the outside of my home? Regularly fix the edges of walkways and driveways and fix any cracks. Remove anything that might make you trip as you walk through a door, such as a raised step or threshold. Trim any bushes or trees on the path to your home. Use bright outdoor lighting. Clear any walking paths of anything that might make someone trip, such as  rocks or tools. Regularly check to see if handrails are loose or broken. Make sure that both sides of any steps have handrails. Any raised decks and porches should have guardrails on the edges. Have any leaves, snow, or ice cleared regularly. Use sand or salt on walking paths during winter. Clean up any spills in your garage right away. This includes oil or grease spills. What can I do in the bathroom? Use night lights. Install grab bars by the toilet and in the tub and shower. Do not use towel bars as grab bars. Use non-skid mats or decals in the tub or shower. If you need to sit down in the shower, use a plastic, non-slip stool. Keep the floor dry. Clean up any water that spills on the floor as  soon as it happens. Remove soap buildup in the tub or shower regularly. Attach bath mats securely with double-sided non-slip rug tape. Do not have throw rugs and other things on the floor that can make you trip. What can I do in the bedroom? Use night lights. Make sure that you have a light by your bed that is easy to reach. Do not use any sheets or blankets that are too big for your bed. They should not hang down onto the floor. Have a firm chair that has side arms. You can use this for support while you get dressed. Do not have throw rugs and other things on the floor that can make you trip. What can I do in the kitchen? Clean up any spills right away. Avoid walking on wet floors. Keep items that you use a lot in easy-to-reach places. If you need to reach something above you, use a strong step stool that has a grab bar. Keep electrical cords out of the way. Do not use floor polish or wax that makes floors slippery. If you must use wax, use non-skid floor wax. Do not have throw rugs and other things on the floor that can make you trip. What can I do with my stairs? Do not leave any items on the stairs. Make sure that there are handrails on both sides of the stairs and use them. Fix handrails  that are broken or loose. Make sure that handrails are as long as the stairways. Check any carpeting to make sure that it is firmly attached to the stairs. Fix any carpet that is loose or worn. Avoid having throw rugs at the top or bottom of the stairs. If you do have throw rugs, attach them to the floor with carpet tape. Make sure that you have a light switch at the top of the stairs and the bottom of the stairs. If you do not have them, ask someone to add them for you. What else can I do to help prevent falls? Wear shoes that: Do not have high heels. Have rubber bottoms. Are comfortable and fit you well. Are closed at the toe. Do not wear sandals. If you use a stepladder: Make sure that it is fully opened. Do not climb a closed stepladder. Make sure that both sides of the stepladder are locked into place. Ask someone to hold it for you, if possible. Clearly mark and make sure that you can see: Any grab bars or handrails. First and last steps. Where the edge of each step is. Use tools that help you move around (mobility aids) if they are needed. These include: Canes. Walkers. Scooters. Crutches. Turn on the lights when you go into a dark area. Replace any light bulbs as soon as they burn out. Set up your furniture so you have a clear path. Avoid moving your furniture around. If any of your floors are uneven, fix them. If there are any pets around you, be aware of where they are. Review your medicines with your doctor. Some medicines can make you feel dizzy. This can increase your chance of falling. Ask your doctor what other things that you can do to help prevent falls. This information is not intended to replace advice given to you by your health care provider. Make sure you discuss any questions you have with your health care provider. Document Released: 10/02/2009 Document Revised: 05/13/2016 Document Reviewed: 01/10/2015 Elsevier Interactive Patient Education  2017 [employer].

## 2023-02-08 ENCOUNTER — Ambulatory Visit: Payer: Medicare HMO | Attending: Internal Medicine

## 2023-02-08 DIAGNOSIS — Z950 Presence of cardiac pacemaker: Secondary | ICD-10-CM

## 2023-02-08 DIAGNOSIS — I5020 Unspecified systolic (congestive) heart failure: Secondary | ICD-10-CM | POA: Diagnosis not present

## 2023-02-08 NOTE — Progress Notes (Unsigned)
EPIC Encounter for ICM Monitoring  Patient Name: Michael Delacruz is a 82 y.o. male Date: 02/08/2023 Primary Care Physican: [name], DO Primary Cardiologist: [name] Electrophysiologist: Vergie Living Pacing: 92%            05/04/2022 Office Weight: 178 lbs 08/25/2022 Weight: 172 lbs 10/08/2022 Weight: 170-172 lbs                                                            Transmission reviewed.     Diet:  Does not follow low salt diet and uses salt at home.     CorVue thoracic impedance suggesting normal fluid levels with exception of possible fluid accumulation from 2/4-2/12.   Prescribed: Furosemide 40 mg Take 1 tablet (40 mg total) by mouth daily as needed (As needed for weight gain or shortness of breath).   Recommendations:   No changes   Follow-up plan: ICM clinic phone appointment on 03/14/2023.   91 day device clinic remote transmission 04/26/2023.     EP/Cardiology Office Visits:  Recall 11/19/024 with Dr. [name].   Recall 09/28/2023 with Dr Caryl Comes.    Copy of ICM check sent to Dr. Caryl Comes.    3 month ICM trend: 02/08/2023.    12-14 Month ICM trend:     [name], RN 02/08/2023 8:03 AM

## 2023-03-01 NOTE — Progress Notes (Signed)
Remote pacemaker transmission.   

## 2023-03-14 ENCOUNTER — Ambulatory Visit: Payer: Medicare HMO | Attending: Internal Medicine

## 2023-03-14 DIAGNOSIS — Z950 Presence of cardiac pacemaker: Secondary | ICD-10-CM | POA: Diagnosis not present

## 2023-03-14 DIAGNOSIS — I5020 Unspecified systolic (congestive) heart failure: Secondary | ICD-10-CM

## 2023-03-16 NOTE — Progress Notes (Signed)
EPIC Encounter for ICM Monitoring  Patient Name: Michael Delacruz is a 82 y.o. male Date: 03/16/2023 Primary Care Physican: [name], DO Primary Cardiologist: [name] Electrophysiologist: Vergie Living Pacing: 92%            05/04/2022 Office Weight: 178 lbs 08/25/2022 Weight: 172 lbs 10/08/2022 Weight: 170-172 lbs 03/16/2023 Weight: 175-178 lbs                                                            Spoke with patient and heart failure questions reviewed.  Transmission results reviewed.  Pt asymptomatic for fluid accumulation.  Reports feeling well at this time and voices no complaints.     Diet:  Does not follow low salt diet and uses salt at home.     CorVue thoracic impedance suggesting normal fluid levels with exception of possible fluid accumulation from 3/21-3/23.   Prescribed: Furosemide 40 mg Take 1 tablet (40 mg total) by mouth daily as needed (As needed for weight gain or shortness of breath).   Recommendations:   No changes and encouraged to call if experiencing any fluid symptoms.   Follow-up plan: ICM clinic phone appointment on 04/18/2023.   91 day device clinic remote transmission 04/26/2023.     EP/Cardiology Office Visits:  Recall 11/19/024 with Dr. [name].   Recall 09/28/2023 with Dr Caryl Comes.    Copy of ICM check sent to Dr. Caryl Comes.  3 month ICM trend: 03/14/2023.    12-14 Month ICM trend:     [name], RN 03/16/2023 3:43 PM

## 2023-04-14 ENCOUNTER — Ambulatory Visit: Payer: Self-pay | Admitting: *Deleted

## 2023-04-14 NOTE — Telephone Encounter (Signed)
Will discuss at upcoming appt.

## 2023-04-14 NOTE — Telephone Encounter (Signed)
  Chief Complaint: cough with SOB at times per patient's wife on DPR. Symptoms: cough productive light yellow sputum. SOB with coughing , at rest at times and walking. Congestion, blowing nose and noted some bleeding no clots. Headache forehead pain.  Frequency: couple of days  Pertinent Negatives: Patient denies chest pain no difficulty breathing no fever has not tested for covid Disposition: ED /[] Urgent Care (no appt availability in office) / Appointment(In office/virtual)/  [facility]/ Home Care/ Refused Recommended Disposition /[] [facility]/  Follow-up with PCP Additional Notes:   Appt scheduled for acute visit with  Pamala Hurry, NP. No available appt with PCP until 04/19/23. Recommended hydration, hard candy OTC cough medication if needed. If sx worsen go to UC. Patient's wife on DPR reports patient will not go to UC. Please advise if earlier appt available today     .Reason for Disposition  [1] MILD difficulty breathing (e.g., minimal/no SOB at rest, SOB with walking, pulse <100) AND [2] still present when not coughing  Answer Assessment - Initial Assessment Questions 1. ONSET: "When did the cough begin?"      Couple of days ago  2. SEVERITY: "How bad is the cough today?"      Productive  3. SPUTUM: "Describe the color of your sputum" (none, dry cough; clear, white, yellow, green)     Light yellow  4. HEMOPTYSIS: "Are you coughing up any blood?" If so ask: "How much?" (flecks, streaks, tablespoons, etc.)     Blows nose and noted blood in  mucus. No clots  5. DIFFICULTY BREATHING: "Are you having difficulty breathing?" If Yes, ask: "How bad is it?" (e.g., mild, moderate, severe)    - MILD: No SOB at rest, mild SOB with walking, speaks normally in sentences, can lie down, no retractions, pulse < 100.    - MODERATE: SOB at rest, SOB with minimal exertion and prefers to sit, cannot lie down flat, speaks in phrases, mild retractions, audible wheezing,  pulse 100-120.    - SEVERE: Very SOB at rest, speaks in single words, struggling to breathe, sitting hunched forward, retractions, pulse > 120      Shortness of breath with cough and at rest at times. And when walking  6. FEVER: "Do you have a fever?" If Yes, ask: "What is your temperature, how was it measured, and when did it start?"     No  7. CARDIAC HISTORY: "Do you have any history of heart disease?" (e.g., heart attack, congestive heart failure)      Hx pacemaker  8. LUNG HISTORY: "Do you have any history of lung disease?"  (e.g., pulmonary embolus, asthma, emphysema)     na 9. PE RISK FACTORS: "Do you have a history of blood clots?" (or: recent major surgery, recent prolonged travel, bedridden)     na 10. OTHER SYMPTOMS: "Do you have any other symptoms?" (e.g., runny nose, wheezing, chest pain)       Runny nose cough with SOB and headache forehead pain congestion 11. PREGNANCY: "Is there any chance you are pregnant?" "When was your last menstrual period?"       na 12. TRAVEL: "Have you traveled out of the country in the last month?" (e.g., travel history, exposures)       na  Protocols used: Cough - Acute Productive-A-AH

## 2023-04-15 ENCOUNTER — Ambulatory Visit (INDEPENDENT_AMBULATORY_CARE_PROVIDER_SITE_OTHER): Payer: Medicare HMO | Admitting: Internal Medicine

## 2023-04-15 ENCOUNTER — Encounter: Payer: Self-pay | Admitting: Internal Medicine

## 2023-04-15 VITALS — BP 138/84 | HR 83 | Temp 97.5°F | Wt 185.0 lb

## 2023-04-15 DIAGNOSIS — J441 Chronic obstructive pulmonary disease with (acute) exacerbation: Secondary | ICD-10-CM

## 2023-04-15 DIAGNOSIS — B9789 Other viral agents as the cause of diseases classified elsewhere: Secondary | ICD-10-CM | POA: Diagnosis not present

## 2023-04-15 DIAGNOSIS — J329 Chronic sinusitis, unspecified: Secondary | ICD-10-CM

## 2023-04-15 MED ORDER — PREDNISONE 10 MG PO TABS: ORAL_TABLET | ORAL | 0 refills | Status: DC

## 2023-04-15 MED ORDER — BENZONATATE 200 MG PO CAPS: 200.0000 mg | ORAL_CAPSULE | Freq: Three times a day (TID) | ORAL | 0 refills | Status: DC | PRN

## 2023-04-15 MED ORDER — ALBUTEROL SULFATE HFA 108 (90 BASE) MCG/ACT IN AERS: 2.0000 | INHALATION_SPRAY | Freq: Four times a day (QID) | RESPIRATORY_TRACT | 0 refills | Status: DC | PRN

## 2023-04-15 NOTE — Patient Instructions (Signed)
Sinus Pain  Sinus pain may occur when your sinuses become clogged or swollen. Sinuses are air-filled spaces in your skull that are behind the bones of your face and forehead. Sinus pain can range from mild to severe. What are the causes? Sinus pain can result from various conditions that affect the sinuses. Common causes include: Colds. Sinus infections. Allergies. What are the signs or symptoms? The main symptom of this condition is pain or pressure in your face, forehead, ears, or upper teeth. People who have sinus pain often have other symptoms, such as: Congested or runny nose. Fever. Inability to smell. Headache. Weather changes can make symptoms worse. How is this diagnosed? Your health care provider will diagnose this condition based on your symptoms and a physical exam. If you have pain that keeps coming back or does not go away, your health care provider may recommend more testing. This may include: Imaging tests, such as a CT scan or MRI, to check for problems with your sinuses. Examination of your sinuses using a thin tool with a camera that is inserted through your nose (endoscopy). How is this treated? Treatment for this condition depends on the cause. Sinus pain that is caused by a sinus infection may be treated with antibiotic medicine. Sinus pain that is caused by congestion may be helped by rinsing out (flushing) the nose and sinuses with saline solution. Sinus pain that is caused by allergies may be helped by allergy medicines (antihistamines) and medicated nasal sprays. Sinus surgery may be needed in some cases if other treatments do not help. Follow these instructions at home: General instructions If directed: Apply a warm, moist washcloth to your face to help relieve pain. Use a nasal saline wash. Follow the directions on the bottle or box. Hydrate and humidify Drink enough water to keep your urine clear or pale yellow. Staying hydrated will help to thin your  mucus. Use a humidifier if your home is dry. Inhale steam for 10-15 minutes, 3-4 times a day or as told by your health care provider. You can do this in the bathroom while a hot shower is running. Limit your exposure to cool or dry air. Medicines  Take over-the-counter and prescription medicines only as told by your health care provider. If you were prescribed an antibiotic medicine, take it as told by your health care provider. Do not stop taking the antibiotic even if you start to feel better. If you have congestion, use a nasal spray to help lessen pressure. Contact a health care provider if: You have sinus pain more than one time a week. You have sensitivity to light or sound. You develop a fever. You feel nauseous or you vomit. Your sinus pain or headache does not get better with treatment. Get help right away if: You have vision problems. You have sudden, severe pain in your face or head. You have a seizure. You are confused. You have a stiff neck. Summary Sinus pain occurs when your sinuses become clogged or swollen. Sinus pain can result from various conditions that affect the sinuses, such as a cold, a sinus infection, or an allergy. Treatment for this condition depends on the cause. It may include medicine, such as antibiotics or antihistamines. This information is not intended to replace advice given to you by your health care provider. Make sure you discuss any questions you have with your health care provider. Document Revised: 11/08/2021 Document Reviewed: 11/08/2021 Elsevier Patient Education  2023 [employer]  

## 2023-04-15 NOTE — Progress Notes (Signed)
HPI  Pt presents to the clinic today with c/o headache, sinus pressure, runny nose, nasal congestion, ear pain, cough and shortness of breath.  This started 3 days ago.  The headache is located in his forehead.  He is blowing mostly clear mucus out of his nose.  The cough is productive of mostly clear mucus.  He denies sore throat, nausea, vomiting or diarrhea.  He denies fever, chills or bodyaches.  He has a history of COPD, CHF, pulmonary hypertension and CAD status post TIA.  He is not currently using any inhalers.  PFTs from 09/2015 reviewed.  He no longer smokes.  He has not had sick contacts that he is aware of.  Review of Systems      Past Medical History:  Diagnosis Date   Anxiety    Arthritis    Ascending aortic aneurysm (HCC)    a. 11/2018 Echo: Ao root 43mm, Asc Ao 41mm; b. 02/2021 Echo: Ao root 47mm; c. 04/2021 CT chest: Asc Ao 4.5cm.   Asthma    Bradycardia    CAD in native artery    a. LHC 09/2015: 40% pCx, 35% mRCA.   Chronic HFrEF (heart failure with reduced ejection fraction) (HCC)    COPD (chronic obstructive pulmonary disease) (HCC)    Dilation of intestine 01/2015   Fibromyalgia    Frequent headaches    GERD (gastroesophageal reflux disease)    History of blood clots    eye    History of hiatal hernia    History of kidney stones    History of rheumatic fever    Hypertension    NICM (nonischemic cardiomyopathy) (HCC)    a. 07/2017 Echo: EF reduced to 40-45%; b. 07/2018 Echo: EF 25-30%; c. 11/2018 Echo: EF 30-35%; d. 02/2021 Echo: EF 45-50%, septal and apical septal HK. Mild MR. Sev dil LA. Mod dil RA. Ao root 47mm.   Permanent atrial fibrillation (HCC)    a. s/p TEE/DCCV 07/2015. b. H/o bleeding on Coumadin when INR >5, changed to Eliquis-subsequently discontinued; c. 04/2018 s/p SJM 3562 Quadra Allure MP DC PPM & AVN ablation.   S/P Minimally invasive maze operation for atrial fibrillation 10/22/2015   Complete bilateral atrial lesion set using cryothermy and bipolar  radiofrequency ablation with clipping of LA appendage via right mini thoracotomy approach   S/P minimally invasive mitral valve repair 10/22/2015   Complex valvuloplasty including triangular resection of posterior leaflet, artificial Gore-tex neochord placement x6 and 38 mm Sorin Memo 3D Rechord ring annuloplasty via right minithoracotomy approach   Severe mitral regurgitation s/p MVR    a. s/p MV repair 10/2015; b. 11/2018 Echo: Mild MS (mean grad ); d. 02/2021 Echo: Mild MR. Mean grad .   Sleep apnea    no longer uses cpap and doesn't use O2 at home   Stroke [facility])     no vision in right, on occasion sees a pinpoint    Thyroid disorder    TIA (transient ischemic attack)    Tobacco abuse     Family History  Problem Relation Age of Onset   Stroke Mother    Irregular heart beat Mother    Heart murmur Brother    Pulmonary embolism Brother    Hypertension Other    Pulmonary embolism Maternal Uncle     Social History   Socioeconomic History   Marital status: Married    Spouse name: [name]   Number of children: 3   Years of education: GED   Highest education level:  GED or equivalent  Occupational History   Not on file  Tobacco Use   Smoking status: Every Day    Packs/day: 0.50    Years: 60.00    Additional pack years: 0.00    Total pack years: 30.00    Types: Cigarettes    Start date: 67   Smokeless tobacco: Never   Tobacco comments:    Resumed smoking, after quit 01/2018  Vaping Use   Vaping Use: Never used  Substance and Sexual Activity   Alcohol use: Not Currently    Alcohol/week: 1.0 standard drink of alcohol    Types: 1 Standard drinks or equivalent per week    Comment: occasionally drinks a margarita   Drug use: No   Sexual activity: Not on file  Other Topics Concern   Not on file  Social History Narrative   Right handed    2-3 cups coffee per day         Social Determinants of Health   Financial Resource Strain: Low Risk  (01/28/2023)    Overall Financial Resource Strain (CARDIA)    Difficulty of Paying Living Expenses: Not very hard  Food Insecurity: No Food Insecurity (01/28/2023)   Hunger Vital Sign    Worried About Running Out of Food in the Last Year: Never true    Ran Out of Food in the Last Year: Never true  Transportation Needs: No Transportation Needs (01/28/2023)   PRAPARE - Administrator, Civil Service (Medical): No    Lack of Transportation (Non-Medical): No  Physical Activity: Inactive (01/28/2023)   Exercise Vital Sign    Days of Exercise per Week: 0 days    Minutes of Exercise per Session: 0 min  Stress: No Stress Concern Present (01/28/2023)   [employer] of Occupational Health - Occupational Stress Questionnaire    Feeling of Stress : Only a little  Social Connections: Moderately Isolated (01/28/2023)   Social Connection and Isolation Panel [NHANES]    Frequency of Communication with Friends and Family: Twice a week    Frequency of Social Gatherings with Friends and Family: More than three times a week    Attends Religious Services: Never    Database administrator or Organizations: No    Attends Banker Meetings: Never    Marital Status: Married  Catering manager Violence: Not At Risk (01/28/2023)   Humiliation, Afraid, Rape, and Kick questionnaire    Fear of Current or Ex-Partner: No    Emotionally Abused: No    Physically Abused: No    Sexually Abused: No    Allergies  Allergen Reactions   Codeine Nausea Only   Digoxin And Related     SOB, bad dreams   Macrodantin [Nitrofurantoin Macrocrystal] Rash   Morphine And Related Rash     Constitutional: Positive headache. Denies fever or abrupt weight changes.  HEENT:  Positive runny nose, nasal congestion, ear pain and sinus pressure. Denies eye redness, eye pain, pressure behind the eyes, ringing in the ears, wax buildup, or sore throat. Respiratory: Positive cough and shortness of breath. Denies difficulty breathing.   Cardiovascular: Denies chest pain, chest tightness, palpitations or swelling in the hands or feet.   No other specific complaints in a complete review of systems (except as listed in HPI above).  Objective:   BP 138/84 (BP Location: Left Arm, Patient Position: Sitting, Cuff Size: Normal)   Pulse 83   Temp (!) 97.5 F (36.4 C) (Temporal)   Wt 185 lb (  83.9 kg)   SpO2 99%   BMI 24.41 kg/m   Wt Readings from Last 3 Encounters:  01/28/23 174 lb (78.9 kg)  11/08/22 174 lb (78.9 kg)  10/25/22 172 lb (78 kg)     General: Appears his stated age, chronically ill-appearing, in NAD. HEENT: Head: normal shape and size, maxillary sinus pressure noted; Eyes: sclera white, no icterus, conjunctiva pink; Ears: Tm's gray and intact, normal light reflex; Nose: mucosa pink and moist, septum midline; Throat/Mouth: + PND. Teeth present, mucosa erythematous and moist, no exudate noted, no lesions or ulcerations noted.  Neck: No cervical lymphadenopathy.  Cardiovascular: Normal rate and rhythm. S1,S2 noted.  No murmur, rubs or gallops noted.  Pulmonary/Chest: Normal effort and positive vesicular breath sounds with intermittent expiratory wheeze noted. No respiratory distress. No rales or ronchi noted.       Assessment & Plan:   Viral Sinusitis, COPD Exacerbation:  Get some rest and drink plenty of water No indication for antibiotic at this time Rx for Pred taper for symptom management Rx for Tessalon 200 mg every 8 hours as needed Rx for albuterol inhaler 1 to 2 puffs every 4-6 hours as needed  Follow-up with your PCP as previously scheduled   Nicki Reaper, NP

## 2023-04-18 ENCOUNTER — Ambulatory Visit: Payer: Medicare HMO | Attending: Internal Medicine

## 2023-04-18 DIAGNOSIS — Z950 Presence of cardiac pacemaker: Secondary | ICD-10-CM | POA: Diagnosis not present

## 2023-04-18 DIAGNOSIS — I5020 Unspecified systolic (congestive) heart failure: Secondary | ICD-10-CM | POA: Diagnosis not present

## 2023-04-19 NOTE — Progress Notes (Signed)
EPIC Encounter for ICM Monitoring  Patient Name: Michael Delacruz is a 82 y.o. male Date: 04/19/2023 Primary Care Physican: Smitty Cords, DO Primary Cardiologist: Mariah Milling Electrophysiologist: Joycelyn Schmid Pacing: 92%            05/04/2022 Office Weight: 178 lbs 08/25/2022 Weight: 172 lbs 10/08/2022 Weight: 170-172 lbs 03/16/2023 Weight: 175-178 lbs                                                            Spoke with patient and heart failure questions reviewed.  Transmission results reviewed.  Pt is taking prednisone and inhalers for respiratory exacerbation. He has increased fluid intake for past 2 weeks to due respiratory problems.  He can tell he has some fluid accumulation.   Diet:  Does not follow low salt diet and uses salt at home.     CorVue thoracic impedance suggesting possible fluid accumulation starting 4/19 and trending back close to baseline.  Also suggesting possible fluid accumulation from 3/30-4/9.   Prescribed: Furosemide 40 mg Take 1 tablet (40 mg total) by mouth daily as needed (As needed for weight gain or shortness of breath).   Recommendations:  Advised to take 20 mg Furosemide x 1 day.    Encouraged to call if experiencing any fluid symptoms.   Follow-up plan: ICM clinic phone appointment on 05/23/2023.   91 day device clinic remote transmission 04/26/2023.     EP/Cardiology Office Visits:  Recall 11/19/024 with Dr. Mariah Milling.   Recall 09/28/2023 with Dr Graciela Husbands.    Copy of ICM check sent to Dr. Graciela Husbands.   3 month ICM trend: 04/18/2023.    12-14 Month ICM trend:     Karie Soda, RN 04/19/2023 10:26 AM

## 2023-04-26 ENCOUNTER — Ambulatory Visit (INDEPENDENT_AMBULATORY_CARE_PROVIDER_SITE_OTHER): Payer: Medicare HMO

## 2023-04-26 DIAGNOSIS — I428 Other cardiomyopathies: Secondary | ICD-10-CM

## 2023-04-26 LAB — CUP PACEART REMOTE DEVICE CHECK
Battery Remaining Longevity: 41 mo
Battery Remaining Percentage: 47 %
Battery Voltage: 2.98 V
Date Time Interrogation Session: 20240507020858
Implantable Lead Connection Status: 753985
Implantable Lead Connection Status: 753985
Implantable Lead Implant Date: 20190515
Implantable Lead Implant Date: 20190515
Implantable Lead Location: 753858
Implantable Lead Location: 753860
Implantable Lead Model: 5076
Implantable Pulse Generator Implant Date: 20190515
Lead Channel Impedance Value: 540 Ohm
Lead Channel Impedance Value: 580 Ohm
Lead Channel Pacing Threshold Amplitude: 0.5 V
Lead Channel Pacing Threshold Amplitude: 0.75 V
Lead Channel Pacing Threshold Pulse Width: 0.5 ms
Lead Channel Pacing Threshold Pulse Width: 0.5 ms
Lead Channel Sensing Intrinsic Amplitude: 11.2 mV
Lead Channel Setting Pacing Amplitude: 2 V
Lead Channel Setting Pacing Amplitude: 2.5 V
Lead Channel Setting Pacing Pulse Width: 0.5 ms
Lead Channel Setting Pacing Pulse Width: 0.5 ms
Lead Channel Setting Sensing Sensitivity: 2 mV
Pulse Gen Model: 3562
Pulse Gen Serial Number: 9427031

## 2023-05-18 NOTE — Progress Notes (Signed)
Remote pacemaker transmission.   

## 2023-05-23 ENCOUNTER — Ambulatory Visit: Payer: Medicare HMO | Attending: Internal Medicine

## 2023-05-23 DIAGNOSIS — I5020 Unspecified systolic (congestive) heart failure: Secondary | ICD-10-CM | POA: Diagnosis not present

## 2023-05-23 DIAGNOSIS — Z950 Presence of cardiac pacemaker: Secondary | ICD-10-CM

## 2023-05-24 ENCOUNTER — Telehealth: Payer: Self-pay

## 2023-05-24 NOTE — Progress Notes (Signed)
EPIC Encounter for ICM Monitoring  Patient Name: Michael Delacruz is a 82 y.o. male Date: 05/24/2023 Primary Care Physican: Smitty Cords, DO Primary Cardiologist: Mariah Milling Electrophysiologist: Joycelyn Schmid Pacing: 93%            05/04/2022 Office Weight: 178 lbs 08/25/2022 Weight: 172 lbs 10/08/2022 Weight: 170-172 lbs 03/16/2023 Weight: 175-178 lbs                                                            Attempted call to patient and unable to reach.  Left detailed message per DPR regarding transmission. Transmission reviewed.    Diet:  Does not follow low salt diet and uses salt at home.     CorVue thoracic impedance suggesting possible fluid accumulation starting 5/27 and trending back close to baseline.   Prescribed: Furosemide 40 mg Take 1 tablet (40 mg total) by mouth daily as needed (As needed for weight gain or shortness of breath).   Recommendations:  Left voice mail with ICM number and encouraged to call if experiencing any fluid symptoms.   Follow-up plan: ICM clinic phone appointment on 06/27/2023.   91 day device clinic remote transmission 07/26/2023.     EP/Cardiology Office Visits:  Recall 11/19/024 with Dr. Mariah Milling.   Recall 09/28/2023 with Dr Graciela Husbands.    Copy of ICM check sent to Dr. Graciela Husbands.    3 month ICM trend: 05/23/2023.    12-14 Month ICM trend:     Karie Soda, RN 05/24/2023 8:49 AM

## 2023-05-24 NOTE — Telephone Encounter (Signed)
Remote ICM transmission received.  Attempted call to patient regarding ICM remote transmission and left detailed message per DPR.  Left ICM phone number and advised to return call for any fluid symptoms or questions. Next ICM remote transmission scheduled 06/27/2023.    

## 2023-06-27 ENCOUNTER — Ambulatory Visit: Payer: Medicare HMO

## 2023-06-27 DIAGNOSIS — I5020 Unspecified systolic (congestive) heart failure: Secondary | ICD-10-CM

## 2023-06-27 DIAGNOSIS — Z950 Presence of cardiac pacemaker: Secondary | ICD-10-CM

## 2023-06-27 NOTE — Progress Notes (Signed)
EPIC Encounter for ICM Monitoring  Patient Name: Michael Delacruz is a 82 y.o. male Date: 06/27/2023 Primary Care Physican: Smitty Cords, DO Primary Cardiologist: Mariah Milling Electrophysiologist: Joycelyn Schmid Pacing: 94%            03/16/2023 Weight: 175-178 lbs 06/27/2023 Weight: 174 lbs                                                            Spoke with patient and heart failure questions reviewed.  Transmission results reviewed.  Pt's weight fluctuates 4-5 lbs in a week and highest in the past week was 178 lbs.  He has taken PRN Furosemide 2-3 times in the last month which may correlate with possible dryness.     Diet:  Does not follow low salt diet and uses salt at home.     CorVue thoracic impedance suggesting possible fluid accumulation starting 7/3.  Also suggesting possible fluid accumulation from 6/18-6/24 and possible dryness from 6/11-6/16 and 6/25-7/1.   Prescribed: Furosemide 40 mg Take 1 tablet (40 mg total) by mouth daily as needed (As needed for weight gain or shortness of breath).   Recommendations:  Advised to Take 20 mg Furosemide x 2 days and then return to PRN Furosemide as prescribed.      Follow-up plan: ICM clinic phone appointment on 07/04/2023 to recheck fluid levels.   91 day device clinic remote transmission 07/26/2023.     EP/Cardiology Office Visits:  Recall 11/19/024 with Dr. Mariah Milling.   Recall 09/28/2023 with Dr Graciela Husbands.    Copy of ICM check sent to Dr. Graciela Husbands and Dr Mariah Milling as Lorain Childes.    3 month ICM trend: 06/27/2023.    12-14 Month ICM trend:     Karie Soda, RN 06/27/2023 3:57 PM

## 2023-07-04 ENCOUNTER — Ambulatory Visit: Payer: Medicare HMO | Attending: Internal Medicine

## 2023-07-04 DIAGNOSIS — I5020 Unspecified systolic (congestive) heart failure: Secondary | ICD-10-CM

## 2023-07-04 DIAGNOSIS — Z950 Presence of cardiac pacemaker: Secondary | ICD-10-CM

## 2023-07-06 ENCOUNTER — Telehealth: Payer: Self-pay

## 2023-07-06 NOTE — Telephone Encounter (Signed)
Remote ICM transmission received.  Attempted call to patient regarding ICM remote transmission and left detailed message per DPR.  Left ICM phone number and advised to return call for any fluid symptoms or questions. Next ICM remote transmission scheduled 08/01/2023.    

## 2023-07-06 NOTE — Progress Notes (Signed)
EPIC Encounter for ICM Monitoring  Patient Name: Michael Delacruz is a 82 y.o. male Date: 07/06/2023 Primary Care Physican: Smitty Cords, DO Primary Cardiologist: Mariah Milling Electrophysiologist: Joycelyn Schmid Pacing: 94%            03/16/2023 Weight: 175-178 lbs 06/27/2023 Weight: 174 lbs                                                            Attempted call to patient and unable to reach.  Left detailed message per DPR regarding transmission. Transmission reviewed.      Diet:  Does not follow low salt diet and uses salt at home.     CorVue thoracic impedance suggesting fluid levels returned to normal after recommendation to take PRN Furosemide x 2 days     Prescribed: Furosemide 40 mg Take 1 tablet (40 mg total) by mouth daily as needed (As needed for weight gain or shortness of breath).   Recommendations:  Left voice mail with ICM number and encouraged to call if experiencing any fluid symptoms.   Follow-up plan: ICM clinic phone appointment on 08/01/2023.   91 day device clinic remote transmission 07/26/2023.     EP/Cardiology Office Visits:  Recall 11/19/024 with Dr. Mariah Milling.   Recall 09/28/2023 with Dr Graciela Husbands.    Copy of ICM check sent to Dr. Graciela Husbands  3 month ICM trend: 07/04/2023.    12-14 Month ICM trend:     Karie Soda, RN 07/06/2023 12:54 PM

## 2023-07-26 ENCOUNTER — Ambulatory Visit (INDEPENDENT_AMBULATORY_CARE_PROVIDER_SITE_OTHER): Payer: Medicare HMO

## 2023-07-26 DIAGNOSIS — I4821 Permanent atrial fibrillation: Secondary | ICD-10-CM

## 2023-08-01 ENCOUNTER — Ambulatory Visit: Payer: Medicare HMO | Attending: Internal Medicine

## 2023-08-01 DIAGNOSIS — Z950 Presence of cardiac pacemaker: Secondary | ICD-10-CM | POA: Diagnosis not present

## 2023-08-01 DIAGNOSIS — I5020 Unspecified systolic (congestive) heart failure: Secondary | ICD-10-CM

## 2023-08-03 ENCOUNTER — Telehealth: Payer: Self-pay

## 2023-08-03 NOTE — Telephone Encounter (Signed)
Remote ICM transmission received.  Attempted call to patient regarding ICM remote transmission and left detailed message per DPR.  Left ICM phone number and advised to return call for any fluid symptoms or questions. Next ICM remote transmission scheduled 09/05/2023.

## 2023-08-03 NOTE — Progress Notes (Signed)
EPIC Encounter for ICM Monitoring  Patient Name: EMRAAN SWOR is a 82 y.o. male Date: 08/03/2023 Primary Care Physican: Smitty Cords, DO Primary Cardiologist: Mariah Milling Electrophysiologist: Joycelyn Schmid Pacing: 94%            03/16/2023 Weight: 175-178 lbs 06/27/2023 Weight: 174 lbs                                                            Attempted call to patient and unable to reach.  Left detailed message per DPR regarding transmission. Transmission reviewed.      Diet:  Does not follow low salt diet and uses salt at home.     CorVue thoracic impedance suggesting intermittent days with possible fluid accumulation within the last month.      Prescribed: Furosemide 40 mg Take 1 tablet (40 mg total) by mouth daily as needed (As needed for weight gain or shortness of breath).   Recommendations:  Left voice mail with ICM number and encouraged to call if experiencing any fluid symptoms.   Follow-up plan: ICM clinic phone appointment on 09/05/2023.   91 day device clinic remote transmission 07/26/2023.     EP/Cardiology Office Visits: 11/19/024 with Dr. Mariah Milling.  10/04/2023 with Dr Graciela Husbands.    Copy of ICM check sent to Dr. Graciela Husbands.   3 month ICM trend: 08/01/2023.    12-14 Month ICM trend:     Karie Soda, RN 08/03/2023 2:42 PM

## 2023-08-11 NOTE — Progress Notes (Signed)
Remote pacemaker transmission.   

## 2023-09-05 ENCOUNTER — Ambulatory Visit: Payer: Medicare HMO | Attending: Internal Medicine

## 2023-09-05 DIAGNOSIS — I5020 Unspecified systolic (congestive) heart failure: Secondary | ICD-10-CM

## 2023-09-05 DIAGNOSIS — Z950 Presence of cardiac pacemaker: Secondary | ICD-10-CM | POA: Diagnosis not present

## 2023-09-07 ENCOUNTER — Telehealth: Payer: Self-pay

## 2023-09-07 NOTE — Telephone Encounter (Signed)
Remote ICM transmission received.  Attempted call to patient regarding ICM remote transmission and no answer.

## 2023-09-07 NOTE — Progress Notes (Signed)
EPIC Encounter for ICM Monitoring  Patient Name: Michael Delacruz is a 82 y.o. male Date: 09/07/2023 Primary Care Physican: Smitty Cords, DO Primary Cardiologist: Mariah Milling Electrophysiologist: Joycelyn Schmid Pacing: 94%            03/16/2023 Weight: 175-178 lbs 06/27/2023 Weight: 174 lbs                                                            Attempted call to patient and unable to reach.  Transmission reviewed.    Diet:  Does not follow low salt diet and uses salt at home.     CorVue thoracic impedance suggesting possible fluid accumulation from 8/19-8/27 and 9/9-9/13 within the last month.      Prescribed: Furosemide 40 mg Take 1 tablet (40 mg total) by mouth daily as needed (As needed for weight gain or shortness of breath).   Recommendations:  Unable to reach.     Follow-up plan: ICM clinic phone appointment on 10/10/2023.   91 day device clinic remote transmission 10/25/2023.     EP/Cardiology Office Visits: 11/19/024 with Dr. Mariah Milling.  10/04/2023 with Dr Graciela Husbands.    Copy of ICM check sent to Dr. Graciela Husbands.   3 month ICM trend: 09/05/2023.    12-14 Month ICM trend:     Karie Soda, RN 09/07/2023 2:15 PM

## 2023-10-03 DIAGNOSIS — Z950 Presence of cardiac pacemaker: Secondary | ICD-10-CM | POA: Insufficient documentation

## 2023-10-03 DIAGNOSIS — I4892 Unspecified atrial flutter: Secondary | ICD-10-CM | POA: Insufficient documentation

## 2023-10-04 ENCOUNTER — Ambulatory Visit: Payer: Medicare HMO | Admitting: Internal Medicine

## 2023-10-04 DIAGNOSIS — I5022 Chronic systolic (congestive) heart failure: Secondary | ICD-10-CM

## 2023-10-04 DIAGNOSIS — I4892 Unspecified atrial flutter: Secondary | ICD-10-CM

## 2023-10-04 DIAGNOSIS — I442 Atrioventricular block, complete: Secondary | ICD-10-CM

## 2023-10-04 DIAGNOSIS — Z950 Presence of cardiac pacemaker: Secondary | ICD-10-CM

## 2023-10-10 ENCOUNTER — Ambulatory Visit: Payer: Medicare HMO | Attending: Internal Medicine

## 2023-10-10 DIAGNOSIS — I5020 Unspecified systolic (congestive) heart failure: Secondary | ICD-10-CM | POA: Diagnosis not present

## 2023-10-10 DIAGNOSIS — Z950 Presence of cardiac pacemaker: Secondary | ICD-10-CM | POA: Diagnosis not present

## 2023-10-12 NOTE — Progress Notes (Signed)
EPIC Encounter for ICM Monitoring  Patient Name: Michael Delacruz is a 82 y.o. male Date: 10/12/2023 Primary Care Physican: Smitty Cords, DO Primary Cardiologist: Mariah Milling Electrophysiologist: Joycelyn Schmid Pacing: 94%            03/16/2023 Weight: 175-178 lbs 06/27/2023 Weight: 174 lbs                                                            Transmission reviewed.    Diet:  Does not follow low salt diet and uses salt at home.     CorVue thoracic impedance suggesting possible fluid accumulation from 9/24-10/10.      Prescribed: Furosemide 40 mg Take 1 tablet (40 mg total) by mouth daily as needed (As needed for weight gain or shortness of breath).   Recommendations:  No changes.     Follow-up plan: ICM clinic phone appointment on 11/21/2023.   91 day device clinic remote transmission 10/25/2023.     EP/Cardiology Office Visits: 11/19/024 with Dr. Mariah Milling.  10/25/2023 with Dr Graciela Husbands.    Copy of ICM check sent to Dr. Graciela Husbands.   3 month ICM trend: 10/10/2023.    12-14 Month ICM trend:     Karie Soda, RN 10/12/2023 1:58 PM

## 2023-10-13 DIAGNOSIS — H5202 Hypermetropia, left eye: Secondary | ICD-10-CM | POA: Diagnosis not present

## 2023-10-25 ENCOUNTER — Encounter: Payer: Self-pay | Admitting: Internal Medicine

## 2023-10-25 ENCOUNTER — Ambulatory Visit (INDEPENDENT_AMBULATORY_CARE_PROVIDER_SITE_OTHER): Payer: Medicare HMO

## 2023-10-25 ENCOUNTER — Ambulatory Visit: Payer: Medicare HMO | Attending: Internal Medicine | Admitting: Internal Medicine

## 2023-10-25 VITALS — BP 115/66 | HR 80 | Ht 72.0 in | Wt 183.2 lb

## 2023-10-25 DIAGNOSIS — I4821 Permanent atrial fibrillation: Secondary | ICD-10-CM | POA: Diagnosis not present

## 2023-10-25 DIAGNOSIS — I5020 Unspecified systolic (congestive) heart failure: Secondary | ICD-10-CM

## 2023-10-25 LAB — CUP PACEART REMOTE DEVICE CHECK
Battery Remaining Longevity: 36 mo
Battery Remaining Percentage: 42 %
Battery Voltage: 2.96 V
Date Time Interrogation Session: 20241105020009
Implantable Lead Connection Status: 753985
Implantable Lead Connection Status: 753985
Implantable Lead Implant Date: 20190515
Implantable Lead Implant Date: 20190515
Implantable Lead Location: 753858
Implantable Lead Location: 753860
Implantable Lead Model: 5076
Implantable Pulse Generator Implant Date: 20190515
Lead Channel Impedance Value: 540 Ohm
Lead Channel Impedance Value: 590 Ohm
Lead Channel Pacing Threshold Amplitude: 0.5 V
Lead Channel Pacing Threshold Amplitude: 0.75 V
Lead Channel Pacing Threshold Pulse Width: 0.5 ms
Lead Channel Pacing Threshold Pulse Width: 0.5 ms
Lead Channel Sensing Intrinsic Amplitude: 12 mV
Lead Channel Setting Pacing Amplitude: 2 V
Lead Channel Setting Pacing Amplitude: 2.5 V
Lead Channel Setting Pacing Pulse Width: 0.5 ms
Lead Channel Setting Pacing Pulse Width: 0.5 ms
Lead Channel Setting Sensing Sensitivity: 2 mV
Pulse Gen Model: 3562
Pulse Gen Serial Number: 9427031

## 2023-10-25 NOTE — Patient Instructions (Addendum)
Medication Instructions:  No changes *If you need a refill on your cardiac medications before your next appointment, please call your pharmacy*   Lab Work: Your provider would like for you to have following labs drawn today BMET and CBC.   If you have labs (blood work) drawn today and your tests are completely normal, you will receive your results only by: MyChart Message (if you have MyChart) OR A paper copy in the mail If you have any lab test that is abnormal or we need to change your treatment, we will call you to review the results.   Testing/Procedures: Dr. Graciela Husbands has ordered a Head CT for you. We will look in to patient assistance for this test and call you with a plan   Follow-Up: At [facility], you and your health needs are our priority.  As part of our continuing mission to provide you with exceptional heart care, we have created designated Provider Care Teams.  These Care Teams include your primary Cardiologist (physician) and Advanced Practice Providers (APPs -  Physician Assistants and Nurse Practitioners) who all work together to provide you with the care you need, when you need it.  We recommend signing up for the patient portal called "MyChart".  Sign up information is provided on this After Visit Summary.  MyChart is used to connect with patients for Virtual Visits (Telemedicine).  Patients are able to view lab/test results, encounter notes, upcoming appointments, etc.  Non-urgent messages can be sent to your provider as well.   To learn more about what you can do with MyChart, go to [email or web address].    Your next appointment:   4 months with Dr. Mariah Milling 12 months with Dr. Graciela Husbands Other Instructions Speak with your wife about starting an anticoagulation and call us back at [phone]

## 2023-10-25 NOTE — Progress Notes (Signed)
Patient Care Team: Smitty Cords, DO as PCP - General (Family Medicine) Antonieta Iba, MD as PCP - Cardiology (Cardiology) Mariah Milling Tollie Pizza, MD as Consulting Physician (Cardiology) Bridgett Larsson, LCSW as Social Worker (Licensed Clinical Social Worker)   HPI  Michael Delacruz is a 82 y.o. male seen in follow-up for persistent/permanent atrial fibrillation/flutter with a rapid ventricular response and interval deterioration of LV systolic function He remains disinclined for anticoagulation.  Atrial fibrillation is now permanent 5/19 he presented with acute systolic heart failure with symptoms of impending doom.  He was in atrial flutter with uncontrolled rate.  He was at that juncture willing and underwent CRT-P and AV junction ablation.  I did his CRT-P Dr. Fawn Kirk did his AV ablation  Interval stroke.  Open to anticoagulation.  Headaches worse in the morning now for 3 or 4 months getting worse  Short of breath with modest exertion, no edema or chest pain.  Depressed, denies active suicidality.  Social issues money issues family loss and impending loss DATE TEST EF  AoRoot    8/16    Echo  55-60 %  Severe MR 2/2 post leaflet prolapse  12/16    Echo  55-60 %  Mild Aortic root dilitation MR mild   8/18 Echo  45-50%     5/19 Echo  20-25%    8/19 Echo  25-30%    5/22 CTA  45 mm   4/23 Echo  45*-50% 45 mm LA 31 mm/ 56 ml/m2         DATE TEST   9/18 Holter      Mean 123 (94-135)  12/18 Holter Mean HR 112 (94-135)          Date Cr K TSH ALT Hgb   10/18 1.23 3.4 5.31(8/18)  12   10/18 1..44 3.9 9.9>>8.7    5/19 0.97 3.9        1.03    5/23 1.29 4.0   14.2     Past Medical History:  Diagnosis Date   Anxiety    Arthritis    Ascending aortic aneurysm (HCC)    a. 11/2018 Echo: Ao root 43mm, Asc Ao 41mm; b. 02/2021 Echo: Ao root 47mm; c. 04/2021 CT chest: Asc Ao 4.5cm.   Asthma    Bradycardia    CAD in native artery    a. LHC 09/2015: 40% pCx, 35% mRCA.    Chronic HFrEF (heart failure with reduced ejection fraction) (HCC)    COPD (chronic obstructive pulmonary disease) (HCC)    Dilation of intestine 01/2015   Fibromyalgia    Frequent headaches    GERD (gastroesophageal reflux disease)    History of blood clots    eye    History of hiatal hernia    History of kidney stones    History of rheumatic fever    Hypertension    NICM (nonischemic cardiomyopathy) (HCC)    a. 07/2017 Echo: EF reduced to 40-45%; b. 07/2018 Echo: EF 25-30%; c. 11/2018 Echo: EF 30-35%; d. 02/2021 Echo: EF 45-50%, septal and apical septal HK. Mild MR. Sev dil LA. Mod dil RA. Ao root 47mm.   Permanent atrial fibrillation (HCC)    a. s/p TEE/DCCV 07/2015. b. H/o bleeding on Coumadin when INR >5, changed to Eliquis-subsequently discontinued; c. 04/2018 s/p SJM 3562 Quadra Allure MP DC PPM & AVN ablation.   S/P Minimally invasive maze operation for atrial fibrillation 10/22/2015   Complete bilateral atrial lesion set  using cryothermy and bipolar radiofrequency ablation with clipping of LA appendage via right mini thoracotomy approach   S/P minimally invasive mitral valve repair 10/22/2015   Complex valvuloplasty including triangular resection of posterior leaflet, artificial Gore-tex neochord placement x6 and 38 mm Sorin Memo 3D Rechord ring annuloplasty via right minithoracotomy approach   Severe mitral regurgitation s/p MVR    a. s/p MV repair 10/2015; b. 11/2018 Echo: Mild MS (mean grad ); d. 02/2021 Echo: Mild MR. Mean grad .   Sleep apnea    no longer uses cpap and doesn't use O2 at home   Stroke [facility])     no vision in right, on occasion sees a pinpoint    Thyroid disorder    TIA (transient ischemic attack)    Tobacco abuse     Past Surgical History:  Procedure Laterality Date   ANKLE SURGERY     AV NODE ABLATION N/A 05/04/2018   Procedure: AV NODE ABLATION;  Surgeon: Hillis Range, MD;  Location: [facility] INVASIVE CV LAB;  Service: Cardiovascular;  Laterality:  N/A;   BIV PACEMAKER INSERTION CRT-P N/A 05/03/2018   Procedure: BIV PACEMAKER INSERTION CRT-P;  Surgeon: Duke Salvia, MD;  Location[facility] INVASIVE CV LAB;  Service: Cardiovascular;  Laterality: N/A;   CARDIAC CATHETERIZATION N/A 10/03/2015   Procedure: Right and Left Heart Cath and Coronary Angiography;  Surgeon: Antonieta Iba, MD;  Location: [facility] INVASIVE CV LAB;  Service: Cardiovascular;  Laterality: N/A;   COLONOSCOPY     ELECTROPHYSIOLOGIC STUDY N/A 08/18/2015   Procedure: CARDIOVERSION;  Surgeon: Iran Ouch, MD;  Location: [facility] ORS;  Service: Cardiovascular;  Laterality: N/A;   MASS EXCISION N/A 03/04/2021   Procedure: EXCISION INNER NASAL CANCER;  Surgeon: Drema Halon, MD;  Location: [facility] OR;  Service: ENT;  Laterality: N/A;   MINIMALLY INVASIVE MAZE PROCEDURE N/A 10/22/2015   Procedure: MINIMALLY INVASIVE MAZE PROCEDURE;  Surgeon: Purcell Nails, MD;  Location: [facility] OR;  Service: Open Heart Surgery;  Laterality: N/A;   MITRAL VALVE REPAIR Right 10/22/2015   Procedure: MINIMALLY INVASIVE MITRAL VALVE REPAIR (MVR);  Surgeon: Purcell Nails, MD;  Location: [facility] OR;  Service: Open Heart Surgery;  Laterality: Right;   SINUS EXPLORATION     SKIN FULL THICKNESS GRAFT N/A 03/04/2021   Procedure: SKIN GRAFT FULL THICKNESS;  Surgeon: Drema Halon, MD;  Location: [facility] OR;  Service: ENT;  Laterality: N/A;   TEE WITHOUT CARDIOVERSION N/A 08/18/2015   Procedure: TRANSESOPHAGEAL ECHOCARDIOGRAM (TEE);  Surgeon: Iran Ouch, MD;  Location: [facility] ORS;  Service: Cardiovascular;  Laterality: N/A;   TEE WITHOUT CARDIOVERSION N/A 10/22/2015   Procedure: TRANSESOPHAGEAL ECHOCARDIOGRAM (TEE);  Surgeon: Purcell Nails, MD;  Location: [facility] OR;  Service: Open Heart Surgery;  Laterality: N/A;    Current Outpatient Medications  Medication Sig Dispense Refill   albuterol (VENTOLIN HFA) 108 (90 Base) MCG/ACT inhaler Inhale 2 puffs into the lungs every 6 (six) hours as needed for wheezing or  shortness of breath. 8 g 0   benzonatate (TESSALON) 200 MG capsule Take 1 capsule (200 mg total) by mouth 3 (three) times daily as needed. 30 capsule 0   furosemide (LASIX) 40 MG tablet Take 1 tablet (40 mg total) by mouth daily as needed (As needed for weight gain or shortness of breath). 90 tablet 3   ipratropium (ATROVENT) 0.06 % nasal spray Place 2 sprays into both nostrils 4 (four) times daily. As needed (Patient not taking: Reported on 10/25/2023) 15 mL  3   QUEtiapine (SEROQUEL) 25 MG tablet TAKE 1 TABLET BY MOUTH EVERYDAY AT BEDTIME (Patient not taking: Reported on 10/25/2023) 90 tablet 3   sertraline (ZOLOFT) 25 MG tablet TAKE 1 TABLET (25 MG TOTAL) BY MOUTH DAILY. (Patient not taking: Reported on 10/25/2023) 90 tablet 1   No current facility-administered medications for this visit.    Allergies  Allergen Reactions   Codeine Nausea Only   Digoxin And Related     SOB, bad dreams   Macrodantin [Nitrofurantoin Macrocrystal] Rash   Morphine And Codeine Rash      Review of Systems negative except from HPI and PMH  Physical Exam BP 115/66 (BP Location: Left Arm, Patient Position: Sitting)   Pulse 80   Ht 6' (1.829 m)   Wt 183 lb 3.2 oz (83.1 kg)   SpO2 98%   BMI 24.85 kg/m  Well developed and well nourished in no acute distress HENT normal Neck supple with JVP-flat Clear Device pocket well healed; without hematoma or erythema.  There is no tethering  Regular rate and rhythm, no  murmur Abd-soft with active BS No Clubbing cyanosis  edema Skin-warm and dry A & Oriented  Grossly normal sensory and motor function  ECG Atrial flutter with ventricular pacing with an upright QRS lead V1 and a QR in lead I  Device function is normal. 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  He is more open to anticoagulation now than previously.  He does not recall his disinclination from before.  The charts discuss question retinal venous versus arterial occlusion with some complications of bleeding.  In the chart reports that this was associated with a supratherapeutic INR.  Anticoagulation with a NOAC with anticipated to be safer.  He will discuss this with his wife.  Device function is normal.  No clear explanation here is to how we might ameliorate his dyspnea.  Arrange follow-up with Dr. Knute Neu.  Significant depression, psychosocial triggers.  Suggested he follow-up with his PCP.

## 2023-10-26 ENCOUNTER — Telehealth: Payer: Self-pay | Admitting: Licensed Clinical Social Worker

## 2023-10-26 LAB — CBC
Hematocrit: 45.7 % (ref 37.5–51.0)
Hemoglobin: 15.2 g/dL (ref 13.0–17.7)
MCH: 31.4 pg (ref 26.6–33.0)
MCHC: 33.3 g/dL (ref 31.5–35.7)
MCV: 94 fL (ref 79–97)
Platelets: 167 10*3/uL (ref 150–450)
RBC: 4.84 x10E6/uL (ref 4.14–5.80)
RDW: 11.9 % (ref 11.6–15.4)
WBC: 5.3 10*3/uL (ref 3.4–10.8)

## 2023-10-26 LAB — BASIC METABOLIC PANEL
BUN/Creatinine Ratio: 8 — ABNORMAL LOW (ref 10–24)
BUN: 10 mg/dL (ref 8–27)
CO2: 24 mmol/L (ref 20–29)
Calcium: 9.7 mg/dL (ref 8.6–10.2)
Chloride: 101 mmol/L (ref 96–106)
Creatinine, Ser: 1.22 mg/dL (ref 0.76–1.27)
Glucose: 85 mg/dL (ref 70–99)
Potassium: 4 mmol/L (ref 3.5–5.2)
Sodium: 140 mmol/L (ref 134–144)
eGFR: 60 mL/min/{1.73_m2} (ref >59)

## 2023-10-26 NOTE — Progress Notes (Unsigned)
Heart and Vascular Care Navigation  10/26/2023  Michael Delacruz 27-Apr-1941 [id number]  Reason for Referral:  Patient is participating in a Managed Medicaid Plan: No, [employer] Medicare only  Engaged with patient by telephone for initial visit for Heart and Vascular Care Coordination.                                                                                                   Assessment:                      LCSW was able to reach pt at [phone]. Introduced self, role, reason for call. Pt confirmed home address and PCP- is retired, resides with wife, adult daughter and granddaughter (3yo). He currently drives himself to and from appointments, he shares that income is tight in the household and he relies on social security he and his wife receive ("when the money comes in, it goes right out"). He denies any disconnection notices or being behind with rent at this time.   We discussed applying for assistance with food costs as there are multiple family members in the home. [employer] can assist with SNAP application. We also discussed finding ways to assist with costs such as co-pays for medications and assistance with his Part B premium that would help him save money. He can enroll in those programs if eligible by contacting SHIIP counseling at [facility] (free and do not work for any particular plan). Additional cost saving measures offered include LIEAP assistance starting 12/1 which can help with energy expenses through DSS in [facility].   Should pt need additional testing and has high burden from costs we may be able to provide further assistance to relieve burden from other cost such as housing, utilities etc. At this time without knowing what insurance will cover from recommended testing unable to assist additionally.   Pt encouraged to call me if any questions arise- I will f/u to ensure resources have reached him and review as necessary.   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 Yes   Home Assistive Devices/Equipment Dentures (specify type); Eyeglasses; Blood pressure cuff   DME Agency [facility]   [facility] Agency [facility]   Current home services DME  walker       Social History:  SDOH Screenings   Food Insecurity: No Food Insecurity (10/26/2023)  Housing: Low Risk  (10/26/2023)  Transportation Needs: No Transportation Needs (10/26/2023)  Utilities: Not At Risk (10/26/2023)  Alcohol Screen: Low Risk  (01/28/2023)  Depression (PHQ2-9): Low Risk  (01/28/2023)  Financial Resource Strain: Medium Risk (10/26/2023)  Physical Activity: Inactive (01/28/2023)  Social Connections: Moderately Isolated (01/28/2023)  Stress: No Stress Concern Present (01/28/2023)  Tobacco Use: High Risk (10/25/2023)  Health Literacy: Adequate Health Literacy (10/26/2023)    SDOH Interventions: Financial Resources:  Financial Strain Interventions: Other (Comment) (fixed income- LIEAP, Wellsite geologist, [employer] information) DSS for financial assistance  Food Insecurity:  Food Insecurity Interventions: Other (Comment) (will mail SNAP FNS card as multiple additional family members in the home)  Housing Insecurity:  Housing Interventions: Intervention Not Indicated  Transportation:   Transportation Interventions: Intervention Not Indicated, Patient Resources (Friends/Family), Payor  Benefit    Other Care Navigation Interventions:     Provided Pharmacy assistance resources  Referred to [facility] for Extra Help/Medicare Savings plan   Follow-up plan:   LCSW sent pt the following: my card, Food and Nutrition Services card to start [employer] application with [employer], [employer] application for energy assistance, [employer] flyer for [facility] Program/Extra Help applications to cover Part B premium and lower prescription costs, and Psychologist, clinical. Will f/u with pt to ensure these have been received and answer any questions. Communicated this with Misty Stanley, RN, and shared Patient Care Fund cannot pay for copays/medical bills but should testing/imaging cause financial hardship after processed by insurance that we may be able to try and relive that burden in another area of cost for pt.

## 2023-10-30 LAB — CUP PACEART INCLINIC DEVICE CHECK
Date Time Interrogation Session: 20241105162845
Implantable Lead Connection Status: 753985
Implantable Lead Connection Status: 753985
Implantable Lead Implant Date: 20190515
Implantable Lead Implant Date: 20190515
Implantable Lead Location: 753858
Implantable Lead Location: 753860
Implantable Lead Model: 5076
Implantable Pulse Generator Implant Date: 20190515
Pulse Gen Model: 3562
Pulse Gen Serial Number: 9427031

## 2023-11-02 ENCOUNTER — Telehealth (HOSPITAL_BASED_OUTPATIENT_CLINIC_OR_DEPARTMENT_OTHER): Payer: Self-pay | Admitting: Licensed Clinical Social Worker

## 2023-11-02 NOTE — Telephone Encounter (Signed)
H&V Care Navigation CSW Progress Note  Clinical Social Worker contacted patient by phone to f/u on community resources mailed to him. Was able to reach him at [phone]- shares he received paperwork but hasn't "studied it yet." LCSW provided guidance for resources sent including SHIIP counseling and how that could be helpful, SNAP card and calling [employer] for application assistance, and LIEAP application noting that this isnt available until 11/20/23. Pt states his medical bills are high but has not current past due accounts since 2023. Encouraged him again to review packet and call me if needed.   Patient is participating in a Managed Medicaid Plan:  No, Humana Medicare only  SDOH Screenings   Food Insecurity: No Food Insecurity (10/26/2023)  Housing: Low Risk  (10/26/2023)  Transportation Needs: No Transportation Needs (10/26/2023)  Utilities: Not At Risk (10/26/2023)  Alcohol Screen: Low Risk  (01/28/2023)  Depression (PHQ2-9): Low Risk  (01/28/2023)  Financial Resource Strain: Medium Risk (10/26/2023)  Physical Activity: Inactive (01/28/2023)  Social Connections: Moderately Isolated (01/28/2023)  Stress: No Stress Concern Present (01/28/2023)  Tobacco Use: High Risk (10/25/2023)  Health Literacy: Adequate Health Literacy (10/26/2023)   Michael Delacruz, MSW, LCSW Clinical Social Worker II [facility] Heart/Vascular Care Navigation  [phone]- work cell phone (preferred) [phone]- desk phone

## 2023-11-08 ENCOUNTER — Ambulatory Visit: Payer: Medicare HMO | Admitting: Cardiovascular Disease

## 2023-11-16 ENCOUNTER — Telehealth: Payer: Self-pay | Admitting: Internal Medicine

## 2023-11-16 DIAGNOSIS — I4821 Permanent atrial fibrillation: Secondary | ICD-10-CM

## 2023-11-16 NOTE — Telephone Encounter (Signed)
I called and spoke with the patient regarding his labs results from 10/25/23. The patient inquired if there was a medication he was supposed to start.  I reviewed the patient's office note and advised him that no changes were made at that visit, but there was some discussion regarding a blood thinner that he was going to speak with his wife about. Per the patient, he "forgot about that."  Per Dr. Odessa Fleming 10/25/23 office note: The patient remains at high risk for thromboembolism. He is more open to anticoagulation now than previously. He does not recall his disinclination from before. The charts discuss question retinal venous versus arterial occlusion with some complications of bleeding. In the chart reports that this was associated with a supratherapeutic INR. Anticoagulation with a NOAC with anticipated to be safer. He will discuss this with his wife.   I advised him I would forward a message to Dr. Graciela Husbands to please clarify what is needed at this time.   The patient is aware we will call him back once further MD recommendations are received.  He voices understanding and is agreeable.

## 2023-11-18 NOTE — Telephone Encounter (Signed)
Thx   The hope is to put him on NOAC The other thought that occurs to me is whether we should consdier him for a watchman using anticoagulation for just the procedure If he is willing to talk about the procedure, let us set him to see CL Thanks SK

## 2023-11-21 ENCOUNTER — Ambulatory Visit: Payer: Medicare HMO | Attending: Internal Medicine

## 2023-11-21 DIAGNOSIS — I5020 Unspecified systolic (congestive) heart failure: Secondary | ICD-10-CM

## 2023-11-21 DIAGNOSIS — Z950 Presence of cardiac pacemaker: Secondary | ICD-10-CM | POA: Diagnosis not present

## 2023-11-21 NOTE — Telephone Encounter (Signed)
The patient stated that he would be willing to see Dr. Lalla Brothers but does not want to proceed with any procedure at this point. He stated that he is willing to do what Dr. Graciela Husbands recommends "if it is within reason". Referral has been placed.   He stated that he would like to hold off on the Head Ct for now. He will call back and let us know if he changes him mind.   Duke Salvia, MD  Darene Lamer, LPN If he is still having headaches, would recommend that he have a head CT, but not sure how to make it more affordable for him Thanks SK

## 2023-11-22 NOTE — Progress Notes (Signed)
Remote pacemaker transmission.   

## 2023-11-23 NOTE — Progress Notes (Signed)
EPIC Encounter for ICM Monitoring  Patient Name: Michael Delacruz is a 82 y.o. male Date: 11/23/2023 Primary Care Physican: Smitty Cords, DO Primary Cardiologist: Mariah Milling Electrophysiologist: Joycelyn Schmid Pacing: 96%            03/16/2023 Weight: 175-178 lbs 06/27/2023 Weight: 174 lbs                                                            Transmission reviewed.    Diet:  Does not follow low salt diet and uses salt at home.     CorVue thoracic impedance suggesting normal fluid levels since 11/8.      Prescribed: Furosemide 40 mg Take 1 tablet (40 mg total) by mouth daily as needed (As needed for weight gain or shortness of breath).   Recommendations:  No changes.     Follow-up plan: ICM clinic phone appointment on 12/26/2023.   91 day device clinic remote transmission 01/24/2024.     EP/Cardiology Office Visits:  02/27/2024 with Dr. Mariah Milling.  Next EP visit due 10/2024 with Dr Graciela Husbands but no recall.    Copy of ICM check sent to Dr. Graciela Husbands.  3 month ICM trend: 11/21/2023.    12-14 Month ICM trend:     Karie Soda, RN 11/23/2023 1:39 PM

## 2023-12-21 ENCOUNTER — Emergency Department: Payer: Medicare HMO

## 2023-12-21 ENCOUNTER — Inpatient Hospital Stay
Admission: EM | Admit: 2023-12-21 | Discharge: 2023-12-29 | DRG: 480 | Disposition: A | Discharge status: Home Health | Payer: Medicare HMO | Attending: Family Medicine | Admitting: Family Medicine

## 2023-12-21 ENCOUNTER — Encounter: Payer: Self-pay | Admitting: Emergency Medicine

## 2023-12-21 ENCOUNTER — Other Ambulatory Visit: Payer: Self-pay

## 2023-12-21 DIAGNOSIS — S7290XA Unspecified fracture of unspecified femur, initial encounter for closed fracture: Secondary | ICD-10-CM | POA: Diagnosis present

## 2023-12-21 DIAGNOSIS — I4891 Unspecified atrial fibrillation: Secondary | ICD-10-CM | POA: Diagnosis not present

## 2023-12-21 DIAGNOSIS — I69398 Other sequelae of cerebral infarction: Secondary | ICD-10-CM | POA: Diagnosis not present

## 2023-12-21 DIAGNOSIS — Z5986 Financial insecurity: Secondary | ICD-10-CM

## 2023-12-21 DIAGNOSIS — F419 Anxiety disorder, unspecified: Secondary | ICD-10-CM | POA: Diagnosis present

## 2023-12-21 DIAGNOSIS — S72472A Torus fracture of lower end of left femur, initial encounter for closed fracture: Secondary | ICD-10-CM | POA: Diagnosis not present

## 2023-12-21 DIAGNOSIS — E876 Hypokalemia: Secondary | ICD-10-CM | POA: Diagnosis not present

## 2023-12-21 DIAGNOSIS — Z5941 Food insecurity: Secondary | ICD-10-CM

## 2023-12-21 DIAGNOSIS — W19XXXA Unspecified fall, initial encounter: Secondary | ICD-10-CM | POA: Diagnosis not present

## 2023-12-21 DIAGNOSIS — I5022 Chronic systolic (congestive) heart failure: Secondary | ICD-10-CM | POA: Diagnosis present

## 2023-12-21 DIAGNOSIS — M79651 Pain in right thigh: Secondary | ICD-10-CM | POA: Diagnosis present

## 2023-12-21 DIAGNOSIS — Z87442 Personal history of urinary calculi: Secondary | ICD-10-CM

## 2023-12-21 DIAGNOSIS — Z881 Allergy status to other antibiotic agents status: Secondary | ICD-10-CM

## 2023-12-21 DIAGNOSIS — D696 Thrombocytopenia, unspecified: Secondary | ICD-10-CM | POA: Diagnosis not present

## 2023-12-21 DIAGNOSIS — K219 Gastro-esophageal reflux disease without esophagitis: Secondary | ICD-10-CM | POA: Diagnosis present

## 2023-12-21 DIAGNOSIS — S72402S Unspecified fracture of lower end of left femur, sequela: Secondary | ICD-10-CM | POA: Diagnosis not present

## 2023-12-21 DIAGNOSIS — H5711 Ocular pain, right eye: Secondary | ICD-10-CM | POA: Diagnosis not present

## 2023-12-21 DIAGNOSIS — W1809XA Striking against other object with subsequent fall, initial encounter: Secondary | ICD-10-CM | POA: Diagnosis present

## 2023-12-21 DIAGNOSIS — J449 Chronic obstructive pulmonary disease, unspecified: Secondary | ICD-10-CM | POA: Diagnosis not present

## 2023-12-21 DIAGNOSIS — J439 Emphysema, unspecified: Secondary | ICD-10-CM | POA: Diagnosis not present

## 2023-12-21 DIAGNOSIS — R011 Cardiac murmur, unspecified: Secondary | ICD-10-CM | POA: Diagnosis present

## 2023-12-21 DIAGNOSIS — G4730 Sleep apnea, unspecified: Secondary | ICD-10-CM | POA: Diagnosis present

## 2023-12-21 DIAGNOSIS — I272 Pulmonary hypertension, unspecified: Secondary | ICD-10-CM | POA: Diagnosis not present

## 2023-12-21 DIAGNOSIS — Z01810 Encounter for preprocedural cardiovascular examination: Secondary | ICD-10-CM | POA: Diagnosis not present

## 2023-12-21 DIAGNOSIS — K121 Other forms of stomatitis: Secondary | ICD-10-CM | POA: Diagnosis not present

## 2023-12-21 DIAGNOSIS — S79102A Unspecified physeal fracture of lower end of left femur, initial encounter for closed fracture: Secondary | ICD-10-CM | POA: Diagnosis not present

## 2023-12-21 DIAGNOSIS — F17210 Nicotine dependence, cigarettes, uncomplicated: Secondary | ICD-10-CM | POA: Diagnosis present

## 2023-12-21 DIAGNOSIS — R410 Disorientation, unspecified: Secondary | ICD-10-CM | POA: Insufficient documentation

## 2023-12-21 DIAGNOSIS — H5461 Unqualified visual loss, right eye, normal vision left eye: Secondary | ICD-10-CM | POA: Diagnosis present

## 2023-12-21 DIAGNOSIS — M1990 Unspecified osteoarthritis, unspecified site: Secondary | ICD-10-CM | POA: Diagnosis present

## 2023-12-21 DIAGNOSIS — I7121 Aneurysm of the ascending aorta, without rupture: Secondary | ICD-10-CM | POA: Diagnosis present

## 2023-12-21 DIAGNOSIS — F05 Delirium due to known physiological condition: Secondary | ICD-10-CM | POA: Diagnosis not present

## 2023-12-21 DIAGNOSIS — Y92230 Patient room in hospital as the place of occurrence of the external cause: Secondary | ICD-10-CM | POA: Diagnosis not present

## 2023-12-21 DIAGNOSIS — T402X5A Adverse effect of other opioids, initial encounter: Secondary | ICD-10-CM | POA: Diagnosis not present

## 2023-12-21 DIAGNOSIS — Y9301 Activity, walking, marching and hiking: Secondary | ICD-10-CM | POA: Diagnosis present

## 2023-12-21 DIAGNOSIS — G928 Other toxic encephalopathy: Secondary | ICD-10-CM | POA: Diagnosis not present

## 2023-12-21 DIAGNOSIS — S0990XA Unspecified injury of head, initial encounter: Secondary | ICD-10-CM | POA: Diagnosis not present

## 2023-12-21 DIAGNOSIS — Y92009 Unspecified place in unspecified non-institutional (private) residence as the place of occurrence of the external cause: Secondary | ICD-10-CM

## 2023-12-21 DIAGNOSIS — N189 Chronic kidney disease, unspecified: Secondary | ICD-10-CM | POA: Diagnosis not present

## 2023-12-21 DIAGNOSIS — J4489 Other specified chronic obstructive pulmonary disease: Secondary | ICD-10-CM | POA: Diagnosis not present

## 2023-12-21 DIAGNOSIS — I428 Other cardiomyopathies: Secondary | ICD-10-CM | POA: Diagnosis present

## 2023-12-21 DIAGNOSIS — E785 Hyperlipidemia, unspecified: Secondary | ICD-10-CM | POA: Diagnosis present

## 2023-12-21 DIAGNOSIS — I10 Essential (primary) hypertension: Secondary | ICD-10-CM | POA: Diagnosis not present

## 2023-12-21 DIAGNOSIS — I2510 Atherosclerotic heart disease of native coronary artery without angina pectoris: Secondary | ICD-10-CM | POA: Diagnosis present

## 2023-12-21 DIAGNOSIS — N1831 Chronic kidney disease, stage 3a: Secondary | ICD-10-CM | POA: Diagnosis not present

## 2023-12-21 DIAGNOSIS — N183 Chronic kidney disease, stage 3 unspecified: Secondary | ICD-10-CM | POA: Insufficient documentation

## 2023-12-21 DIAGNOSIS — M79606 Pain in leg, unspecified: Secondary | ICD-10-CM | POA: Diagnosis not present

## 2023-12-21 DIAGNOSIS — N179 Acute kidney failure, unspecified: Secondary | ICD-10-CM | POA: Diagnosis present

## 2023-12-21 DIAGNOSIS — M25562 Pain in left knee: Secondary | ICD-10-CM | POA: Diagnosis not present

## 2023-12-21 DIAGNOSIS — S72402A Unspecified fracture of lower end of left femur, initial encounter for closed fracture: Principal | ICD-10-CM

## 2023-12-21 DIAGNOSIS — M7989 Other specified soft tissue disorders: Secondary | ICD-10-CM | POA: Diagnosis not present

## 2023-12-21 DIAGNOSIS — S72422A Displaced fracture of lateral condyle of left femur, initial encounter for closed fracture: Principal | ICD-10-CM | POA: Diagnosis present

## 2023-12-21 DIAGNOSIS — Z79899 Other long term (current) drug therapy: Secondary | ICD-10-CM

## 2023-12-21 DIAGNOSIS — M542 Cervicalgia: Secondary | ICD-10-CM | POA: Diagnosis not present

## 2023-12-21 DIAGNOSIS — M797 Fibromyalgia: Secondary | ICD-10-CM | POA: Diagnosis present

## 2023-12-21 DIAGNOSIS — R0789 Other chest pain: Secondary | ICD-10-CM | POA: Diagnosis not present

## 2023-12-21 DIAGNOSIS — I700 Atherosclerosis of aorta: Secondary | ICD-10-CM | POA: Diagnosis present

## 2023-12-21 DIAGNOSIS — I130 Hypertensive heart and chronic kidney disease with heart failure and stage 1 through stage 4 chronic kidney disease, or unspecified chronic kidney disease: Secondary | ICD-10-CM | POA: Diagnosis not present

## 2023-12-21 DIAGNOSIS — T4145XA Adverse effect of unspecified anesthetic, initial encounter: Secondary | ICD-10-CM | POA: Diagnosis not present

## 2023-12-21 DIAGNOSIS — I4821 Permanent atrial fibrillation: Secondary | ICD-10-CM | POA: Diagnosis not present

## 2023-12-21 DIAGNOSIS — S72422D Displaced fracture of lateral condyle of left femur, subsequent encounter for closed fracture with routine healing: Secondary | ICD-10-CM

## 2023-12-21 DIAGNOSIS — Z823 Family history of stroke: Secondary | ICD-10-CM

## 2023-12-21 DIAGNOSIS — R079 Chest pain, unspecified: Secondary | ICD-10-CM | POA: Diagnosis not present

## 2023-12-21 DIAGNOSIS — Z888 Allergy status to other drugs, medicaments and biological substances status: Secondary | ICD-10-CM

## 2023-12-21 DIAGNOSIS — Z86718 Personal history of other venous thrombosis and embolism: Secondary | ICD-10-CM

## 2023-12-21 DIAGNOSIS — I4892 Unspecified atrial flutter: Secondary | ICD-10-CM | POA: Diagnosis present

## 2023-12-21 DIAGNOSIS — Z8249 Family history of ischemic heart disease and other diseases of the circulatory system: Secondary | ICD-10-CM | POA: Diagnosis not present

## 2023-12-21 DIAGNOSIS — Z885 Allergy status to narcotic agent status: Secondary | ICD-10-CM

## 2023-12-21 DIAGNOSIS — Z950 Presence of cardiac pacemaker: Secondary | ICD-10-CM

## 2023-12-21 LAB — BASIC METABOLIC PANEL
Anion gap: 12 (ref 5–15)
BUN: 11 mg/dL (ref 8–23)
CO2: 24 mmol/L (ref 22–32)
Calcium: 8.8 mg/dL — ABNORMAL LOW (ref 8.9–10.3)
Chloride: 103 mmol/L (ref 98–111)
Creatinine, Ser: 1.24 mg/dL (ref 0.61–1.24)
GFR, Estimated: 59 mL/min — ABNORMAL LOW (ref >60)
Glucose, Bld: 107 mg/dL — ABNORMAL HIGH (ref 70–99)
Potassium: 3.4 mmol/L — ABNORMAL LOW (ref 3.5–5.1)
Sodium: 139 mmol/L (ref 135–145)

## 2023-12-21 LAB — CBC WITH DIFFERENTIAL/PLATELET
Abs Immature Granulocytes: 0.01 10*3/uL (ref 0.00–0.07)
Basophils Absolute: 0 10*3/uL (ref 0.0–0.1)
Basophils Relative: 1 %
Eosinophils Absolute: 0.1 10*3/uL (ref 0.0–0.5)
Eosinophils Relative: 3 %
HCT: 44.5 % (ref 39.0–52.0)
Hemoglobin: 14.5 g/dL (ref 13.0–17.0)
Immature Granulocytes: 0 %
Lymphocytes Relative: 21 %
Lymphs Abs: 0.9 10*3/uL (ref 0.7–4.0)
MCH: 31.1 pg (ref 26.0–34.0)
MCHC: 32.6 g/dL (ref 30.0–36.0)
MCV: 95.5 fL (ref 80.0–100.0)
Monocytes Absolute: 0.5 10*3/uL (ref 0.1–1.0)
Monocytes Relative: 12 %
Neutro Abs: 2.6 10*3/uL (ref 1.7–7.7)
Neutrophils Relative %: 63 %
Platelets: 126 10*3/uL — ABNORMAL LOW (ref 150–400)
RBC: 4.66 MIL/uL (ref 4.22–5.81)
RDW: 13.1 % (ref 11.5–15.5)
WBC: 4.2 10*3/uL (ref 4.0–10.5)
nRBC: 0 % (ref 0.0–0.2)

## 2023-12-21 LAB — TROPONIN I (HIGH SENSITIVITY)
Troponin I (High Sensitivity): 13 ng/L (ref <18)
Troponin I (High Sensitivity): 13 ng/L (ref <18)

## 2023-12-21 MED ORDER — MORPHINE SULFATE (PF) 4 MG/ML IV SOLN
4.0000 mg | Freq: Once | INTRAVENOUS | Status: AC
  Administered 2023-12-21: 4 mg via INTRAVENOUS
  Filled 2023-12-21: qty 1

## 2023-12-21 MED ORDER — METOPROLOL TARTRATE 25 MG PO TABS
25.0000 mg | ORAL_TABLET | Freq: Two times a day (BID) | ORAL | Status: DC
  Administered 2023-12-21 – 2023-12-23 (×5): 25 mg via ORAL
  Filled 2023-12-21 (×5): qty 1

## 2023-12-21 MED ORDER — POTASSIUM CHLORIDE CRYS ER 20 MEQ PO TBCR: 40.0000 meq | EXTENDED_RELEASE_TABLET | Freq: Once | ORAL | Status: DC

## 2023-12-21 MED ORDER — ONDANSETRON HCL 4 MG/2ML IJ SOLN: 4.0000 mg | Freq: Four times a day (QID) | INTRAMUSCULAR | Status: DC | PRN

## 2023-12-21 MED ORDER — HYDRALAZINE HCL 20 MG/ML IJ SOLN: 5.0000 mg | Freq: Four times a day (QID) | INTRAMUSCULAR | Status: DC | PRN

## 2023-12-21 MED ORDER — ALBUTEROL SULFATE (2.5 MG/3ML) 0.083% IN NEBU: 2.5000 mg | INHALATION_SOLUTION | Freq: Four times a day (QID) | RESPIRATORY_TRACT | Status: DC | PRN

## 2023-12-21 MED ORDER — HYDROMORPHONE HCL 1 MG/ML IJ SOLN
0.5000 mg | INTRAMUSCULAR | Status: DC | PRN
  Administered 2023-12-21 – 2023-12-22 (×4): 0.5 mg via INTRAVENOUS
  Filled 2023-12-21 (×4): qty 0.5

## 2023-12-21 MED ORDER — CEFAZOLIN SODIUM-DEXTROSE 2-4 GM/100ML-% IV SOLN
2.0000 g | INTRAVENOUS | Status: AC
Start: 2023-12-22 — End: 2023-12-23
  Administered 2023-12-22: 2 g via INTRAVENOUS
  Filled 2023-12-21: qty 100

## 2023-12-21 MED ORDER — TRANEXAMIC ACID-NACL 1000-0.7 MG/100ML-% IV SOLN
1000.0000 mg | INTRAVENOUS | Status: DC
  Filled 2023-12-21: qty 100

## 2023-12-21 MED ORDER — ALBUTEROL SULFATE HFA 108 (90 BASE) MCG/ACT IN AERS: 2.0000 | INHALATION_SPRAY | Freq: Four times a day (QID) | RESPIRATORY_TRACT | Status: DC | PRN

## 2023-12-21 MED ORDER — CHLORHEXIDINE GLUCONATE 4 % EX SOLN
60.0000 mL | Freq: Once | CUTANEOUS | Status: AC
  Administered 2023-12-22: 4 via TOPICAL

## 2023-12-21 MED ORDER — POTASSIUM CHLORIDE CRYS ER 20 MEQ PO TBCR
40.0000 meq | EXTENDED_RELEASE_TABLET | Freq: Once | ORAL | Status: AC
  Administered 2023-12-21: 40 meq via ORAL
  Filled 2023-12-21: qty 2

## 2023-12-21 MED ORDER — ENOXAPARIN SODIUM 40 MG/0.4ML IJ SOSY: 40.0000 mg | PREFILLED_SYRINGE | INTRAMUSCULAR | Status: DC

## 2023-12-21 MED ORDER — OXYCODONE HCL 5 MG PO TABS
5.0000 mg | ORAL_TABLET | ORAL | Status: DC | PRN
  Administered 2023-12-22 – 2023-12-24 (×7): 10 mg via ORAL
  Filled 2023-12-21 (×7): qty 2

## 2023-12-21 NOTE — ED Triage Notes (Signed)
 First nurse note: pt to ED [facility] from home for trip and fall over christmas toys. Pain to left knee. Reports hit head, denies LOC or blood thinners Pt visually impaired

## 2023-12-21 NOTE — ED Provider Notes (Signed)
 [facility] Provider Note    Event Date/Time   First MD Initiated Contact with Patient 12/21/23 1603     (approximate)   History   Chief Complaint Leg Injury   HPI  Michael Delacruz is a 83 y.o. male with past medical history of CAD, atrial fibrillation, CHF, and COPD who presents to the ED complaining of leg injury.  Patient reports that just prior to arrival he tripped over a Christmas toy and fell to the ground, hitting his head as well as his left knee.  Since the injury he has had severe pain in his left knee with any movement, has been unable to bear weight on the leg.  He denies losing consciousness after hitting his head, but does complain of headache and neck pain.  He also reports that he has had a few twinges of pain in his chest since the fall, but does not think he had an injury to his chest with the fall.  He denies any recent fevers, cough, or difficulty breathing.  He denies any pain to his abdomen or other extremities.     Physical Exam   Triage Vital Signs: ED Triage Vitals  Encounter Vitals Group     BP 12/21/23 1453 138/88     Systolic BP Percentile --      Diastolic BP Percentile --      Pulse Rate 12/21/23 1453 88     Resp 12/21/23 1453 16     Temp 12/21/23 1453 98.4 F (36.9 C)     Temp Source 12/21/23 1453 Oral     SpO2 12/21/23 1453 98 %     Weight --      Height --      Head Circumference --      Peak Flow --      Pain Score 12/21/23 1455 10     Pain Loc --      Pain Education --      Exclude from Growth Chart --     Most recent vital signs: Vitals:   12/21/23 1638 12/21/23 1700  BP: (!) 165/104 (!) 159/94  Pulse: 82 79  Resp: 18 16  Temp:    SpO2: 99% 100%    Constitutional: Alert and oriented. Eyes: Conjunctivae are normal. Head: Atraumatic. Nose: No congestion/rhinnorhea. Mouth/Throat: Mucous membranes are moist.  Neck: Midline cervical spine tenderness to palpation noted. Cardiovascular: Normal rate,  regular rhythm. Grossly normal heart sounds.  2+ radial and DP pulses bilaterally. Respiratory: Normal respiratory effort.  No retractions. Lungs CTAB.  No chest wall tenderness to palpation. Gastrointestinal: Soft and nontender. No distention. Musculoskeletal: Diffuse tenderness to palpation of left knee with no obvious deformity.  No tenderness to palpation of bilateral hips or ankles as well as right knee.  No upper extremity bony tenderness to palpation. Neurologic:  Normal speech and language. No gross focal neurologic deficits are appreciated.    ED Results / Procedures / Treatments   Labs (all labs ordered are listed, but only abnormal results are displayed) Labs Reviewed  CBC WITH DIFFERENTIAL/PLATELET - Abnormal; Notable for the following components:      Result Value   Platelets 126 (*)    All other components within normal limits  BASIC METABOLIC PANEL - Abnormal; Notable for the following components:   Potassium 3.4 (*)    Glucose, Bld 107 (*)    Calcium  8.8 (*)    GFR, Estimated 59 (*)    All other components within  normal limits  TROPONIN I (HIGH SENSITIVITY)  TROPONIN I (HIGH SENSITIVITY)     EKG  ED ECG REPORT I, Carlin Palin, the attending physician, personally viewed and interpreted this ECG.   Date: 12/21/2023  EKG Time: 16:35  Rate: 81  Rhythm: Paced rhythm  Axis: Normal  Intervals:nonspecific intraventricular conduction delay  ST&T Change: None  RADIOLOGY Left knee x-ray reviewed and interpreted by me with fracture of the distal femur extending into the joint.  PROCEDURES:  Critical Care performed: No  Procedures   MEDICATIONS ORDERED IN ED: Medications  morphine  (PF) 4 MG/ML injection 4 mg (4 mg Intravenous Given 12/21/23 1626)     IMPRESSION / MDM / ASSESSMENT AND PLAN / ED COURSE  I reviewed the triage vital signs and the nursing notes.                              83 y.o. male with past medical history of atrial fibrillation, CAD,  COPD, and CHF who presents to the ED complaining of trip and fall over a toy, striking his head and left knee, now also complains of twinges of chest pain.  Patient's presentation is most consistent with acute presentation with potential threat to life or bodily function.  Differential diagnosis includes, but is not limited to, knee fracture, dislocation, intracranial injury, cervical spine injury, ACS, PE, pneumonia, pneumothorax, musculoskeletal pain, GERD.  Patient nontoxic-appearing and in no acute distress, vital signs are unremarkable.  He has severe pain around his left knee but remains neurovascular intact distally.  X-ray shows fracture of his distal left femur extending into the joint.  CT scan was also performed, findings reviewed with Dr. Genelle of orthopedics who recommends knee immobilizer and admission to hospitalist service.  We will also check CT head and cervical spine for traumatic injury.  His twinges of chest pain seem atypical for ACS, EKG shows no evidence of arrhythmia or ischemia and troponin as well as chest x-ray are pending at this time.  Patient denies any ongoing chest pain.  CT head and cervical spine are negative for acute process, chest x-ray also unremarkable.  Troponin within normal limits and remainder of labs are reassuring with no significant anemia, leukocytosis, electrolyte abnormality, or AKI.  Low suspicion for ACS at this time given his atypical symptoms.  Case discussed with hospitalist for admission.      FINAL CLINICAL IMPRESSION(S) / ED DIAGNOSES   Final diagnoses:  Closed fracture of distal end of left femur, unspecified fracture morphology, initial encounter (HCC)  Atypical chest pain     Rx / DC Orders   ED Discharge Orders     None        Note:  This document was prepared using Dragon voice recognition software and may include unintentional dictation errors.   Palin Carlin, MD 12/21/23 [id number]

## 2023-12-21 NOTE — ED Notes (Signed)
 Patient transported to CT

## 2023-12-21 NOTE — H&P (Signed)
 History and Physical    BEACHER EVERY FMW:[id number] DOB: 08-13-1941 DOA: 12/21/2023  PCP: Edman Marsa PARAS, DO (Confirm with patient/family/NH records and if not entered, this has to be entered at [facility] point of entry) Patient coming from: Home  I have personally briefly reviewed patient's old medical records in [facility]  Chief Complaint: Clem[place] and broke left knee  HPI: Michael Delacruz is a 83 y.o. male with medical history significant of non obstructive CAD 2016, stroke with chronic ambulation dysfunction, chronic HFrEF with LVEF 45-50%, mitral valve repair, PPM, COPD, presented with fall and left knee pain.  Patient had a mechanical fall this morning tripping on a furniture and fell and broke left knee.  With excruciating pain patient could not stand up again.  Patient also reported 3 episode of sharp/twitching like chest pain lasted few seconds after the fall.  He reported similar chest pains  for a while, usually happens randomly lasted for few seconds denied any associated symptoms such as lightheadedness sweating nauseous or palpitations.  He follows with Dr. Gollan and was last seen 1 month ago.  Patient had a stroke about 3 years ago and since then he has not had balance issue and lifestyle change to sedentary and in house.  He lives on of 1 story house and does not exercise much.  Denied head or neck injury no LOC during or after today's episode.  He hit his right forehead then fell and had a skin laceration and minor bleeding from the lesion but stopped by itself. ED Course: Afebrile, blood pressure elevated nonhypoxic CT head and neck negative for acute findings, x-ray and CT left knee showed vertical oriented fracture of distal femur into the left knee.  Blood work showed troponin 13, EKG showed sinus rhythm, no acute ST changes.  Review of Systems: As per HPI otherwise 10 point review of systems negative.  Unacceptable ROS statements: "10 systems reviewed,"  "Extensive" (without elaboration).  Acceptable ROS statements: "All others negative," "All others reviewed and are negative," and "All others unremarkable," with at LEAST ONE ROS documented Can't double dip - if using for HPI can't use for ROS  Past Medical History:  Diagnosis Date   Anxiety    Arthritis    Ascending aortic aneurysm (HCC)    a. 11/2018 Echo: Ao root 43mm, Asc Ao 41mm; b. 02/2021 Echo: Ao root 47mm; c. 04/2021 CT chest: Asc Ao 4.5cm.   Asthma    Bradycardia    CAD in native artery    a. LHC 09/2015: 40% pCx, 35% mRCA.   Chronic HFrEF (heart failure with reduced ejection fraction) (HCC)    COPD (chronic obstructive pulmonary disease) (HCC)    Dilation of intestine 01/2015   Fibromyalgia    Frequent headaches    GERD (gastroesophageal reflux disease)    History of blood clots    eye    History of hiatal hernia    History of kidney stones    History of rheumatic fever    Hypertension    NICM (nonischemic cardiomyopathy) (HCC)    a. 07/2017 Echo: EF reduced to 40-45%; b. 07/2018 Echo: EF 25-30%; c. 11/2018 Echo: EF 30-35%; d. 02/2021 Echo: EF 45-50%, septal and apical septal HK. Mild MR. Sev dil LA. Mod dil RA. Ao root 47mm.   Permanent atrial fibrillation (HCC)    a. s/p TEE/DCCV 07/2015. b. H/o bleeding on Coumadin  when INR >5, changed to Eliquis -subsequently discontinued; c. 04/2018 s/p SJM 3562 Quadra Allure MP  DC PPM & AVN ablation.   S/P Minimally invasive maze operation for atrial fibrillation 10/22/2015   Complete bilateral atrial lesion set using cryothermy and bipolar radiofrequency ablation with clipping of LA appendage via right mini thoracotomy approach   S/P minimally invasive mitral valve repair 10/22/2015   Complex valvuloplasty including triangular resection of posterior leaflet, artificial Gore-tex neochord placement x6 and 38 mm Sorin Memo 3D Rechord ring annuloplasty via right minithoracotomy approach   Severe mitral regurgitation s/p MVR    a. s/p MV  repair 10/2015; b. 11/2018 Echo: Mild MS (mean grad ); d. 02/2021 Echo: Mild MR. Mean grad .   Sleep apnea    no longer uses cpap and doesn't use O2 at home   Stroke [facility])     no vision in right, on occasion sees a pinpoint    Thyroid  disorder    TIA (transient ischemic attack)    Tobacco abuse     Past Surgical History:  Procedure Laterality Date   ANKLE SURGERY     AV NODE ABLATION N/A 05/04/2018   Procedure: AV NODE ABLATION;  Surgeon: Kelsie Agent, MD;  Location: [facility] INVASIVE CV LAB;  Service: Cardiovascular;  Laterality: N/A;   BIV PACEMAKER INSERTION CRT-P N/A 05/03/2018   Procedure: BIV PACEMAKER INSERTION CRT-P;  Surgeon: Fernande Elspeth BROCKS, MD;  Location: [facility] INVASIVE CV LAB;  Service: Cardiovascular;  Laterality: N/A;   CARDIAC CATHETERIZATION N/A 10/03/2015   Procedure: Right and Left Heart Cath and Coronary Angiography;  Surgeon: Evalene JINNY Lunger, MD;  Location: [facility] INVASIVE CV LAB;  Service: Cardiovascular;  Laterality: N/A;   COLONOSCOPY     ELECTROPHYSIOLOGIC STUDY N/A 08/18/2015   Procedure: CARDIOVERSION;  Surgeon: Deatrice DELENA Cage, MD;  Location: [facility] ORS;  Service: Cardiovascular;  Laterality: N/A;   MASS EXCISION N/A 03/04/2021   Procedure: EXCISION INNER NASAL CANCER;  Surgeon: Ethyl Lonni BRAVO, MD;  Location: [facility] OR;  Service: ENT;  Laterality: N/A;   MINIMALLY INVASIVE MAZE PROCEDURE N/A 10/22/2015   Procedure: MINIMALLY INVASIVE MAZE PROCEDURE;  Surgeon: Sudie VEAR Laine, MD;  Location: [facility] OR;  Service: Open Heart Surgery;  Laterality: N/A;   MITRAL VALVE REPAIR Right 10/22/2015   Procedure: MINIMALLY INVASIVE MITRAL VALVE REPAIR (MVR);  Surgeon: Sudie VEAR Laine, MD;  Location: [facility] OR;  Service: Open Heart Surgery;  Laterality: Right;   SINUS EXPLORATION     SKIN FULL THICKNESS GRAFT N/A 03/04/2021   Procedure: SKIN GRAFT FULL THICKNESS;  Surgeon: Ethyl Lonni BRAVO, MD;  Location: [facility] OR;  Service: ENT;  Laterality: N/A;   TEE WITHOUT CARDIOVERSION N/A  08/18/2015   Procedure: TRANSESOPHAGEAL ECHOCARDIOGRAM (TEE);  Surgeon: Deatrice DELENA Cage, MD;  Location: [facility] ORS;  Service: Cardiovascular;  Laterality: N/A;   TEE WITHOUT CARDIOVERSION N/A 10/22/2015   Procedure: TRANSESOPHAGEAL ECHOCARDIOGRAM (TEE);  Surgeon: Sudie VEAR Laine, MD;  Location: [facility] OR;  Service: Open Heart Surgery;  Laterality: N/A;     reports that he has been smoking cigarettes. He started smoking about 65 years ago. He has a 32.5 pack-year smoking history. He has never used smokeless tobacco. He reports that he does not currently use alcohol after a past usage of about 1.0 standard drink of alcohol per week. He reports that he does not use drugs.  Allergies  Allergen Reactions   Codeine Nausea Only   Digoxin  And Related     SOB, bad dreams   Macrodantin [Nitrofurantoin Macrocrystal] Rash   Morphine  And Codeine Rash    Family History  Problem Relation Age of Onset   Stroke Mother    Irregular heart beat Mother    Heart murmur Brother    Pulmonary embolism Brother    Hypertension Other    Pulmonary embolism Maternal Uncle      Prior to Admission medications   Medication Sig Start Date End Date Taking? Authorizing Provider  albuterol  (VENTOLIN  HFA) 108 (90 Base) MCG/ACT inhaler Inhale 2 puffs into the lungs every 6 (six) hours as needed for wheezing or shortness of breath. 04/15/23  Yes Baity, Angeline ORN, NP  benzonatate  (TESSALON ) 200 MG capsule Take 1 capsule (200 mg total) by mouth 3 (three) times daily as needed. Patient not taking: Reported on 12/21/2023 04/15/23   Antonette Angeline ORN, NP  furosemide  (LASIX ) 40 MG tablet Take 1 tablet (40 mg total) by mouth daily as needed (As needed for weight gain or shortness of breath). Patient not taking: Reported on 12/21/2023 02/09/21   Vivienne Lonni Ingle, NP  ipratropium (ATROVENT ) 0.06 % nasal spray Place 2 sprays into both nostrils 4 (four) times daily. As needed Patient not taking: Reported on 10/25/2023 02/06/21    Edman Marsa PARAS, DO  QUEtiapine  (SEROQUEL ) 25 MG tablet TAKE 1 TABLET BY MOUTH EVERYDAY AT BEDTIME Patient not taking: Reported on 10/25/2023 01/05/23   Edman Marsa PARAS, DO  sertraline  (ZOLOFT ) 25 MG tablet TAKE 1 TABLET (25 MG TOTAL) BY MOUTH DAILY. Patient not taking: Reported on 10/25/2023 04/16/22   Edman Marsa PARAS, DO    Physical Exam: Vitals:   12/21/23 1453 12/21/23 1638 12/21/23 1700  BP: 138/88 (!) 165/104 (!) 159/94  Pulse: 88 82 79  Resp: 16 18 16   Temp: 98.4 F (36.9 C)    TempSrc: Oral    SpO2: 98% 99% 100%    Constitutional: NAD, calm, comfortable Vitals:   12/21/23 1453 12/21/23 1638 12/21/23 1700  BP: 138/88 (!) 165/104 (!) 159/94  Pulse: 88 82 79  Resp: 16 18 16   Temp: 98.4 F (36.9 C)    TempSrc: Oral    SpO2: 98% 99% 100%   Eyes: PERRL, lids and conjunctivae normal ENMT: Mucous membranes are moist. Posterior pharynx clear of any exudate or lesions.Normal dentition.  Neck: normal, supple, no masses, no thyromegaly Respiratory: clear to auscultation bilaterally, no wheezing, no crackles. Normal respiratory effort. No accessory muscle use.  Cardiovascular: Regular rate and rhythm, no murmurs / rubs / gallops. No extremity edema. 2+ pedal pulses. No carotid bruits.  Abdomen: no tenderness, no masses palpated. No hepatosplenomegaly. Bowel sounds positive.  Musculoskeletal: no clubbing / cyanosis. No joint deformity upper and lower extremities. Good ROM, no contractures. Normal muscle tone.  Skin: no rashes, lesions, ulcers. No induration Neurologic: CN 2-12 grossly intact. Sensation intact, DTR normal. Strength 5/5 in all 4.  Psychiatric: Normal judgment and insight. Alert and oriented x 3. Normal mood.     Labs on Admission: I have personally reviewed following labs and imaging studies  CBC: Recent Labs  Lab 12/21/23 1622  WBC 4.2  NEUTROABS 2.6  HGB 14.5  HCT 44.5  MCV 95.5  PLT 126*   Basic Metabolic Panel: Recent Labs   Lab 12/21/23 1622  NA 139  K 3.4*  CL 103  CO2 24  GLUCOSE 107*  BUN 11  CREATININE 1.24  CALCIUM  8.8*   GFR: CrCl cannot be calculated (Unknown ideal weight.). Liver Function Tests: No results for input(s): AST, ALT, ALKPHOS, BILITOT, PROT, ALBUMIN  in the last 168 hours. No results for input(s): LIPASE, AMYLASE  in the last 168 hours. No results for input(s): AMMONIA in the last 168 hours. Coagulation Profile: No results for input(s): INR, PROTIME in the last 168 hours. Cardiac Enzymes: No results for input(s): CKTOTAL, CKMB, CKMBINDEX, TROPONINI in the last 168 hours. BNP (last 3 results) No results for input(s): PROBNP in the last 8760 hours. HbA1C: No results for input(s): HGBA1C in the last 72 hours. CBG: No results for input(s): GLUCAP in the last 168 hours. Lipid Profile: No results for input(s): CHOL, HDL, LDLCALC, TRIG, CHOLHDL, LDLDIRECT in the last 72 hours. Thyroid  Function Tests: No results for input(s): TSH, T4TOTAL, FREET4, T3FREE, THYROIDAB in the last 72 hours. Anemia Panel: No results for input(s): VITAMINB12, FOLATE, FERRITIN, TIBC, IRON, RETICCTPCT in the last 72 hours. Urine analysis:    Component Value Date/Time   COLORURINE YELLOW (A) 01/30/2021 2031   APPEARANCEUR CLEAR (A) 01/30/2021 2031   APPEARANCEUR CLEAR 01/24/2015 1849   LABSPEC 1.006 01/30/2021 2031   LABSPEC 1.025 01/24/2015 1849   PHURINE 6.0 01/30/2021 2031   GLUCOSEU NEGATIVE 01/30/2021 2031   GLUCOSEU NEGATIVE 01/24/2015 1849   HGBUR NEGATIVE 01/30/2021 2031   BILIRUBINUR Negative 02/24/2022 0940   BILIRUBINUR NEGATIVE 01/24/2015 1849   KETONESUR NEGATIVE 01/30/2021 2031   PROTEINUR Positive (A) 02/24/2022 0940   PROTEINUR NEGATIVE 01/30/2021 2031   UROBILINOGEN 0.2 02/24/2022 0940   UROBILINOGEN 1.0 10/20/2015 1259   NITRITE Negative 02/24/2022 0940   NITRITE NEGATIVE 01/30/2021 2031   LEUKOCYTESUR  Negative 02/24/2022 0940   LEUKOCYTESUR NEGATIVE 01/30/2021 2031   LEUKOCYTESUR TRACE 01/24/2015 1849    Radiological Exams on Admission: CT Cervical Spine Wo Contrast Result Date: 12/21/2023 CLINICAL DATA:  Fall from standing with neck pain. EXAM: CT CERVICAL SPINE WITHOUT CONTRAST TECHNIQUE: Multidetector CT imaging of the cervical spine was performed without intravenous contrast. Multiplanar CT image reconstructions were also generated. RADIATION DOSE REDUCTION: This exam was performed according to the departmental dose-optimization program which includes automated exposure control, adjustment of the mA and/or kV according to patient size and/or use of iterative reconstruction technique. COMPARISON:  MR cervical spine dated 04/13/2007. FINDINGS: Alignment: Normal. Skull base and vertebrae: No acute fracture. No primary bone lesion or focal pathologic process. Soft tissues and spinal canal: No prevertebral fluid or swelling. No visible canal hematoma. Disc levels: Mild-to-moderate multilevel degenerative disc and joint disease. Upper chest: Emphysematous changes are noted. Other: None. IMPRESSION: No acute fracture or traumatic subluxation of the cervical spine. Emphysema (ICD10-J43.9). Electronically Signed   By: Norman Hopper M.D.   On: 12/21/2023 17:18   DG Chest Portable 1 View Result Date: 12/21/2023 CLINICAL DATA:  Chest pain. EXAM: PORTABLE CHEST 1 VIEW COMPARISON:  Chest radiograph dated 04/25/2022. FINDINGS: The heart is at the upper limits of normal. Vascular calcifications are seen in the aortic arch. A left subclavian approach cardiac device is noted. The lungs are clear. The osseous structures are unremarkable. IMPRESSION: No active disease. Electronically Signed   By: Norman Hopper M.D.   On: 12/21/2023 17:13   CT KNEE LEFT WO CONTRAST Result Date: 12/21/2023 CLINICAL DATA:  Fracture, knee EXAM: CT OF THE LEFT KNEE WITHOUT CONTRAST TECHNIQUE: Multidetector CT imaging of the left knee was  performed according to the standard protocol. Multiplanar CT image reconstructions were also generated. RADIATION DOSE REDUCTION: This exam was performed according to the departmental dose-optimization program which includes automated exposure control, adjustment of the mA and/or kV according to patient size and/or use of iterative reconstruction technique. COMPARISON:  Same-day x-ray FINDINGS: Bones/Joint/Cartilage  Acute vertically oriented fracture of the distal femur with intra-articular extension through the lateral trochlea and through the medial aspect of the lateral femoral condyle. No significant articular surface depression or diastasis. Fracture extends vertically to the level of the distal femoral diaphysis, 8 cm above the level of the joint. Large layering knee joint lipohemarthrosis. No additional fractures. Joint spaces are relatively well preserved. Ligaments Suboptimally assessed by CT. Muscles and Tendons No acute musculotendinous abnormality by CT. Soft tissues Mild soft tissue swelling.  No hematoma. IMPRESSION: 1. Acute vertically-oriented fracture of the distal femur with intra-articular extension through the lateral trochlea and lateral femoral condyle. 2. Large layering knee joint lipohemarthrosis. Electronically Signed   By: Mabel Converse D.O.   On: 12/21/2023 16:50   CT HEAD WO CONTRAST ( ) Result Date: 12/21/2023 CLINICAL DATA:  Tripped and fall at home today with head injury. EXAM: CT HEAD WITHOUT CONTRAST TECHNIQUE: Contiguous axial images were obtained from the base of the skull through the vertex without intravenous contrast. RADIATION DOSE REDUCTION: This exam was performed according to the departmental dose-optimization program which includes automated exposure control, adjustment of the mA and/or kV according to patient size and/or use of iterative reconstruction technique. COMPARISON:  08/09/2022 FINDINGS: Brain: Ventricles, cisterns and other CSF spaces are normal. There is  mild chronic ischemic microvascular disease present. There is no mass, mass effect, shift of midline structures or acute hemorrhage. No evidence of acute infarction. Vascular: No hyperdense vessel or unexpected calcification. Skull: No acute fracture. Sinuses/Orbits: Paranasal sinuses are clear.  Orbits are normal. Other: None. IMPRESSION: 1. No acute findings. 2. Mild chronic ischemic microvascular disease. Electronically Signed   By: Toribio Agreste M.D.   On: 12/21/2023 16:19   DG Knee Complete 4 Views Left Result Date: 12/21/2023 CLINICAL DATA:  Post fall, now with left knee pain and swelling. EXAM: LEFT KNEE - COMPLETE 4+ VIEW COMPARISON:  None Available. FINDINGS: There is an acute obliquely oriented fracture involving the lateral aspect of the distal epiphysis of the femur with intra-articular extension and an expected lipohemarthrosis. Left knee joint spaces appear preserved. Minimal enthesopathic change involving the superior pole of the patella. Adjacent vascular calcifications. No radiopaque foreign body. IMPRESSION: Acute obliquely oriented fracture involving the lateral aspect of the distal epiphysis of the femur with intra-articular extension and an expected lipohemarthrosis. Electronically Signed   By: Norleen Roulette M.D.   On: 12/21/2023 15:52    EKG: Independently reviewed. A paced  Assessment/Plan Principal Problem:   Femur fracture (HCC) Active Problems:   Closed fracture of left distal femur (HCC)  (please populate well all problems here in Problem List. (For example, if patient is on BP meds at home and you resume or decide to hold them, it is a problem that needs to be her. Same for CAD, COPD, HLD and so on)  Left distal femur fracture -Secondary to mechanical fall -ORIF tomorrow.  Patient has a history of nonobstructive CAD and cardiomyopathy and chronic HFrEF and significantly chest pains today.  Although the nature of chest pain probably atypical.  Given his lifestyle is  sedentary, challenging to estimate his exercise tolerance as his activity mostly inside the house thus unclear whether he tolerates 4 METS activity. I texted on call cardiology Dr. Daleen to help us  to stratify his perioperative risk.  TTE ordered.  Will also follow-up second set of troponins. -Start low-dose of beta-blocker -Hold off chemical DVT prophylaxis tonight.  Chest pains -Appears to be atypical -Further workup plan as  above  Hypokalemia -PO replacement -K level in AM  Chronic HFrEF -Euvolemic, denies any shortness of breath and volume neutral at this point.  COPD -Stable, no acute concern  Mitral valve repair PPM -No acute concern.  DVT prophylaxis: Lovenoc Code Status: Full code Family Communication: Wife at bedside Disposition Plan: Patient is sick with femur fracture requiring ORIF, expect more than 2 midnight hospital stay Consults called: Cardiology, orthopedic surgery Admission status: Tele admit   Cort ONEIDA Mana MD [facility] Pager [phone]  12/21/2023, 6:23 PM

## 2023-12-22 ENCOUNTER — Other Ambulatory Visit: Payer: Self-pay

## 2023-12-22 ENCOUNTER — Inpatient Hospital Stay: Payer: Medicare HMO

## 2023-12-22 ENCOUNTER — Encounter
Admission: EM | Disposition: A | Discharge status: Home Health | Payer: Self-pay | Source: Home / Self Care | Attending: Internal Medicine

## 2023-12-22 ENCOUNTER — Inpatient Hospital Stay: Payer: Medicare HMO | Admitting: Anesthesiology

## 2023-12-22 ENCOUNTER — Inpatient Hospital Stay (HOSPITAL_COMMUNITY)
Admit: 2023-12-22 | Discharge: 2023-12-22 | Disposition: A | Discharge status: Home / Self Care | Payer: Medicare HMO | Attending: Internal Medicine | Admitting: Internal Medicine

## 2023-12-22 DIAGNOSIS — I4821 Permanent atrial fibrillation: Secondary | ICD-10-CM

## 2023-12-22 DIAGNOSIS — F419 Anxiety disorder, unspecified: Secondary | ICD-10-CM

## 2023-12-22 DIAGNOSIS — J439 Emphysema, unspecified: Secondary | ICD-10-CM

## 2023-12-22 DIAGNOSIS — I5022 Chronic systolic (congestive) heart failure: Secondary | ICD-10-CM | POA: Diagnosis not present

## 2023-12-22 DIAGNOSIS — R079 Chest pain, unspecified: Secondary | ICD-10-CM | POA: Diagnosis not present

## 2023-12-22 DIAGNOSIS — S72402A Unspecified fracture of lower end of left femur, initial encounter for closed fracture: Secondary | ICD-10-CM | POA: Diagnosis not present

## 2023-12-22 DIAGNOSIS — S72402S Unspecified fracture of lower end of left femur, sequela: Secondary | ICD-10-CM | POA: Diagnosis not present

## 2023-12-22 DIAGNOSIS — N1831 Chronic kidney disease, stage 3a: Secondary | ICD-10-CM

## 2023-12-22 DIAGNOSIS — Z01810 Encounter for preprocedural cardiovascular examination: Secondary | ICD-10-CM

## 2023-12-22 DIAGNOSIS — I428 Other cardiomyopathies: Secondary | ICD-10-CM

## 2023-12-22 DIAGNOSIS — E876 Hypokalemia: Secondary | ICD-10-CM

## 2023-12-22 DIAGNOSIS — D696 Thrombocytopenia, unspecified: Secondary | ICD-10-CM

## 2023-12-22 DIAGNOSIS — N183 Chronic kidney disease, stage 3 unspecified: Secondary | ICD-10-CM | POA: Insufficient documentation

## 2023-12-22 DIAGNOSIS — R0789 Other chest pain: Secondary | ICD-10-CM

## 2023-12-22 DIAGNOSIS — J449 Chronic obstructive pulmonary disease, unspecified: Secondary | ICD-10-CM | POA: Insufficient documentation

## 2023-12-22 HISTORY — PX: ORIF FEMUR FRACTURE: SHX2119

## 2023-12-22 LAB — ECHOCARDIOGRAM COMPLETE
AR max vel: 2.52 cm2
AV Area VTI: 2.61 cm2
AV Area mean vel: 2.65 cm2
AV Mean grad: 3 mm[Hg]
AV Peak grad: 5.4 mm[Hg]
Ao pk vel: 1.16 m/s
Area-P 1/2: 2.24 cm2
Calc EF: 51.6 %
Height: 72 in
MV VTI: 1.13 cm2
S' Lateral: 3.3 cm
Single Plane A2C EF: 53.5 %
Single Plane A4C EF: 47.9 %
Weight: 2828.94 [oz_av]

## 2023-12-22 LAB — POTASSIUM: Potassium: 3.5 mmol/L (ref 3.5–5.1)

## 2023-12-22 LAB — SURGICAL PCR SCREEN
MRSA, PCR: NEGATIVE
Staphylococcus aureus: NEGATIVE

## 2023-12-22 LAB — CBC
HCT: 38.2 % — ABNORMAL LOW (ref 39.0–52.0)
Hemoglobin: 12.9 g/dL — ABNORMAL LOW (ref 13.0–17.0)
MCH: 31.6 pg (ref 26.0–34.0)
MCHC: 33.8 g/dL (ref 30.0–36.0)
MCV: 93.6 fL (ref 80.0–100.0)
Platelets: 126 10*3/uL — ABNORMAL LOW (ref 150–400)
RBC: 4.08 MIL/uL — ABNORMAL LOW (ref 4.22–5.81)
RDW: 13 % (ref 11.5–15.5)
WBC: 4.5 10*3/uL (ref 4.0–10.5)
nRBC: 0 % (ref 0.0–0.2)

## 2023-12-22 SURGERY — OPEN REDUCTION INTERNAL FIXATION (ORIF) DISTAL FEMUR FRACTURE
Anesthesia: General | Site: Leg Upper | Laterality: Left

## 2023-12-22 MED ORDER — QUETIAPINE FUMARATE 25 MG PO TABS
25.0000 mg | ORAL_TABLET | Freq: Every day | ORAL | Status: DC
  Administered 2023-12-22 – 2023-12-24 (×3): 25 mg via ORAL
  Filled 2023-12-22 (×4): qty 1

## 2023-12-22 MED ORDER — 0.9 % SODIUM CHLORIDE (POUR BTL) OPTIME
TOPICAL | Status: DC | PRN
  Administered 2023-12-22: 500 mL

## 2023-12-22 MED ORDER — EPHEDRINE 5 MG/ML INJ
INTRAVENOUS | Status: AC
  Filled 2023-12-22: qty 5

## 2023-12-22 MED ORDER — FENTANYL CITRATE (PF) 100 MCG/2ML IJ SOLN
INTRAMUSCULAR | Status: AC
  Filled 2023-12-22: qty 2

## 2023-12-22 MED ORDER — OXYCODONE HCL 5 MG PO TABS: 5.0000 mg | ORAL_TABLET | Freq: Once | ORAL | Status: DC | PRN

## 2023-12-22 MED ORDER — ROCURONIUM BROMIDE 100 MG/10ML IV SOLN
INTRAVENOUS | Status: DC | PRN
  Administered 2023-12-22: 50 mg via INTRAVENOUS
  Administered 2023-12-22: 20 mg via INTRAVENOUS

## 2023-12-22 MED ORDER — HYDROMORPHONE HCL 1 MG/ML IJ SOLN
INTRAMUSCULAR | Status: AC
  Filled 2023-12-22: qty 1

## 2023-12-22 MED ORDER — DEXAMETHASONE SODIUM PHOSPHATE 10 MG/ML IJ SOLN
INTRAMUSCULAR | Status: DC | PRN
  Administered 2023-12-22: 5 mg via INTRAVENOUS

## 2023-12-22 MED ORDER — PHENYLEPHRINE 80 MCG/ML (10ML) SYRINGE FOR IV PUSH (FOR BLOOD PRESSURE SUPPORT)
PREFILLED_SYRINGE | INTRAVENOUS | Status: DC | PRN
  Administered 2023-12-22: 50 ug via INTRAVENOUS

## 2023-12-22 MED ORDER — OXYCODONE HCL 5 MG/5ML PO SOLN
5.0000 mg | Freq: Once | ORAL | Status: DC | PRN
Start: 2023-12-22 — End: 2023-12-22

## 2023-12-22 MED ORDER — FENTANYL CITRATE (PF) 100 MCG/2ML IJ SOLN
INTRAMUSCULAR | Status: DC | PRN
  Administered 2023-12-22 (×2): 50 ug via INTRAVENOUS

## 2023-12-22 MED ORDER — SEVOFLURANE IN SOLN
RESPIRATORY_TRACT | Status: AC
  Filled 2023-12-22: qty 250

## 2023-12-22 MED ORDER — PROPOFOL 10 MG/ML IV BOLUS
INTRAVENOUS | Status: DC | PRN
  Administered 2023-12-22: 110 mg via INTRAVENOUS

## 2023-12-22 MED ORDER — ASPIRIN 325 MG PO TABS
325.0000 mg | ORAL_TABLET | Freq: Every day | ORAL | Status: DC
  Administered 2023-12-22 – 2023-12-29 (×8): 325 mg via ORAL
  Filled 2023-12-22 (×9): qty 1

## 2023-12-22 MED ORDER — DEXAMETHASONE SODIUM PHOSPHATE 10 MG/ML IJ SOLN
INTRAMUSCULAR | Status: AC
  Filled 2023-12-22: qty 1

## 2023-12-22 MED ORDER — BUPIVACAINE HCL (PF) 0.5 % IJ SOLN
INTRAMUSCULAR | Status: AC
  Filled 2023-12-22: qty 30

## 2023-12-22 MED ORDER — EPHEDRINE SULFATE-NACL 50-0.9 MG/10ML-% IV SOSY
PREFILLED_SYRINGE | INTRAVENOUS | Status: DC | PRN
  Administered 2023-12-22: 5 mg via INTRAVENOUS

## 2023-12-22 MED ORDER — ADULT MULTIVITAMIN W/MINERALS CH
1.0000 | ORAL_TABLET | Freq: Every day | ORAL | Status: DC
  Administered 2023-12-23 – 2023-12-29 (×7): 1 via ORAL
  Filled 2023-12-22 (×7): qty 1

## 2023-12-22 MED ORDER — LIDOCAINE HCL (PF) 2 % IJ SOLN
INTRAMUSCULAR | Status: AC
  Filled 2023-12-22: qty 5

## 2023-12-22 MED ORDER — HYDROMORPHONE HCL 1 MG/ML IJ SOLN: 0.2500 mg | INTRAMUSCULAR | Status: DC | PRN

## 2023-12-22 MED ORDER — SERTRALINE HCL 50 MG PO TABS
25.0000 mg | ORAL_TABLET | Freq: Every day | ORAL | Status: DC
Start: 2023-12-23 — End: 2023-12-25
  Administered 2023-12-23 – 2023-12-25 (×3): 25 mg via ORAL
  Filled 2023-12-22 (×3): qty 1

## 2023-12-22 MED ORDER — LACTATED RINGERS IV SOLN: INTRAVENOUS | Status: DC | PRN

## 2023-12-22 MED ORDER — SUGAMMADEX SODIUM 200 MG/2ML IV SOLN
INTRAVENOUS | Status: DC | PRN
  Administered 2023-12-22: 200 mg via INTRAVENOUS

## 2023-12-22 MED ORDER — LIDOCAINE HCL (CARDIAC) PF 100 MG/5ML IV SOSY
PREFILLED_SYRINGE | INTRAVENOUS | Status: DC | PRN
  Administered 2023-12-22: 60 mg via INTRAVENOUS

## 2023-12-22 MED ORDER — ENSURE ENLIVE PO LIQD
237.0000 mL | Freq: Two times a day (BID) | ORAL | Status: DC
  Administered 2023-12-23 – 2023-12-29 (×13): 237 mL via ORAL

## 2023-12-22 MED ORDER — PHENYLEPHRINE 80 MCG/ML (10ML) SYRINGE FOR IV PUSH (FOR BLOOD PRESSURE SUPPORT)
PREFILLED_SYRINGE | INTRAVENOUS | Status: AC
  Filled 2023-12-22: qty 10

## 2023-12-22 MED ORDER — PROPOFOL 10 MG/ML IV BOLUS
INTRAVENOUS | Status: AC
  Filled 2023-12-22: qty 20

## 2023-12-22 MED ORDER — ONDANSETRON HCL 4 MG/2ML IJ SOLN
INTRAMUSCULAR | Status: AC
  Filled 2023-12-22: qty 2

## 2023-12-22 MED ORDER — ONDANSETRON HCL 4 MG/2ML IJ SOLN
INTRAMUSCULAR | Status: DC | PRN
  Administered 2023-12-22: 4 mg via INTRAVENOUS

## 2023-12-22 MED ORDER — HYDROMORPHONE HCL 1 MG/ML IJ SOLN
INTRAMUSCULAR | Status: DC | PRN
  Administered 2023-12-22 (×2): .5 mg via INTRAVENOUS

## 2023-12-22 SURGICAL SUPPLY — 43 items
BIT DRILL 4.3 (BIT) ×1
BIT DRILL 4.3X300MM (BIT) IMPLANT
BIT DRILL QC 3.3X195 (BIT) IMPLANT
BNDG COHESIVE 6X5 TAN ST LF (GAUZE/BANDAGES/DRESSINGS) ×1 IMPLANT
BNDG ELASTIC 6INX 5YD STR LF (GAUZE/BANDAGES/DRESSINGS) IMPLANT
BRACE KNEE POST OP SHORT (BRACE) IMPLANT
CAP LOCK NCB (Cap) IMPLANT
CHLORAPREP W/TINT 26 (MISCELLANEOUS) ×1 IMPLANT
DRAPE C-ARM XRAY 36X54 (DRAPES) ×1 IMPLANT
DRAPE C-ARMOR (DRAPES) ×1 IMPLANT
DRAPE INCISE IOBAN 66X60 STRL (DRAPES) ×2 IMPLANT
DRAPE SURG ORHT 6 SPLT 77X108 (DRAPES) ×2 IMPLANT
DRAPE TABLE BACK 80X90 (DRAPES) ×1 IMPLANT
DRAPE U-SHAPE 47X51 STRL (DRAPES) ×1 IMPLANT
DRSG OPSITE POSTOP 4X6 (GAUZE/BANDAGES/DRESSINGS) ×1 IMPLANT
DRSG OPSITE POSTOP 4X8 (GAUZE/BANDAGES/DRESSINGS) IMPLANT
ELECT REM PT RETURN 9FT ADLT (ELECTROSURGICAL) ×1 IMPLANT
ELECTRODE REM PT RTRN 9FT ADLT (ELECTROSURGICAL) ×1 IMPLANT
GAUZE 4X4 16PLY ~~LOC~~+RFID DBL (SPONGE) ×1 IMPLANT
GAUZE SPONGE 4X4 12PLY STRL (GAUZE/BANDAGES/DRESSINGS) ×1 IMPLANT
GAUZE XEROFORM 1X8 LF (GAUZE/BANDAGES/DRESSINGS) ×1 IMPLANT
GLOVE BIO SURGEON STRL SZ8 (GLOVE) ×2 IMPLANT
GLOVE INDICATOR 8.0 STRL GRN (GLOVE) ×1 IMPLANT
GOWN STRL REUS W/ TWL LRG LVL3 (GOWN DISPOSABLE) ×1 IMPLANT
GOWN STRL REUS W/ TWL XL LVL3 (GOWN DISPOSABLE) ×1 IMPLANT
HOLSTER ELECTROSUGICAL PENCIL (MISCELLANEOUS) ×1 IMPLANT
K-WIRE FXSTD 280X2XNS SS (WIRE) ×2 IMPLANT
KIT TURNOVER KIT A (KITS) ×1 IMPLANT
KWIRE FXSTD 280X2XNS SS (WIRE) IMPLANT
MANIFOLD NEPTUNE II (INSTRUMENTS) ×1 IMPLANT
NS IRRIG 1000ML POUR BTL (IV SOLUTION) ×1 IMPLANT
PACK HIP PROSTHESIS (MISCELLANEOUS) ×1 IMPLANT
PENCIL SMOKE EVACUATOR (MISCELLANEOUS) IMPLANT
PLATE NCB 5H LEFT (Plate) IMPLANT
SCREW CORT NCB SELFTAP 5.0X42 (Screw) IMPLANT
SCREW CORT NCB SELFTAP 5.0X50 (Screw) IMPLANT
SCREW NCB 5.0X85MM (Screw) IMPLANT
SPONGE T-LAP 18X18 ~~LOC~~+RFID (SPONGE) ×5 IMPLANT
STAPLER SKIN PROX 35W (STAPLE) ×1 IMPLANT
STOCKINETTE M/LG 89821 (MISCELLANEOUS) ×1 IMPLANT
SUT VIC AB 2-0 CT1 TAPERPNT 27 (SUTURE) ×2 IMPLANT
TRAP FLUID SMOKE EVACUATOR (MISCELLANEOUS) ×1 IMPLANT
WATER STERILE IRR 500ML POUR (IV SOLUTION) ×1 IMPLANT

## 2023-12-22 NOTE — Hospital Course (Addendum)
 Michael Delacruz is an 83 year old male with CAD, history of CVA, chronic ambulation dysfunction, chronic heart failure with reduced EF, status post mitral valve repair and pacemaker placement, COPD, who presented status post fall complaining of left knee pain.  He was found to have an obliquely oriented fracture involving the left aspect of the distal femur.  Orthopedic surgery was consulted.  On 1/2 patient underwent ORIF of distal femur fracture.  His postoperative course was complicated by metabolic encephalopathy which appeared to be secondary to pain medication.  Pain meds were de-escalated and his mental status improved to baseline.  Initially we had planned for rehab, however, while waiting for rehab placement patient improved significantly with physical therapy and is now discharging home with home health.  On day of discharge patient is in agreement with plan to go home.  I have also discussed his care plan extensively with his wife and who is ready to receive him home.    Closed fracture left distal femur - Status post ORIF 1/2 - Continue with oral pain meds, Tylenol  preferably. Tolerates occasional oxycodone , needing once daily. - TOC consulted for rehab placement initially, patient has now improved significantly with physical therapy and we are planning for home health with rolling walker.   - Aspirin  for DVT prophylaxis per ortho   Acute metabolic encephalopathy - Likely secondary to pain medications, anesthesia, acute hospital delirium - Discontinue all IV pain meds - Mental status improved, at baseline  Atypical chest pain - Echo: LVEF 50-55%, troponins negative.  Toprol  has been discontinued   Hypokalemia - Replace as needed   Chronic heart failure with mildly reduced EF - Clinically euvolemic - Prior echo EF 40 to 45%, echo here with EF improved to 55-50% -- PRN lasix  at home   Atrial fibrillation, permanent - Aspirin  for DVT prophylaxis as patient has previously refused  anticoagulation - Metoprolol  was started preoperatively the patient is not on this at home.  It has since been discontinued   Right eye pain - Chronic - Has been ongoing for 4 months outpatient - Case was discussed with ophthalmology here recommended for outpatient follow-up upon discharge - Patient's wife reports he is established with an ophthalmologist in [facility]   Mouth ulcer - Under upper denture - Magic mouthwash prior to eating - Plans to keep dentures out of his mouth while not eating   AKI superimposed on CKD stage II - Creatinine resolving to baseline now - Avoid nephrotoxic meds - Renally dose when needed   Thrombocytopenia - Trending upward now   Anxiety - Patient denies any home meds - Will add as needed   COPD, not currently in exacerbation - Currently on room air - Respiratory status stable - DuoNebs if needed

## 2023-12-22 NOTE — Plan of Care (Signed)

## 2023-12-22 NOTE — Op Note (Signed)
   Date of Surgery: 12/22/2023  INDICATIONS: Michael Delacruz is a 83 y.o.-year-old male with left distal femur fracture partial articular lateral femoral condyle.  The risk and benefits of the procedure were discussed in detail and documented in the pre-operative evaluation.   PREOPERATIVE DIAGNOSIS: 1.  Partial articular lateral femoral condyle fracture  POSTOPERATIVE DIAGNOSIS: Same.  PROCEDURE: 1.  Left distal femur open reduction internal fixation with plate and screws  SURGEON: Elspeth LITTIE Parker MD  ASSISTANT: Conley Funk, ATC  ANESTHESIA:  general  IV FLUIDS AND URINE: See anesthesia record.  ANTIBIOTICS: Ancef   ESTIMATED BLOOD LOSS: 100 mL.  IMPLANTS:  ZBT distal femoral plate  DRAINS: None  CULTURES: None  COMPLICATIONS: none  DESCRIPTION OF PROCEDURE:   The patient was identified in the preoperative holding area.  The correct site was marked according universal protocol nursing.  He is subsequently taken back to the operating room.  antibiotics were given 1 hour prior to skin incision.  He was moved over to the operating room table.  Anesthesia was induced.  He was prepped and draped in the usual sterile fashion with a bump under the left hip and a bone foam under the left leg.  Final timeout was performed.  At this time a lateral approach was made to the distal femur.  15 blade was used to incise through skin.  Hemostasis was achieved using electrocautery.  15 blade was used to incise through IT band.  The vastus lateralis was elevated using a Cobb.  The fracture was identified.  This was provisionally reduced using a clamp.  A distal femoral plate of the shortest 5 full variety was selected.  This was placed and confirmed on the AP and lateral views in terms of placement.  This was locked into place with 2 provisional K wires.  At this time the buttress hole of the plate was utilized.  The proximal to cortical screws were then placed and these were all subsequently tightened to  get the plate down to bone and to secure the fracture.  This time 3 distal screws were placed under direct fluoroscopic visualization.  Locking caps were applied over these given the quality of the bone.  The final reduction and plate placement was confirmed on AP and lateral fluoroscopy.  The wound was thoroughly irrigated.  The wound was closed in layers of 0 Vicryl 2-0 Vicryl and staples.  There counts were correct at the end of the case without complications.  Dressing was applied with Xeroform web roll gauze and Ace wrap.  He was placed in knee immobilizer.    POSTOPERATIVE PLAN: He will be weightbearing as tolerated on the left leg.  He will be in a knee immobilizer during ambulation.  He may come out of this at night or for hygiene.  He will be seen postop for physical therapy.  He will be placed on aspirin  for blood clot prevention  Elspeth LITTIE Parker, MD 12:20 PM

## 2023-12-22 NOTE — Progress Notes (Signed)
  Progress Note   Patient: Michael Delacruz FMW:[id number] DOB: October 27, 1941 DOA: 12/21/2023     1 DOS: the patient was seen and examined on 12/22/2023   Brief hospital course: 83 year old man past medical history of CAD, stroke, chronic ambulation dysfunction, chronic heart failure reduced ejection fraction, mitral valve repair, pacemaker, COPD presented with a fall and left knee pain.  Patient was found to have an obliquely oriented fracture involving the lateral aspect of the distal left femur.  1/2.  Patient seen prior to operation.  He does have occasional chest pains but none when I saw him.  No shortness of breath.  Had a mechanical fall.  Assessment and Plan: * Closed fracture of left distal femur (HCC) Operating room for open reduction internal fixation of distal femur fracture today.  Pain control  Atypical chest pain Atypical nature.  Echocardiogram showed an EF of 50%.  Troponins were negative.  No contraindications to surgery.  Hypokalemia Replaced  Chronic heart failure with mildly reduced ejection fraction (HFmrEF, 41-49%) (HCC) Patient euvolemic.  EF improved to 50%.  Permanent atrial fibrillation (HCC) Does not appear to be on any blood thinners.  Thrombocytopenia (HCC) Platelet count 126  CKD (chronic kidney disease), stage III (HCC) CKD stage IIIa with creatinine 1.24 and a GFR 59  Anxiety Continue Zoloft  and Seroquel   COPD (chronic obstructive pulmonary disease) (HCC) Respiratory status stable        Subjective: Patient cannot move his left leg this morning.  Came in after a fall and found to have a distal femur fracture.  Patient able to wiggle his toes.  No current chest pain.  Physical Exam: Vitals:   12/22/23 0455 12/22/23 0500 12/22/23 0942 12/22/23 1107  BP: 129/79  (!) 140/96 117/78  Pulse: 83  83 85  Resp: 16  16 16   Temp: 98.8 F (37.1 C)  98.2 F (36.8 C) 98.6 F (37 C)  TempSrc: Oral  Oral Temporal  SpO2: 98%  96% 96%  Weight:  80.2 kg     Height:       Physical Exam HENT:     Head: Normocephalic.     Mouth/Throat:     Pharynx: No oropharyngeal exudate.  Eyes:     General: Lids are normal.     Conjunctiva/sclera: Conjunctivae normal.  Cardiovascular:     Rate and Rhythm: Normal rate and regular rhythm.     Heart sounds: Normal heart sounds, S1 normal and S2 normal.  Pulmonary:     Breath sounds: No decreased breath sounds, wheezing, rhonchi or rales.  Abdominal:     Palpations: Abdomen is soft.     Tenderness: There is no abdominal tenderness.  Musculoskeletal:     Right lower leg: No swelling.     Left lower leg: No swelling.  Skin:    General: Skin is warm.     Findings: No rash.  Neurological:     Mental Status: He is alert and oriented to person, place, and time.     Data Reviewed: Potassium 3.5 creatinine 1.24  Family Communication: As per orthopedic surgery since going to the operating room today  Disposition: Status is: Inpatient Remains inpatient appropriate because: Operating room today  Planned Discharge Destination: Rehab    Time spent: 28 minutes  Author: Charlie Patterson, MD 12/22/2023 11:36 AM  For on call review [email or web address].

## 2023-12-22 NOTE — Assessment & Plan Note (Addendum)
 Platelet count up to 164

## 2023-12-22 NOTE — Assessment & Plan Note (Addendum)
 Aspirin added for DVT prophylaxis.  Patient did not want anticoagulation previously.  Also got rid of Toprol which was started preop.

## 2023-12-22 NOTE — Anesthesia Procedure Notes (Signed)
 Procedure Name: Intubation Date/Time: 12/22/2023 11:30 AM  Performed by: Niki Manus SAUNDERS, CRNAPre-anesthesia Checklist: Patient identified, Patient being monitored, Timeout performed, Emergency Drugs available and Suction available Patient Re-evaluated:Patient Re-evaluated prior to induction Oxygen Delivery Method: Circle system utilized Preoxygenation: Pre-oxygenation with 100% oxygen Induction Type: IV induction Ventilation: Mask ventilation without difficulty Laryngoscope Size: Mac and 4 Grade View: Grade I Tube type: Oral Tube size: 7.5 mm Number of attempts: 1 Airway Equipment and Method: Stylet Placement Confirmation: ETT inserted through vocal cords under direct vision, positive ETCO2 and breath sounds checked- equal and bilateral Secured at: 22 cm Tube secured with: Tape Dental Injury: Teeth and Oropharynx as per pre-operative assessment

## 2023-12-22 NOTE — Transfer of Care (Signed)
 Immediate Anesthesia Transfer of Care Note  Patient: Michael Delacruz  Procedure(s) Performed: OPEN REDUCTION INTERNAL FIXATION (ORIF) DISTAL FEMUR FRACTURE (Left: Leg Upper)  Patient Location: PACU  Anesthesia Type:General  Level of Consciousness: awake and alert   Airway & Oxygen Therapy: Patient Spontanous Breathing and Patient connected to face mask oxygen  Post-op Assessment: Report given to RN and Post -op Vital signs reviewed and stable  Post vital signs: Reviewed and stable  Last Vitals:  Vitals Value Taken Time  BP 141/77 12/22/23 1238  Temp 36.7 C 12/22/23 1238  Pulse 84 12/22/23 1242  Resp 22 12/22/23 1242  SpO2 99 % 12/22/23 1242  Vitals shown include unfiled device data.  Last Pain:  Vitals:   12/22/23 1238  TempSrc:   PainSc: Asleep      Patients Stated Pain Goal: 0 (12/21/23 2226)  Complications: No notable events documented.

## 2023-12-22 NOTE — Progress Notes (Signed)
 Initial Nutrition Assessment  DOCUMENTATION CODES:   Not applicable  INTERVENTION:   -Once diet is advanced, add:   -Ensure Enlive po BID, each supplement provides 350 kcal and 20 grams of protein.  -MVI with minerals daily  NUTRITION DIAGNOSIS:   Increased nutrient needs related to post-op healing as evidenced by estimated needs.  GOAL:   Patient will meet greater than or equal to 90% of their needs  MONITOR:   PO intake, Supplement acceptance, Diet advancement  REASON FOR ASSESSMENT:   Consult Assessment of nutrition requirement/status, Hip fracture protocol  ASSESSMENT:   Pt with medical history significant of non obstructive CAD 2016, stroke with chronic ambulation dysfunction, chronic HFrEF with LVEF 45-50%, mitral valve repair, PPM, COPD, presented with fall and left knee pain.  Pt admitted with lt distal femur fracture.   Reviewed I/O's: -80 ml x 24 hours  UOP: 200 ml x 24 hours  Pt unavailable at time of visit. Pt down in OR at time of visit. No family available to provide additional history. RD unable to obtain further nutrition-related history or complete nutrition-focused physical exam at this time.    Pt currently NPO for surgery today. Per orthopedics notes, plan for ORIF.   Per H&P, pt from SNF (unsure of facility).  Reviewed wt hx; wt has been stable over the past 11 months.   Pt with increased nutritional needs for post-operative healing and would benefit from addition of oral nutrition supplements.   Medications reviewed and include potassium chloride .   Labs reviewed: CBGS: 128 (inpatient orders for glycemic control are none).    Diet Order:   Diet Order             Diet NPO time specified  Diet effective midnight                   EDUCATION NEEDS:   No education needs have been identified at this time  Skin:  Skin Assessment: Reviewed RN Assessment  Last BM:  Unknown  Height:   Ht Readings from Last 1 Encounters:   12/21/23 6' (1.829 m)    Weight:   Wt Readings from Last 1 Encounters:  12/22/23 80.2 kg    Ideal Body Weight:  80.9 kg  BMI:  Body mass index is 23.98 kg/m.  Estimated Nutritional Needs:   Kcal:  1800-2000  Protein:  90-105 grams  Fluid:  > 1.8 l    Margery ORN, RD, LDN, CDCES Registered Dietitian III Certified Diabetes Care and Education Specialist If unable to reach this RD, please use RD Inpatient group chat on secure chat between hours of 8am-4 pm daily

## 2023-12-22 NOTE — Anesthesia Postprocedure Evaluation (Signed)
 Anesthesia Post Note  Patient: Michael Delacruz  Procedure(s) Performed: OPEN REDUCTION INTERNAL FIXATION (ORIF) DISTAL FEMUR FRACTURE (Left: Leg Upper)  Patient location during evaluation: PACU Anesthesia Type: General Level of consciousness: awake and alert Pain management: pain level controlled Vital Signs Assessment: post-procedure vital signs reviewed and stable Respiratory status: spontaneous breathing, nonlabored ventilation, respiratory function stable and patient connected to nasal cannula oxygen Cardiovascular status: blood pressure returned to baseline and stable Postop Assessment: no apparent nausea or vomiting Anesthetic complications: no   No notable events documented.   Last Vitals:  Vitals:   12/22/23 1310 12/22/23 1315  BP:    Pulse: 80 80  Resp: 16 16  Temp:    SpO2: 96% 94%    Last Pain:  Vitals:   12/22/23 1250  TempSrc:   PainSc: 0-No pain                 Lendia LITTIE Mae

## 2023-12-22 NOTE — Assessment & Plan Note (Addendum)
 Patient euvolemic.  EF improved to 50%.

## 2023-12-22 NOTE — H&P (Signed)
 ORTHOPAEDIC CONSULTATION  REQUESTING PHYSICIAN: Josette Ade, MD  Chief Complaint: Left distal femoral fracture  HPI: Michael Delacruz is a 83 y.o. male who presents with presents with a left nondisplaced lateral femoral condyle fracture after a fall and direct impact onto this left knee.  He does have a history of coronary artery disease status post mitral valve repair.  He is not on any anticoagulation.  He has not been able to place weight on the left knee.  He is very active without any type of assistive devices.  He does have a history of a stroke with blindness in the right eye Past Medical History:  Diagnosis Date   Anxiety    Arthritis    Ascending aortic aneurysm (HCC)    a. 11/2018 Echo: Ao root 43mm, Asc Ao 41mm; b. 02/2021 Echo: Ao root 47mm; c. 04/2021 CT chest: Asc Ao 4.5cm.   Asthma    Bradycardia    CAD in native artery    a. LHC 09/2015: 40% pCx, 35% mRCA.   Chronic HFrEF (heart failure with reduced ejection fraction) (HCC)    COPD (chronic obstructive pulmonary disease) (HCC)    Dilation of intestine 01/2015   Fibromyalgia    Frequent headaches    GERD (gastroesophageal reflux disease)    History of blood clots    eye    History of hiatal hernia    History of kidney stones    History of rheumatic fever    Hypertension    NICM (nonischemic cardiomyopathy) (HCC)    a. 07/2017 Echo: EF reduced to 40-45%; b. 07/2018 Echo: EF 25-30%; c. 11/2018 Echo: EF 30-35%; d. 02/2021 Echo: EF 45-50%, septal and apical septal HK. Mild MR. Sev dil LA. Mod dil RA. Ao root 47mm.   Permanent atrial fibrillation (HCC)    a. s/p TEE/DCCV 07/2015. b. H/o bleeding on Coumadin  when INR >5, changed to Eliquis -subsequently discontinued; c. 04/2018 s/p SJM 3562 Quadra Allure MP DC PPM & AVN ablation.   S/P Minimally invasive maze operation for atrial fibrillation 10/22/2015   Complete bilateral atrial lesion set using cryothermy and bipolar radiofrequency ablation with clipping of LA  appendage via right mini thoracotomy approach   S/P minimally invasive mitral valve repair 10/22/2015   Complex valvuloplasty including triangular resection of posterior leaflet, artificial Gore-tex neochord placement x6 and 38 mm Sorin Memo 3D Rechord ring annuloplasty via right minithoracotomy approach   Severe mitral regurgitation s/p MVR    a. s/p MV repair 10/2015; b. 11/2018 Echo: Mild MS (mean grad ); d. 02/2021 Echo: Mild MR. Mean grad .   Sleep apnea    no longer uses cpap and doesn't use O2 at home   Stroke [facility])     no vision in right, on occasion sees a pinpoint    Thyroid  disorder    TIA (transient ischemic attack)    Tobacco abuse    Past Surgical History:  Procedure Laterality Date   ANKLE SURGERY     AV NODE ABLATION N/A 05/04/2018   Procedure: AV NODE ABLATION;  Surgeon: Kelsie Agent, MD;  Location: [facility] INVASIVE CV LAB;  Service: Cardiovascular;  Laterality: N/A;   BIV PACEMAKER INSERTION CRT-P N/A 05/03/2018   Procedure: BIV PACEMAKER INSERTION CRT-P;  Surgeon: Fernande Elspeth BROCKS, MD;  Location: [facility] INVASIVE CV LAB;  Service: Cardiovascular;  Laterality: N/A;   CARDIAC CATHETERIZATION N/A 10/03/2015   Procedure: Right and Left Heart Cath and Coronary Angiography;  Surgeon: Evalene JINNY Lunger, MD;  Location:  [facility] INVASIVE CV LAB;  Service: Cardiovascular;  Laterality: N/A;   COLONOSCOPY     ELECTROPHYSIOLOGIC STUDY N/A 08/18/2015   Procedure: CARDIOVERSION;  Surgeon: Deatrice DELENA Cage, MD;  Location: [facility] ORS;  Service: Cardiovascular;  Laterality: N/A;   MASS EXCISION N/A 03/04/2021   Procedure: EXCISION INNER NASAL CANCER;  Surgeon: Ethyl Lonni BRAVO, MD;  Location: [facility] OR;  Service: ENT;  Laterality: N/A;   MINIMALLY INVASIVE MAZE PROCEDURE N/A 10/22/2015   Procedure: MINIMALLY INVASIVE MAZE PROCEDURE;  Surgeon: Sudie VEAR Laine, MD;  Location: [facility] OR;  Service: Open Heart Surgery;  Laterality: N/A;   MITRAL VALVE REPAIR Right 10/22/2015   Procedure: MINIMALLY INVASIVE  MITRAL VALVE REPAIR (MVR);  Surgeon: Sudie VEAR Laine, MD;  Location: [facility] OR;  Service: Open Heart Surgery;  Laterality: Right;   SINUS EXPLORATION     SKIN FULL THICKNESS GRAFT N/A 03/04/2021   Procedure: SKIN GRAFT FULL THICKNESS;  Surgeon: Ethyl Lonni BRAVO, MD;  Location: [facility] OR;  Service: ENT;  Laterality: N/A;   TEE WITHOUT CARDIOVERSION N/A 08/18/2015   Procedure: TRANSESOPHAGEAL ECHOCARDIOGRAM (TEE);  Surgeon: Deatrice DELENA Cage, MD;  Location: [facility] ORS;  Service: Cardiovascular;  Laterality: N/A;   TEE WITHOUT CARDIOVERSION N/A 10/22/2015   Procedure: TRANSESOPHAGEAL ECHOCARDIOGRAM (TEE);  Surgeon: Sudie VEAR Laine, MD;  Location: [facility] OR;  Service: Open Heart Surgery;  Laterality: N/A;   Social History   Socioeconomic History   Marital status: Married    Spouse name: [name]   Number of children: 3   Years of education: GED   Highest education level: GED or equivalent  Occupational History   Not on file  Tobacco Use   Smoking status: Every Day    Current packs/day: 0.50    Average packs/day: 0.5 packs/day for 65.0 years (32.5 ttl pk-yrs)    Types: Cigarettes    Start date: 1960   Smokeless tobacco: Never   Tobacco comments:    Resumed smoking, after quit 01/2018  Vaping Use   Vaping status: Never Used  Substance and Sexual Activity   Alcohol use: Not Currently    Alcohol/week: 1.0 standard drink of alcohol    Types: 1 Standard drinks or equivalent per week    Comment: occasionally drinks a margarita   Drug use: No   Sexual activity: Not on file  Other Topics Concern   Not on file  Social History Narrative   Right handed    2-3 cups coffee per day         Social Drivers of Health   Financial Resource Strain: Medium Risk (10/26/2023)   Overall Financial Resource Strain (CARDIA)    Difficulty of Paying Living Expenses: Somewhat hard  Food Insecurity: Food Insecurity Present (12/21/2023)   Hunger Vital Sign    Worried About Running Out of Food in the Last Year:  Sometimes true    Ran Out of Food in the Last Year: Sometimes true  Transportation Needs: No Transportation Needs (12/21/2023)   PRAPARE - Administrator, Civil Service (Medical): No    Lack of Transportation (Non-Medical): No  Physical Activity: Inactive (01/28/2023)   Exercise Vital Sign    Days of Exercise per Week: 0 days    Minutes of Exercise per Session: 0 min  Stress: No Stress Concern Present (01/28/2023)   [employer] of Occupational Health - Occupational Stress Questionnaire    Feeling of Stress : Only a little  Social Connections: Moderately Isolated (12/21/2023)   Social Connection and Isolation Panel [  NHANES]    Frequency of Communication with Friends and Family: More than three times a week    Frequency of Social Gatherings with Friends and Family: More than three times a week    Attends Religious Services: Never    Database Administrator or Organizations: No    Attends Engineer, Structural: Never    Marital Status: Married   Family History  Problem Relation Age of Onset   Stroke Mother    Irregular heart beat Mother    Heart murmur Brother    Pulmonary embolism Brother    Hypertension Other    Pulmonary embolism Maternal Uncle    - negative except otherwise stated in the family history section Allergies  Allergen Reactions   Codeine Nausea Only   Digoxin  And Related     SOB, bad dreams   Macrodantin [Nitrofurantoin Macrocrystal] Rash   Morphine  And Codeine Rash   Prior to Admission medications   Medication Sig Start Date End Date Taking? Authorizing Provider  albuterol  (VENTOLIN  HFA) 108 (90 Base) MCG/ACT inhaler Inhale 2 puffs into the lungs every 6 (six) hours as needed for wheezing or shortness of breath. 04/15/23  Yes Baity, Angeline ORN, NP  benzonatate  (TESSALON ) 200 MG capsule Take 1 capsule (200 mg total) by mouth 3 (three) times daily as needed. Patient not taking: Reported on 12/21/2023 04/15/23   Antonette Angeline ORN, NP  furosemide  (LASIX )  40 MG tablet Take 1 tablet (40 mg total) by mouth daily as needed (As needed for weight gain or shortness of breath). Patient not taking: Reported on 12/21/2023 02/09/21   Vivienne Lonni Ingle, NP  ipratropium (ATROVENT ) 0.06 % nasal spray Place 2 sprays into both nostrils 4 (four) times daily. As needed Patient not taking: Reported on 10/25/2023 02/06/21   Edman Marsa PARAS, DO  QUEtiapine  (SEROQUEL ) 25 MG tablet TAKE 1 TABLET BY MOUTH EVERYDAY AT BEDTIME Patient not taking: Reported on 10/25/2023 01/05/23   Edman Marsa PARAS, DO  sertraline  (ZOLOFT ) 25 MG tablet TAKE 1 TABLET (25 MG TOTAL) BY MOUTH DAILY. Patient not taking: Reported on 10/25/2023 04/16/22   Edman Marsa PARAS, DO   CT Cervical Spine Wo Contrast Result Date: 12/21/2023 CLINICAL DATA:  Fall from standing with neck pain. EXAM: CT CERVICAL SPINE WITHOUT CONTRAST TECHNIQUE: Multidetector CT imaging of the cervical spine was performed without intravenous contrast. Multiplanar CT image reconstructions were also generated. RADIATION DOSE REDUCTION: This exam was performed according to the departmental dose-optimization program which includes automated exposure control, adjustment of the mA and/or kV according to patient size and/or use of iterative reconstruction technique. COMPARISON:  MR cervical spine dated 04/13/2007. FINDINGS: Alignment: Normal. Skull base and vertebrae: No acute fracture. No primary bone lesion or focal pathologic process. Soft tissues and spinal canal: No prevertebral fluid or swelling. No visible canal hematoma. Disc levels: Mild-to-moderate multilevel degenerative disc and joint disease. Upper chest: Emphysematous changes are noted. Other: None. IMPRESSION: No acute fracture or traumatic subluxation of the cervical spine. Emphysema (ICD10-J43.9). Electronically Signed   By: Norman Hopper M.D.   On: 12/21/2023 17:18   DG Chest Portable 1 View Result Date: 12/21/2023 CLINICAL DATA:  Chest pain. EXAM:  PORTABLE CHEST 1 VIEW COMPARISON:  Chest radiograph dated 04/25/2022. FINDINGS: The heart is at the upper limits of normal. Vascular calcifications are seen in the aortic arch. A left subclavian approach cardiac device is noted. The lungs are clear. The osseous structures are unremarkable. IMPRESSION: No active disease. Electronically Signed  By: Norman Hopper M.D.   On: 12/21/2023 17:13   CT KNEE LEFT WO CONTRAST Result Date: 12/21/2023 CLINICAL DATA:  Fracture, knee EXAM: CT OF THE LEFT KNEE WITHOUT CONTRAST TECHNIQUE: Multidetector CT imaging of the left knee was performed according to the standard protocol. Multiplanar CT image reconstructions were also generated. RADIATION DOSE REDUCTION: This exam was performed according to the departmental dose-optimization program which includes automated exposure control, adjustment of the mA and/or kV according to patient size and/or use of iterative reconstruction technique. COMPARISON:  Same-day x-ray FINDINGS: Bones/Joint/Cartilage Acute vertically oriented fracture of the distal femur with intra-articular extension through the lateral trochlea and through the medial aspect of the lateral femoral condyle. No significant articular surface depression or diastasis. Fracture extends vertically to the level of the distal femoral diaphysis, 8 cm above the level of the joint. Large layering knee joint lipohemarthrosis. No additional fractures. Joint spaces are relatively well preserved. Ligaments Suboptimally assessed by CT. Muscles and Tendons No acute musculotendinous abnormality by CT. Soft tissues Mild soft tissue swelling.  No hematoma. IMPRESSION: 1. Acute vertically-oriented fracture of the distal femur with intra-articular extension through the lateral trochlea and lateral femoral condyle. 2. Large layering knee joint lipohemarthrosis. Electronically Signed   By: Mabel Converse D.O.   On: 12/21/2023 16:50   CT HEAD WO CONTRAST ( ) Result Date:  12/21/2023 CLINICAL DATA:  Tripped and fall at home today with head injury. EXAM: CT HEAD WITHOUT CONTRAST TECHNIQUE: Contiguous axial images were obtained from the base of the skull through the vertex without intravenous contrast. RADIATION DOSE REDUCTION: This exam was performed according to the departmental dose-optimization program which includes automated exposure control, adjustment of the mA and/or kV according to patient size and/or use of iterative reconstruction technique. COMPARISON:  08/09/2022 FINDINGS: Brain: Ventricles, cisterns and other CSF spaces are normal. There is mild chronic ischemic microvascular disease present. There is no mass, mass effect, shift of midline structures or acute hemorrhage. No evidence of acute infarction. Vascular: No hyperdense vessel or unexpected calcification. Skull: No acute fracture. Sinuses/Orbits: Paranasal sinuses are clear.  Orbits are normal. Other: None. IMPRESSION: 1. No acute findings. 2. Mild chronic ischemic microvascular disease. Electronically Signed   By: Toribio Agreste M.D.   On: 12/21/2023 16:19   DG Knee Complete 4 Views Left Result Date: 12/21/2023 CLINICAL DATA:  Post fall, now with left knee pain and swelling. EXAM: LEFT KNEE - COMPLETE 4+ VIEW COMPARISON:  None Available. FINDINGS: There is an acute obliquely oriented fracture involving the lateral aspect of the distal epiphysis of the femur with intra-articular extension and an expected lipohemarthrosis. Left knee joint spaces appear preserved. Minimal enthesopathic change involving the superior pole of the patella. Adjacent vascular calcifications. No radiopaque foreign body. IMPRESSION: Acute obliquely oriented fracture involving the lateral aspect of the distal epiphysis of the femur with intra-articular extension and an expected lipohemarthrosis. Electronically Signed   By: Norleen Roulette M.D.   On: 12/21/2023 15:52     Positive ROS: All other systems have been reviewed and were otherwise  negative with the exception of those mentioned in the HPI and as above.  Physical Exam: General: No acute distress Cardiovascular: No pedal edema Respiratory: No cyanosis, no use of accessory musculature GI: No organomegaly, abdomen is soft and non-tender Skin: No lesions in the area of chief complaint Neurologic: Sensation intact distally Psychiatric: Patient is at baseline mood and affect Lymphatic: No axillary or cervical lymphadenopathy  MUSCULOSKELETAL:  Left knee with tenderness palpation  about the lateral aspect of the femoral condyle.  Range of motion of the knee deferred.  Fires tibialis anterior as well as gastrocsoleus.  Sensation is intact all distributions of the left foot  Independent Imaging Review: X-ray 2 views left knee, CT scan left knee: Nondisplaced left femoral lateral condyle fracture  Assessment: 83 year old male with nondisplaced left lateral femoral condyle fracture after direct impact directly on the side.  I did describe that given the fact that this is intra-articular and a partial articular fashion that with any type of weightbearing I would be concerned about displacement.  To that effect I did discuss the notion of buttress plating laterally in order to allow him earlier weightbearing.  I did describe the risk and complication associate with the surgery.  I discussed the associated rehab times.  At this time after discussion he elected for left knee distal femur open reduction internal fixation  Plan: Plan for left knee distal femur open reduction internal fixation   After a lengthy discussion of treatment options, including risks, benefits, alternatives, complications of surgical and nonsurgical conservative options, the patient elected surgical repair.   The patient  is aware of the material risks  and complications including, but not limited to injury to adjacent structures, neurovascular injury, infection, numbness, bleeding, implant failure, thermal  burns, stiffness, persistent pain, failure to heal, disease transmission from allograft, need for further surgery, dislocation, anesthetic risks, blood clots, risks of death,and others. The probabilities of surgical success and failure discussed with patient given their particular co-morbidities.The time and nature of expected rehabilitation and recovery was discussed.The patient's questions were all answered preoperatively.  No barriers to understanding were noted. I explained the natural history of the disease process and Rx rationale.  I explained to the patient what I considered to be reasonable expectations given their personal situation.  The final treatment plan was arrived at through a shared patient decision making process model.   Thank you for the consult and the opportunity to see Mr. Jovin Fester, MD [facility] 8:36 AM

## 2023-12-22 NOTE — Discharge Instructions (Signed)
     Discharge Instructions    Attending Surgeon: Elspeth Parker, MD Office Phone Number: [phone]   Diagnosis and Procedures:    Surgeries Performed: Left distal femur open reduction internal fixation  Discharge Plan:    Diet: Resume usual diet. Begin with light or bland foods.  Drink plenty of fluids.  Activity:  Weight bearing as tolerated in knee immobilizer. You are advised to go home directly from the hospital or surgical center. Restrict your activities.  GENERAL INSTRUCTIONS: 1.  Please apply ice to your wound to help with swelling and inflammation. This will improve your comfort and your overall recovery following surgery.     2. Please call Dr. Danetta office at [phone] with questions Monday-Friday during business hours. If no one answers, please leave a message and someone should get back to the patient within 24 hours. For emergencies please call 911 or proceed to the emergency room.   3. Patient to notify surgical team if experiences any of the following: Bowel/Bladder dysfunction, uncontrolled pain, nerve/muscle weakness, incision with increased drainage or redness, nausea/vomiting and Fever greater than 101.0 F.  Be alert for signs of infection including redness, streaking, odor, fever or chills. Be alert for excessive pain or bleeding and notify your surgeon immediately.  WOUND INSTRUCTIONS:   Leave your dressing, cast, or splint in place until your post operative visit.  Keep it clean and dry.  Always keep the incision clean and dry until the staples/sutures are removed. If there is no drainage from the incision you should keep it open to air. If there is drainage from the incision you must keep it covered at all times until the drainage stops  Do not soak in a bath tub, hot tub, pool, lake or other body of water until 21 days after your surgery and your incision is completely dry and healed.  If you have removable sutures (or staples) they must be  removed 10-14 days (unless otherwise instructed) from the day of your surgery.     1)  Elevate the extremity as much as possible.  2)  Keep the dressing clean and dry.  3)  Please call us  if the dressing becomes wet or dirty.  4)  If you are experiencing worsening pain or worsening swelling, please call.

## 2023-12-22 NOTE — Plan of Care (Signed)
   Problem: Education: Goal: Knowledge of General Education information will improve Description: Including pain rating scale, medication(s)/side effects and non-pharmacologic comfort measures Outcome: Progressing   Problem: Nutrition: Goal: Adequate nutrition will be maintained Outcome: Progressing   Problem: Safety: Goal: Ability to remain free from injury will improve Outcome: Progressing

## 2023-12-22 NOTE — Assessment & Plan Note (Addendum)
 Patient does not take any medications at home.

## 2023-12-22 NOTE — Anesthesia Preprocedure Evaluation (Addendum)
 Anesthesia Evaluation  Patient identified by MRN, date of birth, ID band Patient awake    Reviewed: Allergy & Precautions, NPO status , Patient's Chart, lab work & pertinent test results  History of Anesthesia Complications Negative for: history of anesthetic complications  Airway Mallampati: III  TM Distance: >3 FB Neck ROM: full    Dental  (+) Upper Dentures, Lower Dentures   Pulmonary sleep apnea , COPD,  COPD inhaler, Current Smoker and Patient abstained from smoking.   Pulmonary exam normal        Cardiovascular hypertension, On Medications pulmonary hypertension+ CAD and +CHF  Normal cardiovascular exam+ dysrhythmias Atrial Fibrillation + pacemaker + Valvular Problems/Murmurs (s/p MVR) MR   EKG 1/1 Atrial flutter with predominant 3:1 AV block IVCD, consider atypical RBBB LVH with secondary repolarization abnormality  Echo 1/2 IMPRESSIONS     1. Left ventricular ejection fraction, by estimation, is 50 to 55%. The  left ventricle has low normal function. Left ventricular endocardial  border not optimally defined to evaluate regional wall motion. There is  moderate left ventricular hypertrophy.  Left ventricular diastolic parameters are indeterminate.   2. Right ventricular systolic function is normal. The right ventricular  size is mildly enlarged. Tricuspid regurgitation signal is inadequate for  assessing PA pressure.   3. Left atrial size was moderately dilated.   4. Right atrial size was mildly dilated.   5. The mitral valve has been repaired/replaced. Trivial mitral valve  regurgitation. No evidence of mitral stenosis. The mean mitral valve  gradient is 4.0 mmHg. There is a present in the mitral position.   6. The aortic valve is tricuspid. There is mild calcification of the  aortic valve. There is mild thickening of the aortic valve. Aortic valve  regurgitation is not visualized. Aortic valve  sclerosis/calcification is  present, without any evidence of  aortic stenosis.   7. Aortic dilatation noted. There is moderate dilatation of the aortic  root, measuring 45 mm. There is moderate dilatation of the aortic arch,  measuring 39 mm.     Neuro/Psych  Headaches PSYCHIATRIC DISORDERS Anxiety Depression    TIACVA    GI/Hepatic Neg liver ROS, hiatal hernia,GERD  ,,  Endo/Other  Hypothyroidism    Renal/GU      Musculoskeletal  (+) Arthritis ,  Fibromyalgia -  Abdominal   Peds  Hematology negative hematology ROS (+)   Anesthesia Other Findings Past Medical History: No date: Anxiety No date: Arthritis No date: Ascending aortic aneurysm (HCC)     Comment:  a. 11/2018 Echo: Ao root 43mm, Asc Ao 41mm; b. 02/2021               Echo: Ao root 47mm; c. 04/2021 CT chest: Asc Ao 4.5cm. No date: Asthma No date: Bradycardia No date: CAD in native artery     Comment:  a. LHC 09/2015: 40% pCx, 35% mRCA. No date: Chronic HFrEF (heart failure with reduced ejection fraction)  (HCC) No date: COPD (chronic obstructive pulmonary disease) (HCC) 01/2015: Dilation of intestine No date: Fibromyalgia No date: Frequent headaches No date: GERD (gastroesophageal reflux disease) No date: History of blood clots     Comment:  eye  No date: History of hiatal hernia No date: History of kidney stones No date: History of rheumatic fever No date: Hypertension No date: NICM (nonischemic cardiomyopathy) (HCC)     Comment:  a. 07/2017 Echo: EF reduced to 40-45%; b. 07/2018 Echo: EF  25-30%; c. 11/2018 Echo: EF 30-35%; d. 02/2021 Echo: EF               45-50%, septal and apical septal HK. Mild MR. Sev dil LA.              Mod dil RA. Ao root 47mm. No date: Permanent atrial fibrillation (HCC)     Comment:  a. s/p TEE/DCCV 07/2015. b. H/o bleeding on Coumadin  when              INR >5, changed to Eliquis -subsequently discontinued; c.               04/2018 s/p SJM 3562 Quadra Allure MP DC PPM  & AVN               ablation. 10/22/2015: S/P Minimally invasive maze operation for atrial  fibrillation     Comment:  Complete bilateral atrial lesion set using cryothermy               and bipolar radiofrequency ablation with clipping of LA               appendage via right mini thoracotomy approach 10/22/2015: S/P minimally invasive mitral valve repair     Comment:  Complex valvuloplasty including triangular resection of               posterior leaflet, artificial Gore-tex neochord placement              x6 and 38 mm Sorin Memo 3D Rechord ring annuloplasty via               right minithoracotomy approach No date: Severe mitral regurgitation s/p MVR     Comment:  a. s/p MV repair 10/2015; b. 11/2018 Echo: Mild MS (mean              grad ); d. 02/2021 Echo: Mild MR. Mean grad . No date: Sleep apnea     Comment:  no longer uses cpap and doesn't use O2 at home No date: Stroke [facility])     Comment:   no vision in right, on occasion sees a pinpoint  No date: Thyroid  disorder No date: TIA (transient ischemic attack) No date: Tobacco abuse  Past Surgical History: No date: ANKLE SURGERY 05/04/2018: AV NODE ABLATION; N/A     Comment:  Procedure: AV NODE ABLATION;  Surgeon: Kelsie Agent,               MD;  Location: [facility] INVASIVE CV LAB;  Service:               Cardiovascular;  Laterality: N/A; 05/03/2018: BIV PACEMAKER INSERTION CRT-P; N/A     Comment:  Procedure: BIV PACEMAKER INSERTION CRT-P;  Surgeon:               Fernande Elspeth BROCKS, MD;  Location: [facility] INVASIVE CV LAB;                Service: Cardiovascular;  Laterality: N/A; 10/03/2015: CARDIAC CATHETERIZATION; N/A     Comment:  Procedure: Right and Left Heart Cath and Coronary               Angiography;  Surgeon: Evalene JINNY Lunger, MD;  Location:               [facility] INVASIVE CV LAB;  Service: Cardiovascular;                Laterality: N/A; No date: COLONOSCOPY 08/18/2015: ELECTROPHYSIOLOGIC STUDY;  N/A     Comment:  Procedure:  CARDIOVERSION;  Surgeon: Deatrice DELENA Cage,               MD;  Location: [facility] ORS;  Service: Cardiovascular;                Laterality: N/A; 03/04/2021: MASS EXCISION; N/A     Comment:  Procedure: EXCISION INNER NASAL CANCER;  Surgeon:               Ethyl Lonni BRAVO, MD;  Location: [facility] OR;  Service:               ENT;  Laterality: N/A; 10/22/2015: MINIMALLY INVASIVE MAZE PROCEDURE; N/A     Comment:  Procedure: MINIMALLY INVASIVE MAZE PROCEDURE;  Surgeon:               Sudie VEAR Laine, MD;  Location: [facility] OR;  Service: Open               Heart Surgery;  Laterality: N/A; 10/22/2015: MITRAL VALVE REPAIR; Right     Comment:  Procedure: MINIMALLY INVASIVE MITRAL VALVE REPAIR (MVR);              Surgeon: Sudie VEAR Laine, MD;  Location: [facility] OR;  Service:              Open Heart Surgery;  Laterality: Right; No date: SINUS EXPLORATION 03/04/2021: SKIN FULL THICKNESS GRAFT; N/A     Comment:  Procedure: SKIN GRAFT FULL THICKNESS;  Surgeon: Ethyl Lonni BRAVO, MD;  Location: [facility] OR;  Service: ENT;                Laterality: N/A; 08/18/2015: TEE WITHOUT CARDIOVERSION; N/A     Comment:  Procedure: TRANSESOPHAGEAL ECHOCARDIOGRAM (TEE);                Surgeon: Deatrice DELENA Cage, MD;  Location: [facility] ORS;                Service: Cardiovascular;  Laterality: N/A; 10/22/2015: TEE WITHOUT CARDIOVERSION; N/A     Comment:  Procedure: TRANSESOPHAGEAL ECHOCARDIOGRAM (TEE);                Surgeon: Sudie VEAR Laine, MD;  Location: [facility] OR;  Service:              Open Heart Surgery;  Laterality: N/A;  BMI    Body Mass Index: 23.98 kg/m      Reproductive/Obstetrics negative OB ROS                             Anesthesia Physical Anesthesia Plan  ASA: 4  Anesthesia Plan: General ETT   Post-op Pain Management: Toradol  IV (intra-op)*, Ofirmev  IV (intra-op)* and Dilaudid  IV   Induction: Intravenous  PONV Risk Score and Plan: 2 and Ondansetron , Dexamethasone  and Treatment  may vary due to age or medical condition  Airway Management Planned: Oral ETT  Additional Equipment:   Intra-op Plan:   Post-operative Plan: Extubation in OR  Informed Consent: I have reviewed the patients History and Physical, chart, labs and discussed the procedure including the risks, benefits and alternatives for the proposed anesthesia with the patient or authorized representative who has indicated his/her understanding and acceptance.     Dental Advisory Given  Plan Discussed with: Anesthesiologist, CRNA and Surgeon  Anesthesia Plan Comments: (Patient consented  for risks of anesthesia including but not limited to:  - adverse reactions to medications - damage to eyes, teeth, lips or other oral mucosa - nerve damage due to positioning  - sore throat or hoarseness - Damage to heart, brain, nerves, lungs, other parts of body or loss of life  Patient voiced understanding and assent.)        Anesthesia Quick Evaluation

## 2023-12-22 NOTE — Brief Op Note (Signed)
   Brief Op Note  Date of Surgery: 12/22/2023  Preoperative Diagnosis: distal femur fracture  Postoperative Diagnosis: same  Procedure: Procedure(s): OPEN REDUCTION INTERNAL FIXATION (ORIF) DISTAL FEMUR FRACTURE  Implants: * No implants in log *  Surgeons: Surgeon(s): Genelle Standing, MD  Anesthesia: Choice    Estimated Blood Loss: See anesthesia record  Complications: None  Condition to PACU: Stable  Standing LITTIE Genelle, MD 12/22/2023 12:20 PM

## 2023-12-22 NOTE — Assessment & Plan Note (Addendum)
 Postoperative day 5 for open reduction internal fixation of distal femur fracture.  Oral pain medication, Tylenol.  Appreciate PT evaluation.  Will need rehab.  Aspirin for DVT prophylaxis.

## 2023-12-22 NOTE — Assessment & Plan Note (Signed)
 Replaced

## 2023-12-22 NOTE — Progress Notes (Signed)
*  PRELIMINARY RESULTS* Echocardiogram 2D Echocardiogram has been performed.  Carolyne Fiscal 12/22/2023, 9:52 AM

## 2023-12-22 NOTE — Progress Notes (Signed)
   12/21/23 2123  Vitals  Temp 99.5 F (37.5 C)  BP (!) 171/100  MAP (mmHg) 119  BP Location Right Arm  BP Method Automatic  Patient Position (if appropriate) Lying  Pulse Rate 81  Resp 17  MEWS COLOR  MEWS Score Color Green  Oxygen Therapy  SpO2 98 %  O2 Device Room Air  Height and Weight  Height 6' (1.829 m)  Weight 80.3 kg  Type of Scale Used Bed  BSA (Calculated - sq m) 2.02 sq meters  BMI (Calculated) 24  Weight in (lb) to have BMI = 25 183.9  MEWS Score  MEWS Temp 0  MEWS Systolic 0  MEWS Pulse 0  MEWS RR 0  MEWS LOC 0  MEWS Score 0   Patient admitted from ED via stretcher alert on room air.  Skin assessment completed with leg brace remaining in place (pts jeans and underwear cut off due to increased pain with movement).  Call bell education provided with call bell within reach.  Plan of care continues.

## 2023-12-22 NOTE — Assessment & Plan Note (Signed)
Respiratory status stable. 

## 2023-12-22 NOTE — Assessment & Plan Note (Addendum)
 Atypical nature.  Echocardiogram showed an EF of 50%.  Troponins were negative.  Discontinued Toprol.

## 2023-12-22 NOTE — Interval H&P Note (Signed)
 History and Physical Interval Note:  12/22/2023 10:57 AM  Michael Delacruz  has presented today for surgery, with the diagnosis of distal femur fracture.  The various methods of treatment have been discussed with the patient and family. After consideration of risks, benefits and other options for treatment, the patient has consented to  Procedure(s) with comments: OPEN REDUCTION INTERNAL FIXATION (ORIF) DISTAL FEMUR FRACTURE (Left) - NOT HIS CARD Zimmer distal plate requested- rep aware as a surgical intervention.  The patient's history has been reviewed, patient examined, no change in status, stable for surgery.  I have reviewed the patient's chart and labs.  Questions were answered to the patient's satisfaction.     [name]

## 2023-12-22 NOTE — Assessment & Plan Note (Deleted)
 CKD stage IIIa with creatinine 1.03 with a GFR greater than 60.

## 2023-12-22 NOTE — Consult Note (Signed)
 Cardiology Consultation   Patient ID: Michael Delacruz MRN: [id number]; DOB: Nov 06, 1941  Admit date: 12/21/2023 Date of Consult: 12/22/2023  PCP:  Edman Marsa PARAS, DO   [facility] Providers Cardiologist:  Evalene Lunger, MD   {   Patient Profile:   Michael Delacruz is a 83 y.o. male with a hx of tobacco use, severe MR s/p mitral valve repair (November 2016), moderate pulmonary HTN, permanent afib/flutter s/p permanent PPM and AV nodal ablation, nonischemic cardiomyopathy and HFrEF with an EF of 45-50%, retinal bleeding exacerbated by oral a/c requiring surgery x2 with residual right eye deficits, nonobstructive CAD and HTN who is being seen 12/22/2023 for the evaluation of pre-operative evaluation at the request of Dr. Josette.  History of Present Illness:   Michael Delacruz  follows with Dr. Gollan and Dr. Fernande, In the setting of progressive mitral valve disease, he underwent diagnostic catheterization in 2016 revealing moderate nonobstructive left Cx and RCA disease. He subsequently underwent successful mitral valve repair with surgical maze in the setting of a history of paroxysmal atrial fibrillation and flutter. He had previously been treated with amiodarone  which was discontinued secondary to nightmares, and oral a/c, which was discontinued secondary to retinal bleeding requiring 2 surgeries (pt has preferred to remain off OAC). In 2018, he was found to have LV dysfunction with an EF 40-45% in the setting of atrial flutter. This was difficult to rate and rhythm control and he underwent successful permanent pacemaker placement and AV nodal ablation in May 2019. EF was measured at 25-30% in August 2019 but improved to 30-35% by echo in December 2019. Echo in March 2022 showed improvement in LV function to 45-50% with mild MR and moderately dilated aortic root at 47 mm. HE subsequently underwent chest CT for pulmonary nodule screening and this showed ascending aortic aneurysm  measuring 4.5cm with recommendation for annual imaging.   The patient was last seen 10/2023 and was overall stable from a cardiac perspective. A/c was discussed, but deferred again.   The patient presented to [facility] 12/21/23 with a fall and left knee pain. He tripped over a toy and landed on his left knee. He hit his forehead on the right side. He did not pass out or feel lightheaded/dizzy prior to falling. He reports three very short brief episodes of chest pain HE denies recent fever, chills, nasuea or vomiting.  In the ER BP 138/88, 88bpm, RR 16, afebrile, 98% O2. Labs showed lat 126, K 3.4, BG 107. HS trop negative x 2. CT head and cervical spine were negative for acute process. CXR was unremarkable. Left knee x ray showed acute obliquely fracture involving the lateral aspect of the distal epiphysis of the femur with intra-articular extension and an expected lipohemarthrosis.CT left knee showed acue vertically oriented fracture of the distal femur with intra-articular extensive through the lateral trochlea and lateral femoral condyle.  He can walk up a flight of stairs. He uses light weights at home. Does not use a cane or walker. HE has chronic shortness of breath with moderate exertion. No exertional chest pain. He denies lower leg edema.   Past Medical History:  Diagnosis Date   Anxiety    Arthritis    Ascending aortic aneurysm (HCC)    a. 11/2018 Echo: Ao root 43mm, Asc Ao 41mm; b. 02/2021 Echo: Ao root 47mm; c. 04/2021 CT chest: Asc Ao 4.5cm.   Asthma    Bradycardia    CAD in native artery    a.  LHC 09/2015: 40% pCx, 35% mRCA.   Chronic HFrEF (heart failure with reduced ejection fraction) (HCC)    COPD (chronic obstructive pulmonary disease) (HCC)    Dilation of intestine 01/2015   Fibromyalgia    Frequent headaches    GERD (gastroesophageal reflux disease)    History of blood clots    eye    History of hiatal hernia    History of kidney stones    History of rheumatic fever     Hypertension    NICM (nonischemic cardiomyopathy) (HCC)    a. 07/2017 Echo: EF reduced to 40-45%; b. 07/2018 Echo: EF 25-30%; c. 11/2018 Echo: EF 30-35%; d. 02/2021 Echo: EF 45-50%, septal and apical septal HK. Mild MR. Sev dil LA. Mod dil RA. Ao root 47mm.   Permanent atrial fibrillation (HCC)    a. s/p TEE/DCCV 07/2015. b. H/o bleeding on Coumadin  when INR >5, changed to Eliquis -subsequently discontinued; c. 04/2018 s/p SJM 3562 Quadra Allure MP DC PPM & AVN ablation.   S/P Minimally invasive maze operation for atrial fibrillation 10/22/2015   Complete bilateral atrial lesion set using cryothermy and bipolar radiofrequency ablation with clipping of LA appendage via right mini thoracotomy approach   S/P minimally invasive mitral valve repair 10/22/2015   Complex valvuloplasty including triangular resection of posterior leaflet, artificial Gore-tex neochord placement x6 and 38 mm Sorin Memo 3D Rechord ring annuloplasty via right minithoracotomy approach   Severe mitral regurgitation s/p MVR    a. s/p MV repair 10/2015; b. 11/2018 Echo: Mild MS (mean grad ); d. 02/2021 Echo: Mild MR. Mean grad .   Sleep apnea    no longer uses cpap and doesn't use O2 at home   Stroke [facility])     no vision in right, on occasion sees a pinpoint    Thyroid  disorder    TIA (transient ischemic attack)    Tobacco abuse     Past Surgical History:  Procedure Laterality Date   ANKLE SURGERY     AV NODE ABLATION N/A 05/04/2018   Procedure: AV NODE ABLATION;  Surgeon: Kelsie Agent, MD;  Location: [facility] INVASIVE CV LAB;  Service: Cardiovascular;  Laterality: N/A;   BIV PACEMAKER INSERTION CRT-P N/A 05/03/2018   Procedure: BIV PACEMAKER INSERTION CRT-P;  Surgeon: Fernande Elspeth BROCKS, MD;  Location: [facility] INVASIVE CV LAB;  Service: Cardiovascular;  Laterality: N/A;   CARDIAC CATHETERIZATION N/A 10/03/2015   Procedure: Right and Left Heart Cath and Coronary Angiography;  Surgeon: Evalene JINNY Lunger, MD;  Location: [facility] INVASIVE CV  LAB;  Service: Cardiovascular;  Laterality: N/A;   COLONOSCOPY     ELECTROPHYSIOLOGIC STUDY N/A 08/18/2015   Procedure: CARDIOVERSION;  Surgeon: Deatrice DELENA Cage, MD;  Location: [facility] ORS;  Service: Cardiovascular;  Laterality: N/A;   MASS EXCISION N/A 03/04/2021   Procedure: EXCISION INNER NASAL CANCER;  Surgeon: Ethyl Lonni BRAVO, MD;  Location: [facility] OR;  Service: ENT;  Laterality: N/A;   MINIMALLY INVASIVE MAZE PROCEDURE N/A 10/22/2015   Procedure: MINIMALLY INVASIVE MAZE PROCEDURE;  Surgeon: Sudie VEAR Laine, MD;  Location: [facility] OR;  Service: Open Heart Surgery;  Laterality: N/A;   MITRAL VALVE REPAIR Right 10/22/2015   Procedure: MINIMALLY INVASIVE MITRAL VALVE REPAIR (MVR);  Surgeon: Sudie VEAR Laine, MD;  Location: [facility] OR;  Service: Open Heart Surgery;  Laterality: Right;   SINUS EXPLORATION     SKIN FULL THICKNESS GRAFT N/A 03/04/2021   Procedure: SKIN GRAFT FULL THICKNESS;  Surgeon: Ethyl Lonni BRAVO, MD;  Location: [facility] OR;  Service:  ENT;  Laterality: N/A;   TEE WITHOUT CARDIOVERSION N/A 08/18/2015   Procedure: TRANSESOPHAGEAL ECHOCARDIOGRAM (TEE);  Surgeon: Deatrice DELENA Cage, MD;  Location: [facility] ORS;  Service: Cardiovascular;  Laterality: N/A;   TEE WITHOUT CARDIOVERSION N/A 10/22/2015   Procedure: TRANSESOPHAGEAL ECHOCARDIOGRAM (TEE);  Surgeon: Sudie VEAR Laine, MD;  Location: [facility] OR;  Service: Open Heart Surgery;  Laterality: N/A;     Home Medications:  Prior to Admission medications   Medication Sig Start Date End Date Taking? Authorizing Provider  albuterol  (VENTOLIN  HFA) 108 (90 Base) MCG/ACT inhaler Inhale 2 puffs into the lungs every 6 (six) hours as needed for wheezing or shortness of breath. 04/15/23  Yes Baity, Angeline ORN, NP  benzonatate  (TESSALON ) 200 MG capsule Take 1 capsule (200 mg total) by mouth 3 (three) times daily as needed. Patient not taking: Reported on 12/21/2023 04/15/23   Antonette Angeline ORN, NP  furosemide  (LASIX ) 40 MG tablet Take 1 tablet (40 mg total) by mouth daily as  needed (As needed for weight gain or shortness of breath). Patient not taking: Reported on 12/21/2023 02/09/21   Vivienne Lonni Ingle, NP  ipratropium (ATROVENT ) 0.06 % nasal spray Place 2 sprays into both nostrils 4 (four) times daily. As needed Patient not taking: Reported on 10/25/2023 02/06/21   Edman Marsa PARAS, DO  QUEtiapine  (SEROQUEL ) 25 MG tablet TAKE 1 TABLET BY MOUTH EVERYDAY AT BEDTIME Patient not taking: Reported on 10/25/2023 01/05/23   Edman Marsa PARAS, DO  sertraline  (ZOLOFT ) 25 MG tablet TAKE 1 TABLET (25 MG TOTAL) BY MOUTH DAILY. Patient not taking: Reported on 10/25/2023 04/16/22   Edman Marsa PARAS, DO    Inpatient Medications: Scheduled Meds:  metoprolol  tartrate  25 mg Oral BID   potassium chloride   40 mEq Oral Once   Continuous Infusions:   ceFAZolin  (ANCEF ) IV     tranexamic acid      PRN Meds: albuterol , hydrALAZINE , HYDROmorphone  (DILAUDID ) injection, ondansetron  (ZOFRAN ) IV, oxyCODONE   Allergies:    Allergies  Allergen Reactions   Codeine Nausea Only   Digoxin  And Related     SOB, bad dreams   Macrodantin [Nitrofurantoin Macrocrystal] Rash   Morphine  And Codeine Rash    Social History:   Social History   Socioeconomic History   Marital status: Married    Spouse name: [name]   Number of children: 3   Years of education: GED   Highest education level: GED or equivalent  Occupational History   Not on file  Tobacco Use   Smoking status: Every Day    Current packs/day: 0.50    Average packs/day: 0.5 packs/day for 65.0 years (32.5 ttl pk-yrs)    Types: Cigarettes    Start date: 1960   Smokeless tobacco: Never   Tobacco comments:    Resumed smoking, after quit 01/2018  Vaping Use   Vaping status: Never Used  Substance and Sexual Activity   Alcohol use: Not Currently    Alcohol/week: 1.0 standard drink of alcohol    Types: 1 Standard drinks or equivalent per week    Comment: occasionally drinks a margarita   Drug use: No    Sexual activity: Not on file  Other Topics Concern   Not on file  Social History Narrative   Right handed    2-3 cups coffee per day         Social Drivers of Health   Financial Resource Strain: Medium Risk (10/26/2023)   Overall Financial Resource Strain (CARDIA)    Difficulty of Paying Living  Expenses: Somewhat hard  Food Insecurity: Food Insecurity Present (12/21/2023)   Hunger Vital Sign    Worried About Running Out of Food in the Last Year: Sometimes true    Ran Out of Food in the Last Year: Sometimes true  Transportation Needs: No Transportation Needs (12/21/2023)   PRAPARE - Administrator, Civil Service (Medical): No    Lack of Transportation (Non-Medical): No  Physical Activity: Inactive (01/28/2023)   Exercise Vital Sign    Days of Exercise per Week: 0 days    Minutes of Exercise per Session: 0 min  Stress: No Stress Concern Present (01/28/2023)   [employer] of Occupational Health - Occupational Stress Questionnaire    Feeling of Stress : Only a little  Social Connections: Moderately Isolated (12/21/2023)   Social Connection and Isolation Panel [NHANES]    Frequency of Communication with Friends and Family: More than three times a week    Frequency of Social Gatherings with Friends and Family: More than three times a week    Attends Religious Services: Never    Database Administrator or Organizations: No    Attends Banker Meetings: Never    Marital Status: Married  Catering Manager Violence: Not At Risk (12/21/2023)   Humiliation, Afraid, Rape, and Kick questionnaire    Fear of Current or Ex-Partner: No    Emotionally Abused: No    Physically Abused: No    Sexually Abused: No    Family History:    Family History  Problem Relation Age of Onset   Stroke Mother    Irregular heart beat Mother    Heart murmur Brother    Pulmonary embolism Brother    Hypertension Other    Pulmonary embolism Maternal Uncle      ROS:  Please see the  history of present illness.   All other ROS reviewed and negative.     Physical Exam/Data:   Vitals:   12/21/23 2123 12/22/23 0001 12/22/23 0455 12/22/23 0500  BP: (!) 171/100 (!) 158/86 129/79   Pulse: 81 87 83   Resp: 17 20 16    Temp: 99.5 F (37.5 C) 99.5 F (37.5 C) 98.8 F (37.1 C)   TempSrc:   Oral   SpO2: 98% 97% 98%   Weight: 80.3 kg   80.2 kg  Height: 6' (1.829 m)       Intake/Output Summary (Last 24 hours) at 12/22/2023 0731 Last data filed at 12/22/2023 9662 Gross per 24 hour  Intake 120 ml  Output 200 ml  Net -80 ml      12/22/2023    5:00 AM 12/21/2023    9:23 PM 10/25/2023   11:48 AM  Last 3 Weights  Weight (lbs) 176 lb 12.9 oz 177 lb 0.5 oz 183 lb 3.2 oz  Weight (kg) 80.2 kg 80.3 kg 83.099 kg     Body mass index is 23.98 kg/m.  General:  Well nourished, well developed, in no acute distress HEENT: normal Neck: no JVD Vascular: No carotid bruits; Distal pulses 2+ bilaterally Cardiac:  normal S1, S2; RRR; no murmur  Lungs:  clear to auscultation bilaterally, no wheezing, rhonchi or rales  Abd: soft, nontender, no hepatomegaly  Ext: no edema Musculoskeletal:  brace left knee Skin: warm and dry  Neuro:  CNs 2-12 intact, no focal abnormalities noted Psych:  Normal affect   EKG:  The EKG was personally reviewed and demonstrates:  V paced rhythm with underlying aflutter 81bpm, IVCD, TWI V1 and  V2 Telemetry:  Telemetry was personally reviewed and demonstrates:  N/A  Relevant CV Studies:  Echo 03/2022 1. Left ventricular ejection fraction, by estimation, is 45 to 50%. The  left ventricle has mildly decreased function. The left ventricle  demonstrates regional wall motion abnormalities (septal wall dyskinesis  likely from condution abnormality). There is   mild left ventricular hypertrophy. The average left ventricular global  longitudinal strain is -7.8 %. The global longitudinal strain is abnormal.   2. Right ventricular systolic function is normal. The  right ventricular  size is normal.   3. Left atrial size was severely dilated.   4. The mitral valve has been repaired/replaced. No evidence of mitral  valve regurgitation. Mild mitral stenosis. The mean mitral valve gradient  is 5.0 mmHg.   5. The aortic valve was not well visualized. Aortic valve regurgitation  is not visualized. No aortic stenosis is present.   6. There is moderate dilatation of the aortic root, measuring 45 mm.  There is mild dilatation of the ascending aorta, measuring 43 mm. There is  borderline dilatation of the aortic arch, measuring 39 mm.   7. The inferior vena cava is dilated in size with >50% respiratory  variability, suggesting right atrial pressure of 8 mmHg.   Echo 02/2021 1. Left ventricular ejection fraction, by estimation, is 45 to 50%. The  left ventricle has mildly decreased function. The left ventricle  demonstrates regional wall motion abnormalities (Septal and apical septal  hypokinesis, possibly secondary to  conduction abnormality). Left ventricular diastolic parameters are  indeterminate.   2. Right ventricular systolic function is normal. The right ventricular  size is normal.   3. The mitral valve was not well visualized. Mitral valve repair noted.  Mild mitral valve regurgitation. No evidence of mitral stenosis. The mean  mitral valve gradient is 5.0 mmHg.   4. Left atrial size was severely dilated.   5. Right atrial size was moderately dilated.   6. There is moderate dilatation of the aortic root, measuring 47 mm.  Ascending aorta not well visualized.   R/L heart cath 09/2015  Prox Cx lesion, 40% stenosed. Mid RCA lesion, 35% stenosed. The left ventricular systolic function is normal.   Laboratory Data:  High Sensitivity Troponin:   Recent Labs  Lab 12/21/23 1622 12/21/23 1813  TROPONINIHS 13 13     Chemistry Recent Labs  Lab 12/21/23 1622 12/22/23 0344  NA 139  --   K 3.4* 3.5  CL 103  --   CO2 24  --   GLUCOSE 107*   --   BUN 11  --   CREATININE 1.24  --   CALCIUM  8.8*  --   GFRNONAA 59*  --   ANIONGAP 12  --     No results for input(s): PROT, ALBUMIN , AST, ALT, ALKPHOS, BILITOT in the last 168 hours. Lipids No results for input(s): CHOL, TRIG, HDL, LABVLDL, LDLCALC, CHOLHDL in the last 168 hours.  Hematology Recent Labs  Lab 12/21/23 1622 12/22/23 0344  WBC 4.2 4.5  RBC 4.66 4.08*  HGB 14.5 12.9*  HCT 44.5 38.2*  MCV 95.5 93.6  MCH 31.1 31.6  MCHC 32.6 33.8  RDW 13.1 13.0  PLT 126* 126*   Thyroid  No results for input(s): TSH, FREET4 in the last 168 hours.  BNPNo results for input(s): BNP, PROBNP in the last 168 hours.  DDimer No results for input(s): DDIMER in the last 168 hours.   Radiology/Studies:  CT Cervical Spine Wo Contrast Result  Date: 12/21/2023 CLINICAL DATA:  Fall from standing with neck pain. EXAM: CT CERVICAL SPINE WITHOUT CONTRAST TECHNIQUE: Multidetector CT imaging of the cervical spine was performed without intravenous contrast. Multiplanar CT image reconstructions were also generated. RADIATION DOSE REDUCTION: This exam was performed according to the departmental dose-optimization program which includes automated exposure control, adjustment of the mA and/or kV according to patient size and/or use of iterative reconstruction technique. COMPARISON:  MR cervical spine dated 04/13/2007. FINDINGS: Alignment: Normal. Skull base and vertebrae: No acute fracture. No primary bone lesion or focal pathologic process. Soft tissues and spinal canal: No prevertebral fluid or swelling. No visible canal hematoma. Disc levels: Mild-to-moderate multilevel degenerative disc and joint disease. Upper chest: Emphysematous changes are noted. Other: None. IMPRESSION: No acute fracture or traumatic subluxation of the cervical spine. Emphysema (ICD10-J43.9). Electronically Signed   By: Norman Hopper M.D.   On: 12/21/2023 17:18   DG Chest Portable 1 View Result Date:  12/21/2023 CLINICAL DATA:  Chest pain. EXAM: PORTABLE CHEST 1 VIEW COMPARISON:  Chest radiograph dated 04/25/2022. FINDINGS: The heart is at the upper limits of normal. Vascular calcifications are seen in the aortic arch. A left subclavian approach cardiac device is noted. The lungs are clear. The osseous structures are unremarkable. IMPRESSION: No active disease. Electronically Signed   By: Norman Hopper M.D.   On: 12/21/2023 17:13   CT KNEE LEFT WO CONTRAST Result Date: 12/21/2023 CLINICAL DATA:  Fracture, knee EXAM: CT OF THE LEFT KNEE WITHOUT CONTRAST TECHNIQUE: Multidetector CT imaging of the left knee was performed according to the standard protocol. Multiplanar CT image reconstructions were also generated. RADIATION DOSE REDUCTION: This exam was performed according to the departmental dose-optimization program which includes automated exposure control, adjustment of the mA and/or kV according to patient size and/or use of iterative reconstruction technique. COMPARISON:  Same-day x-ray FINDINGS: Bones/Joint/Cartilage Acute vertically oriented fracture of the distal femur with intra-articular extension through the lateral trochlea and through the medial aspect of the lateral femoral condyle. No significant articular surface depression or diastasis. Fracture extends vertically to the level of the distal femoral diaphysis, 8 cm above the level of the joint. Large layering knee joint lipohemarthrosis. No additional fractures. Joint spaces are relatively well preserved. Ligaments Suboptimally assessed by CT. Muscles and Tendons No acute musculotendinous abnormality by CT. Soft tissues Mild soft tissue swelling.  No hematoma. IMPRESSION: 1. Acute vertically-oriented fracture of the distal femur with intra-articular extension through the lateral trochlea and lateral femoral condyle. 2. Large layering knee joint lipohemarthrosis. Electronically Signed   By: Mabel Converse D.O.   On: 12/21/2023 16:50   CT HEAD WO  CONTRAST ( ) Result Date: 12/21/2023 CLINICAL DATA:  Tripped and fall at home today with head injury. EXAM: CT HEAD WITHOUT CONTRAST TECHNIQUE: Contiguous axial images were obtained from the base of the skull through the vertex without intravenous contrast. RADIATION DOSE REDUCTION: This exam was performed according to the departmental dose-optimization program which includes automated exposure control, adjustment of the mA and/or kV according to patient size and/or use of iterative reconstruction technique. COMPARISON:  08/09/2022 FINDINGS: Brain: Ventricles, cisterns and other CSF spaces are normal. There is mild chronic ischemic microvascular disease present. There is no mass, mass effect, shift of midline structures or acute hemorrhage. No evidence of acute infarction. Vascular: No hyperdense vessel or unexpected calcification. Skull: No acute fracture. Sinuses/Orbits: Paranasal sinuses are clear.  Orbits are normal. Other: None. IMPRESSION: 1. No acute findings. 2. Mild chronic ischemic microvascular disease. Electronically  Signed   By: Toribio Agreste M.D.   On: 12/21/2023 16:19   DG Knee Complete 4 Views Left Result Date: 12/21/2023 CLINICAL DATA:  Post fall, now with left knee pain and swelling. EXAM: LEFT KNEE - COMPLETE 4+ VIEW COMPARISON:  None Available. FINDINGS: There is an acute obliquely oriented fracture involving the lateral aspect of the distal epiphysis of the femur with intra-articular extension and an expected lipohemarthrosis. Left knee joint spaces appear preserved. Minimal enthesopathic change involving the superior pole of the patella. Adjacent vascular calcifications. No radiopaque foreign body. IMPRESSION: Acute obliquely oriented fracture involving the lateral aspect of the distal epiphysis of the femur with intra-articular extension and an expected lipohemarthrosis. Electronically Signed   By: Norleen Roulette M.D.   On: 12/21/2023 15:52     Assessment and Plan:   Pre-operative  cardiac evaluation for left femur fx Permanent Afib/flutter s/p MAZE and atria clip not on a/c MV repair in 2016 s/p AV ablation and CRT-P Tobacco use HFrEF The patient presented with a mechanical fall fount to have a femur fracture, plan for surgery today. He denies pre-syncope or syncope at the time of the fall. Patient is overall fairly stable from a heart perspective. He did had three brief sharp chest pains yesterday, however seems more atypical. HS troponin was negative and EKG with no changes. He has chronic DOE that has slowly worsened over the years. He us  euvolemic on exam. He has a h/o permament afib/flutter and is not on a/c for this by choice. Seems uninterested still, but will revisit post op. PPM is functioning properly.  H/o mitral valve repair, we will update an echocardiogram. Most recent echo 2023 showed LVEF 45-50%, mild LVH, normal RVSF, severely dialted LA, mo MR, mild mitral stenosis, mean gradient , moderate dilation of the aortic root and borderline dilation of the aortic arch.HE has been started on Lopressor , which I agree with, however he seems to be against taking medications. Prior to the fall, METS>4. According to the RCRI the patient is 10.1% change of MACE. If echo is stable, OK to pursue surgery without further cardiac work-up.  For questions or updates, please contact [facility] Please consult [email or web address] for contact info under    Signed, Artha Stavros VEAR Fishman, PA-C  12/22/2023 7:31 AM

## 2023-12-23 ENCOUNTER — Encounter: Payer: Self-pay | Admitting: Orthopaedic Surgery

## 2023-12-23 DIAGNOSIS — S72402A Unspecified fracture of lower end of left femur, initial encounter for closed fracture: Secondary | ICD-10-CM | POA: Diagnosis not present

## 2023-12-23 DIAGNOSIS — R0789 Other chest pain: Secondary | ICD-10-CM | POA: Diagnosis not present

## 2023-12-23 DIAGNOSIS — E876 Hypokalemia: Secondary | ICD-10-CM | POA: Diagnosis not present

## 2023-12-23 DIAGNOSIS — S72402S Unspecified fracture of lower end of left femur, sequela: Secondary | ICD-10-CM | POA: Diagnosis not present

## 2023-12-23 DIAGNOSIS — I4821 Permanent atrial fibrillation: Secondary | ICD-10-CM | POA: Diagnosis not present

## 2023-12-23 LAB — BASIC METABOLIC PANEL
Anion gap: 10 (ref 5–15)
BUN: 21 mg/dL (ref 8–23)
CO2: 24 mmol/L (ref 22–32)
Calcium: 8.4 mg/dL — ABNORMAL LOW (ref 8.9–10.3)
Chloride: 100 mmol/L (ref 98–111)
Creatinine, Ser: 1.29 mg/dL — ABNORMAL HIGH (ref 0.61–1.24)
GFR, Estimated: 56 mL/min — ABNORMAL LOW (ref >60)
Glucose, Bld: 139 mg/dL — ABNORMAL HIGH (ref 70–99)
Potassium: 3.9 mmol/L (ref 3.5–5.1)
Sodium: 134 mmol/L — ABNORMAL LOW (ref 135–145)

## 2023-12-23 LAB — CBC
HCT: 35.9 % — ABNORMAL LOW (ref 39.0–52.0)
Hemoglobin: 11.9 g/dL — ABNORMAL LOW (ref 13.0–17.0)
MCH: 31.2 pg (ref 26.0–34.0)
MCHC: 33.1 g/dL (ref 30.0–36.0)
MCV: 94.2 fL (ref 80.0–100.0)
Platelets: 124 10*3/uL — ABNORMAL LOW (ref 150–400)
RBC: 3.81 MIL/uL — ABNORMAL LOW (ref 4.22–5.81)
RDW: 12.9 % (ref 11.5–15.5)
WBC: 7.5 10*3/uL (ref 4.0–10.5)
nRBC: 0 % (ref 0.0–0.2)

## 2023-12-23 NOTE — Progress Notes (Signed)
 Nutrition Follow-up  DOCUMENTATION CODES:   Not applicable  INTERVENTION:   -Continue Ensure Enlive po BID, each supplement provides 350 kcal and 20 grams of protein.  -Continue MVI with minerals daily -Continue regular diet   NUTRITION DIAGNOSIS:   Increased nutrient needs related to post-op healing as evidenced by estimated needs.  Ongoing  GOAL:   Patient will meet greater than or equal to 90% of their needs  Progressing   MONITOR:   PO intake, Supplement acceptance, Diet advancement  REASON FOR ASSESSMENT:   Consult Assessment of nutrition requirement/status, Hip fracture protocol  ASSESSMENT:   Pt with medical history significant of non obstructive CAD 2016, stroke with chronic ambulation dysfunction, chronic HFrEF with LVEF 45-50%, mitral valve repair, PPM, COPD, presented with fall and left knee pain.  1/2- s/p Left distal femur open reduction internal fixation with plate and screws   Reviewed I/O's: +410 ml x 24 hours and +330 ml since admission  UOP: 300 ml x 24 hours  Spoke with pt at bedside, who was pleasant and in good spirits today. Pt reports feel well after surgery and was laughing and joking with this RD during interview. Pt is anxious to work with therapies, but reports he knows that to expect has he has had a similar surgery in the past.   Pt reports that he has a fair appetite. Noted he consumed about 50% of his breakfast. Pt reports poor appetite PTA due to feeling generally unwell for about a month. Pt shares that he does not follow a structured meal pattern and ate when I felt hungry; intake varied greatly from day to day. Pt estimates that he has lost about 5# within the past month secondary to decreased oral intake. Pt also admits to weakness and falling secondary to feeling unwell.   Reviewed wt hx; wt has been stable over the past 11 months.   Discussed importance of good meal and supplement intake to promote healing. Pt does not drink  nutritional supplements PTA, but amenable to drink when explained importance.   Medications reviewed and include potassium chloride .   Labs reviewed: Na: 134, CBGS: 128 (inpatient orders for glycemic control are none).    NUTRITION - FOCUSED PHYSICAL EXAM:  Flowsheet Row Most Recent Value  Orbital Region No depletion  Upper Arm Region No depletion  Thoracic and Lumbar Region No depletion  Buccal Region No depletion  Temple Region No depletion  Clavicle Bone Region No depletion  Clavicle and Acromion Bone Region No depletion  Scapular Bone Region No depletion  Dorsal Hand No depletion  Patellar Region No depletion  Anterior Thigh Region No depletion  Posterior Calf Region No depletion  Edema (RD Assessment) Mild  Hair Reviewed  Eyes Reviewed  Mouth Reviewed  Skin Reviewed  Nails Reviewed       Diet Order:   Diet Order             Diet regular Room service appropriate? Yes; Fluid consistency: Thin  Diet effective now                   EDUCATION NEEDS:   No education needs have been identified at this time  Skin:  Skin Assessment: Reviewed RN Assessment  Last BM:  Unknown  Height:   Ht Readings from Last 1 Encounters:  12/21/23 6' (1.829 m)    Weight:   Wt Readings from Last 1 Encounters:  12/22/23 80.2 kg    Ideal Body Weight:  80.9 kg  BMI:  Body mass index is 23.98 kg/m.  Estimated Nutritional Needs:   Kcal:  1800-2000  Protein:  90-105 grams  Fluid:  > 1.8 l    Margery ORN, RD, LDN, CDCES Registered Dietitian III Certified Diabetes Care and Education Specialist If unable to reach this RD, please use RD Inpatient group chat on secure chat between hours of 8am-4 pm daily

## 2023-12-23 NOTE — Evaluation (Signed)
 Physical Therapy Evaluation Patient Details Name: Michael Delacruz MRN: [id number] DOB: 1941/11/27 Today's Date: 12/23/2023  History of Present Illness  Pt is an 83 y.o. male presenting to hospital 12/21/23 after tripping over a Christmas toy and falling to ground hitting head and L knee; imaging showing acute vertically oriented fracture of the distal femur with intra-articular extension through the lateral trochlea and lateral femoral condyle as well as a lipohemarthrosis of the left knee.  Pt with chest pains and hypokalemia.  Pt s/p L distal femur ORIF with plate and screws 12/22/23.  PMH includes CAD, a-fib, CHF, COPD, stroke, blindness R eye, mitral valve repair, PPM, AAA, bradycardia, fibromyalgia, L ankle sx.  Clinical Impression  Prior to recent medical concerns, pt reports being independent with ambulation; lives with his wife, daughter, and granddaughter in 1  level home with steps to enter.  L LE/knee pain appearing mild at rest beginning of session; 10/10 sharp shooting pain with any L LE movement; and 3/10 pain end of session at rest (pt pre-medicated with pain medication for therapy session; nurse updated on pt's pain status and therapy session).  Currently pt is mod assist to attempt to sit up on EOB x2 trials--pt able to bring B LE's towards EOB with assist and partially bring trunk up each attempt; 1st trial limited d/t pt reporting feeling like he was going to pass out (symptoms resolved after laying down in bed and pt agreeable to trying again) and 2nd trial limited d/t 10/10 sharp shooting L LE pain.  Pt assisted back to bed with all needs in reach.  Pt would currently benefit from skilled PT to address noted impairments and functional limitations (see below for any additional details).  Upon hospital discharge, pt would benefit from ongoing therapy.     If plan is discharge home, recommend the following: Two people to help with walking and/or transfers;Two people to help with  bathing/dressing/bathroom;Assist for transportation;Help with stairs or ramp for entrance   Can travel by private vehicle   No    Equipment Recommendations Rolling walker (2 wheels);BSC/3in1;Wheelchair (measurements PT);Wheelchair cushion (measurements PT);Hospital bed  Recommendations for Other Services  OT consult    Functional Status Assessment Patient has had a recent decline in their functional status and demonstrates the ability to make significant improvements in function in a reasonable and predictable amount of time.     Precautions / Restrictions Precautions Precautions: Fall Precaution Comments: Per ortho op note 12/22/23:  He will be in a knee immobilizer during ambulation.  He may come out of this at night or for hygiene. Required Braces or Orthoses: Knee Immobilizer - Left Restrictions Weight Bearing Restrictions Per Provider Order: Yes LLE Weight Bearing Per Provider Order: Weight bearing as tolerated      Mobility  Bed Mobility Overal bed mobility: Needs Assistance Bed Mobility: Supine to Sit     Supine to sit: HOB elevated, Used rails, Mod assist     General bed mobility comments: x2 attempts; assist for L LE; pt able to bring LE's towards edge of bed with assist and partially bring trunk up but limited first trial d/t pt reporting feeling like he was going to pass out (therapist had pt lay back down and after symptoms resolved, pt agreeable to trying again) and 2nd trial limited d/t sharp shooting L LE pain    Transfers                   General transfer comment: unable to sit  up on EOB to attempt    Ambulation/Gait               General Gait Details: unable to sit up on EOB to attempt  Stairs            Wheelchair Mobility     Tilt Bed    Modified Rankin (Stroke Patients Only)       Balance                                             Pertinent Vitals/Pain Pain Assessment Pain Assessment: 0-10 Pain  Score: 3  Pain Location: L lateral knee Pain Descriptors / Indicators: Aching, Discomfort, Guarding (sharp shooting pain with any L LE movement) Pain Intervention(s): Limited activity within patient's tolerance, Monitored during session, Premedicated before session, Repositioned HR 85 bpm and SpO2 sats 98% or greater on room air during sessions activities.    Home Living Family/patient expects to be discharged to:: Private residence Living Arrangements: Spouse/significant other;Children (Pt's wife, daughter, and granddaughter) Available Help at Discharge: Family;Available PRN/intermittently Type of Home: House Home Access: Stairs to enter Entrance Stairs-Rails: Right Entrance Stairs-Number of Steps: 3-4 from back entrance   Home Layout: One level Home Equipment: Grab bars - tub/shower;Grab bars - toilet;Rolling Walker (2 wheels)      Prior Function Prior Level of Function : Independent/Modified Independent             Mobility Comments: Independent with ambulation.       Extremity/Trunk Assessment   Upper Extremity Assessment Upper Extremity Assessment: Overall WFL for tasks assessed    Lower Extremity Assessment Lower Extremity Assessment: LLE deficits/detail (R LE AROM appearing WFL in bed) LLE Deficits / Details: able to wiggle toes (unable to actively DF/PF d/t shooting pain near knee) LLE: Unable to fully assess due to pain;Unable to fully assess due to immobilization (L LE KI in place throughout session)    Cervical / Trunk Assessment Cervical / Trunk Assessment: Normal  Communication   Communication Communication: No apparent difficulties Cueing Techniques: Verbal cues  Cognition Arousal: Alert Behavior During Therapy: WFL for tasks assessed/performed Overall Cognitive Status: Within Functional Limits for tasks assessed                                          General Comments General comments (skin integrity, edema, etc.): L LE KI in  place.  Nursing cleared pt for participation in physical therapy.  Pt agreeable to PT session.    Exercises     Assessment/Plan    PT Assessment Patient needs continued PT services  PT Problem List Decreased strength;Decreased range of motion;Decreased activity tolerance;Decreased balance;Decreased mobility;Decreased knowledge of use of DME;Decreased knowledge of precautions;Decreased skin integrity;Pain       PT Treatment Interventions DME instruction;Gait training;Stair training;Functional mobility training;Therapeutic activities;Therapeutic exercise;Balance training;Patient/family education    PT Goals (Current goals can be found in the Care Plan section)  Acute Rehab PT Goals Patient Stated Goal: to improve pain and walking PT Goal Formulation: With patient Time For Goal Achievement: 01/06/24 Potential to Achieve Goals: Good    Frequency 7X/week     Co-evaluation               AM-PAC PT 6 Clicks Mobility  Outcome Measure  Help needed turning from your back to your side while in a flat bed without using bedrails?: A Lot Help needed moving from lying on your back to sitting on the side of a flat bed without using bedrails?: Total Help needed moving to and from a bed to a chair (including a wheelchair)?: Total Help needed standing up from a chair using your arms (e.g., wheelchair or bedside chair)?: Total Help needed to walk in hospital room?: Total Help needed climbing 3-5 steps with a railing? : Total 6 Click Score: 7    End of Session Equipment Utilized During Treatment: Left knee immobilizer Activity Tolerance: Patient limited by pain Patient left: in bed;with call bell/phone within reach;with bed alarm set Nurse Communication: Mobility status;Precautions;Weight bearing status;Other (comment) (Pt's pain status) PT Visit Diagnosis: Other abnormalities of gait and mobility (R26.89);Muscle weakness (generalized) (M62.81);History of falling (Z91.81);Pain Pain -  Right/Left: Left Pain - part of body: Knee    Time: 1000-1027 PT Time Calculation (min) (ACUTE ONLY): 27 min   Charges:   PT Evaluation $PT Eval Low Complexity: 1 Low PT Treatments $Therapeutic Activity: 8-22 mins PT General Charges $$ ACUTE PT VISIT: 1 Visit        Damien Caulk, PT 12/23/23, 10:59 AM

## 2023-12-23 NOTE — Plan of Care (Signed)
  Problem: Clinical Measurements: Goal: Will remain free from infection Outcome: Progressing Goal: Diagnostic test results will improve Outcome: Progressing Goal: Respiratory complications will improve Outcome: Progressing Goal: Cardiovascular complication will be avoided Outcome: Progressing   Problem: Activity: Goal: Risk for activity intolerance will decrease Outcome: Progressing   Problem: Nutrition: Goal: Adequate nutrition will be maintained Outcome: Progressing   Problem: Elimination: Goal: Will not experience complications related to bowel motility Outcome: Progressing Goal: Will not experience complications related to urinary retention Outcome: Progressing   Problem: Pain Management: Goal: General experience of comfort will improve Outcome: Progressing   Problem: Safety: Goal: Ability to remain free from injury will improve Outcome: Progressing   Problem: Skin Integrity: Goal: Risk for impaired skin integrity will decrease Outcome: Progressing

## 2023-12-23 NOTE — Progress Notes (Signed)
   Subjective:  Patient reports pain as mild and well controlled. Tolerating food. Pending PT eval.  Objective:   VITALS:   Vitals:   12/22/23 2038 12/22/23 2319 12/23/23 0425 12/23/23 0816  BP: 118/76 121/76 120/78 116/73  Pulse: 77 84 88 84  Resp:  18 18 16   Temp:  98 F (36.7 C) (!) 97.5 F (36.4 C) 98.2 F (36.8 C)  TempSrc:  Oral  Oral  SpO2:  96% 97% 97%  Weight:      Height:        Neurologically intact Neurovascular intact Sensation intact distally Intact pulses distally Dorsiflexion/Plantar flexion intact Knee immobilizer in place  Dressing CDI   Lab Results  Component Value Date   WBC 7.5 12/23/2023   HGB 11.9 (L) 12/23/2023   HCT 35.9 (L) 12/23/2023   MCV 94.2 12/23/2023   PLT 124 (L) 12/23/2023     Assessment/Plan:  1 Day Post-Op   - Expected postop acute blood loss anemia - will monitor for symptoms - Patient to work with PT/OT to optimize mobilization safely - DVT ppx - SCDs, ambulation, aspirin  325mg  - Postoperative Abx: Ancef  x 2 additional doses given - WBAT operative extremity - Pain control - multimodal pain management, ATC acetaminophen  in conjunction with as needed narcotic (oxycodone ), although this should be minimized with other modalities  - Discharge planning pending CM, appreciate coordination     Michael Delacruz Dawson 12/23/2023, 8:33 AM

## 2023-12-23 NOTE — Progress Notes (Signed)
 Progress Note   Patient: Michael Delacruz FMW:[id number] DOB: [birth year] DOA: 12/21/2023     2 DOS: the patient was seen and examined on 12/23/2023   Brief hospital course: 83 year old man past medical history of CAD, stroke, chronic ambulation dysfunction, chronic heart failure reduced ejection fraction, mitral valve repair, pacemaker, COPD presented with a fall and left knee pain.  Patient was found to have an obliquely oriented fracture involving the lateral aspect of the distal left femur.  1/2.  Patient seen prior to operation.  He does have occasional chest pains but none when I saw him.  No shortness of breath.  Had a mechanical fall. 1/3.  Postoperative day 1 for left distal femur open reduction internal fixation with plate and screws.  Patient does have some pain.  Assessment and Plan: * Closed fracture of left distal femur (HCC) Postoperative day 1 for open reduction internal fixation of distal femur fracture today.  Pain control.  PT.  Aspirin  for DVT prophylaxis.  Atypical chest pain Atypical nature.  Echocardiogram showed an EF of 50%.  Troponins were negative.   Hypokalemia Replaced  Chronic heart failure with mildly reduced ejection fraction (HFmrEF, 41-49%) (HCC) Patient euvolemic.  EF improved to 50%.  Permanent atrial fibrillation (HCC) Aspirin  added  Thrombocytopenia (HCC) Platelet count 124  CKD (chronic kidney disease), stage III (HCC) CKD stage IIIa with creatinine 1.29 and a GFR 56  Anxiety Continue Zoloft  and Seroquel   COPD (chronic obstructive pulmonary disease) (HCC) Respiratory status stable        Subjective: Patient has 10 out of 10 pain in his left leg.  Admitted for distal femur fracture.  Postoperative day 1 today.  Physical Exam: Vitals:   12/22/23 2319 12/23/23 0425 12/23/23 0816 12/23/23 1142  BP: 121/76 120/78 116/73 109/73  Pulse: 84 88 84 87  Resp: 18 18 16 17   Temp: 98 F (36.7 C) (!) 97.5 F (36.4 C) 98.2 F (36.8 C) 98.1 F  (36.7 C)  TempSrc: Oral  Oral Oral  SpO2: 96% 97% 97% 98%  Weight:      Height:       Physical Exam HENT:     Head: Normocephalic.     Mouth/Throat:     Pharynx: No oropharyngeal exudate.  Eyes:     General: Lids are normal.     Conjunctiva/sclera: Conjunctivae normal.  Cardiovascular:     Rate and Rhythm: Normal rate and regular rhythm.     Heart sounds: Normal heart sounds, S1 normal and S2 normal.  Pulmonary:     Breath sounds: No decreased breath sounds, wheezing, rhonchi or rales.  Abdominal:     Palpations: Abdomen is soft.     Tenderness: There is no abdominal tenderness.  Musculoskeletal:     Right lower leg: No swelling.     Left lower leg: No swelling.  Skin:    General: Skin is warm.     Findings: No rash.  Neurological:     Mental Status: He is alert and oriented to person, place, and time.     Comments: Good feeling bilateral feet.  Able to wiggle toes bilaterally.     Data Reviewed: Creatinine 1.29, sodium 134, hemoglobin 11.9, platelet count 124  Family Communication: Updated wife on the phone  Disposition: Status is: Inpatient Remains inpatient appropriate because: Patient is postoperative day 1.  Planned Discharge Destination: Rehab    Time spent: 28 minutes  Author: Charlie Patterson, MD 12/23/2023 2:41 PM  For on call review [email or web address].

## 2023-12-23 NOTE — Evaluation (Signed)
 Occupational Therapy Evaluation Patient Details Name: Michael Delacruz MRN: [id number] DOB: 03/02/1941 Today's Date: 12/23/2023   History of Present Illness Pt is an 83 y.o. male presenting to hospital 12/21/23 after tripping over a Christmas toy and falling to ground hitting head and L knee; imaging showing acute vertically oriented fracture of the distal femur with intra-articular extension through the lateral trochlea and lateral femoral condyle as well as a lipohemarthrosis of the left knee.  Pt with chest pains and hypokalemia.  Pt s/p L distal femur ORIF with plate and screws 12/22/23.  PMH includes CAD, a-fib, CHF, COPD, stroke, blindness R eye, mitral valve repair, PPM, AAA, bradycardia, fibromyalgia, L ankle sx.   Clinical Impression   Michael Delacruz was seen for OT evaluation this date. Prior to hospital admission, pt was IND. Pt lives with family. On arrival pt seated on [facility] with RN in room. Pt requires MOD A x2 + RW to stand from [facility], MIN A x2 steps along EOB. SETUP seated grooming tasks. Pt would benefit from skilled OT to address noted impairments and functional limitations (see below for any additional details). Upon hospital discharge, recommend OT follow up.     If plan is discharge home, recommend the following: Two people to help with walking and/or transfers;Two people to help with bathing/dressing/bathroom    Functional Status Assessment  Patient has had a recent decline in their functional status and demonstrates the ability to make significant improvements in function in a reasonable and predictable amount of time.  Equipment Recommendations  BSC/3in1    Recommendations for Other Services       Precautions / Restrictions Precautions Precautions: Fall Precaution Comments: Per ortho op note 12/22/23:  He will be in a knee immobilizer during ambulation.  He may come out of this at night or for hygiene. Required Braces or Orthoses: Knee Immobilizer - Left Restrictions Weight  Bearing Restrictions Per Provider Order: Yes LLE Weight Bearing Per Provider Order: Weight bearing as tolerated      Mobility Bed Mobility Overal bed mobility: Needs Assistance Bed Mobility: Sit to Supine       Sit to supine: Contact guard assist        Transfers Overall transfer level: Needs assistance Equipment used: Rolling walker (2 wheels) Transfers: Sit to/from Stand, Bed to chair/wheelchair/BSC Sit to Stand: Mod assist, +2 physical assistance     Step pivot transfers: Min assist, +2 physical assistance            Balance Overall balance assessment: Needs assistance Sitting-balance support: No upper extremity supported, Feet supported Sitting balance-Leahy Scale: Good     Standing balance support: Bilateral upper extremity supported Standing balance-Leahy Scale: Poor                             ADL either performed or assessed with clinical judgement   ADL Overall ADL's : Needs assistance/impaired                                       General ADL Comments: MOD A x2 + RW for BSC t/f. SETUP seated grooming tasks      Pertinent Vitals/Pain Pain Assessment Pain Assessment: Faces Faces Pain Scale: Hurts little more Pain Location: L lateral knee Pain Descriptors / Indicators: Aching, Discomfort, Guarding Pain Intervention(s): Limited activity within patient's tolerance, Repositioned, Premedicated before session  Extremity/Trunk Assessment Upper Extremity Assessment Upper Extremity Assessment: Overall WFL for tasks assessed   Lower Extremity Assessment Lower Extremity Assessment: LLE deficits/detail LLE: Unable to fully assess due to pain;Unable to fully assess due to immobilization       Communication Communication Communication: No apparent difficulties Cueing Techniques: Verbal cues   Cognition Arousal: Alert Behavior During Therapy: Agitated Overall Cognitive Status: Within Functional Limits for tasks  assessed                                                  Home Living Family/patient expects to be discharged to:: Private residence Living Arrangements: Spouse/significant other;Children Available Help at Discharge: Family;Available PRN/intermittently Type of Home: House Home Access: Stairs to enter [employer] of Steps: 3-4 from back entrance Entrance Stairs-Rails: Right Home Layout: One level     Bathroom Shower/Tub: Chief Strategy Officer: Standard     Home Equipment: Grab bars - tub/shower;Grab bars - toilet;Rolling Walker (2 wheels)          Prior Functioning/Environment Prior Level of Function : Independent/Modified Independent             Mobility Comments: Independent with ambulation.          OT Problem List: Decreased strength;Decreased range of motion;Decreased activity tolerance;Impaired balance (sitting and/or standing);Pain      OT Treatment/Interventions: Self-care/ADL training;Therapeutic exercise;Energy conservation;DME and/or AE instruction;Therapeutic activities;Patient/family education    OT Goals(Current goals can be found in the care plan section) Acute Rehab OT Goals Patient Stated Goal: to walk OT Goal Formulation: With patient Time For Goal Achievement: 01/06/24 Potential to Achieve Goals: Good ADL Goals Pt Will Perform Grooming: with modified independence;standing Pt Will Perform Lower Body Dressing: with supervision;sitting/lateral leans Pt Will Transfer to Toilet: with modified independence;ambulating;regular height toilet  OT Frequency: Min 1X/week    Co-evaluation              AM-PAC OT 6 Clicks Daily Activity     Outcome Measure Help from another person eating meals?: None Help from another person taking care of personal grooming?: A Little Help from another person toileting, which includes using toliet, bedpan, or urinal?: A Lot Help from another person bathing (including  washing, rinsing, drying)?: A Lot Help from another person to put on and taking off regular upper body clothing?: None Help from another person to put on and taking off regular lower body clothing?: A Lot 6 Click Score: 17   End of Session Equipment Utilized During Treatment: Rolling walker (2 wheels);Gait belt Nurse Communication: Mobility status  Activity Tolerance: Patient tolerated treatment well Patient left: in bed;with call bell/phone within reach;with bed alarm set  OT Visit Diagnosis: Unsteadiness on feet (R26.81);Muscle weakness (generalized) (M62.81)                Time: 8556-8498 OT Time Calculation (min): 18 min Charges:  OT General Charges $OT Visit: 1 Visit OT Evaluation $OT Eval Moderate Complexity: 1 Mod OT Treatments $Self Care/Home Management : 8-22 mins  Elston Slot, M.S. OTR/L  12/23/23, 3:58 PM  ascom [phone]

## 2023-12-24 DIAGNOSIS — R410 Disorientation, unspecified: Secondary | ICD-10-CM

## 2023-12-24 DIAGNOSIS — S72402S Unspecified fracture of lower end of left femur, sequela: Secondary | ICD-10-CM | POA: Diagnosis not present

## 2023-12-24 DIAGNOSIS — I5022 Chronic systolic (congestive) heart failure: Secondary | ICD-10-CM | POA: Diagnosis not present

## 2023-12-24 DIAGNOSIS — S72402A Unspecified fracture of lower end of left femur, initial encounter for closed fracture: Secondary | ICD-10-CM | POA: Diagnosis not present

## 2023-12-24 DIAGNOSIS — R0789 Other chest pain: Secondary | ICD-10-CM | POA: Diagnosis not present

## 2023-12-24 MED ORDER — METOPROLOL SUCCINATE ER 25 MG PO TB24
12.5000 mg | ORAL_TABLET | Freq: Every day | ORAL | Status: DC
  Administered 2023-12-24 – 2023-12-26 (×3): 12.5 mg via ORAL
  Filled 2023-12-24 (×3): qty 1

## 2023-12-24 MED ORDER — POLYETHYLENE GLYCOL 3350 17 G PO PACK
17.0000 g | PACK | Freq: Every day | ORAL | Status: DC
  Administered 2023-12-24 – 2023-12-25 (×2): 17 g via ORAL
  Filled 2023-12-24 (×2): qty 1

## 2023-12-24 MED ORDER — SODIUM CHLORIDE 0.9 % IV BOLUS
500.0000 mL | Freq: Once | INTRAVENOUS | Status: AC
  Administered 2023-12-24: 500 mL via INTRAVENOUS

## 2023-12-24 MED ORDER — SENNOSIDES-DOCUSATE SODIUM 8.6-50 MG PO TABS
2.0000 | ORAL_TABLET | Freq: Every day | ORAL | Status: DC
  Administered 2023-12-27: 2 via ORAL
  Filled 2023-12-24 (×3): qty 2

## 2023-12-24 MED ORDER — ACETAMINOPHEN 325 MG PO TABS: 650.0000 mg | ORAL_TABLET | Freq: Four times a day (QID) | ORAL | Status: DC | PRN

## 2023-12-24 NOTE — Progress Notes (Signed)
 Physical Therapy Treatment Patient Details Name: Michael Delacruz MRN: [id number] DOB: March 18, 1941 Today's Date: 12/24/2023   History of Present Illness Pt is an 83 y.o. male presenting to hospital 12/21/23 after tripping over a Christmas toy and falling to ground hitting head and L knee; imaging showing acute vertically oriented fracture of the distal femur with intra-articular extension through the lateral trochlea and lateral femoral condyle as well as a lipohemarthrosis of the left knee.  Pt with chest pains and hypokalemia.  Pt s/p L distal femur ORIF with plate and screws 12/22/23.  PMH includes CAD, a-fib, CHF, COPD, stroke, blindness R eye, mitral valve repair, PPM, AAA, bradycardia, fibromyalgia, L ankle sx.    PT Comments  Pt was long sitting in bed wearing knee immobilizer LLE. He is alert but present with confusion. Author called pt's spouse post session. Spouse endorses some baseline cognition deficits however that his current cognition is much worse. Pt is cooperative and is able to follow commands however slow processing and easily distracted. Pt used humor to attempt to hide deficits. Pt was able to exit R side of bed, stand to RW and take aa few steps to recliner. Pt then required much more assistance to stand from lower recliner surface. Recommend +2 assistance for safety. Acute PT will continue to follow. MD made aware pt's urine very dark. UA ordered. DC recs remain appropriate. Family requested [facility] if bed offered.    If plan is discharge home, recommend the following: A lot of help with walking and/or transfers;A lot of help with bathing/dressing/bathroom;Assistance with cooking/housework;Direct supervision/assist for medications management;Direct supervision/assist for financial management;Assist for transportation;Help with stairs or ramp for entrance;Supervision due to cognitive status     Equipment Recommendations  Other (comment) (Defer to next level of care)        Precautions / Restrictions Precautions Precautions: Fall Precaution Comments: knee immobilizer for all OOB. Can removed while in bed Required Braces or Orthoses: Knee Immobilizer - Left Restrictions Weight Bearing Restrictions Per Provider Order: Yes LLE Weight Bearing Per Provider Order: Weight bearing as tolerated     Mobility  Bed Mobility Overal bed mobility: Needs Assistance Bed Mobility: Supine to Sit, Sit to Supine  Supine to sit: Mod assist, Used rails, HOB elevated Sit to supine: Max assist General bed mobility comments: Pt required extensive time and assistance to exit R bed.    Transfers Overall transfer level: Needs assistance Equipment used: Rolling walker (2 wheels) Transfers: Sit to/from Stand Sit to Stand: Min assist, Max assist (min from elevated bed height but max from lower recliner surface.)  General transfer comment: Pt was able to stand EOB(elevated with min assist + moderate vcs for technique. increased time to perform. He was able to tolerate taking ~ 3-5 stesp from EOB to recliner. PT then required max assist to stand form lower recliner surface. Pt is evry slow moving    Ambulation/Gait Ambulation/Gait assistance: Min assist Gait Distance (Feet): 3 Feet Assistive device: Rolling walker (2 wheels) Gait Pattern/deviations: Step-to pattern Gait velocity: decreased  General Gait Details: very slow cautious steps. limited wt toilerance on LLE.     Balance Overall balance assessment: Needs assistance Sitting-balance support: No upper extremity supported, Feet supported Sitting balance-Leahy Scale: Good     Standing balance support: Bilateral upper extremity supported Standing balance-Leahy Scale: Fair Standing balance comment: reliant on BUE support. severely flexed posture in standing but with VCs is able to correct       Cognition Arousal: Alert Behavior  During Therapy: Impulsive, WFL for tasks assessed/performed Overall Cognitive Status:  Within Functional Limits for tasks assessed      General Comments: Pt is alert but only oriented x 2. used humor to try to cover up cognition deficits. Author reached out to family and pt's spouse states pt's cognition is much worse than what it is at baseline           General Comments General comments (skin integrity, edema, etc.): LLE knee immobilizer in place throughout sesison. pt has dark urine when standing and using urinal. MD ordered UA post session      Pertinent Vitals/Pain Pain Assessment Pain Assessment: 0-10 Pain Score: 8  Pain Location: L lateral knee Pain Descriptors / Indicators: Aching, Discomfort, Guarding Pain Intervention(s): Limited activity within patient's tolerance, Monitored during session, Premedicated before session, Repositioned     PT Goals (current goals can now be found in the care plan section) Acute Rehab PT Goals Patient Stated Goal: none stated Progress towards PT goals: Progressing toward goals    Frequency    7X/week       AM-PAC PT 6 Clicks Mobility   Outcome Measure  Help needed turning from your back to your side while in a flat bed without using bedrails?: A Lot Help needed moving from lying on your back to sitting on the side of a flat bed without using bedrails?: A Lot Help needed moving to and from a bed to a chair (including a wheelchair)?: A Lot Help needed standing up from a chair using your arms (e.g., wheelchair or bedside chair)?: A Lot Help needed to walk in hospital room?: A Lot Help needed climbing 3-5 steps with a railing? : Total 6 Click Score: 11    End of Session Equipment Utilized During Treatment: Left knee immobilizer Activity Tolerance: Patient tolerated treatment well;Patient limited by pain Patient left: in bed;with call bell/phone within reach;with bed alarm set Nurse Communication: Mobility status;Precautions;Weight bearing status;Other (comment) PT Visit Diagnosis: Other abnormalities of gait and  mobility (R26.89);Muscle weakness (generalized) (M62.81);History of falling (Z91.81);Pain Pain - Right/Left: Left Pain - part of body: Knee     Time: 1440-1458 PT Time Calculation (min) (ACUTE ONLY): 18 min  Charges:    $Therapeutic Activity: 8-22 mins PT General Charges $$ ACUTE PT VISIT: 1 Visit                     Rankin Essex PTA 12/24/23, 3:20 PM

## 2023-12-24 NOTE — Progress Notes (Signed)
 PT Cancellation Note  Patient Details Name: Michael Delacruz MRN: [id number] DOB: 05-18-1941   Cancelled Treatment:     PT attempt x 2 this morning. Pt very lethargic and unable to stay awake. Author feels its due to medications. Will return later this date to encouraged OOB activity/participation.    Michael Delacruz 12/24/2023, 10:39 AM

## 2023-12-24 NOTE — Plan of Care (Signed)
  Problem: Activity: Goal: Risk for activity intolerance will decrease Outcome: Progressing   Problem: Nutrition: Goal: Adequate nutrition will be maintained Outcome: Progressing   Problem: Education: Goal: Knowledge of General Education information will improve Description: Including pain rating scale, medication(s)/side effects and non-pharmacologic comfort measures Outcome: Not Progressing   Problem: Health Behavior/Discharge Planning: Goal: Ability to manage health-related needs will improve Outcome: Not Progressing   Problem: Elimination: Goal: Will not experience complications related to bowel motility Outcome: Not Progressing

## 2023-12-24 NOTE — Progress Notes (Signed)
   Subjective:  Patient reports pain as moderate.Voiding and tolerating food. States PT yesterday went well.  Objective:   VITALS:   Vitals:   12/23/23 1516 12/23/23 2017 12/23/23 2337 12/24/23 0416  BP: 123/80 128/71 124/66 110/73  Pulse: 86 84 81 84  Resp: 17 18 20 20   Temp: 98.1 F (36.7 C) 98.4 F (36.9 C) 98.2 F (36.8 C) 98 F (36.7 C)  TempSrc: Oral Oral Oral   SpO2: 100% 97% 97% 98%  Weight:      Height:        Neurologically intact Neurovascular intact Sensation intact distally Intact pulses distally Dorsiflexion/Plantar flexion intact Dressing CDI  Knee immobilizer in place  Lab Results  Component Value Date   WBC 7.5 12/23/2023   HGB 11.9 (L) 12/23/2023   HCT 35.9 (L) 12/23/2023   MCV 94.2 12/23/2023   PLT 124 (L) 12/23/2023     Assessment/Plan:  2 Days Post-Op   - Expected postop acute blood loss anemia - will monitor for symptoms - Patient to work with PT/OT to optimize mobilization safely - DVT ppx - SCDs, ambulation, 325mg  aspirin  - Postoperative Abx: Ancef  x 2 additional doses given - WBAT operative extremity - Pain control - multimodal pain management, ATC acetaminophen  in conjunction with as needed narcotic (oxycodone ), although this should be minimized with other modalities  - Discharge planning pending CM, appreciate coordination     Conley CHRISTELLA Dawson 12/24/2023, 7:31 AM

## 2023-12-24 NOTE — Assessment & Plan Note (Addendum)
 Could be from pain medications, anesthesia and other medications that he is not taking.  Discontinued IV pain medications and medications that he is not taking at home.  Mental status improved the last 2 days.

## 2023-12-24 NOTE — Progress Notes (Signed)
 Progress Note   Patient: Michael Delacruz FMW:[id number] DOB: [birth year] DOA: 12/21/2023     3 DOS: the patient was seen and examined on 12/24/2023   Brief hospital course: 84 year old man past medical history of CAD, stroke, chronic ambulation dysfunction, chronic heart failure reduced ejection fraction, mitral valve repair, pacemaker, COPD presented with a fall and left knee pain.  Patient was found to have an obliquely oriented fracture involving the lateral aspect of the distal left femur.  1/2.  Patient seen prior to operation.  He does have occasional chest pains but none when I saw him.  No shortness of breath.  Had a mechanical fall. 1/3.  Postoperative day 1 for left distal femur open reduction internal fixation with plate and screws.  Patient does have some pain.  Assessment and Plan: * Closed fracture of left distal femur (HCC) Postoperative day 2 for open reduction internal fixation of distal femur fracture today.  Pain control.  Appreciate PT evaluation.  Will need rehab.  Aspirin  for DVT prophylaxis.  Confusion Could be from pain medications and/or anesthesia.  Patient also has dark urine will send off for urine analysis and reflex culture and give a fluid bolus.  Atypical chest pain Atypical nature.  Echocardiogram showed an EF of 50%.  Troponins were negative.   Hypokalemia Replaced  Chronic heart failure with mildly reduced ejection fraction (HFmrEF, 41-49%) (HCC) Patient euvolemic.  EF improved to 50%.  Permanent atrial fibrillation (HCC) Aspirin  added.  Patient did not want anticoagulation previously.  Thrombocytopenia (HCC) Platelet count 124  CKD (chronic kidney disease), stage III (HCC) CKD stage IIIa with creatinine 1.29 and a GFR 56  Anxiety Continue Zoloft  and Seroquel   COPD (chronic obstructive pulmonary disease) (HCC) Respiratory status stable        Subjective: Patient seems a little more confused today than yesterday.  Still able to communicate  fine.  Physical therapist noticed his urine was dark will send off urine analysis and give a fluid bolus.  Physical Exam: Vitals:   12/23/23 2017 12/23/23 2337 12/24/23 0416 12/24/23 0724  BP: 128/71 124/66 110/73 109/67  Pulse: 84 81 84 84  Resp: 18 20 20 17   Temp: 98.4 F (36.9 C) 98.2 F (36.8 C) 98 F (36.7 C) 98.1 F (36.7 C)  TempSrc: Oral Oral    SpO2: 97% 97% 98% 97%  Weight:      Height:       Physical Exam HENT:     Head: Normocephalic.     Mouth/Throat:     Pharynx: No oropharyngeal exudate.  Eyes:     General: Lids are normal.     Conjunctiva/sclera: Conjunctivae normal.  Cardiovascular:     Rate and Rhythm: Normal rate. Rhythm irregularly irregular.     Heart sounds: Normal heart sounds, S1 normal and S2 normal.  Pulmonary:     Breath sounds: No decreased breath sounds, wheezing, rhonchi or rales.  Abdominal:     Palpations: Abdomen is soft.     Tenderness: There is no abdominal tenderness.  Musculoskeletal:     Right lower leg: No swelling.     Left lower leg: No swelling.  Skin:    General: Skin is warm.     Findings: No rash.  Neurological:     Mental Status: He is alert. He is confused.     Comments: Good feeling bilateral feet.  Able to wiggle toes bilaterally.     Data Reviewed: Sodium 134, creatinine 1.29, hemoglobin 11.9  Family Communication:  Updated wife on the phone  Disposition: Status is: Inpatient Remains inpatient appropriate because: Will need rehab and insurance authorization to go out to rehab.  Patient will little confusion and dark urine today will send off urine analysis and give a fluid bolus.  Planned Discharge Destination: Rehab    Time spent: 28 minutes  Author: Charlie Patterson, MD 12/24/2023 3:22 PM  For on call review [email or web address].

## 2023-12-25 DIAGNOSIS — R0789 Other chest pain: Secondary | ICD-10-CM | POA: Diagnosis not present

## 2023-12-25 DIAGNOSIS — S72402S Unspecified fracture of lower end of left femur, sequela: Secondary | ICD-10-CM | POA: Diagnosis not present

## 2023-12-25 DIAGNOSIS — E876 Hypokalemia: Secondary | ICD-10-CM | POA: Diagnosis not present

## 2023-12-25 DIAGNOSIS — R410 Disorientation, unspecified: Secondary | ICD-10-CM | POA: Diagnosis not present

## 2023-12-25 DIAGNOSIS — S72402A Unspecified fracture of lower end of left femur, initial encounter for closed fracture: Secondary | ICD-10-CM | POA: Diagnosis not present

## 2023-12-25 LAB — URINALYSIS, W/ REFLEX TO CULTURE (INFECTION SUSPECTED)
Bacteria, UA: NONE SEEN
Bilirubin Urine: NEGATIVE
Glucose, UA: NEGATIVE mg/dL
Ketones, ur: NEGATIVE mg/dL
Leukocytes,Ua: NEGATIVE
Nitrite: NEGATIVE
Protein, ur: NEGATIVE mg/dL
Specific Gravity, Urine: 1.026 (ref 1.005–1.030)
Squamous Epithelial / HPF: 0 /[HPF] (ref 0–5)
pH: 5 (ref 5.0–8.0)

## 2023-12-25 LAB — BASIC METABOLIC PANEL
Anion gap: 6 (ref 5–15)
BUN: 20 mg/dL (ref 8–23)
CO2: 25 mmol/L (ref 22–32)
Calcium: 8.6 mg/dL — ABNORMAL LOW (ref 8.9–10.3)
Chloride: 104 mmol/L (ref 98–111)
Creatinine, Ser: 1.03 mg/dL (ref 0.61–1.24)
GFR, Estimated: >60 mL/min (ref >60)
Glucose, Bld: 102 mg/dL — ABNORMAL HIGH (ref 70–99)
Potassium: 3.9 mmol/L (ref 3.5–5.1)
Sodium: 135 mmol/L (ref 135–145)

## 2023-12-25 LAB — CBC
HCT: 32.7 % — ABNORMAL LOW (ref 39.0–52.0)
Hemoglobin: 11.1 g/dL — ABNORMAL LOW (ref 13.0–17.0)
MCH: 31.5 pg (ref 26.0–34.0)
MCHC: 33.9 g/dL (ref 30.0–36.0)
MCV: 92.9 fL (ref 80.0–100.0)
Platelets: 125 10*3/uL — ABNORMAL LOW (ref 150–400)
RBC: 3.52 MIL/uL — ABNORMAL LOW (ref 4.22–5.81)
RDW: 12.7 % (ref 11.5–15.5)
WBC: 4.8 10*3/uL (ref 4.0–10.5)
nRBC: 0 % (ref 0.0–0.2)

## 2023-12-25 MED ORDER — POLYETHYLENE GLYCOL 3350 17 G PO PACK
17.0000 g | PACK | Freq: Two times a day (BID) | ORAL | Status: DC
  Filled 2023-12-25: qty 1

## 2023-12-25 MED ORDER — OXYCODONE HCL 5 MG PO TABS
5.0000 mg | ORAL_TABLET | ORAL | Status: DC | PRN
  Administered 2023-12-26 – 2023-12-29 (×6): 5 mg via ORAL
  Filled 2023-12-25 (×7): qty 1

## 2023-12-25 NOTE — Progress Notes (Signed)
 PT Cancellation Note  Patient Details Name: Michael Delacruz MRN: [id number] DOB: [birth year]   Cancelled Treatment:    Reason Eval/Treat Not Completed: Other (comment)  Pt in room asking to have BM.  Pt with leg off bed and resting on BSC.  Attempted to assist pt but he became generally angry at attempt to assist him.  Explained I would help him to [facility] but he became upset and stated he did not understand what I wanted him to do.  Explained he has been getting to chair daily and to [facility] with staff and that transfer to [facility] was appropriate.  Pt put his legs back in bed and stated I am on drugs I don't understand what you want me to do.  Again explained but he refused mobility at this time.  Offered bedpan but he refused.  Discussed with tech and behavior is generally consistent with prior interactions with staff. Will defer to later time/date.    Lauraine Gills 12/25/2023, 11:04 AM

## 2023-12-25 NOTE — NC FL2 (Signed)
 [place]  MEDICAID FL2 LEVEL OF CARE FORM     IDENTIFICATION  Patient Name: Michael Delacruz Birthdate: [birth year] Sex: male Admission Date (Current Location): 12/21/2023  [facility] and [place] Number:  Chiropodist and Address:  [facility], [address] [place], [place] [address]      Provider Number: [id number]  Attending Physician Name and Address:  Josette Ade, MD  Relative Name and Phone Number:  Other [place] [phone]    Current Level of Care: Hospital Recommended Level of Care: Skilled Nursing Facility Prior Approval Number:    Date Approved/Denied:   PASRR Number: [id number]  Discharge Plan: SNF    Current Diagnoses: Patient Active Problem List   Diagnosis Date Noted   Confusion 12/24/2023   COPD (chronic obstructive pulmonary disease) (HCC) 12/22/2023   Anxiety 12/22/2023   CKD (chronic kidney disease), stage III (HCC) 12/22/2023   Thrombocytopenia (HCC) 12/22/2023   Closed fracture of left distal femur (HCC) 12/21/2023   Atrial flutter (HCC) RVR 10/03/2023   Cardiac resynchronization therapy pacemaker (CRT-P) in place 10/03/2023   Failure to thrive in adult 02/01/2021   Altered mental status 01/31/2021   CHB (complete heart block) (HCC) 01/31/2021   Encephalopathy 01/31/2021   Generalized anxiety disorder 05/04/2018   OSA (obstructive sleep apnea) 05/04/2018   History of stroke 05/04/2018   Retinal hemorrhage, left eye 05/04/2018   Late effects of cerebral ischemic stroke 05/04/2018   Hypothyroidism 05/04/2018   Permanent atrial fibrillation (HCC) 05/02/2018   Tachycardia induced cardiomyopathy (HCC) 05/02/2018   Hypokalemia 05/02/2018   Constipation 04/25/2018   Insomnia due to medical condition 02/22/2018   Depression, major, recurrent, moderate (HCC) 01/16/2018   Somatic symptom disorder 01/16/2018   Vision loss 12/31/2015   TIA (transient ischemic attack) 12/06/2015   S/P MVR (mitral valve repair)  11/11/2015   S/P Minimally invasive maze operation for atrial fibrillation 10/22/2015   Chronic heart failure with mildly reduced ejection fraction (HFmrEF, 41-49%) (HCC)    Atrial fibrillation with RVR (HCC)    Centrilobular emphysema (HCC)    Hypotension 11/02/2012   Stress at home 11/02/2012   Smoking 04/27/2012   Hyperlipidemia 04/27/2012   CAD (coronary artery disease) 04/27/2012   Heart palpitations 09/03/2011   Dizziness 05/19/2011   MVP (mitral valve prolapse) 05/05/2011   Mitral valve regurgitation 05/05/2011   Pulmonary HTN (HCC) 05/05/2011   Atypical chest pain 01/04/2011    Orientation RESPIRATION BLADDER Height & Weight     Self, Time, Situation  Normal Continent Weight: 80.2 kg Height:  6' (182.9 cm)  BEHAVIORAL SYMPTOMS/MOOD NEUROLOGICAL BOWEL NUTRITION STATUS  Other (Comment) (None)   Continent Diet  AMBULATORY STATUS COMMUNICATION OF NEEDS Skin   Extensive Assist Verbally Skin abrasions, Other (Comment) (LLE wound compression wrap/ ecchymosis bilateral arms)                       Personal Care Assistance Level of Assistance  Bathing, Dressing Bathing Assistance: Limited assistance   Dressing Assistance: Limited assistance     Functional Limitations Info  Sight, Hearing, Speech Sight Info: Impaired (Wears glasses) Hearing Info: Adequate Speech Info: Adequate    SPECIAL CARE FACTORS FREQUENCY  PT (By licensed PT), OT (By licensed OT)     PT Frequency: 5x week OT Frequency: 5x week            Contractures Contractures Info: Present    Additional Factors Info  Code Status, Allergies Code Status Info: Full Allergies Info:  Codeine, Digoxin , Morphine , Macrodantin           Current Medications (12/25/2023):  This is the current hospital active medication list Current Facility-Administered Medications  Medication Dose Route Frequency Provider Last Rate Last Admin   acetaminophen  (TYLENOL ) tablet 650 mg  650 mg Oral Q6H PRN Wieting,  Richard, MD       albuterol  (PROVENTIL ) (2.5 MG/3ML) 0.083% nebulizer solution 2.5 mg  2.5 mg Nebulization Q6H PRN Genelle Standing, MD       aspirin  tablet 325 mg  325 mg Oral Daily Bokshan, Steven, MD   325 mg at 12/24/23 0847   feeding supplement (ENSURE ENLIVE / ENSURE PLUS) liquid 237 mL  237 mL Oral BID BM Genelle Standing, MD   237 mL at 12/24/23 1550   hydrALAZINE  (APRESOLINE ) injection 5 mg  5 mg Intravenous Q6H PRN Genelle Standing, MD       HYDROmorphone  (DILAUDID ) injection 0.5 mg  0.5 mg Intravenous Q2H PRN Genelle Standing, MD   0.5 mg at 12/22/23 1947   metoprolol  succinate (TOPROL -XL) 24 hr tablet 12.5 mg  12.5 mg Oral Daily Josette Ade, MD   12.5 mg at 12/24/23 9150   multivitamin with minerals tablet 1 tablet  1 tablet Oral Daily Genelle Standing, MD   1 tablet at 12/24/23 9152   ondansetron  (ZOFRAN ) injection 4 mg  4 mg Intravenous Q6H PRN Genelle Standing, MD       oxyCODONE  (Oxy IR/ROXICODONE ) immediate release tablet 5-10 mg  5-10 mg Oral Q4H PRN Genelle Standing, MD   10 mg at 12/24/23 2205   polyethylene glycol (MIRALAX  / GLYCOLAX ) packet 17 g  17 g Oral Daily Josette Ade, MD   17 g at 12/24/23 1741   QUEtiapine  (SEROQUEL ) tablet 25 mg  25 mg Oral QHS Bokshan, Steven, MD   25 mg at 12/24/23 2205   senna-docusate (Senokot-S) tablet 2 tablet  2 tablet Oral QHS Wieting, Richard, MD       sertraline  (ZOLOFT ) tablet 25 mg  25 mg Oral Daily Bokshan, Steven, MD   25 mg at 12/24/23 0848     Discharge Medications: Please see discharge summary for a list of discharge medications.  Relevant Imaging Results:  Relevant Lab Results:   Additional Information Permanent Pacemaker, SS# [id number]  Racheal LITTIE Schimke, RN

## 2023-12-25 NOTE — TOC Progression Note (Signed)
 Transition of Care [facility]) - Progression Note    Patient Details  Name: [name] MRN: [id number] Date of Birth: 1941/07/28  [facility] CM/SW Contact  Racheal LITTIE Schimke, RN Phone Number: 12/25/2023, 9:09 AM  Clinical Narrative:   Spoke with Spouse about SNF recommendation, she was receptive and primary preference [facility]. SNF process started PASRR# [id number].         Expected Discharge Plan and Services                                               Social Determinants of Health (SDOH) Interventions SDOH Screenings   Food Insecurity: Food Insecurity Present (12/21/2023)  Housing: Low Risk  (12/21/2023)  Transportation Needs: No Transportation Needs (12/21/2023)  Utilities: Not At Risk (12/21/2023)  Alcohol Screen: Low Risk  (01/28/2023)  Depression (PHQ2-9): Low Risk  (01/28/2023)  Financial Resource Strain: Medium Risk (10/26/2023)  Physical Activity: Inactive (01/28/2023)  Social Connections: Moderately Isolated (12/21/2023)  Stress: No Stress Concern Present (01/28/2023)  Tobacco Use: High Risk (12/21/2023)  Health Literacy: Adequate Health Literacy (10/26/2023)    Readmission Risk Interventions     No data to display

## 2023-12-25 NOTE — Plan of Care (Signed)

## 2023-12-25 NOTE — Progress Notes (Signed)
 Physical Therapy Treatment Patient Details Name: Michael Delacruz MRN: [id number] DOB: 1941-09-27 Today's Date: 12/25/2023   History of Present Illness Pt is an 83 y.o. male presenting to hospital 12/21/23 after tripping over a Christmas toy and falling to ground hitting head and L knee; imaging showing acute vertically oriented fracture of the distal femur with intra-articular extension through the lateral trochlea and lateral femoral condyle as well as a lipohemarthrosis of the left knee.  Pt with chest pains and hypokalemia.  Pt s/p L distal femur ORIF with plate and screws 12/22/23.  PMH includes CAD, a-fib, CHF, COPD, stroke, blindness R eye, mitral valve repair, PPM, AAA, bradycardia, fibromyalgia, L ankle sx.    PT Comments  Daughter arrived and asking to re-attempt session.  Arrived in room and pt more cooperative for session.  He is able to get to EOB with min a x 1 for LE support.  Stands with min  a x 1 from slightly elevated bed.  Weight shifting prior to attempts at steps.  He takes 2 small steps with min a x 1 then anxiety etc increases and he begins to tremor/shake.  Increased assist and reassurance and daughter is asked to come and provide +2 assist.  He is able to take several more small steps to transfer to chair and mod a x 1 to guide safely to chair.  Remained up with needs met.  Communicated with nursing need for +2 for safety upon return to bed.   If plan is discharge home, recommend the following: Assistance with cooking/housework;Direct supervision/assist for medications management;Direct supervision/assist for financial management;Assist for transportation;Help with stairs or ramp for entrance;Supervision due to cognitive status;Two people to help with walking and/or transfers;Two people to help with bathing/dressing/bathroom   Can travel by private vehicle        Equipment Recommendations  Other (comment) (Defer to next level of care)    Recommendations for Other Services        Precautions / Restrictions Precautions Precautions: Fall Precaution Comments: knee immobilizer for all OOB. Can removed while in bed Required Braces or Orthoses: Knee Immobilizer - Left Restrictions Weight Bearing Restrictions Per Provider Order: Yes LLE Weight Bearing Per Provider Order: Weight bearing as tolerated     Mobility  Bed Mobility Overal bed mobility: Needs Assistance Bed Mobility: Supine to Sit     Supine to sit: Mod assist, Used rails, HOB elevated       Patient Response: Cooperative  Transfers Overall transfer level: Needs assistance Equipment used: Rolling walker (2 wheels) Transfers: Sit to/from Stand Sit to Stand: Mod assist, From elevated surface, Min assist   Step pivot transfers: Min assist, +2 physical assistance       General transfer comment: very small steps.  does become anxious mid transfer and begins shaking and daughter is asked to step in for gaurding/safety and then makes it to chair.    Ambulation/Gait Ambulation/Gait assistance: Min assist, Mod assist, +2 safety/equipment Gait Distance (Feet): 3 Feet Assistive device: Rolling walker (2 wheels) Gait Pattern/deviations: Step-to pattern Gait velocity: decreased     General Gait Details: very slow cautious steps. limited wt toilerance on LLE.   Stairs             Wheelchair Mobility     Tilt Bed Tilt Bed Patient Response: Cooperative  Modified Rankin (Stroke Patients Only)       Balance Overall balance assessment: Needs assistance Sitting-balance support: No upper extremity supported, Feet supported Sitting balance-Leahy Scale: Good  Standing balance support: Bilateral upper extremity supported Standing balance-Leahy Scale: Fair Standing balance comment: reliant on BUE support. severely flexed posture in standing but with VCs is able to correct                            Cognition Arousal: Alert Behavior During Therapy: Impulsive, WFL for  tasks assessed/performed Overall Cognitive Status: Within Functional Limits for tasks assessed                                 General Comments: daughter in for session later this am and more cooperative for mobiltiy        Exercises      General Comments        Pertinent Vitals/Pain Pain Assessment Pain Assessment: Faces Faces Pain Scale: Hurts even more Pain Location: L lateral knee Pain Descriptors / Indicators: Aching, Discomfort, Guarding Pain Intervention(s): Limited activity within patient's tolerance, Monitored during session, Repositioned    Home Living                          Prior Function            PT Goals (current goals can now be found in the care plan section) Progress towards PT goals: Progressing toward goals    Frequency    7X/week      PT Plan      Co-evaluation              AM-PAC PT 6 Clicks Mobility   Outcome Measure  Help needed turning from your back to your side while in a flat bed without using bedrails?: A Lot Help needed moving from lying on your back to sitting on the side of a flat bed without using bedrails?: A Lot Help needed moving to and from a bed to a chair (including a wheelchair)?: A Lot Help needed standing up from a chair using your arms (e.g., wheelchair or bedside chair)?: A Lot Help needed to walk in hospital room?: Total Help needed climbing 3-5 steps with a railing? : Total 6 Click Score: 10    End of Session Equipment Utilized During Treatment: Left knee immobilizer Activity Tolerance: Patient tolerated treatment well;Patient limited by pain Patient left: with call bell/phone within reach;in chair;with chair alarm set;with family/visitor present Nurse Communication: Mobility status;Precautions;Weight bearing status;Other (comment) PT Visit Diagnosis: Other abnormalities of gait and mobility (R26.89);Muscle weakness (generalized) (M62.81);History of falling (Z91.81);Pain Pain  - Right/Left: Left Pain - part of body: Knee     Time: 8841-8784 PT Time Calculation (min) (ACUTE ONLY): 17 min  Charges:    $Therapeutic Activity: 8-22 mins PT General Charges $$ ACUTE PT VISIT: 1 Visit                   Lauraine Gills, PTA 12/25/23, 1:17 PM

## 2023-12-25 NOTE — Progress Notes (Signed)
 Progress Note   Patient: Michael Delacruz FMW:[id number] DOB: Apr 23, 1941 DOA: 12/21/2023     4 DOS: the patient was seen and examined on 12/25/2023   Brief hospital course: 83 year old man past medical history of CAD, stroke, chronic ambulation dysfunction, chronic heart failure reduced ejection fraction, mitral valve repair, pacemaker, COPD presented with a fall and left knee pain.  Patient was found to have an obliquely oriented fracture involving the lateral aspect of the distal left femur.  1/2.  Patient seen prior to operation.  He does have occasional chest pains but none when I saw him.  No shortness of breath.  Had a mechanical fall. 1/3.  Postoperative day 1 for left distal femur open reduction internal fixation with plate and screws.  Patient does have some pain. 1/4.  Postoperative day 2.  Reviewed medications with wife he does not take anything at home besides as needed Lasix .  Will cut back on IV pain meds and switch over to oral.  Assessment and Plan: * Closed fracture of left distal femur (HCC) Postoperative day 3 for open reduction internal fixation of distal femur fracture.  Discontinue IV pain medication and switch over to oral plus Tylenol .  Appreciate PT evaluation.  Will need rehab.  Aspirin  for DVT prophylaxis.  Confusion Could be from pain medications and/or anesthesia.  Will convert pain medications over to oral plus Tylenol .  Reviewed medications with wife he does not take Zoloft  and Seroquel  at home we will get rid of these medications which could be continued contributing to confusion.  Patient also in an unfamiliar environment which could contribute to confusion.  Atypical chest pain Atypical nature.  Echocardiogram showed an EF of 50%.  Troponins were negative.  On low-dose Toprol .  Hypokalemia Replaced  Chronic heart failure with mildly reduced ejection fraction (HFmrEF, 41-49%) (HCC) Patient euvolemic.  EF improved to 50%.  Patient on low-dose  Toprol .  Permanent atrial fibrillation (HCC) Aspirin  added.  Patient did not want anticoagulation previously.  Thrombocytopenia (HCC) Platelet count 125  CKD (chronic kidney disease), stage III (HCC) CKD stage IIIa with creatinine 1.03 with a GFR greater than 60.  Anxiety Patient does not take Zoloft  and Seroquel  at home.  Will discontinue these medications since these can contribute to confusion.  COPD (chronic obstructive pulmonary disease) (HCC) Respiratory status stable        Subjective: Patient seen this morning.  Answer questions appropriately.  Family noticed that his mental status is not the same as at home.  Patient on pain medications and had anesthesia and being in an unfamiliar environment.  Reviewed that he does not take Zoloft  or Seroquel  at home.  Will discontinue these medications.  Physical Exam: Vitals:   12/24/23 0416 12/24/23 0724 12/24/23 2336 12/25/23 0855  BP: 110/73 109/67 136/78 131/76  Pulse: 84 84 84 83  Resp: 20 17 20 18   Temp: 98 F (36.7 C) 98.1 F (36.7 C) 99 F (37.2 C) 98.3 F (36.8 C)  TempSrc:   Oral Oral  SpO2: 98% 97% 95% 95%  Weight:      Height:       Physical Exam HENT:     Head: Normocephalic.     Mouth/Throat:     Pharynx: No oropharyngeal exudate.  Eyes:     General: Lids are normal.     Conjunctiva/sclera: Conjunctivae normal.  Cardiovascular:     Rate and Rhythm: Normal rate. Rhythm irregularly irregular.     Heart sounds: Normal heart sounds, S1 normal and  S2 normal.  Pulmonary:     Breath sounds: No decreased breath sounds, wheezing, rhonchi or rales.  Abdominal:     Palpations: Abdomen is soft.     Tenderness: There is no abdominal tenderness.  Musculoskeletal:     Right lower leg: No swelling.     Left lower leg: No swelling.  Skin:    General: Skin is warm.     Findings: No rash.  Neurological:     Mental Status: He is alert. He is confused.     Comments: Good feeling bilateral feet.  Able to wiggle  toes bilaterally.     Data Reviewed: Hemoglobin 11.1, platelet count 125, creatinine 1.03  Family Communication: Spoke to the wife on the phone  Disposition: Status is: Inpatient Remains inpatient appropriate because: Will need a rehab bed and insurance authorization  Planned Discharge Destination: Rehab    Time spent: 28 minutes  Author: Charlie Patterson, MD 12/25/2023 2:55 PM  For on call review [email or web address].

## 2023-12-26 DIAGNOSIS — F419 Anxiety disorder, unspecified: Secondary | ICD-10-CM

## 2023-12-26 DIAGNOSIS — R410 Disorientation, unspecified: Secondary | ICD-10-CM | POA: Diagnosis not present

## 2023-12-26 DIAGNOSIS — R0789 Other chest pain: Secondary | ICD-10-CM

## 2023-12-26 DIAGNOSIS — E876 Hypokalemia: Secondary | ICD-10-CM

## 2023-12-26 DIAGNOSIS — K121 Other forms of stomatitis: Secondary | ICD-10-CM | POA: Insufficient documentation

## 2023-12-26 DIAGNOSIS — S72402S Unspecified fracture of lower end of left femur, sequela: Secondary | ICD-10-CM

## 2023-12-26 DIAGNOSIS — N179 Acute kidney failure, unspecified: Secondary | ICD-10-CM | POA: Insufficient documentation

## 2023-12-26 DIAGNOSIS — I4821 Permanent atrial fibrillation: Secondary | ICD-10-CM

## 2023-12-26 DIAGNOSIS — D696 Thrombocytopenia, unspecified: Secondary | ICD-10-CM

## 2023-12-26 DIAGNOSIS — H5711 Ocular pain, right eye: Secondary | ICD-10-CM | POA: Insufficient documentation

## 2023-12-26 DIAGNOSIS — J439 Emphysema, unspecified: Secondary | ICD-10-CM

## 2023-12-26 DIAGNOSIS — I5022 Chronic systolic (congestive) heart failure: Secondary | ICD-10-CM

## 2023-12-26 DIAGNOSIS — N189 Chronic kidney disease, unspecified: Secondary | ICD-10-CM | POA: Insufficient documentation

## 2023-12-26 LAB — BASIC METABOLIC PANEL
Anion gap: 12 (ref 5–15)
BUN: 16 mg/dL (ref 8–23)
CO2: 21 mmol/L — ABNORMAL LOW (ref 22–32)
Calcium: 8.5 mg/dL — ABNORMAL LOW (ref 8.9–10.3)
Chloride: 100 mmol/L (ref 98–111)
Creatinine, Ser: 0.99 mg/dL (ref 0.61–1.24)
GFR, Estimated: >60 mL/min (ref >60)
Glucose, Bld: 169 mg/dL — ABNORMAL HIGH (ref 70–99)
Potassium: 3.6 mmol/L (ref 3.5–5.1)
Sodium: 133 mmol/L — ABNORMAL LOW (ref 135–145)

## 2023-12-26 LAB — CBC
HCT: 31.3 % — ABNORMAL LOW (ref 39.0–52.0)
Hemoglobin: 10.9 g/dL — ABNORMAL LOW (ref 13.0–17.0)
MCH: 31.4 pg (ref 26.0–34.0)
MCHC: 34.8 g/dL (ref 30.0–36.0)
MCV: 90.2 fL (ref 80.0–100.0)
Platelets: 164 10*3/uL (ref 150–400)
RBC: 3.47 MIL/uL — ABNORMAL LOW (ref 4.22–5.81)
RDW: 12.3 % (ref 11.5–15.5)
WBC: 5.1 10*3/uL (ref 4.0–10.5)
nRBC: 0 % (ref 0.0–0.2)

## 2023-12-26 LAB — SEDIMENTATION RATE: Sed Rate: 61 mm/h — ABNORMAL HIGH (ref 0–20)

## 2023-12-26 MED ORDER — POLYETHYLENE GLYCOL 3350 17 G PO PACK
17.0000 g | PACK | Freq: Every day | ORAL | Status: DC
  Administered 2023-12-28 – 2023-12-29 (×2): 17 g via ORAL
  Filled 2023-12-26 (×2): qty 1

## 2023-12-26 MED ORDER — MAGIC MOUTHWASH W/LIDOCAINE
5.0000 mL | Freq: Three times a day (TID) | ORAL | Status: DC
  Administered 2023-12-26 – 2023-12-29 (×10): 5 mL via ORAL
  Filled 2023-12-26 (×10): qty 5

## 2023-12-26 MED ORDER — MAGIC MOUTHWASH W/LIDOCAINE: 5.0000 mL | Freq: Three times a day (TID) | ORAL | Status: DC | PRN

## 2023-12-26 NOTE — Care Management Important Message (Signed)
 Important Message  Patient Details  Name: Michael Delacruz MRN: [id number] Date of Birth: [birth year]   Important Message Given:  Yes - Medicare IM     Cristela Blue, CMA 12/26/2023, 11:02 AM

## 2023-12-26 NOTE — Assessment & Plan Note (Signed)
 Advised to keep upper denture out of his mouth while he is not eating.  Will give Magic mouthwash prior to eating.  Could also be thrush underneath the denture.

## 2023-12-26 NOTE — Assessment & Plan Note (Signed)
 AKI on CKD stage II.  Creatinine 1.29 on 1/3.  Creatinine 0.99 on 1/6.

## 2023-12-26 NOTE — Plan of Care (Signed)

## 2023-12-26 NOTE — Progress Notes (Signed)
 Progress Note   Patient: Michael Delacruz FMW:[id number] DOB: 11/23/1941 DOA: 12/21/2023     5 DOS: the patient was seen and examined on 12/26/2023   Brief hospital course: 83 year old man past medical history of CAD, stroke, chronic ambulation dysfunction, chronic heart failure reduced ejection fraction, mitral valve repair, pacemaker, COPD presented with a fall and left knee pain.  Patient was found to have an obliquely oriented fracture involving the lateral aspect of the distal left femur.  1/2.  Patient seen prior to operation.  He does have occasional chest pains but none when I saw him.  No shortness of breath.  Had a mechanical fall. 1/3.  Postoperative day 1 for left distal femur open reduction internal fixation with plate and screws.  Patient does have some pain. 1/4.  Postoperative day 2.  Patient with some confusion today. 1/5.  Postoperative day 3.  Reviewed medications with wife he does not take any medications at home.  Discontinued medications that he is not taking.  Discontinued IV pain meds and switch over to oral. 1/6.  Postoperative day 4.  Patient complaining of right eye pain going on for 4 months even as outpatient.  Wife mentioned that he is not eating as much because he has some pain in his mouth.  Has an ulcer underneath his denture.  Assessment and Plan: * Closed fracture of left distal femur (HCC) Postoperative day 4 for open reduction internal fixation of distal femur fracture.  Oral pain medication, Tylenol .  Appreciate PT evaluation.  Will need rehab.  Aspirin  for DVT prophylaxis.  Acute delirium Could be from pain medications, anesthesia and other medications that he is not taking.  Discontinued IV pain medications and medications that he is not taking at home.  Mental status improved today.  Atypical chest pain Atypical nature.  Echocardiogram showed an EF of 50%.  Troponins were negative.  Discontinue Toprol .  Hypokalemia Replaced  Chronic heart failure with  mildly reduced ejection fraction (HFmrEF, 41-49%) (HCC) Patient euvolemic.  EF improved to 50%.   Permanent atrial fibrillation (HCC) Aspirin  added.  Patient did not want anticoagulation previously.  Also get rid of Toprol  which was started preop.  Eye pain, right This has been going on for 4 months.  Case discussed with ophthalmology here and can follow-up as outpatient upon discharge from the hospital.  Patient's wife states he has a ophthalmologist in [place].  Mouth ulcer Advised to keep upper denture out of his mouth while he is not eating.  Will give Magic mouthwash prior to eating.  Could also be thrush underneath the denture.  Acute kidney injury superimposed on CKD (HCC) AKI on CKD stage II.  Creatinine 1.29 on 1/3.  Creatinine 0.99 on 1/6.  Thrombocytopenia (HCC) Platelet count up to 164  Anxiety Patient does not take any medications at home.  Medications discontinued yesterday.  COPD (chronic obstructive pulmonary disease) (HCC) Respiratory status stable        Subjective: Patient telling me that on and off for 4 months he has been having right thigh pain.  Ever since his stroke he has not been able to see out of the right eye.  Spoke with the eye doctor and they said they can follow him after discharge.  Patient's wife states that he also follows with an eye doctor in [place].  Wife concerned that he is not eating and he has some mouth pain.  He does have an ulcer underneath his top denture left upper mouth.  Physical Exam:  Vitals:   12/25/23 0855 12/25/23 1604 12/26/23 0107 12/26/23 0808  BP: 131/76 131/77 (!) 144/82 (!) 135/95  Pulse: 83 84 83 85  Resp: 18 17 (!) 22 16  Temp: 98.3 F (36.8 C) 98.2 F (36.8 C) 98.2 F (36.8 C) 98.2 F (36.8 C)  TempSrc: Oral  Oral Oral  SpO2: 95% 97% 97% 96%  Weight:      Height:       Physical Exam HENT:     Head: Normocephalic.     Mouth/Throat:     Comments: After pulling out his denture he does have a ulcer on her  left upper mouth with some thrush underneath the denture also. Eyes:     General: Lids are normal.     Conjunctiva/sclera: Conjunctivae normal.  Cardiovascular:     Rate and Rhythm: Normal rate. Rhythm irregularly irregular.     Heart sounds: Normal heart sounds, S1 normal and S2 normal.  Pulmonary:     Breath sounds: No decreased breath sounds, wheezing, rhonchi or rales.  Abdominal:     Palpations: Abdomen is soft.     Tenderness: There is no abdominal tenderness.  Musculoskeletal:     Right lower leg: No swelling.     Left lower leg: No swelling.  Skin:    General: Skin is warm.     Findings: No rash.  Neurological:     Mental Status: He is alert. He is confused.     Comments: Good feeling bilateral feet.  Able to wiggle toes bilaterally.     Data Reviewed: Platelet count 164, hemoglobin 10.9, white blood cell count 5.1, creatinine 0.99, sodium 133  Family Communication: Spoke with wife on the phone  Disposition: Status is: Inpatient Remains inpatient appropriate because: Will need rehab bed and insurance authorization  Planned Discharge Destination: Rehab    Time spent: 34 minutes Case discussed with ophthalmology  Author: Charlie Patterson, MD 12/26/2023 3:54 PM  For on call review [email or web address].

## 2023-12-26 NOTE — Progress Notes (Addendum)
 No ICM remote transmission received for 12/26/2023 due to current hospitalization and next ICM transmission scheduled for 01/09/2024.

## 2023-12-26 NOTE — Assessment & Plan Note (Signed)
 This has been going on for 4 months.  Case discussed with ophthalmology here and can follow-up as outpatient upon discharge from the hospital.  Patient's wife states he has a ophthalmologist in [place].

## 2023-12-26 NOTE — Progress Notes (Signed)
 Physical Therapy Treatment Patient Details Name: Michael Delacruz MRN: [id number] DOB: 10-09-1941 Today's Date: 12/26/2023   History of Present Illness Pt is an 83 y.o. male presenting to hospital 12/21/23 after tripping over a Christmas toy and falling to ground hitting head and L knee; imaging showing acute vertically oriented fracture of the distal femur with intra-articular extension through the lateral trochlea and lateral femoral condyle as well as a lipohemarthrosis of the left knee.  Pt with chest pains and hypokalemia.  Pt s/p L distal femur ORIF with plate and screws 12/22/23.  PMH includes CAD, a-fib, CHF, COPD, stroke, blindness R eye, mitral valve repair, PPM, AAA, bradycardia, fibromyalgia, L ankle sx.    PT Comments  Pt received seated on EOB with RN with urgency to get to bathroom. This clinical research associate took over session and pt able to ambulate in room to bathroom. Pt elected to use low toilet seat as he wasn't able to wait for therapist to set up [facility] for higher seat. Mod assist for getting up/down, however pt able to perform self hygiene. Once seated in recliner, able to perform LAQs on B LE and KI adjusted. Will continue to progress towards goals with great progress noted this session.    If plan is discharge home, recommend the following: Assistance with cooking/housework;Direct supervision/assist for medications management;Direct supervision/assist for financial management;Assist for transportation;Help with stairs or ramp for entrance;Supervision due to cognitive status;Two people to help with walking and/or transfers;Two people to help with bathing/dressing/bathroom   Can travel by private vehicle     Yes  Equipment Recommendations   (TBD)    Recommendations for Other Services       Precautions / Restrictions Precautions Precautions: Fall Precaution Comments: knee immobilizer for all OOB. Can removed while in bed Required Braces or Orthoses: Knee Immobilizer - Left Knee Immobilizer -  Left: On at all times Restrictions Weight Bearing Restrictions Per Provider Order: Yes LLE Weight Bearing Per Provider Order: Weight bearing as tolerated     Mobility  Bed Mobility               General bed mobility comments: NT, received sitting at EOB    Transfers Overall transfer level: Needs assistance Equipment used: Rolling walker (2 wheels) Transfers: Sit to/from Stand Sit to Stand: Min assist           General transfer comment: Cues for hand placement. Once standing, good weight acceptance noted on L LE. KI donned    Ambulation/Gait Ambulation/Gait assistance: Min assist Gait Distance (Feet): 30 Feet Assistive device: Rolling walker (2 wheels) Gait Pattern/deviations: Step-to pattern       General Gait Details: able to ambulate around bed to bathroom. Slow step to gait pattern performed. RW used. Slight dizziness noted   Stairs             Wheelchair Mobility     Tilt Bed    Modified Rankin (Stroke Patients Only)       Balance Overall balance assessment: Needs assistance Sitting-balance support: No upper extremity supported, Feet supported Sitting balance-Leahy Scale: Good     Standing balance support: Bilateral upper extremity supported Standing balance-Leahy Scale: Fair                              Cognition Arousal: Alert Behavior During Therapy: Impulsive, WFL for tasks assessed/performed Overall Cognitive Status: Within Functional Limits for tasks assessed  General Comments: pleasant and agreeable to session        Exercises Other Exercises Other Exercises: ambulated to bathroom and able to perform self hygiene with supervision. Does need mod assist to stand/lower from low toilet    General Comments        Pertinent Vitals/Pain Pain Assessment Pain Assessment: Faces Faces Pain Scale: Hurts little more Pain Location: L lateral knee Pain Descriptors /  Indicators: Aching, Discomfort, Guarding Pain Intervention(s): Limited activity within patient's tolerance, Repositioned    Home Living                          Prior Function            PT Goals (current goals can now be found in the care plan section) Acute Rehab PT Goals Patient Stated Goal: none stated PT Goal Formulation: With patient Time For Goal Achievement: 01/06/24 Potential to Achieve Goals: Good Progress towards PT goals: Progressing toward goals    Frequency    7X/week      PT Plan      Co-evaluation              AM-PAC PT 6 Clicks Mobility   Outcome Measure  Help needed turning from your back to your side while in a flat bed without using bedrails?: A Little Help needed moving from lying on your back to sitting on the side of a flat bed without using bedrails?: A Little Help needed moving to and from a bed to a chair (including a wheelchair)?: A Little Help needed standing up from a chair using your arms (e.g., wheelchair or bedside chair)?: A Lot Help needed to walk in hospital room?: A Little Help needed climbing 3-5 steps with a railing? : Total 6 Click Score: 15    End of Session Equipment Utilized During Treatment: Left knee immobilizer Activity Tolerance: Patient tolerated treatment well;Patient limited by pain Patient left: in chair;with chair alarm set Nurse Communication: Mobility status;Precautions;Weight bearing status;Other (comment) PT Visit Diagnosis: Other abnormalities of gait and mobility (R26.89);Muscle weakness (generalized) (M62.81);History of falling (Z91.81);Pain Pain - Right/Left: Left Pain - part of body: Knee     Time: 8656-8590 PT Time Calculation (min) (ACUTE ONLY): 26 min  Charges:    $Gait Training: 8-22 mins $Therapeutic Activity: 8-22 mins PT General Charges $$ ACUTE PT VISIT: 1 Visit                     [name], PT, DPT, GCS [phone]    Myshawn Chiriboga 12/26/2023, 3:25 PM

## 2023-12-27 DIAGNOSIS — R0789 Other chest pain: Secondary | ICD-10-CM | POA: Diagnosis not present

## 2023-12-27 DIAGNOSIS — S72402S Unspecified fracture of lower end of left femur, sequela: Secondary | ICD-10-CM | POA: Diagnosis not present

## 2023-12-27 DIAGNOSIS — R410 Disorientation, unspecified: Secondary | ICD-10-CM | POA: Diagnosis not present

## 2023-12-27 DIAGNOSIS — E876 Hypokalemia: Secondary | ICD-10-CM | POA: Diagnosis not present

## 2023-12-27 NOTE — Progress Notes (Signed)
 Progress Note   Patient: Michael Delacruz FMW:[id number] DOB: 1941/12/02 DOA: 12/21/2023     6 DOS: the patient was seen and examined on 12/27/2023   Brief hospital course: 83 year old man past medical history of CAD, stroke, chronic ambulation dysfunction, chronic heart failure reduced ejection fraction, mitral valve repair, pacemaker, COPD presented with a fall and left knee pain.  Patient was found to have an obliquely oriented fracture involving the lateral aspect of the distal left femur.  1/2.  Patient seen prior to operation.  He does have occasional chest pains but none when I saw him.  No shortness of breath.  Had a mechanical fall. 1/3.  Postoperative day 1 for left distal femur open reduction internal fixation with plate and screws.  Patient does have some pain. 1/4.  Postoperative day 2.  Patient with some confusion today. 1/5.  Postoperative day 3.  Reviewed medications with wife he does not take any medications at home.  Discontinued medications that he is not taking.  Discontinued IV pain meds and switch over to oral. 1/6.  Postoperative day 4.  Patient complaining of right eye pain going on for 4 months even as outpatient.  Wife mentioned that he is not eating as much because he has some pain in his mouth.  Has an ulcer underneath his denture.  Assessment and Plan: * Closed fracture of left distal femur (HCC) Postoperative day 5 for open reduction internal fixation of distal femur fracture.  Oral pain medication, Tylenol .  Appreciate PT evaluation.  Will need rehab.  Aspirin  for DVT prophylaxis.  Acute delirium Could be from pain medications, anesthesia and other medications that he is not taking.  Discontinued IV pain medications and medications that he is not taking at home.  Mental status improved the last 2 days.  Atypical chest pain Atypical nature.  Echocardiogram showed an EF of 50%.  Troponins were negative.  Discontinued Toprol .  Hypokalemia Replaced  Chronic heart  failure with mildly reduced ejection fraction (HFmrEF, 41-49%) (HCC) Patient euvolemic.  EF improved to 50%.   Permanent atrial fibrillation (HCC) Aspirin  added for DVT prophylaxis.  Patient did not want anticoagulation previously.  Also got rid of Toprol  which was started preop.  Eye pain, right This has been going on for 4 months.  Case discussed with ophthalmology here and can follow-up as outpatient upon discharge from the hospital.  Patient's wife states he has a ophthalmologist in [place].  Mouth ulcer Advised to keep upper denture out of his mouth while he is not eating.  Will give Magic mouthwash prior to eating.  Could also be thrush underneath the denture.  Acute kidney injury superimposed on CKD (HCC) AKI on CKD stage II.  Creatinine 1.29 on 1/3.  Creatinine 0.99 on 1/6.  Thrombocytopenia (HCC) Platelet count up to 164  Anxiety Patient does not take any medications at home.  COPD (chronic obstructive pulmonary disease) (HCC) Respiratory status stable        Subjective: Patient seen before breakfast and still having some mouth pain.  Patient walked 30 feet yesterday and 15 feet today.  Mental status improved from a few days ago.  Physical Exam: Vitals:   12/26/23 0808 12/26/23 1653 12/27/23 0008 12/27/23 0857  BP: (!) 135/95 139/85 132/86 (!) 142/81  Pulse: 85 88 84 84  Resp: 16 17 20 19   Temp: 98.2 F (36.8 C) 98.1 F (36.7 C) 98.5 F (36.9 C) 98 F (36.7 C)  TempSrc: Oral Oral Oral Oral  SpO2: 96% 98%  97% 96%  Weight:      Height:       Physical Exam HENT:     Head: Normocephalic.     Mouth/Throat:     Comments: After pulling out his denture he does have a ulcer on her left upper mouth with some thrush underneath the denture also. Eyes:     General: Lids are normal.     Conjunctiva/sclera: Conjunctivae normal.  Cardiovascular:     Rate and Rhythm: Normal rate. Rhythm irregularly irregular.     Heart sounds: Normal heart sounds, S1 normal and S2  normal.  Pulmonary:     Breath sounds: No decreased breath sounds, wheezing, rhonchi or rales.  Abdominal:     Palpations: Abdomen is soft.     Tenderness: There is no abdominal tenderness.  Musculoskeletal:     Right lower leg: No swelling.     Left lower leg: No swelling.  Skin:    General: Skin is warm.     Findings: No rash.  Neurological:     Mental Status: He is alert. He is confused.     Comments: Good feeling bilateral feet.  Able to wiggle toes bilaterally.     Data Reviewed: Sodium 133, creatinine 0.99, white blood cell count 5.1, hemoglobin 10.9, platelet count 164, sedimentation rate 61.  Family Communication: Spoke with wife on the phone  Disposition: Status is: Inpatient Remains inpatient appropriate because: Will need a rehab bed and insurance authorization  Planned Discharge Destination: Rehab    Time spent: 28 minutes  Author: Charlie Patterson, MD 12/27/2023 3:00 PM  For on call review [email or web address].

## 2023-12-27 NOTE — TOC Progression Note (Signed)
 Transition of Care [facility]) - Progression Note    Patient Details  Name: Michael Delacruz MRN: [id number] Date of Birth: 04-21-1941  Transition of [facility]) CM/SW Contact  Royanne JINNY Bernheim, RN Phone Number: 12/27/2023, 3:20 PM  Clinical Narrative:    Met with the patient to review the one bed offer they have at [facility] in [place], At this time that is the only bed offer, I offered to extend the bedsearch out of the local area  His wife stated she will consider [facility] and let me know       Expected Discharge Plan and Services                                               Social Determinants of Health (SDOH) Interventions SDOH Screenings   Food Insecurity: Food Insecurity Present (12/21/2023)  Housing: Low Risk  (12/21/2023)  Transportation Needs: No Transportation Needs (12/21/2023)  Utilities: Not At Risk (12/21/2023)  Alcohol Screen: Low Risk  (01/28/2023)  Depression (PHQ2-9): Low Risk  (01/28/2023)  Financial Resource Strain: Medium Risk (10/26/2023)  Physical Activity: Inactive (01/28/2023)  Social Connections: Moderately Isolated (12/21/2023)  Stress: No Stress Concern Present (01/28/2023)  Tobacco Use: High Risk (12/21/2023)  Health Literacy: Adequate Health Literacy (10/26/2023)    Readmission Risk Interventions     No data to display

## 2023-12-27 NOTE — Plan of Care (Signed)

## 2023-12-27 NOTE — Plan of Care (Signed)

## 2023-12-27 NOTE — Progress Notes (Signed)
 Physical Therapy Treatment Patient Details Name: Michael Delacruz MRN: [id number] DOB: 1941-09-04 Today's Date: 12/27/2023   History of Present Illness Pt is an 83 y.o. male presenting to hospital 12/21/23 after tripping over a Christmas toy and falling to ground hitting head and L knee; imaging showing acute vertically oriented fracture of the distal femur with intra-articular extension through the lateral trochlea and lateral femoral condyle as well as a lipohemarthrosis of the left knee.  Pt with chest pains and hypokalemia.  Pt s/p L distal femur ORIF with plate and screws 12/22/23.  PMH includes CAD, a-fib, CHF, COPD, stroke, blindness R eye, mitral valve repair, PPM, AAA, bradycardia, fibromyalgia, L ankle sx.    PT Comments  Pt is making good progress towards goals with ability to ambulate in room, limited by pain this date. Able to ambulate with antalgic gait pattern with RW used. Will continue to progress.   If plan is discharge home, recommend the following: Assistance with cooking/housework;Direct supervision/assist for medications management;Direct supervision/assist for financial management;Assist for transportation;Help with stairs or ramp for entrance;Supervision due to cognitive status;Two people to help with walking and/or transfers;Two people to help with bathing/dressing/bathroom   Can travel by private vehicle     Yes  Equipment Recommendations  Other (comment) (TBD)    Recommendations for Other Services OT consult     Precautions / Restrictions Precautions Precautions: Fall Precaution Comments: knee immobilizer for all OOB. Can removed while in bed Required Braces or Orthoses: Knee Immobilizer - Left Knee Immobilizer - Left: On at all times Restrictions Weight Bearing Restrictions Per Provider Order: Yes LLE Weight Bearing Per Provider Order: Weight bearing as tolerated     Mobility  Bed Mobility Overal bed mobility: Needs Assistance Bed Mobility: Sit to Supine        Sit to supine: Mod assist   General bed mobility comments: needs assist for B LEs and repositioning in bed    Transfers Overall transfer level: Needs assistance Equipment used: Rolling walker (2 wheels) Transfers: Sit to/from Stand Sit to Stand: Mod assist           General transfer comment: needs mod assist for rising from low recliner. RW used and good weight acceptance once standing.    Ambulation/Gait Ambulation/Gait assistance: Min assist Gait Distance (Feet): 15 Feet Assistive device: Rolling walker (2 wheels) Gait Pattern/deviations: Step-to pattern       General Gait Details: antalgic gait pattern with slow step to technique. Heavy use of B UE during ambulation   Stairs             Wheelchair Mobility     Tilt Bed    Modified Rankin (Stroke Patients Only)       Balance Overall balance assessment: Needs assistance Sitting-balance support: No upper extremity supported, Feet supported Sitting balance-Leahy Scale: Good     Standing balance support: Bilateral upper extremity supported Standing balance-Leahy Scale: Fair                              Cognition Arousal: Alert Behavior During Therapy: WFL for tasks assessed/performed Overall Cognitive Status: Within Functional Limits for tasks assessed                                 General Comments: agreeable to session, limited by pain this date        Exercises  General Comments        Pertinent Vitals/Pain Pain Assessment Pain Assessment: Faces Faces Pain Scale: Hurts little more Pain Location: L lateral knee Pain Descriptors / Indicators: Aching, Discomfort, Guarding Pain Intervention(s): Limited activity within patient's tolerance, Repositioned    Home Living                          Prior Function            PT Goals (current goals can now be found in the care plan section) Acute Rehab PT Goals Patient Stated Goal: none  stated PT Goal Formulation: With patient Time For Goal Achievement: 01/06/24 Potential to Achieve Goals: Good Progress towards PT goals: Progressing toward goals    Frequency    7X/week      PT Plan      Co-evaluation              AM-PAC PT 6 Clicks Mobility   Outcome Measure  Help needed turning from your back to your side while in a flat bed without using bedrails?: A Little Help needed moving from lying on your back to sitting on the side of a flat bed without using bedrails?: A Little Help needed moving to and from a bed to a chair (including a wheelchair)?: A Lot Help needed standing up from a chair using your arms (e.g., wheelchair or bedside chair)?: A Lot Help needed to walk in hospital room?: A Lot Help needed climbing 3-5 steps with a railing? : A Lot 6 Click Score: 14    End of Session Equipment Utilized During Treatment: Left knee immobilizer Activity Tolerance: Patient tolerated treatment well;Patient limited by pain Patient left: in bed;with bed alarm set Nurse Communication: Mobility status PT Visit Diagnosis: Other abnormalities of gait and mobility (R26.89);Muscle weakness (generalized) (M62.81);History of falling (Z91.81);Pain Pain - Right/Left: Left Pain - part of body: Knee     Time: 1341-1359 PT Time Calculation (min) (ACUTE ONLY): 18 min  Charges:    $Gait Training: 8-22 mins PT General Charges $$ ACUTE PT VISIT: 1 Visit                     Michael Delacruz, PT, DPT, GCS [phone]    Michael Delacruz 12/27/2023, 2:17 PM

## 2023-12-28 DIAGNOSIS — K121 Other forms of stomatitis: Secondary | ICD-10-CM

## 2023-12-28 DIAGNOSIS — R410 Disorientation, unspecified: Secondary | ICD-10-CM | POA: Diagnosis not present

## 2023-12-28 DIAGNOSIS — S72402A Unspecified fracture of lower end of left femur, initial encounter for closed fracture: Secondary | ICD-10-CM | POA: Diagnosis not present

## 2023-12-28 LAB — CBC
HCT: 37.2 % — ABNORMAL LOW (ref 39.0–52.0)
Hemoglobin: 12.3 g/dL — ABNORMAL LOW (ref 13.0–17.0)
MCH: 30.8 pg (ref 26.0–34.0)
MCHC: 33.1 g/dL (ref 30.0–36.0)
MCV: 93 fL (ref 80.0–100.0)
Platelets: 289 K/uL (ref 150–400)
RBC: 4 MIL/uL — ABNORMAL LOW (ref 4.22–5.81)
RDW: 12.2 % (ref 11.5–15.5)
WBC: 7.2 K/uL (ref 4.0–10.5)
nRBC: 0 % (ref 0.0–0.2)

## 2023-12-28 LAB — BASIC METABOLIC PANEL WITH GFR
Anion gap: 12 (ref 5–15)
BUN: 16 mg/dL (ref 8–23)
CO2: 22 mmol/L (ref 22–32)
Calcium: 9.2 mg/dL (ref 8.9–10.3)
Chloride: 103 mmol/L (ref 98–111)
Creatinine, Ser: 1.07 mg/dL (ref 0.61–1.24)
GFR, Estimated: >60 mL/min (ref >60)
Glucose, Bld: 111 mg/dL — ABNORMAL HIGH (ref 70–99)
Potassium: 4 mmol/L (ref 3.5–5.1)
Sodium: 137 mmol/L (ref 135–145)

## 2023-12-28 NOTE — Progress Notes (Signed)
 Occupational Therapy Treatment Patient Details Name: Michael Delacruz MRN: [id number] DOB: 05/10/1941 Today's Date: 12/28/2023   History of present illness Pt is an 83 y.o. male presenting to hospital 12/21/23 after tripping over a Christmas toy and falling to ground hitting head and L knee; imaging showing acute vertically oriented fracture of the distal femur with intra-articular extension through the lateral trochlea and lateral femoral condyle as well as a lipohemarthrosis of the left knee.  Pt with chest pains and hypokalemia.  Pt s/p L distal femur ORIF with plate and screws 12/22/23.  PMH includes CAD, a-fib, CHF, COPD, stroke, blindness R eye, mitral valve repair, PPM, AAA, bradycardia, fibromyalgia, L ankle sx.   OT comments  Mr Saadeh was seen for OT treatment on this date. Upon arrival to room pt in bed, agreeable to tx. Pt requires MIN A + RW for toilet t/f. SBA for seated pericare and standing grooming tasks. Left in chair with alarm. Pt making good progress toward goals, will continue to follow POC. Discharge recommendation remains appropriate.        If plan is discharge home, recommend the following:  A little help with walking and/or transfers;A little help with bathing/dressing/bathroom   Equipment Recommendations  BSC/3in1    Recommendations for Other Services      Precautions / Restrictions Precautions Precautions: Fall Restrictions Weight Bearing Restrictions Per Provider Order: Yes LLE Weight Bearing Per Provider Order: Weight bearing as tolerated       Mobility Bed Mobility Overal bed mobility: Needs Assistance Bed Mobility: Supine to Sit     Supine to sit: Min assist          Transfers Overall transfer level: Needs assistance Equipment used: Rolling walker (2 wheels) Transfers: Sit to/from Stand Sit to Stand: Min assist                 Balance Overall balance assessment: Needs assistance Sitting-balance support: No upper extremity supported,  Feet supported Sitting balance-Leahy Scale: Good     Standing balance support: No upper extremity supported, During functional activity Standing balance-Leahy Scale: Fair                             ADL either performed or assessed with clinical judgement   ADL Overall ADL's : Needs assistance/impaired                                       General ADL Comments: MIN A + RW for toilet t/f. SBA for seated pericare and standing grooming tasks      Cognition Arousal: Alert Behavior During Therapy: WFL for tasks assessed/performed Overall Cognitive Status: Within Functional Limits for tasks assessed                                                     Pertinent Vitals/ Pain       Pain Assessment Pain Assessment: Faces Faces Pain Scale: Hurts little more Pain Location: L lateral knee Pain Descriptors / Indicators: Aching, Discomfort, Guarding Pain Intervention(s): Limited activity within patient's tolerance, Repositioned   Frequency  Min 1X/week        Progress Toward Goals  OT Goals(current goals can now be found in  the care plan section)  Progress towards OT goals: Progressing toward goals  Acute Rehab OT Goals OT Goal Formulation: With patient Time For Goal Achievement: 01/06/24 Potential to Achieve Goals: Good ADL Goals Pt Will Perform Grooming: with modified independence;standing Pt Will Perform Lower Body Dressing: with supervision;sitting/lateral leans Pt Will Transfer to Toilet: with modified independence;ambulating;regular height toilet  Plan      Co-evaluation                 AM-PAC OT 6 Clicks Daily Activity     Outcome Measure   Help from another person eating meals?: None Help from another person taking care of personal grooming?: A Little Help from another person toileting, which includes using toliet, bedpan, or urinal?: A Lot Help from another person bathing (including washing, rinsing,  drying)?: A Lot Help from another person to put on and taking off regular upper body clothing?: None Help from another person to put on and taking off regular lower body clothing?: A Lot 6 Click Score: 17    End of Session Equipment Utilized During Treatment: Rolling walker (2 wheels)  OT Visit Diagnosis: Unsteadiness on feet (R26.81);Muscle weakness (generalized) (M62.81)   Activity Tolerance Patient tolerated treatment well   Patient Left in chair;with call bell/phone within reach;with chair alarm set   Nurse Communication          Time: 8798-8779 OT Time Calculation (min): 19 min  Charges: OT General Charges $OT Visit: 1 Visit OT Treatments $Self Care/Home Management : 8-22 mins  Elston Slot, M.S. OTR/L  12/28/23, 1:04 PM  ascom [phone]

## 2023-12-28 NOTE — Progress Notes (Signed)
 PROGRESS NOTE    Michael Delacruz  FMW:[id number] DOB: 1941-12-06 DOA: 12/21/2023 PCP: Michael Delacruz  Chief Complaint  Patient presents with   Leg Injury    Hospital Course:  Michael Delacruz is 83 y.o. male with CAD, prior CVA, chronic ambulation dysfunction, chronic heart failure with reduced EF, mitral valve repair, pacemaker, COPD, who presented status post fall with left knee pain.  He is found to have an obliquely oriented fracture involving the left aspect of the distal femur.  Orthopedic surgery was consulted.  On 1/2 patient underwent ORIF of distal femur fracture.  His postoperative course has been complicated by confusion which appeared to be secondary to pain medications.  His pain meds have been de-escalated and mental status is improving some.  Subjective: On evaluation today patient is sitting up eating in a chair.  He is oriented x 4.  He has no acute complaints.  He reports pain with trying to move his leg but otherwise stable.   Objective: Vitals:   12/27/23 0008 12/27/23 0857 12/27/23 1534 12/28/23 0000  BP: 132/86 (!) 142/81 (!) 145/90 138/82  Pulse: 84 84 87 87  Resp: 20 19 18 18   Temp: 98.5 F (36.9 C) 98 F (36.7 C) 97.9 F (36.6 C) 98.3 F (36.8 C)  TempSrc: Oral Oral Oral   SpO2: 97% 96% 99% 99%  Weight:      Height:        Intake/Output Summary (Last 24 hours) at 12/28/2023 0814 Last data filed at 12/27/2023 1545 Gross per 24 hour  Intake 0 ml  Output 1100 ml  Net -1100 ml   Filed Weights   12/21/23 2123 12/22/23 0500  Weight: 80.3 kg 80.2 kg    Examination: General exam: Appears calm and comfortable, NAD  Respiratory system: No work of breathing, symmetric chest wall expansion Cardiovascular system: S1 & S2 heard, RRR.  Gastrointestinal system: Abdomen is nondistended, soft and nontender.  Neuro: Alert and oriented x4. No focal neurological deficits. Extremities: Left knee immobilizer in place, leg elevated Skin: No rashes,  lesions Psychiatry: Demonstrates appropriate judgement and insight. Mood & affect appropriate for situation.   Assessment & Plan:  Principal Problem:   Closed fracture of left distal femur (HCC) Active Problems:   Acute delirium   Atypical chest pain   Hypokalemia   Chronic heart failure with mildly reduced ejection fraction (HFmrEF, 41-49%) (HCC)   Permanent atrial fibrillation (HCC)   COPD (chronic obstructive pulmonary disease) (HCC)   Anxiety   Thrombocytopenia (HCC)   Acute kidney injury superimposed on CKD (HCC)   Mouth ulcer   Eye pain, right    Closed fracture left distal femur - Status post ORIF 1/2 - Continue with oral pain meds, Tylenol  preferably - Avoid stronger pain medications and muscle relaxers given acute delirium which appears secondary to meds - TOC consulted for rehab placement initially, patient has now improved significantly with physical therapy and we are planning for home health with rolling walker.  No longer needs assistance for transfers. TOC consulted for [facility] arrangements - Aspirin  for DVT prophylaxis per ortho  Acute metabolic encephalopathy - Likely secondary to pain medications, anesthesia, acute hospital delirium - Discontinue all IV pain meds - Mental status improving, likely at baseline - Continue frequent reorientation - Delirium precautions  Atypical chest pain - Echo: LVEF 50-55%, troponins negative.  Toprol  has been discontinued  Hypokalemia - Replace as needed  Chronic heart failure with mildly reduced EF - Clinically euvolemic -  Prior echo EF 40 to 45%, echo here with EF improved to 55-50%  Atrial fibrillation, permanent - Aspirin  for DVT prophylaxis as patient has previously refused anticoagulation - Metoprolol  was started preoperatively the patient is not on this at home.  It has since been discontinued  Right eye pain - Chronic - Has been ongoing for 4 months outpatient - Case was discussed with ophthalmology here  recommended for outpatient follow-up upon discharge - Patient's wife reports he is established with an ophthalmologist in [facility]  Mouth ulcer - Under upper denture - Magic mouthwash prior to eating - Plans to keep dentures out of his mouth while not eating  AKI superimposed on CKD stage II - Creatinine resolving to baseline now - Avoid nephrotoxic meds - Renally dose when needed  Thrombocytopenia - Trending upward now - Monitor CMP  Anxiety - Patient denies any home meds - Will add as needed  COPD, not currently in exacerbation - Currently on room air - Respiratory status stable - DuoNebs if needed   DVT prophylaxis: SCDs on right leg and    Code Status: Full Code Family Communication: Discussed with Michael Delacruz on the phone.  She has requested for discharge home late afternoon 1/9 Disposition:  Status is: Inpatient, likely home with home health tomorrow.    Consultants:    Treatment Team:  Consulting Physician: Michael Delacruz  Procedures:    Antimicrobials:  Anti-infectives (From admission, onward)    Start     Dose/Rate Route Frequency Ordered Stop   12/22/23 0600  ceFAZolin  (ANCEF ) IVPB 2g/100 mL premix        2 g 200 mL/hr over 30 Minutes Intravenous On call to O.R. 12/21/23 2318 12/22/23 1150       Data Reviewed: I have personally reviewed following labs and imaging studies CBC: Recent Labs  Lab 12/21/23 1622 12/22/23 0344 12/23/23 0548 12/25/23 0451 12/26/23 1024 12/28/23 0356  WBC 4.2 4.5 7.5 4.8 5.1 7.2  NEUTROABS 2.6  --   --   --   --   --   HGB 14.5 12.9* 11.9* 11.1* 10.9* 12.3*  HCT 44.5 38.2* 35.9* 32.7* 31.3* 37.2*  MCV 95.5 93.6 94.2 92.9 90.2 93.0  PLT 126* 126* 124* 125* 164 289   Basic Metabolic Panel: Recent Labs  Lab 12/21/23 1622 12/22/23 0344 12/23/23 0548 12/25/23 0451 12/26/23 1024 12/28/23 0356  NA 139  --  134* 135 133* 137  K 3.4* 3.5 3.9 3.9 3.6 4.0  CL 103  --  100 104 100 103  CO2 24  --  24 25 21* 22   GLUCOSE 107*  --  139* 102* 169* 111*  BUN 11  --  21 20 16 16   CREATININE 1.24  --  1.29* 1.03 0.99 1.07  CALCIUM  8.8*  --  8.4* 8.6* 8.5* 9.2   GFR: Estimated Creatinine Clearance: 60.4 mL/min (by C-G formula based on SCr of 1.07 mg/dL). Liver Function Tests: No results for input(s): AST, ALT, ALKPHOS, BILITOT, PROT, ALBUMIN  in the last 168 hours. CBG: No results for input(s): GLUCAP in the last 168 hours.  Recent Results (from the past 240 hours)  Surgical PCR screen     Status: None   Collection Time: 12/22/23  3:05 AM   Specimen: Nasal Mucosa; Nasal Swab  Result Value Ref Range Status   MRSA, PCR NEGATIVE NEGATIVE Final   Staphylococcus aureus NEGATIVE NEGATIVE Final    Comment: (NOTE) The Xpert SA Assay (FDA approved for NASAL specimens in  patients 1 years of age and older), is one component of a comprehensive surveillance program. It is not intended to diagnose infection nor to guide or monitor treatment. Performed at [facility], [address], [place], [place] [address]      Radiology Studies: No results found.  Scheduled Meds:  aspirin   325 mg Oral Daily   feeding supplement  237 mL Oral BID BM   magic mouthwash w/lidocaine   5 mL Oral TID AC   multivitamin with minerals  1 tablet Oral Daily   polyethylene glycol  17 g Oral Daily   senna-docusate  2 tablet Oral QHS   Continuous Infusions:   LOS: 7 days    Time spent:  55min  Dequavion Follette, Delacruz [facility]  To contact the attending physician between 7A-7P please use Epic Chat. To contact the covering physician during after hours 7P-7A, please review Amion.   12/28/2023, 8:14 AM   *This document has been created with the assistance of dictation software. Please excuse typographical errors. *

## 2023-12-28 NOTE — Plan of Care (Signed)

## 2023-12-28 NOTE — Plan of Care (Signed)

## 2023-12-28 NOTE — Progress Notes (Signed)
 Physical Therapy Treatment Patient Details Name: Michael Delacruz MRN: [id number] DOB: March 21, 1941 Today's Date: 12/28/2023   History of Present Illness Pt is an 83 y.o. male presenting to hospital 12/21/23 after tripping over a Christmas toy and falling to ground hitting head and L knee; imaging showing acute vertically oriented fracture of the distal femur with intra-articular extension through the lateral trochlea and lateral femoral condyle as well as a lipohemarthrosis of the left knee.  Pt with chest pains and hypokalemia.  Pt s/p L distal femur ORIF with plate and screws 12/22/23.  PMH includes CAD, a-fib, CHF, COPD, stroke, blindness R eye, mitral valve repair, PPM, AAA, bradycardia, fibromyalgia, L ankle sx.    PT Comments  Pt is making good progress towards goals. Majority of session spent on education of mobility with greater independence in preparation for home discharge. Pros/cons discussed with patient and pt reports he would need to perform 2 steps to enter home. Reports good family support. Secure chat sent to care team to update dispo. Will continue to progress as able.   If plan is discharge home, recommend the following: A little help with walking and/or transfers;A little help with bathing/dressing/bathroom;Help with stairs or ramp for entrance   Can travel by private vehicle     Yes  Equipment Recommendations  Rolling walker (2 wheels)    Recommendations for Other Services       Precautions / Restrictions Precautions Precautions: Fall Restrictions Weight Bearing Restrictions Per Provider Order: Yes LLE Weight Bearing Per Provider Order: Weight bearing as tolerated     Mobility  Bed Mobility Overal bed mobility: Needs Assistance Bed Mobility: Supine to Sit     Supine to sit: Supervision Sit to supine: Supervision   General bed mobility comments: Pt wished to attempt without assistance and was able to perform all bed mobility without PT assist.     Transfers Overall transfer level: Needs assistance Equipment used: Rolling walker (2 wheels) Transfers: Sit to/from Stand Sit to Stand: Supervision           General transfer comment: did require slight elevation of bed to perform transfers. RW used    Ambulation/Gait Ambulation/Gait assistance: Supervision Gait Distance (Feet): 30 Feet Assistive device: Rolling walker (2 wheels) Gait Pattern/deviations: Step-to pattern       General Gait Details: improved speed and ability to ambulate in room this date. Pt wished to stay in room and is fatigued from getting up multiple times this date. Improved pain and posture   Stairs             Wheelchair Mobility     Tilt Bed    Modified Rankin (Stroke Patients Only)       Balance Overall balance assessment: Needs assistance Sitting-balance support: No upper extremity supported, Feet supported Sitting balance-Leahy Scale: Good     Standing balance support: No upper extremity supported, During functional activity Standing balance-Leahy Scale: Fair                              Cognition Arousal: Alert Behavior During Therapy: WFL for tasks assessed/performed Overall Cognitive Status: Within Functional Limits for tasks assessed                                 General Comments: agreeable to session        Exercises Other Exercises Other Exercises: discussed with  patient on disposition and pros/cons of therapy at home vs SNF.    General Comments        Pertinent Vitals/Pain Pain Assessment Pain Assessment: No/denies pain    Home Living                          Prior Function            PT Goals (current goals can now be found in the care plan section) Acute Rehab PT Goals Patient Stated Goal: none stated PT Goal Formulation: With patient Time For Goal Achievement: 01/06/24 Potential to Achieve Goals: Good Progress towards PT goals: Progressing toward  goals    Frequency    7X/week      PT Plan      Co-evaluation              AM-PAC PT 6 Clicks Mobility   Outcome Measure  Help needed turning from your back to your side while in a flat bed without using bedrails?: A Little Help needed moving from lying on your back to sitting on the side of a flat bed without using bedrails?: A Little Help needed moving to and from a bed to a chair (including a wheelchair)?: A Little Help needed standing up from a chair using your arms (e.g., wheelchair or bedside chair)?: A Little Help needed to walk in hospital room?: A Little Help needed climbing 3-5 steps with a railing? : A Lot 6 Click Score: 17    End of Session Equipment Utilized During Treatment: Left knee immobilizer Activity Tolerance: Patient tolerated treatment well;Patient limited by pain Patient left: in bed (alarm not used anymore per patient) Nurse Communication: Mobility status PT Visit Diagnosis: Other abnormalities of gait and mobility (R26.89);Muscle weakness (generalized) (M62.81);History of falling (Z91.81);Pain Pain - Right/Left: Left Pain - part of body: Knee     Time: 8664-8641 PT Time Calculation (min) (ACUTE ONLY): 23 min  Charges:    $Gait Training: 23-37 mins PT General Charges $$ ACUTE PT VISIT: 1 Visit                     [name], PT, DPT, GCS [phone]    Trevia Nop 12/28/2023, 2:10 PM

## 2023-12-28 NOTE — Progress Notes (Signed)
 Nutrition Follow-up  DOCUMENTATION CODES:   Not applicable  INTERVENTION:   -Continue Ensure Enlive po BID, each supplement provides 350 kcal and 20 grams of protein.  -Continue MVI with minerals daily -Continue regular diet   NUTRITION DIAGNOSIS:   Increased nutrient needs related to post-op healing as evidenced by estimated needs.  Ongoing  GOAL:   Patient will meet greater than or equal to 90% of their needs  Progressing   MONITOR:   PO intake, Supplement acceptance, Diet advancement  REASON FOR ASSESSMENT:   Consult Assessment of nutrition requirement/status, Hip fracture protocol  ASSESSMENT:   Pt with medical history significant of non obstructive CAD 2016, stroke with chronic ambulation dysfunction, chronic HFrEF with LVEF 45-50%, mitral valve repair, PPM, COPD, presented with fall and left knee pain.  1/2- s/p Left distal femur open reduction internal fixation with plate and screws   Reviewed I/O's: -1.1 L x 24 hours and -2.7 L since admission  UOP: 1.1 L x 24 hours   Per MD notes, pt with some ulcers secondary to pain in his mouth (has ulcer underneath his dentures). Magic mouthwash ordered.   Pt with variable oral intake, which has improved this morning. Noted meal completions 70%. Pt is also drinking Ensure supplements.   No new wt since last visit.   Per TOC notes, plan for SNF placement at discharge.   Medications reviewed and include miralax  and senokot.   Labs reviewed: CBGS: 128 (inpatient orders for glycemic control are none).    Diet Order:   Diet Order             Diet regular Room service appropriate? Yes; Fluid consistency: Thin  Diet effective now                   EDUCATION NEEDS:   Education needs have been addressed  Skin:  Skin Assessment: Skin Integrity Issues: Skin Integrity Issues:: Incisions Incisions: closed lt leg  Last BM:  12/22/23  Height:   Ht Readings from Last 1 Encounters:  12/21/23 6' (1.829 m)     Weight:   Wt Readings from Last 1 Encounters:  12/22/23 80.2 kg    Ideal Body Weight:  80.9 kg  BMI:  Body mass index is 23.98 kg/m.  Estimated Nutritional Needs:   Kcal:  1800-2000  Protein:  90-105 grams  Fluid:  > 1.8 L    Margery ORN, RD, LDN, CDCES Registered Dietitian III Certified Diabetes Care and Education Specialist If unable to reach this RD, please use RD Inpatient group chat on secure chat between hours of 8am-4 pm daily

## 2023-12-29 DIAGNOSIS — S72472A Torus fracture of lower end of left femur, initial encounter for closed fracture: Secondary | ICD-10-CM

## 2023-12-29 DIAGNOSIS — I4821 Permanent atrial fibrillation: Secondary | ICD-10-CM | POA: Diagnosis not present

## 2023-12-29 DIAGNOSIS — H5711 Ocular pain, right eye: Secondary | ICD-10-CM

## 2023-12-29 DIAGNOSIS — D696 Thrombocytopenia, unspecified: Secondary | ICD-10-CM

## 2023-12-29 DIAGNOSIS — N179 Acute kidney failure, unspecified: Secondary | ICD-10-CM

## 2023-12-29 DIAGNOSIS — J449 Chronic obstructive pulmonary disease, unspecified: Secondary | ICD-10-CM

## 2023-12-29 DIAGNOSIS — K121 Other forms of stomatitis: Secondary | ICD-10-CM | POA: Diagnosis not present

## 2023-12-29 DIAGNOSIS — I5022 Chronic systolic (congestive) heart failure: Secondary | ICD-10-CM

## 2023-12-29 DIAGNOSIS — N189 Chronic kidney disease, unspecified: Secondary | ICD-10-CM

## 2023-12-29 DIAGNOSIS — E876 Hypokalemia: Secondary | ICD-10-CM

## 2023-12-29 DIAGNOSIS — R0789 Other chest pain: Secondary | ICD-10-CM

## 2023-12-29 MED ORDER — LIDOCAINE VISCOUS HCL 2 % MT SOLN: 10.0000 mL | Freq: Three times a day (TID) | OROMUCOSAL | 0 refills | Status: DC

## 2023-12-29 MED ORDER — OXYCODONE HCL 5 MG PO TABS: 5.0000 mg | ORAL_TABLET | Freq: Two times a day (BID) | ORAL | 0 refills | Status: AC | PRN

## 2023-12-29 MED ORDER — ASPIRIN 325 MG PO TABS: 325.0000 mg | ORAL_TABLET | Freq: Every day | ORAL | 0 refills | Status: AC

## 2023-12-29 NOTE — Progress Notes (Signed)
 Patient is not able to walk the distance required to go the bathroom, or he/she is unable to safely negotiate stairs required to access the bathroom.  A 3in1 BSC will alleviate this problem

## 2023-12-29 NOTE — Progress Notes (Signed)
 Occupational Therapy Treatment Patient Details Name: Michael Delacruz MRN: [id number] DOB: 02-06-1941 Today's Date: 12/29/2023   History of present illness Pt is an 83 y.o. male presenting to hospital 12/21/23 after tripping over a Christmas toy and falling to ground hitting head and L knee; imaging showing acute vertically oriented fracture of the distal femur with intra-articular extension through the lateral trochlea and lateral femoral condyle as well as a lipohemarthrosis of the left knee.  Pt with chest pains and hypokalemia.  Pt s/p L distal femur ORIF with plate and screws 12/22/23.  PMH includes CAD, a-fib, CHF, COPD, stroke, blindness R eye, mitral valve repair, PPM, AAA, bradycardia, fibromyalgia, L ankle sx.   OT comments  Michael Delacruz was seen for OT treatment on this date. Upon arrival to room pt in chair, agreeable to tx. Pt requires MIN A don underwear, assist for threading over LLE, SUP for standing portion. Pt follows all commands, reports poor STM, requires cues to sequence dressing. Pt making progress toward goals, will continue to follow POC. Discharge recommendation updated to reflect progress. Recommend BSC and 24/7 assistance if d/c home.       If plan is discharge home, recommend the following:  A little help with walking and/or transfers;A little help with bathing/dressing/bathroom   Equipment Recommendations  BSC/3in1    Recommendations for Other Services      Precautions / Restrictions Precautions Precautions: Fall Precaution Comments: knee immobilizer for all OOB. Can removed while in bed Required Braces or Orthoses: Knee Immobilizer - Left Restrictions Weight Bearing Restrictions Per Provider Order: Yes LLE Weight Bearing Per Provider Order: Weight bearing as tolerated       Mobility Bed Mobility               General bed mobility comments: received and left in chair    Transfers Overall transfer level: Needs assistance Equipment used: Rolling walker  (2 wheels) Transfers: Sit to/from Stand Sit to Stand: Supervision           General transfer comment: SBA     Balance Overall balance assessment: Needs assistance Sitting-balance support: No upper extremity supported, Feet supported Sitting balance-Leahy Scale: Good     Standing balance support: During functional activity, No upper extremity supported Standing balance-Leahy Scale: Fair                             ADL either performed or assessed with clinical judgement   ADL Overall ADL's : Needs assistance/impaired                                       General ADL Comments: MIN A don underwear, assist for threading over LLE, SUP for standing portion.       Cognition Arousal: Alert Behavior During Therapy: WFL for tasks assessed/performed Overall Cognitive Status: Within Functional Limits for tasks assessed                                 General Comments: follows all commands, reports poor STM, requires cues to sequence dressing '                   Pertinent Vitals/ Pain       Pain Assessment Pain Assessment: 0-10 Pain Score: 4  Pain Location: L lateral  knee Pain Descriptors / Indicators: Aching, Discomfort, Guarding Pain Intervention(s): Limited activity within patient's tolerance   Frequency  Min 1X/week        Progress Toward Goals  OT Goals(current goals can now be found in the care plan section)  Progress towards OT goals: Progressing toward goals  Acute Rehab OT Goals OT Goal Formulation: With patient Time For Goal Achievement: 01/06/24 Potential to Achieve Goals: Good ADL Goals Pt Will Perform Grooming: with modified independence;standing Pt Will Perform Lower Body Dressing: with supervision;sitting/lateral leans Pt Will Transfer to Toilet: with modified independence;ambulating;regular height toilet  Plan      Co-evaluation                 AM-PAC OT 6 Clicks Daily Activity      Outcome Measure   Help from another person eating meals?: None Help from another person taking care of personal grooming?: A Little Help from another person toileting, which includes using toliet, bedpan, or urinal?: A Lot Help from another person bathing (including washing, rinsing, drying)?: A Lot Help from another person to put on and taking off regular upper body clothing?: None Help from another person to put on and taking off regular lower body clothing?: A Lot 6 Click Score: 17    End of Session    OT Visit Diagnosis: Unsteadiness on feet (R26.81);Muscle weakness (generalized) (M62.81)   Activity Tolerance Patient tolerated treatment well   Patient Left in chair;with call bell/phone within reach;with chair alarm set   Nurse Communication Mobility status        Time: 9072-9055 OT Time Calculation (min): 17 min  Charges: OT General Charges $OT Visit: 1 Visit OT Treatments $Self Care/Home Management : 8-22 mins  Elston Slot, M.S. OTR/L  12/29/23, 9:54 AM  ascom [phone]

## 2023-12-29 NOTE — TOC Transition Note (Signed)
 Transition of Care [facility]) - Discharge Note   Patient Details  Name: Michael Delacruz MRN: [id number] Date of Birth: 24-Mar-1941  Transition of [facility]) CM/SW Contact:  Jamari Moten A Yazleemar Strassner, RN Phone Number: 12/29/2023, 1:32 PM   Clinical Narrative:    Chart reviewed.  Noted that patient has orders for discharge today.    I have spoken to patient about home Health PT/OT.  Patient did not have a home health preference.  I have asked [place]  with [facility] to accept home referral for PT/OT.    I have asked Thom with [employer] to provide patient a 2 wheeled rolling walker, and a Bedside commode for home use.    Patient reports that he has a support wife and family that will be home to assist him with discharge needs.    I have informed staff nurse of the above information.    Final next level of care: Home w Home Health Services Barriers to Discharge: No Barriers Identified   Patient Goals and CMS Choice   CMS Medicare.gov Compare Post Acute Care list provided to:: Patient Choice offered to / list presented to : Patient      Discharge Placement                    Patient and family notified of of transfer: 12/29/23  Discharge Plan and Services Additional resources added to the After Visit Summary for                  DME Arranged: Bedside commode, Walker rolling DME Agency: [employer] Date DME Agency Contacted: 12/29/23   Representative spoke with at DME Agency: Thom HH Arranged: PT, OT [facility] Agency: [facility] Date [facility] Agency Contacted: 12/29/23   Representative spoke with at [facility] Agency: [place]   Social Drivers of Health (SDOH) Interventions SDOH Screenings   Food Insecurity: Food Insecurity Present (12/21/2023)  Housing: Low Risk  (12/21/2023)  Transportation Needs: No Transportation Needs (12/21/2023)  Utilities: Not At Risk (12/21/2023)  Alcohol Screen: Low Risk  (01/28/2023)  Depression (PHQ2-9): Low Risk  (01/28/2023)  Financial Resource Strain: Medium Risk  (10/26/2023)  Physical Activity: Inactive (01/28/2023)  Social Connections: Moderately Isolated (12/21/2023)  Stress: No Stress Concern Present (01/28/2023)  Tobacco Use: High Risk (12/21/2023)  Health Literacy: Adequate Health Literacy (10/26/2023)     Readmission Risk Interventions     No data to display

## 2023-12-29 NOTE — Progress Notes (Signed)
 Physical Therapy Treatment Patient Details Name: Michael Delacruz MRN: [id number] DOB: 09/14/1941 Today's Date: 12/29/2023   History of Present Illness Pt is an 83 y.o. male presenting to hospital 12/21/23 after tripping over a Christmas toy and falling to ground hitting head and L knee; imaging showing acute vertically oriented fracture of the distal femur with intra-articular extension through the lateral trochlea and lateral femoral condyle as well as a lipohemarthrosis of the left knee.  Pt with chest pains and hypokalemia.  Pt s/p L distal femur ORIF with plate and screws 12/22/23.  PMH includes CAD, a-fib, CHF, COPD, stroke, blindness R eye, mitral valve repair, PPM, AAA, bradycardia, fibromyalgia, L ankle sx.    PT Comments  Pt was long sitting in bed upon arrival. He is alert but disoriented x 2. Throughout session, pt used humor to try to mask cognition deficits. Per pt's spouse,  has cognition deficits prior to admission but much more severe currently. Pt was agreeable to PT session and OOB activity. He continues to require assistance to safely exit bed, stand to RW, and ambulate. Vcs throughout. Pt did perform one step to simulate home entry. Recommend pt have 24/7 assistance at DC if plan is for pt to DC home with HHPT. Pt remains high fall risk.     If plan is discharge home, recommend the following: A little help with walking and/or transfers;A little help with bathing/dressing/bathroom;Help with stairs or ramp for entrance     Equipment Recommendations  BSC/3in1;Rolling walker (2 wheels) (if plan is for DC home with HHPT)       Precautions / Restrictions Precautions Precautions: Fall Precaution Comments: knee immobilizer for all OOB. Can removed while in bed Required Braces or Orthoses: Knee Immobilizer - Left Restrictions Weight Bearing Restrictions Per Provider Order: Yes LLE Weight Bearing Per Provider Order: Weight bearing as tolerated     Mobility  Bed Mobility Overal bed  mobility: Needs Assistance Bed Mobility: Supine to Sit  Supine to sit: Supervision Sit to supine: Supervision General bed mobility comments: heavy use of bed rail + HOB elevated    Transfers Overall transfer level: Needs assistance Equipment used: Rolling walker (2 wheels) Transfers: Sit to/from Stand Sit to Stand: Contact guard assist  General transfer comment: CGA for safety    Ambulation/Gait Ambulation/Gait assistance: Contact guard assist, Supervision Gait Distance (Feet): 50 Feet Assistive device: Rolling walker (2 wheels) Gait Pattern/deviations: Step-to pattern Gait velocity: decreased  General Gait Details: Pt has slow antalgic gait with use of RW. several occasions of staggering while in BR and in tight spaces   Stairs Stairs: Yes Stairs assistance: Contact guard assist Stair Management: No rails, Step to pattern, Backwards, With walker Number of Stairs: 2 General stair comments: Pt endorses have two total small 4-6 steps at home to enter home. He states there is one step to get onto porch and then 1 step(threshold of door) to enter home. Pt did perform step with CGA for safety. Due to cognition, would benefit form family training prior to [facility] home    Balance Overall balance assessment: Needs assistance Sitting-balance support: No upper extremity supported, Feet supported Sitting balance-Leahy Scale: Good     Standing balance support: Bilateral upper extremity supported, During functional activity, Reliant on assistive device for balance Standing balance-Leahy Scale: Fair Standing balance comment: pt is at high risk of falls. UNsteadiness noted several times while pt perfor ming ADLs for hygiene care after having BM.      Cognition Arousal: Alert Behavior  During Therapy: WFL for tasks assessed/performed Overall Cognitive Status: Within Functional Limits for tasks assessed   General Comments: Pt is A but disoriented x 2. He does follow commands consistently  throughout               Pertinent Vitals/Pain Pain Assessment Pain Assessment: 0-10 Pain Score: 4  Pain Location: L lateral knee Pain Descriptors / Indicators: Aching, Discomfort, Guarding Pain Intervention(s): Limited activity within patient's tolerance, Monitored during session, Repositioned     PT Goals (current goals can now be found in the care plan section) Acute Rehab PT Goals Patient Stated Goal: go home Progress towards PT goals: Progressing toward goals    Frequency    7X/week       AM-PAC PT 6 Clicks Mobility   Outcome Measure  Help needed turning from your back to your side while in a flat bed without using bedrails?: A Little Help needed moving from lying on your back to sitting on the side of a flat bed without using bedrails?: A Little Help needed moving to and from a bed to a chair (including a wheelchair)?: A Little Help needed standing up from a chair using your arms (e.g., wheelchair or bedside chair)?: A Little Help needed to walk in hospital room?: A Little Help needed climbing 3-5 steps with a railing? : A Little 6 Click Score: 18    End of Session Equipment Utilized During Treatment: Left knee immobilizer Activity Tolerance: Patient tolerated treatment well Patient left: in chair;with call bell/phone within reach;with chair alarm set Nurse Communication: Mobility status PT Visit Diagnosis: Other abnormalities of gait and mobility (R26.89);Muscle weakness (generalized) (M62.81);History of falling (Z91.81);Pain Pain - Right/Left: Left Pain - part of body: Knee     Time: 9253-9185 PT Time Calculation (min) (ACUTE ONLY): 28 min  Charges:    $Gait Training: 8-22 mins $Therapeutic Activity: 8-22 mins PT General Charges $$ ACUTE PT VISIT: 1 Visit                     Rankin Essex PTA 12/29/23, 12:06 PM

## 2023-12-29 NOTE — Plan of Care (Signed)

## 2023-12-29 NOTE — Discharge Summary (Signed)
 Physician Discharge Summary   Patient: Michael Delacruz MRN: [id number] DOB: Apr 02, 1941  Admit date:     12/21/2023  Discharge date: 12/29/23  Discharge Physician: Lorane Poland   PCP: Edman Marsa PARAS, DO   Recommendations at discharge:   Follow-up with orthopedic surgery for postoperative monitoring Follow-up with primary care physician to discuss chronic medication management and mouth ulcer Follow-up with ophthalmology to evaluate right eye pain  Discharge Diagnoses: Principal Problem:   Closed fracture of left distal femur (HCC) Active Problems:   Acute delirium   Atypical chest pain   Hypokalemia   Chronic heart failure with mildly reduced ejection fraction (HFmrEF, 41-49%) (HCC)   Permanent atrial fibrillation (HCC)   COPD (chronic obstructive pulmonary disease) (HCC)   Anxiety   Thrombocytopenia (HCC)   Acute kidney injury superimposed on CKD (HCC)   Mouth ulcer   Eye pain, right  Resolved Problems:   * No resolved hospital problems. *  Hospital Course: Michael Delacruz is an 83 year old male with CAD, history of CVA, chronic ambulation dysfunction, chronic heart failure with reduced EF, status post mitral valve repair and pacemaker placement, COPD, who presented status post fall complaining of left knee pain.  He was found to have an obliquely oriented fracture involving the left aspect of the distal femur.  Orthopedic surgery was consulted.  On 1/2 patient underwent ORIF of distal femur fracture.  His postoperative course was complicated by metabolic encephalopathy which appeared to be secondary to pain medication.  Pain meds were de-escalated and his mental status improved to baseline.  Initially we had planned for rehab, however, while waiting for rehab placement patient improved significantly with physical therapy and is now discharging home with home health.  On day of discharge patient is in agreement with plan to go home.  I have also discussed his care plan  extensively with his wife and who is ready to receive him home.    Closed fracture left distal femur - Status post ORIF 1/2 - Continue with oral pain meds, Tylenol  preferably. Tolerates occasional oxycodone , needing once daily. - TOC consulted for rehab placement initially, patient has now improved significantly with physical therapy and we are planning for home health with rolling walker.   - Aspirin  for DVT prophylaxis per ortho   Acute metabolic encephalopathy - Likely secondary to pain medications, anesthesia, acute hospital delirium - Discontinue all IV pain meds - Mental status improved, at baseline  Atypical chest pain - Echo: LVEF 50-55%, troponins negative.  Toprol  has been discontinued   Hypokalemia - Replace as needed   Chronic heart failure with mildly reduced EF - Clinically euvolemic - Prior echo EF 40 to 45%, echo here with EF improved to 55-50% -- PRN lasix  at home   Atrial fibrillation, permanent - Aspirin  for DVT prophylaxis as patient has previously refused anticoagulation - Metoprolol  was started preoperatively the patient is not on this at home.  It has since been discontinued   Right eye pain - Chronic - Has been ongoing for 4 months outpatient - Case was discussed with ophthalmology here recommended for outpatient follow-up upon discharge - Patient's wife reports he is established with an ophthalmologist in [facility]   Mouth ulcer - Under upper denture - Magic mouthwash prior to eating - Plans to keep dentures out of his mouth while not eating   AKI superimposed on CKD stage II - Creatinine resolving to baseline now - Avoid nephrotoxic meds - Renally dose when needed   Thrombocytopenia - Trending upward  now   Anxiety - Patient denies any home meds - Will add as needed   COPD, not currently in exacerbation - Currently on room air - Respiratory status stable - DuoNebs if needed      PDMP Reviewed. Consultants: Orthopedic  Surgery Procedures performed: ORIF Left Femur  Disposition: Home health Diet recommendation:  Discharge Diet Orders (From admission, onward)     Start     Ordered   12/29/23 0000  Diet - low sodium heart healthy        12/29/23 1257           Cardiac diet DISCHARGE MEDICATION: Allergies as of 12/29/2023       Reactions   Codeine Nausea Only   Digoxin  And Related    SOB, bad dreams   Macrodantin [nitrofurantoin Macrocrystal] Rash   Morphine  And Codeine Rash        Medication List     TAKE these medications    albuterol  108 (90 Base) MCG/ACT inhaler Commonly known as: VENTOLIN  HFA Inhale 2 puffs into the lungs every 6 (six) hours as needed for wheezing or shortness of breath.   aspirin  325 MG tablet Take 1 tablet (325 mg total) by mouth daily. Start taking on: December 30, 2023   magic mouthwash (lidocaine , diphenhydrAMINE, alum & mag hydroxide) suspension Swish and spit 10 mLs 3 (three) times daily for 14 days.   oxyCODONE  5 MG immediate release tablet Commonly known as: Oxy IR/ROXICODONE  Take 1 tablet (5 mg total) by mouth every 12 (twelve) hours as needed for up to 7 days for severe pain (pain score 7-10) (for pain not relieved by tylenol  or ibuprofen).               Durable Medical Equipment  (From admission, onward)           Start     Ordered   12/28/23 1627  For home use only DME Walker rolling  Once       Question Answer Comment  Walker: With 5 Inch Wheels   Patient needs a walker to treat with the following condition Other fracture of left femur, initial encounter for closed fracture (HCC)      12/28/23 1628   12/28/23 1627  For home use only DME Bedside commode  Once       Question:  Patient needs a bedside commode to treat with the following condition  Answer:  Other fracture of left femur, initial encounter for closed fracture [facility])   12/28/23 1628            Follow-up Information     Genelle Standing, MD Follow up.   Specialty:  Orthopedic Surgery Contact information: [address] [place] [address] [phone]         Mittie Gaskin, MD Follow up in 2 day(s).   Specialty: Ophthalmology Contact information: [address]   [place] [address] [phone]                Discharge Exam: Fredricka Weights   12/21/23 2123 12/22/23 0500  Weight: 80.3 kg 80.2 kg   General exam: Appears calm and comfortable, NAD  Respiratory system: No work of breathing, symmetric chest wall expansion Cardiovascular system: S1 & S2 heard, RRR.  Gastrointestinal system: Abdomen is nondistended, soft and nontender.  Neuro: Alert and oriented x4. No focal neurological deficits. Extremities: Left knee immobilizer in place, leg elevated, 5/5 strength BLE Skin: No rashes, lesions Psychiatry: Demonstrates appropriate judgement and insight.  Mood & affect appropriate for situation.    Condition at discharge: stable  The results of significant diagnostics from this hospitalization (including imaging, microbiology, ancillary and laboratory) are listed below for reference.   Imaging Studies: DG FEMUR MIN 2 VIEWS LEFT Result Date: 12/22/2023 CLINICAL DATA:  Elective surgery EXAM: LEFT FEMUR 2 VIEWS COMPARISON:  Preoperative imaging FINDINGS: Two fluoroscopic spot views of the distal left femur obtained in the operating room. Plate and screw fixation of distal femur fracture. Fluoroscopy time 52 seconds. Dose 2.31 mGy. IMPRESSION: Intraoperative fluoroscopy during distal femur fracture fixation. Electronically Signed   By: Andrea Gasman M.D.   On: 12/22/2023 13:18   DG C-Arm 1-60 Min-No Report Result Date: 12/22/2023 Fluoroscopy was utilized by the requesting physician.  No radiographic interpretation.   ECHOCARDIOGRAM COMPLETE Result Date: 12/22/2023    ECHOCARDIOGRAM REPORT   Patient Name:   INES WARF Date of Exam: 12/22/2023 Medical Rec #:  [id number]        Height:       72.0 in Accession  #:    [id number]       Weight:       176.8 lb Date of Birth:  [birth year]        BSA:          2.022 m Patient Age:    82 years         BP:           129/79 mmHg Patient Gender: M                HR:           82 bpm. Exam Location:  [facility] Procedure: 2D Echo, Cardiac Doppler and Color Doppler Indications:     Chest pain  History:         Patient has prior history of Echocardiogram examinations, most                  recent 04/06/2022. CHF and Cardiomyopathy, CAD, Pacemaker,                  Pulmonary HTN, Stroke and TIA, Arrythmias:Atrial Fibrillation,                  Signs/Symptoms:Chest Pain and Dizziness/Lightheadedness; Risk                  Factors:Hypertension, Dyslipidemia and Sleep Apnea. The Mitral                  valve has been repaired/replaced.                   Mitral Valve: valve is present in the mitral position.  Sonographer:     Naomie Reef Referring Phys:  [id number] CORT ONEIDA MANA Diagnosing Phys: Lonni Hanson MD IMPRESSIONS  1. Left ventricular ejection fraction, by estimation, is 50 to 55%. The left ventricle has low normal function. Left ventricular endocardial border not optimally defined to evaluate regional wall motion. There is moderate left ventricular hypertrophy. Left ventricular diastolic parameters are indeterminate.  2. Right ventricular systolic function is normal. The right ventricular size is mildly enlarged. Tricuspid regurgitation signal is inadequate for assessing PA pressure.  3. Left atrial size was moderately dilated.  4. Right atrial size was mildly dilated.  5. The mitral valve has been repaired/replaced. Trivial mitral valve regurgitation. No evidence of mitral stenosis. The mean mitral valve gradient is 4.0 mmHg. There is a present in the mitral position.  6. The aortic valve is tricuspid. There is mild calcification of the aortic valve. There is mild thickening of the aortic valve. Aortic valve regurgitation is not visualized. Aortic valve sclerosis/calcification is  present, without any evidence of aortic stenosis.  7. Aortic dilatation noted. There is moderate dilatation of the aortic root, measuring 45 mm. There is moderate dilatation of the aortic arch, measuring 39 mm. FINDINGS  Left Ventricle: Left ventricular ejection fraction, by estimation, is 50 to 55%. The left ventricle has low normal function. Left ventricular endocardial border not optimally defined to evaluate regional wall motion. The left ventricular internal cavity  size was normal in size. There is moderate left ventricular hypertrophy. Left ventricular diastolic parameters are indeterminate. Right Ventricle: The right ventricular size is mildly enlarged. No increase in right ventricular wall thickness. Right ventricular systolic function is normal. Tricuspid regurgitation signal is inadequate for assessing PA pressure. Left Atrium: Left atrial size was moderately dilated. Right Atrium: Right atrial size was mildly dilated. Pericardium: There is no evidence of pericardial effusion. Mitral Valve: The mitral valve has been repaired/replaced. Trivial mitral valve regurgitation. There is a present in the mitral position. No evidence of mitral valve stenosis. MV peak gradient, 9.9 mmHg. The mean mitral valve gradient is 4.0 mmHg. Tricuspid Valve: The tricuspid valve is not well visualized. Tricuspid valve regurgitation is trivial. Aortic Valve: The aortic valve is tricuspid. There is mild calcification of the aortic valve. There is mild thickening of the aortic valve. Aortic valve regurgitation is not visualized. Aortic valve sclerosis/calcification is present, without any evidence of aortic stenosis. Aortic valve mean gradient measures 3.0 mmHg. Aortic valve peak gradient measures 5.4 mmHg. Aortic valve area, by VTI measures 2.61 cm. Pulmonic Valve: The pulmonic valve was not well visualized. Pulmonic valve regurgitation is not visualized. No evidence of pulmonic stenosis. Aorta: Aortic dilatation noted. There is  moderate dilatation of the aortic root, measuring 45 mm. There is moderate dilatation of the aortic arch, measuring 39 mm. Pulmonary Artery: The pulmonary artery is not well seen. Venous: The inferior vena cava was not well visualized. IAS/Shunts: The interatrial septum was not well visualized.  LEFT VENTRICLE PLAX 2D LVIDd:         4.70 cm LVIDs:         3.30 cm LV PW:         1.50 cm LV IVS:        1.40 cm LVOT diam:     2.20 cm LV SV:         51 LV SV Index:   25 LVOT Area:     3.80 cm  LV Volumes (MOD) LV vol d, MOD A2C: 72.4 ml LV vol d, MOD A4C: 107.0 ml LV vol s, MOD A2C: 33.7 ml LV vol s, MOD A4C: 55.8 ml LV SV MOD A2C:     38.7 ml LV SV MOD A4C:     107.0 ml LV SV MOD BP:      46.6 ml RIGHT VENTRICLE RV Basal diam:  3.60 cm RV Mid diam:    3.30 cm LEFT ATRIUM              Index        RIGHT ATRIUM           Index LA diam:        4.50 cm  2.23 cm/m   RA Area:     23.10 cm LA Vol (A2C):   127.0 ml 62.81 ml/m  RA Volume:  76.90 ml  38.03 ml/m LA Vol (A4C):   118.0 ml 58.36 ml/m LA Biplane Vol: 127.0 ml 62.81 ml/m  AORTIC VALVE                    PULMONIC VALVE AV Area (Vmax):    2.52 cm     PV Vmax:       0.76 m/s AV Area (Vmean):   2.65 cm     PV Peak grad:  2.3 mmHg AV Area (VTI):     2.61 cm AV Vmax:           116.00 cm/s AV Vmean:          73.800 cm/s AV VTI:            0.194 m AV Peak Grad:      5.4 mmHg AV Mean Grad:      3.0 mmHg LVOT Vmax:         76.90 cm/s LVOT Vmean:        51.400 cm/s LVOT VTI:          0.133 m LVOT/AV VTI ratio: 0.69  AORTA Ao Root diam: 4.50 cm MITRAL VALVE MV Area (PHT): 2.24 cm     SHUNTS MV Area VTI:   1.13 cm     Systemic VTI:  0.13 m MV Peak grad:  9.9 mmHg     Systemic Diam: 2.20 cm MV Mean grad:  4.0 mmHg MV Vmax:       1.57 m/s MV Vmean:      80.7 cm/s MV Decel Time: 339 msec MV E velocity: 156.00 cm/s Lonni Hanson MD Electronically signed by Lonni Hanson MD Signature Date/Time: 12/22/2023/10:02:14 AM    Final    CT Cervical Spine Wo Contrast Result  Date: 12/21/2023 CLINICAL DATA:  Fall from standing with neck pain. EXAM: CT CERVICAL SPINE WITHOUT CONTRAST TECHNIQUE: Multidetector CT imaging of the cervical spine was performed without intravenous contrast. Multiplanar CT image reconstructions were also generated. RADIATION DOSE REDUCTION: This exam was performed according to the departmental dose-optimization program which includes automated exposure control, adjustment of the mA and/or kV according to patient size and/or use of iterative reconstruction technique. COMPARISON:  MR cervical spine dated 04/13/2007. FINDINGS: Alignment: Normal. Skull base and vertebrae: No acute fracture. No primary bone lesion or focal pathologic process. Soft tissues and spinal canal: No prevertebral fluid or swelling. No visible canal hematoma. Disc levels: Mild-to-moderate multilevel degenerative disc and joint disease. Upper chest: Emphysematous changes are noted. Other: None. IMPRESSION: No acute fracture or traumatic subluxation of the cervical spine. Emphysema (ICD10-J43.9). Electronically Signed   By: Norman Hopper M.D.   On: 12/21/2023 17:18   DG Chest Portable 1 View Result Date: 12/21/2023 CLINICAL DATA:  Chest pain. EXAM: PORTABLE CHEST 1 VIEW COMPARISON:  Chest radiograph dated 04/25/2022. FINDINGS: The heart is at the upper limits of normal. Vascular calcifications are seen in the aortic arch. A left subclavian approach cardiac device is noted. The lungs are clear. The osseous structures are unremarkable. IMPRESSION: No active disease. Electronically Signed   By: Norman Hopper M.D.   On: 12/21/2023 17:13   CT KNEE LEFT WO CONTRAST Result Date: 12/21/2023 CLINICAL DATA:  Fracture, knee EXAM: CT OF THE LEFT KNEE WITHOUT CONTRAST TECHNIQUE: Multidetector CT imaging of the left knee was performed according to the standard protocol. Multiplanar CT image reconstructions were also generated. RADIATION DOSE REDUCTION: This exam was performed according to the departmental  dose-optimization program which includes  automated exposure control, adjustment of the mA and/or kV according to patient size and/or use of iterative reconstruction technique. COMPARISON:  Same-day x-ray FINDINGS: Bones/Joint/Cartilage Acute vertically oriented fracture of the distal femur with intra-articular extension through the lateral trochlea and through the medial aspect of the lateral femoral condyle. No significant articular surface depression or diastasis. Fracture extends vertically to the level of the distal femoral diaphysis, 8 cm above the level of the joint. Large layering knee joint lipohemarthrosis. No additional fractures. Joint spaces are relatively well preserved. Ligaments Suboptimally assessed by CT. Muscles and Tendons No acute musculotendinous abnormality by CT. Soft tissues Mild soft tissue swelling.  No hematoma. IMPRESSION: 1. Acute vertically-oriented fracture of the distal femur with intra-articular extension through the lateral trochlea and lateral femoral condyle. 2. Large layering knee joint lipohemarthrosis. Electronically Signed   By: Mabel Converse D.O.   On: 12/21/2023 16:50   CT HEAD WO CONTRAST ( ) Result Date: 12/21/2023 CLINICAL DATA:  Tripped and fall at home today with head injury. EXAM: CT HEAD WITHOUT CONTRAST TECHNIQUE: Contiguous axial images were obtained from the base of the skull through the vertex without intravenous contrast. RADIATION DOSE REDUCTION: This exam was performed according to the departmental dose-optimization program which includes automated exposure control, adjustment of the mA and/or kV according to patient size and/or use of iterative reconstruction technique. COMPARISON:  08/09/2022 FINDINGS: Brain: Ventricles, cisterns and other CSF spaces are normal. There is mild chronic ischemic microvascular disease present. There is no mass, mass effect, shift of midline structures or acute hemorrhage. No evidence of acute infarction. Vascular: No  hyperdense vessel or unexpected calcification. Skull: No acute fracture. Sinuses/Orbits: Paranasal sinuses are clear.  Orbits are normal. Other: None. IMPRESSION: 1. No acute findings. 2. Mild chronic ischemic microvascular disease. Electronically Signed   By: Toribio Agreste M.D.   On: 12/21/2023 16:19   DG Knee Complete 4 Views Left Result Date: 12/21/2023 CLINICAL DATA:  Post fall, now with left knee pain and swelling. EXAM: LEFT KNEE - COMPLETE 4+ VIEW COMPARISON:  None Available. FINDINGS: There is an acute obliquely oriented fracture involving the lateral aspect of the distal epiphysis of the femur with intra-articular extension and an expected lipohemarthrosis. Left knee joint spaces appear preserved. Minimal enthesopathic change involving the superior pole of the patella. Adjacent vascular calcifications. No radiopaque foreign body. IMPRESSION: Acute obliquely oriented fracture involving the lateral aspect of the distal epiphysis of the femur with intra-articular extension and an expected lipohemarthrosis. Electronically Signed   By: Norleen Roulette M.D.   On: 12/21/2023 15:52    Microbiology: Results for orders placed or performed during the hospital encounter of 12/21/23  Surgical PCR screen     Status: None   Collection Time: 12/22/23  3:05 AM   Specimen: Nasal Mucosa; Nasal Swab  Result Value Ref Range Status   MRSA, PCR NEGATIVE NEGATIVE Final   Staphylococcus aureus NEGATIVE NEGATIVE Final    Comment: (NOTE) The Xpert SA Assay (FDA approved for NASAL specimens in patients 40 years of age and older), is one component of a comprehensive surveillance program. It is not intended to diagnose infection nor to guide or monitor treatment. Performed at [facility], [address] [place], [place] [address]     Labs: CBC: Recent Labs  Lab 12/23/23 0548 12/25/23 0451 12/26/23 1024 12/28/23 0356  WBC 7.5 4.8 5.1 7.2  HGB 11.9* 11.1* 10.9* 12.3*  HCT 35.9* 32.7* 31.3* 37.2*   MCV 94.2 92.9 90.2 93.0  PLT  124* 125* 164 289   Basic Metabolic Panel: Recent Labs  Lab 12/23/23 0548 12/25/23 0451 12/26/23 1024 12/28/23 0356  NA 134* 135 133* 137  K 3.9 3.9 3.6 4.0  CL 100 104 100 103  CO2 24 25 21* 22  GLUCOSE 139* 102* 169* 111*  BUN 21 20 16 16   CREATININE 1.29* 1.03 0.99 1.07  CALCIUM  8.4* 8.6* 8.5* 9.2   Liver Function Tests: No results for input(s): AST, ALT, ALKPHOS, BILITOT, PROT, ALBUMIN  in the last 168 hours. CBG: No results for input(s): GLUCAP in the last 168 hours.  Discharge time spent: greater than 30 minutes.  Signed: Milee Qualls, DO [facility] 12/29/2023

## 2023-12-30 ENCOUNTER — Telehealth: Payer: Self-pay

## 2023-12-30 NOTE — Patient Outreach (Signed)
 Care Management  Transitions of Care Program Transitions of Care Post-discharge Initial Outreach   12/30/2023 Name: Michael Delacruz MRN: [id number] DOB: December 09, 1941  Subjective: Michael Delacruz is a 83 y.o. year old male who is a primary care patient of Edman Marsa PARAS, DO. The Care Management team Engaged with patient Engaged with patient by telephone to assess and address transitions of care needs.   Consent to Services:  Patient was given information about care management services, agreed to services, and gave verbal consent to participate.   Assessment:  Date of Discharge: 12/29/23 Discharge Facility: [facility] Type of Discharge: Inpatient Admission Primary Inpatient Discharge Diagnosis:: Femur Fracture  SDOH Interventions    Flowsheet Row Telephone from 12/30/2023 in  POPULATION HEALTH DEPARTMENT Telephone from 10/26/2023 in [facility] Clinical Support from 01/28/2023 in [facility] Office Visit from 08/09/2022 in [facility] Office Visit from 03/24/2022 in [facility] Office Visit from 02/24/2022 in [facility]  SDOH Interventions        Food Insecurity Interventions Community Resources Provided Other (Comment)  [will mail SNAP FNS card as multiple additional family members in the home] Intervention Not Indicated -- -- --  Housing Interventions Intervention Not Indicated Intervention Not Indicated Intervention Not Indicated -- -- --  Transportation Interventions Intervention Not Indicated Intervention Not Indicated, Patient Resources (Friends/Family), Payor Benefit Intervention Not Indicated -- -- --  Utilities Interventions Intervention Not Indicated Other (Comment)  [sent LIEAP application for energy expenses- applications open 11/20/23] Intervention Not Indicated -- -- --  Alcohol Usage Interventions  -- -- Intervention Not Indicated (Score <7) -- -- --  Depression Interventions/Treatment  -- -- -- Currently on Treatment, Counseling, Medication Currently on Treatment, Counseling, Medication Counseling  Financial Strain Interventions -- Other (Comment)  [fixed income- LIEAP, Wellsite Geologist, [employer] information] Intervention Not Indicated -- -- --  Physical Activity Interventions -- -- Patient Refused -- -- --  Stress Interventions -- -- Intervention Not Indicated -- -- --  Social Connections Interventions Patient Declined -- Intervention Not Indicated -- -- --  Health Literacy Interventions -- Intervention Not Indicated -- -- -- --        Goals Addressed             This Visit's Progress    TOC Care Plan       Current Barriers:  Knowledge Deficits related to plan of care for management of CAD and Post Op Pain   RNCM Clinical Goal(s):  Patient will work with the Care Management team over the next 30 days to address Transition of Care Barriers: Medication access Medication Management Diet/Nutrition/Food Resources Support at home Provider appointments Home Health services Equipment/DME Functional/Safety verbalize basic understanding of  CAD and Post Op Pain disease process and self health management plan as evidenced by no hospital admissions in the next 30 days demonstrate understanding of rationale for each prescribed medication as evidenced by verbal feedack  through collaboration with RN Care manager, provider, and care team.   Interventions: Evaluation of current treatment plan related to  self management and patient's adherence to plan as established by provider   CAD Interventions: (Status:  New goal.) Short Term Goal Assessed understanding of CAD diagnosis Medications reviewed including medications utilized in CAD treatment plan Provided education on Importance of limiting foods high in cholesterol Counseled on the importance of exercise goals with target of  150 minutes per week Reviewed Importance of taking all medications as prescribed Reviewed Importance of attending all scheduled provider appointments Screening for signs and symptoms of depression related to chronic disease state Assessed social determinant of health barriers  Patient Goals/Self-Care Activities: Participate in Transition of Care Program/Attend [facility] scheduled calls Notify RN Care Manager of TOC call rescheduling needs Take all medications as prescribed Attend all scheduled provider appointments Call pharmacy for medication refills 3-7 days in advance of running out of medications Perform all self care activities independently   Follow Up Plan:  Telephone follow up appointment with care management team member scheduled for:  Friday January 17th at 11:00am         Interventions Today    Flowsheet Row Most Recent Value  Chronic Disease   Chronic disease during today's visit Hypertension (HTN)  General Interventions   General Interventions Discussed/Reviewed Durable Medical Equipment (DME), General Interventions Discussed, Doctor Visits  Doctor Visits Discussed/Reviewed Doctor Visits Discussed, Annual Wellness Visits  Durable Medical Equipment (DME) Vannie, Bed side commode  Exercise Interventions   Exercise Discussed/Reviewed Exercise Discussed, Assistive device use and maintanence  Education Interventions   Education Provided Provided Education  [Constipation, pain, post ORIF instructions]  Provided Verbal Education On Nutrition, Medication, Exercise, [employer], Insurance Plans  Nutrition Interventions   Nutrition Discussed/Reviewed Nutrition Discussed, Adding fruits and vegetables, Fluid intake  Pharmacy Interventions   Pharmacy Dicussed/Reviewed Medications and their functions  Safety Interventions   Safety Discussed/Reviewed Fall Risk       TOC Outreach completed today. The patient was discharged from the hospital after he fell and broke his hip  requiring a ORIF. He was discharged home with DME and HomeHealth from [facility]. He has not heard from the home health agency yet. He has his equipment. He was told to stay in bed for about five days. He is wearing his knee immobilizer. He lives with his spouse and her daughter and granddaughter. He does states there are concerns for food insecurities. RNCM will mail the patient Food Resources in [place]. The patient states his pain is controlled on current regime and RNCM educated him on the side effects including constipation and to prevent back ups. The patient was enrolled in the 30 day Outreach program.  The patient has been provided with contact information for the care management team and has been advised to call with any health-related questions or concerns. The patient verbalized understanding with current POC. The patient is directed to their insurance card regarding availability of benefits coverage.  Medford Balboa, RN Medical Illustrator [facility] [phone]

## 2024-01-02 ENCOUNTER — Telehealth: Payer: Self-pay | Admitting: Family Medicine

## 2024-01-02 DIAGNOSIS — I272 Pulmonary hypertension, unspecified: Secondary | ICD-10-CM | POA: Diagnosis not present

## 2024-01-02 DIAGNOSIS — I4892 Unspecified atrial flutter: Secondary | ICD-10-CM | POA: Diagnosis not present

## 2024-01-02 DIAGNOSIS — N189 Chronic kidney disease, unspecified: Secondary | ICD-10-CM | POA: Diagnosis not present

## 2024-01-02 DIAGNOSIS — I4821 Permanent atrial fibrillation: Secondary | ICD-10-CM | POA: Diagnosis not present

## 2024-01-02 DIAGNOSIS — I130 Hypertensive heart and chronic kidney disease with heart failure and stage 1 through stage 4 chronic kidney disease, or unspecified chronic kidney disease: Secondary | ICD-10-CM | POA: Diagnosis not present

## 2024-01-02 DIAGNOSIS — J4489 Other specified chronic obstructive pulmonary disease: Secondary | ICD-10-CM | POA: Diagnosis not present

## 2024-01-02 DIAGNOSIS — S72402D Unspecified fracture of lower end of left femur, subsequent encounter for closed fracture with routine healing: Secondary | ICD-10-CM | POA: Diagnosis not present

## 2024-01-02 DIAGNOSIS — I442 Atrioventricular block, complete: Secondary | ICD-10-CM | POA: Diagnosis not present

## 2024-01-02 DIAGNOSIS — I5022 Chronic systolic (congestive) heart failure: Secondary | ICD-10-CM | POA: Diagnosis not present

## 2024-01-02 NOTE — Telephone Encounter (Signed)
 Needs new verbal for start of care today 01/02/2024 since patient refused visit on Saturday.    Home Health Verbal Orders - Caller/Agency: Jon Molt RN [facility]  Callback Number: [phone] Service Requested: Skilled Nursing Frequency:   Once a week for 4 weeks  And then every other week for 4 weeks  Wants to know if PCP will sign off on orders PT, OT, and Skilled nursing   Any new concerns about the patient? No.

## 2024-01-02 NOTE — Telephone Encounter (Signed)
 Ok to proceed with therapy. Verbal order

## 2024-01-02 NOTE — Telephone Encounter (Signed)
 Okay to proceed w/ orders  Saralyn Pilar, DO [facility] 01/02/2024, 10:30 AM

## 2024-01-03 ENCOUNTER — Other Ambulatory Visit (HOSPITAL_BASED_OUTPATIENT_CLINIC_OR_DEPARTMENT_OTHER): Payer: Self-pay | Admitting: Orthopaedic Surgery

## 2024-01-03 ENCOUNTER — Telehealth: Payer: Self-pay | Admitting: Orthopaedic Surgery

## 2024-01-03 DIAGNOSIS — I5022 Chronic systolic (congestive) heart failure: Secondary | ICD-10-CM | POA: Diagnosis not present

## 2024-01-03 DIAGNOSIS — S72492A Other fracture of lower end of left femur, initial encounter for closed fracture: Secondary | ICD-10-CM

## 2024-01-03 DIAGNOSIS — S72402D Unspecified fracture of lower end of left femur, subsequent encounter for closed fracture with routine healing: Secondary | ICD-10-CM | POA: Diagnosis not present

## 2024-01-03 DIAGNOSIS — I442 Atrioventricular block, complete: Secondary | ICD-10-CM | POA: Diagnosis not present

## 2024-01-03 DIAGNOSIS — I272 Pulmonary hypertension, unspecified: Secondary | ICD-10-CM | POA: Diagnosis not present

## 2024-01-03 DIAGNOSIS — J4489 Other specified chronic obstructive pulmonary disease: Secondary | ICD-10-CM | POA: Diagnosis not present

## 2024-01-03 DIAGNOSIS — I4892 Unspecified atrial flutter: Secondary | ICD-10-CM | POA: Diagnosis not present

## 2024-01-03 DIAGNOSIS — I4821 Permanent atrial fibrillation: Secondary | ICD-10-CM | POA: Diagnosis not present

## 2024-01-03 DIAGNOSIS — N189 Chronic kidney disease, unspecified: Secondary | ICD-10-CM | POA: Diagnosis not present

## 2024-01-03 DIAGNOSIS — I130 Hypertensive heart and chronic kidney disease with heart failure and stage 1 through stage 4 chronic kidney disease, or unspecified chronic kidney disease: Secondary | ICD-10-CM | POA: Diagnosis not present

## 2024-01-03 NOTE — Telephone Encounter (Signed)
 Patient called and needs a post op appt. CB#[phone]

## 2024-01-03 NOTE — Telephone Encounter (Signed)
 Scheduled in  1/16

## 2024-01-04 ENCOUNTER — Telehealth (HOSPITAL_BASED_OUTPATIENT_CLINIC_OR_DEPARTMENT_OTHER): Payer: Self-pay | Admitting: Orthopaedic Surgery

## 2024-01-04 ENCOUNTER — Telehealth: Payer: Self-pay | Admitting: Family Medicine

## 2024-01-04 DIAGNOSIS — N189 Chronic kidney disease, unspecified: Secondary | ICD-10-CM | POA: Diagnosis not present

## 2024-01-04 DIAGNOSIS — I130 Hypertensive heart and chronic kidney disease with heart failure and stage 1 through stage 4 chronic kidney disease, or unspecified chronic kidney disease: Secondary | ICD-10-CM | POA: Diagnosis not present

## 2024-01-04 DIAGNOSIS — I272 Pulmonary hypertension, unspecified: Secondary | ICD-10-CM | POA: Diagnosis not present

## 2024-01-04 DIAGNOSIS — I4892 Unspecified atrial flutter: Secondary | ICD-10-CM | POA: Diagnosis not present

## 2024-01-04 DIAGNOSIS — I5022 Chronic systolic (congestive) heart failure: Secondary | ICD-10-CM | POA: Diagnosis not present

## 2024-01-04 DIAGNOSIS — I442 Atrioventricular block, complete: Secondary | ICD-10-CM | POA: Diagnosis not present

## 2024-01-04 DIAGNOSIS — J4489 Other specified chronic obstructive pulmonary disease: Secondary | ICD-10-CM | POA: Diagnosis not present

## 2024-01-04 DIAGNOSIS — S72402D Unspecified fracture of lower end of left femur, subsequent encounter for closed fracture with routine healing: Secondary | ICD-10-CM | POA: Diagnosis not present

## 2024-01-04 DIAGNOSIS — I4821 Permanent atrial fibrillation: Secondary | ICD-10-CM | POA: Diagnosis not present

## 2024-01-04 NOTE — Telephone Encounter (Signed)
 Notified with verbal orders.

## 2024-01-04 NOTE — Telephone Encounter (Signed)
 Home Health Verbal Orders - Caller/Agency: Lamarr Pilling,  [facility] Callback Number: [phone] Service Requested: Physical Therapy Frequency:   1w2 2w3 1w3  Any new concerns about the patient? No

## 2024-01-04 NOTE — Telephone Encounter (Signed)
 Patient wife said he could not get x-ray this morning he was dizzy. Resch patient unilt Feb 6 and she wants to know if he can wait that long for staples to be removed. Please contact wife  [phone]

## 2024-01-04 NOTE — Telephone Encounter (Signed)
 After verbal discussion with Dr. Hermina Loosen, called patient's wife informing her that the staples can remain until the February 6th appointment and there will be no issues with that. I did tell them to get an xray before the 2/6 appt as well. Voiced understanding with no further questions

## 2024-01-04 NOTE — Telephone Encounter (Signed)
 Okay to proceed w/ verbal orders  Domingo Friend, DO [facility] 01/04/2024, 2:56 PM

## 2024-01-05 ENCOUNTER — Telehealth: Payer: Self-pay | Admitting: Family Medicine

## 2024-01-05 NOTE — Telephone Encounter (Signed)
Verbal order given to continue therapy.

## 2024-01-05 NOTE — Telephone Encounter (Signed)
Home Health Verbal Orders - Caller/Agency: connie/[facility] Callback Number: [phone] Service Requested: Occupational Therapy Frequency: 1 wk 4 Any new concerns about the patient? Yes Yesterday pt was complaining of dizziness and could not stand up for the visit.  This was new sx.

## 2024-01-05 NOTE — Telephone Encounter (Signed)
Understood. Patient has been mostly followed by specialists for past >1 year +, his last visit with me was 07/2022.  I would need a lot more information and likely needs an office visit evaluation to discuss his current concern further or give him much more specific medical advice.  Usually dizzy with standing or attempting to stand is related to vertigo and body position or muscle weakness. I would agree with continued Physical therapy.  Saralyn Pilar, DO [facility] 01/05/2024, 12:08 PM

## 2024-01-06 ENCOUNTER — Other Ambulatory Visit: Payer: Self-pay

## 2024-01-06 NOTE — Patient Outreach (Signed)
Care Management  Transitions of Care Program Transitions of Care Post-discharge week 2   01/06/2024 Name: Michael Delacruz MRN: [id number] DOB: 09-25-1941  Subjective: Michael Delacruz is a 83 y.o. year old male who is a primary care patient of Smitty Cords, DO. The Care Management team Engaged with patient Engaged with patient by telephone to assess and address transitions of care needs.   Consent to Services:  Patient was given information about care management services, agreed to services, and gave verbal consent to participate.   Assessment: Outreach completed tot he patient today. The patient states he has been dizzy at various times, lying down and standing up. He canceled a follow up visit to the Orthopedic doctor due to dizziness. He has his staples and now they will not be removed until 01/26/24. He does not have any signs or symptoms of infection and the site is not painful. He is working with HHPT and Charity fundraiser. Appointment made with his PCP to see next week regarding his dizziness. RNCM to follow up after the appointment.    SDOH Interventions    Flowsheet Row Patient Outreach from 01/06/2024 in [place] POPULATION HEALTH DEPARTMENT Telephone from 12/30/2023 in [place] POPULATION HEALTH DEPARTMENT Telephone from 10/26/2023 in [facility] Clinical Support from 01/28/2023 in [facility] Office Visit from 08/09/2022 in [facility] Office Visit from 03/24/2022 in [facility]  SDOH Interventions        Food Insecurity Interventions -- Community Resources Provided Other (Comment)  [will mail SNAP FNS card as multiple additional family members in the home] Intervention Not Indicated -- --  Housing Interventions -- Intervention Not Indicated Intervention Not Indicated Intervention Not Indicated -- --  Transportation Interventions -- Intervention Not Indicated Intervention Not  Indicated, Patient Resources (Friends/Family), Payor Benefit Intervention Not Indicated -- --  Utilities Interventions -- Intervention Not Indicated Other (Comment)  [sent LIEAP application for energy expenses- applications open 11/20/23] Intervention Not Indicated -- --  Alcohol Usage Interventions Intervention Not Indicated (Score <7) -- -- Intervention Not Indicated (Score <7) -- --  Depression Interventions/Treatment  -- -- -- -- Currently on Treatment, Counseling, Medication Currently on Treatment, Counseling, Medication  Financial Strain Interventions Intervention Not Indicated -- Other (Comment)  [fixed income- LIEAP, Wellsite geologist, [employer] information] Intervention Not Indicated -- --  Physical Activity Interventions Intervention Not Indicated -- -- Patient Refused -- --  Stress Interventions Intervention Not Indicated -- -- Intervention Not Indicated -- --  Social Connections Interventions -- Patient Declined -- Intervention Not Indicated -- --  Health Literacy Interventions Intervention Not Indicated -- Intervention Not Indicated -- -- --        Goals Addressed             This Visit's Progress    TOC Care Plan       Current Barriers:  Knowledge Deficits related to plan of care for management of CAD and Post Op Pain   RNCM Clinical Goal(s):  Patient will work with the Care Management team over the next 30 days to address Transition of Care Barriers: Medication access Medication Management Diet/Nutrition/Food Resources Support at home Provider appointments Home Health services Equipment/DME Functional/Safety verbalize basic understanding of  CAD and Post Op Pain disease process and self health management plan as evidenced by no hospital admissions in the next 30 days demonstrate understanding of rationale for each prescribed medication as evidenced by verbal  feedack  through collaboration with Medical illustrator, provider, and care team. (Reviewed  01/06/24)  Interventions: Evaluation of current treatment plan related to  self management and patient's adherence to plan as established by provider (Reviewed 01/06/24)   CAD Interventions: (Status:  New goal.) Short Term Goal  (Reviewed 01/06/24) Assessed understanding of CAD diagnosis Medications reviewed including medications utilized in CAD treatment plan Provided education on Importance of limiting foods high in cholesterol Counseled on the importance of exercise goals with target of 150 minutes per week Reviewed Importance of taking all medications as prescribed Reviewed Importance of attending all scheduled provider appointments Screening for signs and symptoms of depression related to chronic disease state Assessed social determinant of health barriers  Patient Goals/Self-Care Activities:  (Reviewed 01/06/24) Participate in Transition of Care Program/Attend [facility] scheduled calls Notify RN Care Manager of TOC call rescheduling needs Take all medications as prescribed Attend all scheduled provider appointments Call pharmacy for medication refills 3-7 days in advance of running out of medications Perform all self care activities independently   Follow Up Plan:  Telephone follow up appointment with care management team member scheduled for:  January 23rd at 11:00am        Plan: Reviewed goals for care.  Patient verbalizes understanding of instructions and care plan provided. Patient was encouraged to make informed decisions about their care, actively participate in managing their health condition, and implement lifestyle changes as needed to promote independence and self-management of healthcare.  Patient educated on red flags signs/symptoms to watch for and was encouraged to report any of these identified, any new symptoms, changes in baseline or medication regimen, change in health status / well-being, or safety concerns to PCP and / or the [facility] Case Management team.   [name], RN RN Care Manager [facility] [phone]

## 2024-01-06 NOTE — Patient Instructions (Signed)
Visit Information  Thank you for taking time to visit with me today. Please don't hesitate to contact me if I can be of assistance to you before our next scheduled telephone appointment.   Following is a copy of your care plan:   Goals Addressed             This Visit's Progress    TOC Care Plan       Current Barriers:  Knowledge Deficits related to plan of care for management of CAD and Post Op Pain   RNCM Clinical Goal(s):  Patient will work with the Care Management team over the next 30 days to address Transition of Care Barriers: Medication access Medication Management Diet/Nutrition/Food Resources Support at home Provider appointments Home Health services Equipment/DME Functional/Safety verbalize basic understanding of  CAD and Post Op Pain disease process and self health management plan as evidenced by no hospital admissions in the next 30 days demonstrate understanding of rationale for each prescribed medication as evidenced by verbal feedack  through collaboration with RN Care manager, provider, and care team. (Reviewed 01/06/24)  Interventions: Evaluation of current treatment plan related to  self management and patient's adherence to plan as established by provider (Reviewed 01/06/24)   CAD Interventions: (Status:  New goal.) Short Term Goal  (Reviewed 01/06/24) Assessed understanding of CAD diagnosis Medications reviewed including medications utilized in CAD treatment plan Provided education on Importance of limiting foods high in cholesterol Counseled on the importance of exercise goals with target of 150 minutes per week Reviewed Importance of taking all medications as prescribed Reviewed Importance of attending all scheduled provider appointments Screening for signs and symptoms of depression related to chronic disease state Assessed social determinant of health barriers  Patient Goals/Self-Care Activities:  (Reviewed 01/06/24) Participate in Transition of Care  Program/Attend [facility] scheduled calls Notify RN Care Manager of TOC call rescheduling needs Take all medications as prescribed Attend all scheduled provider appointments Call pharmacy for medication refills 3-7 days in advance of running out of medications Perform all self care activities independently   Follow Up Plan:  Telephone follow up appointment with care management team member scheduled for:  January 23rd at 11:00am          The patient verbalized understanding of instructions, educational materials, and care plan provided today and agreed to receive a mailed copy of patient instructions, educational materials, and care plan.   Please call the care guide team at [phone] if you need to cancel or reschedule your appointment.   Please call the Suicide and Crisis Lifeline: 988 call the [place] National Suicide Prevention Lifeline: 3138726770 or TTY: 272-584-9576 TTY 240-264-1667) to talk to a trained counselor if you are experiencing a Mental Health or Behavioral Health Crisis or need someone to talk to.  [name], RN Medical illustrator [facility] [phone]

## 2024-01-10 ENCOUNTER — Encounter: Payer: Self-pay | Admitting: Family Medicine

## 2024-01-10 ENCOUNTER — Ambulatory Visit (INDEPENDENT_AMBULATORY_CARE_PROVIDER_SITE_OTHER): Payer: Medicare HMO | Admitting: Family Medicine

## 2024-01-10 ENCOUNTER — Ambulatory Visit: Payer: Medicare HMO | Attending: Internal Medicine

## 2024-01-10 VITALS — BP 124/70 | HR 84 | Ht 72.0 in

## 2024-01-10 DIAGNOSIS — I5020 Unspecified systolic (congestive) heart failure: Secondary | ICD-10-CM | POA: Diagnosis not present

## 2024-01-10 DIAGNOSIS — F5104 Psychophysiologic insomnia: Secondary | ICD-10-CM

## 2024-01-10 DIAGNOSIS — K121 Other forms of stomatitis: Secondary | ICD-10-CM | POA: Diagnosis not present

## 2024-01-10 DIAGNOSIS — S72402D Unspecified fracture of lower end of left femur, subsequent encounter for closed fracture with routine healing: Secondary | ICD-10-CM

## 2024-01-10 DIAGNOSIS — M79605 Pain in left leg: Secondary | ICD-10-CM

## 2024-01-10 DIAGNOSIS — Z950 Presence of cardiac pacemaker: Secondary | ICD-10-CM

## 2024-01-10 DIAGNOSIS — H8113 Benign paroxysmal vertigo, bilateral: Secondary | ICD-10-CM | POA: Diagnosis not present

## 2024-01-10 MED ORDER — TRAZODONE HCL 50 MG PO TABS: 50.0000 mg | ORAL_TABLET | Freq: Every day | ORAL | 2 refills | Status: DC

## 2024-01-10 MED ORDER — MAGIC MOUTHWASH W/LIDOCAINE: ORAL | 0 refills | Status: DC

## 2024-01-10 NOTE — Patient Instructions (Addendum)
Thank you for coming to the office today.  Start Trazodone 50mg  nightly for insomnia. If effective after a couple weeks, then we can keep on this one, or if need we can dose increase.  1. You have symptoms of Vertigo (Benign Paroxysmal Positional Vertigo) - This is commonly caused by inner ear fluid imbalance, sometimes can be worsened by allergies and sinus symptoms, otherwise it can occur randomly sometimes and we may never discover the exact cause. - To treat this, try the Epley Manuever (see diagrams/instructions below) at home up to 3 times a day for 1-2 weeks or until symptoms resolve - You may take Meclizine as needed up to 3 times a day for dizziness, this will not cure symptoms but may help. Caution may make you drowsy.  If you develop significant worsening episode with vertigo that does not improve and you get severe headache, loss of vision, arm or leg weakness, slurred speech, or other concerning symptoms please seek immediate medical attention at Emergency Department.   See the next page for images describing the Epley Manuever.     ----------------------------------------------------------------------------------------------------------------------        Please schedule a Follow-up Appointment to: Return if symptoms worsen or fail to improve.  If you have any other questions or concerns, please feel free to call the office or send a message through MyChart. You may also schedule an earlier appointment if necessary.  Additionally, you may be receiving a survey about your experience at our office within a few days to 1 week by e-mail or mail. We value your feedback.  Saralyn Pilar, DO [facility], [place]

## 2024-01-10 NOTE — Progress Notes (Signed)
Subjective:    Patient ID: Michael Delacruz, male    DOB: 08/13/1941, 83 y.o.   MRN: [id number]  Michael Delacruz is a 83 y.o. male presenting on 01/10/2024 for Hip Injury (/)   HPI  HOSPITAL FOLLOW-UP VISIT  Discussed the use of AI scribe software for clinical note transcription with the patient, who gave verbal consent to proceed.  History of Present Illness    Hospital/Location: [facility] Date of Admission: 12/21/23 Date of Discharge: 12/29/23 Transitions of care telephone call: completed on 01/06/24  Reason for Admission: Left Femur Fracture Injury  - Hospital H&P and Discharge Summary have been reviewed - Patient presents today 12 days after recent hospitalization  Originally consider discharge to [facility] for SNF but he improved and then was discharged to home.  The patient, with a recent history of a left distal femur fracture, presents for follow-up after hospitalization. The fracture was surgically managed, and the patient is currently in the recovery phase, which has been ongoing for approximately three weeks. The patient reports persistent pain at the surgical site, with no significant improvement noted since the operation. The surgical staples are still in place due to a missed follow-up appointment caused by episodes of dizziness. Upcoming apt now scheduled.  The patient's mobility is currently limited, with no weight-bearing allowed on the affected leg. The patient uses a wheelchair and walker for mobility. The patient also reports difficulty sleeping, with frequent awakenings and a feeling of restlessness. This has been a persistent issue since the fall that resulted in the femur fracture.  During the hospital stay, the patient was given Zoloft and Seroquel, which reportedly led to a negative change in the patient's personality. The patient has since stopped taking these medications. The patient also reports a painful oral ulcer that is affecting his ability to eat. -  Chart review shows he started Seroquel back in 2021 approximately in hospital before for sleep insomnia. He remains off meds now. - He did not tolerate Ambien well in the past.  The patient has been experiencing episodes of vertigo, particularly upon standing, which has led to a fear of falling. These episodes have been increasing in frequency over the past week. The patient has declined medication for this issue, opting to manage it with caution during position changes.     I have reviewed the discharge medication list, and have reconciled the current and discharge medications today.   Current Outpatient Medications:    albuterol (VENTOLIN HFA) 108 (90 Base) MCG/ACT inhaler, Inhale 2 puffs into the lungs every 6 (six) hours as needed for wheezing or shortness of breath., Disp: 8 g, Rfl: 0   furosemide (LASIX) 40 MG tablet, Take 40 mg by mouth daily. As needed, Disp: , Rfl:    magic mouthwash w/lidocaine SOLN, Swish, gargle, and spit one to two teaspoonfuls every six hours as needed. Shake well before using., Disp: 120 mL, Rfl: 0   oxycodone (OXY-IR) 5 MG capsule, Take 5 mg by mouth every 6 (six) hours as needed. As needed, Disp: , Rfl:    traZODone (DESYREL) 50 MG tablet, Take 1 tablet (50 mg total) by mouth at bedtime., Disp: 30 tablet, Rfl: 2   aspirin 325 MG tablet, Take 1 tablet (325 mg total) by mouth daily. (Patient not taking: Reported on 01/10/2024), Disp: 90 tablet, Rfl: 0  ------------------------------------------------------------------------- Social History   Tobacco Use   Smoking status: Every Day    Current packs/day: 0.50    Average packs/day: 0.5  packs/day for 65.1 years (32.5 ttl pk-yrs)    Types: Cigarettes    Start date: 57   Smokeless tobacco: Never   Tobacco comments:    Resumed smoking, after quit 01/2018  Vaping Use   Vaping status: Never Used  Substance Use Topics   Alcohol use: Not Currently    Alcohol/week: 1.0 standard drink of alcohol    Types: 1  Standard drinks or equivalent per week    Comment: occasionally drinks a margarita   Drug use: No    Review of Systems Per HPI unless specifically indicated above     Objective:    BP 124/70   Pulse 84   Ht 6' (1.829 m)   SpO2 99%   BMI 23.98 kg/m   Wt Readings from Last 3 Encounters:  12/22/23 176 lb 12.9 oz (80.2 kg)  10/25/23 183 lb 3.2 oz (83.1 kg)  04/15/23 185 lb (83.9 kg)    Physical Exam Vitals and nursing note reviewed.  Constitutional:      General: He is not in acute distress.    Appearance: Normal appearance. He is well-developed. He is not diaphoretic.     Comments: Well-appearing, comfortable, cooperative  HENT:     Head: Normocephalic and atraumatic.  Eyes:     General:        Right eye: No discharge.        Left eye: No discharge.     Conjunctiva/sclera: Conjunctivae normal.  Cardiovascular:     Rate and Rhythm: Normal rate.  Pulmonary:     Effort: Pulmonary effort is normal.  Musculoskeletal:     Comments: Seated in wheelchair. Non weight bearing. Left lower extremity in large brace, unable to flex L knee. Has staples in place post op incision.  Skin:    General: Skin is warm and dry.     Findings: No erythema or rash.  Neurological:     Mental Status: He is alert and oriented to person, place, and time.  Psychiatric:        Mood and Affect: Mood normal.        Behavior: Behavior normal.        Thought Content: Thought content normal.     Comments: Well groomed, good eye contact, normal speech and thoughts       Results for orders placed or performed during the hospital encounter of 12/21/23  CBC with Differential   Collection Time: 12/21/23  4:22 PM  Result Value Ref Range   WBC 4.2 4.0 - 10.5 K/uL   RBC 4.66 4.22 - 5.81 MIL/uL   Hemoglobin 14.5 13.0 - 17.0 g/dL   HCT 41.3 24.4 - 01.0 %   MCV 95.5 80.0 - 100.0 fL   MCH 31.1 26.0 - 34.0 pg   MCHC 32.6 30.0 - 36.0 g/dL   RDW 27.2 53.6 - 64.4 %   Platelets 126 (L) 150 - 400 K/uL    nRBC 0.0 0.0 - 0.2 %   Neutrophils Relative % 63 %   Neutro Abs 2.6 1.7 - 7.7 K/uL   Lymphocytes Relative 21 %   Lymphs Abs 0.9 0.7 - 4.0 K/uL   Monocytes Relative 12 %   Monocytes Absolute 0.5 0.1 - 1.0 K/uL   Eosinophils Relative 3 %   Eosinophils Absolute 0.1 0.0 - 0.5 K/uL   Basophils Relative 1 %   Basophils Absolute 0.0 0.0 - 0.1 K/uL   Immature Granulocytes 0 %   Abs Immature Granulocytes 0.01 0.00 - 0.07 K/uL  Basic metabolic panel   Collection Time: 12/21/23  4:22 PM  Result Value Ref Range   Sodium 139 135 - 145 mmol/L   Potassium 3.4 (L) 3.5 - 5.1 mmol/L   Chloride 103 98 - 111 mmol/L   CO2 24 22 - 32 mmol/L   Glucose, Bld 107 (H) 70 - 99 mg/dL   BUN 11 8 - 23 mg/dL   Creatinine, Ser 7.82 0.61 - 1.24 mg/dL   Calcium 8.8 (L) 8.9 - 10.3 mg/dL   GFR, Estimated 59 (L) >60 mL/min   Anion gap 12 5 - 15  Troponin I (High Sensitivity)   Collection Time: 12/21/23  4:22 PM  Result Value Ref Range   Troponin I (High Sensitivity) 13 <18 ng/L  Troponin I (High Sensitivity)   Collection Time: 12/21/23  6:13 PM  Result Value Ref Range   Troponin I (High Sensitivity) 13 <18 ng/L  Surgical PCR screen   Collection Time: 12/22/23  3:05 AM   Specimen: Nasal Mucosa; Nasal Swab  Result Value Ref Range   MRSA, PCR NEGATIVE NEGATIVE   Staphylococcus aureus NEGATIVE NEGATIVE  CBC   Collection Time: 12/22/23  3:44 AM  Result Value Ref Range   WBC 4.5 4.0 - 10.5 K/uL   RBC 4.08 (L) 4.22 - 5.81 MIL/uL   Hemoglobin 12.9 (L) 13.0 - 17.0 g/dL   HCT 95.6 (L) 21.3 - 08.6 %   MCV 93.6 80.0 - 100.0 fL   MCH 31.6 26.0 - 34.0 pg   MCHC 33.8 30.0 - 36.0 g/dL   RDW 57.8 46.9 - 62.9 %   Platelets 126 (L) 150 - 400 K/uL   nRBC 0.0 0.0 - 0.2 %  Potassium   Collection Time: 12/22/23  3:44 AM  Result Value Ref Range   Potassium 3.5 3.5 - 5.1 mmol/L  ECHOCARDIOGRAM COMPLETE   Collection Time: 12/22/23  9:52 AM  Result Value Ref Range   Weight 2,828.94 oz   Height 72 in   BP 129/79 mmHg    Ao pk vel 1.16 m/s   AV Area VTI 2.61 cm2   AR max vel 2.52 cm2   AV Mean grad 3.0 mmHg   AV Peak grad 5.4 mmHg   Single Plane A2C EF 53.5 %   Single Plane A4C EF 47.9 %   Calc EF 51.6 %   S' Lateral 3.30 cm   AV Area mean vel 2.65 cm2   Area-P 1/2 2.24 cm2   MV VTI 1.13 cm2   Est EF 50 - 55%   Basic metabolic panel   Collection Time: 12/23/23  5:48 AM  Result Value Ref Range   Sodium 134 (L) 135 - 145 mmol/L   Potassium 3.9 3.5 - 5.1 mmol/L   Chloride 100 98 - 111 mmol/L   CO2 24 22 - 32 mmol/L   Glucose, Bld 139 (H) 70 - 99 mg/dL   BUN 21 8 - 23 mg/dL   Creatinine, Ser 5.28 (H) 0.61 - 1.24 mg/dL   Calcium 8.4 (L) 8.9 - 10.3 mg/dL   GFR, Estimated 56 (L) >60 mL/min   Anion gap 10 5 - 15  CBC   Collection Time: 12/23/23  5:48 AM  Result Value Ref Range   WBC 7.5 4.0 - 10.5 K/uL   RBC 3.81 (L) 4.22 - 5.81 MIL/uL   Hemoglobin 11.9 (L) 13.0 - 17.0 g/dL   HCT 41.3 (L) 24.4 - 01.0 %   MCV 94.2 80.0 - 100.0 fL   MCH  31.2 26.0 - 34.0 pg   MCHC 33.1 30.0 - 36.0 g/dL   RDW 71.0 62.6 - 94.8 %   Platelets 124 (L) 150 - 400 K/uL   nRBC 0.0 0.0 - 0.2 %  CBC   Collection Time: 12/25/23  4:51 AM  Result Value Ref Range   WBC 4.8 4.0 - 10.5 K/uL   RBC 3.52 (L) 4.22 - 5.81 MIL/uL   Hemoglobin 11.1 (L) 13.0 - 17.0 g/dL   HCT 54.6 (L) 27.0 - 35.0 %   MCV 92.9 80.0 - 100.0 fL   MCH 31.5 26.0 - 34.0 pg   MCHC 33.9 30.0 - 36.0 g/dL   RDW 09.3 81.8 - 29.9 %   Platelets 125 (L) 150 - 400 K/uL   nRBC 0.0 0.0 - 0.2 %  Basic metabolic panel   Collection Time: 12/25/23  4:51 AM  Result Value Ref Range   Sodium 135 135 - 145 mmol/L   Potassium 3.9 3.5 - 5.1 mmol/L   Chloride 104 98 - 111 mmol/L   CO2 25 22 - 32 mmol/L   Glucose, Bld 102 (H) 70 - 99 mg/dL   BUN 20 8 - 23 mg/dL   Creatinine, Ser 3.71 0.61 - 1.24 mg/dL   Calcium 8.6 (L) 8.9 - 10.3 mg/dL   GFR, Estimated >69 >67 mL/min   Anion gap 6 5 - 15  Urinalysis, w/ Reflex to Culture (Infection Suspected) -Urine, Clean Catch    Collection Time: 12/25/23  9:46 AM  Result Value Ref Range   Specimen Source URINE, CLEAN CATCH    Color, Urine AMBER (A) YELLOW   APPearance CLEAR (A) CLEAR   Specific Gravity, Urine 1.026 1.005 - 1.030   pH 5.0 5.0 - 8.0   Glucose, UA NEGATIVE NEGATIVE mg/dL   Hgb urine dipstick SMALL (A) NEGATIVE   Bilirubin Urine NEGATIVE NEGATIVE   Ketones, ur NEGATIVE NEGATIVE mg/dL   Protein, ur NEGATIVE NEGATIVE mg/dL   Nitrite NEGATIVE NEGATIVE   Leukocytes,Ua NEGATIVE NEGATIVE   RBC / HPF 21-50 0 - 5 RBC/hpf   WBC, UA 0-5 0 - 5 WBC/hpf   Bacteria, UA NONE SEEN NONE SEEN   Squamous Epithelial / HPF 0 0 - 5 /HPF   Mucus PRESENT   Sedimentation rate   Collection Time: 12/26/23 10:24 AM  Result Value Ref Range   Sed Rate 61 (H) 0 - 20 mm/hr  Basic metabolic panel   Collection Time: 12/26/23 10:24 AM  Result Value Ref Range   Sodium 133 (L) 135 - 145 mmol/L   Potassium 3.6 3.5 - 5.1 mmol/L   Chloride 100 98 - 111 mmol/L   CO2 21 (L) 22 - 32 mmol/L   Glucose, Bld 169 (H) 70 - 99 mg/dL   BUN 16 8 - 23 mg/dL   Creatinine, Ser 8.93 0.61 - 1.24 mg/dL   Calcium 8.5 (L) 8.9 - 10.3 mg/dL   GFR, Estimated >81 >01 mL/min   Anion gap 12 5 - 15  CBC   Collection Time: 12/26/23 10:24 AM  Result Value Ref Range   WBC 5.1 4.0 - 10.5 K/uL   RBC 3.47 (L) 4.22 - 5.81 MIL/uL   Hemoglobin 10.9 (L) 13.0 - 17.0 g/dL   HCT 75.1 (L) 02.5 - 85.2 %   MCV 90.2 80.0 - 100.0 fL   MCH 31.4 26.0 - 34.0 pg   MCHC 34.8 30.0 - 36.0 g/dL   RDW 77.8 24.2 - 35.3 %   Platelets 164 150 -  400 K/uL   nRBC 0.0 0.0 - 0.2 %  CBC   Collection Time: 12/28/23  3:56 AM  Result Value Ref Range   WBC 7.2 4.0 - 10.5 K/uL   RBC 4.00 (L) 4.22 - 5.81 MIL/uL   Hemoglobin 12.3 (L) 13.0 - 17.0 g/dL   HCT 91.4 (L) 78.2 - 95.6 %   MCV 93.0 80.0 - 100.0 fL   MCH 30.8 26.0 - 34.0 pg   MCHC 33.1 30.0 - 36.0 g/dL   RDW 21.3 08.6 - 57.8 %   Platelets 289 150 - 400 K/uL   nRBC 0.0 0.0 - 0.2 %  Basic metabolic panel   Collection  Time: 12/28/23  3:56 AM  Result Value Ref Range   Sodium 137 135 - 145 mmol/L   Potassium 4.0 3.5 - 5.1 mmol/L   Chloride 103 98 - 111 mmol/L   CO2 22 22 - 32 mmol/L   Glucose, Bld 111 (H) 70 - 99 mg/dL   BUN 16 8 - 23 mg/dL   Creatinine, Ser 4.69 0.61 - 1.24 mg/dL   Calcium 9.2 8.9 - 62.9 mg/dL   GFR, Estimated >52 >84 mL/min   Anion gap 12 5 - 15      Assessment & Plan:   Problem List Items Addressed This Visit     Closed fracture of left distal femur (HCC)   Other Visit Diagnoses       Left leg pain    -  Primary     Psychophysiological insomnia       Relevant Medications   traZODone (DESYREL) 50 MG tablet     Oral ulcer       Relevant Medications   magic mouthwash w/lidocaine SOLN     Benign paroxysmal positional vertigo due to bilateral vestibular disorder            LEFT Distal Femur Fracture Post-operative recovery from left distal femur fracture. Staples still in place. Pain reported. Non-weight bearing with mobility via walker. -Next orthopedic follow-up on 01/26/2024 for staple removal and x-ray to assess healing.  Insomnia Difficulty sleeping and staying asleep. Previous use of Seroquel and Zoloft in the hospital setting. Avoid Ambien, intolerance in past. Remain off Seroquel.  -Start Trazodone 50mg  nightly. -Reassess effectiveness in a few weeks and adjust dose as needed.  Oral Ulcer Painful oral ulcer affecting eating. -Prescribe Magic Mouthwash for symptomatic relief. Printed Rx  Vertigo, BPPV suspected Reports of dizzy spells, particularly with position changes. -Provide home exercise instructions for vertigo management. -Consider over-the-counter Dramamine for symptomatic relief if needed.  General Health Maintenance -Consider updated pneumonia vaccine. -Follow-up as needed or in a few weeks to assess response to new medications.        Meds ordered this encounter  Medications   traZODone (DESYREL) 50 MG tablet    Sig: Take 1 tablet (50  mg total) by mouth at bedtime.    Dispense:  30 tablet    Refill:  2   magic mouthwash w/lidocaine SOLN    Sig: Swish, gargle, and spit one to two teaspoonfuls every six hours as needed. Shake well before using.    Dispense:  120 mL    Refill:  0    1 Part viscous lidocaine 2%  1 Part Maalox  1 Part diphenhydramine 12.5 mg per 5 ml elixir 1 Part Nystatin    Follow up plan: Return if symptoms worsen or fail to improve.   Saralyn Pilar, DO [facility] 01/10/2024, 6:33 PM

## 2024-01-11 DIAGNOSIS — I442 Atrioventricular block, complete: Secondary | ICD-10-CM | POA: Diagnosis not present

## 2024-01-11 DIAGNOSIS — I5022 Chronic systolic (congestive) heart failure: Secondary | ICD-10-CM | POA: Diagnosis not present

## 2024-01-11 DIAGNOSIS — I272 Pulmonary hypertension, unspecified: Secondary | ICD-10-CM | POA: Diagnosis not present

## 2024-01-11 DIAGNOSIS — N189 Chronic kidney disease, unspecified: Secondary | ICD-10-CM | POA: Diagnosis not present

## 2024-01-11 DIAGNOSIS — S72402D Unspecified fracture of lower end of left femur, subsequent encounter for closed fracture with routine healing: Secondary | ICD-10-CM | POA: Diagnosis not present

## 2024-01-11 DIAGNOSIS — I4892 Unspecified atrial flutter: Secondary | ICD-10-CM | POA: Diagnosis not present

## 2024-01-11 DIAGNOSIS — J4489 Other specified chronic obstructive pulmonary disease: Secondary | ICD-10-CM | POA: Diagnosis not present

## 2024-01-11 DIAGNOSIS — I4821 Permanent atrial fibrillation: Secondary | ICD-10-CM | POA: Diagnosis not present

## 2024-01-11 DIAGNOSIS — I130 Hypertensive heart and chronic kidney disease with heart failure and stage 1 through stage 4 chronic kidney disease, or unspecified chronic kidney disease: Secondary | ICD-10-CM | POA: Diagnosis not present

## 2024-01-12 ENCOUNTER — Telehealth: Payer: Self-pay

## 2024-01-12 ENCOUNTER — Other Ambulatory Visit: Payer: Self-pay

## 2024-01-12 NOTE — Progress Notes (Signed)
EPIC Encounter for ICM Monitoring  Patient Name: Michael Delacruz is a 83 y.o. male Date: 01/12/2024 Primary Care Physican: Smitty Cords, DO Primary Cardiologist: Mariah Milling Electrophysiologist: Joycelyn Schmid Pacing: 97%            03/16/2023 Weight: 175-178 lbs 06/27/2023 Weight: 174 lbs 01/12/2024 Weight: 175 lbs                                                            Spoke with patient and heart failure questions reviewed.  Transmission results reviewed.  Pt asymptomatic for fluid accumulation.   Using a walker after having a fall on 1/1 and wearing a cast/boot at home.  He expecting the boot to be removed 2/6.  Hospitalized 1/1-1/9 for fall.    Diet:  Does not follow low salt diet and uses salt at home.     CorVue thoracic impedance suggesting normal fluid levels with the exception of possible fluid accumulation from 1/2-1/14.      Prescribed: Furosemide 40 mg Take 1 tablet (40 mg total) by mouth daily as needed (As needed for weight gain or shortness of breath).   Recommendations:  No changes and encouraged to call if experiencing any fluid symptoms.   Follow-up plan: ICM clinic phone appointment on 02/13/2024.   91 day device clinic remote transmission 01/24/2024.     EP/Cardiology Office Visits:  02/27/2024 with Dr. Mariah Milling.  Next EP visit due 10/2024 with Dr Graciela Husbands but no recall.    Copy of ICM check sent to Dr. Graciela Husbands.  3 month ICM trend: 01/10/2024.    12-14 Month ICM trend:     Karie Soda, RN 01/12/2024 4:14 PM

## 2024-01-12 NOTE — Patient Instructions (Signed)
Visit Information  Thank you for taking time to visit with me today. Please don't hesitate to contact me if I can be of assistance to you before our next scheduled telephone appointment.  Following is a copy of your care plan:   Goals Addressed             This Visit's Progress    COMPLETED: TOC Care Plan       Current Barriers:  Knowledge Deficits related to plan of care for management of CAD and Post Op Pain   RNCM Clinical Goal(s):  Patient will work with the Care Management team over the next 30 days to address Transition of Care Barriers: Medication access Medication Management Diet/Nutrition/Food Resources Support at home Provider appointments Home Health services Equipment/DME Functional/Safety verbalize basic understanding of  CAD and Post Op Pain disease process and self health management plan as evidenced by no hospital admissions in the next 30 days demonstrate understanding of rationale for each prescribed medication as evidenced by verbal feedack  through collaboration with RN Care manager, provider, and care team. (Reviewed 01/12/24)  Interventions: Evaluation of current treatment plan related to  self management and patient's adherence to plan as established by provider (Reviewed 01/12/24)   CAD Interventions: (Status:  New goal.) Short Term Goal  (Reviewed 01/12/24) Assessed understanding of CAD diagnosis Medications reviewed including medications utilized in CAD treatment plan Provided education on Importance of limiting foods high in cholesterol Counseled on the importance of exercise goals with target of 150 minutes per week Reviewed Importance of taking all medications as prescribed Reviewed Importance of attending all scheduled provider appointments Screening for signs and symptoms of depression related to chronic disease state Assessed social determinant of health barriers  Patient Goals/Self-Care Activities:  (Reviewed 01/12/24) Participate in Transition  of Care Program/Attend [facility] scheduled calls Notify RN Care Manager of TOC call rescheduling needs Take all medications as prescribed Attend all scheduled provider appointments Call pharmacy for medication refills 3-7 days in advance of running out of medications Perform all self care activities independently   Follow Up Plan:  The patient is discharged from [facility]. The patient and his wife feel like things have stabilized for them and they do not need further calls.         The patient verbalized understanding of instructions, educational materials, and care plan provided today and agreed to receive a mailed copy of patient instructions, educational materials, and care plan.   No Further follow up required  Please call the care guide team at [phone] if you need to cancel or reschedule your appointment.   Please call the Suicide and Crisis Lifeline: 988 call the [place] National Suicide Prevention Lifeline: (757) 374-5411 or TTY: 732-726-9335 TTY (720) 540-7781) to talk to a trained counselor if you are experiencing a Mental Health or Behavioral Health Crisis or need someone to talk to.  [name], BSN, RN [place]  [facility] - [employer] [facility] RN Care Manager [phone]

## 2024-01-12 NOTE — Patient Outreach (Signed)
Care Management  Transitions of Care Program Transitions of Care Post-discharge week 3   01/12/2024 Name: Michael Delacruz MRN: [id number] DOB: Jul 31, 1941  Subjective: Michael Delacruz is a 83 y.o. year old male who is a primary care patient of Smitty Cords, DO. The Care Management team Engaged with patient Engaged with patient by telephone to assess and address transitions of care needs.   Consent to Services:  Patient was given information about care management services, agreed to services, and gave verbal consent to participate.   Assessment:  TOC Outreach provided today. The patient went to see the PCP two days ago. He was given a prescription for Magic Mouthwash for the ulcers in his mouth. He eats a soft diet and drinks protein shakes for nutrition. HHPT has discharged the patient from services until he sees the Orthopedist and has his staples removed. The patient is putting some weight on his leg and is trying to keep moving. He was given some medication to help him sleep. His dizziness has improved and he was given a home vestibular exercise program and recommended Dramamine as needed. The patient and his wife feel that things are stable and do not need further follow up phone calls at this time.   SDOH Interventions    Flowsheet Row Telephone from 01/12/2024 in [facility] POPULATION HEALTH DEPARTMENT Office Visit from 01/10/2024 in [facility] Patient Outreach from 01/06/2024 in [place] POPULATION HEALTH DEPARTMENT Telephone from 12/30/2023 in [place] POPULATION HEALTH DEPARTMENT Telephone from 10/26/2023 in [facility] Clinical Support from 01/28/2023 in [facility]  SDOH Interventions        Food Insecurity Interventions Intervention Not Indicated -- -- [employer] Provided Other (Comment)  [will mail SNAP FNS card as multiple additional family members in the home] Intervention  Not Indicated  Housing Interventions Intervention Not Indicated -- -- Intervention Not Indicated Intervention Not Indicated Intervention Not Indicated  Transportation Interventions Intervention Not Indicated -- -- Intervention Not Indicated Intervention Not Indicated, Patient Resources (Friends/Family), Payor Benefit Intervention Not Indicated  Utilities Interventions Intervention Not Indicated -- -- Intervention Not Indicated Other (Comment)  [sent LIEAP application for energy expenses- applications open 11/20/23] Intervention Not Indicated  Alcohol Usage Interventions Intervention Not Indicated (Score <7) -- Intervention Not Indicated (Score <7) -- -- Intervention Not Indicated (Score <7)  Depression Interventions/Treatment  PHQ2-9 Score <4 Follow-up Not Indicated Counseling, Medication -- -- -- --  Financial Strain Interventions Intervention Not Indicated -- Intervention Not Indicated -- Other (Comment)  [fixed income- LIEAP, Wellsite geologist, SHIIP information] Intervention Not Indicated  Physical Activity Interventions Intervention Not Indicated -- Intervention Not Indicated -- -- Patient Refused  Stress Interventions Intervention Not Indicated -- Intervention Not Indicated -- -- Intervention Not Indicated  Social Connections Interventions Intervention Not Indicated -- -- Patient Declined -- Intervention Not Indicated  Health Literacy Interventions Intervention Not Indicated -- Intervention Not Indicated -- Intervention Not Indicated --        Goals Addressed             This Visit's Progress    COMPLETED: TOC Care Plan       Current Barriers:  Knowledge Deficits related to plan of care for management of CAD and Post Op Pain   RNCM Clinical Goal(s):  Patient will work with the Care Management team over the next 30 days to address Transition of Care Barriers: Medication access Medication Management Diet/Nutrition/Food Resources  Support at home Provider appointments Home  Health services Equipment/DME Functional/Safety verbalize basic understanding of  CAD and Post Op Pain disease process and self health management plan as evidenced by no hospital admissions in the next 30 days demonstrate understanding of rationale for each prescribed medication as evidenced by verbal feedack  through collaboration with RN Care manager, provider, and care team. (Reviewed 01/12/24)  Interventions: Evaluation of current treatment plan related to  self management and patient's adherence to plan as established by provider (Reviewed 01/12/24)   CAD Interventions: (Status:  New goal.) Short Term Goal  (Reviewed 01/12/24) Assessed understanding of CAD diagnosis Medications reviewed including medications utilized in CAD treatment plan Provided education on Importance of limiting foods high in cholesterol Counseled on the importance of exercise goals with target of 150 minutes per week Reviewed Importance of taking all medications as prescribed Reviewed Importance of attending all scheduled provider appointments Screening for signs and symptoms of depression related to chronic disease state Assessed social determinant of health barriers  Patient Goals/Self-Care Activities:  (Reviewed 01/12/24) Participate in Transition of Care Program/Attend [facility] scheduled calls Notify RN Care Manager of TOC call rescheduling needs Take all medications as prescribed Attend all scheduled provider appointments Call pharmacy for medication refills 3-7 days in advance of running out of medications Perform all self care activities independently   Follow Up Plan:  The patient is discharged from [facility]. The patient and his wife feel like things have stabilized for them and they do not need further calls.         Plan: The patient has been provided with contact information for the care management team and has been advised to call with any health related questions or concerns.   [name], BSN,  RN [place]  VBCI - [employer] [facility] RN Care Manager [phone]

## 2024-01-13 DIAGNOSIS — I272 Pulmonary hypertension, unspecified: Secondary | ICD-10-CM | POA: Diagnosis not present

## 2024-01-13 DIAGNOSIS — N189 Chronic kidney disease, unspecified: Secondary | ICD-10-CM | POA: Diagnosis not present

## 2024-01-13 DIAGNOSIS — I442 Atrioventricular block, complete: Secondary | ICD-10-CM | POA: Diagnosis not present

## 2024-01-13 DIAGNOSIS — J4489 Other specified chronic obstructive pulmonary disease: Secondary | ICD-10-CM | POA: Diagnosis not present

## 2024-01-13 DIAGNOSIS — I4821 Permanent atrial fibrillation: Secondary | ICD-10-CM | POA: Diagnosis not present

## 2024-01-13 DIAGNOSIS — I4892 Unspecified atrial flutter: Secondary | ICD-10-CM | POA: Diagnosis not present

## 2024-01-13 DIAGNOSIS — I5022 Chronic systolic (congestive) heart failure: Secondary | ICD-10-CM | POA: Diagnosis not present

## 2024-01-13 DIAGNOSIS — I130 Hypertensive heart and chronic kidney disease with heart failure and stage 1 through stage 4 chronic kidney disease, or unspecified chronic kidney disease: Secondary | ICD-10-CM | POA: Diagnosis not present

## 2024-01-13 DIAGNOSIS — S72402D Unspecified fracture of lower end of left femur, subsequent encounter for closed fracture with routine healing: Secondary | ICD-10-CM | POA: Diagnosis not present

## 2024-01-17 ENCOUNTER — Ambulatory Visit: Payer: Self-pay | Admitting: *Deleted

## 2024-01-17 DIAGNOSIS — N189 Chronic kidney disease, unspecified: Secondary | ICD-10-CM | POA: Diagnosis not present

## 2024-01-17 DIAGNOSIS — I442 Atrioventricular block, complete: Secondary | ICD-10-CM | POA: Diagnosis not present

## 2024-01-17 DIAGNOSIS — I272 Pulmonary hypertension, unspecified: Secondary | ICD-10-CM | POA: Diagnosis not present

## 2024-01-17 DIAGNOSIS — I130 Hypertensive heart and chronic kidney disease with heart failure and stage 1 through stage 4 chronic kidney disease, or unspecified chronic kidney disease: Secondary | ICD-10-CM | POA: Diagnosis not present

## 2024-01-17 DIAGNOSIS — F5104 Psychophysiologic insomnia: Secondary | ICD-10-CM

## 2024-01-17 DIAGNOSIS — I4821 Permanent atrial fibrillation: Secondary | ICD-10-CM | POA: Diagnosis not present

## 2024-01-17 DIAGNOSIS — I5022 Chronic systolic (congestive) heart failure: Secondary | ICD-10-CM | POA: Diagnosis not present

## 2024-01-17 DIAGNOSIS — S72402D Unspecified fracture of lower end of left femur, subsequent encounter for closed fracture with routine healing: Secondary | ICD-10-CM | POA: Diagnosis not present

## 2024-01-17 DIAGNOSIS — J4489 Other specified chronic obstructive pulmonary disease: Secondary | ICD-10-CM | POA: Diagnosis not present

## 2024-01-17 DIAGNOSIS — I4892 Unspecified atrial flutter: Secondary | ICD-10-CM | POA: Diagnosis not present

## 2024-01-17 NOTE — Telephone Encounter (Signed)
Spoke with [employer]. They will increase his Trazadone to 100 mg at night for a week or 2. And let the office know how that helped.   Verbal understanding

## 2024-01-17 NOTE — Telephone Encounter (Signed)
Please notify patient  If it was successful on the first night, it has the potential to work at higher doses. I would usually dose increase up to 2 pills = 100mg  nightly next.  The tablet does come in a 100mg  dose and a 150mg  dose.  It is safe to take higher doses in future if needed.   He should double dose to 100mg  and try that nightly for 1-2 weeks to see if better result.  He will likely be at the end of his first pill bottle. He can call back and let us know if want to try 100mg  dose or if still not effective we can try 150mg  dose.  Saralyn Pilar, DO [facility] 01/17/2024, 3:42 PM

## 2024-01-17 NOTE — Telephone Encounter (Signed)
Message from [place] C sent at 01/17/2024  2:44 PM EST  Summary: rx concern   The patient has been recently prescribed traZODone (DESYREL) 50 MG tablet [[id number]] for their sleep concerns  The patient shares with their spouse that the medication has been ineffective  The patient's spouse would like to speak with a member of clinical staff about the prescription further  Please contact when possible          Call History  Contact Date/Time Type Contact Phone/Fax By  01/17/2024 02:43 PM EST Phone (Incoming) Cicero Duck P (Emergency Contact)  Izora Ribas, [place] A   Reason for Disposition  [1] Caller has URGENT medicine question about med that PCP or specialist prescribed AND [2] triager unable to answer question    Trazodone 50 mg not helping him sleep.  Answer Assessment - Initial Assessment Questions 1. NAME of MEDICINE: "What medicine(s) are you calling about?"     Trazodone 50 mg First night it worded great.  He went right to sleep and slept good but after that it's not working. It's not keeping him asleep and when he wakes up he wakes up suddenly.   It's like he doesn't know what is happening for a few seconds.  Dr. Belva Chimes if it did not work to let him know and he would either increase the dose or try something else.  I let her know I would get the message to him.       2. QUESTION: "What is your question?" (e.g., double dose of medicine, side effect)     It's not keeping him asleep. 3. PRESCRIBER: "Who prescribed the medicine?" Reason: if prescribed by specialist, call should be referred to that group.     Dr. Althea Charon. 4. SYMPTOMS: "Do you have any symptoms?" If Yes, ask: "What symptoms are you having?"  "How bad are the symptoms (e.g., mild, moderate, severe)     It's not keeping him asleep.   When he wakes up during the night he can't get back to sleep.   He wakes up suddenly during the night. That first night he went to sleep and slept great but after that it  has not been working. 5. PREGNANCY:  "Is there any chance that you are pregnant?" "When was your last menstrual period?"     N/A  Protocols used: Medication Question Call-A-AH

## 2024-01-17 NOTE — Telephone Encounter (Signed)
  Chief Complaint: Trazodone 50 mg not helping him sleep.   The first night it worked great.   He went to sleep and slept all night.   Symptoms: Now he is not sleeping all night.   When he wakes up during the night it's sudden and then he is not able to get back to sleep. Frequency: Every night except the first night he took it. Pertinent Negatives: Patient denies being able to get back to sleep once he wakes up. Disposition: [] ED /[] Urgent Care (no appt availability in office) / [] Appointment(In office/virtual)/ []  [place] [facility]/ [] Home Care/ [] Refused Recommended Disposition /[] [place] Mobile Bus/ [x]  Follow-up with PCP Additional Notes: Message sent to Dr. Althea Charon.   Wife [name] agreeable to someone calling her back with Dr. Althea Charon' suggestion.

## 2024-01-17 NOTE — Addendum Note (Signed)
Addended by: Smitty Cords on: 01/17/2024 03:42 PM   Modules accepted: Orders

## 2024-01-18 DIAGNOSIS — I442 Atrioventricular block, complete: Secondary | ICD-10-CM | POA: Diagnosis not present

## 2024-01-18 DIAGNOSIS — S72402D Unspecified fracture of lower end of left femur, subsequent encounter for closed fracture with routine healing: Secondary | ICD-10-CM | POA: Diagnosis not present

## 2024-01-18 DIAGNOSIS — J4489 Other specified chronic obstructive pulmonary disease: Secondary | ICD-10-CM | POA: Diagnosis not present

## 2024-01-18 DIAGNOSIS — I5022 Chronic systolic (congestive) heart failure: Secondary | ICD-10-CM | POA: Diagnosis not present

## 2024-01-18 DIAGNOSIS — I130 Hypertensive heart and chronic kidney disease with heart failure and stage 1 through stage 4 chronic kidney disease, or unspecified chronic kidney disease: Secondary | ICD-10-CM | POA: Diagnosis not present

## 2024-01-18 DIAGNOSIS — I272 Pulmonary hypertension, unspecified: Secondary | ICD-10-CM | POA: Diagnosis not present

## 2024-01-18 DIAGNOSIS — N189 Chronic kidney disease, unspecified: Secondary | ICD-10-CM | POA: Diagnosis not present

## 2024-01-18 DIAGNOSIS — I4892 Unspecified atrial flutter: Secondary | ICD-10-CM | POA: Diagnosis not present

## 2024-01-18 DIAGNOSIS — I4821 Permanent atrial fibrillation: Secondary | ICD-10-CM | POA: Diagnosis not present

## 2024-01-23 DIAGNOSIS — I5022 Chronic systolic (congestive) heart failure: Secondary | ICD-10-CM | POA: Diagnosis not present

## 2024-01-23 DIAGNOSIS — I4892 Unspecified atrial flutter: Secondary | ICD-10-CM | POA: Diagnosis not present

## 2024-01-23 DIAGNOSIS — J4489 Other specified chronic obstructive pulmonary disease: Secondary | ICD-10-CM | POA: Diagnosis not present

## 2024-01-23 DIAGNOSIS — I272 Pulmonary hypertension, unspecified: Secondary | ICD-10-CM | POA: Diagnosis not present

## 2024-01-23 DIAGNOSIS — I4821 Permanent atrial fibrillation: Secondary | ICD-10-CM | POA: Diagnosis not present

## 2024-01-23 DIAGNOSIS — S72402D Unspecified fracture of lower end of left femur, subsequent encounter for closed fracture with routine healing: Secondary | ICD-10-CM | POA: Diagnosis not present

## 2024-01-23 DIAGNOSIS — I130 Hypertensive heart and chronic kidney disease with heart failure and stage 1 through stage 4 chronic kidney disease, or unspecified chronic kidney disease: Secondary | ICD-10-CM | POA: Diagnosis not present

## 2024-01-23 DIAGNOSIS — I442 Atrioventricular block, complete: Secondary | ICD-10-CM | POA: Diagnosis not present

## 2024-01-23 DIAGNOSIS — N189 Chronic kidney disease, unspecified: Secondary | ICD-10-CM | POA: Diagnosis not present

## 2024-01-24 ENCOUNTER — Ambulatory Visit
Admission: RE | Admit: 2024-01-24 | Discharge: 2024-01-24 | Disposition: A | Discharge status: Home / Self Care | Payer: Medicare HMO | Attending: Orthopaedic Surgery | Admitting: Orthopaedic Surgery

## 2024-01-24 ENCOUNTER — Ambulatory Visit
Admission: RE | Admit: 2024-01-24 | Discharge: 2024-01-24 | Disposition: A | Discharge status: Home / Self Care | Payer: Medicare HMO | Source: Ambulatory Visit | Attending: Orthopaedic Surgery | Admitting: Orthopaedic Surgery

## 2024-01-24 ENCOUNTER — Ambulatory Visit: Payer: Medicare HMO

## 2024-01-24 DIAGNOSIS — S72492A Other fracture of lower end of left femur, initial encounter for closed fracture: Secondary | ICD-10-CM

## 2024-01-24 DIAGNOSIS — I5022 Chronic systolic (congestive) heart failure: Secondary | ICD-10-CM | POA: Diagnosis not present

## 2024-01-24 DIAGNOSIS — S72412A Displaced unspecified condyle fracture of lower end of left femur, initial encounter for closed fracture: Secondary | ICD-10-CM | POA: Diagnosis not present

## 2024-01-24 DIAGNOSIS — M1612 Unilateral primary osteoarthritis, left hip: Secondary | ICD-10-CM | POA: Diagnosis not present

## 2024-01-24 DIAGNOSIS — I4821 Permanent atrial fibrillation: Secondary | ICD-10-CM

## 2024-01-24 DIAGNOSIS — S72402D Unspecified fracture of lower end of left femur, subsequent encounter for closed fracture with routine healing: Secondary | ICD-10-CM | POA: Diagnosis not present

## 2024-01-24 DIAGNOSIS — I4892 Unspecified atrial flutter: Secondary | ICD-10-CM | POA: Diagnosis not present

## 2024-01-24 DIAGNOSIS — I272 Pulmonary hypertension, unspecified: Secondary | ICD-10-CM | POA: Diagnosis not present

## 2024-01-24 DIAGNOSIS — I130 Hypertensive heart and chronic kidney disease with heart failure and stage 1 through stage 4 chronic kidney disease, or unspecified chronic kidney disease: Secondary | ICD-10-CM | POA: Diagnosis not present

## 2024-01-24 DIAGNOSIS — J4489 Other specified chronic obstructive pulmonary disease: Secondary | ICD-10-CM | POA: Diagnosis not present

## 2024-01-24 DIAGNOSIS — I442 Atrioventricular block, complete: Secondary | ICD-10-CM | POA: Diagnosis not present

## 2024-01-24 DIAGNOSIS — N189 Chronic kidney disease, unspecified: Secondary | ICD-10-CM | POA: Diagnosis not present

## 2024-01-24 LAB — CUP PACEART REMOTE DEVICE CHECK
Battery Remaining Longevity: 32 mo
Battery Remaining Percentage: 39 %
Battery Voltage: 2.96 V
Date Time Interrogation Session: 20250204023617
Implantable Lead Connection Status: 753985
Implantable Lead Connection Status: 753985
Implantable Lead Implant Date: 20190515
Implantable Lead Implant Date: 20190515
Implantable Lead Location: 753858
Implantable Lead Location: 753860
Implantable Lead Model: 5076
Implantable Pulse Generator Implant Date: 20190515
Lead Channel Impedance Value: 510 Ohm
Lead Channel Impedance Value: 580 Ohm
Lead Channel Pacing Threshold Amplitude: 0.5 V
Lead Channel Pacing Threshold Amplitude: 0.75 V
Lead Channel Pacing Threshold Pulse Width: 0.5 ms
Lead Channel Pacing Threshold Pulse Width: 0.5 ms
Lead Channel Sensing Intrinsic Amplitude: 12 mV
Lead Channel Setting Pacing Amplitude: 2 V
Lead Channel Setting Pacing Amplitude: 2.5 V
Lead Channel Setting Pacing Pulse Width: 0.5 ms
Lead Channel Setting Pacing Pulse Width: 0.5 ms
Lead Channel Setting Sensing Sensitivity: 2 mV
Pulse Gen Model: 3562
Pulse Gen Serial Number: 9427031

## 2024-01-25 ENCOUNTER — Telehealth: Payer: Self-pay

## 2024-01-25 NOTE — Telephone Encounter (Signed)
Scheduled remote reviewed. Normal device function.  1 VHR detection lasting 24 beats.  Within the monitoring period, HF diagnostics have been abnormal. Routing to triage for [facility] > 20 beats. Next remote 91 days. - CS, CVRS

## 2024-01-26 ENCOUNTER — Ambulatory Visit (INDEPENDENT_AMBULATORY_CARE_PROVIDER_SITE_OTHER): Payer: Medicare HMO | Admitting: Orthopaedic Surgery

## 2024-01-26 DIAGNOSIS — S72492A Other fracture of lower end of left femur, initial encounter for closed fracture: Secondary | ICD-10-CM

## 2024-01-26 NOTE — Progress Notes (Signed)
 Post Operative Evaluation    Procedure/Date of Surgery: Left distal femur open reduction internal fixation 1/2  Interval History:    Presents today 1 month status post the above procedure.  Overall he is doing extremely well.  He has not been able to put full weight on the leg.  Range of motion is improving nicely.  Denies any pain in the leg   PMH/PSH/Family History/Social History/Meds/Allergies:    Past Medical History:  Diagnosis Date   Anxiety    Arthritis    Ascending aortic aneurysm (HCC)    a. 11/2018 Echo: Ao root 43mm, Asc Ao 41mm; b. 02/2021 Echo: Ao root 47mm; c. 04/2021 CT chest: Asc Ao 4.5cm.   Asthma    Bradycardia    CAD in native artery    a. LHC 09/2015: 40% pCx, 35% mRCA.   Chronic HFrEF (heart failure with reduced ejection fraction) (HCC)    COPD (chronic obstructive pulmonary disease) (HCC)    Dilation of intestine 01/2015   Fibromyalgia    Frequent headaches    GERD (gastroesophageal reflux disease)    History of blood clots    eye    History of hiatal hernia    History of kidney stones    History of rheumatic fever    Hypertension    NICM (nonischemic cardiomyopathy) (HCC)    a. 07/2017 Echo: EF reduced to 40-45%; b. 07/2018 Echo: EF 25-30%; c. 11/2018 Echo: EF 30-35%; d. 02/2021 Echo: EF 45-50%, septal and apical septal HK. Mild MR. Sev dil LA. Mod dil RA. Ao root 47mm.   Permanent atrial fibrillation (HCC)    a. s/p TEE/DCCV 07/2015. b. H/o bleeding on Coumadin  when INR >5, changed to Eliquis -subsequently discontinued; c. 04/2018 s/p SJM 3562 Quadra Allure MP DC PPM & AVN ablation.   S/P Minimally invasive maze operation for atrial fibrillation 10/22/2015   Complete bilateral atrial lesion set using cryothermy and bipolar radiofrequency ablation with clipping of LA appendage via right mini thoracotomy approach   S/P minimally invasive mitral valve repair 10/22/2015   Complex valvuloplasty including triangular resection of  posterior leaflet, artificial Gore-tex neochord placement x6 and 38 mm Sorin Memo 3D Rechord ring annuloplasty via right minithoracotomy approach   Severe mitral regurgitation s/p MVR    a. s/p MV repair 10/2015; b. 11/2018 Echo: Mild MS (mean grad ); d. 02/2021 Echo: Mild MR. Mean grad .   Sleep apnea    no longer uses cpap and doesn't use O2 at home   Stroke [facility])     no vision in right, on occasion sees a pinpoint    Thyroid  disorder    TIA (transient ischemic attack)    Tobacco abuse    Past Surgical History:  Procedure Laterality Date   ANKLE SURGERY     AV NODE ABLATION N/A 05/04/2018   Procedure: AV NODE ABLATION;  Surgeon: Kelsie Agent, MD;  Location: [facility] INVASIVE CV LAB;  Service: Cardiovascular;  Laterality: N/A;   BIV PACEMAKER INSERTION CRT-P N/A 05/03/2018   Procedure: BIV PACEMAKER INSERTION CRT-P;  Surgeon: Fernande Elspeth BROCKS, MD;  Location: [facility] INVASIVE CV LAB;  Service: Cardiovascular;  Laterality: N/A;   CARDIAC CATHETERIZATION N/A 10/03/2015   Procedure: Right and Left Heart Cath and Coronary Angiography;  Surgeon: Evalene JINNY Lunger, MD;  Location: [facility] INVASIVE CV LAB;  Service: Cardiovascular;  Laterality: N/A;   COLONOSCOPY     ELECTROPHYSIOLOGIC STUDY N/A 08/18/2015   Procedure: CARDIOVERSION;  Surgeon: Deatrice DELENA Cage, MD;  Location: [facility] ORS;  Service: Cardiovascular;  Laterality: N/A;   MASS EXCISION N/A 03/04/2021   Procedure: EXCISION INNER NASAL CANCER;  Surgeon: Ethyl Lonni BRAVO, MD;  Location: [facility] OR;  Service: ENT;  Laterality: N/A;   MINIMALLY INVASIVE MAZE PROCEDURE N/A 10/22/2015   Procedure: MINIMALLY INVASIVE MAZE PROCEDURE;  Surgeon: Sudie VEAR Laine, MD;  Location: [facility] OR;  Service: Open Heart Surgery;  Laterality: N/A;   MITRAL VALVE REPAIR Right 10/22/2015   Procedure: MINIMALLY INVASIVE MITRAL VALVE REPAIR (MVR);  Surgeon: Sudie VEAR [place], MD;  [facility] OR;  Service: Open Heart Surgery;  Laterality: Right;   ORIF FEMUR FRACTURE Left 12/22/2023    Procedure: OPEN REDUCTION INTERNAL FIXATION (ORIF) DISTAL FEMUR FRACTURE;  Surgeon: Genelle Standing, MD;  Location: [facility] ORS;  Service: Orthopedics;  Laterality: Left;  NOT HIS CARD Zimmer distal plate requested- rep aware   SINUS EXPLORATION     SKIN FULL THICKNESS GRAFT N/A 03/04/2021   Procedure: SKIN GRAFT FULL THICKNESS;  Surgeon: Ethyl Lonni BRAVO, MD;  Location: [facility] OR;  Service: ENT;  Laterality: N/A;   TEE WITHOUT CARDIOVERSION N/A 08/18/2015   Procedure: TRANSESOPHAGEAL ECHOCARDIOGRAM (TEE);  Surgeon: Deatrice DELENA Cage, MD;  Location: [facility] ORS;  Service: Cardiovascular;  Laterality: N/A;   TEE WITHOUT CARDIOVERSION N/A 10/22/2015   Procedure: TRANSESOPHAGEAL ECHOCARDIOGRAM (TEE);  Surgeon: Sudie VEAR [place], MD;  Location: [facility] OR;  Service: Open Heart Surgery;  Laterality: N/A;   Social History   Socioeconomic History   Marital status: Married    Spouse name: [name]   Number of children: 3   Years of education: GED   Highest education level: GED or equivalent  Occupational History   Not on file  Tobacco Use   Smoking status: Former    Average packs/day: 0.5 packs/day for 65.0 years (32.5 ttl pk-yrs)    Types: Cigarettes    Start date: 1960   Smokeless tobacco: Never   Tobacco comments:    Resumed smoking, after quit 01/2018    Stopped smoking December 21, 2023  Vaping Use   Vaping status: Never Used  Substance and Sexual Activity   Alcohol use: Not Currently    Alcohol/week: 1.0 standard drink of alcohol    Types: 1 Standard drinks or equivalent per week    Comment: occasionally drinks a margarita   Drug use: No   Sexual activity: Not on file  Other Topics Concern   Not on file  Social History Narrative   Right handed    2-3 cups coffee per day         Social Drivers of Health   Financial Resource Strain: Low Risk  (01/12/2024)   Overall Financial Resource Strain (CARDIA)    Difficulty of Paying Living Expenses: Not hard at all  Recent Concern: Financial  Resource Strain - Medium Risk (10/26/2023)   Overall Financial Resource Strain (CARDIA)    Difficulty of Paying Living Expenses: Somewhat hard  Food Insecurity: Food Insecurity Present (01/12/2024)   Hunger Vital Sign    Worried About Running Out of Food in the Last Year: Sometimes true    Ran Out of Food in the Last Year: Never true  Transportation Needs: No Transportation Needs (01/12/2024)   PRAPARE - Administrator, Civil Service (Medical): No    Lack of Transportation (Non-Medical): No  Physical  Activity: Inactive (01/12/2024)   Exercise Vital Sign    Days of Exercise per Week: 0 days    Minutes of Exercise per Session: 0 min  Stress: No Stress Concern Present (01/12/2024)   [employer] of Occupational Health - Occupational Stress Questionnaire    Feeling of Stress : Only a little  Social Connections: Moderately Isolated (01/12/2024)   Social Connection and Isolation Panel [NHANES]    Frequency of Communication with Friends and Family: More than three times a week    Frequency of Social Gatherings with Friends and Family: More than three times a week    Attends Religious Services: Never    Database Administrator or Organizations: No    Attends Engineer, Structural: Never    Marital Status: Married   Family History  Problem Relation Age of Onset   Stroke Mother    Irregular heart beat Mother    Heart murmur Brother    Pulmonary embolism Brother    Hypertension Other    Pulmonary embolism Maternal Uncle    Allergies  Allergen Reactions   Codeine Nausea Only   Digoxin  And Related     SOB, bad dreams   Macrodantin [Nitrofurantoin Macrocrystal] Rash   Morphine  And Codeine Rash   Current Outpatient Medications  Medication Sig Dispense Refill   albuterol  (VENTOLIN  HFA) 108 (90 Base) MCG/ACT inhaler Inhale 2 puffs into the lungs every 6 (six) hours as needed for wheezing or shortness of breath. 8 g 0   aspirin  325 MG tablet Take 1 tablet (325 mg total)  by mouth daily. (Patient not taking: Reported on 01/10/2024) 90 tablet 0   furosemide  (LASIX ) 40 MG tablet Take 40 mg by mouth daily. As needed     magic mouthwash w/lidocaine  SOLN Swish, gargle, and spit one to two teaspoonfuls every six hours as needed. Shake well before using. 120 mL 0   oxycodone  (OXY-IR) 5 MG capsule Take 5 mg by mouth every 6 (six) hours as needed. As needed     traZODone  (DESYREL ) 50 MG tablet Take 2 tablets (100 mg total) by mouth at bedtime.     No current facility-administered medications for this visit.   No results found.  Review of Systems:   A ROS was performed including pertinent positives and negatives as documented in the HPI.   Musculoskeletal Exam:    There were no vitals taken for this visit.  Left knee incision is well-healed without erythema or drainage.  Range of motion is from 0-90 without pain.  Good quadriceps tone and bulk  Imaging:    2 views left femur: Status post open reduction internal fixation distal femur without evidence of complication  I personally reviewed and interpreted the radiographs.   Assessment:   1 month status post left distal femur open reduction internal fixation overall doing extremely well.  This time he will continue to weight-bear as tolerated.  He may discontinue his brace as well.  I will plan to see him back in 6 weeks for reassessment  Plan :    -Return to clinic in 4 to 6 weeks for reassessment      I personally saw and evaluated the patient, and participated in the management and treatment plan.  Elspeth Parker, MD Attending Physician, Orthopedic Surgery  This document was dictated using Dragon voice recognition software. A reasonable attempt at proof reading has been made to minimize errors.

## 2024-01-27 ENCOUNTER — Other Ambulatory Visit: Payer: Self-pay

## 2024-01-27 ENCOUNTER — Telehealth: Payer: Self-pay | Admitting: Orthopaedic Surgery

## 2024-01-27 ENCOUNTER — Other Ambulatory Visit: Payer: Self-pay | Admitting: Family Medicine

## 2024-01-27 ENCOUNTER — Telehealth: Payer: Self-pay | Admitting: Family Medicine

## 2024-01-27 ENCOUNTER — Emergency Department
Admission: EM | Admit: 2024-01-27 | Discharge: 2024-01-27 | Discharge status: Against Medical Advice | Payer: Medicare HMO | Attending: Emergency Medicine | Admitting: Emergency Medicine

## 2024-01-27 ENCOUNTER — Emergency Department: Payer: Medicare HMO

## 2024-01-27 ENCOUNTER — Ambulatory Visit: Payer: Self-pay

## 2024-01-27 DIAGNOSIS — I4892 Unspecified atrial flutter: Secondary | ICD-10-CM | POA: Diagnosis not present

## 2024-01-27 DIAGNOSIS — R0602 Shortness of breath: Secondary | ICD-10-CM | POA: Diagnosis not present

## 2024-01-27 DIAGNOSIS — R079 Chest pain, unspecified: Secondary | ICD-10-CM | POA: Diagnosis not present

## 2024-01-27 DIAGNOSIS — Z5321 Procedure and treatment not carried out due to patient leaving prior to being seen by health care provider: Secondary | ICD-10-CM | POA: Insufficient documentation

## 2024-01-27 DIAGNOSIS — S72402D Unspecified fracture of lower end of left femur, subsequent encounter for closed fracture with routine healing: Secondary | ICD-10-CM | POA: Diagnosis not present

## 2024-01-27 DIAGNOSIS — I5022 Chronic systolic (congestive) heart failure: Secondary | ICD-10-CM | POA: Diagnosis not present

## 2024-01-27 DIAGNOSIS — F5104 Psychophysiologic insomnia: Secondary | ICD-10-CM

## 2024-01-27 DIAGNOSIS — Z7982 Long term (current) use of aspirin: Secondary | ICD-10-CM | POA: Diagnosis not present

## 2024-01-27 DIAGNOSIS — N189 Chronic kidney disease, unspecified: Secondary | ICD-10-CM | POA: Diagnosis not present

## 2024-01-27 DIAGNOSIS — R519 Headache, unspecified: Secondary | ICD-10-CM | POA: Diagnosis not present

## 2024-01-27 DIAGNOSIS — Z950 Presence of cardiac pacemaker: Secondary | ICD-10-CM | POA: Insufficient documentation

## 2024-01-27 DIAGNOSIS — J4489 Other specified chronic obstructive pulmonary disease: Secondary | ICD-10-CM | POA: Diagnosis not present

## 2024-01-27 DIAGNOSIS — I272 Pulmonary hypertension, unspecified: Secondary | ICD-10-CM | POA: Diagnosis not present

## 2024-01-27 DIAGNOSIS — I130 Hypertensive heart and chronic kidney disease with heart failure and stage 1 through stage 4 chronic kidney disease, or unspecified chronic kidney disease: Secondary | ICD-10-CM | POA: Diagnosis not present

## 2024-01-27 DIAGNOSIS — F419 Anxiety disorder, unspecified: Secondary | ICD-10-CM

## 2024-01-27 DIAGNOSIS — I4821 Permanent atrial fibrillation: Secondary | ICD-10-CM | POA: Diagnosis not present

## 2024-01-27 DIAGNOSIS — I442 Atrioventricular block, complete: Secondary | ICD-10-CM | POA: Diagnosis not present

## 2024-01-27 DIAGNOSIS — R069 Unspecified abnormalities of breathing: Secondary | ICD-10-CM | POA: Diagnosis not present

## 2024-01-27 LAB — BASIC METABOLIC PANEL
Anion gap: 13 (ref 5–15)
BUN: 8 mg/dL (ref 8–23)
CO2: 23 mmol/L (ref 22–32)
Calcium: 9.1 mg/dL (ref 8.9–10.3)
Chloride: 101 mmol/L (ref 98–111)
Creatinine, Ser: 1.12 mg/dL (ref 0.61–1.24)
GFR, Estimated: >60 mL/min (ref >60)
Glucose, Bld: 137 mg/dL — ABNORMAL HIGH (ref 70–99)
Potassium: 3.2 mmol/L — ABNORMAL LOW (ref 3.5–5.1)
Sodium: 137 mmol/L (ref 135–145)

## 2024-01-27 LAB — CBC
HCT: 41 % (ref 39.0–52.0)
Hemoglobin: 13.3 g/dL (ref 13.0–17.0)
MCH: 31.1 pg (ref 26.0–34.0)
MCHC: 32.4 g/dL (ref 30.0–36.0)
MCV: 96 fL (ref 80.0–100.0)
Platelets: 162 10*3/uL (ref 150–400)
RBC: 4.27 MIL/uL (ref 4.22–5.81)
RDW: 14 % (ref 11.5–15.5)
WBC: 5.9 10*3/uL (ref 4.0–10.5)
nRBC: 0 % (ref 0.0–0.2)

## 2024-01-27 LAB — TROPONIN I (HIGH SENSITIVITY): Troponin I (High Sensitivity): 12 ng/L (ref <18)

## 2024-01-27 NOTE — Telephone Encounter (Signed)
 Medication Refill -  Most Recent Primary Care Visit:  Provider: EDMAN MARSA PARAS  Department: [id number]  Visit Type: HOSPITAL FU  Date: 01/10/2024  Medication: traZODone  (DESYREL ) 50 MG tablet   Has the patient contacted their pharmacy? No (Agent: If no, request that the patient contact the pharmacy for the refill. If patient does not wish to contact the pharmacy document the reason why and proceed with request.) (Agent: If yes, when and what did the pharmacy advise?)  Is this the correct pharmacy for this prescription? Yes If no, delete pharmacy and type the correct one.  This is the patient's preferred pharmacy:  [id number] - [place], [place] - [address] [place] [address] Phone: [phone] Fax: [phone]   Has the prescription been filled recently? Yes  Is the patient out of the medication? Yes  Has the patient been seen for an appointment in the last year OR does the patient have an upcoming appointment? Yes  Can we respond through MyChart? Yes  Agent: Please be advised that Rx refills may take up to 3 business days. We ask that you follow-up with your pharmacy.

## 2024-01-27 NOTE — ED Triage Notes (Signed)
 Pt from home via EMS, CP and shortness of breath started about 2hrs ago approximately. Bad Headache, Recently discharged from hospital for a knee surgery aroun 12/29/2023.  Pace maker, has 4 baby aspirin . 138/54 100% 20 Lt/AC. Using inhaler Hx of COPD.   Pt talks in complete sentences. Pt reports his right eye has it covered due to headache.

## 2024-01-27 NOTE — ED Notes (Signed)
 THis RN removed IV place dby EMS, RN flushed line and it was infiltrated.

## 2024-01-27 NOTE — Telephone Encounter (Signed)
  Chief Complaint: Refill of trazodone  and needs a day time medication for anxiety as well Symptoms: anxious Frequency: ongoing Pertinent Negatives: Patient denies  Disposition: [] ED /[] Urgent Care (no appt availability in office) / [] Appointment(In office/virtual)/ []  [place] Virtual Care/ [] Home Care/ [] Refused Recommended Disposition /[] [place] Mobile Bus/ [x]  Follow-up with PCP Additional Notes: Call from pt's wife.  She states that pt will be out of Trazodone  tonight as the pt is now taking 2 at night not just one. She would like this refilled. Additionally wife states that pt is very anxious during the day and she would like provider to prescribe a medication to help to be less anxious, and allow his mind to calm down. She would like this done today. Please advise.    Reason for Disposition  Prescription request for new medicine (not a refill)  Answer Assessment - Initial Assessment Questions 1. DRUG NAME: What medicine do you need to have refilled?     Trazodone  and another medication 2. REFILLS REMAINING: How many refills are remaining? (Note: The label on the medicine or pill bottle will show how many refills are remaining. If there are no refills remaining, then a renewal may be needed.)     none 4. PRESCRIBING HCP: Who prescribed it? Reason: If prescribed by specialist, call should be referred to that group.     Dr. Edman 5. SYMPTOMS: Do you have any symptoms?     anxiety  Protocols used: Medication Refill and Renewal Call-A-AH

## 2024-01-27 NOTE — Telephone Encounter (Signed)
 Amy, PT w/ [facility] called asking for any PT restrictions on ROM exercises.   Call back [phone]

## 2024-01-28 LAB — BRAIN NATRIURETIC PEPTIDE: B Natriuretic Peptide: 52.4 pg/mL (ref 0.0–100.0)

## 2024-01-28 LAB — TROPONIN I (HIGH SENSITIVITY): Troponin I (High Sensitivity): 13 ng/L (ref <18)

## 2024-01-30 DIAGNOSIS — I130 Hypertensive heart and chronic kidney disease with heart failure and stage 1 through stage 4 chronic kidney disease, or unspecified chronic kidney disease: Secondary | ICD-10-CM | POA: Diagnosis not present

## 2024-01-30 DIAGNOSIS — I4892 Unspecified atrial flutter: Secondary | ICD-10-CM | POA: Diagnosis not present

## 2024-01-30 DIAGNOSIS — S72402D Unspecified fracture of lower end of left femur, subsequent encounter for closed fracture with routine healing: Secondary | ICD-10-CM | POA: Diagnosis not present

## 2024-01-30 DIAGNOSIS — I272 Pulmonary hypertension, unspecified: Secondary | ICD-10-CM | POA: Diagnosis not present

## 2024-01-30 DIAGNOSIS — I4821 Permanent atrial fibrillation: Secondary | ICD-10-CM | POA: Diagnosis not present

## 2024-01-30 DIAGNOSIS — N189 Chronic kidney disease, unspecified: Secondary | ICD-10-CM | POA: Diagnosis not present

## 2024-01-30 DIAGNOSIS — I5022 Chronic systolic (congestive) heart failure: Secondary | ICD-10-CM | POA: Diagnosis not present

## 2024-01-30 DIAGNOSIS — I442 Atrioventricular block, complete: Secondary | ICD-10-CM | POA: Diagnosis not present

## 2024-01-30 DIAGNOSIS — J4489 Other specified chronic obstructive pulmonary disease: Secondary | ICD-10-CM | POA: Diagnosis not present

## 2024-01-30 MED ORDER — ALPRAZOLAM 0.25 MG PO TABS: 0.2500 mg | ORAL_TABLET | Freq: Two times a day (BID) | ORAL | 0 refills | Status: DC | PRN

## 2024-01-30 MED ORDER — TRAZODONE HCL 50 MG PO TABS: 100.0000 mg | ORAL_TABLET | Freq: Every day | ORAL | 0 refills | Status: DC

## 2024-01-30 MED ORDER — TRAZODONE HCL 100 MG PO TABS: 100.0000 mg | ORAL_TABLET | Freq: Every day | ORAL | 1 refills | Status: DC

## 2024-01-30 NOTE — Telephone Encounter (Signed)
 Can you contact patient to discuss the anxiety medication? I would need more information before ordering it.  I can refill Trazodone  right now. Do they prefer 30 day or 90 day and [facility] or [facility]?  I will change the dose to Trazodone  100mg  tab instead of 50mg , so he can take one whole 100mg  tab nightly.  Regarding the anxiety - I will need to know which med he prefers. He has been on Alprazolam  (Xanax ) for years in the past and also has been ordered Buspar  for anxiety. Buspar  is more mild and not a controlled and it is very safe, both medicines have potential for dizzy or lightheaded and other common mild side effects. However the Xanax  / Alprazolam  or cousins (Clonzepam or Ativan  Lorazepam ) can have risk of dependence but they are stronger. It depends on how often he plans to use it and if he is comfortable returning to those medicines again.  Let me know  Domingo Friend, DO [facility] 01/30/2024, 8:57 AM

## 2024-01-30 NOTE — Telephone Encounter (Signed)
 Wife notified prescriptions were sent in and updated her on instructions.

## 2024-01-30 NOTE — Telephone Encounter (Signed)
 Rx Trazodone  100mg  sent and Alprazolam  0.25mg  TWICE A DAY AS NEEDED anxiety  Domingo Friend, DO [facility] 01/30/2024, 11:35 AM

## 2024-01-30 NOTE — Telephone Encounter (Signed)
 Requested Prescriptions  Pending Prescriptions Disp Refills   traZODone  (DESYREL ) 50 MG tablet 60 tablet 0    Sig: Take 2 tablets (100 mg total) by mouth at bedtime.     Psychiatry: Antidepressants - Serotonin Modulator Passed - 01/30/2024  7:50 AM      Passed - Completed PHQ-2 or PHQ-9 in the last 360 days      Passed - Valid encounter within last 6 months    Recent Outpatient Visits           2 weeks ago Left leg pain   [facility] Raina Bunting, DO   9 months ago Viral sinusitis   [facility] Fleming, Rankin Buzzard, NP   1 year ago Acute non-recurrent pansinusitis   [facility]ush, Rankin Buzzard, NP   1 year ago Vertigo   [facility] Raina Bunting, DO   1 year ago Hordeolum externum of right lower eyelid   [facility] Raina Bunting, DO       Future Appointments             In 4 weeks Gollan, Deadra Everts, MD [facility]

## 2024-01-31 NOTE — Progress Notes (Deleted)
  Electrophysiology Office Follow up Visit Note:    Date:  01/31/2024   ID:  Michael Delacruz, DOB 12-05-1941, MRN [id number]  PCP:  Smitty Cords, DO  [facility] Cardiologist:  Julien Nordmann, MD  [facility] Electrophysiologist:  Lanier Prude, MD    Interval History:     Michael Delacruz is a 83 y.o. male who presents for a follow up visit  The patient is followed by Dr. Graciela Husbands in the outpatient setting.  He last saw Dr. Graciela Husbands October 25, 2023.  He has a history of permanent atrial fibrillation.  He has been off anticoagulation for some time given concerns with bleeding.  He has a CRT-P that is in place and has had an AV junction ablation.  His medical history is complex and includes chronic systolic heart failure thought to be tachycardia related, COPD, fibromyalgia, retinal artery thrombosis, hypertension, mitral valve repair with maze and left atrial appendage clip.        Past medical, surgical, social and family history were reviewed.  ROS:   Please see the history of present illness.    All other systems reviewed and are negative.  EKGs/Labs/Other Studies Reviewed:    The following studies were reviewed today:  December 22, 2023 echo EF 50 to 55% RV normal Moderately dilated left atrium Mildly dilated right atrium Trivial MR No mitral stenosis  Apr 25, 2022 CT angio chest abdomen pelvis  Left atrial appendage clip in good position without evidence of flow into the appendage.      Physical Exam:    VS:  There were no vitals taken for this visit.    Wt Readings from Last 3 Encounters:  01/27/24 175 lb (79.4 kg)  12/22/23 176 lb 12.9 oz (80.2 kg)  10/25/23 183 lb 3.2 oz (83.1 kg)     GEN: no distress CARD: RRR, No MRG.  CIED pocket well-healed RESP: No IWOB. CTAB.      ASSESSMENT:    1. HFrEF (heart failure with reduced ejection fraction) (HCC)   2. Permanent atrial fibrillation (HCC)   3. S/P left atrial appendage ligation    4. Biventricular cardiac pacemaker in situ    PLAN:    In order of problems listed above:  #Permanent atrial fibrillation #Post left atrial appendage ligation. The patient presents to discuss alternatives to anticoagulation.  I have reviewed his medical records including his operative note from his previous mitral valve repair.  During the procedure he had a left atrial appendage ligation.  A CT scan performed after that procedure demonstrated no flow of contrast into the residual left atrial appendage indicating a successful closure.  I discussed how this impacts his risk of stroke.  We discussed available data supporting left atrial appendage ligation as a stroke risk mitigation strategy.  After an extensive discussion, the patient is elected to discontinue anticoagulation and continue with aspirin 81 mg mouth once daily.***  #Chronic systolic heart failure #CRT-P in situ #Post AV nodal ablation The patient has a CRT-P in place.  The device is functioning appropriately. His biventricular pacing percentage is 97% on the most recent interrogation. NYHA class II.  Warm dry on exam.    Signed, Steffanie Dunn, MD, [facility] 01/31/2024 5:20 PM    Electrophysiology [facility]

## 2024-02-01 ENCOUNTER — Ambulatory Visit: Payer: Medicare HMO | Attending: Cardiology | Admitting: Cardiology

## 2024-02-01 DIAGNOSIS — J4489 Other specified chronic obstructive pulmonary disease: Secondary | ICD-10-CM | POA: Diagnosis not present

## 2024-02-01 DIAGNOSIS — Z9889 Other specified postprocedural states: Secondary | ICD-10-CM

## 2024-02-01 DIAGNOSIS — I5022 Chronic systolic (congestive) heart failure: Secondary | ICD-10-CM | POA: Diagnosis not present

## 2024-02-01 DIAGNOSIS — I4892 Unspecified atrial flutter: Secondary | ICD-10-CM | POA: Diagnosis not present

## 2024-02-01 DIAGNOSIS — N189 Chronic kidney disease, unspecified: Secondary | ICD-10-CM | POA: Diagnosis not present

## 2024-02-01 DIAGNOSIS — Z950 Presence of cardiac pacemaker: Secondary | ICD-10-CM

## 2024-02-01 DIAGNOSIS — I272 Pulmonary hypertension, unspecified: Secondary | ICD-10-CM | POA: Diagnosis not present

## 2024-02-01 DIAGNOSIS — I130 Hypertensive heart and chronic kidney disease with heart failure and stage 1 through stage 4 chronic kidney disease, or unspecified chronic kidney disease: Secondary | ICD-10-CM | POA: Diagnosis not present

## 2024-02-01 DIAGNOSIS — I5020 Unspecified systolic (congestive) heart failure: Secondary | ICD-10-CM

## 2024-02-01 DIAGNOSIS — I4821 Permanent atrial fibrillation: Secondary | ICD-10-CM | POA: Diagnosis not present

## 2024-02-01 DIAGNOSIS — I442 Atrioventricular block, complete: Secondary | ICD-10-CM | POA: Diagnosis not present

## 2024-02-01 DIAGNOSIS — S72402D Unspecified fracture of lower end of left femur, subsequent encounter for closed fracture with routine healing: Secondary | ICD-10-CM | POA: Diagnosis not present

## 2024-02-03 ENCOUNTER — Telehealth: Payer: Self-pay

## 2024-02-03 ENCOUNTER — Ambulatory Visit: Payer: Medicare HMO

## 2024-02-03 DIAGNOSIS — J4489 Other specified chronic obstructive pulmonary disease: Secondary | ICD-10-CM | POA: Diagnosis not present

## 2024-02-03 DIAGNOSIS — Z0000 Encounter for general adult medical examination without abnormal findings: Secondary | ICD-10-CM | POA: Diagnosis not present

## 2024-02-03 DIAGNOSIS — I4892 Unspecified atrial flutter: Secondary | ICD-10-CM | POA: Diagnosis not present

## 2024-02-03 DIAGNOSIS — Z599 Problem related to housing and economic circumstances, unspecified: Secondary | ICD-10-CM

## 2024-02-03 DIAGNOSIS — N189 Chronic kidney disease, unspecified: Secondary | ICD-10-CM | POA: Diagnosis not present

## 2024-02-03 DIAGNOSIS — S72402D Unspecified fracture of lower end of left femur, subsequent encounter for closed fracture with routine healing: Secondary | ICD-10-CM | POA: Diagnosis not present

## 2024-02-03 DIAGNOSIS — I272 Pulmonary hypertension, unspecified: Secondary | ICD-10-CM | POA: Diagnosis not present

## 2024-02-03 DIAGNOSIS — I5022 Chronic systolic (congestive) heart failure: Secondary | ICD-10-CM | POA: Diagnosis not present

## 2024-02-03 DIAGNOSIS — I442 Atrioventricular block, complete: Secondary | ICD-10-CM | POA: Diagnosis not present

## 2024-02-03 DIAGNOSIS — I4821 Permanent atrial fibrillation: Secondary | ICD-10-CM | POA: Diagnosis not present

## 2024-02-03 DIAGNOSIS — I130 Hypertensive heart and chronic kidney disease with heart failure and stage 1 through stage 4 chronic kidney disease, or unspecified chronic kidney disease: Secondary | ICD-10-CM | POA: Diagnosis not present

## 2024-02-03 NOTE — Progress Notes (Signed)
Subjective:   Michael Delacruz is a 83 y.o. male who presents for Medicare Annual/Subsequent preventive examination.  Visit Complete: Virtual I connected with  Michael Delacruz on 02/03/24 by a audio enabled telemedicine application and verified that I am speaking with the correct person using two identifiers.  This patient declined Interactive audio and Acupuncturist. Therefore the visit was completed with audio only.   Patient Location: Home  Provider Location: Office/Clinic  I discussed the limitations of evaluation and management by telemedicine. The patient expressed understanding and agreed to proceed.  Vital Signs: Because this visit was a virtual/telehealth visit, some criteria may be missing or patient reported. Any vitals not documented were not able to be obtained and vitals that have been documented are patient reported.  Cardiac Risk Factors include: advanced age (>35men, >33 women);smoking/ tobacco exposure;male gender;dyslipidemia;sedentary lifestyle     Objective:    Today's Vitals   02/03/24 0816  PainSc: 3    There is no height or weight on file to calculate BMI.     02/03/2024    8:24 AM 01/27/2024    9:11 PM 12/21/2023    9:44 PM 12/21/2023    9:41 PM 01/28/2023    8:24 AM 04/25/2022    3:41 PM 01/01/2022    2:17 PM  Advanced Directives  Does Patient Have a Medical Advance Directive? No No  No No No No  Would patient like information on creating a medical advance directive? No - Patient declined  No - Patient declined  No - Patient declined No - Patient declined No - Patient declined    Current Medications (verified) Outpatient Encounter Medications as of 02/03/2024  Medication Sig   albuterol (VENTOLIN HFA) 108 (90 Base) MCG/ACT inhaler Inhale 2 puffs into the lungs every 6 (six) hours as needed for wheezing or shortness of breath.   ALPRAZolam (XANAX) 0.25 MG tablet Take 1 tablet (0.25 mg total) by mouth 2 (two) times daily as needed for anxiety.    furosemide (LASIX) 40 MG tablet Take 40 mg by mouth daily. As needed   magic mouthwash w/lidocaine SOLN Swish, gargle, and spit one to two teaspoonfuls every six hours as needed. Shake well before using.   traZODone (DESYREL) 100 MG tablet Take 1 tablet (100 mg total) by mouth at bedtime.   aspirin 325 MG tablet Take 1 tablet (325 mg total) by mouth daily. (Patient not taking: Reported on 02/03/2024)   oxycodone (OXY-IR) 5 MG capsule Take 5 mg by mouth every 6 (six) hours as needed. As needed (Patient not taking: Reported on 02/03/2024)   No facility-administered encounter medications on file as of 02/03/2024.    Allergies (verified) Codeine, Digoxin and related, Macrodantin [nitrofurantoin macrocrystal], and Morphine and codeine   History: Past Medical History:  Diagnosis Date   Anxiety    Arthritis    Ascending aortic aneurysm (HCC)    a. 11/2018 Echo: Ao root 43mm, Asc Ao 41mm; b. 02/2021 Echo: Ao root 47mm; c. 04/2021 CT chest: Asc Ao 4.5cm.   Asthma    Bradycardia    CAD in native artery    a. LHC 09/2015: 40% pCx, 35% mRCA.   Chronic HFrEF (heart failure with reduced ejection fraction) (HCC)    COPD (chronic obstructive pulmonary disease) (HCC)    Dilation of intestine 01/2015   Fibromyalgia    Frequent headaches    GERD (gastroesophageal reflux disease)    History of blood clots    eye    History  of hiatal hernia    History of kidney stones    History of rheumatic fever    Hypertension    NICM (nonischemic cardiomyopathy) (HCC)    a. 07/2017 Echo: EF reduced to 40-45%; b. 07/2018 Echo: EF 25-30%; c. 11/2018 Echo: EF 30-35%; d. 02/2021 Echo: EF 45-50%, septal and apical septal HK. Mild MR. Sev dil LA. Mod dil RA. Ao root 47mm.   Permanent atrial fibrillation (HCC)    a. s/p TEE/DCCV 07/2015. b. H/o bleeding on Coumadin when INR >5, changed to Eliquis-subsequently discontinued; c. 04/2018 s/p SJM 3562 Quadra Allure MP DC PPM & AVN ablation.   S/P Minimally invasive maze operation  for atrial fibrillation 10/22/2015   Complete bilateral atrial lesion set using cryothermy and bipolar radiofrequency ablation with clipping of LA appendage via right mini thoracotomy approach   S/P minimally invasive mitral valve repair 10/22/2015   Complex valvuloplasty including triangular resection of posterior leaflet, artificial Gore-tex neochord placement x6 and 38 mm Sorin Memo 3D Rechord ring annuloplasty via right minithoracotomy approach   Severe mitral regurgitation s/p MVR    a. s/p MV repair 10/2015; b. 11/2018 Echo: Mild MS (mean grad ); d. 02/2021 Echo: Mild MR. Mean grad .   Sleep apnea    no longer uses cpap and doesn't use O2 at home   Stroke [facility])     no vision in right, on occasion sees a pinpoint    Thyroid disorder    TIA (transient ischemic attack)    Tobacco abuse    Past Surgical History:  Procedure Laterality Date   ANKLE SURGERY     AV NODE ABLATION N/A 05/04/2018   Procedure: AV NODE ABLATION;  Surgeon: Hillis Range, MD;  Location: [facility] INVASIVE CV LAB;  Service: Cardiovascular;  Laterality: N/A;   BIV PACEMAKER INSERTION CRT-P N/A 05/03/2018   Procedure: BIV PACEMAKER INSERTION CRT-P;  Surgeon: Duke Salvia, MD;  Location: [facility] INVASIVE CV LAB;  Service: Cardiovascular;  Laterality: N/A;   CARDIAC CATHETERIZATION N/A 10/03/2015   Procedure: Right and Left Heart Cath and Coronary Angiography;  Surgeon: Antonieta Iba, MD;  Location: [facility] INVASIVE CV LAB;  Service: Cardiovascular;  Laterality: N/A;   COLONOSCOPY     ELECTROPHYSIOLOGIC STUDY N/A 08/18/2015   Procedure: CARDIOVERSION;  Surgeon: Iran Ouch, MD;  Location: [facility] ORS;  Service: Cardiovascular;  Laterality: N/A;   MASS EXCISION N/A 03/04/2021   Procedure: EXCISION INNER NASAL CANCER;  Surgeon: Drema Halon, MD;  Location: [facility] OR;  Service: ENT;  Laterality: N/A;   MINIMALLY INVASIVE MAZE PROCEDURE N/A 10/22/2015   Procedure: MINIMALLY INVASIVE MAZE PROCEDURE;  Surgeon: Purcell Nails, MD;  Location: [facility] OR;  Service: Open Heart Surgery;  Laterality: N/A;   MITRAL VALVE REPAIR Right 10/22/2015   Procedure: MINIMALLY INVASIVE MITRAL VALVE REPAIR (MVR);  Surgeon: Purcell Nails, MD;  Location: [facility];  Service: Open Heart Surgery;  Laterality: Right;   ORIF FEMUR FRACTURE Left 12/22/2023   Procedure: OPEN REDUCTION INTERNAL FIXATION (ORIF) DISTAL FEMUR FRACTURE;  Surgeon: Huel Cote, MD;  Location: [facility] ORS;  Service: Orthopedics;  Laterality: Left;  NOT HIS CARD Zimmer distal plate requested- rep aware   SINUS EXPLORATION     SKIN FULL THICKNESS GRAFT N/A 03/04/2021   Procedure: SKIN GRAFT FULL THICKNESS;  Surgeon: Drema Halon, MD;  Location: [facility] OR;  Service: ENT;  Laterality: N/A;   TEE WITHOUT CARDIOVERSION N/A 08/18/2015   Procedure: TRANSESOPHAGEAL ECHOCARDIOGRAM (TEE);  Surgeon: Chelsea Aus  Kirke Corin, MD;  Location: [facility] ORS;  Service: Cardiovascular;  Laterality: N/A;   TEE WITHOUT CARDIOVERSION N/A 10/22/2015   Procedure: TRANSESOPHAGEAL ECHOCARDIOGRAM (TEE);  Surgeon: Purcell Nails, MD;  Location: [facility] OR;  Service: Open Heart Surgery;  Laterality: N/A;   Family History  Problem Relation Age of Onset   Stroke Mother    Irregular heart beat Mother    Heart murmur Brother    Pulmonary embolism Brother    Hypertension Other    Pulmonary embolism Maternal Uncle    Social History   Socioeconomic History   Marital status: Married    Spouse name: [name]   Number of children: 3   Years of education: GED   Highest education level: GED or equivalent  Occupational History   Not on file  Tobacco Use   Smoking status: Former    Average packs/day: 0.5 packs/day for 65.0 years (32.5 ttl pk-yrs)    Types: Cigarettes    Start date: 1960   Smokeless tobacco: Never   Tobacco comments:    Resumed smoking, after quit 01/2018    Stopped smoking December 21, 2023  Vaping Use   Vaping status: Never Used  Substance and Sexual Activity   Alcohol use: Not Currently     Alcohol/week: 1.0 standard drink of alcohol    Types: 1 Standard drinks or equivalent per week    Comment: occasionally drinks a margarita   Drug use: No   Sexual activity: Not on file  Other Topics Concern   Not on file  Social History Narrative   Right handed    2-3 cups coffee per day         Social Drivers of Health   Financial Resource Strain: Medium Risk (02/03/2024)   Overall Financial Resource Strain (CARDIA)    Difficulty of Paying Living Expenses: Somewhat hard  Food Insecurity: Food Insecurity Present (02/03/2024)   Hunger Vital Sign    Worried About Running Out of Food in the Last Year: Sometimes true    Ran Out of Food in the Last Year: Sometimes true  Transportation Needs: No Transportation Needs (02/03/2024)   PRAPARE - Administrator, Civil Service (Medical): No    Lack of Transportation (Non-Medical): No  Physical Activity: Insufficiently Active (02/03/2024)   Exercise Vital Sign    Days of Exercise per Week: 2 days    Minutes of Exercise per Session: 60 min  Stress: Stress Concern Present (02/03/2024)   [employer] of Occupational Health - Occupational Stress Questionnaire    Feeling of Stress : To some extent  Social Connections: Moderately Isolated (02/03/2024)   Social Connection and Isolation Panel [NHANES]    Frequency of Communication with Friends and Family: Three times a week    Frequency of Social Gatherings with Friends and Family: Never    Attends Religious Services: Never    Database administrator or Organizations: No    Attends Banker Meetings: Never    Marital Status: Married    Tobacco Counseling Counseling given: Not Answered Tobacco comments: Resumed smoking, after quit 01/2018 Stopped smoking December 21, 2023   Clinical Intake:  Pre-visit preparation completed: Yes  Pain : 0-10 Pain Score: 3  Pain Type: Chronic pain Pain Location: Knee Pain Orientation: Left Pain Radiating Towards: to ankle Pain  Descriptors / Indicators: Aching Pain Onset: More than a month ago Pain Frequency: Constant     BMI - recorded: 24.8 Nutritional Status: BMI of 19-24  Normal Nutritional Risks:  None Diabetes: No  How often do you need to have someone help you when you read instructions, pamphlets, or other written materials from your doctor or pharmacy?: 1 - Never  Interpreter Needed?: No  Information entered by :: Kennedy Bucker, LPN   Activities of Daily Living    02/03/2024    8:25 AM 12/21/2023    9:00 PM  In your present state of health, do you have any difficulty performing the following activities:  Hearing? 0 0  Vision? 0 0  Difficulty concentrating or making decisions? 1 0  Walking or climbing stairs? 1   Comment ANKLE PAIN   Dressing or bathing? 0   Doing errands, shopping? 0 1  Preparing Food and eating ? N   Using the Toilet? N   In the past six months, have you accidently leaked urine? N   Do you have problems with loss of bowel control? N   Managing your Medications? Y   Managing your Finances? N   Housekeeping or managing your Housekeeping? N     Patient Care Team: Smitty Cords, DO as PCP - General (Family Medicine) Antonieta Iba, MD as PCP - Cardiology (Cardiology) Lanier Prude, MD as PCP - Electrophysiology (Cardiology) Antonieta Iba, MD as Consulting Physician (Cardiology) Bridgett Larsson, LCSW as Social Worker (Licensed Clinical Social Worker) Homsher, Wynona Canes, RN as VBCI Care Management  Indicate any recent Medical Services you may have received from other than [facility] providers in the past year (date may be approximate).     Assessment:   This is a routine wellness examination for [place].  Hearing/Vision screen Hearing Screening - Comments:: NO AIDS Vision Screening - Comments:: READERS- MD IN [place]    Goals Addressed             This Visit's Progress    Cut out extra servings         Depression Screen    02/03/2024     8:21 AM 01/12/2024   11:10 AM 01/10/2024    2:44 PM 01/06/2024   11:50 AM 01/28/2023    8:21 AM 08/09/2022   10:14 AM 03/24/2022    1:13 PM  PHQ 2/9 Scores  PHQ - 2 Score 1 0 6 0 1 4 4   PHQ- 9 Score 2 2 15  3 9 9     Fall Risk    02/03/2024    8:25 AM 01/10/2024    2:44 PM 01/28/2023    8:25 AM 08/09/2022   10:14 AM 02/24/2022    9:12 AM  Fall Risk   Falls in the past year? 1 1 0 0 0  Number falls in past yr: 0 0 0 0 0  Injury with Fall? 1 1 0 0 0  Risk for fall due to : Impaired balance/gait  No Fall Risks No Fall Risks No Fall Risks  Follow up Falls evaluation completed;Falls prevention discussed  Falls prevention discussed;Falls evaluation completed Falls evaluation completed Falls evaluation completed    MEDICARE RISK AT HOME: Medicare Risk at Home Any stairs in or around the home?: Yes If so, are there any without handrails?: No Home free of loose throw rugs in walkways, pet beds, electrical cords, etc?: Yes Adequate lighting in your home to reduce risk of falls?: Yes Life alert?: No Use of a cane, walker or w/c?: Yes (ROLLATOR) Grab bars in the bathroom?: Yes Shower chair or bench in shower?: Yes Elevated toilet seat or a handicapped toilet?: Yes  TIMED UP  AND GO:  Was the test performed?  No    Cognitive Function:        02/03/2024    8:28 AM 01/28/2023    8:27 AM 12/23/2020    9:43 AM 02/27/2019    3:59 PM 02/21/2018    4:36 PM  6CIT Screen  What Year? 0 points 0 points 0 points 0 points 0 points  What month? 0 points 0 points 0 points 0 points 0 points  What time? 0 points 0 points 0 points 0 points 0 points  Count back from 20 0 points 0 points 0 points 0 points 0 points  Months in reverse 4 points 4 points 4 points 2 points 0 points  Repeat phrase 0 points 2 points 2 points 0 points 0 points  Total Score 4 points 6 points 6 points 2 points 0 points    Immunizations There is no immunization history for the selected administration types on file for this  patient.  TDAP status: Due, Education has been provided regarding the importance of this vaccine. Advised may receive this vaccine at local pharmacy or Health Dept. Aware to provide a copy of the vaccination record if obtained from local pharmacy or Health Dept. Verbalized acceptance and understanding.  Flu Vaccine status: Declined, Education has been provided regarding the importance of this vaccine but patient still declined. Advised may receive this vaccine at local pharmacy or Health Dept. Aware to provide a copy of the vaccination record if obtained from local pharmacy or Health Dept. Verbalized acceptance and understanding.  Pneumococcal vaccine status: Declined,  Education has been provided regarding the importance of this vaccine but patient still declined. Advised may receive this vaccine at local pharmacy or Health Dept. Aware to provide a copy of the vaccination record if obtained from local pharmacy or Health Dept. Verbalized acceptance and understanding.   Covid-19 vaccine status: Declined, Education has been provided regarding the importance of this vaccine but patient still declined. Advised may receive this vaccine at local pharmacy or Health Dept.or vaccine clinic. Aware to provide a copy of the vaccination record if obtained from local pharmacy or Health Dept. Verbalized acceptance and understanding.  Qualifies for Shingles Vaccine? Yes   Zostavax completed No   Shingrix Completed?: No.    Education has been provided regarding the importance of this vaccine. Patient has been advised to call insurance company to determine out of pocket expense if they have not yet received this vaccine. Advised may also receive vaccine at local pharmacy or Health Dept. Verbalized acceptance and understanding.  Screening Tests Health Maintenance  Topic Date Due   DTaP/Tdap/Td (1 - Tdap) Never done   Zoster Vaccines- Shingrix (1 of 2) Never done   Lung Cancer Screening  04/26/2023   INFLUENZA  VACCINE  03/19/2024 (Originally 07/21/2023)   Pneumonia Vaccine 9+ Years old (1 of 2 - PCV) 01/09/2025 (Originally 05/15/1947)   Medicare Annual Wellness (AWV)  02/02/2025   HPV VACCINES  Aged Out   COVID-19 Vaccine  Discontinued    Health Maintenance  Health Maintenance Due  Topic Date Due   DTaP/Tdap/Td (1 - Tdap) Never done   Zoster Vaccines- Shingrix (1 of 2) Never done   Lung Cancer Screening  04/26/2023    Colorectal cancer screening: No longer required.   Lung Cancer Screening: (Low Dose CT Chest recommended if Age 70-80 years, 20 pack-year currently smoking OR have quit w/in 15years.) does qualify.   Lung Cancer Screening Referral: DECLINED REFERRAL  Additional Screening:  Hepatitis C Screening: does not qualify; Completed NO  Vision Screening: Recommended annual ophthalmology exams for early detection of glaucoma and other disorders of the eye. Is the patient up to date with their annual eye exam?  Yes  Who is the provider or what is the name of the office in which the patient attends annual eye exams? DR.ROSENSTEIN If pt is not established with a provider, would they like to be referred to a provider to establish care? No .   Dental Screening: Recommended annual dental exams for proper oral hygiene   Community Resource Referral / Chronic Care Management: CRR required this visit?  Yes   CCM required this visit?  No     Plan:     I have personally reviewed and noted the following in the patient's chart:   Medical and social history Use of alcohol, tobacco or illicit drugs  Current medications and supplements including opioid prescriptions. Patient is not currently taking opioid prescriptions. Functional ability and status Nutritional status Physical activity Advanced directives List of other physicians Hospitalizations, surgeries, and ER visits in previous 12 months Vitals Screenings to include cognitive, depression, and falls Referrals and  appointments  In addition, I have reviewed and discussed with patient certain preventive protocols, quality metrics, and best practice recommendations. A written personalized care plan for preventive services as well as general preventive health recommendations were provided to patient.     Hal Hope, LPN   02/02/864   After Visit Summary: (MyChart) Due to this being a telephonic visit, the after visit summary with patients personalized plan was offered to patient via MyChart   Nurse Notes: SOCIAL WORK REFERRAL SENT

## 2024-02-03 NOTE — Patient Instructions (Addendum)
Mr. Formica , Thank you for taking time to come for your Medicare Wellness Visit. I appreciate your ongoing commitment to your health goals. Please review the following plan we discussed and let me know if I can assist you in the future.   Referrals/Orders/Follow-Ups/Clinician Recommendations: REFERRAL SENT FOR SOCIAL WORK  This is a list of the screening recommended for you and due dates:  Health Maintenance  Topic Date Due   DTaP/Tdap/Td vaccine (1 - Tdap) Never done   Zoster (Shingles) Vaccine (1 of 2) Never done   Screening for Lung Cancer  04/26/2023   Flu Shot  03/19/2024*   Pneumonia Vaccine (1 of 2 - PCV) 01/09/2025*   Medicare Annual Wellness Visit  02/02/2025   HPV Vaccine  Aged Out   COVID-19 Vaccine  Discontinued  *Topic was postponed. The date shown is not the original due date.    Advanced directives: (ACP Link)Information on Advanced Care Planning can be found at [facility] Advance Health Care Directives Advance Health Care Directives ([email or web address]   Next Medicare Annual Wellness Visit scheduled for next year: Yes   02/08/25 @ 8:10 AM BY PHONE

## 2024-02-03 NOTE — Progress Notes (Signed)
Complex Care Management Note Care Guide Note  02/03/2024 Name: Michael Delacruz MRN: [id number] DOB: September 15, 1941   Complex Care Management Outreach Attempts: An unsuccessful telephone outreach was attempted today to offer the patient information about available complex care management services.  Follow Up Plan:  Additional outreach attempts will be made to offer the patient complex care management information and services.   Encounter Outcome:  No Answer  Armend Hochstatter [facility]  [facility] Guide Direct Dial: [phone]  Fax: [phone] Website: [email or web address]

## 2024-02-07 ENCOUNTER — Telehealth: Payer: Self-pay

## 2024-02-07 DIAGNOSIS — I4892 Unspecified atrial flutter: Secondary | ICD-10-CM | POA: Diagnosis not present

## 2024-02-07 DIAGNOSIS — I5022 Chronic systolic (congestive) heart failure: Secondary | ICD-10-CM | POA: Diagnosis not present

## 2024-02-07 DIAGNOSIS — I442 Atrioventricular block, complete: Secondary | ICD-10-CM | POA: Diagnosis not present

## 2024-02-07 DIAGNOSIS — I4821 Permanent atrial fibrillation: Secondary | ICD-10-CM | POA: Diagnosis not present

## 2024-02-07 DIAGNOSIS — I130 Hypertensive heart and chronic kidney disease with heart failure and stage 1 through stage 4 chronic kidney disease, or unspecified chronic kidney disease: Secondary | ICD-10-CM | POA: Diagnosis not present

## 2024-02-07 DIAGNOSIS — S72402D Unspecified fracture of lower end of left femur, subsequent encounter for closed fracture with routine healing: Secondary | ICD-10-CM | POA: Diagnosis not present

## 2024-02-07 DIAGNOSIS — N189 Chronic kidney disease, unspecified: Secondary | ICD-10-CM | POA: Diagnosis not present

## 2024-02-07 DIAGNOSIS — J4489 Other specified chronic obstructive pulmonary disease: Secondary | ICD-10-CM | POA: Diagnosis not present

## 2024-02-07 DIAGNOSIS — I272 Pulmonary hypertension, unspecified: Secondary | ICD-10-CM | POA: Diagnosis not present

## 2024-02-07 NOTE — Progress Notes (Addendum)
Complex Care Management Note Care Guide Note  02/07/2024 Name: Michael Delacruz MRN: [id number] DOB: 04-23-1941  Michael Delacruz is a 83 y.o. year old male who is a primary care patient of Smitty Cords, DO . The community resource team was consulted for assistance with Food Insecurity  SDOH screenings and interventions completed:  Yes  Social Drivers of Health From This Encounter   Food Insecurity: Food Insecurity Present (02/07/2024)   Hunger Vital Sign    Worried About Running Out of Food in the Last Year: Sometimes true    Ran Out of Food in the Last Year: Never true  Housing: Low Risk  (02/07/2024)   Housing Stability Vital Sign    Unable to Pay for Housing in the Last Year: No    Number of Times Moved in the Last Year: 0    Homeless in the Last Year: No  Financial Resource Strain: Medium Risk (02/07/2024)   Overall Financial Resource Strain (CARDIA)    Difficulty of Paying Living Expenses: Somewhat hard  Transportation Needs: No Transportation Needs (02/07/2024)   PRAPARE - Administrator, Civil Service (Medical): No    Lack of Transportation (Non-Medical): No  Utilities: Not At Risk (02/07/2024)   Utilities    Threatened with loss of utilities: No    SDOH Interventions Today    Flowsheet Row Most Recent Value  SDOH Interventions   Food Insecurity Interventions [id number] Referral, Other (Comment)  [Patient consented to [facility] referral to [facility] [employer] [facility]/Emergency Food Pantry. Per patient request mailed [facility] food pantry list and food stamp information.]  Financial Strain Interventions --  [Patient would only accept assistance for food insecurity.]        Care guide performed the following interventions: Spoke with patient he  stated he is only interested in food assistance. Patient consented to [facility] referral to [facility]/Emergency Food Pantry and Sales executive. Per patient request mailed [facility] food pantry list  and food stamp information.  Follow Up Plan:  Care guide will follow up with patient by phone over the next 7 days.  Encounter Outcome:  Patient Visit Completed  Emerald Gehres [facility]  [facility] Guide Direct Dial: [phone]  Fax: [phone] Website: [email or web address]

## 2024-02-08 ENCOUNTER — Telehealth: Payer: Self-pay | Admitting: *Deleted

## 2024-02-08 NOTE — Progress Notes (Unsigned)
Complex Care Management Note Care Guide Note  02/08/2024 Name: Michael Delacruz MRN: [id number] DOB: Sep 24, 1941   Complex Care Management Outreach Attempts: A second unsuccessful outreach was attempted today to offer the patient with information about available complex care management services.  Follow Up Plan:  Additional outreach attempts will be made to offer the patient complex care management information and services.   Encounter Outcome:  No Answer  Burman Nieves, CMA, Care Guide [facility]  [facility] Guide Direct Dial: [phone]  Fax: [phone] Website: [email or web address]

## 2024-02-09 NOTE — Progress Notes (Signed)
Complex Care Management Note Care Guide Note  02/09/2024 Name: Michael Delacruz MRN: [id number] DOB: 05/01/1941   Complex Care Management Outreach Attempts: A third unsuccessful outreach was attempted today to offer the patient with information about available complex care management services.  Follow Up Plan:  No further outreach attempts will be made at this time. We have been unable to contact the patient to offer or enroll patient in complex care management services.  Encounter Outcome:  No Answer  Burman Nieves, CMA, Care Guide [facility]  [facility] Guide Direct Dial: [phone]  Fax: [phone] Website: [email or web address]

## 2024-02-13 ENCOUNTER — Ambulatory Visit: Payer: Medicare HMO | Attending: Internal Medicine

## 2024-02-13 ENCOUNTER — Telehealth: Payer: Self-pay

## 2024-02-13 DIAGNOSIS — I5020 Unspecified systolic (congestive) heart failure: Secondary | ICD-10-CM | POA: Diagnosis not present

## 2024-02-13 DIAGNOSIS — Z950 Presence of cardiac pacemaker: Secondary | ICD-10-CM | POA: Diagnosis not present

## 2024-02-13 NOTE — Progress Notes (Signed)
 Complex Care Management Note Care Guide Note  02/13/2024 Name: CASHTON HOSLEY MRN: [id number] DOB: 06/23/1941   Complex Care Management Outreach Attempts: An unsuccessful telephone outreach was attempted today to offer the patient information about available complex care management services.  Follow Up Plan:  No further outreach attempts will be made at this time. We have been unable to contact the patient to offer or enroll patient in complex care management services. Left message on voicemail to update patient on the status of referral sent to [facility]/Emergency Food Assistance Program. Spoke with Mia at [facility] the referral sent  02/07/24 has been received and the patient will be contacted this week. The office was closed last week due to the holiday and bad weather.  Encounter Outcome:  No Answer  Savana Spina [facility]  [facility] Guide Direct Dial: [phone]  Fax: [phone] Website: [email or web address]

## 2024-02-13 NOTE — Progress Notes (Signed)
 EPIC Encounter for ICM Monitoring  Patient Name: Michael Delacruz is a 83 y.o. male Date: 02/13/2024 Primary Care Physican: Smitty Cords, DO Primary Cardiologist: Mariah Milling Electrophysiologist: Joycelyn Schmid Pacing: 97%            03/16/2023 Weight: 175-178 lbs 06/27/2023 Weight: 174 lbs 01/12/2024 Weight: 175 lbs                                                            Transmission results reviewed.     Diet:  Does not follow low salt diet and uses salt at home.     CorVue thoracic impedance suggesting normal fluid levels within the last month.      Prescribed: Furosemide 40 mg Take 1 tablet (40 mg total) by mouth daily as needed (As needed for weight gain or shortness of breath).   Recommendations:  No changes.   Follow-up plan: ICM clinic phone appointment on 03/19/2024.   91 day device clinic remote transmission 04/24/2024.     EP/Cardiology Office Visits:  02/27/2024 with Dr. Mariah Milling.  Next EP visit due 10/2024 with Dr Graciela Husbands but no recall.    Copy of ICM check sent to Dr. Graciela Husbands.  3 month ICM trend: 02/08/2024.     Karie Soda, RN 02/13/2024 9:05 AM

## 2024-02-17 DIAGNOSIS — I4821 Permanent atrial fibrillation: Secondary | ICD-10-CM | POA: Diagnosis not present

## 2024-02-17 DIAGNOSIS — I272 Pulmonary hypertension, unspecified: Secondary | ICD-10-CM | POA: Diagnosis not present

## 2024-02-17 DIAGNOSIS — I130 Hypertensive heart and chronic kidney disease with heart failure and stage 1 through stage 4 chronic kidney disease, or unspecified chronic kidney disease: Secondary | ICD-10-CM | POA: Diagnosis not present

## 2024-02-17 DIAGNOSIS — I4892 Unspecified atrial flutter: Secondary | ICD-10-CM | POA: Diagnosis not present

## 2024-02-17 DIAGNOSIS — I5022 Chronic systolic (congestive) heart failure: Secondary | ICD-10-CM | POA: Diagnosis not present

## 2024-02-17 DIAGNOSIS — N189 Chronic kidney disease, unspecified: Secondary | ICD-10-CM | POA: Diagnosis not present

## 2024-02-17 DIAGNOSIS — J4489 Other specified chronic obstructive pulmonary disease: Secondary | ICD-10-CM | POA: Diagnosis not present

## 2024-02-17 DIAGNOSIS — I442 Atrioventricular block, complete: Secondary | ICD-10-CM | POA: Diagnosis not present

## 2024-02-17 DIAGNOSIS — S72402D Unspecified fracture of lower end of left femur, subsequent encounter for closed fracture with routine healing: Secondary | ICD-10-CM | POA: Diagnosis not present

## 2024-02-20 ENCOUNTER — Encounter: Payer: Self-pay | Admitting: Internal Medicine

## 2024-02-21 DIAGNOSIS — I4821 Permanent atrial fibrillation: Secondary | ICD-10-CM | POA: Diagnosis not present

## 2024-02-21 DIAGNOSIS — S72402D Unspecified fracture of lower end of left femur, subsequent encounter for closed fracture with routine healing: Secondary | ICD-10-CM | POA: Diagnosis not present

## 2024-02-21 DIAGNOSIS — J4489 Other specified chronic obstructive pulmonary disease: Secondary | ICD-10-CM | POA: Diagnosis not present

## 2024-02-21 DIAGNOSIS — I130 Hypertensive heart and chronic kidney disease with heart failure and stage 1 through stage 4 chronic kidney disease, or unspecified chronic kidney disease: Secondary | ICD-10-CM | POA: Diagnosis not present

## 2024-02-21 DIAGNOSIS — I4892 Unspecified atrial flutter: Secondary | ICD-10-CM | POA: Diagnosis not present

## 2024-02-21 DIAGNOSIS — N189 Chronic kidney disease, unspecified: Secondary | ICD-10-CM | POA: Diagnosis not present

## 2024-02-21 DIAGNOSIS — I442 Atrioventricular block, complete: Secondary | ICD-10-CM | POA: Diagnosis not present

## 2024-02-21 DIAGNOSIS — I5022 Chronic systolic (congestive) heart failure: Secondary | ICD-10-CM | POA: Diagnosis not present

## 2024-02-21 DIAGNOSIS — I272 Pulmonary hypertension, unspecified: Secondary | ICD-10-CM | POA: Diagnosis not present

## 2024-02-22 DIAGNOSIS — I4892 Unspecified atrial flutter: Secondary | ICD-10-CM | POA: Diagnosis not present

## 2024-02-22 DIAGNOSIS — S72402D Unspecified fracture of lower end of left femur, subsequent encounter for closed fracture with routine healing: Secondary | ICD-10-CM | POA: Diagnosis not present

## 2024-02-22 DIAGNOSIS — N189 Chronic kidney disease, unspecified: Secondary | ICD-10-CM | POA: Diagnosis not present

## 2024-02-22 DIAGNOSIS — I5022 Chronic systolic (congestive) heart failure: Secondary | ICD-10-CM | POA: Diagnosis not present

## 2024-02-22 DIAGNOSIS — I4821 Permanent atrial fibrillation: Secondary | ICD-10-CM | POA: Diagnosis not present

## 2024-02-22 DIAGNOSIS — I272 Pulmonary hypertension, unspecified: Secondary | ICD-10-CM | POA: Diagnosis not present

## 2024-02-22 DIAGNOSIS — J4489 Other specified chronic obstructive pulmonary disease: Secondary | ICD-10-CM | POA: Diagnosis not present

## 2024-02-22 DIAGNOSIS — I130 Hypertensive heart and chronic kidney disease with heart failure and stage 1 through stage 4 chronic kidney disease, or unspecified chronic kidney disease: Secondary | ICD-10-CM | POA: Diagnosis not present

## 2024-02-22 DIAGNOSIS — I442 Atrioventricular block, complete: Secondary | ICD-10-CM | POA: Diagnosis not present

## 2024-02-23 ENCOUNTER — Encounter (HOSPITAL_BASED_OUTPATIENT_CLINIC_OR_DEPARTMENT_OTHER): Payer: Medicare HMO | Admitting: Orthopaedic Surgery

## 2024-02-24 NOTE — Progress Notes (Deleted)
 Evaluation Performed:  Follow-up visit  Date:  02/24/2024   ID:  Michael Delacruz, DOB [birth year], MRN [id number]  Patient Location:  [address] [place] [address]   Provider location:   [facility], [place] office  PCP:  Smitty Cords, DO  Cardiologist:  [facility]  No chief complaint on file.   History of Present Illness:    Michael Delacruz is a 83 y.o. male  past medical history of long smoking history for 50 years with underlying COPD,  severe mitral valve regurgitation on echocardiogram,  prolapse of posterior leaflet,  moderate pulmonary hypertension  s/p successful MR repair by right thoracotomy by Dr. Cornelius Moras 11/ 2016 Smokes 1 ppd PAF on warfarin had bleeding, now on asa, s/p Maze Surgical report indicates clipping of left atrial appendage, Atricure left atrial clip, size 45 mm Previous history of retinal bleeding felt exacerbated by warfarin , requiring surgery 2 residual right eye  vision deficits Atrial flutter EF 40 to 45% Dilated ascending aorta 4.5 cm on CT, unchaged 2023 Who presents for follow up of his MR repair and atrial flutter  LOV 11/23   In general reports that he feels well Chronic vision issues, last site in the right eye Glasses not working  Pacer working well, pacer downloads reviewed  Lasix as needed for SOB,  "Seldom"  Rare episodes of near syncope No certain time, typically when standing BP low on today's visit, has not been checking blood pressure at home  Sedentary, no regular walking or exercise program  EKG personally reviewed by myself on todays visit Paced rhythm rate 88 bpm, PVCs  Past medical history reviewed emergency room Apr 25, 2022 for near syncope, shortness of breath Was in antique store, reports he had been shopping for around 4 hours, had been on his feet, had not had much to drink that day, " I have had 2 bottles of water in the past 3 months" That morning and had a cup of  coffee Symptoms started when he was standing at a table when he had a sudden onset of lightheadedness and felt very hot and sweaty like he was going to pass out. "Could not stand", could not step, felt it before, took over 20 min-30 to recover Diaphoresis after sitting down In the ER CXR 1.29, had not been drinking Not taking much lasix at baseline  Long smoking history Reports he is still driving but having trouble secondary to chronic vision issues " Almost need to give up and driving"  CT chest: Dilated aortic root, measuring up to 4.5 cm unchanged compared to prior Solid-appearing subcentimeter exophytic lesion of the lower pole of the right kidney, increased in size when compared with prior exam and suspicious for small RCC  Echo 03/2022 reviewed  1. Left ventricular ejection fraction, by estimation, is 45 to 50%. The  left ventricle has mildly decreased function. The left ventricle  demonstrates regional wall motion abnormalities (septal wall dyskinesis  likely from condution abnormality). There is   mild left ventricular hypertrophy. The average left ventricular global  longitudinal strain is -7.8 %. The global longitudinal strain is abnormal.   2. Right ventricular systolic function is normal. The right ventricular  size is normal.   3. Left atrial size was severely dilated.   4. The mitral valve has been repaired/replaced. No evidence of mitral  valve regurgitation. Mild mitral stenosis. The mean mitral valve gradient  is 5.0 mmHg.  There is moderate dilatation of the aortic root, measuring 45 mm.  There is mild dilatation of the ascending aorta, measuring 43 mm. There is  borderline dilatation of the aortic arch, measuring 39 mm.  Lost vision on right, chronic issue Vision on left comes and goes, no rhyme or reason  covid 19 early 2022 associated severe weakness, restlessness, and altered mental status.  Previous fall in the shower at one point and struck the left  side of his upper chest associated with severe pain.  Evaluation in the ER  CT chest 04/29/2021 4.5 cm ascending thoracic aortic aneurysm  Poor appetite, lost 30 pounds  Chronic vision issue No vision in right eye,  Has  CRT-P and AV junction ablation  Previously declined anticoagulation and cardioversion  He was started on amiodarone as a class IIb indication for rate control   amio has been discontinued because of nightmares.     May 2019 acute systolic heart failure with symptoms of impending doom.   He was in atrial flutter with uncontrolled rate.    underwent CRT-P and AV junction ablation.    Failed Entresto in the past secondary to orthostasis   Past Medical History:  Diagnosis Date   Anxiety    Arthritis    Ascending aortic aneurysm (HCC)    a. 11/2018 Echo: Ao root 43mm, Asc Ao 41mm; b. 02/2021 Echo: Ao root 47mm; c. 04/2021 CT chest: Asc Ao 4.5cm.   Asthma    Bradycardia    CAD in native artery    a. LHC 09/2015: 40% pCx, 35% mRCA.   Chronic HFrEF (heart failure with reduced ejection fraction) (HCC)    COPD (chronic obstructive pulmonary disease) (HCC)    Dilation of intestine 01/2015   Fibromyalgia    Frequent headaches    GERD (gastroesophageal reflux disease)    History of blood clots    eye    History of hiatal hernia    History of kidney stones    History of rheumatic fever    Hypertension    NICM (nonischemic cardiomyopathy) (HCC)    a. 07/2017 Echo: EF reduced to 40-45%; b. 07/2018 Echo: EF 25-30%; c. 11/2018 Echo: EF 30-35%; d. 02/2021 Echo: EF 45-50%, septal and apical septal HK. Mild MR. Sev dil LA. Mod dil RA. Ao root 47mm.   Permanent atrial fibrillation (HCC)    a. s/p TEE/DCCV 07/2015. b. H/o bleeding on Coumadin when INR >5, changed to Eliquis-subsequently discontinued; c. 04/2018 s/p SJM 3562 Quadra Allure MP DC PPM & AVN ablation.   S/P Minimally invasive maze operation for atrial fibrillation 10/22/2015   Complete bilateral atrial lesion set using  cryothermy and bipolar radiofrequency ablation with clipping of LA appendage via right mini thoracotomy approach   S/P minimally invasive mitral valve repair 10/22/2015   Complex valvuloplasty including triangular resection of posterior leaflet, artificial Gore-tex neochord placement x6 and 38 mm Sorin Memo 3D Rechord ring annuloplasty via right minithoracotomy approach   Severe mitral regurgitation s/p MVR    a. s/p MV repair 10/2015; b. 11/2018 Echo: Mild MS (mean grad ); d. 02/2021 Echo: Mild MR. Mean grad .   Sleep apnea    no longer uses cpap and doesn't use O2 at home   Stroke [facility])     no vision in right, on occasion sees a pinpoint    Thyroid disorder    TIA (transient ischemic attack)    Tobacco abuse    Past Surgical History:  Procedure Laterality Date  ANKLE SURGERY     AV NODE ABLATION N/A 05/04/2018   Procedure: AV NODE ABLATION;  Surgeon: Hillis Range, MD;  Location: [facility] INVASIVE CV LAB;  Service: Cardiovascular;  Laterality: N/A;   BIV PACEMAKER INSERTION CRT-P N/A 05/03/2018   Procedure: BIV PACEMAKER INSERTION CRT-P;  Surgeon: Duke Salvia, MD;  Location: [facility] INVASIVE CV LAB;  Service: Cardiovascular;  Laterality: N/A;   CARDIAC CATHETERIZATION N/A 10/03/2015   Procedure: Right and Left Heart Cath and Coronary Angiography;  Surgeon: Antonieta Iba, MD;  Location: [facility] INVASIVE CV LAB;  Service: Cardiovascular;  Laterality: N/A;   COLONOSCOPY     ELECTROPHYSIOLOGIC STUDY N/A 08/18/2015   Procedure: CARDIOVERSION;  Surgeon: Iran Ouch, MD;  Location: [facility] ORS;  Service: Cardiovascular;  Laterality: N/A;   MASS EXCISION N/A 03/04/2021   Procedure: EXCISION INNER NASAL CANCER;  Surgeon: Drema Halon, MD;  Location: [facility] OR;  Service: ENT;  Laterality: N/A;   MINIMALLY INVASIVE MAZE PROCEDURE N/A 10/22/2015   Procedure: MINIMALLY INVASIVE MAZE PROCEDURE;  Surgeon: Purcell Nails, MD;  Location: [facility] OR;  Service: Open Heart Surgery;  Laterality: N/A;    MITRAL VALVE REPAIR Right 10/22/2015   Procedure: MINIMALLY INVASIVE MITRAL VALVE REPAIR (MVR);  Surgeon: Purcell Nails, MD;  Location: [facility] OR;  Service: Open Heart Surgery;  Laterality: Right;   ORIF FEMUR FRACTURE Left 12/22/2023   Procedure: OPEN REDUCTION INTERNAL FIXATION (ORIF) DISTAL FEMUR FRACTURE;  Surgeon: Huel Cote, MD;  Location: [facility] ORS;  Service: Orthopedics;  Laterality: Left;  NOT HIS CARD Zimmer distal plate requested- rep aware   SINUS EXPLORATION     SKIN FULL THICKNESS GRAFT N/A 03/04/2021   Procedure: SKIN GRAFT FULL THICKNESS;  Surgeon: Drema Halon, MD;  Location: [facility] OR;  Service: ENT;  Laterality: N/A;   TEE WITHOUT CARDIOVERSION N/A 08/18/2015   Procedure: TRANSESOPHAGEAL ECHOCARDIOGRAM (TEE);  Surgeon: Iran Ouch, MD;  Location: [facility] ORS;  Service: Cardiovascular;  Laterality: N/A;   TEE WITHOUT CARDIOVERSION N/A 10/22/2015   Procedure: TRANSESOPHAGEAL ECHOCARDIOGRAM (TEE);  Surgeon: Purcell Nails, MD;  Location: [facility] OR;  Service: Open Heart Surgery;  Laterality: N/A;     No outpatient medications have been marked as taking for the 02/27/24 encounter (Appointment) with Antonieta Iba, MD.     Allergies:   Codeine, Digoxin and related, Macrodantin [nitrofurantoin macrocrystal], and Morphine and codeine   Social History   Tobacco Use   Smoking status: Former    Average packs/day: 0.5 packs/day for 65.0 years (32.5 ttl pk-yrs)    Types: Cigarettes    Start date: 1960   Smokeless tobacco: Never   Tobacco comments:    Resumed smoking, after quit 01/2018    Stopped smoking December 21, 2023  Vaping Use   Vaping status: Never Used  Substance Use Topics   Alcohol use: Not Currently    Alcohol/week: 1.0 standard drink of alcohol    Types: 1 Standard drinks or equivalent per week    Comment: occasionally drinks a margarita   Drug use: No     Family Hx: The patient's family history includes Heart murmur in his brother; Hypertension in an other  family member; Irregular heart beat in his mother; Pulmonary embolism in his brother and maternal uncle; Stroke in his mother.  ROS:   Please see the history of present illness.    Review of Systems  Constitutional: Negative.   HENT: Negative.    Eyes:  Vision deficits  Respiratory: Negative.    Cardiovascular: Negative.   Gastrointestinal: Negative.   Musculoskeletal: Negative.        Leg weakness  Neurological:  Positive for dizziness.  Psychiatric/Behavioral: Negative.    All other systems reviewed and are negative.    Labs/Other Tests and Data Reviewed:    Recent Labs: 01/27/2024: B Natriuretic Peptide 52.4; BUN 8; Creatinine, Ser 1.12; Hemoglobin 13.3; Platelets 162; Potassium 3.2; Sodium 137   Recent Lipid Panel Lab Results  Component Value Date/Time   CHOL 215 (H) 05/01/2018 10:23 AM   TRIG 79 05/01/2018 10:23 AM   HDL 42 05/01/2018 10:23 AM   CHOLHDL 5.1 05/01/2018 10:23 AM   LDLCALC 157 (H) 05/01/2018 10:23 AM    Wt Readings from Last 3 Encounters:  01/27/24 175 lb (79.4 kg)  12/22/23 176 lb 12.9 oz (80.2 kg)  10/25/23 183 lb 3.2 oz (83.1 kg)     Exam:    Vital Signs: Vital signs may also be detailed in the HPI There were no vitals taken for this visit.  Constitutional:  oriented to person, place, and time. No distress.  HENT:  Head: Grossly normal Eyes:  no discharge. No scleral icterus.  Neck: No JVD, no carotid bruits  Cardiovascular: Regular rate and rhythm, no murmurs appreciated Pulmonary/Chest: Clear to auscultation bilaterally, no wheezes or rails Abdominal: Soft.  no distension.  no tenderness.  Musculoskeletal: Normal range of motion Neurological:  normal muscle tone. Coordination normal. No atrophy Skin: Skin warm and dry Psychiatric: normal affect, pleasant  ASSESSMENT & PLAN:    Orthostasis Prior episodes of orthostasis  Recommend he try to avoid using his Lasix, stay hydrated Monitor blood pressure at home, check orthostatics at  home Paced rhythm, no significant arrhythmia picked up on pacer download  Chronic atrial fibrillation Does not want anticoagultion, previously very concerned about bleeding in his eye Prior appendage clipping during surgery Prior AV node ablation with pacer,  Followed by EP  Complete heart block (HCC) paced rhythm, followed by EP  NICM (nonischemic cardiomyopathy) (HCC) EF 30 to 35% in 11/2018 up to 45 to 50% in 02/2021 and April 2023  stable  mitral valve repair Previously declined medication changes  Atrial flutter, unspecified type (HCC) Previous TIA/strokes Does not want anticoagulation  Dilated aorta aortic ascending aorta is 4.5 cm, on CT scan May 2022 Aorta unchanged on repeat imaging 2023    Total encounter time more than 30 minutes  Greater than 50% was spent in counseling and coordination of care with the patient    Signed, Julien Nordmann, MD  02/24/2024 2:38 PM    [facility] [address], [place], [place] [address]

## 2024-02-27 ENCOUNTER — Ambulatory Visit: Payer: Medicare HMO | Attending: Cardiovascular Disease | Admitting: Cardiovascular Disease

## 2024-02-27 DIAGNOSIS — Z9889 Other specified postprocedural states: Secondary | ICD-10-CM

## 2024-02-27 DIAGNOSIS — I272 Pulmonary hypertension, unspecified: Secondary | ICD-10-CM

## 2024-02-27 DIAGNOSIS — I428 Other cardiomyopathies: Secondary | ICD-10-CM

## 2024-02-27 DIAGNOSIS — I5020 Unspecified systolic (congestive) heart failure: Secondary | ICD-10-CM

## 2024-02-27 DIAGNOSIS — Z950 Presence of cardiac pacemaker: Secondary | ICD-10-CM

## 2024-02-27 DIAGNOSIS — I7121 Aneurysm of the ascending aorta, without rupture: Secondary | ICD-10-CM

## 2024-02-27 DIAGNOSIS — I442 Atrioventricular block, complete: Secondary | ICD-10-CM

## 2024-02-27 DIAGNOSIS — I4821 Permanent atrial fibrillation: Secondary | ICD-10-CM

## 2024-02-27 DIAGNOSIS — I340 Nonrheumatic mitral (valve) insufficiency: Secondary | ICD-10-CM

## 2024-02-27 DIAGNOSIS — I5022 Chronic systolic (congestive) heart failure: Secondary | ICD-10-CM

## 2024-02-28 DIAGNOSIS — S72402D Unspecified fracture of lower end of left femur, subsequent encounter for closed fracture with routine healing: Secondary | ICD-10-CM | POA: Diagnosis not present

## 2024-02-28 DIAGNOSIS — I130 Hypertensive heart and chronic kidney disease with heart failure and stage 1 through stage 4 chronic kidney disease, or unspecified chronic kidney disease: Secondary | ICD-10-CM | POA: Diagnosis not present

## 2024-02-28 DIAGNOSIS — I4821 Permanent atrial fibrillation: Secondary | ICD-10-CM | POA: Diagnosis not present

## 2024-02-28 DIAGNOSIS — I4892 Unspecified atrial flutter: Secondary | ICD-10-CM | POA: Diagnosis not present

## 2024-02-28 DIAGNOSIS — I5022 Chronic systolic (congestive) heart failure: Secondary | ICD-10-CM | POA: Diagnosis not present

## 2024-02-28 DIAGNOSIS — J4489 Other specified chronic obstructive pulmonary disease: Secondary | ICD-10-CM | POA: Diagnosis not present

## 2024-02-28 DIAGNOSIS — I272 Pulmonary hypertension, unspecified: Secondary | ICD-10-CM | POA: Diagnosis not present

## 2024-02-28 DIAGNOSIS — N189 Chronic kidney disease, unspecified: Secondary | ICD-10-CM | POA: Diagnosis not present

## 2024-02-28 DIAGNOSIS — I442 Atrioventricular block, complete: Secondary | ICD-10-CM | POA: Diagnosis not present

## 2024-03-01 NOTE — Progress Notes (Signed)
 Remote pacemaker transmission.

## 2024-03-05 ENCOUNTER — Ambulatory Visit (INDEPENDENT_AMBULATORY_CARE_PROVIDER_SITE_OTHER): Admitting: Student

## 2024-03-05 ENCOUNTER — Ambulatory Visit (HOSPITAL_BASED_OUTPATIENT_CLINIC_OR_DEPARTMENT_OTHER)

## 2024-03-05 ENCOUNTER — Encounter (HOSPITAL_BASED_OUTPATIENT_CLINIC_OR_DEPARTMENT_OTHER): Admitting: Orthopaedic Surgery

## 2024-03-05 DIAGNOSIS — S72492A Other fracture of lower end of left femur, initial encounter for closed fracture: Secondary | ICD-10-CM | POA: Diagnosis not present

## 2024-03-05 NOTE — Progress Notes (Signed)
 Post Operative Evaluation    Procedure/Date of Surgery: Left distal femur open reduction internal fixation 1/2  Interval History:   Patient is here today 2-1/2 months status post the above procedure reportedly doing very well.  States that his range of motion has continued to improve.  Does still have some occasional burning sensations.  Denies needing to take pain medication.  He has finished with physical therapy.  PMH/PSH/Family History/Social History/Meds/Allergies:    Past Medical History:  Diagnosis Date   Anxiety    Arthritis    Ascending aortic aneurysm (HCC)    a. 11/2018 Echo: Ao root 43mm, Asc Ao 41mm; b. 02/2021 Echo: Ao root 47mm; c. 04/2021 CT chest: Asc Ao 4.5cm.   Asthma    Bradycardia    CAD in native artery    a. LHC 09/2015: 40% pCx, 35% mRCA.   Chronic HFrEF (heart failure with reduced ejection fraction) (HCC)    COPD (chronic obstructive pulmonary disease) (HCC)    Dilation of intestine 01/2015   Fibromyalgia    Frequent headaches    GERD (gastroesophageal reflux disease)    History of blood clots    eye    History of hiatal hernia    History of kidney stones    History of rheumatic fever    Hypertension    NICM (nonischemic cardiomyopathy) (HCC)    a. 07/2017 Echo: EF reduced to 40-45%; b. 07/2018 Echo: EF 25-30%; c. 11/2018 Echo: EF 30-35%; d. 02/2021 Echo: EF 45-50%, septal and apical septal HK. Mild MR. Sev dil LA. Mod dil RA. Ao root 47mm.   Permanent atrial fibrillation (HCC)    a. s/p TEE/DCCV 07/2015. b. H/o bleeding on Coumadin when INR >5, changed to Eliquis-subsequently discontinued; c. 04/2018 s/p SJM 3562 Quadra Allure MP DC PPM & AVN ablation.   S/P Minimally invasive maze operation for atrial fibrillation 10/22/2015   Complete bilateral atrial lesion set using cryothermy and bipolar radiofrequency ablation with clipping of LA appendage via right mini thoracotomy approach   S/P minimally invasive mitral valve  repair 10/22/2015   Complex valvuloplasty including triangular resection of posterior leaflet, artificial Gore-tex neochord placement x6 and 38 mm Sorin Memo 3D Rechord ring annuloplasty via right minithoracotomy approach   Severe mitral regurgitation s/p MVR    a. s/p MV repair 10/2015; b. 11/2018 Echo: Mild MS (mean grad ); d. 02/2021 Echo: Mild MR. Mean grad .   Sleep apnea    no longer uses cpap and doesn't use O2 at home   Stroke [facility])     no vision in right, on occasion sees a pinpoint    Thyroid disorder    TIA (transient ischemic attack)    Tobacco abuse    Past Surgical History:  Procedure Laterality Date   ANKLE SURGERY     AV NODE ABLATION N/A 05/04/2018   Procedure: AV NODE ABLATION;  Surgeon: Hillis Range, MD;  [facility] INVAS[facility] CV LAB;  Service: Cardiovascular;  Laterality: N/A;   BIV PACEMAKER INSERTION CRT-P N/A 05/03/2018   Procedure: BIV PACEMAKER INSERTION CRT-P;  Surgeon: Duke Salvia, MD;  Location: [facility] INVASIVE CV LAB;  Service: Cardiovascular;  Laterality: N/A;   CARDIAC CATHETERIZATION N/A 10/03/2015   Procedure: Right and Left Heart Cath and Coronary Angiography;  Surgeon: Antonieta Iba, MD;  Location: [facility]  INVASIVE CV LAB;  Service: Cardiovascular;  Laterality: N/A;   COLONOSCOPY     ELECTROPHYSIOLOGIC STUDY N/A 08/18/2015   Procedure: CARDIOVERSION;  Surgeon: Iran Ouch, MD;  Location: [facility] ORS;  Service: Cardiovascular;  Laterality: N/A;   MASS EXCISION N/A 03/04/2021   Procedure: EXCISION INNER NASAL CANCER;  Surgeon: Drema Halon, MD;  Location: [facility] OR;  Service: ENT;  Laterality: N/A;   MINIMALLY INVASIVE MAZE PROCEDURE N/A 10/22/2015   Procedure: MINIMALLY INVASIVE MAZE PROCEDURE;  Surgeon: Purcell Nails, MD;  Location: [facility] OR;  Service: Open Heart Surgery;  Laterality: N/A;   MITRAL VALVE REPAIR Right 10/22/2015   Procedure: MINIMALLY INVASIVE MITRAL VALVE REPAIR (MVR);  Surgeon: Purcell Nails, MD;  Location: [facility] OR;  Service:  Open Heart Surgery;  Laterality: Right;   ORIF FEMUR FRACTURE Left 12/22/2023   Procedure: OPEN REDUCTION INTERNAL FIXATION (ORIF) DISTAL FEMUR FRACTURE;  Surgeon: Huel Cote, MD;  Location: [facility] ORS;  Service: Orthopedics;  Laterality: Left;  NOT HIS CARD Zimmer distal plate requested- rep aware   SINUS EXPLORATION     SKIN FULL THICKNESS GRAFT N/A 03/04/2021   Procedure: SKIN GRAFT FULL THICKNESS;  Surgeon: Drema Halon, MD;  Location: [facility] OR;  Service: ENT;  Laterality: N/A;   TEE WITHOUT CARDIOVERSION N/A 08/18/2015   Procedure: TRANSESOPHAGEAL ECHOCARDIOGRAM (TEE);  Surgeon: Iran Ouch, MD;  Location: [facility] ORS;  Service: Cardiovascular;  Laterality: N/A;   TEE WITHOUT CARDIOVERSION N/A 10/22/2015   Procedure: TRANSESOPHAGEAL ECHOCARDIOGRAM (TEE);  Surgeon: Purcell Nails, MD;  Location: [facility] OR;  Service: Open Heart Surgery;  Laterality: N/A;   Social History   Socioeconomic History   Marital status: Married    Spouse name: [name]   Number of children: 3   Years of education: GED   Highest education level: GED or equivalent  Occupational History   Not on file  Tobacco Use   Smoking status: Former    Average packs/day: 0.5 packs/day for 65.0 years (32.5 ttl pk-yrs)    Types: Cigarettes    Start date: 1960   Smokeless tobacco: Never   Tobacco comments:    Resumed smoking, after quit 01/2018    Stopped smoking December 21, 2023  Vaping Use   Vaping status: Never Used  Substance and Sexual Activity   Alcohol use: Not Currently    Alcohol/week: 1.0 standard drink of alcohol    Types: 1 Standard drinks or equivalent per week    Comment: occasionally drinks a margarita   Drug use: No   Sexual activity: Not on file  Other Topics Concern   Not on file  Social History Narrative   Right handed    2-3 cups coffee per day         Social Drivers of Health   Financial Resource Strain: Medium Risk (02/07/2024)   Overall Financial Resource Strain (CARDIA)     Difficulty of Paying Living Expenses: Somewhat hard  Food Insecurity: Food Insecurity Present (02/07/2024)   Hunger Vital Sign    Worried About Running Out of Food in the Last Year: Sometimes true    Ran Out of Food in the Last Year: Never true  Transportation Needs: No Transportation Needs (02/07/2024)   PRAPARE - Administrator, Civil Service (Medical): No    Lack of Transportation (Non-Medical): No  Physical Activity: Insufficiently Active (02/03/2024)   Exercise Vital Sign    Days of Exercise per Week: 2 days    Minutes of Exercise per  Session: 60 min  Stress: Stress Concern Present (02/03/2024)   [employer] of Occupational Health - Occupational Stress Questionnaire    Feeling of Stress : To some extent  Social Connections: Moderately Isolated (02/03/2024)   Social Connection and Isolation Panel [NHANES]    Frequency of Communication with Friends and Family: Three times a week    Frequency of Social Gatherings with Friends and Family: Never    Attends Religious Services: Never    Database administrator or Organizations: No    Attends Engineer, structural: Never    Marital Status: Married   Family History  Problem Relation Age of Onset   Stroke Mother    Irregular heart beat Mother    Heart murmur Brother    Pulmonary embolism Brother    Hypertension Other    Pulmonary embolism Maternal Uncle    Allergies  Allergen Reactions   Codeine Nausea Only   Digoxin And Related     SOB, bad dreams   Macrodantin [Nitrofurantoin Macrocrystal] Rash   Morphine And Codeine Rash   Current Outpatient Medications  Medication Sig Dispense Refill   albuterol (VENTOLIN HFA) 108 (90 Base) MCG/ACT inhaler Inhale 2 puffs into the lungs every 6 (six) hours as needed for wheezing or shortness of breath. 8 g 0   ALPRAZolam (XANAX) 0.25 MG tablet Take 1 tablet (0.25 mg total) by mouth 2 (two) times daily as needed for anxiety. 60 tablet 0   aspirin 325 MG tablet Take 1  tablet (325 mg total) by mouth daily. (Patient not taking: Reported on 03/05/2024) 90 tablet 0   furosemide (LASIX) 40 MG tablet Take 40 mg by mouth daily. As needed     magic mouthwash w/lidocaine SOLN Swish, gargle, and spit one to two teaspoonfuls every six hours as needed. Shake well before using. 120 mL 0   oxycodone (OXY-IR) 5 MG capsule Take 5 mg by mouth every 6 (six) hours as needed. As needed (Patient not taking: Reported on 03/05/2024)     traZODone (DESYREL) 100 MG tablet Take 1 tablet (100 mg total) by mouth at bedtime. 90 tablet 1   No current facility-administered medications for this visit.   No results found.  Review of Systems:   A ROS was performed including pertinent positives and negatives as documented in the HPI.   Musculoskeletal Exam:    There were no vitals taken for this visit.  Left knee incision is well-appearing without evidence of erythema or drainage.  Active range of motion of the left knee from 0-110 degrees.  Distal neurosensory exam intact.  Imaging:   X-ray 2 views left femur: Distal femur ORIF hardware in good positioning without evidence of damage or loosening.  Increased healing within distal femur fracture.    I personally reviewed and interpreted the radiographs.   Assessment:   2-1/2 months status post distal femur ORIF.  He has continued to show significant improvement particular with range of motion.  He has been able to weight-bear without assistance.  On today's x-rays no hardware does appear to be in good position and there has been significant improvements in bone healing compared to prior x-rays over 1 month ago.  Given this, he can continue activity as tolerated and can return on an as-needed basis as repeat routine x-rays will not be necessary.  Plan :    -Return to clinic as needed      I personally saw and evaluated the patient, and participated in the management and  treatment plan.   Hazle Nordmann, PA-C Orthopedics

## 2024-03-19 ENCOUNTER — Ambulatory Visit: Payer: Medicare HMO | Attending: Internal Medicine

## 2024-03-19 DIAGNOSIS — Z950 Presence of cardiac pacemaker: Secondary | ICD-10-CM

## 2024-03-19 DIAGNOSIS — I5020 Unspecified systolic (congestive) heart failure: Secondary | ICD-10-CM

## 2024-03-19 NOTE — Progress Notes (Signed)
 EPIC Encounter for ICM Monitoring  Patient Name: Michael Delacruz is a 83 y.o. male Date: 03/19/2024 Primary Care Physican: Smitty Cords, DO Primary Cardiologist: Mariah Milling Electrophysiologist: Joycelyn Schmid Pacing: 97%            03/16/2023 Weight: 175-178 lbs 06/27/2023 Weight: 174 lbs 01/12/2024 Weight: 175 lbs                                                           Spoke with wife, per DPR and heart failure questions reviewed.  Transmission results reviewed.  She reports he has not complained about any fluid symptoms and has recovered from his broken femur.     Diet:  Does not follow low salt diet and uses salt at home.     CorVue thoracic impedance suggesting possible fluid accumulation starting 5/18 and returned close to normal 3/31.      Prescribed: Furosemide 40 mg Take 1 tablet (40 mg total) by mouth daily as needed (As needed for weight gain or shortness of breath).   Recommendations:  Recommendation to limit salt intake.  Encouraged to call if experiencing any fluid symptoms.    Follow-up plan: ICM clinic phone appointment on 04/25/2024.   91 day device clinic remote transmission 04/24/2024.     EP/Cardiology Office Visits:  04/30/2024 with Dr. Mariah Milling.  Next EP visit due 10/2024 with Dr Graciela Husbands but no recall.    Copy of ICM check sent to Dr. Graciela Husbands.  3 month ICM trend: 03/19/2024.    12-14 Month ICM trend:     Karie Soda, RN 03/19/2024 2:22 PM

## 2024-04-03 ENCOUNTER — Encounter (HOSPITAL_COMMUNITY): Payer: Self-pay | Admitting: Emergency Medicine

## 2024-04-03 ENCOUNTER — Other Ambulatory Visit: Payer: Self-pay

## 2024-04-03 ENCOUNTER — Emergency Department (HOSPITAL_COMMUNITY)
Admission: EM | Admit: 2024-04-03 | Discharge: 2024-04-04 | Disposition: A | Discharge status: Home / Self Care | Attending: Emergency Medicine | Admitting: Emergency Medicine

## 2024-04-03 DIAGNOSIS — J45909 Unspecified asthma, uncomplicated: Secondary | ICD-10-CM | POA: Diagnosis not present

## 2024-04-03 DIAGNOSIS — I509 Heart failure, unspecified: Secondary | ICD-10-CM | POA: Diagnosis not present

## 2024-04-03 DIAGNOSIS — I2510 Atherosclerotic heart disease of native coronary artery without angina pectoris: Secondary | ICD-10-CM | POA: Insufficient documentation

## 2024-04-03 DIAGNOSIS — H5711 Ocular pain, right eye: Secondary | ICD-10-CM | POA: Insufficient documentation

## 2024-04-03 DIAGNOSIS — Z87891 Personal history of nicotine dependence: Secondary | ICD-10-CM | POA: Diagnosis not present

## 2024-04-03 DIAGNOSIS — I110 Hypertensive heart disease with heart failure: Secondary | ICD-10-CM | POA: Insufficient documentation

## 2024-04-03 DIAGNOSIS — H409 Unspecified glaucoma: Secondary | ICD-10-CM | POA: Diagnosis not present

## 2024-04-03 DIAGNOSIS — H40211 Acute angle-closure glaucoma, right eye: Secondary | ICD-10-CM | POA: Diagnosis not present

## 2024-04-03 DIAGNOSIS — J449 Chronic obstructive pulmonary disease, unspecified: Secondary | ICD-10-CM | POA: Diagnosis not present

## 2024-04-03 DIAGNOSIS — H4051X0 Glaucoma secondary to other eye disorders, right eye, stage unspecified: Secondary | ICD-10-CM | POA: Diagnosis not present

## 2024-04-03 MED ORDER — TETRACAINE HCL 0.5 % OP SOLN
2.0000 [drp] | Freq: Once | OPHTHALMIC | Status: AC
  Administered 2024-04-04: 2 [drp] via OPHTHALMIC
  Filled 2024-04-03: qty 4

## 2024-04-03 MED ORDER — FLUORESCEIN SODIUM 1 MG OP STRP
1.0000 | ORAL_STRIP | Freq: Once | OPHTHALMIC | Status: AC
  Administered 2024-04-04: 1 via OPHTHALMIC
  Filled 2024-04-03: qty 1

## 2024-04-03 NOTE — ED Triage Notes (Signed)
 Patient coming to ED for evaluation of R eye pain and redness.  Reports pain has been intermittent x weeks.  Tonight "after dark" pain became severe.  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  States eye pain radiates into head.

## 2024-04-03 NOTE — ED Notes (Signed)
 Pt called multiple times for rm, no response

## 2024-04-04 DIAGNOSIS — H4051X3 Glaucoma secondary to other eye disorders, right eye, severe stage: Secondary | ICD-10-CM | POA: Diagnosis not present

## 2024-04-04 LAB — CBC
HCT: 43.8 % (ref 39.0–52.0)
Hemoglobin: 14.6 g/dL (ref 13.0–17.0)
MCH: 31 pg (ref 26.0–34.0)
MCHC: 33.3 g/dL (ref 30.0–36.0)
MCV: 93 fL (ref 80.0–100.0)
Platelets: 157 10*3/uL (ref 150–400)
RBC: 4.71 MIL/uL (ref 4.22–5.81)
RDW: 13.2 % (ref 11.5–15.5)
WBC: 7.3 10*3/uL (ref 4.0–10.5)
nRBC: 0 % (ref 0.0–0.2)

## 2024-04-04 LAB — BASIC METABOLIC PANEL WITH GFR
Anion gap: 13 (ref 5–15)
BUN: 7 mg/dL — ABNORMAL LOW (ref 8–23)
CO2: 21 mmol/L — ABNORMAL LOW (ref 22–32)
Calcium: 9.4 mg/dL (ref 8.9–10.3)
Chloride: 105 mmol/L (ref 98–111)
Creatinine, Ser: 1.19 mg/dL (ref 0.61–1.24)
GFR, Estimated: >60 mL/min (ref >60)
Glucose, Bld: 122 mg/dL — ABNORMAL HIGH (ref 70–99)
Potassium: 3.8 mmol/L (ref 3.5–5.1)
Sodium: 139 mmol/L (ref 135–145)

## 2024-04-04 MED ORDER — PILOCARPINE HCL 2 % OP SOLN
1.0000 [drp] | Freq: Once | OPHTHALMIC | Status: DC
  Filled 2024-04-04: qty 15

## 2024-04-04 MED ORDER — ACETAZOLAMIDE ER 500 MG PO CP12: 500.0000 mg | ORAL_CAPSULE | Freq: Two times a day (BID) | ORAL | 0 refills | Status: DC

## 2024-04-04 MED ORDER — APRACLONIDINE HCL 1 % OP SOLN
1.0000 [drp] | Freq: Once | OPHTHALMIC | Status: DC
  Filled 2024-04-04: qty 2.4

## 2024-04-04 MED ORDER — TIMOLOL MALEATE 0.5 % OP SOLN
1.0000 [drp] | Freq: Once | OPHTHALMIC | Status: AC
  Administered 2024-04-04: 1 [drp] via OPHTHALMIC
  Filled 2024-04-04: qty 5

## 2024-04-04 MED ORDER — TIMOLOL MALEATE 0.5 % OP SOLN: 1.0000 [drp] | Freq: Two times a day (BID) | OPHTHALMIC | 0 refills | Status: DC

## 2024-04-04 MED ORDER — DORZOLAMIDE HCL 2 % OP SOLN: 1.0000 [drp] | Freq: Two times a day (BID) | OPHTHALMIC | 0 refills | Status: DC

## 2024-04-04 NOTE — ED Provider Notes (Signed)
 [facility] Provider Note MRN:  [id number]  Arrival date & time: 04/04/24     Chief Complaint   Eye Pain   History of Present Illness   Michael Delacruz is a 83 y.o. year-old male with a history of CAD, COPD, A-fib, stroke presenting to the ED with chief complaint of eye pain.  Mild to moderate pain to the right orbit over the past week or so.  Waxing and waning severity.  Became much worse this evening, very significant pain keeping him from sleeping or functioning at home, here for evaluation.  Denies trauma to the eye recently, does not think he scratched the eye.  Patient has been blind in this eye for 6 years stemming from a stroke and so no visual change.  Review of Systems  A thorough review of systems was obtained and all systems are negative except as noted in the HPI and PMH.   Patient's Health History    Past Medical History:  Diagnosis Date   Anxiety    Arthritis    Ascending aortic aneurysm (HCC)    a. 11/2018 Echo: Ao root 43mm, Asc Ao 41mm; b. 02/2021 Echo: Ao root 47mm; c. 04/2021 CT chest: Asc Ao 4.5cm.   Asthma    Bradycardia    CAD in native artery    a. LHC 09/2015: 40% pCx, 35% mRCA.   Chronic HFrEF (heart failure with reduced ejection fraction) (HCC)    COPD (chronic obstructive pulmonary disease) (HCC)    Dilation of intestine 01/2015   Fibromyalgia    Frequent headaches    GERD (gastroesophageal reflux disease)    History of blood clots    eye    History of hiatal hernia    History of kidney stones    History of rheumatic fever    Hypertension    NICM (nonischemic cardiomyopathy) (HCC)    a. 07/2017 Echo: EF reduced to 40-45%; b. 07/2018 Echo: EF 25-30%; c. 11/2018 Echo: EF 30-35%; d. 02/2021 Echo: EF 45-50%, septal and apical septal HK. Mild MR. Sev dil LA. Mod dil RA. Ao root 47mm.   Permanent atrial fibrillation (HCC)    a. s/p TEE/DCCV 07/2015. b. H/o bleeding on Coumadin when INR >5, changed to  Eliquis-subsequently discontinued; c. 04/2018 s/p SJM 3562 Quadra Allure MP DC PPM & AVN ablation.   S/P Minimally invasive maze operation for atrial fibrillation 10/22/2015   Complete bilateral atrial lesion set using cryothermy and bipolar radiofrequency ablation with clipping of LA appendage via right mini thoracotomy approach   S/P minimally invasive mitral valve repair 10/22/2015   Complex valvuloplasty including triangular resection of posterior leaflet, artificial Gore-tex neochord placement x6 and 38 mm Sorin Memo 3D Rechord ring annuloplasty via right minithoracotomy approach   Severe mitral regurgitation s/p MVR    a. s/p MV repair 10/2015; b. 11/2018 Echo: Mild MS (mean grad ); d. 02/2021 Echo: Mild MR. Mean grad .   Sleep apnea    no longer uses cpap and doesn't use O2 at home   Stroke [facility])     no vision in right, on occasion sees a pinpoint    Thyroid disorder    TIA (transient ischemic attack)    Tobacco abuse     Past Surgical History:  Procedure Laterality Date   ANKLE SURGERY     AV NODE ABLATION N/A 05/04/2018   Procedure: AV NODE ABLATION;  Surgeon: Jolly Needle, MD;  [facility] INVASIVE CV LAB;  Service: Cardiovascular;  Laterality: N/A;   BIV PACEMAKER INSERTION CRT-P N/A 05/03/2018   Procedure: BIV PACEMAKER INSERTION CRT-P;  Surgeon: Verona Goodwill, MD;  Location: [facility] INVASIVE CV LAB;  Service: Cardiovascular;  Laterality: N/A;   CARDIAC CATHETERIZATION N/A 10/03/2015   Procedure: Right and Left Heart Cath and Coronary Angiography;  Surgeon: Devorah Fonder, MD;  Location: [facility] INVASIVE CV LAB;  Service: Cardiovascular;  Laterality: N/A;   COLONOSCOPY     ELECTROPHYSIOLOGIC STUDY N/A 08/18/2015   Procedure: CARDIOVERSION;  Surgeon: Wenona Hamilton, MD;  Location: [facility] ORS;  Service: Cardiovascular;  Laterality: N/A;   MASS EXCISION N/A 03/04/2021   Procedure: EXCISION INNER NASAL CANCER;  Surgeon: Prescott Brodie, MD;  Location: [facility] OR;  Service: ENT;   Laterality: N/A;   MINIMALLY INVASIVE MAZE PROCEDURE N/A 10/22/2015   Procedure: MINIMALLY INVASIVE MAZE PROCEDURE;  Surgeon: Gardenia Jump, MD;  Location: [facility] OR;  Service: Open Heart Surgery;  Laterality: N/A;   MITRAL VALVE REPAIR Right 10/22/2015   Procedure: MINIMALLY INVASIVE MITRAL VALVE REPAIR (MVR);  Surgeon: Gardenia Jump, MD;  Location: [facility] OR;  Service: Open Heart Surgery;  Laterality: Right;   ORIF FEMUR FRACTURE Left 12/22/2023   Procedure: OPEN REDUCTION INTERNAL FIXATION (ORIF) DISTAL FEMUR FRACTURE;  Surgeon: Wilhelmenia Harada, MD;  Location: [facility] ORS;  Service: Orthopedics;  Laterality: Left;  NOT HIS CARD Zimmer distal plate requested- rep aware   SINUS EXPLORATION     SKIN FULL THICKNESS GRAFT N/A 03/04/2021   Procedure: SKIN GRAFT FULL THICKNESS;  Surgeon: Prescott Brodie, MD;  Location: [facility] OR;  Service: ENT;  Laterality: N/A;   TEE WITHOUT CARDIOVERSION N/A 08/18/2015   Procedure: TRANSESOPHAGEAL ECHOCARDIOGRAM (TEE);  Surgeon: Wenona Hamilton, MD;  Location: [facility] ORS;  Service: Cardiovascular;  Laterality: N/A;   TEE WITHOUT CARDIOVERSION N/A 10/22/2015   Procedure: TRANSESOPHAGEAL ECHOCARDIOGRAM (TEE);  Surgeon: Gardenia Jump, MD;  Location: [facility] OR;  Service: Open Heart Surgery;  Laterality: N/A;    Family History  Problem Relation Age of Onset   Stroke Mother    Irregular heart beat Mother    Heart murmur Brother    Pulmonary embolism Brother    Hypertension Other    Pulmonary embolism Maternal Uncle     Social History   Socioeconomic History   Marital status: Married    Spouse name: [name]   Number of children: 3   Years of education: GED   Highest education level: GED or equivalent  Occupational History   Not on file  Tobacco Use   Smoking status: Former    Average packs/day: 0.5 packs/day for 65.0 years (32.5 ttl pk-yrs)    Types: Cigarettes    Start date: 1960   Smokeless tobacco: Never   Tobacco comments:    Resumed smoking, after quit  01/2018    Stopped smoking December 21, 2023  Vaping Use   Vaping status: Never Used  Substance and Sexual Activity   Alcohol use: Not Currently    Alcohol/week: 1.0 standard drink of alcohol    Types: 1 Standard drinks or equivalent per week    Comment: occasionally drinks a margarita   Drug use: No   Sexual activity: Not on file  Other Topics Concern   Not on file  Social History Narrative   Right handed    2-3 cups coffee per day         Social Drivers of Health   Financial Resource Strain: Medium Risk (02/07/2024)   Overall Financial  Resource Strain (CARDIA)    Difficulty of Paying Living Expenses: Somewhat hard  Food Insecurity: Food Insecurity Present (02/07/2024)   Hunger Vital Sign    Worried About Running Out of Food in the Last Year: Sometimes true    Ran Out of Food in the Last Year: Never true  Transportation Needs: No Transportation Needs (02/07/2024)   PRAPARE - Administrator, Civil Service (Medical): No    Lack of Transportation (Non-Medical): No  Physical Activity: Insufficiently Active (02/03/2024)   Exercise Vital Sign    Days of Exercise per Week: 2 days    Minutes of Exercise per Session: 60 min  Stress: Stress Concern Present (02/03/2024)   [employer] of Occupational Health - Occupational Stress Questionnaire    Feeling of Stress : To some extent  Social Connections: Moderately Isolated (02/03/2024)   Social Connection and Isolation Panel [NHANES]    Frequency of Communication with Friends and Family: Three times a week    Frequency of Social Gatherings with Friends and Family: Never    Attends Religious Services: Never    Database administrator or Organizations: No    Attends Banker Meetings: Never    Marital Status: Married  Catering manager Violence: Not At Risk (02/03/2024)   Humiliation, Afraid, Rape, and Kick questionnaire    Fear of Current or Ex-Partner: No    Emotionally Abused: No    Physically Abused: No     Sexually Abused: No     Physical Exam   Vitals:   04/03/24 2256  BP: (!) 169/89  Pulse: 79  Resp: 18  Temp: 97.7 F (36.5 C)  SpO2: 94%    CONSTITUTIONAL: Well-appearing, NAD NEURO/PSYCH:  Alert and oriented x 3, no focal deficits EYES:  eyes equal and reactive ENT/NECK:  no LAD, no JVD CARDIO: Regular rate, well-perfused, normal S1 and S2 PULM:  CTAB no wheezing or rhonchi GI/GU:  non-distended, non-tender MSK/SPINE:  No gross deformities, no edema SKIN:  no rash, atraumatic   *Additional and/or pertinent findings included in MDM below  Diagnostic and Interventional Summary    EKG Interpretation Date/Time:    Ventricular Rate:    PR Interval:    QRS Duration:    QT Interval:    QTC Calculation:   R Axis:      Text Interpretation:         Labs Reviewed  BASIC METABOLIC PANEL WITH GFR - Abnormal; Notable for the following components:      Result Value   CO2 21 (*)    Glucose, Bld 122 (*)    BUN 7 (*)    All other components within normal limits  CBC    No orders to display    Medications  timolol (TIMOPTIC) 0.5 % ophthalmic solution 1 drop (has no administration in time range)  fluorescein ophthalmic strip 1 strip (1 strip Both Eyes Given 04/04/24 0012)  tetracaine (PONTOCAINE) 0.5 % ophthalmic solution 2 drop (2 drops Right Eye Given 04/04/24 0008)     Procedures  /  Critical Care Procedures  ED Course and Medical Decision Making  Initial Impression and Ddx Suspect acute glaucoma based on exam.  The right eye erythema is quite injected, erythematous, some cloudiness and there appears to be a small amount of blood in the anterior chamber, ring of white at the edge of the iris.  Tono-Pen readings suggestive of increased pressure on the side and simply with palpation there seems to be increased pressure.  No signs of corneal abrasion with fluorescein.  Past medical/surgical history that increases complexity of ED encounter: History of right eye blindness  from a stroke  Interpretation of Diagnostics Laboratory and/or imaging options to aid in the diagnosis/care of the patient were considered.  After careful history and physical examination, it was determined that there was no indication for diagnostics at this time.  Patient Reassessment and Ultimate Disposition/Management     Case discussed with Dr. Mason Sole of ophthalmology, likely a secondary acute glaucoma from his prior stroke, possibly neovascular glaucoma which would explain the small amount of blood in the anterior chamber.  Patient actually feeling a lot better after the tetracaine eyedrops.  Will provide timolol drop.  Emergency department, prescriptions for timolol as well as dorzolamide drops, prescription for acetazolamide oral treatment, will see ophthalmology later this morning.  Appropriate for discharge.  Patient management required discussion with the following services or consulting groups:  Ophthalmology  Complexity of Problems Addressed Acute illness or injury that poses threat of life of bodily function  Additional Data Reviewed and Analyzed Further history obtained from: Further history from spouse/family member  Additional Factors Impacting ED Encounter Risk Consideration of hospitalization  Merrick Abe. Harless Lien, MD [facility] Emergency Medicine [facility] [email or web address]  Final Clinical Impressions(s) / ED Diagnoses     ICD-10-CM   1. 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, unspecified glaucoma stage  H40.51X0       ED Discharge Orders          Ordered    timolol (TIMOPTIC) 0.5 % ophthalmic solution  2 times daily        04/04/24 0137    dorzolamide (TRUSOPT) 2 % ophthalmic solution  2 times daily        04/04/24 0137    acetaZOLAMIDE ER (DIAMOX) 500 MG capsule  2 times daily        04/04/24 0137             Discharge Instructions Discussed with and Provided to Patient:    Discharge Instructions      You were  evaluated in the Emergency Department and after careful evaluation, we did not find any emergent condition requiring admission or further testing in the hospital.  Your exam/testing today was overall reassuring.  Symptoms likely due to glaucoma or increased pressure within the right eye.  Dr. Mason Sole is the ophthalmologist we spoke to, he would like to see you in the office at 10 AM this morning.  We are prescribing you timolol drops as well as dorzolamide drops for your eye which should help with the pressure.  We also recommend taking the acetazolamide pills twice daily.  Please return to the Emergency Department if you experience any worsening of your condition.  Thank you for allowing us  to be a part of your care.       Edson Graces, MD 04/04/24 [phone]

## 2024-04-04 NOTE — Discharge Instructions (Addendum)
 You were evaluated in the Emergency Department and after careful evaluation, we did not find any emergent condition requiring admission or further testing in the hospital.  Your exam/testing today was overall reassuring.  Symptoms likely due to glaucoma or increased pressure within the right eye.  Dr. Mason Sole is the ophthalmologist we spoke to, he would like to see you in the office at 10 AM this morning.  We are prescribing you timolol drops as well as dorzolamide drops for your eye which should help with the pressure.  We also recommend taking the acetazolamide pills twice daily.  Please return to the Emergency Department if you experience any worsening of your condition.  Thank you for allowing us  to be a part of your care.

## 2024-04-18 DIAGNOSIS — H4051X3 Glaucoma secondary to other eye disorders, right eye, severe stage: Secondary | ICD-10-CM | POA: Diagnosis not present

## 2024-04-24 ENCOUNTER — Ambulatory Visit (INDEPENDENT_AMBULATORY_CARE_PROVIDER_SITE_OTHER): Payer: Medicare HMO

## 2024-04-24 DIAGNOSIS — I428 Other cardiomyopathies: Secondary | ICD-10-CM | POA: Diagnosis not present

## 2024-04-24 LAB — CUP PACEART REMOTE DEVICE CHECK
Battery Remaining Longevity: 30 mo
Battery Remaining Percentage: 36 %
Battery Voltage: 2.95 V
Date Time Interrogation Session: 20250506020021
Implantable Lead Connection Status: 753985
Implantable Lead Connection Status: 753985
Implantable Lead Implant Date: 20190515
Implantable Lead Implant Date: 20190515
Implantable Lead Location: 753858
Implantable Lead Location: 753860
Implantable Lead Model: 5076
Implantable Pulse Generator Implant Date: 20190515
Lead Channel Impedance Value: 510 Ohm
Lead Channel Impedance Value: 550 Ohm
Lead Channel Pacing Threshold Amplitude: 0.5 V
Lead Channel Pacing Threshold Amplitude: 0.75 V
Lead Channel Pacing Threshold Pulse Width: 0.5 ms
Lead Channel Pacing Threshold Pulse Width: 0.5 ms
Lead Channel Sensing Intrinsic Amplitude: 9.1 mV
Lead Channel Setting Pacing Amplitude: 2 V
Lead Channel Setting Pacing Amplitude: 2.5 V
Lead Channel Setting Pacing Pulse Width: 0.5 ms
Lead Channel Setting Pacing Pulse Width: 0.5 ms
Lead Channel Setting Sensing Sensitivity: 2 mV
Pulse Gen Model: 3562
Pulse Gen Serial Number: 9427031

## 2024-04-25 ENCOUNTER — Ambulatory Visit: Attending: Cardiology

## 2024-04-25 DIAGNOSIS — I5020 Unspecified systolic (congestive) heart failure: Secondary | ICD-10-CM | POA: Diagnosis not present

## 2024-04-25 DIAGNOSIS — Z950 Presence of cardiac pacemaker: Secondary | ICD-10-CM

## 2024-04-25 NOTE — Progress Notes (Unsigned)
 EPIC Encounter for ICM Monitoring  Patient Name: Michael Delacruz is a 83 y.o. male Date: 04/25/2024 Primary Care Physican: Raina Bunting, DO Primary Cardiologist: Jerelene Monday Electrophysiologist: Gareth Junes Pacing: 97%            03/16/2023 Weight: 175-178 lbs 06/27/2023 Weight: 174 lbs 01/12/2024 Weight: 175 lbs                                                           Attempted call to patient and unable to reach.   Transmission results reviewed.    Diet:  Does not follow low salt diet and uses salt at home.     CorVue thoracic impedance suggesting possible fluid accumulation starting 5/1 and returned close to normal 5/7.      Prescribed: Furosemide  40 mg Take 1 tablet (40 mg total) by mouth daily as needed (As needed for weight gain or shortness of breath).   Recommendations:  Unable to reach.     Follow-up plan: ICM clinic phone appointment on 05/28/2024.   91 day device clinic remote transmission 07/24/2024.     EP/Cardiology Office Visits:  04/30/2024 with Dr. Gollan.  Recall 10/24/2024 with Dr Daneil Dunker.    Copy of ICM check sent to Dr. Daneil Dunker.  3 month ICM trend: 04/25/2024.    12-14 Month ICM trend:     Almyra Jain, RN 04/25/2024 7:43 AM

## 2024-04-26 ENCOUNTER — Telehealth: Payer: Self-pay

## 2024-04-26 NOTE — Telephone Encounter (Signed)
 Remote ICM transmission received.  Attempted call to patient regarding ICM remote transmission and no answer.

## 2024-04-29 NOTE — Progress Notes (Deleted)
 Evaluation Performed:  Follow-up visit  Date:  04/29/2024   ID:  Michael Delacruz, DOB 1941/03/23, MRN [id number]  Patient Location:  [address] [place] [address]   Provider location:   [facility]er, [place] office  PCP:  Raina Bunting, DO  Cardiologist:  [facility]  No chief complaint on file.   History of Present Illness:    [name] is a 83 y.o. male  past medical history of long smoking history for 50 years with underlying COPD,  severe mitral valve regurgitation on echocardiogram,  prolapse of posterior leaflet,  moderate pulmonary hypertension  s/p successful MR repair by right thoracotomy by Dr. Alva Jewels 11/ 2016 Smokes 1 ppd PAF on warfarin had bleeding, now on asa, s/p Maze Surgical report indicates clipping of left atrial appendage, Atricure left atrial clip, size 45 mm Previous history of retinal bleeding felt exacerbated by warfarin , requiring surgery 2 residual right eye  vision deficits Atrial flutter EF 40 to 45% Dilated ascending aorta 4.5 cm on CT, unchaged 2023 Who presents for follow up of his MR repair and atrial flutter  LOV 11/23  In general reports that he feels well Chronic vision issues, last site in the right eye Glasses not working  Tax adviser working well, pacer downloads reviewed  Lasix  as needed for SOB,  "Seldom"  Rare episodes of near syncope No certain time, typically when standing BP low on today's visit, has not been checking blood pressure at home  Sedentary, no regular walking or exercise program  EKG personally reviewed by myself on todays visit Paced rhythm rate 88 bpm, PVCs  Past medical history reviewed emergency room Apr 25, 2022 for near syncope, shortness of breath Was in antique store, reports he had been shopping for around 4 hours, had been on his feet, had not had much to drink that day, " I have had 2 bottles of water in the past 3 months" That morning and had a cup of  coffee Symptoms started when he was standing at a table when he had a sudden onset of lightheadedness and felt very hot and sweaty like he was going to pass out. "Could not stand", could not step, felt it before, took over 20 min-30 to recover Diaphoresis after sitting down In the ER CXR 1.29, had not been drinking Not taking much lasix  at baseline  Long smoking history Reports he is still driving but having trouble secondary to chronic vision issues " Almost need to give up and driving"  CT chest: Dilated aortic root, measuring up to 4.5 cm unchanged compared to prior Solid-appearing subcentimeter exophytic lesion of the lower pole of the right kidney, increased in size when compared with prior exam and suspicious for small RCC  Echo 03/2022 reviewed  1. Left ventricular ejection fraction, by estimation, is 45 to 50%. The  left ventricle has mildly decreased function. The left ventricle  demonstrates regional wall motion abnormalities (septal wall dyskinesis  likely from condution abnormality). There is   mild left ventricular hypertrophy. The average left ventricular global  longitudinal strain is -7.8 %. The global longitudinal strain is abnormal.   2. Right ventricular systolic function is normal. The right ventricular  size is normal.   3. Left atrial size was severely dilated.   4. The mitral valve has been repaired/replaced. No evidence of mitral  valve regurgitation. Mild mitral stenosis. The mean mitral valve gradient  is 5.0 mmHg.  There is moderate dilatation of the aortic root, measuring 45 mm.  There is mild dilatation of the ascending aorta, measuring 43 mm. There is  borderline dilatation of the aortic arch, measuring 39 mm.  Lost vision on right, chronic issue Vision on left comes and goes, no rhyme or reason  covid 19 early 2022 associated severe weakness, restlessness, and altered mental status.  Previous fall in the shower at one point and struck the left  side of his upper chest associated with severe pain.  Evaluation in the ER  CT chest 04/29/2021 4.5 cm ascending thoracic aortic aneurysm  Poor appetite, lost 30 pounds  Chronic vision issue No vision in right eye,  Has  CRT-P and AV junction ablation  Previously declined anticoagulation and cardioversion  He was started on amiodarone  as a class IIb indication for rate control   amio has been discontinued because of nightmares.     May 2019 acute systolic heart failure with symptoms of impending doom.   He was in atrial flutter with uncontrolled rate.    underwent CRT-P and AV junction ablation.    Failed Entresto  in the past secondary to orthostasis   Past Medical History:  Diagnosis Date   Anxiety    Arthritis    Ascending aortic aneurysm (HCC)    a. 11/2018 Echo: Ao root 43mm, Asc Ao 41mm; b. 02/2021 Echo: Ao root 47mm; c. 04/2021 CT chest: Asc Ao 4.5cm.   Asthma    Bradycardia    CAD in native artery    a. LHC 09/2015: 40% pCx, 35% mRCA.   Chronic HFrEF (heart failure with reduced ejection fraction) (HCC)    COPD (chronic obstructive pulmonary disease) (HCC)    Dilation of intestine 01/2015   Fibromyalgia    Frequent headaches    GERD (gastroesophageal reflux disease)    History of blood clots    eye    History of hiatal hernia    History of kidney stones    History of rheumatic fever    Hypertension    NICM (nonischemic cardiomyopathy) (HCC)    a. 07/2017 Echo: EF reduced to 40-45%; b. 07/2018 Echo: EF 25-30%; c. 11/2018 Echo: EF 30-35%; d. 02/2021 Echo: EF 45-50%, septal and apical septal HK. Mild MR. Sev dil LA. Mod dil RA. Ao root 47mm.   Permanent atrial fibrillation (HCC)    a. s/p TEE/DCCV 07/2015. b. H/o bleeding on Coumadin  when INR >5, changed to Eliquis -subsequently discontinued; c. 04/2018 s/p SJM 3562 Quadra Allure MP DC PPM & AVN ablation.   S/P Minimally invasive maze operation for atrial fibrillation 10/22/2015   Complete bilateral atrial lesion set using  cryothermy and bipolar radiofrequency ablation with clipping of LA appendage via right mini thoracotomy approach   S/P minimally invasive mitral valve repair 10/22/2015   Complex valvuloplasty including triangular resection of posterior leaflet, artificial Gore-tex neochord placement x6 and 38 mm Sorin Memo 3D Rechord ring annuloplasty via right minithoracotomy approach   Severe mitral regurgitation s/p MVR    a. s/p MV repair 10/2015; b. 11/2018 Echo: Mild MS (mean grad ); d. 02/2021 Echo: Mild MR. Mean grad .   Sleep apnea    no longer uses cpap and doesn't use O2 at home   Stroke [facility])     no vision in right, on occasion sees a pinpoint    Thyroid  disorder    TIA (transient ischemic attack)    Tobacco abuse    Past Surgical History:  Procedure Laterality Date  ANKLE SURGERY     AV NODE ABLATION N/A 05/04/2018   Procedure: AV NODE ABLATION;  Surgeon: Jolly Needle, MD;  Location: [facility] INVASIVE CV LAB;  Service: Cardiovascular;  Laterality: N/A;   BIV PACEMAKER INSERTION CRT-P N/A 05/03/2018   Procedure: BIV PACEMAKER INSERTION CRT-P;  Surgeon: Verona Goodwill, MD;  Location: [facility] INVASIVE CV LAB;  Service: Cardiovascular;  Laterality: N/A;   CARDIAC CATHETERIZATION N/A 10/03/2015   Procedure: Right and Left Heart Cath and Coronary Angiography;  Surgeon: Devorah Fonder, MD;  Location: [facility] INVASIVE CV LAB;  Service: Cardiovascular;  Laterality: N/A;   COLONOSCOPY     ELECTROPHYSIOLOGIC STUDY N/A 08/18/2015   Procedure: CARDIOVERSION;  Surgeon: Wenona Hamilton, MD;  Location: [facility] ORS;  Service: Cardiovascular;  Laterality: N/A;   MASS EXCISION N/A 03/04/2021   Procedure: EXCISION INNER NASAL CANCER;  Surgeon: Prescott Brodie, MD;  Location: [facility] OR;  Service: ENT;  Laterality: N/A;   MINIMALLY INVASIVE MAZE PROCEDURE N/A 10/22/2015   Procedure: MINIMALLY INVASIVE MAZE PROCEDURE;  Surgeon: Gardenia Jump, MD;  Location: [facility] OR;  Service: Open Heart Surgery;  Laterality: N/A;    MITRAL VALVE REPAIR Right 10/22/2015   Procedure: MINIMALLY INVASIVE MITRAL VALVE REPAIR (MVR);  Surgeon: Gardenia Jump, MD;  Location: [facility] OR;  Service: Open Heart Surgery;  Laterality: Right;   ORIF FEMUR FRACTURE Left 12/22/2023   Procedure: OPEN REDUCTION INTERNAL FIXATION (ORIF) DISTAL FEMUR FRACTURE;  Surgeon: Wilhelmenia Harada, MD;  Location: [facility] ORS;  Service: Orthopedics;  Laterality: Left;  NOT HIS CARD Zimmer distal plate requested- rep aware   SINUS EXPLORATION     SKIN FULL THICKNESS GRAFT N/A 03/04/2021   Procedure: SKIN GRAFT FULL THICKNESS;  Surgeon: Prescott Brodie, MD;  Location: [facility] OR;  Service: ENT;  Laterality: N/A;   TEE WITHOUT CARDIOVERSION N/A 08/18/2015   Procedure: TRANSESOPHAGEAL ECHOCARDIOGRAM (TEE);  Surgeon: Wenona Hamilton, MD;  Location: [facility] ORS;  Service: Cardiovascular;  Laterality: N/A;   TEE WITHOUT CARDIOVERSION N/A 10/22/2015   Procedure: TRANSESOPHAGEAL ECHOCARDIOGRAM (TEE);  Surgeon: Gardenia Jump, MD;  Location: [facility] OR;  Service: Open Heart Surgery;  Laterality: N/A;     No outpatient medications have been marked as taking for the 04/30/24 encounter (Appointment) with Rodnisha Blomgren J, MD.     Allergies:   Codeine, Digoxin  and related, Macrodantin [nitrofurantoin macrocrystal], and Morphine  and codeine   Social History   Tobacco Use   Smoking status: Former    Average packs/day: 0.5 packs/day for 65.0 years (32.5 ttl pk-yrs)    Types: Cigarettes    Start date: 1960   Smokeless tobacco: Never   Tobacco comments:    Resumed smoking, after quit 01/2018    Stopped smoking December 21, 2023  Vaping Use   Vaping status: Never Used  Substance Use Topics   Alcohol use: Not Currently    Alcohol/week: 1.0 standard drink of alcohol    Types: 1 Standard drinks or equivalent per week    Comment: occasionally drinks a margarita   Drug use: No     Family Hx: The patient's family history includes Heart murmur in his brother; Hypertension in an other  family member; Irregular heart beat in his mother; Pulmonary embolism in his brother and maternal uncle; Stroke in his mother.  ROS:   Please see the history of present illness.    Review of Systems  Constitutional: Negative.   HENT: Negative.    Eyes:  Vision deficits  Respiratory: Negative.    Cardiovascular: Negative.   Gastrointestinal: Negative.   Musculoskeletal: Negative.        Leg weakness  Neurological:  Positive for dizziness.  Psychiatric/Behavioral: Negative.    All other systems reviewed and are negative.    Labs/Other Tests and Data Reviewed:    Recent Labs: 01/27/2024: B Natriuretic Peptide 52.4 04/03/2024: BUN 7; Creatinine, Ser 1.19; Hemoglobin 14.6; Platelets 157; Potassium 3.8; Sodium 139   Recent Lipid Panel Lab Results  Component Value Date/Time   CHOL 215 (H) 05/01/2018 10:23 AM   TRIG 79 05/01/2018 10:23 AM   HDL 42 05/01/2018 10:23 AM   CHOLHDL 5.1 05/01/2018 10:23 AM   LDLCALC 157 (H) 05/01/2018 10:23 AM    Wt Readings from Last 3 Encounters:  04/03/24 180 lb (81.6 kg)  01/27/24 175 lb (79.4 kg)  12/22/23 176 lb 12.9 oz (80.2 kg)     Exam:    Vital Signs: Vital signs may also be detailed in the HPI There were no vitals taken for this visit.  Constitutional:  oriented to person, place, and time. No distress.  HENT:  Head: Grossly normal Eyes:  no discharge. No scleral icterus.  Neck: No JVD, no carotid bruits  Cardiovascular: Regular rate and rhythm, no murmurs appreciated Pulmonary/Chest: Clear to auscultation bilaterally, no wheezes or rails Abdominal: Soft.  no distension.  no tenderness.  Musculoskeletal: Normal range of motion Neurological:  normal muscle tone. Coordination normal. No atrophy Skin: Skin warm and dry Psychiatric: normal affect, pleasant  ASSESSMENT & PLAN:    Orthostasis Prior episodes of orthostasis  Recommend he try to avoid using his Lasix , stay hydrated Monitor blood pressure at home, check  orthostatics at home Paced rhythm, no significant arrhythmia picked up on pacer download  Chronic atrial fibrillation Does not want anticoagultion, previously very concerned about bleeding in his eye Prior appendage clipping during surgery Prior AV node ablation with pacer,  Followed by EP  Complete heart block (HCC) paced rhythm, followed by EP  NICM (nonischemic cardiomyopathy) (HCC) EF 30 to 35% in 11/2018 up to 45 to 50% in 02/2021 and April 2023  stable  mitral valve repair Previously declined medication changes  Atrial flutter, unspecified type (HCC) Previous TIA/strokes Does not want anticoagulation  Dilated aorta aortic ascending aorta is 4.5 cm, on CT scan May 2022 Aorta unchanged on repeat imaging 2023    Total encounter time more than 30 minutes  Greater than 50% was spent in counseling and coordination of care with the patient    Signed, Belva Boyden, MD  04/29/2024 8:08 PM    [facility] [address], [place], [place] [address]

## 2024-04-30 ENCOUNTER — Ambulatory Visit: Admitting: Cardiovascular Disease

## 2024-04-30 DIAGNOSIS — Z9889 Other specified postprocedural states: Secondary | ICD-10-CM

## 2024-04-30 DIAGNOSIS — I5022 Chronic systolic (congestive) heart failure: Secondary | ICD-10-CM

## 2024-04-30 DIAGNOSIS — I272 Pulmonary hypertension, unspecified: Secondary | ICD-10-CM

## 2024-04-30 DIAGNOSIS — I428 Other cardiomyopathies: Secondary | ICD-10-CM

## 2024-04-30 DIAGNOSIS — I442 Atrioventricular block, complete: Secondary | ICD-10-CM

## 2024-04-30 DIAGNOSIS — Z950 Presence of cardiac pacemaker: Secondary | ICD-10-CM

## 2024-04-30 DIAGNOSIS — I7121 Aneurysm of the ascending aorta, without rupture: Secondary | ICD-10-CM

## 2024-04-30 DIAGNOSIS — R0789 Other chest pain: Secondary | ICD-10-CM

## 2024-04-30 DIAGNOSIS — I340 Nonrheumatic mitral (valve) insufficiency: Secondary | ICD-10-CM

## 2024-04-30 DIAGNOSIS — I5020 Unspecified systolic (congestive) heart failure: Secondary | ICD-10-CM

## 2024-04-30 DIAGNOSIS — I4821 Permanent atrial fibrillation: Secondary | ICD-10-CM

## 2024-05-02 ENCOUNTER — Ambulatory Visit: Payer: Self-pay | Admitting: Cardiology

## 2024-05-09 DIAGNOSIS — H348111 Central retinal vein occlusion, right eye, with retinal neovascularization: Secondary | ICD-10-CM | POA: Diagnosis not present

## 2024-05-28 ENCOUNTER — Ambulatory Visit: Attending: Cardiology

## 2024-05-28 DIAGNOSIS — I5020 Unspecified systolic (congestive) heart failure: Secondary | ICD-10-CM | POA: Diagnosis not present

## 2024-05-28 DIAGNOSIS — Z950 Presence of cardiac pacemaker: Secondary | ICD-10-CM

## 2024-05-30 NOTE — Progress Notes (Signed)
 EPIC Encounter for ICM Monitoring  Patient Name: Michael Delacruz is a 83 y.o. male Date: 05/30/2024 Primary Care Physican: Raina Bunting, DO Primary Cardiologist: Jerelene Monday Electrophysiologist: Gareth Junes Pacing: 97%            03/16/2023 Weight: 175-178 lbs 06/27/2023 Weight: 174 lbs 01/12/2024 Weight: 175 lbs                                                           Transmission results reviewed.    Diet:  Does not follow low salt diet and uses salt at home.     CorVue thoracic impedance suggesting normal fluid levels within the last month.      Prescribed: Furosemide  40 mg Take 1 tablet (40 mg total) by mouth daily as needed   Recommendations:  No changes.    Follow-up plan: ICM clinic phone appointment on 07/16/2024.   91 day device clinic remote transmission 07/24/2024.     EP/Cardiology Office Visits:  06/15/2024 with Dr. Gollan.  Recall 10/24/2024 with Dr Daneil Dunker.    Copy of ICM check sent to Dr. Daneil Dunker.  3 month ICM trend: 05/28/2024.    12-14 Month ICM trend:     Almyra Jain, RN 05/30/2024 10:18 AM

## 2024-06-06 DIAGNOSIS — H348111 Central retinal vein occlusion, right eye, with retinal neovascularization: Secondary | ICD-10-CM | POA: Diagnosis not present

## 2024-06-06 DIAGNOSIS — H4311 Vitreous hemorrhage, right eye: Secondary | ICD-10-CM | POA: Diagnosis not present

## 2024-06-06 NOTE — Progress Notes (Signed)
 Remote pacemaker transmission.

## 2024-06-12 NOTE — Progress Notes (Signed)
 Evaluation Performed:  Follow-up visit  Date:  06/15/2024   ID:  Michael Delacruz, DOB 02/08/1941, MRN [id number]  Patient Location:  [address] [place] [address]   Provider location:   Delac[facility] Nicolas, [place] office  PCP:  Michael Marsa PARAS, DO  Cardiologist:  Michael Delacruz [facility]  Chief Complaint  Patient presents with   Follow-up    Denies cardiac symptoms.    History of Present Illness:    Michael Delacruz is a 83 y.o. male  past medical history of long smoking history for 50 years with underlying COPD,  severe mitral valve regurgitation on echocardiogram,  prolapse of posterior leaflet,  moderate pulmonary hypertension  s/p successful MR repair by right thoracotomy by Dr. Dusty 11/ 2016 Smokes 1 ppd PAF on warfarin had bleeding, now on asa, s/p Maze Surgical report indicates clipping of left atrial appendage,  left atrial clip, size 45 mm Previous history of retinal bleeding felt exacerbated by warfarin , requiring surgery 2 residual right eye  vision deficits Atrial flutter EF 40 to 45% Prior AV node ablation with pacer,  Dilated ascending aorta 4.5 cm on CT, unchaged 2023 Who presents for follow up of his MR repair and atrial flutter  LOV 11/23 In general reports that he feels well  Fall new years day 2025, leg fracture on left Left distal femur open reduction internal fixation Followed by orthopedics   Pacer downloads reviewed Optivol levels indicating normal fluid level June 2025 Pacer working well, BiV paced, underlying atrial flutter Breathing stable, rare Lasix  use  Chronic vision issues, lost site in the right eye Meeting with ophthalmology to determine if he should have bioprosthesis placed given chronic pain/headaches Still driving  Denies significant chest pain rare chest pain on left, typically presents at rest like when laying in bed,  Relieved with drinking fluids  EKG personally reviewed by myself on todays  visit EKG Interpretation Date/Time:  Friday June 15 2024 08:40:01 EDT Ventricular Rate:  81 PR Interval:    QRS Duration:  156 QT Interval:  448 QTC Calculation: 520 R Axis:   -51  Text Interpretation: Ventricular-paced rhythm Biventricular pacemaker detected When compared with ECG of 27-Jan-2024 21:06, No significant change was found Confirmed by Michael Lye [phone]) on 06/15/2024 8:41:12 AM   Past medical history reviewed emergency room Apr 25, 2022 for near syncope, shortness of breath Was in antique store, reports he had been shopping for around 4 hours, had been on his feet, had not had much to drink that day,  I have had 2 bottles of water in the past 3 months That morning and had a cup of coffee Symptoms started when he was standing at a table when he had a sudden onset of lightheadedness and felt very hot and sweaty like he was going to pass out. Could not stand, could not step, felt it before, took over 20 min-30 to recover Diaphoresis after sitting down In the ER CXR 1.29, had not been drinking Not taking much lasix  at baseline  Long smoking history Reports he is still driving but having trouble secondary to chronic vision issues  Almost need to give up and driving  CT chest: Dilated aortic root, measuring up to 4.5 cm unchanged compared to prior Solid-appearing subcentimeter exophytic lesion of the lower pole of the right kidney, increased in size when compared with prior exam and suspicious for small RCC  Echo 03/2022 reviewed  1. Left ventricular ejection  fraction, by estimation, is 45 to 50%. The  left ventricle has mildly decreased function. The left ventricle  demonstrates regional wall motion abnormalities (septal wall dyskinesis  likely from condution abnormality). There is   mild left ventricular hypertrophy. The average left ventricular global  longitudinal strain is -7.8 %. The global longitudinal strain is abnormal.   2. Right ventricular systolic  function is normal. The right ventricular  size is normal.   3. Left atrial size was severely dilated.   4. The mitral valve has been repaired/replaced. No evidence of mitral  valve regurgitation. Mild mitral stenosis. The mean mitral valve gradient  is 5.0 mmHg.    There is moderate dilatation of the aortic root, measuring 45 mm.  There is mild dilatation of the ascending aorta, measuring 43 mm. There is  borderline dilatation of the aortic arch, measuring 39 mm.  Lost vision on right, chronic issue Vision on left comes and goes, no rhyme or reason  covid 19 early 2022 associated severe weakness, restlessness, and altered mental status.  Previous fall in the shower at one point and struck the left side of his upper chest associated with severe pain.  Evaluation in the ER  CT chest 04/29/2021 4.5 cm ascending thoracic aortic aneurysm  Poor appetite, lost 30 pounds  Chronic vision issue No vision in right eye,  Has  CRT-P and AV junction ablation  Previously declined anticoagulation and cardioversion  He was started on amiodarone  as a class IIb indication for rate control   amio has been discontinued because of nightmares.     May 2019 acute systolic heart failure with symptoms of impending doom.   He was in atrial flutter with uncontrolled rate.    underwent CRT-P and AV junction ablation.    Failed Entresto  in the past secondary to orthostasis   Past Medical History:  Diagnosis Date   Anxiety    Arthritis    Ascending aortic aneurysm (HCC)    a. 11/2018 Echo: Ao root 43mm, Asc Ao 41mm; b. 02/2021 Echo: Ao root 47mm; c. 04/2021 CT chest: Asc Ao 4.5cm.   Asthma    Bradycardia    CAD in native artery    a. LHC 09/2015: 40% pCx, 35% mRCA.   Chronic HFrEF (heart failure with reduced ejection fraction) (HCC)    COPD (chronic obstructive pulmonary disease) (HCC)    Dilation of intestine 01/2015   Fibromyalgia    Frequent headaches    GERD (gastroesophageal reflux disease)     History of blood clots    eye    History of hiatal hernia    History of kidney stones    History of rheumatic fever    Hypertension    NICM (nonischemic cardiomyopathy) (HCC)    a. 07/2017 Echo: EF reduced to 40-45%; b. 07/2018 Echo: EF 25-30%; c. 11/2018 Echo: EF 30-35%; d. 02/2021 Echo: EF 45-50%, septal and apical septal HK. Mild MR. Sev dil LA. Mod dil RA. Ao root 47mm.   Permanent atrial fibrillation (HCC)    a. s/p TEE/DCCV 07/2015. b. H/o bleeding on Coumadin  when INR >5, changed to Eliquis -subsequently discontinued; c. 04/2018 s/p SJM 3562 Quadra Allure MP DC PPM & AVN ablation.   S/P Minimally invasive maze operation for atrial fibrillation 10/22/2015   Complete bilateral atrial lesion set using cryothermy and bipolar radiofrequency ablation with clipping of LA appendage via right mini thoracotomy approach   S/P minimally invasive mitral valve repair 10/22/2015   Complex valvuloplasty including triangular resection of  posterior leaflet, artificial Gore-tex neochord placement x6 and 38 mm Sorin Memo 3D Rechord ring annuloplasty via right minithoracotomy approach   Severe mitral regurgitation s/p MVR    a. s/p MV repair 10/2015; b. 11/2018 Echo: Mild MS (mean grad ); d. 02/2021 Echo: Mild MR. Mean grad .   Sleep apnea    no longer uses cpap and doesn't use O2 at home   Stroke [facility])     no vision in right, on occasion sees a pinpoint    Thyroid  disorder    TIA (transient ischemic attack)    Tobacco abuse    Past Surgical History:  Procedure Laterality Date   ANKLE SURGERY     AV NODE ABLATION N/A 05/04/2018   Procedure: AV NODE ABLATION;  Surgeon: Kelsie Agent, MD;  Location: [facility] INVASIVE CV LAB;  Service: Cardiovascular;  Laterality: N/A;   BIV PACEMAKER INSERTION CRT-P N/A 05/03/2018   Procedure: BIV PACEMAKER INSERTION CRT-P;  Surgeon: Fernande Elspeth BROCKS, MD;  Location: [facility] INVASIVE CV LAB;  Service: Cardiovascular;  Laterality: N/A;   CARDIAC CATHETERIZATION N/A 10/03/2015    Procedure: Right and Left Heart Cath and Coronary Angiography;  Surgeon: Evalene JINNY Lunger, MD;  Location: [facility] INVASIVE CV LAB;  Service: Cardiovascular;  Laterality: N/A;   COLONOSCOPY     ELECTROPHYSIOLOGIC STUDY N/A 08/18/2015   Procedure: CARDIOVERSION;  Surgeon: Deatrice DELENA Cage, MD;  Location: [facility] ORS;  Service: Cardiovascular;  Laterality: N/A;   KNEE SURGERY Left    MASS EXCISION N/A 03/04/2021   Procedure: EXCISION INNER NASAL CANCER;  Surgeon: Ethyl Lonni BRAVO, MD;  Location: [facility] OR;  Service: ENT;  Laterality: N/A;   MINIMALLY INVASIVE MAZE PROCEDURE N/A 10/22/2015   Procedure: MINIMALLY INVASIVE MAZE PROCEDURE;  Surgeon: Sudie VEAR Laine, MD;  Location: [facility] OR;  Service: Open Heart Surgery;  Laterality: N/A;   MITRAL VALVE REPAIR Right 10/22/2015   Procedure: MINIMALLY INVASIVE MITRAL VALVE REPAIR (MVR);  Surgeon: Sudie VEAR Laine, MD;  Location: [facility] OR;  Service: Open Heart Surgery;  Laterality: Right;   ORIF FEMUR FRACTURE Left 12/22/2023   Procedure: OPEN REDUCTION INTERNAL FIXATION (ORIF) DISTAL FEMUR FRACTURE;  Surgeon: Genelle Elspeth, MD;  Location: [facility] ORS;  Service: Orthopedics;  Laterality: Left;  NOT HIS CARD Zimmer distal plate requested- rep aware   SINUS EXPLORATION     SKIN FULL THICKNESS GRAFT N/A 03/04/2021   Procedure: SKIN GRAFT FULL THICKNESS;  Surgeon: Ethyl Lonni BRAVO, MD;  Location: [facility] OR;  Service: ENT;  Laterality: N/A;   TEE WITHOUT CARDIOVERSION N/A 08/18/2015   Procedure: TRANSESOPHAGEAL ECHOCARDIOGRAM (TEE);  Surgeon: Deatrice DELENA Cage, MD;  Location: [facility] ORS;  Service: Cardiovascular;  Laterality: N/A;   TEE WITHOUT CARDIOVERSION N/A 10/22/2015   Procedure: TRANSESOPHAGEAL ECHOCARDIOGRAM (TEE);  Surgeon: Sudie VEAR Laine, MD;  Location: [facility] OR;  Service: Open Heart Surgery;  Laterality: N/A;     Current Meds  Medication Sig   ALPRAZolam  (XANAX ) 0.25 MG tablet Take 1 tablet (0.25 mg total) by mouth 2 (two) times daily as needed for anxiety.    dorzolamide  (TRUSOPT ) 2 % ophthalmic solution Place 1 drop into the right eye 2 (two) times daily.   furosemide  (LASIX ) 40 MG tablet Take 40 mg by mouth daily. As needed   timolol  (TIMOPTIC ) 0.5 % ophthalmic solution Place 1 drop into the right eye 2 (two) times daily.   traZODone  (DESYREL ) 100 MG tablet Take 1 tablet (100 mg total) by mouth at bedtime.     Allergies:  Codeine, Digoxin  and related, Macrodantin [nitrofurantoin macrocrystal], and Morphine  and codeine   Social History   Tobacco Use   Smoking status: Every Day    Current packs/day: 0.50    Average packs/day: 0.5 packs/day for 65.5 years (32.7 ttl pk-yrs)    Types: Cigarettes    Start date: 1960   Smokeless tobacco: Never   Tobacco comments:    Resumed smoking, after quit 01/2018    Stopped smoking December 21, 2023  Vaping Use   Vaping status: Never Used  Substance Use Topics   Alcohol use: Not Currently    Alcohol/week: 1.0 standard drink of alcohol    Types: 1 Standard drinks or equivalent per week    Comment: occasionally drinks a margarita   Drug use: No     Family Hx: The patient's family history includes Heart murmur in his brother; Hypertension in an other family member; Irregular heart beat in his mother; Pulmonary embolism in his brother and maternal uncle; Stroke in his mother.  ROS:   Please see the history of present illness.    Review of Systems  Constitutional: Negative.   HENT: Negative.    Eyes:        Vision deficits  Respiratory: Negative.    Cardiovascular: Negative.   Gastrointestinal: Negative.   Musculoskeletal: Negative.        Leg weakness  Neurological: Negative.   Psychiatric/Behavioral: Negative.    All other systems reviewed and are negative.    Labs/Other Tests and Data Reviewed:    Recent Labs: 01/27/2024: B Natriuretic Peptide 52.4 04/03/2024: BUN 7; Creatinine, Ser 1.19; Hemoglobin 14.6; Platelets 157; Potassium 3.8; Sodium 139   Recent Lipid Panel Lab Results   Component Value Date/Time   CHOL 215 (H) 05/01/2018 10:23 AM   TRIG 79 05/01/2018 10:23 AM   HDL 42 05/01/2018 10:23 AM   CHOLHDL 5.1 05/01/2018 10:23 AM   LDLCALC 157 (H) 05/01/2018 10:23 AM    Wt Readings from Last 3 Encounters:  06/15/24 180 lb 12.8 oz (82 kg)  04/03/24 180 lb (81.6 kg)  01/27/24 175 lb (79.4 kg)     Exam:    Vital Signs: Vital signs may also be detailed in the HPI BP 110/70   Pulse 81   Ht 6' (1.829 m)   Wt 180 lb 12.8 oz (82 kg)   SpO2 98%   BMI 24.52 kg/m   Constitutional:  oriented to person, place, and time. No distress.  HENT:  Head: Grossly normal Eyes:  no discharge. No scleral icterus.  Neck: No JVD, no carotid bruits  Cardiovascular: Regular rate and rhythm, no murmurs appreciated Pulmonary/Chest: Clear to auscultation bilaterally, no wheezes or rails Abdominal: Soft.  no distension.  no tenderness.  Musculoskeletal: Normal range of motion Neurological:  normal muscle tone. Coordination normal. No atrophy Skin: Skin warm and dry Psychiatric: normal affect, pleasant  ASSESSMENT & PLAN:    Orthostasis Denies symptoms of orthostasis, no dizziness  Chronic atrial fibrillation/flutter Does not want anticoagultion,  Prior appendage clipping during mitral valve surgery Prior AV node ablation with pacer,  Followed by EP  Complete heart block (HCC) paced rhythm, followed by EP  NICM (nonischemic cardiomyopathy) (HCC) EF 30 to 35% in 11/2018 up to 45 to 50% in 02/2021 and April 2023  stable  mitral valve repair Previously declined medication changes  Atrial flutter, unspecified type (HCC) Previous TIA/strokes Does not want anticoagulation  Dilated aorta aortic ascending aorta is 4.5 cm, on CT scan May 2022 Aorta  unchanged on repeat imaging 2023 Stable January 2025    Signed, Evalene Lunger, MD  06/15/2024 9:15 AM    [facility] [address], [place], [place] [address]

## 2024-06-13 DIAGNOSIS — H4051X3 Glaucoma secondary to other eye disorders, right eye, severe stage: Secondary | ICD-10-CM | POA: Diagnosis not present

## 2024-06-13 DIAGNOSIS — H348111 Central retinal vein occlusion, right eye, with retinal neovascularization: Secondary | ICD-10-CM | POA: Diagnosis not present

## 2024-06-15 ENCOUNTER — Encounter: Payer: Self-pay | Admitting: Cardiovascular Disease

## 2024-06-15 ENCOUNTER — Ambulatory Visit: Attending: Cardiovascular Disease | Admitting: Cardiovascular Disease

## 2024-06-15 VITALS — BP 110/70 | HR 81 | Ht 72.0 in | Wt 180.8 lb

## 2024-06-15 DIAGNOSIS — R0789 Other chest pain: Secondary | ICD-10-CM

## 2024-06-15 DIAGNOSIS — I4821 Permanent atrial fibrillation: Secondary | ICD-10-CM

## 2024-06-15 DIAGNOSIS — I5020 Unspecified systolic (congestive) heart failure: Secondary | ICD-10-CM

## 2024-06-15 DIAGNOSIS — I5022 Chronic systolic (congestive) heart failure: Secondary | ICD-10-CM

## 2024-06-15 DIAGNOSIS — Z950 Presence of cardiac pacemaker: Secondary | ICD-10-CM

## 2024-06-15 DIAGNOSIS — I428 Other cardiomyopathies: Secondary | ICD-10-CM

## 2024-06-15 DIAGNOSIS — I340 Nonrheumatic mitral (valve) insufficiency: Secondary | ICD-10-CM | POA: Diagnosis not present

## 2024-06-15 DIAGNOSIS — I7121 Aneurysm of the ascending aorta, without rupture: Secondary | ICD-10-CM

## 2024-06-15 DIAGNOSIS — I442 Atrioventricular block, complete: Secondary | ICD-10-CM

## 2024-06-15 DIAGNOSIS — I272 Pulmonary hypertension, unspecified: Secondary | ICD-10-CM

## 2024-06-15 DIAGNOSIS — Z9889 Other specified postprocedural states: Secondary | ICD-10-CM | POA: Diagnosis not present

## 2024-06-15 NOTE — Patient Instructions (Signed)

## 2024-07-13 ENCOUNTER — Other Ambulatory Visit: Payer: Self-pay | Admitting: Family Medicine

## 2024-07-13 NOTE — Telephone Encounter (Signed)
 Copied from CRM [id number]. Topic: Clinical - Medication Refill >> Jul 13, 2024  3:09 PM Turkey B wrote: Medication: ALPRAZolam  (XANAX ) 0.25 MG tablet  Has the patient contacted their pharmacy? No, says no refills, so thought they had to call in   This is the patient's preferred pharmacy:  [id number] - [place], [place] - [address] [place] [address] Phone: [phone] Fax: [phone]  Is this the correct pharmacy for this prescription? yes   Has the prescription been filled recently? no Is the patient out of the medication? yes  Has the patient been seen for an appointment in the last year OR does the patient have an upcoming appointment? yes e Can we respond through MyChart? yes  Agent: Please be advised that Rx refills may take up to 3 business days. We ask that you follow-up with your pharmacy.

## 2024-07-16 ENCOUNTER — Ambulatory Visit: Attending: Cardiology

## 2024-07-16 DIAGNOSIS — Z950 Presence of cardiac pacemaker: Secondary | ICD-10-CM

## 2024-07-16 DIAGNOSIS — I5020 Unspecified systolic (congestive) heart failure: Secondary | ICD-10-CM

## 2024-07-19 NOTE — Progress Notes (Signed)
 EPIC Encounter for ICM Monitoring  Patient Name: KDYN VONBEHREN is a 83 y.o. male Date: 07/19/2024 Primary Care Physican: Edman Marsa PARAS, DO Primary Cardiologist: Perla Electrophysiologist: Kennyth Pore Pacing: 98%            03/16/2023 Weight: 175-178 lbs 06/27/2023 Weight: 174 lbs 06/15/2024 Office Weight: 118 lbs                                                           Transmission results reviewed.    Diet:  Does not follow low salt diet and uses salt at home.     CorVue thoracic impedance suggesting normal fluid levels within the last month.      Prescribed: Furosemide  40 mg Take 1 tablet (40 mg total) by mouth daily as needed   Recommendations:  No changes.    Follow-up plan: ICM clinic phone appointment on 08/27/2024.   91 day device clinic remote transmission 07/24/2024.     EP/Cardiology Office Visits:  Recall 06/15/2025 with Dr. Gollan.  Recall 11/06/2025 with Dr Kennyth.    Copy of ICM check sent to Dr. Kennyth.  3 month ICM trend: 07/16/2024.    12-14 Month ICM trend:     Mitzie GORMAN Garner, RN 07/19/2024 3:53 PM

## 2024-07-24 ENCOUNTER — Ambulatory Visit (INDEPENDENT_AMBULATORY_CARE_PROVIDER_SITE_OTHER): Payer: Medicare HMO

## 2024-07-24 DIAGNOSIS — I428 Other cardiomyopathies: Secondary | ICD-10-CM

## 2024-07-25 ENCOUNTER — Other Ambulatory Visit: Payer: Self-pay | Admitting: Family Medicine

## 2024-07-25 DIAGNOSIS — F419 Anxiety disorder, unspecified: Secondary | ICD-10-CM

## 2024-07-25 DIAGNOSIS — F5104 Psychophysiologic insomnia: Secondary | ICD-10-CM

## 2024-07-25 LAB — CUP PACEART REMOTE DEVICE CHECK
Battery Remaining Longevity: 28 mo
Battery Remaining Percentage: 33 %
Battery Voltage: 2.95 V
Date Time Interrogation Session: 20250804020021
Implantable Lead Connection Status: 753985
Implantable Lead Connection Status: 753985
Implantable Lead Implant Date: 20190515
Implantable Lead Implant Date: 20190515
Implantable Lead Location: 753858
Implantable Lead Location: 753860
Implantable Lead Model: 5076
Implantable Pulse Generator Implant Date: 20190515
Lead Channel Impedance Value: 480 Ohm
Lead Channel Impedance Value: 550 Ohm
Lead Channel Pacing Threshold Amplitude: 0.5 V
Lead Channel Pacing Threshold Amplitude: 0.75 V
Lead Channel Pacing Threshold Pulse Width: 0.5 ms
Lead Channel Pacing Threshold Pulse Width: 0.5 ms
Lead Channel Sensing Intrinsic Amplitude: 10.2 mV
Lead Channel Setting Pacing Amplitude: 2 V
Lead Channel Setting Pacing Amplitude: 2.5 V
Lead Channel Setting Pacing Pulse Width: 0.5 ms
Lead Channel Setting Pacing Pulse Width: 0.5 ms
Lead Channel Setting Sensing Sensitivity: 2 mV
Pulse Gen Model: 3562
Pulse Gen Serial Number: 9427031

## 2024-07-26 ENCOUNTER — Ambulatory Visit: Payer: Self-pay | Admitting: Cardiology

## 2024-07-27 NOTE — Telephone Encounter (Signed)
 Requested Prescriptions  Pending Prescriptions Disp Refills   traZODone  (DESYREL ) 100 MG tablet [Pharmacy Med Name: TRAZODONE  100 MG TABLET] 90 tablet 0    Sig: TAKE 1 TABLET BY MOUTH EVERYDAY AT BEDTIME     Psychiatry: Antidepressants - Serotonin Modulator Passed - 07/27/2024 10:56 AM      Passed - Completed PHQ-2 or PHQ-9 in the last 360 days      Passed - Valid encounter within last 6 months    Recent Outpatient Visits   None             ALPRAZolam  (XANAX ) 0.25 MG tablet [Pharmacy Med Name: ALPRAZOLAM  0.25 MG TABLET] 60 tablet 0    Sig: TAKE 1 TABLET BY MOUTH 2 TIMES DAILY AS NEEDED FOR ANXIETY.     Not Delegated - Psychiatry: Anxiolytics/Hypnotics 2 Failed - 07/27/2024 10:56 AM      Failed - This refill cannot be delegated      Failed - Urine Drug Screen completed in last 360 days      Passed - Patient is not pregnant      Passed - Valid encounter within last 6 months    Recent Outpatient Visits   None

## 2024-07-27 NOTE — Telephone Encounter (Signed)
 Requested medications are due for refill today.  yes  Requested medications are on the active medications list.  yes  Last refill. 01/30/2024 #60 0 rf  Future visit scheduled.   yes  Notes to clinic.  Refill not delegated.    Requested Prescriptions  Pending Prescriptions Disp Refills   ALPRAZolam  (XANAX ) 0.25 MG tablet [Pharmacy Med Name: ALPRAZOLAM  0.25 MG TABLET] 60 tablet 0    Sig: TAKE 1 TABLET BY MOUTH 2 TIMES DAILY AS NEEDED FOR ANXIETY.     Not Delegated - Psychiatry: Anxiolytics/Hypnotics 2 Failed - 07/27/2024 10:56 AM      Failed - This refill cannot be delegated      Failed - Urine Drug Screen completed in last 360 days      Passed - Patient is not pregnant      Passed - Valid encounter within last 6 months    Recent Outpatient Visits   None            Signed Prescriptions Disp Refills   traZODone  (DESYREL ) 100 MG tablet 90 tablet 0    Sig: TAKE 1 TABLET BY MOUTH EVERYDAY AT BEDTIME     Psychiatry: Antidepressants - Serotonin Modulator Passed - 07/27/2024 10:56 AM      Passed - Completed PHQ-2 or PHQ-9 in the last 360 days      Passed - Valid encounter within last 6 months    Recent Outpatient Visits   None

## 2024-08-08 ENCOUNTER — Ambulatory Visit: Admitting: Family Medicine

## 2024-08-27 ENCOUNTER — Ambulatory Visit: Attending: Cardiology

## 2024-08-27 DIAGNOSIS — Z950 Presence of cardiac pacemaker: Secondary | ICD-10-CM

## 2024-08-27 DIAGNOSIS — I5020 Unspecified systolic (congestive) heart failure: Secondary | ICD-10-CM | POA: Diagnosis not present

## 2024-08-28 NOTE — Progress Notes (Signed)
 EPIC Encounter for ICM Monitoring  Patient Name: Michael Delacruz is a 83 y.o. male Date: 08/28/2024 Primary Care Physican: Edman Marsa PARAS, DO Primary Cardiologist: Perla Electrophysiologist: Kennyth Pore Pacing: 98%            03/16/2023 Weight: 175-178 lbs 06/27/2023 Weight: 174 lbs 06/15/2024 Office Weight: 118 lbs                                                           Transmission results reviewed.    Diet:  Does not follow low salt diet and uses salt at home.     CorVue thoracic impedance suggesting normal fluid levels with the exception of possible fluid accumulation from 8/11-8/22.   Prescribed: Furosemide  40 mg Take 1 tablet (40 mg total) by mouth daily as needed   Recommendations:  No changes.    Follow-up plan: ICM clinic phone appointment on 10/08/2024.   91 day device clinic remote transmission 10/23/2024.     EP/Cardiology Office Visits:  Recall 06/15/2025 with Dr. Gollan.  Recall 11/06/2025 with Dr Kennyth.    Copy of ICM check sent to Dr. Kennyth.  3 month ICM trend: 08/27/2024.    12-14 Month ICM trend:     Mitzie GORMAN Garner, RN 08/28/2024 4:38 PM

## 2024-09-17 NOTE — Progress Notes (Signed)
 Remote PPM Transmission

## 2024-09-19 ENCOUNTER — Ambulatory Visit: Payer: Self-pay

## 2024-09-19 NOTE — Telephone Encounter (Signed)
 FYI Only or Action Required?: FYI only for provider.  Patient was last seen in primary care on 01/10/2024 by Edman Marsa PARAS, DO.  Called Nurse Triage reporting Urinary Frequency.  Symptoms began a week ago.  Interventions attempted: Rest, hydration, or home remedies.  Symptoms are: gradually worsening.  Triage Disposition: See Physician Within 24 Hours  Patient/caregiver understands and will follow disposition?: Yes  Copied from CRM #[id number]. Topic: Clinical - Red Word Triage >> Sep 19, 2024  4:29 PM Avram MATSU wrote: Red Word that prompted transfer to Nurse Triage: Uti that's getting worse/ no other symptoms. Reason for Disposition  Urinating more frequently than usual (i.e., frequency) OR new-onset of the feeling of an urgent need to urinate (i.e., urgency)  Answer Assessment - Initial Assessment Questions For about a week he has increased urgency and frequency, with only a little coming out. Wife denies fever or pain, just uncomfortable when he voids. No known previous prostate issues. Concern for UTI- appointment made for tomorrow 10/2 and call back/ED precautions understood.    1. SYMPTOM: What's the main symptom you're concerned about? (e.g., frequency, incontinence)     Urinary frequency but only voiding small amounts. 2. ONSET: When did the  frequency  start?     A week ago and it has gotten worse.  3. PAIN: Is there any pain? If Yes, ask: How bad is it? (Scale: 1-10; mild, moderate, severe)     Discomfort  about a 3/10 4. CAUSE: What do you think is causing the symptoms?     unsure 5. OTHER SYMPTOMS: Do you have any other symptoms? (e.g., blood in urine, fever, flank pain, pain with urination)     Denies  Protocols used: Urinary Symptoms-A-AH

## 2024-09-20 ENCOUNTER — Encounter: Payer: Self-pay | Admitting: Family Medicine

## 2024-09-20 ENCOUNTER — Ambulatory Visit: Admitting: Family Medicine

## 2024-09-20 ENCOUNTER — Other Ambulatory Visit: Payer: Self-pay | Admitting: Family Medicine

## 2024-09-20 VITALS — BP 110/70 | HR 80 | Ht 72.0 in | Wt 185.4 lb

## 2024-09-20 DIAGNOSIS — H8113 Benign paroxysmal vertigo, bilateral: Secondary | ICD-10-CM

## 2024-09-20 DIAGNOSIS — F5104 Psychophysiologic insomnia: Secondary | ICD-10-CM

## 2024-09-20 DIAGNOSIS — I25118 Atherosclerotic heart disease of native coronary artery with other forms of angina pectoris: Secondary | ICD-10-CM

## 2024-09-20 DIAGNOSIS — R42 Dizziness and giddiness: Secondary | ICD-10-CM

## 2024-09-20 DIAGNOSIS — N3001 Acute cystitis with hematuria: Secondary | ICD-10-CM

## 2024-09-20 DIAGNOSIS — F411 Generalized anxiety disorder: Secondary | ICD-10-CM

## 2024-09-20 DIAGNOSIS — E039 Hypothyroidism, unspecified: Secondary | ICD-10-CM

## 2024-09-20 DIAGNOSIS — Z0000 Encounter for general adult medical examination without abnormal findings: Secondary | ICD-10-CM

## 2024-09-20 DIAGNOSIS — E7800 Pure hypercholesterolemia, unspecified: Secondary | ICD-10-CM

## 2024-09-20 DIAGNOSIS — R351 Nocturia: Secondary | ICD-10-CM

## 2024-09-20 DIAGNOSIS — F419 Anxiety disorder, unspecified: Secondary | ICD-10-CM | POA: Diagnosis not present

## 2024-09-20 LAB — POCT URINALYSIS DIPSTICK
Bilirubin, UA: NEGATIVE
Glucose, UA: NEGATIVE
Ketones, UA: NEGATIVE
Nitrite, UA: NEGATIVE
Odor: POSITIVE
Protein, UA: NEGATIVE
Spec Grav, UA: 1.015 (ref 1.010–1.025)
Urobilinogen, UA: 0.2 U/dL
pH, UA: 5 (ref 5.0–8.0)

## 2024-09-20 MED ORDER — CEPHALEXIN 500 MG PO CAPS
500.0000 mg | ORAL_CAPSULE | Freq: Three times a day (TID) | ORAL | 0 refills | Status: AC
Start: 2024-09-20 — End: ?

## 2024-09-20 MED ORDER — ALPRAZOLAM 0.25 MG PO TABS: 0.2500 mg | ORAL_TABLET | Freq: Two times a day (BID) | ORAL | 2 refills | Status: AC | PRN

## 2024-09-20 MED ORDER — TRAZODONE HCL 100 MG PO TABS: 100.0000 mg | ORAL_TABLET | Freq: Every day | ORAL | 1 refills | Status: AC

## 2024-09-20 NOTE — Patient Instructions (Addendum)
 Thank you for coming to the office today.  1. You have a Urinary Tract Infection - this is very common, your symptoms are reassuring and you should get better within 1 week on the antibiotics - Start Keflex  500mg  3 times daily for next 7 days, complete entire course, even if feeling better - We sent urine for a culture, we will call you within next few days if we need to change antibiotics - Please drink plenty of fluids, improve hydration over next 1 week  If symptoms worsening, developing nausea / vomiting, worsening back pain, fevers / chills / sweats, then please return for re-evaluation sooner.  If you take AZO OTC - limit this to 2-3 days MAX to avoid affecting kidneys  D-Mannose is a natural supplement that can actually help bind to urinary bacteria and reduce their effectiveness it can help prevent UTI from forming, and may reduce some symptoms. It likely cannot cure an active UTI but it is worth a try and good to prevent them with. Try 500mg  twice a day at a full dose if you want, or check package instructions for more info  --------  Refilled Alprazolam  +2 refills, additional  Refilled Trazodone   1. You have symptoms of Vertigo (Benign Paroxysmal Positional Vertigo) - This is commonly caused by inner ear fluid imbalance, sometimes can be worsened by allergies and sinus symptoms, otherwise it can occur randomly sometimes and we may never discover the exact cause. - To treat this, try the Epley Manuever (see diagrams/instructions below) at home up to 3 times a day for 1-2 weeks or until symptoms resolve  If you develop significant worsening episode with vertigo that does not improve and you get severe headache, loss of vision, arm or leg weakness, slurred speech, or other concerning symptoms please seek immediate medical attention at Emergency Department.  Please schedule a follow-up appointment with Dr Edman within 4 weeks if Vertigo not improving, and will consider Referral  to Vestibular Rehab  See the next page for images describing the Epley Manuever.     ----------------------------------------------------------------------------------------------------------------------        Please schedule a Follow-up Appointment to: Return if symptoms worsen or fail to improve.  If you have any other questions or concerns, please feel free to call the office or send a message through MyChart. You may also schedule an earlier appointment if necessary.  Additionally, you may be receiving a survey about your experience at our office within a few days to 1 week by e-mail or mail. We value your feedback.  Marsa Edman, DO [facility], [place]

## 2024-09-20 NOTE — Progress Notes (Signed)
 Subjective:    Patient ID: Michael Delacruz, male    DOB: 09/27/1941, 83 y.o.   MRN: [id number]  Michael Delacruz is a 83 y.o. male presenting on 09/20/2024 for Urinary Frequency  Patient presents for a same day appointment.  HPI  Discussed the use of AI scribe software for clinical note transcription with the patient, who gave verbal consent to proceed.  History of Present Illness   Michael Delacruz is an 83 year old male who presents with urinary urgency and pressure.  UTI Lower urinary tract symptoms - Urinary urgency and pressure for the past two weeks, with progressive worsening - Sensation characterized as pressure rather than pain - Frequent urge to urinate with minimal urine output - No hematuria - No back pain or flank pain  - History of kidney stones, but current episode lacks significant pain and does not resemble prior stone episodes  Dizziness and vertigo-like symptoms - Occasional episodes of sudden dizziness, similar to previous experiences - History of vertigo-like symptoms evaluated with CT scan and home exercises two years ago  Anxiety - Alprazolam  used as needed for anxiety with success, due for refill  Insomnia - Trazodone  used for sleep, sometimes skipped if alprazolam  taken late in the day         02/03/2024    8:21 AM 01/12/2024   11:10 AM 01/10/2024    2:44 PM  Depression screen PHQ 2/9  Decreased Interest 0 0 3  Down, Depressed, Hopeless 1 0 3  PHQ - 2 Score 1 0 6  Altered sleeping 0 1 3  Tired, decreased energy 1 1 2   Change in appetite 0 0 0  Feeling bad or failure about yourself  0 0 2  Trouble concentrating 0 0 2  Moving slowly or fidgety/restless 0 0 0  Suicidal thoughts 0 0 0  PHQ-9 Score 2 2 15   Difficult doing work/chores Not difficult at all Not difficult at all Extremely dIfficult       01/10/2024    2:44 PM 08/09/2022   10:15 AM 02/24/2022    9:12 AM 02/21/2020   11:14 AM  GAD 7 : Generalized Anxiety Score  Nervous, Anxious,  on Edge 1 3 3 3   Control/stop worrying 2 3 3 3   Worry too much - different things 2 3 3 3   Trouble relaxing 3 3 3 3   Restless 1 1 1 2   Easily annoyed or irritable 2 0 0 0  Afraid - awful might happen 1 3 3 3   Total GAD 7 Score 12 16 16 17   Anxiety Difficulty  Not difficult at all Not difficult at all Not difficult at all    Social History   Tobacco Use   Smoking status: Every Day    Current packs/day: 0.50    Average packs/day: 0.5 packs/day for 65.8 years (32.9 ttl pk-yrs)    Types: Cigarettes    Start date: 1960   Smokeless tobacco: Never   Tobacco comments:    Resumed smoking, after quit 01/2018    Stopped smoking December 21, 2023  Vaping Use   Vaping status: Never Used  Substance Use Topics   Alcohol use: Not Currently    Alcohol/week: 1.0 standard drink of alcohol    Types: 1 Standard drinks or equivalent per week    Comment: occasionally drinks a margarita   Drug use: No    Review of Systems Per HPI unless specifically indicated above     Objective:  BP 110/70 (BP Location: Left Arm, Patient Position: Sitting, Cuff Size: Normal)   Pulse 80   Ht 6' (1.829 m)   Wt 185 lb 6 oz (84.1 kg)   SpO2 97%   BMI 25.14 kg/m   Wt Readings from Last 3 Encounters:  09/20/24 185 lb 6 oz (84.1 kg)  06/15/24 180 lb 12.8 oz (82 kg)  04/03/24 180 lb (81.6 kg)    Physical Exam Vitals and nursing note reviewed.  Constitutional:      General: He is not in acute distress.    Appearance: He is well-developed. He is not diaphoretic.     Comments: Well-appearing, comfortable, cooperative  HENT:     Head: Normocephalic and atraumatic.  Eyes:     General:        Right eye: No discharge.        Left eye: No discharge.     Conjunctiva/sclera: Conjunctivae normal.  Neck:     Thyroid : No thyromegaly.  Cardiovascular:     Rate and Rhythm: Normal rate and regular rhythm.     Pulses: Normal pulses.     Heart sounds: Normal heart sounds. No murmur heard. Pulmonary:     Effort:  Pulmonary effort is normal. No respiratory distress.     Breath sounds: Normal breath sounds. No wheezing or rales.  Musculoskeletal:        General: Normal range of motion.     Cervical back: Normal range of motion and neck supple.  Lymphadenopathy:     Cervical: No cervical adenopathy.  Skin:    General: Skin is warm and dry.     Findings: No erythema or rash.  Neurological:     Mental Status: He is alert and oriented to person, place, and time. Mental status is at baseline.  Psychiatric:        Behavior: Behavior normal.     Comments: Well groomed, good eye contact, normal speech and thoughts     Results for orders placed or performed in visit on 09/20/24  POCT Urinalysis Dipstick   Collection Time: 09/20/24 11:00 AM  Result Value Ref Range   Color, UA yellow    Clarity, UA cloudy    Glucose, UA Negative Negative   Bilirubin, UA negative    Ketones, UA negative    Spec Grav, UA 1.015 1.010 - 1.025   Blood, UA trace    pH, UA 5.0 5.0 - 8.0   Protein, UA Negative Negative   Urobilinogen, UA 0.2 0.2 or 1.0 E.U./dL   Nitrite, UA negative    Leukocytes, UA Trace (A) Negative   Appearance     Odor positive       Assessment & Plan:   Problem List Items Addressed This Visit     Anxiety   Relevant Medications   ALPRAZolam  (XANAX ) 0.25 MG tablet   traZODone  (DESYREL ) 100 MG tablet   Dizziness   Other Visit Diagnoses       Acute cystitis with hematuria    -  Primary   Relevant Medications   cephALEXin  (KEFLEX ) 500 MG capsule   Other Relevant Orders   POCT Urinalysis Dipstick (Completed)   Urine Culture     Psychophysiological insomnia       Relevant Medications   ALPRAZolam  (XANAX ) 0.25 MG tablet   traZODone  (DESYREL ) 100 MG tablet     Benign paroxysmal positional vertigo due to bilateral vestibular disorder            Urinary tract infection Symptoms  and urinalysis indicate UTI. Differential includes cystitis and bladder infection. Previous effective  treatment with Keflex . - Prescribe Keflex  500 mg capsule, take three times daily for seven days. - Encourage increased fluid intake and cranberry juice. - Send urine sample for culture and sensitivity testing. - Advise to report if symptoms persist or worsen after antibiotics.  Vertigo, postural dizziness Chronic problems. Intermittent dizziness likely related to inner ear issues. Previous work up evaluation in past including CT imaging - Provide home exercises Epley for vertigo management. - Discuss meclizine  for temporary relief if needed, recommend exercises first. - He has been evaluated by Neurology/Cardiology in past, we can reconsider work up however at this time symptoms mostly linked to postural changes and positional triggers  Anxiety disorder Managed with alprazolam  as needed. Uses medication sparingly. - Refill alprazolam  0.25 mg with a 60-pill supply and two refills. - Advise to use alprazolam  sparingly to maintain effectiveness.  Insomnia Uses trazodone  100 mg for sleep. - Refill trazodone  100 mg with a 90-day supply and refills. - Advise that trazodone  can be taken sporadically without harm.        Orders Placed This Encounter  Procedures   Urine Culture   POCT Urinalysis Dipstick    Meds ordered this encounter  Medications   ALPRAZolam  (XANAX ) 0.25 MG tablet    Sig: Take 1 tablet (0.25 mg total) by mouth 2 (two) times daily as needed for anxiety.    Dispense:  60 tablet    Refill:  2    Not to exceed 5 additional fills before 07/28/2024   cephALEXin  (KEFLEX ) 500 MG capsule    Sig: Take 1 capsule (500 mg total) by mouth 3 (three) times daily. For 7 days    Dispense:  21 capsule    Refill:  0   traZODone  (DESYREL ) 100 MG tablet    Sig: Take 1 tablet (100 mg total) by mouth at bedtime.    Dispense:  90 tablet    Refill:  1    Follow up plan: Return in about 6 months (around 03/21/2025) for 6 month fasting lab > 1 week later Annual Physical.  Future labs  03/21/25  Marsa Officer, DO [facility] 09/20/2024, 10:46 AM

## 2024-09-21 LAB — URINE CULTURE
MICRO NUMBER:: 17049335
Result:: NO GROWTH
SPECIMEN QUALITY:: ADEQUATE

## 2024-09-24 ENCOUNTER — Ambulatory Visit: Payer: Self-pay | Admitting: Family Medicine

## 2024-10-08 ENCOUNTER — Ambulatory Visit: Attending: Cardiology

## 2024-10-08 DIAGNOSIS — Z950 Presence of cardiac pacemaker: Secondary | ICD-10-CM | POA: Diagnosis not present

## 2024-10-08 DIAGNOSIS — I5020 Unspecified systolic (congestive) heart failure: Secondary | ICD-10-CM

## 2024-10-09 NOTE — Progress Notes (Signed)
 EPIC Encounter for ICM Monitoring  Patient Name: Michael Delacruz is a 83 y.o. male Date: 10/09/2024 Primary Care Physican: Edman Marsa PARAS, DO Primary Cardiologist: Perla Electrophysiologist: Kennyth Pore Pacing: 98%            03/16/2023 Weight: 175-178 lbs 06/27/2023 Weight: 174 lbs 09/20/2024 Office Weight: 185.6 lbs                                                       Transmission results reviewed.    Diet:  Does not follow low salt diet and uses salt at home.     Since 08/27/2024 ICM Remote Transmission: CorVue thoracic impedance suggesting normal fluid levels with the exception of possible fluid accumulation from 09/25/2024-10/01/2024.   Prescribed: Furosemide  40 mg Take 1 tablet (40 mg total) by mouth daily as needed   Recommendations:  No changes.    Follow-up plan: ICM clinic phone appointment on 11/08/2024.   91 day device clinic remote transmission 10/23/2024.     EP/Cardiology Office Visits:  Recall 06/15/2025 with Dr. Gollan.  Recall 11/06/2025 with Dr Kennyth.    Copy of ICM check sent to Dr. Kennyth.  Remote monitoring is medically necessary for Heart Failure Management.    Daily Thoracic Impedance ICM trend: 07/10/2024 through 10/08/2024.    12-14 Month Thoracic Impedance ICM trend:     Mitzie GORMAN Garner, RN 10/09/2024 5:02 PM

## 2024-10-22 ENCOUNTER — Encounter: Payer: Self-pay | Admitting: Radiology

## 2024-10-23 ENCOUNTER — Ambulatory Visit: Payer: Medicare HMO

## 2024-10-23 DIAGNOSIS — I5020 Unspecified systolic (congestive) heart failure: Secondary | ICD-10-CM | POA: Diagnosis not present

## 2024-10-23 LAB — CUP PACEART REMOTE DEVICE CHECK
Battery Remaining Longevity: 25 mo
Battery Remaining Percentage: 30 %
Battery Voltage: 2.95 V
Date Time Interrogation Session: 20251104020031
Implantable Lead Connection Status: 753985
Implantable Lead Connection Status: 753985
Implantable Lead Implant Date: 20190515
Implantable Lead Implant Date: 20190515
Implantable Lead Location: 753858
Implantable Lead Location: 753860
Implantable Lead Model: 5076
Implantable Pulse Generator Implant Date: 20190515
Lead Channel Impedance Value: 510 Ohm
Lead Channel Impedance Value: 580 Ohm
Lead Channel Pacing Threshold Amplitude: 0.5 V
Lead Channel Pacing Threshold Amplitude: 0.75 V
Lead Channel Pacing Threshold Pulse Width: 0.5 ms
Lead Channel Pacing Threshold Pulse Width: 0.5 ms
Lead Channel Sensing Intrinsic Amplitude: 12 mV
Lead Channel Setting Pacing Amplitude: 2 V
Lead Channel Setting Pacing Amplitude: 2.5 V
Lead Channel Setting Pacing Pulse Width: 0.5 ms
Lead Channel Setting Pacing Pulse Width: 0.5 ms
Lead Channel Setting Sensing Sensitivity: 2 mV
Pulse Gen Model: 3562
Pulse Gen Serial Number: 9427031

## 2024-10-24 ENCOUNTER — Ambulatory Visit: Payer: Self-pay | Admitting: Cardiology

## 2024-10-29 NOTE — Progress Notes (Signed)
 Remote PPM Transmission

## 2024-11-08 ENCOUNTER — Ambulatory Visit: Attending: Cardiology

## 2024-11-08 DIAGNOSIS — I5020 Unspecified systolic (congestive) heart failure: Secondary | ICD-10-CM

## 2024-11-08 DIAGNOSIS — Z950 Presence of cardiac pacemaker: Secondary | ICD-10-CM

## 2024-11-08 NOTE — Progress Notes (Signed)
 EPIC Encounter for ICM Monitoring  Patient Name: BERTON BUTRICK is a 83 y.o. male Date: 11/08/2024 Primary Care Physican: Edman Marsa PARAS, DO Primary Cardiologist: Perla Electrophysiologist: Kennyth Pore Pacing: 98%            03/16/2023 Weight: 175-178 lbs 06/27/2023 Weight: 174 lbs 09/20/2024 Office Weight: 185.6 lbs                                                       Transmission results reviewed.    Diet:  Does not follow low salt diet and uses salt at home.     Since 10/08/2024 ICM Remote Transmission: CorVue thoracic impedance suggesting intermittent days with possible fluid accumulation.   Prescribed: Furosemide  40 mg Take 1 tablet (40 mg total) by mouth daily as needed   Recommendations:  No changes.    Follow-up plan: ICM clinic phone appointment on 12/17/2024.   91 day device clinic remote transmission 01/22/2025.     EP/Cardiology Office Visits:  Recall 06/15/2025 with Dr. Gollan.  Recall 11/06/2025 with Dr Kennyth.    Copy of ICM check sent to Dr. Kennyth.   Remote monitoring is medically necessary for Heart Failure Management.    Daily Thoracic Impedance ICM trend: 08/10/2024 through 11/08/2024.    12-14 Month Thoracic Impedance ICM trend:     Mitzie GORMAN Garner, RN 11/08/2024 7:36 AM

## 2024-12-17 ENCOUNTER — Ambulatory Visit: Attending: Cardiology

## 2024-12-17 DIAGNOSIS — Z950 Presence of cardiac pacemaker: Secondary | ICD-10-CM

## 2024-12-17 DIAGNOSIS — I5020 Unspecified systolic (congestive) heart failure: Secondary | ICD-10-CM

## 2024-12-18 NOTE — Progress Notes (Signed)
 EPIC Encounter for ICM Monitoring  Patient Name: Michael Delacruz is a 83 y.o. male Date: 12/18/2024 Primary Care Physican: Edman Marsa PARAS, DO Primary Cardiologist: Perla Electrophysiologist: Kennyth Pore Pacing: 98%            03/16/2023 Weight: 175-178 lbs 06/27/2023 Weight: 174 lbs 09/20/2024 Office Weight: 185.6 lbs                                                       Transmission results reviewed.    Diet:  Does not follow low salt diet and uses salt at home.     Since 11/08/2024 ICM Remote Transmission: CorVue thoracic impedance suggesting normal fluid levels since 11/26/2024.   Prescribed: Furosemide  40 mg Take 1 tablet (40 mg total) by mouth daily as needed   Recommendations:  No changes.    Follow-up plan: ICM clinic phone appointment on 2/4/20265.   91 day device clinic remote transmission 01/22/2025.     EP/Cardiology Office Visits:  Recall 06/15/2025 with Dr. Gollan.  Recall 11/06/2025 with Dr Kennyth.    Copy of ICM check sent to Dr. Kennyth.   Remote monitoring is medically necessary for Heart Failure Management.    Daily Thoracic Impedance ICM trend: 09/18/2024 through 12/17/2024.    12-14 Month Thoracic Impedance ICM trend:     Mitzie GORMAN Garner, RN 12/18/2024 5:25 PM

## 2025-01-17 NOTE — Progress Notes (Signed)
 31 day ICM Remote transmission canceled due to [facility] clinic is on hold until further notice.  91 day remote monitoring will continue per protocol.

## 2025-01-22 ENCOUNTER — Ambulatory Visit: Payer: Medicare HMO

## 2025-01-23 ENCOUNTER — Ambulatory Visit

## 2025-01-23 LAB — CUP PACEART REMOTE DEVICE CHECK
Battery Remaining Longevity: 23 mo
Battery Remaining Percentage: 27 %
Battery Voltage: 2.93 V
Date Time Interrogation Session: 20260203020010
Implantable Lead Connection Status: 753985
Implantable Lead Connection Status: 753985
Implantable Lead Implant Date: 20190515
Implantable Lead Implant Date: 20190515
Implantable Lead Location: 753858
Implantable Lead Location: 753860
Implantable Lead Model: 5076
Implantable Pulse Generator Implant Date: 20190515
Lead Channel Impedance Value: 480 Ohm
Lead Channel Impedance Value: 540 Ohm
Lead Channel Pacing Threshold Amplitude: 0.5 V
Lead Channel Pacing Threshold Amplitude: 0.75 V
Lead Channel Pacing Threshold Pulse Width: 0.5 ms
Lead Channel Pacing Threshold Pulse Width: 0.5 ms
Lead Channel Sensing Intrinsic Amplitude: 11.8 mV
Lead Channel Setting Pacing Amplitude: 2 V
Lead Channel Setting Pacing Amplitude: 2.5 V
Lead Channel Setting Pacing Pulse Width: 0.5 ms
Lead Channel Setting Pacing Pulse Width: 0.5 ms
Lead Channel Setting Sensing Sensitivity: 2 mV
Pulse Gen Model: 3562
Pulse Gen Serial Number: 9427031

## 2025-02-13 ENCOUNTER — Ambulatory Visit

## 2025-03-21 ENCOUNTER — Other Ambulatory Visit

## 2025-03-28 ENCOUNTER — Encounter: Admitting: Family Medicine

## 2025-04-23 ENCOUNTER — Ambulatory Visit: Payer: Medicare HMO

## 2025-07-23 ENCOUNTER — Ambulatory Visit: Payer: Medicare HMO

## 2025-10-22 ENCOUNTER — Ambulatory Visit: Payer: Medicare HMO

## 2026-01-21 ENCOUNTER — Ambulatory Visit: Payer: Medicare HMO

## 2026-04-22 ENCOUNTER — Ambulatory Visit: Payer: Medicare HMO
